# Patient Record
Sex: Female | Born: 1970
Health system: Southern US, Community
[De-identification: ages and names within clinical notes are randomized; demographics above are authoritative.]

## PROBLEM LIST (undated history)

## (undated) ENCOUNTER — Inpatient Hospital Stay (HOSPITAL_COMMUNITY): Payer: 59

## (undated) DIAGNOSIS — Z923 Personal history of irradiation: Secondary | ICD-10-CM

## (undated) DIAGNOSIS — Z800 Family history of malignant neoplasm of digestive organs: Secondary | ICD-10-CM

## (undated) DIAGNOSIS — I10 Essential (primary) hypertension: Secondary | ICD-10-CM

## (undated) DIAGNOSIS — G8929 Other chronic pain: Secondary | ICD-10-CM

## (undated) DIAGNOSIS — Z9221 Personal history of antineoplastic chemotherapy: Secondary | ICD-10-CM

## (undated) DIAGNOSIS — Z806 Family history of leukemia: Secondary | ICD-10-CM

## (undated) DIAGNOSIS — C801 Malignant (primary) neoplasm, unspecified: Secondary | ICD-10-CM

## (undated) HISTORY — DX: Essential (primary) hypertension: I10

## (undated) HISTORY — PX: TUBAL LIGATION: SHX77

## (undated) HISTORY — DX: Family history of leukemia: Z80.6

## (undated) HISTORY — PX: OTHER SURGICAL HISTORY: SHX169

## (undated) HISTORY — DX: Other chronic pain: G89.29

## (undated) HISTORY — PX: CHOLECYSTECTOMY: SHX55

## (undated) HISTORY — DX: Family history of malignant neoplasm of digestive organs: Z80.0

## (undated) HISTORY — PX: BREAST LUMPECTOMY: SHX2

---
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
## 1999-03-30 ENCOUNTER — Emergency Department (HOSPITAL_COMMUNITY): Admission: EM | Admit: 1999-03-30 | Discharge: 1999-03-30 | Payer: Self-pay

## 2001-02-23 ENCOUNTER — Other Ambulatory Visit: Admission: RE | Admit: 2001-02-23 | Discharge: 2001-02-23 | Payer: Self-pay | Admitting: Obstetrics & Gynecology

## 2002-02-26 ENCOUNTER — Inpatient Hospital Stay (HOSPITAL_COMMUNITY): Admission: AD | Admit: 2002-02-26 | Discharge: 2002-02-26 | Payer: Self-pay | Admitting: Obstetrics & Gynecology

## 2002-02-26 ENCOUNTER — Encounter: Payer: Self-pay | Admitting: Obstetrics & Gynecology

## 2002-03-01 ENCOUNTER — Other Ambulatory Visit: Admission: RE | Admit: 2002-03-01 | Discharge: 2002-03-01 | Payer: Self-pay | Admitting: Obstetrics and Gynecology

## 2002-03-01 ENCOUNTER — Other Ambulatory Visit: Admission: RE | Admit: 2002-03-01 | Discharge: 2002-03-01 | Payer: Self-pay | Admitting: Gynecology

## 2002-08-31 ENCOUNTER — Inpatient Hospital Stay (HOSPITAL_COMMUNITY): Admission: AD | Admit: 2002-08-31 | Discharge: 2002-09-02 | Payer: Self-pay | Admitting: Obstetrics & Gynecology

## 2002-08-31 ENCOUNTER — Encounter (INDEPENDENT_AMBULATORY_CARE_PROVIDER_SITE_OTHER): Payer: Self-pay

## 2003-05-14 ENCOUNTER — Other Ambulatory Visit: Admission: RE | Admit: 2003-05-14 | Discharge: 2003-05-14 | Payer: Self-pay | Admitting: Obstetrics & Gynecology

## 2004-05-27 ENCOUNTER — Other Ambulatory Visit: Admission: RE | Admit: 2004-05-27 | Discharge: 2004-05-27 | Payer: Self-pay | Admitting: Obstetrics & Gynecology

## 2005-08-25 ENCOUNTER — Other Ambulatory Visit: Admission: RE | Admit: 2005-08-25 | Discharge: 2005-08-25 | Payer: Self-pay | Admitting: Obstetrics & Gynecology

## 2010-07-11 ENCOUNTER — Ambulatory Visit: Payer: Self-pay | Admitting: Family Medicine

## 2010-07-11 DIAGNOSIS — M79609 Pain in unspecified limb: Secondary | ICD-10-CM

## 2010-07-15 ENCOUNTER — Telehealth (INDEPENDENT_AMBULATORY_CARE_PROVIDER_SITE_OTHER): Payer: Self-pay | Admitting: *Deleted

## 2010-11-12 NOTE — Assessment & Plan Note (Signed)
Summary: R Foot pain x 1 1/2 wks rm 5   Vital Signs:  Patient Profile:   40 Years Old Female CC:      R foot Pain x 1 1/2 wks Height:     62 inches Weight:      214 pounds O2 Sat:      100 % O2 treatment:    Room Air Temp:     98.8 degrees F oral Pulse rate:   73 / minute Pulse rhythm:   regular Resp:     16 per minute (left arm) Cuff size:   regular  Vitals Entered By: Areta Haber CMA (July 11, 2010 11:43 AM)                  Current Allergies: No known allergies History of Present Illness Chief Complaint: R foot Pain x 1 1/2 wks History of Present Illness:  Subjective:  Patient complains of 1.5 week history of pain in right lateral foot, worse when ambulating.  No history of trauma, change in activities, or change in foot wear.  Current Problems: FOOT PAIN, RIGHT (ICD-729.5)   Current Meds NAPROXEN 500 MG TABS (NAPROXEN) One by mouth two times a day pc  REVIEW OF SYSTEMS Constitutional Symptoms      Denies fever, chills, night sweats, weight loss, weight gain, and fatigue.  Eyes       Denies change in vision, eye pain, eye discharge, glasses, contact lenses, and eye surgery. Ear/Nose/Throat/Mouth       Denies hearing loss/aids, change in hearing, ear pain, ear discharge, dizziness, frequent runny nose, frequent nose bleeds, sinus problems, sore throat, hoarseness, and tooth pain or bleeding.  Respiratory       Denies dry cough, productive cough, wheezing, shortness of breath, asthma, bronchitis, and emphysema/COPD.  Cardiovascular       Denies murmurs, chest pain, and tires easily with exhertion.    Gastrointestinal       Denies stomach pain, nausea/vomiting, diarrhea, constipation, blood in bowel movements, and indigestion. Genitourniary       Denies painful urination, kidney stones, and loss of urinary control. Neurological       Denies paralysis, seizures, and fainting/blackouts. Musculoskeletal       Complains of decreased range of motion.       Denies muscle pain, joint pain, joint stiffness, redness, swelling, muscle weakness, and gout.      Comments: R foot pain X 1 1/2 wks Skin       Denies bruising, unusual mles/lumps or sores, and hair/skin or nail changes.  Psych       Denies mood changes, temper/anger issues, anxiety/stress, speech problems, depression, and sleep problems. Other Comments: Pt has not seen anyone for this. Pt does not have PCP. Pt states she has not injuried in any way.   Past History:  Past Medical History: Unremarkable  Past Surgical History: Denies surgical history  Social History: Never Smoked Alcohol use-yes 2-3 mthly Drug use-no Regular exercise-no Smoking Status:  never Drug Use:  no Does Patient Exercise:  no   Objective:  No acute distress  Right ankle:  Full range of motion without tenderness or swelling Right foot:  No swelling, erythema or warmth.  Full range of motion all joints.  Mild tenderness over calcaneus beneath lateral epicondyle.  Distal neurovascular intact   RIGHT FOOT COMPLETE - 3+ VIEW   Comparison: None.   Findings: No osseous abnormality the right foot.  Joint spaces are normal.  No evidence  of fracture or dislocation.  No soft tissue abnormality.   IMPRESSION: No osseous abnormality.   Assessment New Problems: FOOT PAIN, RIGHT (ICD-729.5)  SUSPECT PERONEAL TENDONITIS  Plan New Medications/Changes: NAPROXEN 500 MG TABS (NAPROXEN) One by mouth two times a day pc  #20 x 1, 07/11/2010, Donna Christen MD  New Orders: T-DG Foot Complete*R* [phone] New Patient Level III [99203] Planning Comments:   Begin Naproxen 500mg  two times a day pc.  Obtain well fitting athletic shoes with arch support Begin range of motion exercises ([employer] information and instruction patient handout given)  Follow-up with [facility] if not improving 2 to 3 weeks.   The patient and/or caregiver has been counseled thoroughly with regard to medications  prescribed including dosage, schedule, interactions, rationale for use, and possible side effects and they verbalize understanding.  Diagnoses and expected course of recovery discussed and will return if not improved as expected or if the condition worsens. Patient and/or caregiver verbalized understanding.  Prescriptions: NAPROXEN 500 MG TABS (NAPROXEN) One by mouth two times a day pc  #20 x 1   Entered and Authorized by:   Donna Christen MD   Signed by:   Donna Christen MD on 07/11/2010   Method used:   Print then Give to Patient   RxID:   [id number]   Orders Added: 1)  T-DG Foot Complete*R* [73630] 2)  New Patient Level III [62952]

## 2010-11-12 NOTE — Progress Notes (Signed)
  Phone Note Outgoing Call Call back at [facility] Phone [phone]   Call placed by: Lajean Saver RN,  July 15, 2010 3:52 PM Call placed to: Patient Summary of Call: Call back: No answer, message left with reason for call and call back with any questions

## 2010-11-16 IMAGING — CR DG FOOT COMPLETE 3+V*R*
3 series · 3 of 3 positions shown · non-contrast
Comparison: None.

CLINICAL DATA: Foot pain

RIGHT FOOT COMPLETE - 3+ VIEW

[view not recorded (1 of 3)]
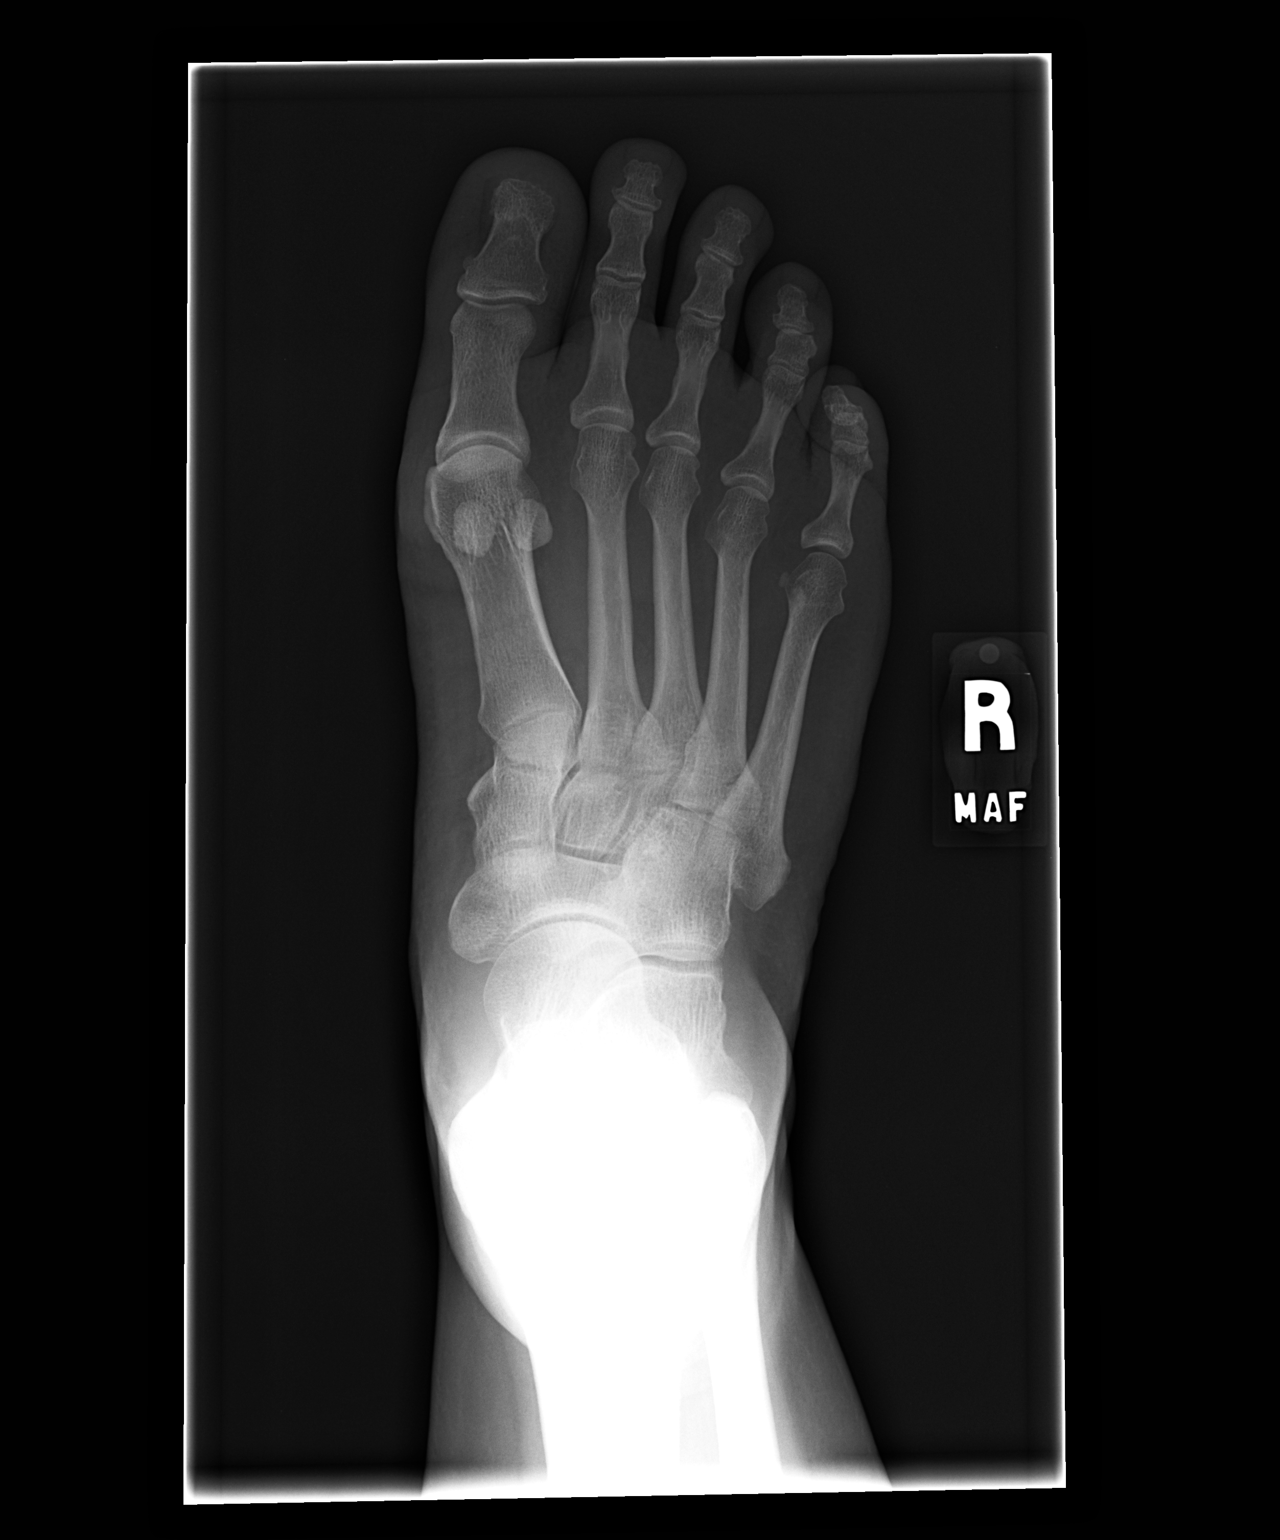

[view not recorded (2 of 3)]
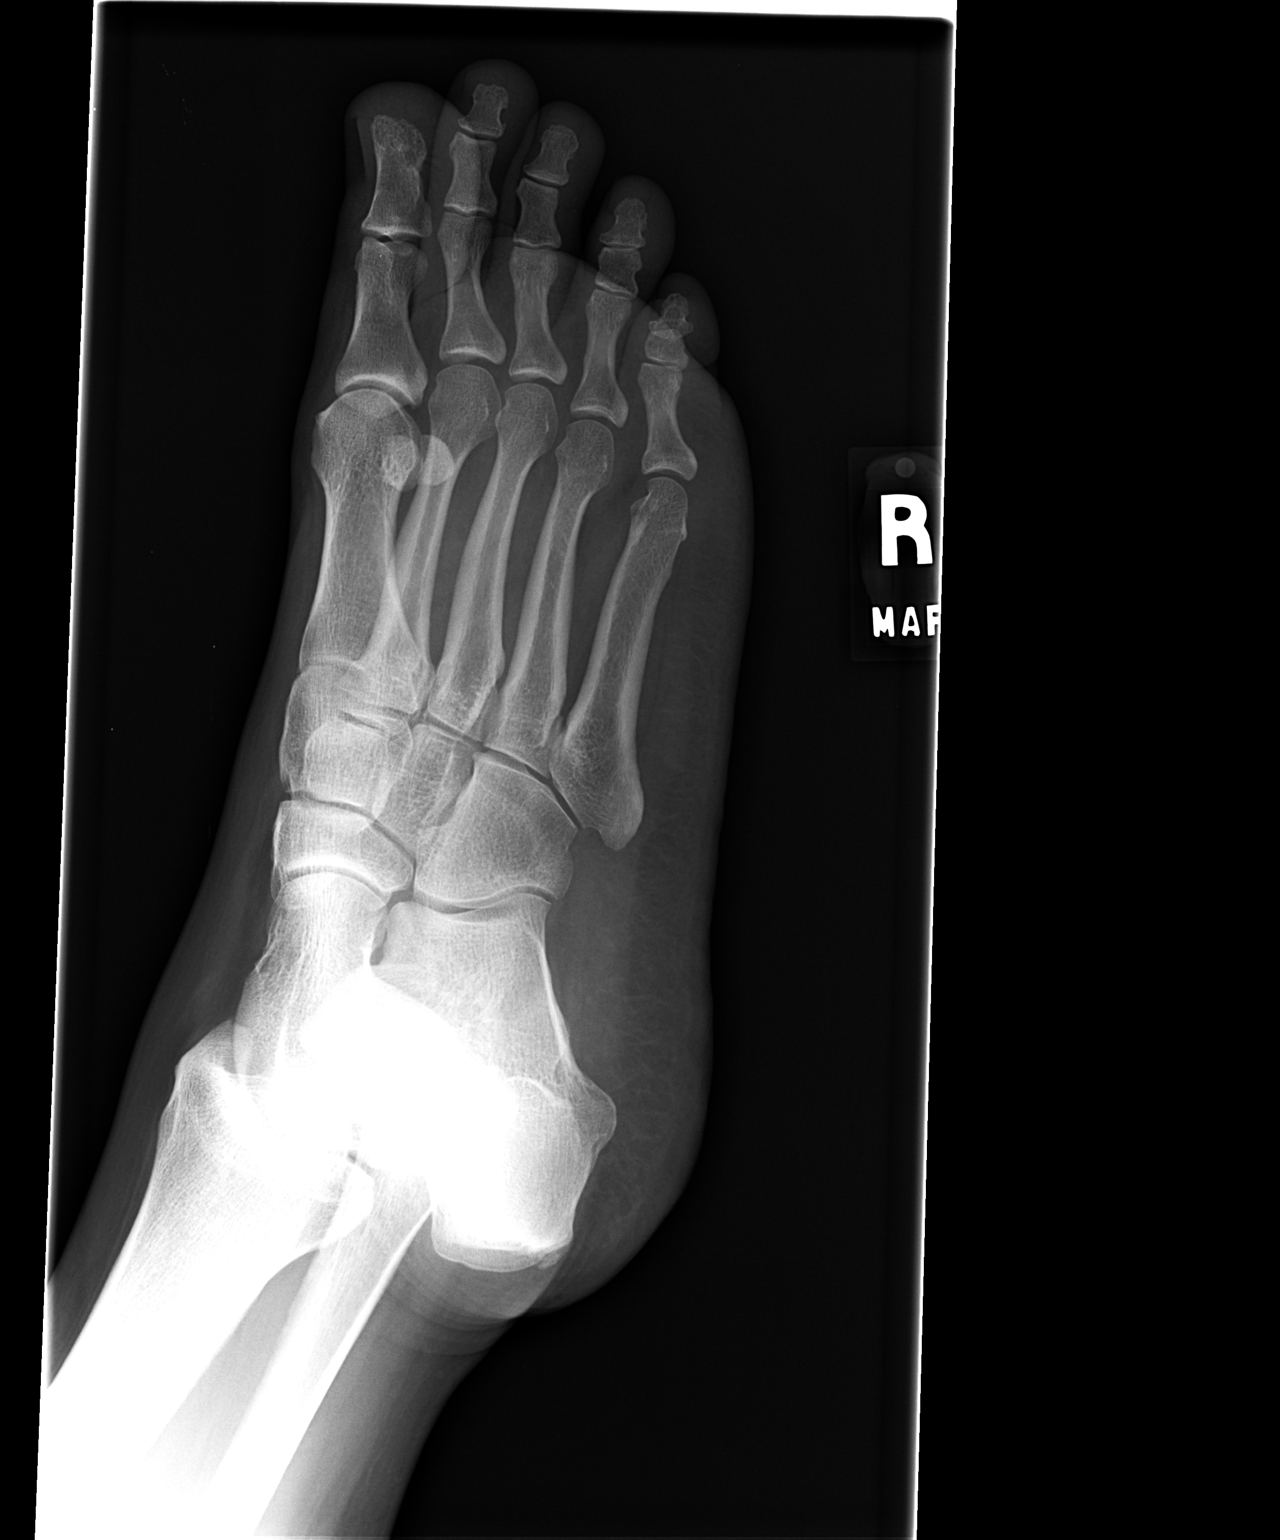

[view not recorded (3 of 3)]
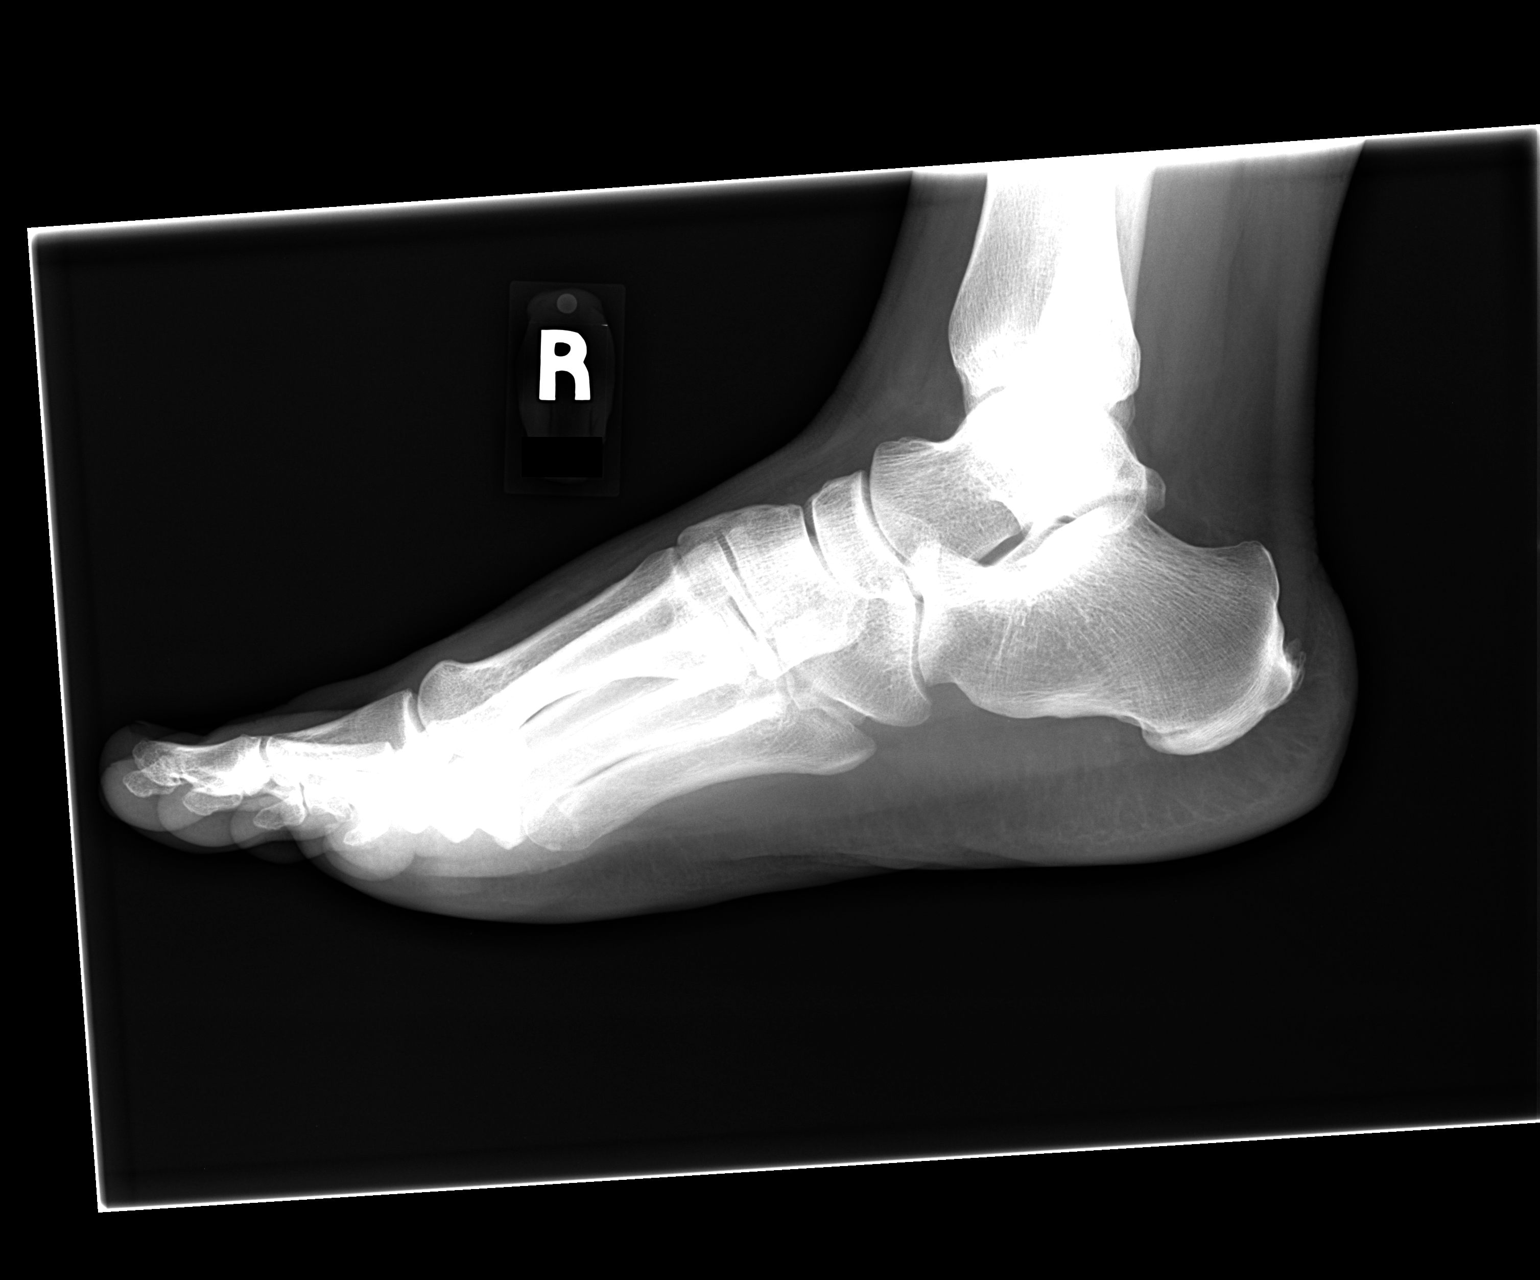

[3 of 3 positions shown; findings below may reference images not displayed]

FINDINGS: No osseous abnormality the right foot.  Joint spaces are
normal.  No evidence of fracture or dislocation.  No soft tissue
abnormality.
IMPRESSION: No osseous abnormality.

## 2011-02-26 NOTE — Discharge Summary (Signed)
   NAME:  Veronica [name], Veronica [name]                          ACCOUNT NO.:  [id number]   MEDICAL RECORD NO.:  [id number]                   PATIENT TYPE:  INP   LOCATION:  9106                                 FACILITY:  [facility]   PHYSICIAN:  Ilda Mori, M.D.                DATE OF BIRTH:  April 02, 1971   DATE OF ADMISSION:  08/31/2002  DATE OF DISCHARGE:  09/02/2002                                 DISCHARGE SUMMARY   FINAL DIAGNOSES:  1. Intrauterine pregnancy at term.  2. History of previous cesarean section and desires repeat cesarean section.  3. Desire for permanent elective sterilization.   PROCEDURE:  Repeat low transverse cesarean section and tubal sterilization  procedure.   SURGEON:  Gerrit Friends. Aldona Bar, M.D.   ASSISTANT:  Carrington Clamp, M.D.   COMPLICATIONS:  None.   HISTORY OF PRESENT ILLNESS:  This 40 year old G4, P0-1-1-1 presents at term  for repeat cesarean section.  The patient had had a history of a repeat  cesarean section performed with her past pregnancy and desires repeat with  this pregnancy, also desires permanent sterilization.   HOSPITAL COURSE:  The patient was taken to the operating room by Dr. Annamaria Helling where a repeat low transverse cesarean section was performed with  delivery of an 8 pound 7 ounce female infant with Apgars of 9 and 9.  At  this point, bilateral tubal ligation was performed.  Delivery and the tubal  ligation went without complications.  The patient's postoperative course was  benign without significant fevers.  The patient was felt ready for discharge  on postoperative day #2.   DIET:  She was sent home on a regular diet.   ACTIVITY:  She was told to decrease activities.   DISCHARGE MEDICATIONS:  She was told to continue prenatal vitamins.  She was  given a prescription for Tylox #25 one to two every four hours as needed for  pain.  She was told to also use some over-the-counter pain medicines.   FOLLOW UP:  She was to follow up in the  office in four weeks.   Upon discharge, the patient's hemoglobin was 12.5.     Leilani Able, P.A.-C.                Ilda Mori, M.D.    MB/MEDQ  D:  10/08/2002  T:  10/08/2002  Job:  [id number]

## 2011-02-26 NOTE — Op Note (Signed)
NAME:  Veronica [name], Veronica [name]                          ACCOUNT NO.:  [id number]   MEDICAL RECORD NO.:  [id number]                   PATIENT TYPE:  INP   LOCATION:  NA                                   FACILITY:  [facility]   PHYSICIAN:  Gerrit Friends. Aldona Bar, M.D.                DATE OF BIRTH:  [birth year]   DATE OF PROCEDURE:  08/31/2002  DATE OF DISCHARGE:                                 OPERATIVE REPORT   PREOPERATIVE DIAGNOSES:  1. Term pregnancy.  2. Previous cesarean section.  3. Desire for repeat cesarean section.  4. Desire for permanent elective sterilization.   POSTOPERATIVE DIAGNOSES:  1. Term pregnancy.  2. Previous cesarean section.  3. Desire for repeat cesarean section.  4. Desire for permanent elective sterilization.  5. Delivery of 8 pound 7 ounce female infant, Apgars 9 and 9.  6. Pathology pending on segment of each fallopian tube.   PROCEDURE:  1. Repeat low transverse cesarean section.  2. Tubal sterilization procedure.   SURGEON:  Gerrit Friends. Aldona Bar, M.D.   ASSISTANT:  Carrington Clamp, M.D.   ANESTHESIA:  Subarachnoid block.   PROCEDURE:  The patient was taken to the operating room where after a  satisfactory induction of subarachnoid block the patient was prepped and  draped having been placed in the supine position slightly tilted to the  left.  A Foley catheter was inserted as part of the prep.   After good documentation of anesthetic levels, a Pfannenstiel incision was  made, dissected down sharply to and through the fascia in a low transverse  fashion with hemostasis created at each layer.  Subfascial space was created  inferiorly and superiorly and muscles separated in the midline, peritoneum  identified and entered appropriately with care taken to avoid the bowel  superiorly and the bladder inferiorly.  There were some filmy adhesions to  the anterior surface of the uterus.   The vesicouterine peritoneum was entered appropriately and the bladder was  pushed off  the lower uterine segment with ease.  Sharp incision into the  lower segment in a low transverse fashion was then made with Metzenbaum  scissors and extended laterally with the fingers.  Amniotomy was produced,  clear fluid, and thereafter delivery of a viable female infant which weighed  subsequently 8 pounds 7 ounces was delivered from a vertex position.  After  the cord was clamped and cut the infant was passed off to the awaiting team.  Subsequent Apgars were noted to be 9 and 9.   Placenta was thereafter delivered intact.  Placenta appeared grossly normal.   At this time the uterus was exteriorized after the filmy adhesions were  lysed.  Once the uterus was exteriorized, was rendered free of any remaining  products of conception.  Good uterine contractility was afforded with slowly  given intravenous Pitocin and manual stimulation.   At this time closure of the uterus  was begun.  The uterine incision was  closed in a single layer of number 1 Vicryl in a running locking fashion  beginning at both angles to the midline.  Hemostasis was noted to be  adequate.  At this time attention was turned to each fallopian tube and  ovary, both of which appeared normal as well.   A segment of the right fallopian tube in its mid portion was elevated and a  free tie of number 1 plain catgut suture tied down about the knuckle and the  knuckle was excised and sent to pathology.  A similar procedure was carried  out on the left fallopian tube.  However, some bleeding was encountered and  this was stopped with a suture of 2-0 Vicryl placed about the bleeding area  on both sides.   At this time the uterus was replaced into the abdominal incision.  The tubal  ligation sites rechecked and noted to be dry.  Uterine incision itself noted  to be dry.  The abdomen was lavaged of all free blood and clot and then  closure of the abdomen was begun in layers.  The abdominoperitoneum was  reapproximated with 0  Vicryl in a running fashion and muscles were secured  with same.   Assured of good subfascial hemostasis, the fascia was reapproximated using 0  Vicryl from angle to midline bilaterally.  Subcutaneous tissue was rendered  hemostatic and then reapproximated with 2-0 plain in interrupted  subcuticular fashion.  Staples were then applied and a sterile pressure  dressing afterwards.  The patient at this time was transported to recovery  room in satisfactory condition having tolerated procedure well.  Estimated  blood loss 800 cc.  All counts correct x2.  Urine output was noted to be  clear and of adequate volume during the procedure.   Once in the recovery room patient was stable.  All counts correct x2.  Pathologic specimen consisted of segment of each fallopian tube.  Baby was  doing well in the nursery.                                               Gerrit Friends. Aldona Bar, M.D.    RMW/MEDQ  D:  08/31/2002  T:  08/31/2002  Job:  [id number]

## 2011-03-09 ENCOUNTER — Other Ambulatory Visit: Payer: Self-pay | Admitting: Obstetrics & Gynecology

## 2011-03-19 ENCOUNTER — Inpatient Hospital Stay (INDEPENDENT_AMBULATORY_CARE_PROVIDER_SITE_OTHER)
Admission: RE | Admit: 2011-03-19 | Discharge: 2011-03-19 | Disposition: A | Discharge status: Home / Self Care | Payer: 59 | Source: Ambulatory Visit | Attending: Family Medicine | Admitting: Family Medicine

## 2011-03-19 ENCOUNTER — Encounter: Payer: Self-pay | Admitting: Family Medicine

## 2011-03-19 DIAGNOSIS — B356 Tinea cruris: Secondary | ICD-10-CM

## 2011-05-10 ENCOUNTER — Encounter (HOSPITAL_COMMUNITY)
Admission: RE | Admit: 2011-05-10 | Discharge: 2011-05-10 | Disposition: A | Discharge status: Home / Self Care | Payer: 59 | Source: Ambulatory Visit | Attending: Obstetrics & Gynecology | Admitting: Obstetrics & Gynecology

## 2011-05-10 ENCOUNTER — Encounter (HOSPITAL_COMMUNITY): Payer: Self-pay

## 2011-05-10 LAB — SURGICAL PCR SCREEN
MRSA, PCR: NEGATIVE
Staphylococcus aureus: NEGATIVE

## 2011-05-10 LAB — CBC
HCT: 39.2 % (ref 36.0–46.0)
Hemoglobin: 12.7 g/dL (ref 12.0–15.0)
MCH: 23.2 pg — ABNORMAL LOW (ref 26.0–34.0)
MCHC: 32.4 g/dL (ref 30.0–36.0)
MCV: 71.7 fL — ABNORMAL LOW (ref 78.0–100.0)
Platelets: 242 K/uL (ref 150–400)
RBC: 5.47 MIL/uL — ABNORMAL HIGH (ref 3.87–5.11)
RDW: 16.5 % — ABNORMAL HIGH (ref 11.5–15.5)
WBC: 8.5 K/uL (ref 4.0–10.5)

## 2011-05-10 NOTE — Anesthesia Preprocedure Evaluation (Signed)
Anesthesia Evaluation  Name, MR# and DOB Patient awake  General Assessment Comment  Reviewed: Allergy & Precautions, H&P , Patient's Chart, lab work & pertinent test results and reviewed documented beta blocker date and time   History of Anesthesia Complications Negative for: history of anesthetic complications  Airway Mallampati: II TM Distance: >3 FB Neck ROM: full    Dental No notable dental hx    Pulmonaryneg pulmonary ROS    clear to auscultation  pulmonary exam normal   Cardiovascular Exercise Tolerance: Good regular Normal   Neuro/PsychNegative Neurological ROS Negative Psych ROS  GI/Hepatic/Renal negative GI ROS, negative Liver ROS, and negative Renal ROS (+)       Endo/Other  Negative Endocrine ROS (+)   Abdominal   Musculoskeletal  Hematology negative hematology ROS (+)   Peds  Reproductive/Obstetrics negative OB ROS   Anesthesia Other Findings             Anesthesia Physical Anesthesia Plan  ASA: I  Anesthesia Plan: General   Post-op Pain Management:    Induction:   Airway Management Planned:   Additional Equipment:   Intra-op Plan:   Post-operative Plan:   Informed Consent: I have reviewed the patients History and Physical, chart, labs and discussed the procedure including the risks, benefits and alternatives for the proposed anesthesia with the patient or authorized representative who has indicated his/her understanding and acceptance.   Dental Advisory Given  Plan Discussed with: CRNA and Surgeon  Anesthesia Plan Comments:         Anesthesia Quick Evaluation  

## 2011-05-10 NOTE — Patient Instructions (Addendum)
20 NEKITA PITA  05/10/2011   Your procedure is scheduled on:  05/17/11  Report to [facility] at 6 AM.  Call this number if you have problems the morning of surgery: [phone]   Remember:   Do not eat food:After Midnight.  Do not drink clear liquids: After Midnight.  Take these medicines the morning of surgery with A SIP OF WATER: NA   Do not wear jewelry, make-up or nail polish.  Do not bring valuables to the hospital.  Contacts, dentures or bridgework may not be worn into surgery.  Leave suitcase in the car. After surgery it may be brought to your room.  For patients admitted to the hospital, checkout time is 11:00 AM the day of discharge.   Patients discharged the day of surgery will not be allowed to drive home.  Name and phone number of your driver: NA  Special Instructions: use CHG wash per written instructions   Please read over the following fact sheets that you were given: Pain Booklet and MRSA Information

## 2011-05-16 ENCOUNTER — Encounter (HOSPITAL_COMMUNITY): Payer: Self-pay | Admitting: Obstetrics & Gynecology

## 2011-05-16 NOTE — H&P (Signed)
Veronica [name] is an 40 y.o. female. G4 Z6109 who is admitted for dysmenorrhea.  She has pain with menses for 5 days each cycle that does not respond to oral contraceptives or NASAIDs and has affected her work and quality of life for the last 6 months.  She is not concerned with fertility (s/p BTL) and wishes to proceed with vaginal hysterectomy   Pertinent Gynecological History: Menses: regular every month without intermenstrual spotting and with severe dysmenorrhea Contraception: tubal ligation DES exposure: denies Blood transfusions: none Sexually transmitted diseases: no past history Previous GYN Procedures: Cesarean x2  Last pap: normal Date: May 2012 OB History: G4, P4022   History reviewed. No pertinent past medical history.  Past Surgical History  Procedure Date  . Cholecystectomy   . Tubal ligation   . C/s x2 '98 '03    History reviewed. No pertinent family history.  Social History:  reports that she has never smoked. She does not have any smokeless tobacco history on file. She reports that she drinks alcohol. She reports that she does not use illicit drugs.  Allergies: No Known Allergies  No prescriptions prior to admission    Review of Systems  All other systems reviewed and are negative.    Ht. 5'1"    Wt. 186    BP 124/76 Physical Exam  Constitutional: She appears well-developed.  HENT:  Head: Normocephalic.  Eyes: Pupils are equal, round, and reactive to light.  Neck: Normal range of motion. No thyromegaly present.  Cardiovascular: Normal rate and normal heart sounds.   Respiratory: Breath sounds normal.  GI: Soft.  Genitourinary: Vagina normal and uterus normal.  Neurological: She is alert.   Assessment/Plan: Severe dysmenorrhea, I suspect adenomyosis.  After failure of non surgical therapies, and consultation about the risks and benefits, the patient requests to proceed with hysterectomy and I concur.  Because of her history of cesarean sections x2 and  her pelvic pain, I will perform laparoscopy prior to vaginal hysterectomy to identify and correct any adhesive disease that will complicate the surgery.  Dontavis Tschantz D 05/16/2011, 9:14 PM

## 2011-05-17 ENCOUNTER — Ambulatory Visit (HOSPITAL_COMMUNITY)
Admission: RE | Admit: 2011-05-17 | Discharge: 2011-05-18 | Disposition: A | Discharge status: Home / Self Care | Payer: 59 | Source: Ambulatory Visit | Attending: Obstetrics & Gynecology | Admitting: Obstetrics & Gynecology

## 2011-05-17 ENCOUNTER — Encounter (HOSPITAL_COMMUNITY)
Admission: RE | Disposition: A | Discharge status: Home / Self Care | Payer: Self-pay | Source: Ambulatory Visit | Attending: Obstetrics & Gynecology

## 2011-05-17 ENCOUNTER — Encounter (HOSPITAL_COMMUNITY): Payer: Self-pay | Admitting: Anesthesiology

## 2011-05-17 ENCOUNTER — Other Ambulatory Visit: Payer: Self-pay | Admitting: Obstetrics & Gynecology

## 2011-05-17 DIAGNOSIS — Z01812 Encounter for preprocedural laboratory examination: Secondary | ICD-10-CM | POA: Insufficient documentation

## 2011-05-17 DIAGNOSIS — N8300 Follicular cyst of ovary, unspecified side: Secondary | ICD-10-CM | POA: Insufficient documentation

## 2011-05-17 DIAGNOSIS — Z01818 Encounter for other preprocedural examination: Secondary | ICD-10-CM | POA: Insufficient documentation

## 2011-05-17 DIAGNOSIS — Z90710 Acquired absence of both cervix and uterus: Secondary | ICD-10-CM

## 2011-05-17 DIAGNOSIS — D259 Leiomyoma of uterus, unspecified: Secondary | ICD-10-CM | POA: Insufficient documentation

## 2011-05-17 DIAGNOSIS — N946 Dysmenorrhea, unspecified: Secondary | ICD-10-CM | POA: Insufficient documentation

## 2011-05-17 HISTORY — PX: LAPAROSCOPIC ASSISTED VAGINAL HYSTERECTOMY: SHX5398

## 2011-05-17 SURGERY — HYSTERECTOMY, VAGINAL, LAPAROSCOPY-ASSISTED
Anesthesia: General | Site: Abdomen | Wound class: Clean Contaminated

## 2011-05-17 MED ORDER — CEFAZOLIN SODIUM 1-5 GM-% IV SOLN
INTRAVENOUS | Status: DC | PRN
  Administered 2011-05-17: 1 g via INTRAVENOUS

## 2011-05-17 MED ORDER — KETOROLAC TROMETHAMINE 30 MG/ML IJ SOLN
30.0000 mg | Freq: Four times a day (QID) | INTRAMUSCULAR | Status: AC
  Administered 2011-05-17 (×2): 30 mg via INTRAVENOUS
  Filled 2011-05-17 (×2): qty 1

## 2011-05-17 MED ORDER — SIMETHICONE 80 MG PO CHEW: 80.0000 mg | CHEWABLE_TABLET | Freq: Four times a day (QID) | ORAL | Status: DC | PRN

## 2011-05-17 MED ORDER — CEFAZOLIN SODIUM 1-5 GM-% IV SOLN: 1.0000 g | INTRAVENOUS | Status: DC

## 2011-05-17 MED ORDER — LACTATED RINGERS IV SOLN
INTRAVENOUS | Status: DC
  Administered 2011-05-17: 07:00:00 via INTRAVENOUS

## 2011-05-17 MED ORDER — MENTHOL 3 MG MT LOZG: 1.0000 | LOZENGE | OROMUCOSAL | Status: DC | PRN

## 2011-05-17 MED ORDER — IBUPROFEN 800 MG PO TABS
800.0000 mg | ORAL_TABLET | Freq: Three times a day (TID) | ORAL | Status: DC | PRN
  Administered 2011-05-18: 800 mg via ORAL
  Filled 2011-05-17: qty 1

## 2011-05-17 MED ORDER — GLYCOPYRROLATE 0.2 MG/ML IJ SOLN
INTRAMUSCULAR | Status: DC | PRN
  Administered 2011-05-17: .2 mg via INTRAVENOUS

## 2011-05-17 MED ORDER — SENNOSIDES-DOCUSATE SODIUM 8.6-50 MG PO TABS: 2.0000 | ORAL_TABLET | Freq: Two times a day (BID) | ORAL | Status: DC | PRN

## 2011-05-17 MED ORDER — NEOSTIGMINE METHYLSULFATE 1 MG/ML IJ SOLN
INTRAMUSCULAR | Status: DC | PRN
  Administered 2011-05-17: 3 mg via INTRAMUSCULAR

## 2011-05-17 MED ORDER — MIDAZOLAM HCL 5 MG/5ML IJ SOLN
INTRAMUSCULAR | Status: DC | PRN
  Administered 2011-05-17: 2 mg via INTRAVENOUS

## 2011-05-17 MED ORDER — GUAIFENESIN 100 MG/5ML PO SOLN: 15.0000 mL | ORAL | Status: DC | PRN

## 2011-05-17 MED ORDER — HYDROMORPHONE HCL 1 MG/ML IJ SOLN: 0.2500 mg | INTRAMUSCULAR | Status: DC | PRN

## 2011-05-17 MED ORDER — LIDOCAINE HCL (CARDIAC) 20 MG/ML IV SOLN
INTRAVENOUS | Status: DC | PRN
  Administered 2011-05-17: 80 mg via INTRAVENOUS

## 2011-05-17 MED ORDER — BUPIVACAINE-EPINEPHRINE 0.5% -1:200000 IJ SOLN
INTRAMUSCULAR | Status: DC | PRN
  Administered 2011-05-17: 8 mL

## 2011-05-17 MED ORDER — ONDANSETRON HCL 4 MG/2ML IJ SOLN: 4.0000 mg | Freq: Four times a day (QID) | INTRAMUSCULAR | Status: DC | PRN

## 2011-05-17 MED ORDER — PSEUDOEPHEDRINE HCL 30 MG PO TABS: 30.0000 mg | ORAL_TABLET | ORAL | Status: DC | PRN

## 2011-05-17 MED ORDER — PROPOFOL 10 MG/ML IV EMUL
INTRAVENOUS | Status: DC | PRN
  Administered 2011-05-17: 200 mg via INTRAVENOUS

## 2011-05-17 MED ORDER — LACTATED RINGERS IR SOLN
Status: DC | PRN
  Administered 2011-05-17: 300 mL

## 2011-05-17 MED ORDER — HYDROMORPHONE HCL 1 MG/ML IJ SOLN
INTRAMUSCULAR | Status: DC | PRN
  Administered 2011-05-17: 1 mg via INTRAVENOUS

## 2011-05-17 MED ORDER — TEMAZEPAM 15 MG PO CAPS: 30.0000 mg | ORAL_CAPSULE | Freq: Every evening | ORAL | Status: DC | PRN

## 2011-05-17 MED ORDER — KETOROLAC TROMETHAMINE 30 MG/ML IJ SOLN
15.0000 mg | Freq: Once | INTRAMUSCULAR | Status: AC | PRN
  Administered 2011-05-17: 30 mg via INTRAVENOUS

## 2011-05-17 MED ORDER — LACTATED RINGERS IV SOLN: INTRAVENOUS | Status: DC

## 2011-05-17 MED ORDER — LACTATED RINGERS IV SOLN
INTRAVENOUS | Status: DC | PRN
  Administered 2011-05-17 (×3): via INTRAVENOUS

## 2011-05-17 MED ORDER — ROCURONIUM BROMIDE 100 MG/10ML IV SOLN
INTRAVENOUS | Status: DC | PRN
  Administered 2011-05-17: 10 mg via INTRAVENOUS
  Administered 2011-05-17: 40 mg via INTRAVENOUS

## 2011-05-17 MED ORDER — ONDANSETRON HCL 4 MG/2ML IJ SOLN
INTRAMUSCULAR | Status: DC | PRN
  Administered 2011-05-17: 4 mg via INTRAVENOUS

## 2011-05-17 MED ORDER — DEXAMETHASONE SODIUM PHOSPHATE 4 MG/ML IJ SOLN
INTRAMUSCULAR | Status: DC | PRN
  Administered 2011-05-17: 10 mg via INTRAVENOUS

## 2011-05-17 MED ORDER — FENTANYL CITRATE 0.05 MG/ML IJ SOLN
INTRAMUSCULAR | Status: DC | PRN
  Administered 2011-05-17: 50 ug via INTRAVENOUS
  Administered 2011-05-17 (×2): 100 ug via INTRAVENOUS

## 2011-05-17 MED ORDER — OXYCODONE-ACETAMINOPHEN 5-325 MG PO TABS
1.0000 | ORAL_TABLET | ORAL | Status: DC | PRN
  Administered 2011-05-17: 1 via ORAL
  Administered 2011-05-17: 2 via ORAL
  Administered 2011-05-17: 1 via ORAL
  Administered 2011-05-18 (×2): 2 via ORAL
  Filled 2011-05-17 (×3): qty 2
  Filled 2011-05-17 (×2): qty 1

## 2011-05-17 SURGICAL SUPPLY — 38 items
APPLIER CLIP ROT 10 11.4 M/L (STAPLE)
CABLE HIGH FREQUENCY MONO STRZ (ELECTRODE) ×2 IMPLANT
CATH ROBINSON RED A/P 16FR (CATHETERS) ×2 IMPLANT
CLIP APPLIE ROT 10 11.4 M/L (STAPLE) IMPLANT
CLOTH BEACON ORANGE TIMEOUT ST (SAFETY) ×2 IMPLANT
CONT PATH 16OZ SNAP LID 3702 (MISCELLANEOUS) ×2 IMPLANT
COVER TABLE BACK 60X90 (DRAPES) ×2 IMPLANT
DECANTER SPIKE VIAL GLASS SM (MISCELLANEOUS) ×2 IMPLANT
DERMABOND ADVANCED (GAUZE/BANDAGES/DRESSINGS) ×2 IMPLANT
DRSG COVADERM PLUS 2X2 (GAUZE/BANDAGES/DRESSINGS) ×2 IMPLANT
ELECT LIGASURE SHORT 9 REUSE (ELECTRODE) ×2 IMPLANT
ELECT REM PT RETURN 9FT ADLT (ELECTROSURGICAL) ×2
ELECTRODE REM PT RTRN 9FT ADLT (ELECTROSURGICAL) ×1 IMPLANT
FORCEPS CUTTING 33CM 5MM (CUTTING FORCEPS) IMPLANT
GLOVE BIOGEL PI IND STRL 6.5 (GLOVE) ×1 IMPLANT
GLOVE BIOGEL PI INDICATOR 6.5 (GLOVE) ×1
GLOVE ECLIPSE 6.0 STRL STRAW (GLOVE) ×4 IMPLANT
GLOVE ECLIPSE 6.5 STRL STRAW (GLOVE) ×2 IMPLANT
GOWN PREVENTION PLUS LG XLONG (DISPOSABLE) ×8 IMPLANT
NEEDLE SPNL 18GX3.5 QUINCKE PK (NEEDLE) ×2 IMPLANT
NS IRRIG 1000ML POUR BTL (IV SOLUTION) ×2 IMPLANT
PACK LAVH (CUSTOM PROCEDURE TRAY) ×2 IMPLANT
SET IRRIG TUBING LAPAROSCOPIC (IRRIGATION / IRRIGATOR) ×2 IMPLANT
SLEEVE Z-THREAD 5X100MM (TROCAR) ×4 IMPLANT
SOLUTION ELECTROLUBE (MISCELLANEOUS) IMPLANT
STRIP CLOSURE SKIN 1/4X3 (GAUZE/BANDAGES/DRESSINGS) IMPLANT
SUT MNCRL 0 MO-4 VIOLET 18 CR (SUTURE) ×3 IMPLANT
SUT MNCRL 0 VIOLET 6X18 (SUTURE) ×1 IMPLANT
SUT MONOCRYL 0 6X18 (SUTURE) ×1
SUT MONOCRYL 0 MO 4 18  CR/8 (SUTURE) ×3
SUT VIC AB 0 CT1 27 (SUTURE) ×2
SUT VIC AB 0 CT1 27XBRD ANBCTR (SUTURE) ×2 IMPLANT
SUT VICRYL 0 UR6 27IN ABS (SUTURE) IMPLANT
SUT VICRYL 4-0 PS2 18IN ABS (SUTURE) ×6 IMPLANT
TOWEL OR 17X24 6PK STRL BLUE (TOWEL DISPOSABLE) ×4 IMPLANT
TRAY FOLEY CATH 14FR (SET/KITS/TRAYS/PACK) ×2 IMPLANT
TROCAR Z-THREAD BLADED 5X100MM (TROCAR) ×2 IMPLANT
WATER STERILE IRR 1000ML POUR (IV SOLUTION) ×2 IMPLANT

## 2011-05-17 NOTE — Anesthesia Postprocedure Evaluation (Signed)
  Anesthesia Post-op Note  Patient: Veronica [name]  Procedure(s) Performed:  LAPAROSCOPIC ASSISTED VAGINAL HYSTERECTOMY - Laparoscopic Assisted Vaginal Hysterectomy With Lysis Of Adhesions  Patient Location: PACU and Women's Unit  Anesthesia Type: General  Level of Consciousness: awake, alert , oriented and patient cooperative  Airway and Oxygen Therapy: Patient Spontanous Breathing  Post-op Pain: pt complained of discomfort left upper thigh, improved with ambulation and massaging, nurse to apply heat.   Post-op Assessment: Post-op Vital signs reviewed, Patient's Cardiovascular Status Stable, No signs of Nausea or vomiting, Adequate PO intake and Pain level controlled  Post-op Vital Signs: Reviewed and stable  Complications: No apparent anesthesia complications

## 2011-05-17 NOTE — Anesthesia Postprocedure Evaluation (Signed)
  Anesthesia Post-op Note  Patient: Veronica [name]  Procedure(s) Performed:  LAPAROSCOPIC ASSISTED VAGINAL HYSTERECTOMY - Laparoscopic Assisted Vaginal Hysterectomy With Lysis Of Adhesions  No anesthesia complications.  Level of consciousness: alert. Cardiopulmonary status stable.  No follow-up care or observation required.  Aliyana Dlugosz L. Rodman Pickle, MD

## 2011-05-17 NOTE — Brief Op Note (Addendum)
        05/17/2011  9:46 AM  PATIENT:  Veronica [name]  40 y.o. female  PRE-OPERATIVE DIAGNOSIS:  dysmenorrhea;possible adenomyosis  POST-OPERATIVE DIAGNOSIS:  Dysmenorrhea; Pelvic adhesions; Possible Adenomyosis  PROCEDURE:  Procedure(s): LAPAROSCOPIC ASSISTED VAGINAL HYSTERECTOMY  SURGEON:  Surgeon(s): Mickel Baas  PHYSICIAN ASSISTANT: Luvenia Redden   ANESTHESIA:   general  ESTIMATED BLOOD LOSS: 200 ml   BLOOD ADMINISTERED:none  DRAINS: none   LOCAL MEDICATIONS USED:  MARCAINE 1:200,000 epi 10 CC  SPECIMEN:  Source of Specimen:  Uterus and Cervix  DISPOSITION OF SPECIMEN:  PATHOLOGY  COUNTS:  YES  TOURNIQUET:  * No tourniquets in log *  DICTATION #: [id number]  PLAN OF CARE: Admit for post op observation  PATIENT DISPOSITION:  PACU - hemodynamically stable.   Delay start of Pharmacological VTE agent (>24hrs) due to surgical blood loss or risk of bleeding:  not applicable

## 2011-05-17 NOTE — Transfer of Care (Signed)
Immediate Anesthesia Transfer of Care Note  Patient: Veronica [name]  Procedure(s) Performed:  LAPAROSCOPIC ASSISTED VAGINAL HYSTERECTOMY - Laparoscopic Assisted Vaginal Hysterectomy With Lysis Of Adhesions  Patient Location: PACU  Anesthesia Type: General  Level of Consciousness: sedated and patient cooperative  Airway & Oxygen Therapy: Patient Spontanous Breathing and Patient connected to nasal cannula oxygen  Post-op Assessment: Report given to PACU RN  Post vital signs: stable  Complications: No apparent anesthesia complications

## 2011-05-17 NOTE — Progress Notes (Signed)
Subjective: Patient reports tolerating PO.    Objective: I have reviewed patient's vital signs and intake and output.  GI: soft, non-tender; bowel sounds normal; no masses,  no organomegaly Vaginal Bleeding: minimal   Assessment/Plan: Observe, Consider discharge tomorrow if stable  LOS: 0 days    Oline Belk D 05/17/2011, 4:59 PM

## 2011-05-18 LAB — CBC
MCH: 23.3 pg — ABNORMAL LOW (ref 26.0–34.0)
MCHC: 32.2 g/dL (ref 30.0–36.0)
Platelets: 187 10*3/uL (ref 150–400)
RBC: 4.86 MIL/uL (ref 3.87–5.11)

## 2011-05-18 MED ORDER — OXYCODONE-ACETAMINOPHEN 5-325 MG PO TABS: 1.0000 | ORAL_TABLET | ORAL | Status: AC | PRN

## 2011-05-18 NOTE — Discharge Summary (Signed)
Physician Discharge Summary  Patient ID: Veronica [name] MRN: [id number] DOB/AGE: 38-Jul-1972 37 y.o.  Admit date: 05/17/2011 Discharge date: 05/18/2011  Admission Diagnoses: Dysmenorrhea  Discharge Diagnoses: Active Problems:  S/P laparoscopic assisted vaginal hysterectomy (LAVH)   Discharged Condition: good  Hospital Course: Parous female, s/p cesarean section x2, s/p tubal sterilization, who suffered with severe dysmenorrhea not responsive to conservative therapies.  Admitted for LAVH.  Surgery and post op course were uncomplicated.  Ready for discharge 24 hours post op.  Consults: none  Significant Diagnostic Studies: Path: pending, Hb. 11.3, WBC 8.8 post op.  Treatments: surgery: LA VH and lysis of uterine adhesions.  Discharge Exam: Blood pressure 138/87, pulse 61, temperature 98.9 F (37.2 C), temperature source Oral, resp. rate 20, height 5\' 2"  (1.575 m), weight 83.915 kg (185 lb), SpO2 100.00%. General appearance: alert and no distress GI: soft, non-tender; bowel sounds normal; no masses,  no organomegaly Incision/Wound:uninflammed  Disposition:   Discharge Orders    Future Orders Please Complete By Expires   Discharge patient        Current Discharge Medication List    START taking these medications   Details  oxyCODONE-acetaminophen (PERCOCET) 5-325 MG per tablet Take 1-2 tablets by mouth every 4 (four) hours as needed (moderate to severe pain when tolerating fluids). Qty: 30 tablet, Refills: 0       Follow-up Information    Follow up with Anquanette Bahner D. Make an appointment in 3 weeks.   Contact information:   [address] [place] [address] [phone]          Signed: Mickel Baas 05/18/2011, 12:53 PM

## 2011-05-18 NOTE — Op Note (Signed)
[name], [name] NO.:  [id number]  MEDICAL RECORD NO.:  [id number]  LOCATION:  9319                          FACILITY:  [facility]  PHYSICIAN:  Ilda Mori, M.D.   DATE OF BIRTH:  12-17-1970  DATE OF PROCEDURE:  05/17/2011 DATE OF DISCHARGE:                              OPERATIVE REPORT   PREOPERATIVE DIAGNOSIS:  Dysmenorrhea.  POSTOPERATIVE DIAGNOSES:  Myoma uteri, uterine adhesions, dysmenorrhea.  PROCEDURE:  Laparoscopically assisted vaginal hysterectomy.  SURGEON:  Ilda Mori, MD  ASSISTANT:  Luvenia Redden, .D  ANESTHESIA:  General endotracheal.  ESTIMATED BLOOD LOSS:  200 mL.  FINDINGS:  Fibrous adhesions from the anterior lower uterine segment to the anterior abdominal wall, enlarged uterus, normal-appearing adnexa bilaterally status post bilateral tubal ligation.  COMPLICATIONS:  None.  PROCEDURE IN DETAIL:  The patient was brought to the operating room and general anesthesia was induced.  She was placed in dorsal lithotomy position, and the abdomen and perineum were prepped and draped in sterile fashion.  The bladder was catheterized, and a Hulka tenaculum was placed in the cervix.  Incision was made in the umbilicus and Veress needle was placed to create a pneumoperitoneum.  A 5-mm trocar was placed, and a 5-mm scope was introduced.  Under direct visualization, a 5-mm trocar was placed through a suprapubic stab wound.  However, adhesions in that area precluded clear penetration.  This trocar was removed and a second 5-mm trocar was placed in the right lower quadrant. The pelvis was viewed, and it was noted that dense adhesions were present from the anterior abdominal wall to the lower uterine segment. These adhesions were burned with unipolar current and cut.  The adnexa were inspected and appeared normal.  Both ovaries appeared to have small follicle cysts.  The surgery was then moved to the vaginal area.  The legs were repositioned  for vaginal surgery.  The cervix was grasped with a Christella Hartigan tenaculum, and the paracervical tissues were infiltrated with a dilute Marcaine solution with 1:200,000 epinephrine.  The cervix was circumcised. and the vaginal mucosa was dissected free.  The posterior cul-de-sac was entered sharply and the uterosacral ligaments were clamped, cut, and ligated.  The base of the cardinal ligaments were also clamped, cut, and ligated.  Attention was turned anteriorly, and the bladder was displaced, but the anterior cul-de-sac could not be entered.  The remainder of the cardinal ligaments and the uterine arteries were clamped, cauterized, and cut using the LigaSure clamp, and this dissection was continued until the anterior cul-de-sac could be brought into view and entered by sharp and blunt dissection.  Once the anterior cul-de-sac was entered, the remainder of the base of the broad ligament was cauterized.  The uterus was large and required coring to deliver.  After the uterus was delivered, the utero-ovarian anastomosis was clamped with the LigaSure clamp, cauterized and cut. This was done bilaterally until the specimen was freed.  Hemostasis on the left side was created with a figure-of-eight pop-off suture.  The posterior cuff was then closed with a running interlocking Vicryl 1 suture.  The cul-de-sac was closed with a pop-off suture that incorporated the uterosacral ligaments bilaterally and  the posterior cul- de-sac.  The anterior peritoneum could not be identified.  The anterior vaginal cuff was closed with figure-of-eight sutures.  The bladder was then catheterized with a Foley catheter and clear urine was obtained. The surgeon regloved.  The laparoscope was then used to view the surgical sites, and there was no bleeding seen.  The procedure was then terminated, and the patient left the operating room in good condition.     Ilda Mori, M.D.     RK/MEDQ  D:  05/17/2011  T:   05/18/2011  Job:  [id number]

## 2011-05-18 NOTE — Progress Notes (Signed)
Post Op Day 1 Subjective: up ad lib, voiding, tolerating PO and + flatus  Objective: Blood pressure 108/67, pulse 84, temperature 97.8 F (36.6 C), temperature source Oral, resp. rate 18, height 5\' 2"  (1.575 m), weight 83.915 kg (185 lb), SpO2 100.00%.  Physical Exam:  General: alert Abdomen: soft, non tender, bowel sounds normal Incision: healing well   Basename 05/18/11 0831  HGB 11.3*  HCT 35.1*    Assessment/Plan: Discharge home   LOS: 1 day   Michail Boyte D 05/18/2011, 9:38 AM

## 2011-06-14 ENCOUNTER — Encounter (HOSPITAL_COMMUNITY): Payer: Self-pay | Admitting: Obstetrics & Gynecology

## 2011-09-13 NOTE — Progress Notes (Signed)
Summary: RASH ON INNER THIGH rm 5   Vital Signs:  Patient Profile:   40 Years Old Female CC:      rash on her inner thigh  and top of pelvic area on and off x Height:     62 inches Weight:      184.75 pounds O2 Sat:      100 % O2 treatment:    Room Air Temp:     99.0 degrees F oral Pulse rate:   64 / minute Resp:     18 per minute BP sitting:   146 / 86  (left arm) Cuff size:   regular  Vitals Entered By: Clemens Catholic LPN (March 19, 2951 9:32 AM)                  Updated Prior Medication List: No Medications Current Allergies (reviewed today): No known allergies History of Present Illness Chief Complaint: rash on her inner thigh  and top of pelvic area on and off x History of Present Illness:  Subjective:  Patient complains of pruritic rash in groin area for about two months, now becoming pruritic.  No response to Neosporin  REVIEW OF SYSTEMS Constitutional Symptoms      Denies fever, chills, night sweats, weight loss, weight gain, and fatigue.  Eyes       Denies change in vision, eye pain, eye discharge, glasses, contact lenses, and eye surgery. Ear/Nose/Throat/Mouth       Denies hearing loss/aids, change in hearing, ear pain, ear discharge, dizziness, frequent runny nose, frequent nose bleeds, sinus problems, sore throat, hoarseness, and tooth pain or bleeding.  Respiratory       Denies dry cough, productive cough, wheezing, shortness of breath, asthma, bronchitis, and emphysema/COPD.  Cardiovascular       Denies murmurs, chest pain, and tires easily with exhertion.    Gastrointestinal       Denies stomach pain, nausea/vomiting, diarrhea, constipation, blood in bowel movements, and indigestion. Genitourniary       Denies painful urination, kidney stones, and loss of urinary control. Neurological       Denies paralysis, seizures, and fainting/blackouts. Musculoskeletal       Denies muscle pain, joint pain, joint stiffness, decreased range of  motion, redness, swelling, muscle weakness, and gout.  Skin       Denies bruising, unusual mles/lumps or sores, and hair/skin or nail changes.  Psych       Denies mood changes, temper/anger issues, anxiety/stress, speech problems, depression, and sleep problems. Other Comments: pt c/o rash on and off x on her inner thighs and top of pelvic area. she thinks this is related to running on a treadmill and her thighs rubbing. she states that she saw her GYN last wk but the rash was gone, it came back again yesterday.    Past History:  Past Medical History: Reviewed history from 07/11/2010 and no changes required. Unremarkable  Past Surgical History:  Caesarean section x 2 Tubal ligation  Family History: mother-  Family History Hypertension COPD  Social History: Reviewed history from 07/11/2010 and no changes required. Never Smoked Alcohol use-yes 1 q 6 mths Drug use-no Regular exercise-no   Objective:  No acute distress  Skin:  Inguinal area:  macular erythema with generally defined margins, satellite lesions.  No vesiculation, tenderness or swelling. Assessment New Problems: TINEA CRURIS (ICD-110.3)   Plan New Medications/Changes: TRIAMCINOLONE ACETONIDE 0.1 % CREA (TRIAMCINOLONE ACETONIDE) Apply thin layer to affected area bid  #  30 x 0, 03/19/2011, Donna Christen MD KETOCONAZOLE 2 % CREA (KETOCONAZOLE) Apply thin layer to affected area two times a day  #45gm x 1, 03/19/2011, Donna Christen MD  New Orders: Est. Patient Level III [id number] Planning Comments:   Begin ketoconazole cream.  Begin triamcinolone cream for 2 to 5 days until itching ceases. Follow-up with PCP if not improving.   The patient and/or caregiver has been counseled thoroughly with regard to medications prescribed including dosage, schedule, interactions, rationale for use, and possible side effects and they verbalize understanding.  Diagnoses and expected course of recovery discussed and will  return if not improved as expected or if the condition worsens. Patient and/or caregiver verbalized understanding.  Prescriptions: TRIAMCINOLONE ACETONIDE 0.1 % CREA (TRIAMCINOLONE ACETONIDE) Apply thin layer to affected area bid  #30 x 0   Entered and Authorized by:   Donna Christen MD   Signed by:   Donna Christen MD on 03/19/2011   Method used:   Print then Give to Patient   RxID:   [id number] KETOCONAZOLE 2 % CREA (KETOCONAZOLE) Apply thin layer to affected area two times a day  #45gm x 1   Entered and Authorized by:   Donna Christen MD   Signed by:   Donna Christen MD on 03/19/2011   Method used:   Print then Give to Patient   RxID:   [id number]   Orders Added: 1)  Est. Patient Level III [29528]

## 2012-03-12 ENCOUNTER — Emergency Department (HOSPITAL_BASED_OUTPATIENT_CLINIC_OR_DEPARTMENT_OTHER): Payer: 59

## 2012-03-12 ENCOUNTER — Emergency Department (HOSPITAL_BASED_OUTPATIENT_CLINIC_OR_DEPARTMENT_OTHER)
Admission: EM | Admit: 2012-03-12 | Discharge: 2012-03-12 | Disposition: A | Discharge status: Home / Self Care | Payer: 59 | Attending: Emergency Medicine | Admitting: Emergency Medicine

## 2012-03-12 ENCOUNTER — Encounter (HOSPITAL_BASED_OUTPATIENT_CLINIC_OR_DEPARTMENT_OTHER): Payer: Self-pay | Admitting: Emergency Medicine

## 2012-03-12 DIAGNOSIS — R21 Rash and other nonspecific skin eruption: Secondary | ICD-10-CM

## 2012-03-12 DIAGNOSIS — M25562 Pain in left knee: Secondary | ICD-10-CM

## 2012-03-12 IMAGING — CR DG KNEE 1-2V*L*
2 series · 2 of 2 positions shown · non-contrast
Comparison: None.

CLINICAL DATA: Knee pain.  No known injury.

LEFT KNEE - 1-2 VIEW

[t knee ap left]
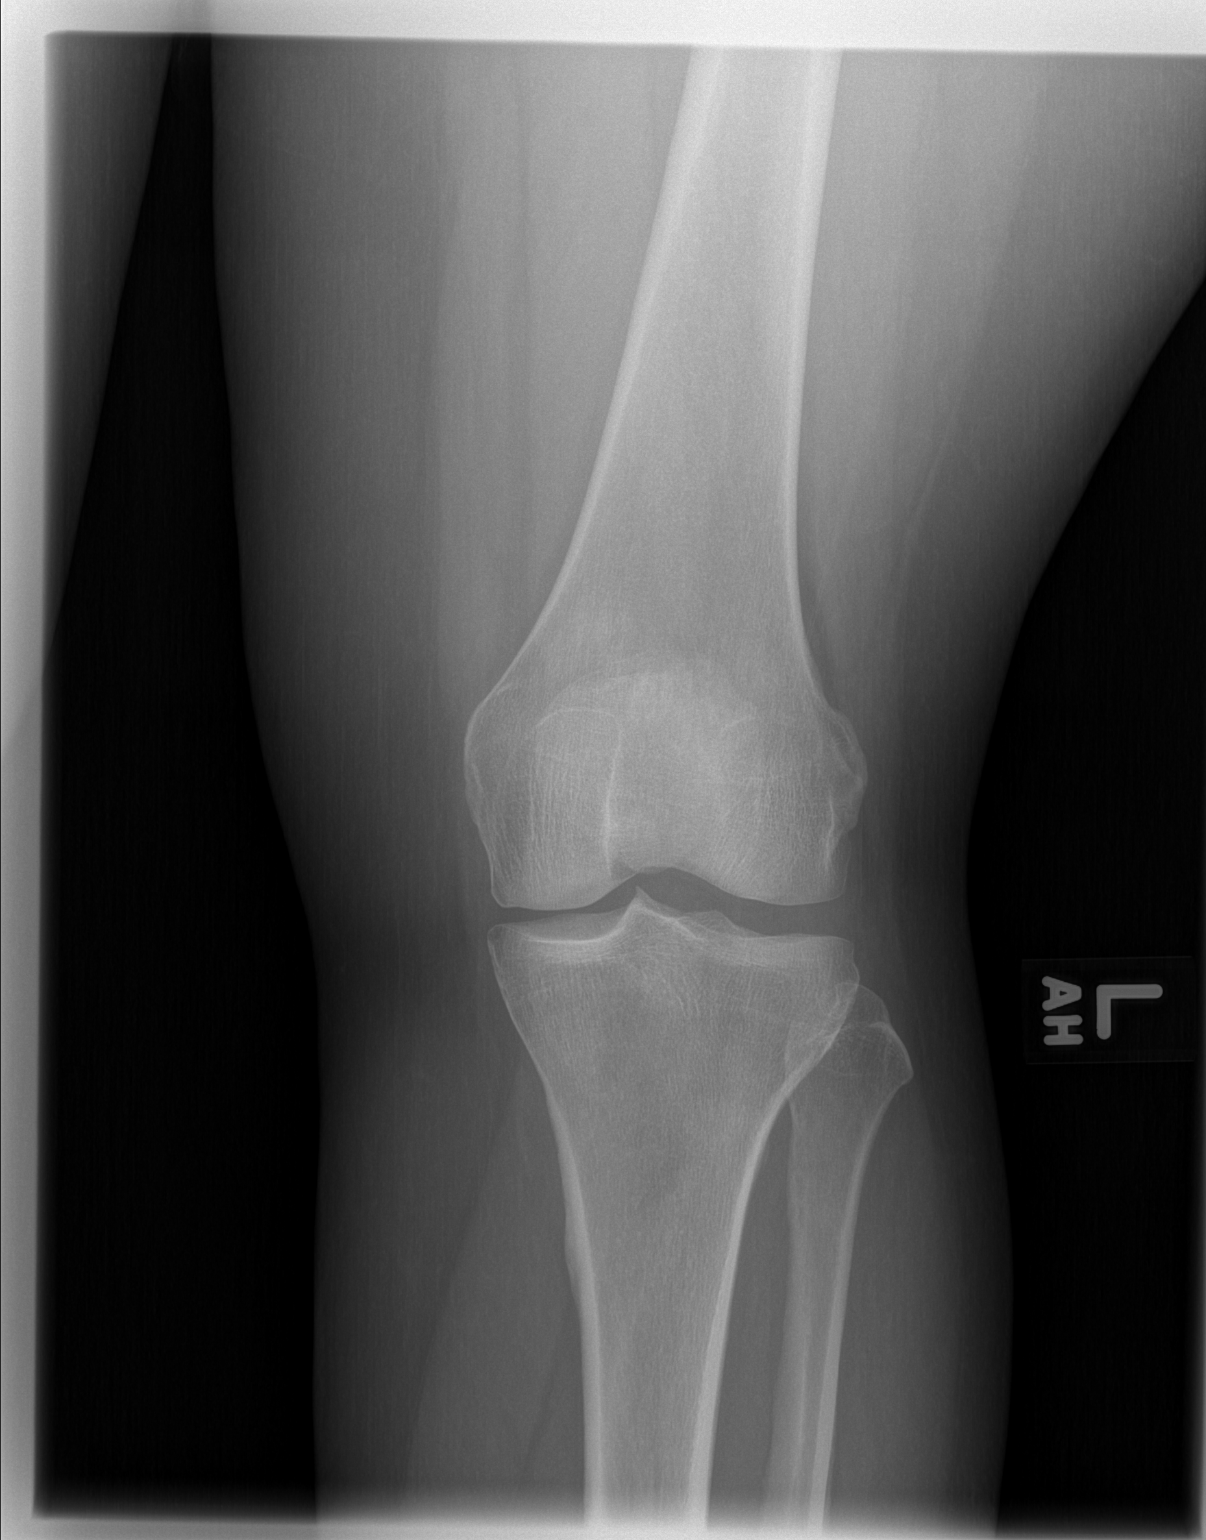

[t knee oblique left]
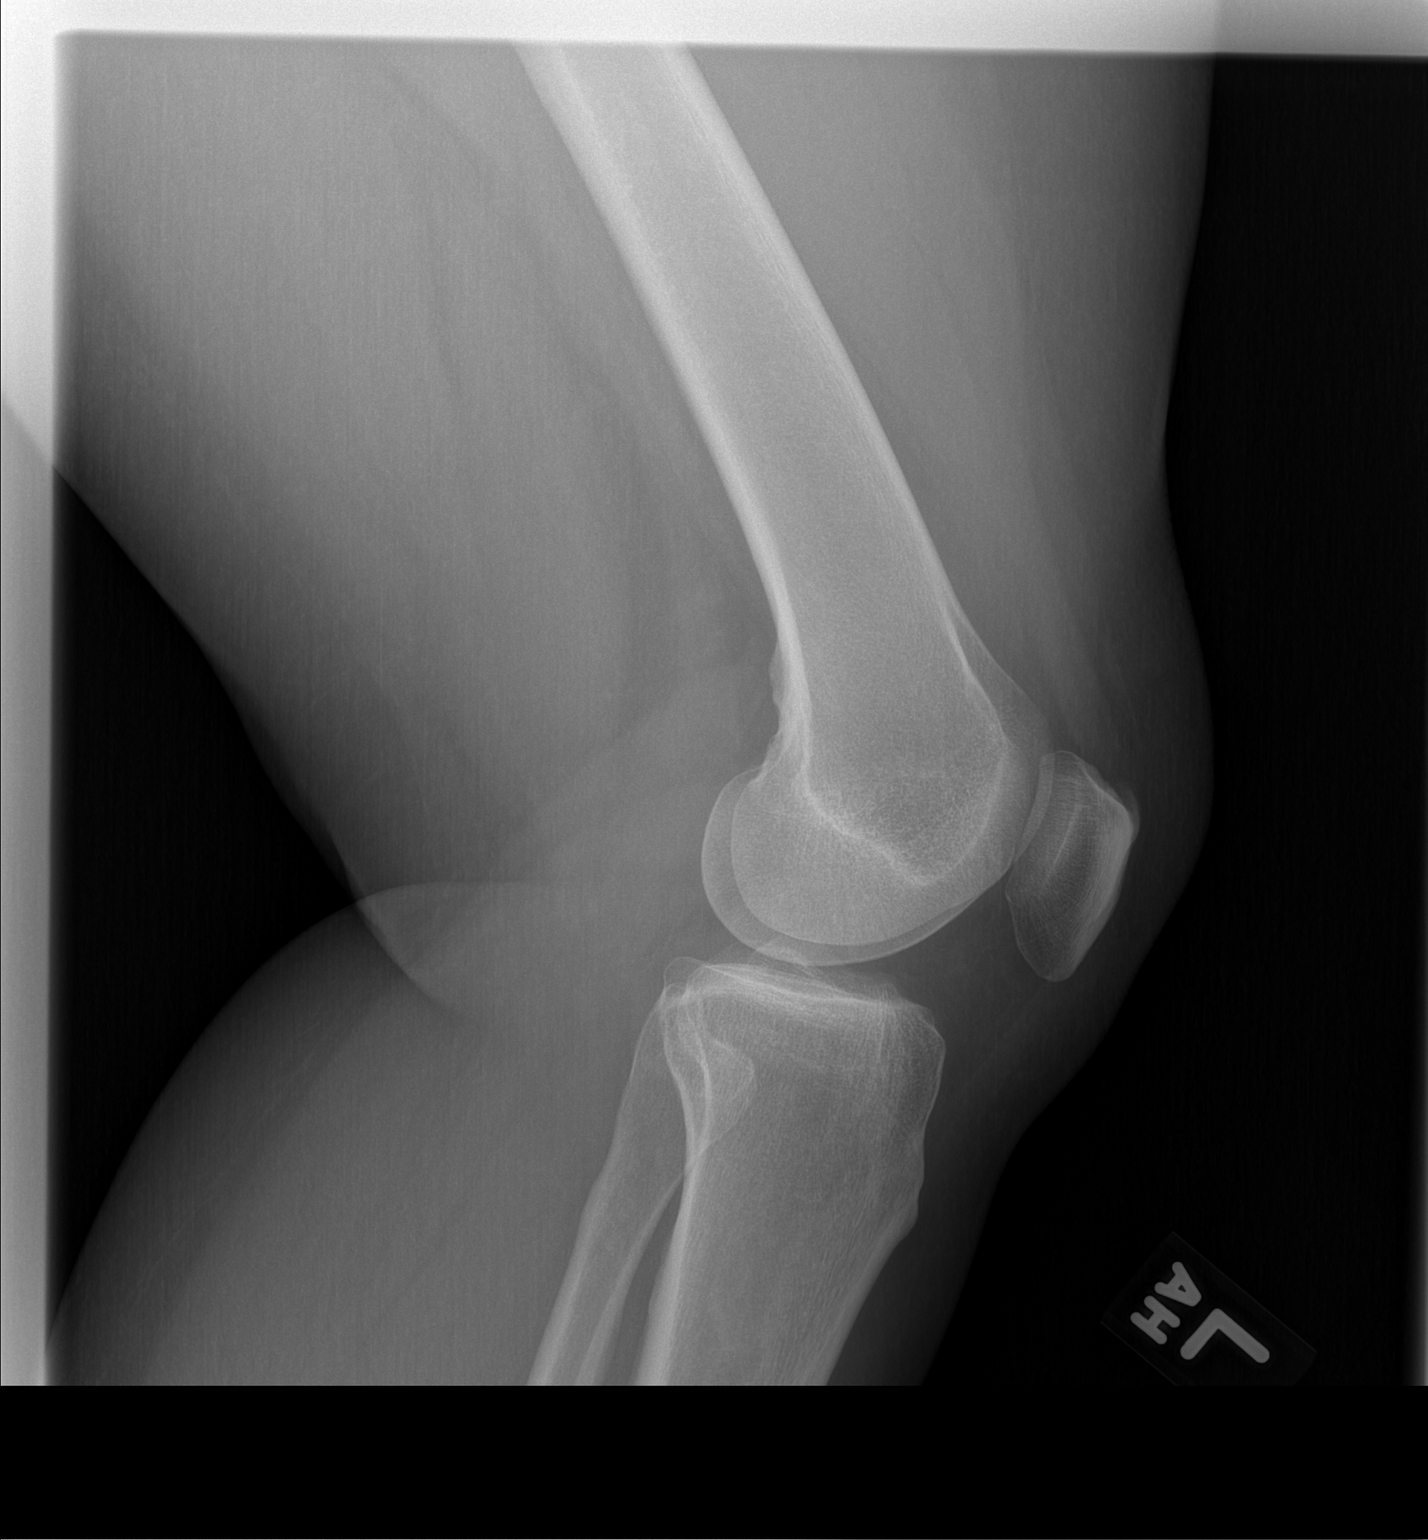

[2 of 2 positions shown; findings below may reference images not displayed]

FINDINGS: Imaged bones, joints and soft tissues appear normal.
IMPRESSION: Negative exam.

## 2012-03-12 NOTE — ED Provider Notes (Signed)
History     CSN: [id number]  Arrival date & time 03/12/12  1505   First MD Initiated Contact with Patient 03/12/12 1757      Chief Complaint  Patient presents with  . Rash  . Knee Pain    (Consider location/radiation/quality/duration/timing/severity/associated sxs/prior treatment) Patient is a 41 y.o. female presenting with rash. The history is provided by the patient. No language interpreter was used.  Rash  Chronicity: 1 week. The problem has not changed since onset.Associated with: pt had a pedicure before rash started. There has been no fever. The rash is present on the right foot, left hand, right hand and left foot.  Pt reports rash is on top of feet and hands.  No feverr, no tick bites.  Pt reports she ran today and has had pain in left knee since.  Pt complains of swelling to her knee.  History reviewed. No pertinent past medical history.  Past Surgical History  Procedure Date  . Cholecystectomy   . Tubal ligation   . C/s x2 '98 '03  . Laparoscopic assisted vaginal hysterectomy 05/17/2011    Procedure: LAPAROSCOPIC ASSISTED VAGINAL HYSTERECTOMY;  Surgeon: Mickel Baas;  Location: [facility];  Service: Gynecology;  Laterality: N/A;  Laparoscopic Assisted Vaginal Hysterectomy With Lysis Of Adhesions  . Cesarean section     No family history on file.  History  Substance Use Topics  . Smoking status: Never Smoker   . Smokeless tobacco: Not on file  . Alcohol Use: Yes     very rarely    OB History    Grav Para Term Preterm Abortions TAB SAB Ect Mult Living                  Review of Systems  Musculoskeletal: Positive for joint swelling.  Skin: Positive for rash.  All other systems reviewed and are negative.    Allergies  Review of patient's allergies indicates no known allergies.  Home Medications   Current Outpatient Rx  Name Route Sig Dispense Refill  . IBUPROFEN 200 MG PO TABS Oral Take 600 mg by mouth every 6 (six) hours as needed. For pain       BP 144/92  Pulse 84  Temp(Src) 98.1 F (36.7 C) (Oral)  Resp 16  SpO2 100%  Physical Exam  Nursing note and vitals reviewed. Constitutional: She is oriented to person, place, and time. She appears well-developed and well-nourished.  HENT:  Head: Normocephalic.  Musculoskeletal: She exhibits edema and tenderness.       Tender left knee, medial aspect,   From nv and ns intact  Neurological: She is alert and oriented to person, place, and time. She has normal reflexes.  Skin: Rash noted.       Red rash dorsal feet and hands. Rash stops at wrist at knees,    Psychiatric: She has a normal mood and affect.    ED Course  Procedures (including critical care time)  Labs Reviewed - No data to display No results found.   No diagnosis found.    MDM  Rash is probably allergic to to something used for pedicure.  Xray no abnormality.   Pt left ams before results.  I asked Charge to notify pt of results of xray.  Pt advised ibuprofen and follow up with her MD        Elson Areas, PA 03/12/12 2051  Lonia Skinner [place], [place] 03/12/12 2051

## 2012-03-12 NOTE — ED Notes (Signed)
Pt c/o rash bil feet x 1 week & LT knee/shin pain since last pm

## 2012-03-12 NOTE — ED Notes (Signed)
Per the request of Verline Lema, [place] called pt with negative xray results.  Pt was very upset and angry that she had not been seen sooner "which is why I left"  Informed pt that xray was negagtive with no obvious fractures  Pt stated "I know its not broken otherwise I couldn't walk on it and I am concerned about the pain and the rash"  Spoke with K. Sofia about pt and she recommended follow up with PCP and Motrin 600-800mg  every 6-8 hours as needed.  Relayed information to pt.

## 2012-03-15 NOTE — ED Provider Notes (Signed)
Medical screening examination/treatment/procedure(s) were performed by non-physician practitioner and as supervising physician I was immediately available for consultation/collaboration.   Gwyneth Sprout, MD 03/15/12 1800

## 2012-03-18 ENCOUNTER — Emergency Department (INDEPENDENT_AMBULATORY_CARE_PROVIDER_SITE_OTHER)
Admission: EM | Admit: 2012-03-18 | Discharge: 2012-03-18 | Disposition: A | Discharge status: Home / Self Care | Payer: 59 | Source: Home / Self Care

## 2012-03-18 ENCOUNTER — Encounter: Payer: Self-pay | Admitting: *Deleted

## 2012-03-18 DIAGNOSIS — M222X2 Patellofemoral disorders, left knee: Secondary | ICD-10-CM

## 2012-03-18 DIAGNOSIS — M25562 Pain in left knee: Secondary | ICD-10-CM

## 2012-03-18 DIAGNOSIS — M25569 Pain in unspecified knee: Secondary | ICD-10-CM

## 2012-03-18 MED ORDER — KETOROLAC TROMETHAMINE 60 MG/2ML IM SOLN
60.0000 mg | Freq: Once | INTRAMUSCULAR | Status: AC
  Administered 2012-03-18: 60 mg via INTRAMUSCULAR

## 2012-03-18 NOTE — Discharge Instructions (Signed)
Patient information: Patellofemoral pain syndrome (The Basics) View in SpanishWritten by the doctors and editors at [employer]   What is patellofemoral pain syndrome? -- Patellofemoral pain syndrome is a condition that causes pain in the front of the knee. It involves the knee cap, which doctors call the "patella" (figure 1).  Many times, patellofemoral pain syndrome happens in runners or other people who put a lot of pressure on their knees. But the condition can also happen when a person's knee cap gets out of line with the knee joint.  What are the symptoms of patellofemoral pain syndrome? -- Patellofemoral pain syndrome causes pain in the front of the knee, or around or behind the knee cap. The pain can come on slowly or quickly. The pain is usually worse when people squat, run, or sit for a long time. Some people might also feel as if their knee is giving out.  Is there a test for patellofemoral pain syndrome? -- No test can tell for sure if you have patellofemoral pain syndrome. But your doctor or nurse should be able to tell if you have the condition by learning about your symptoms and doing an exam.  To make sure your symptoms aren't caused by another condition, your doctor might order an X-ray or imaging test of your knee. Imaging tests create pictures of the inside of the body.  How is patellofemoral pain syndrome treated? -- Treatment usually involves a few parts.  The first part of treatment helps to reduce your pain. It can include:  ?Resting your knee and avoiding activities or movements that make the pain worse  ?Taking nonsteroidal antiinflammatory drugs, also called "NSAIDs" - NSAIDs are a large group of medicines that includes ibuprofen (sample brand names: Advil, Motrin) and naproxen (sample brand names: Aleve, Naprosyn).  ?Putting ice on your knee when it hurts or after activities that cause pain - You can put a cold gel pack, bag of ice, or bag of frozen vegetables on the painful area every 1 to 2  hours, for 15 minutes each time. Put a thin towel between the ice (or other cold object) and your skin.  Another part of treatment involves doing exercises to strengthen the muscles around your knee. Your doctor or nurse will show you which exercises to do, or he or she will have you work with a physical therapist (exercise expert).  Your doctor might also recommend that you:  ?Wear a knee brace to support your knee.  ?Tape up your knee in a certain way to support your knee.  ?Wear special shoe inserts made to fit your foot (to keep your foot from turning in or out too much).  Your symptoms will most likely improve with treatment. But if they don't, your doctor might have you see a knee specialist to discuss treating your condition with surgery.  How can I prevent getting patellofemoral pain syndrome again? -- You can reduce your chances of getting this condition again by doing the exercises and stretches that your doctor or physical therapist shows you.

## 2012-03-18 NOTE — ED Notes (Signed)
Pt has had L knee and calf pain for 6 days.  She was treated for a rash on her leg and hands on Monday but the pain and swelling in her L knee is worsening.

## 2012-03-18 NOTE — ED Provider Notes (Signed)
Pt seen by Dr. Alvester Morin for PFPS  Marlaine Hind, MD 03/18/12 [phone]

## 2012-03-18 NOTE — ED Provider Notes (Signed)
History     CSN: [id number]  Arrival date & time 03/18/12  1140   First MD Initiated Contact with Patient 03/18/12 1212      Chief Complaint  Patient presents with  . Leg Pain    L    (Consider location/radiation/quality/duration/timing/severity/associated sxs/prior treatment) HPI Comments: L knee pain x 1 week.  Started running 3 weeks ago.  Went to ED earlier in the week for knee pain.  Had xrays taken-WNL. Knee pain worse with weight bearing and knee bending.  Knee pain primarily anterior on patellar tendon.  Has had some mild medial knee swelling.  No distal parasthesias.  Has some L ankle swelling and rash that she saw her PCP for.  Had LE U/S negative for DVT per pt.  Currently being treated with doxy for ankle rash.   Patient is a 41 y.o. female presenting with knee pain.  Knee Pain This is a new problem. The current episode started more than 1 week ago. The problem occurs constantly. The problem has not changed since onset.Pertinent negatives include no chest pain, no abdominal pain, no headaches and no shortness of breath. Exacerbated by: knee bending and running.  The symptoms are relieved by rest. She has tried nothing for the symptoms.    History reviewed. No pertinent past medical history.  Past Surgical History  Procedure Date  . Cholecystectomy   . Tubal ligation   . C/s x2 '98 '03  . Laparoscopic assisted vaginal hysterectomy 05/17/2011    Procedure: LAPAROSCOPIC ASSISTED VAGINAL HYSTERECTOMY;  Surgeon: Mickel Baas;  Location: [facility];  Service: Gynecology;  Laterality: N/A;  Laparoscopic Assisted Vaginal Hysterectomy With Lysis Of Adhesions  . Cesarean section     History reviewed. No pertinent family history.  History  Substance Use Topics  . Smoking status: Never Smoker   . Smokeless tobacco: Not on file  . Alcohol Use: Yes     very rarely    OB History    Grav Para Term Preterm Abortions TAB SAB Ect Mult Living                  Review  of Systems  Respiratory: Negative for shortness of breath.   Cardiovascular: Negative for chest pain.  Gastrointestinal: Negative for abdominal pain.  Neurological: Negative for headaches.  All other systems reviewed and are negative.    Allergies  Review of patient's allergies indicates no known allergies.  Home Medications   Current Outpatient Rx  Name Route Sig Dispense Refill  . DOXYCYCLINE HYCLATE 100 MG PO CPEP Oral Take 100 mg by mouth 2 (two) times daily.    . IBUPROFEN 200 MG PO TABS Oral Take 600 mg by mouth every 6 (six) hours as needed. For pain      BP 157/87  Pulse 61  Temp(Src) 97.8 F (36.6 C) (Oral)  Resp 18  Ht 5\' 1"  (1.549 m)  Wt 188 lb (85.276 kg)  BMI 35.52 kg/m2  SpO2 100%  Physical Exam  Constitutional: She appears well-developed and well-nourished.       Mildly obese.  HENT:  Head: Normocephalic and atraumatic.  Eyes: Conjunctivae are normal. Pupils are equal, round, and reactive to light.  Neck: Normal range of motion. Neck supple.  Cardiovascular: Normal rate and regular rhythm.   Pulmonary/Chest: Effort normal and breath sounds normal.  Musculoskeletal:       Legs:   ED Course  Procedures (including critical care time)  Labs Reviewed - No data to display  No results found.   1. Patellofemoral syndrome, left       MDM  Likely patellofemoral syndrome in a new runner.  Knee sleeve.  RICE.  Follow up with sports medicine in 2-3 weeks.  Handout given.  Follow up as needed.     The patient and/or caregiver has been counseled thoroughly with regard to treatment plan and/or medications prescribed including dosage, schedule, interactions, rationale for use, and possible side effects and they verbalize understanding. Diagnoses and expected course of recovery discussed and will return if not improved as expected or if the condition worsens. Patient and/or caregiver verbalized understanding.             Floydene Flock,  MD 03/18/12 1239

## 2016-04-06 ENCOUNTER — Encounter: Payer: Self-pay | Admitting: Pediatrics

## 2018-10-11 DIAGNOSIS — C801 Malignant (primary) neoplasm, unspecified: Secondary | ICD-10-CM

## 2018-10-11 HISTORY — DX: Malignant (primary) neoplasm, unspecified: C80.1

## 2019-04-27 ENCOUNTER — Other Ambulatory Visit: Payer: Self-pay | Admitting: Obstetrics & Gynecology

## 2019-04-27 DIAGNOSIS — R928 Other abnormal and inconclusive findings on diagnostic imaging of breast: Secondary | ICD-10-CM

## 2019-05-01 ENCOUNTER — Other Ambulatory Visit: Payer: Self-pay

## 2019-05-01 ENCOUNTER — Other Ambulatory Visit: Payer: Self-pay | Admitting: Obstetrics & Gynecology

## 2019-05-01 ENCOUNTER — Ambulatory Visit
Admission: RE | Admit: 2019-05-01 | Discharge: 2019-05-01 | Disposition: A | Discharge status: Home / Self Care | Payer: 59 | Source: Ambulatory Visit | Attending: Obstetrics & Gynecology | Admitting: Obstetrics & Gynecology

## 2019-05-01 DIAGNOSIS — N6310 Unspecified lump in the right breast, unspecified quadrant: Secondary | ICD-10-CM

## 2019-05-01 DIAGNOSIS — R928 Other abnormal and inconclusive findings on diagnostic imaging of breast: Secondary | ICD-10-CM

## 2019-05-01 IMAGING — US ULTRASOUND RIGHT BREAST LIMITED
1 series · 13 of 14 positions shown · non-contrast
Comparison: Previous exam(s).

CLINICAL DATA: The patient was called back for a new spiculated
mass on the right.

EXAM:
DIGITAL DIAGNOSTIC RIGHT MAMMOGRAM WITH TOMO
ULTRASOUND RIGHT BREAST

[Series 1: ultrasound right breast limited · 0.07mm/px · 13 of 14 slices shown]
[im 1/14]
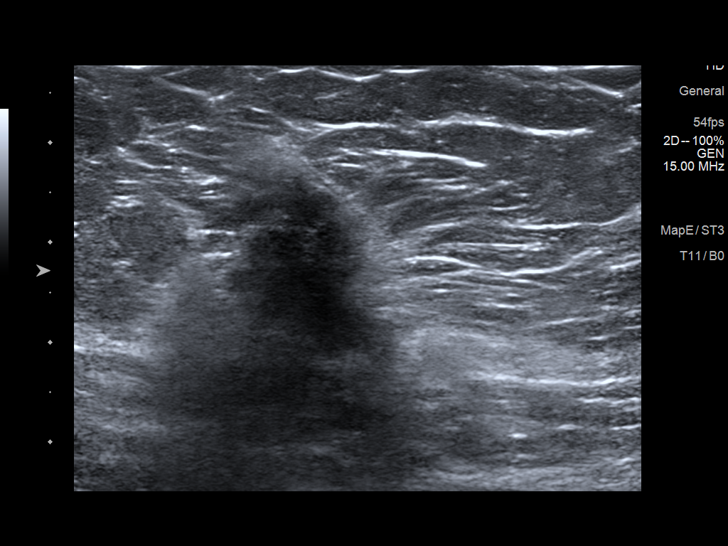
[im 2/14]
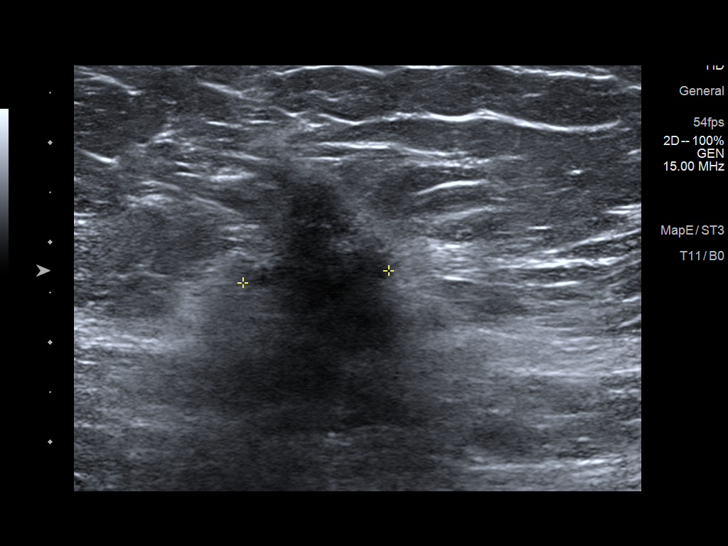
[im 3/14]
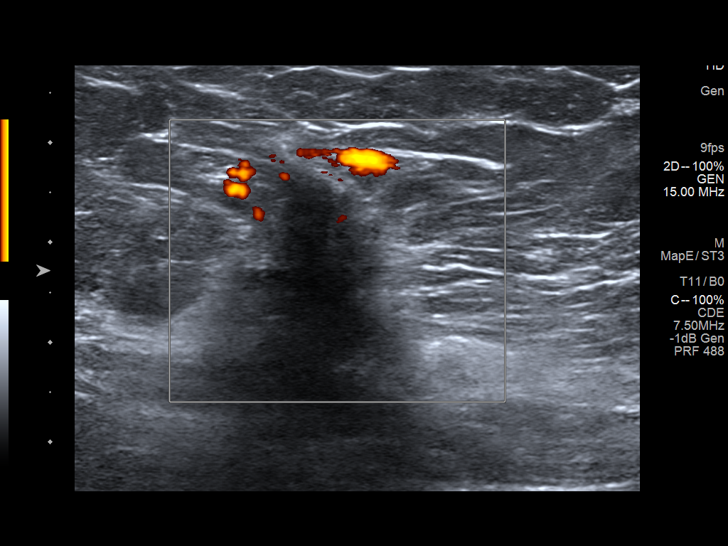
[im 4/14]
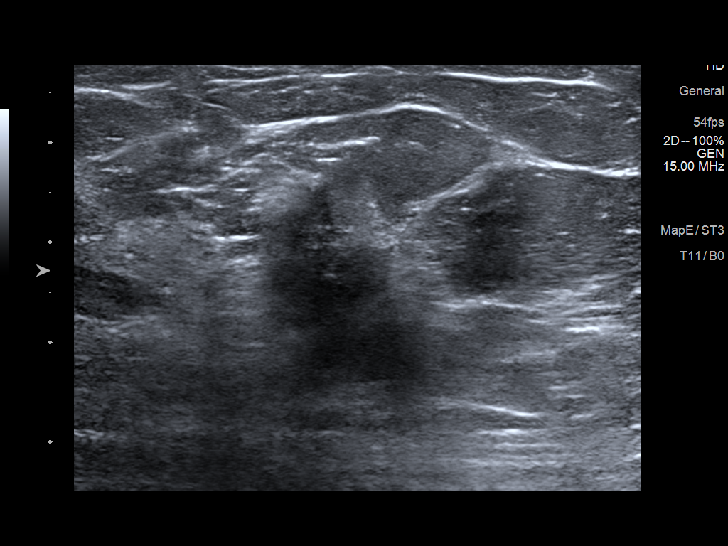
[im 5/14]
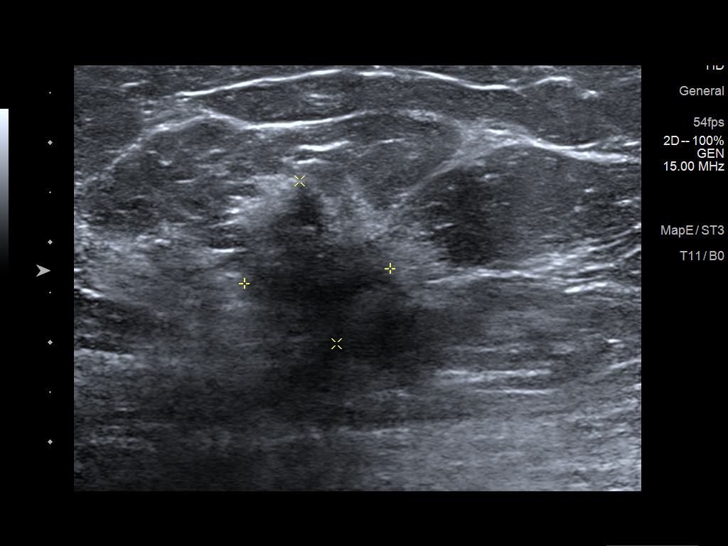
[im 6/14]
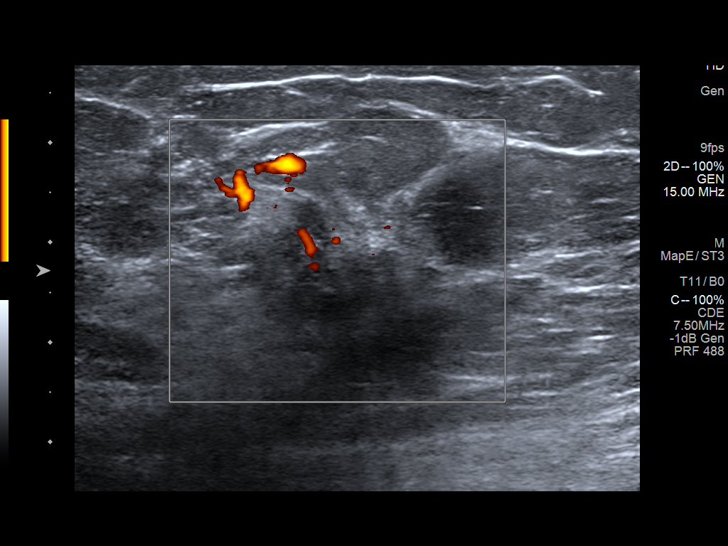
[im 8/14]
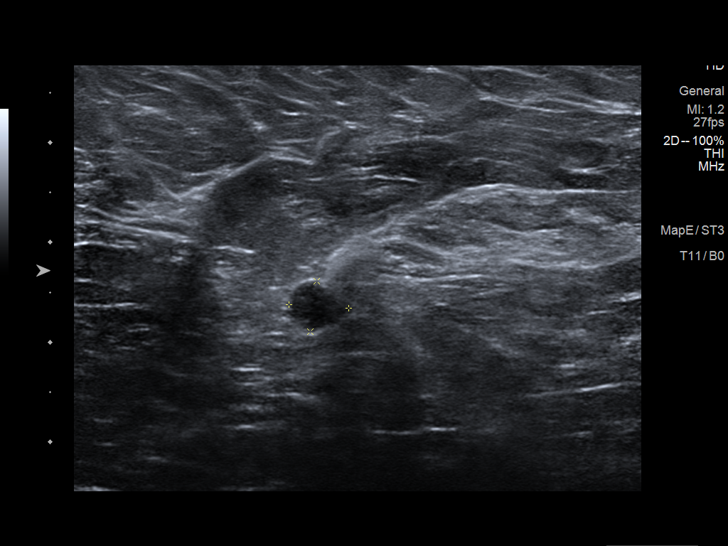
[im 9/14]
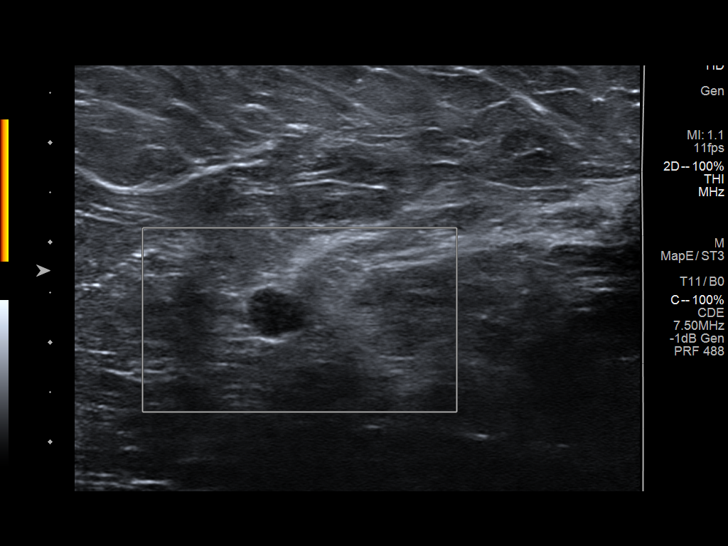
[im 10/14]
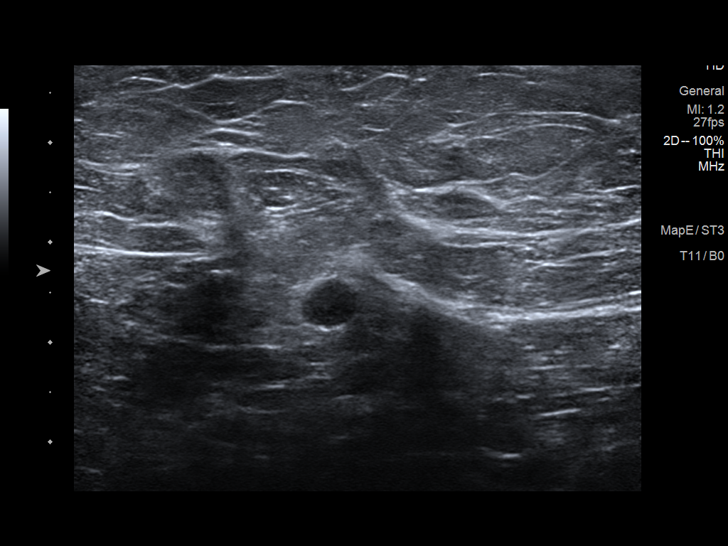
[im 11/14]
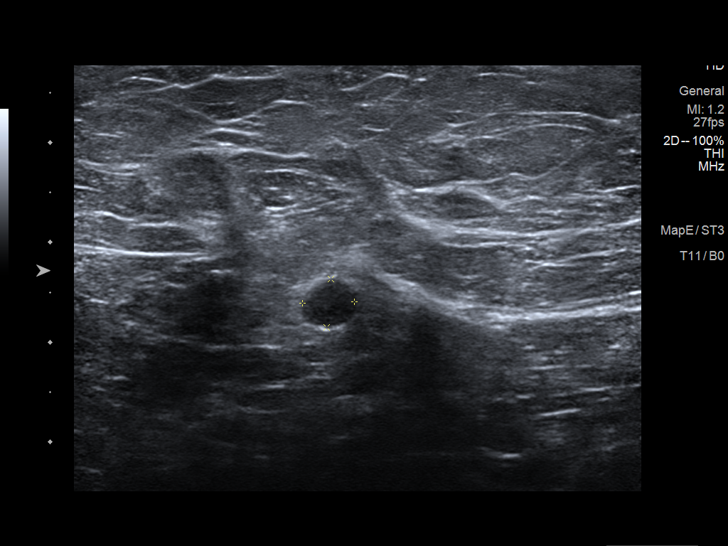
[im 12/14]
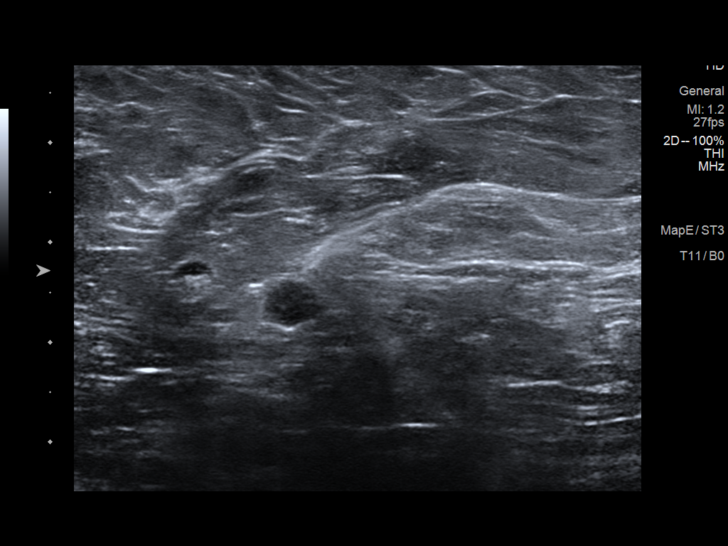
[im 13/14]
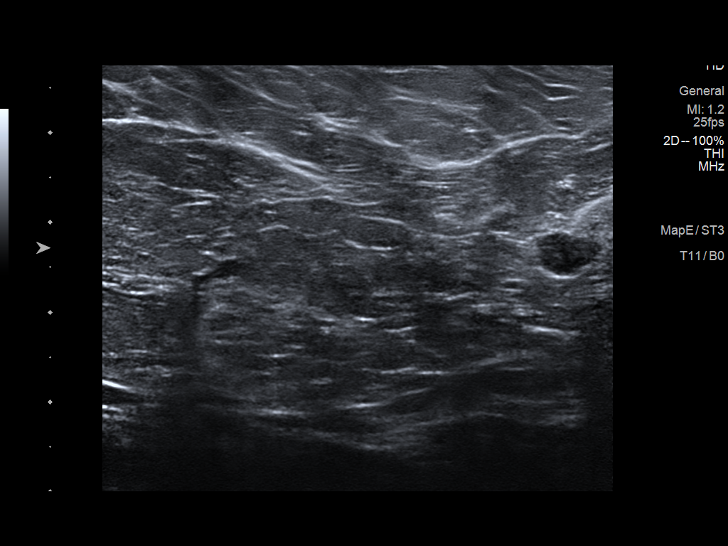
[im 14/14]
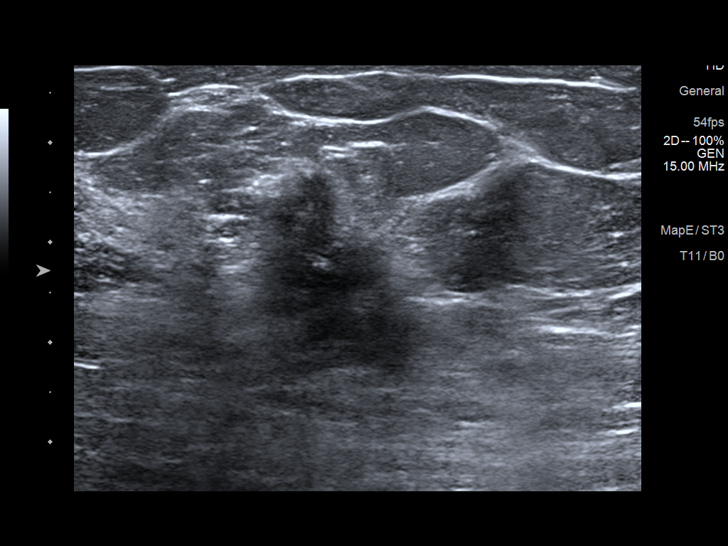

[13 of 14 positions shown; findings below may reference images not displayed]

ACR Breast Density Category c: The breast tissue is heterogeneously
dense, which may obscure small masses.
FINDINGS: The spiculated mass in the upper outer right breast persists on
additional imaging.

On physical exam, no suspicious lumps are identified.

Targeted ultrasound is performed, showing an irregular mass in the
right breast at 10 o'clock, 8 cm from the nipple correlating with
the mammographic finding measuring 1.5 by 1.5 x 1.7 cm. There is a
hypoechoic mass in the right axillary tail/low axilla at 10 o'clock,
12 cm from the nipple measuring 6 x 5 by 5 mm, possibly a mildly
abnormal lymph node. No other adenopathy seen in the right axilla.
IMPRESSION: Highly suspicious irregular mass in the right breast at 10 o'clock,
8 cm from the nipple. Indeterminate hypoechoic round mass in the
right axillary tail/low axilla.

RECOMMENDATION:
Recommend ultrasound-guided biopsy of the suspicious mass in the
right breast at 10 o'clock, 8 cm from the nipple and the round
hypoechoic mass in the right axillary tail/low axilla at 10 o'clock,
12 cm from the nipple.

I have discussed the findings and recommendations with the patient.
Results were also provided in writing at the conclusion of the
visit. If applicable, a reminder letter will be sent to the patient
regarding the next appointment.

BI-RADS CATEGORY  5: Highly suggestive of malignancy.

## 2019-05-01 IMAGING — MG DIGITAL DIAGNOSTIC UNILATERAL RIGHT MAMMOGRAM WITH TOMO AND CAD
4 series · 4 of 12 positions shown · non-contrast
Comparison: Previous exam(s).

CLINICAL DATA: The patient was called back for a new spiculated
mass on the right.

EXAM:
DIGITAL DIAGNOSTIC RIGHT MAMMOGRAM WITH TOMO
ULTRASOUND RIGHT BREAST

[R MLO synth-2D]
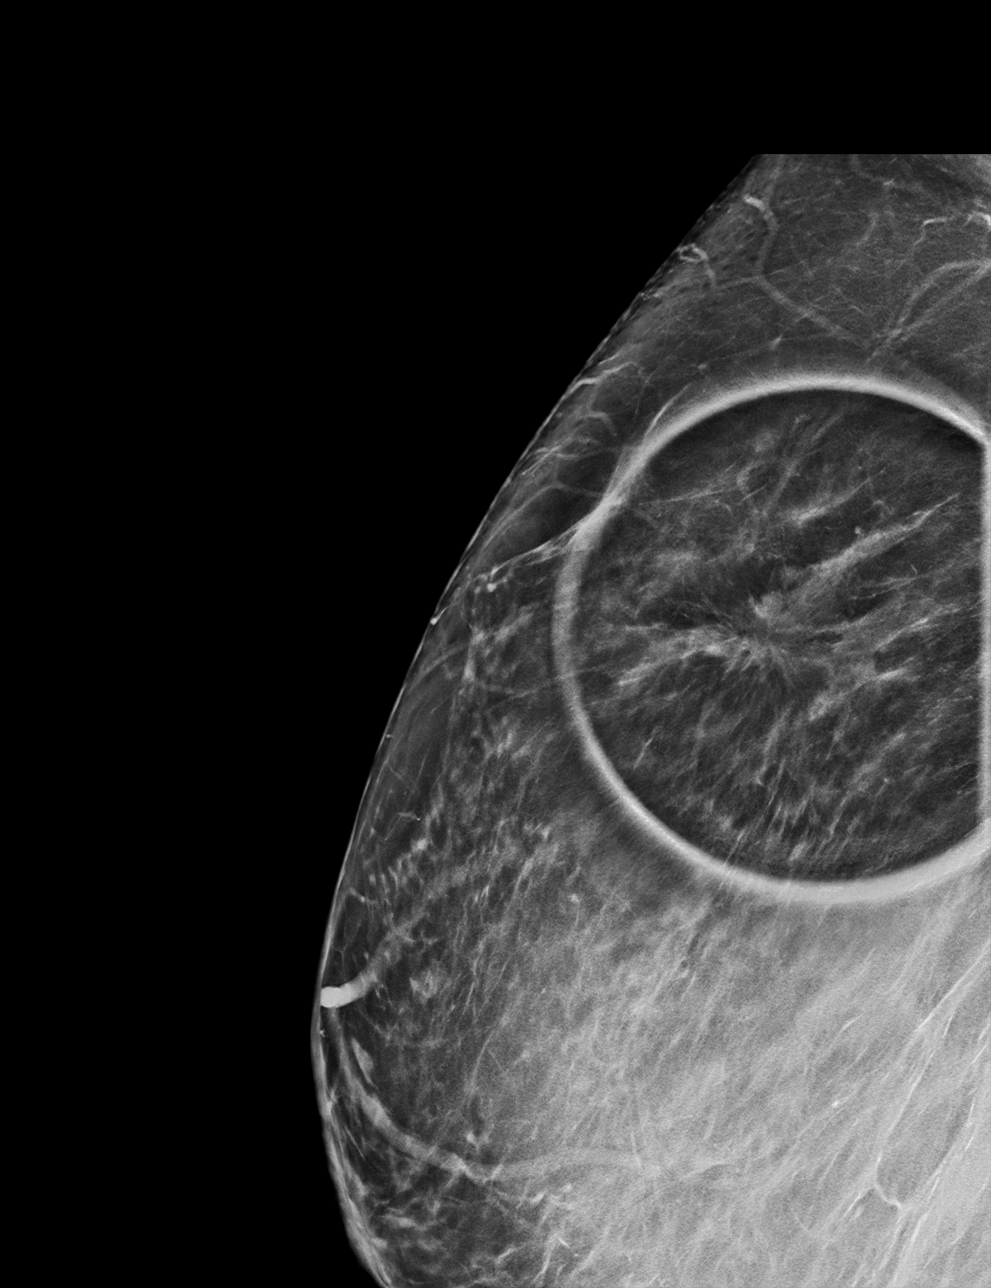

[R CC synth-2D]
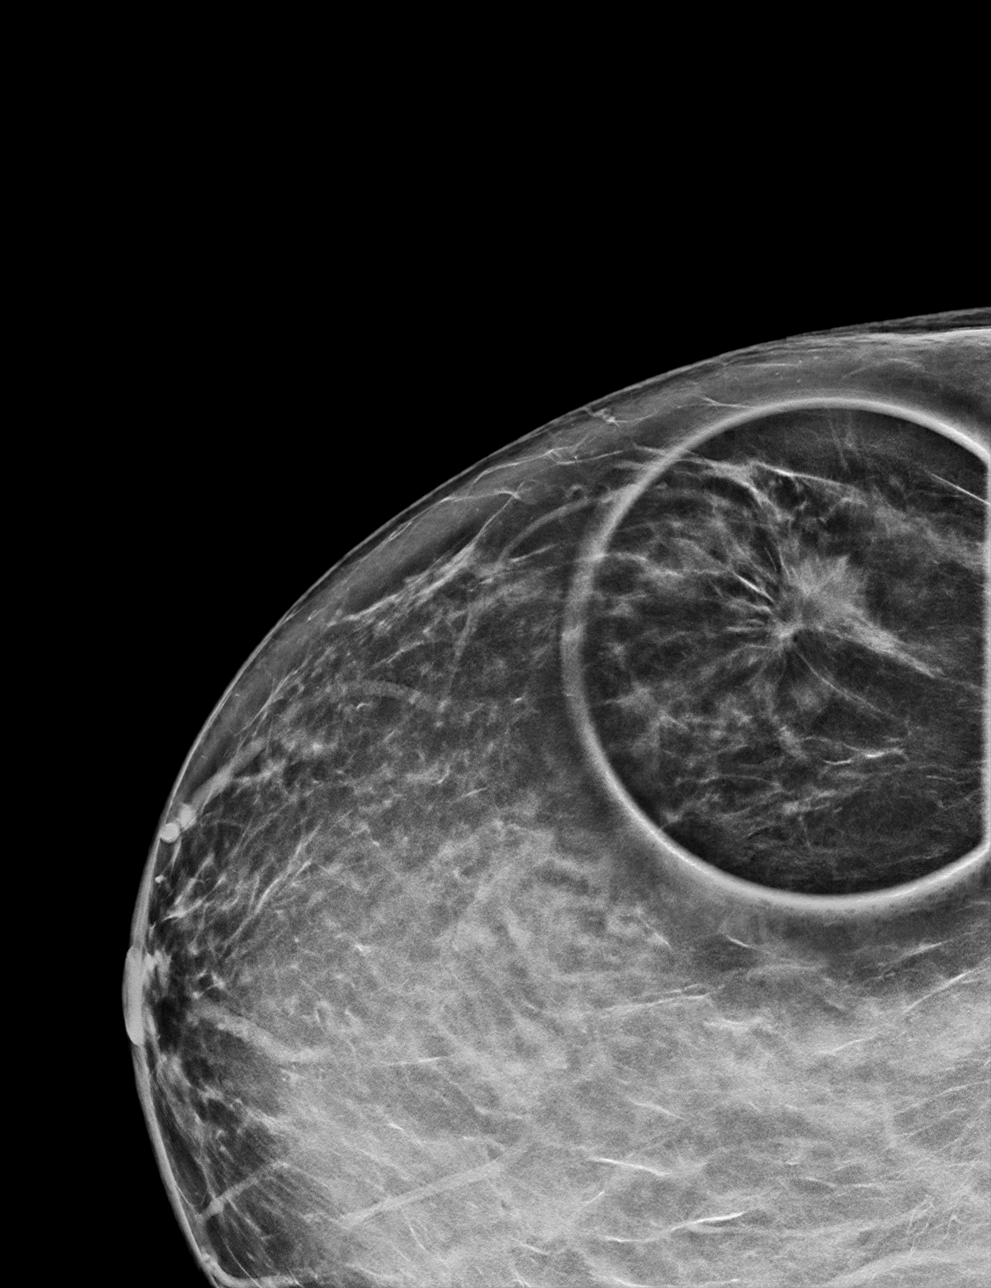

[R MLO tomo · tomo slice 43/84.0]
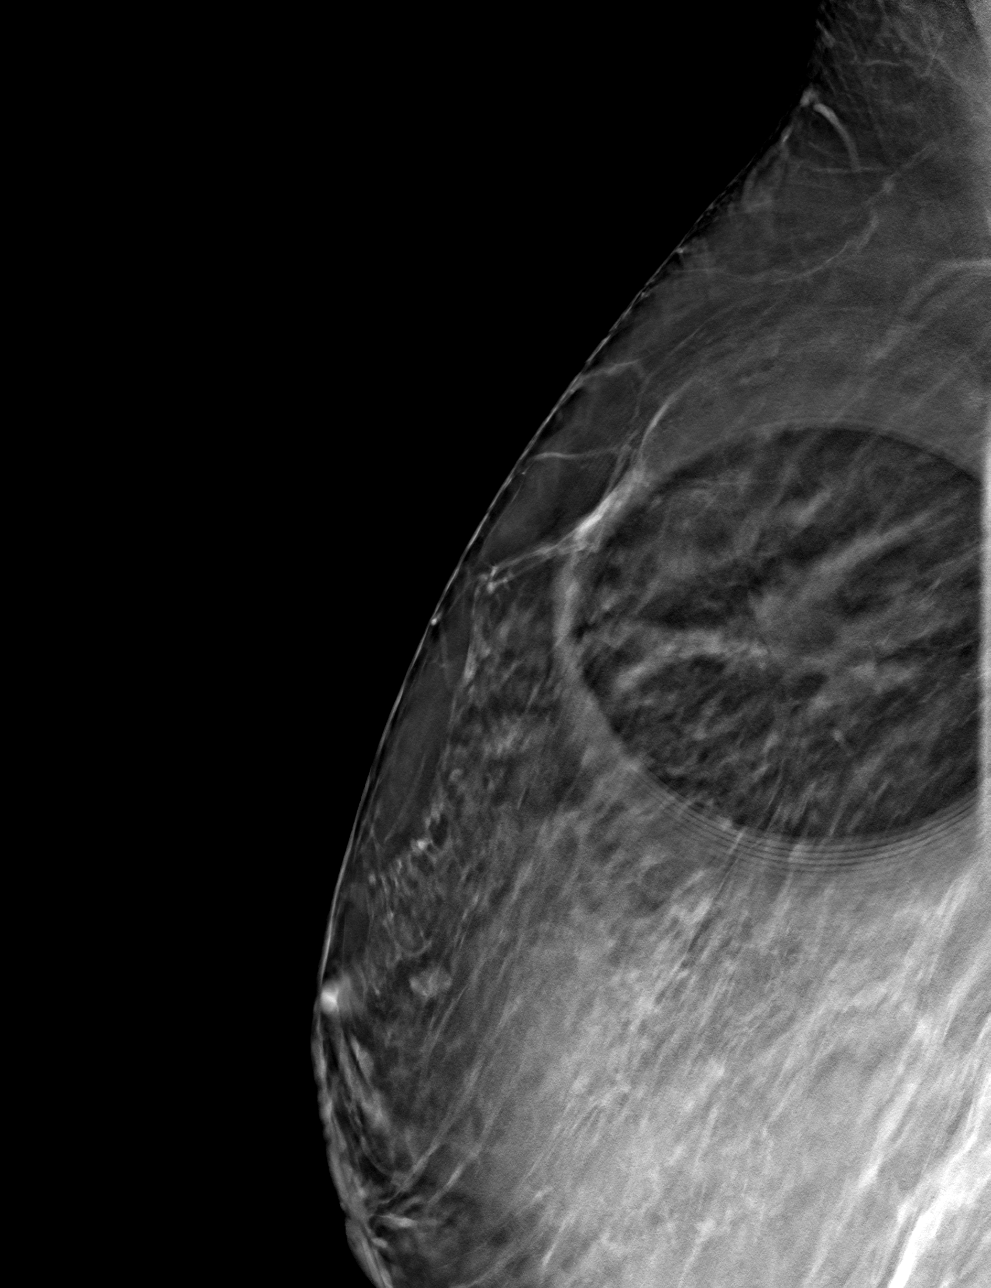

[R CC tomo · tomo slice 39/77.0]
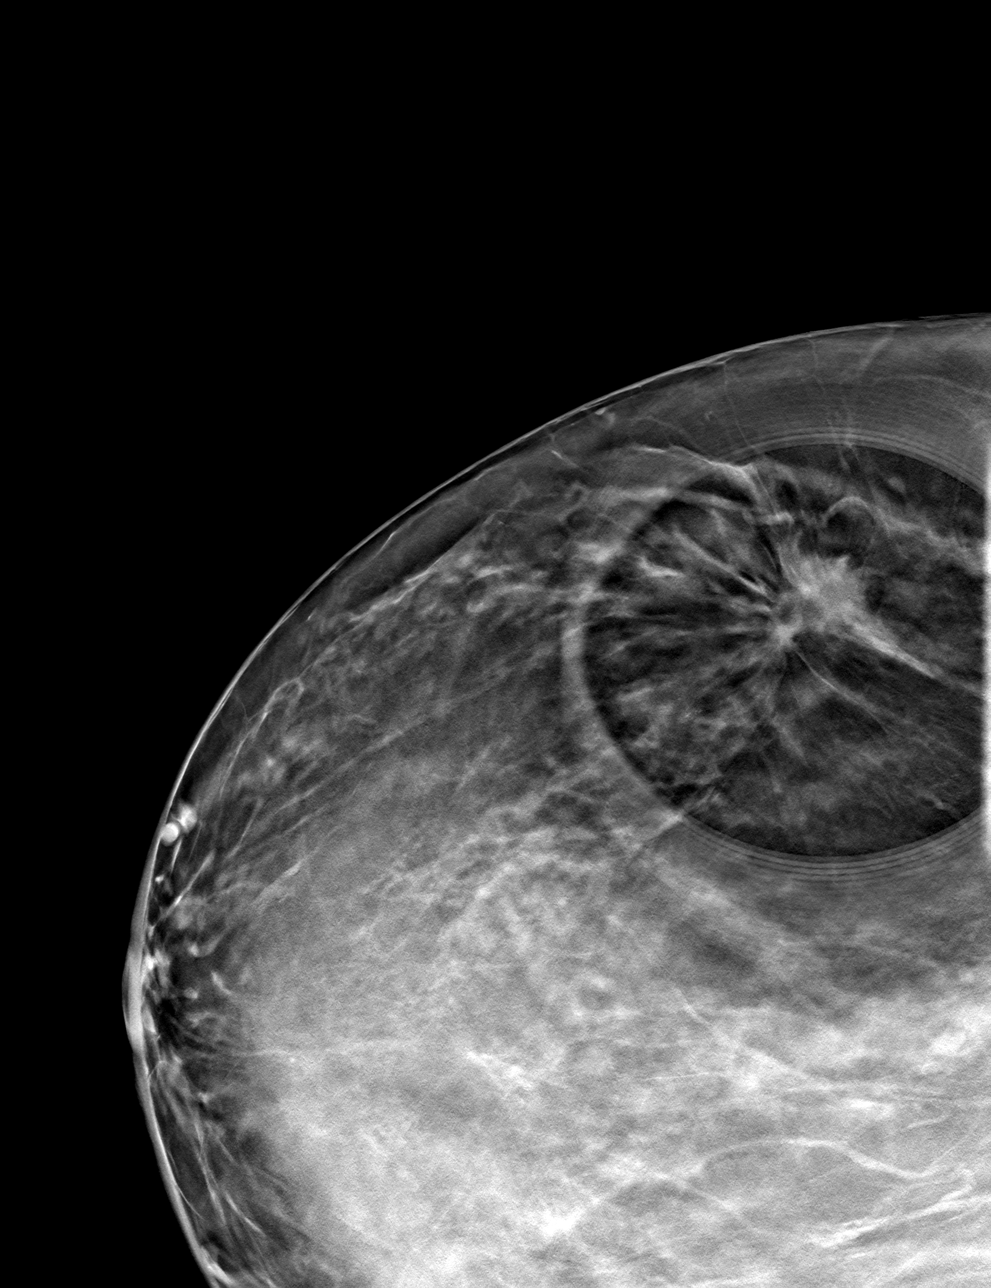

[4 of 12 positions shown; findings below may reference images not displayed]

ACR Breast Density Category c: The breast tissue is heterogeneously
dense, which may obscure small masses.
FINDINGS: The spiculated mass in the upper outer right breast persists on
additional imaging.

On physical exam, no suspicious lumps are identified.

Targeted ultrasound is performed, showing an irregular mass in the
right breast at 10 o'clock, 8 cm from the nipple correlating with
the mammographic finding measuring 1.5 by 1.5 x 1.7 cm. There is a
hypoechoic mass in the right axillary tail/low axilla at 10 o'clock,
12 cm from the nipple measuring 6 x 5 by 5 mm, possibly a mildly
abnormal lymph node. No other adenopathy seen in the right axilla.
IMPRESSION: Highly suspicious irregular mass in the right breast at 10 o'clock,
8 cm from the nipple. Indeterminate hypoechoic round mass in the
right axillary tail/low axilla.

RECOMMENDATION:
Recommend ultrasound-guided biopsy of the suspicious mass in the
right breast at 10 o'clock, 8 cm from the nipple and the round
hypoechoic mass in the right axillary tail/low axilla at 10 o'clock,
12 cm from the nipple.

I have discussed the findings and recommendations with the patient.
Results were also provided in writing at the conclusion of the
visit. If applicable, a reminder letter will be sent to the patient
regarding the next appointment.

BI-RADS CATEGORY  5: Highly suggestive of malignancy.

## 2019-05-02 ENCOUNTER — Other Ambulatory Visit (HOSPITAL_COMMUNITY): Payer: Self-pay | Admitting: Diagnostic Radiology

## 2019-05-02 ENCOUNTER — Ambulatory Visit
Admission: RE | Admit: 2019-05-02 | Discharge: 2019-05-02 | Disposition: A | Discharge status: Home / Self Care | Payer: 59 | Source: Ambulatory Visit | Attending: Obstetrics & Gynecology | Admitting: Obstetrics & Gynecology

## 2019-05-02 ENCOUNTER — Other Ambulatory Visit: Payer: Self-pay | Admitting: Obstetrics & Gynecology

## 2019-05-02 DIAGNOSIS — N6310 Unspecified lump in the right breast, unspecified quadrant: Secondary | ICD-10-CM

## 2019-05-02 HISTORY — PX: BREAST BIOPSY: SHX20

## 2019-05-02 IMAGING — MG MM CLIP PLACEMENT
6 series · 6 of 18 positions shown · non-contrast
Comparison: Previous exam(s).

CLINICAL DATA: Status post ultrasound-guided core biopsy of 2
masses in the RIGHT breast.

EXAM:
DIAGNOSTIC LEFT MAMMOGRAM POST ULTRASOUND BIOPSY x2

[R XCCL synth-2D]
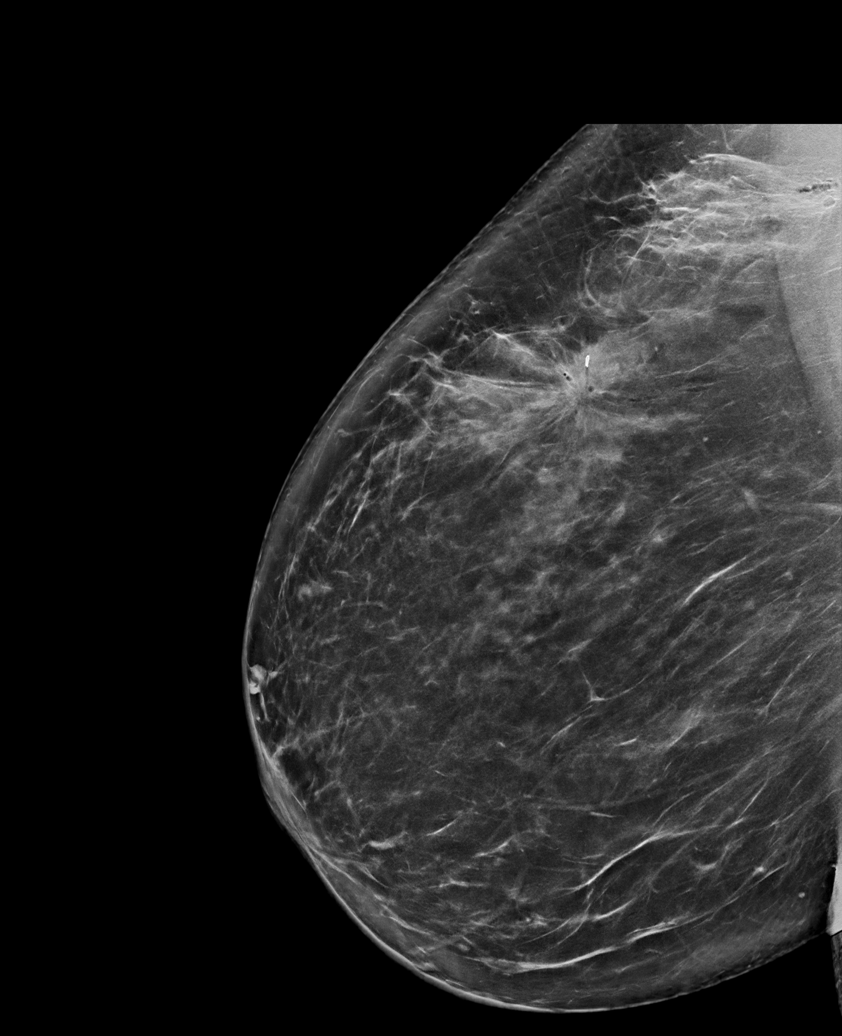

[R ML synth-2D]
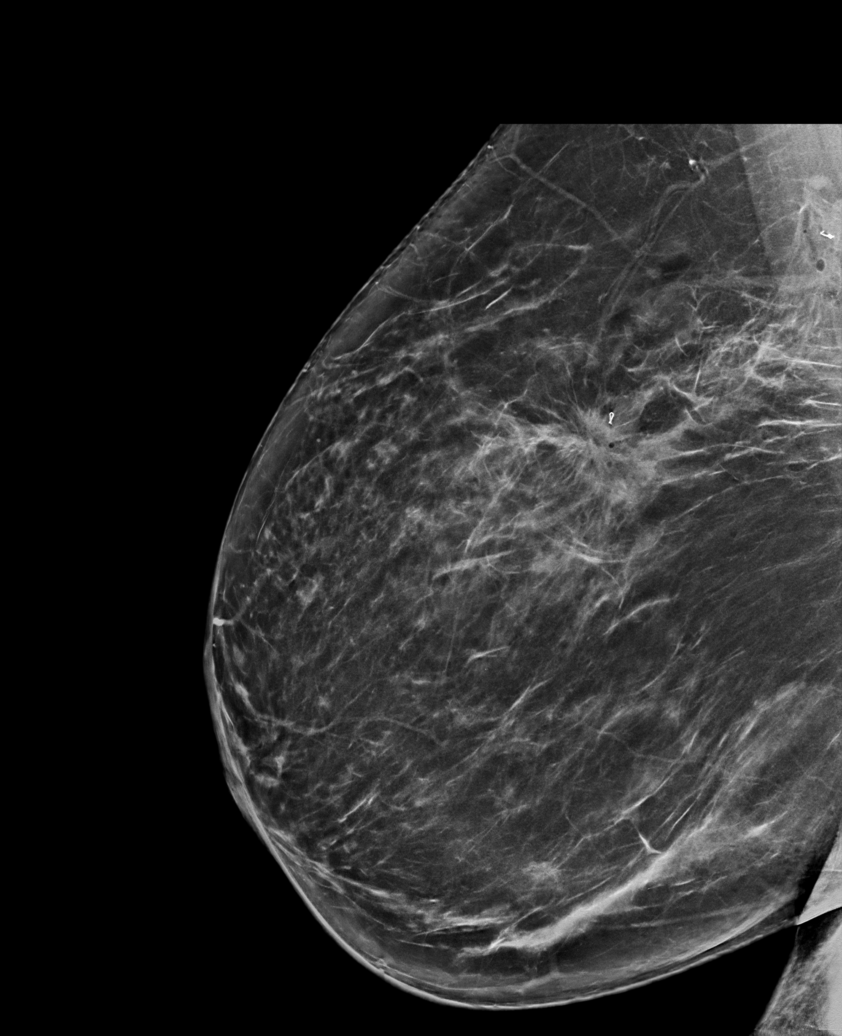

[R CC synth-2D]
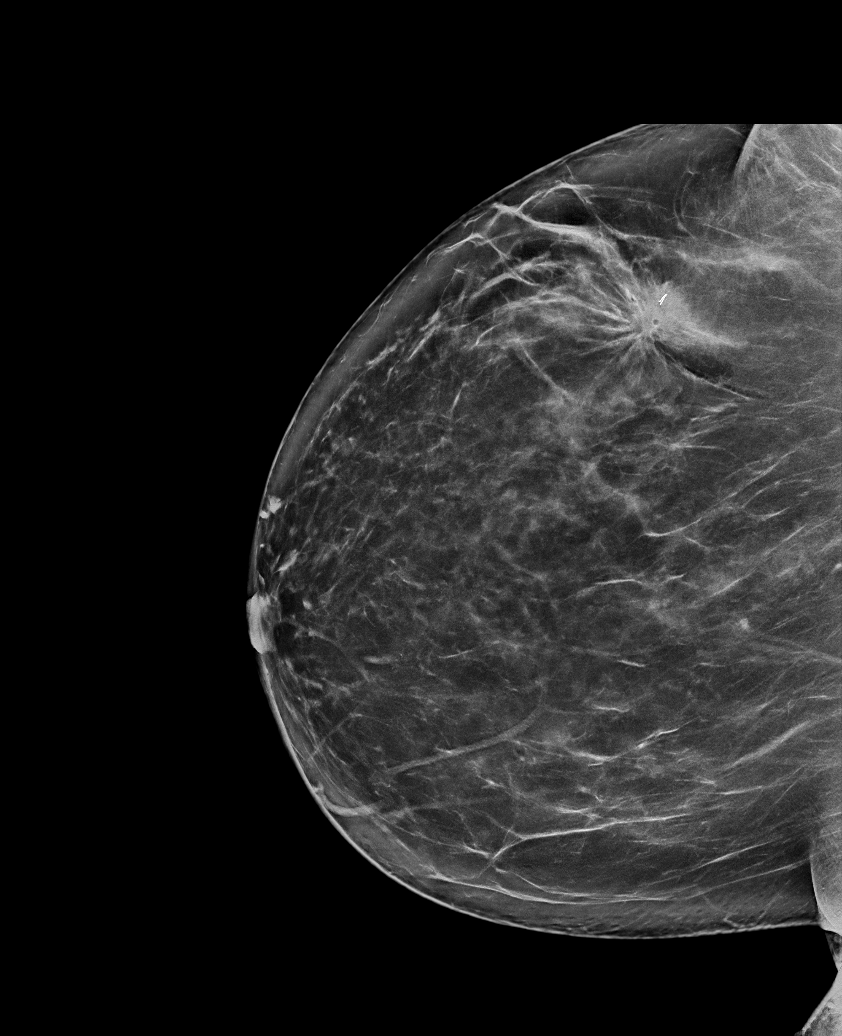

[R XCCL tomo · tomo slice 50/99.0]
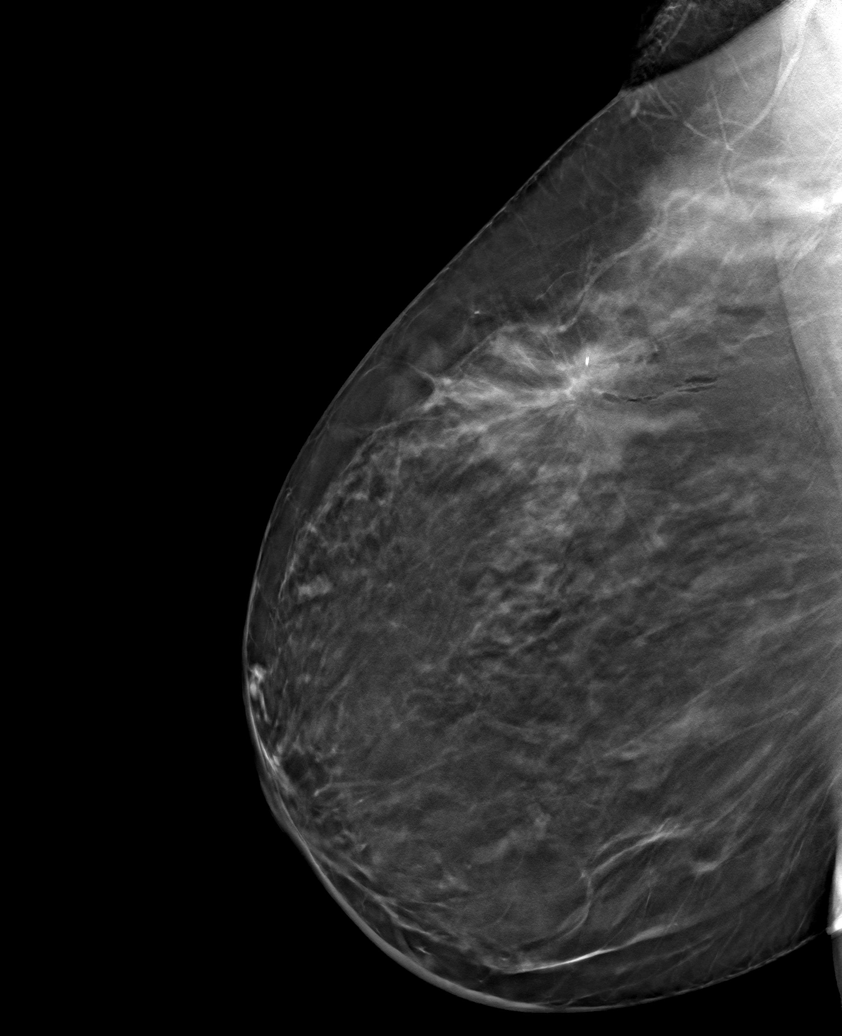

[R CC tomo · tomo slice 49/98.0]
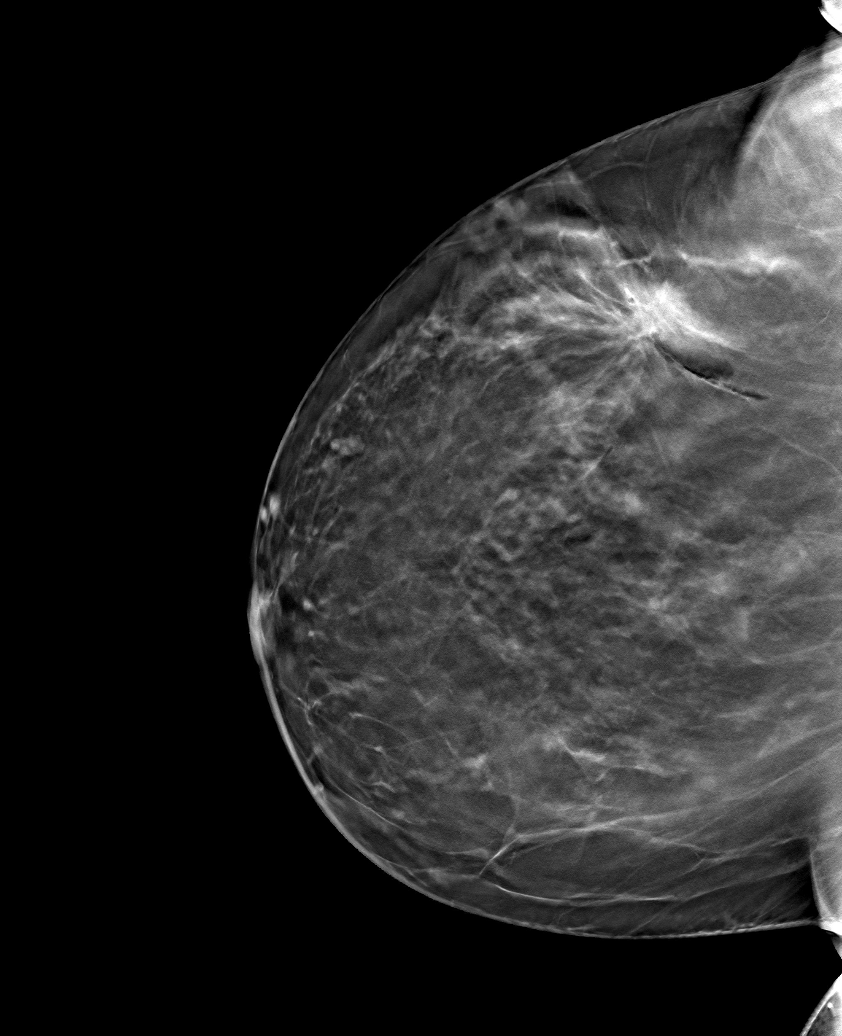

[R ML tomo · tomo slice 44/87.0]
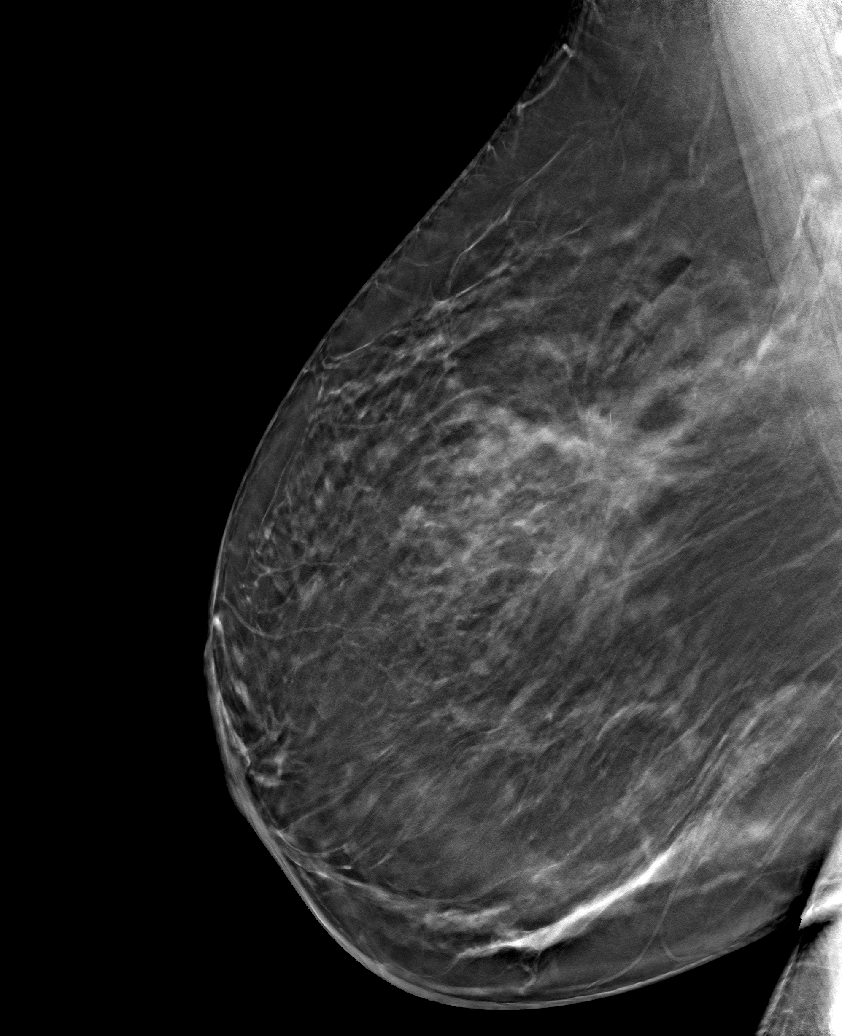

[6 of 18 positions shown; findings below may reference images not displayed]

FINDINGS: Mammographic images were obtained following ultrasound guided biopsy
of mass in the 10 o'clock location of the RIGHT breast and placement
of a ribbon shaped clip. The ribbon shaped clip is identified in the
UPPER-OUTER QUADRANT of the RIGHT breast as expected. Following
biopsy of mass in the 10 o'clock location 12 centimeters from the
nipple and placement of a coil shaped clip, the clip is in expected
location in the UPPER-OUTER QUADRANT RIGHT breast/LOWER RIGHT
axilla.
IMPRESSION: Tissue marker clips are in the expected locations after biopsy.

Final Assessment: Post Procedure Mammograms for Marker Placement

## 2019-05-02 IMAGING — US US BREAST BX W LOC DEV 1ST LESION IMG BX SPEC US GUIDE*R*
1 series · 12 of 22 positions shown · non-contrast
Comparison: Previous exam(s).
COMPARISON: Previous exam(s).

Addendum:
CLINICAL DATA: The patient presents for ultrasound-guided core
biopsy of 2 masses in the RIGHT breast.

EXAM:
ULTRASOUND GUIDED RIGHT BREAST CORE NEEDLE BIOPSY x2

[Series 1: us breast bx w loc dev 1st lesion img bx spec us g · 0.08mm/px · 12 of 22 slices shown]
[im 1/22]
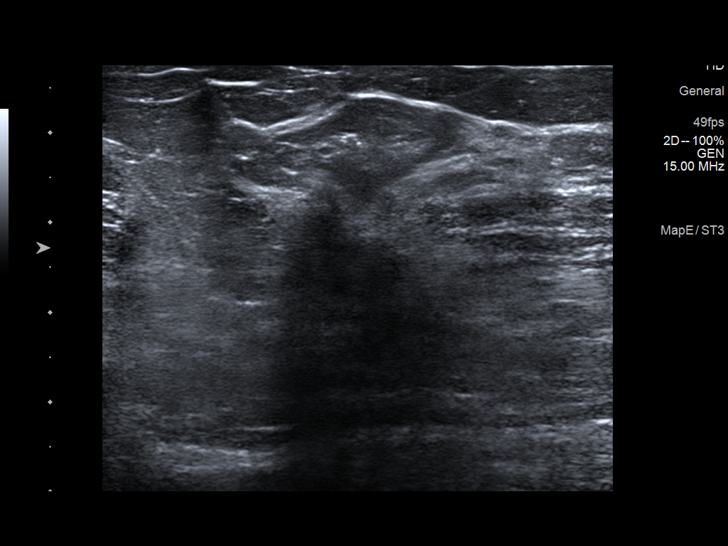
[im 3/22]
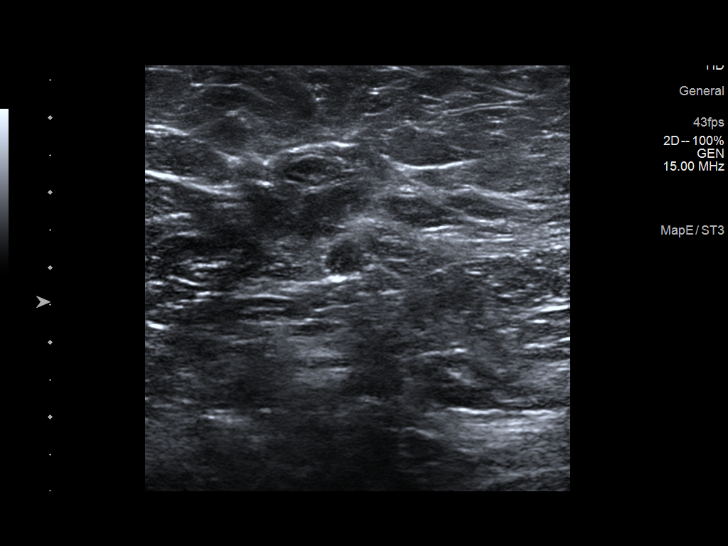
[im 5/22]
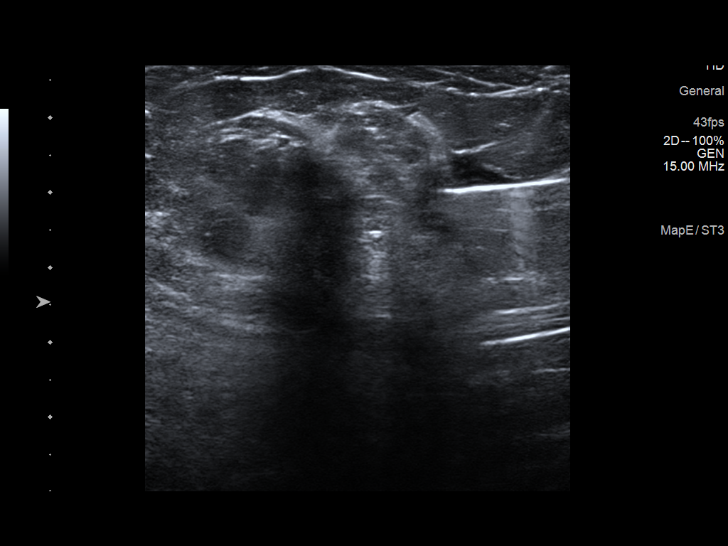
[im 7/22]
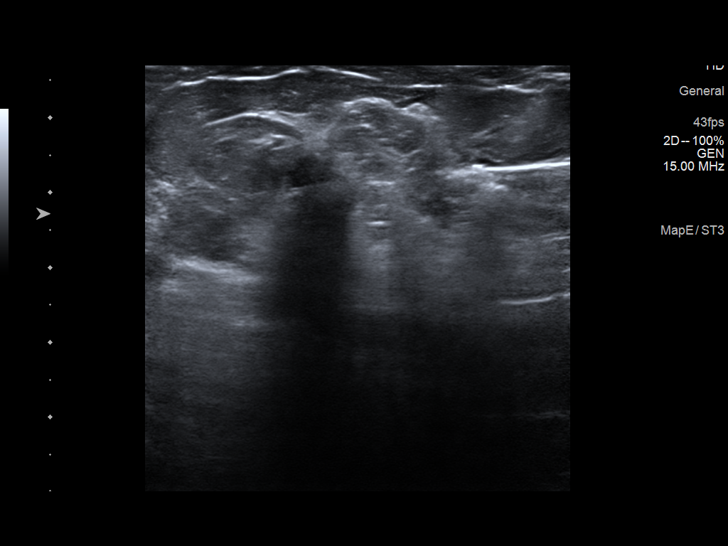
[im 9/22]
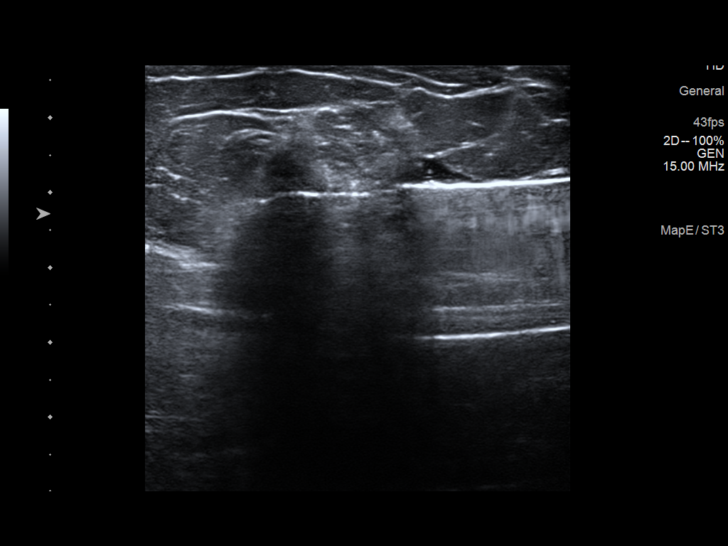
[im 11/22]
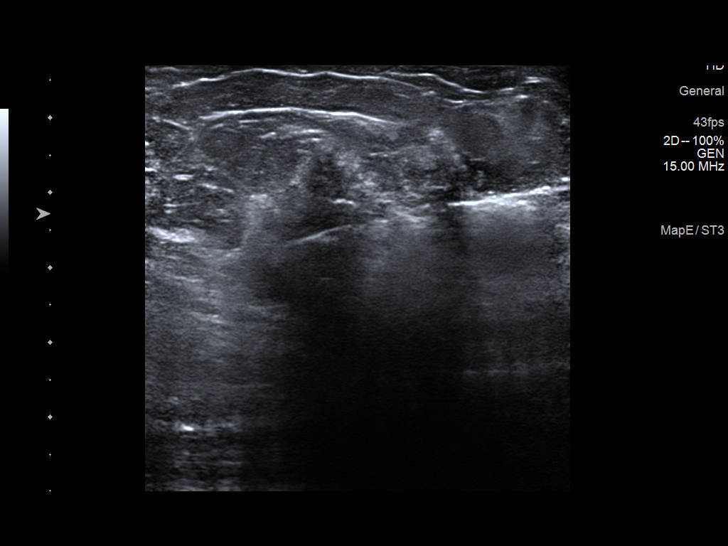
[im 12/22]
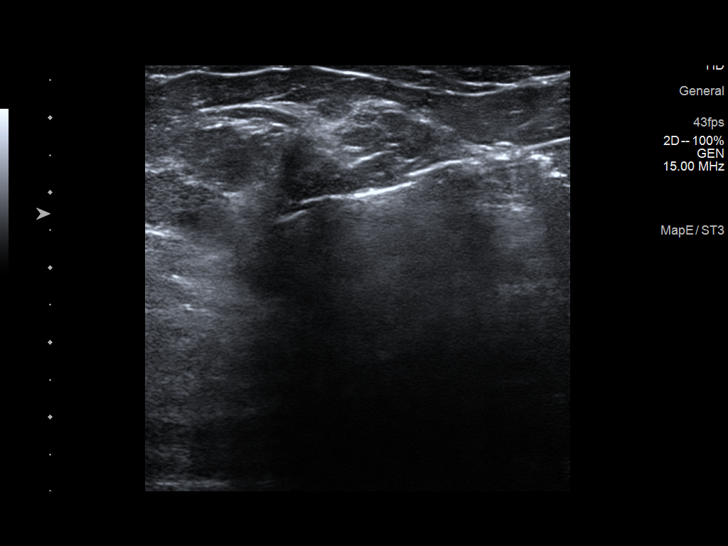
[im 14/22]
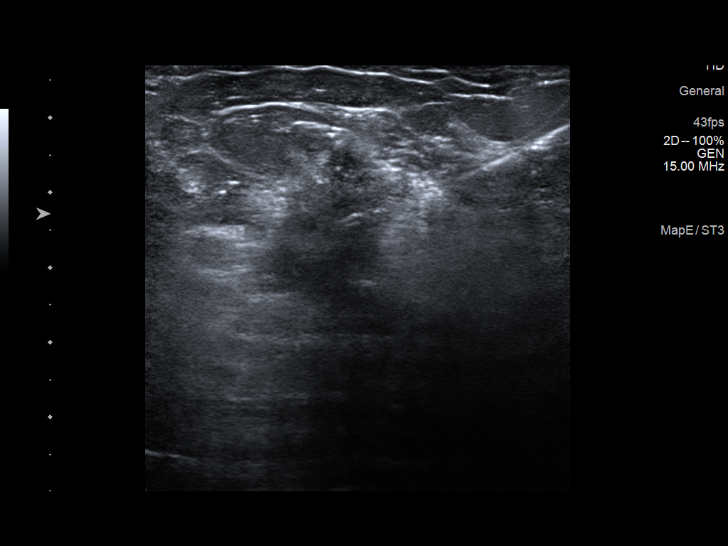
[im 16/22]
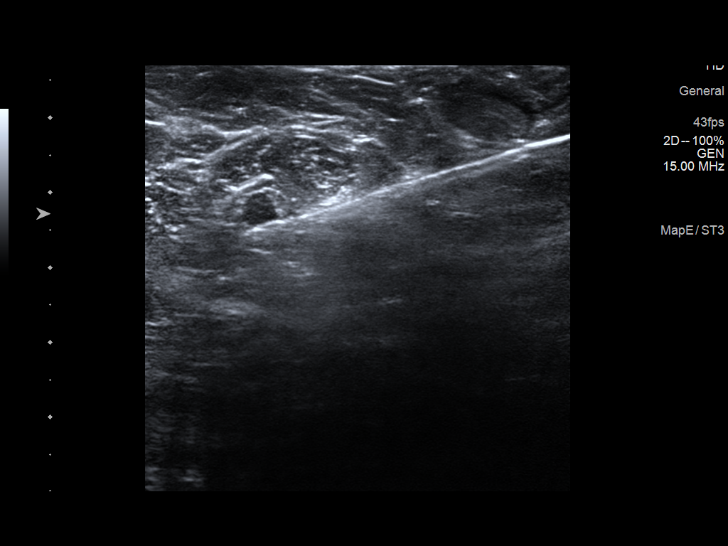
[im 18/22]
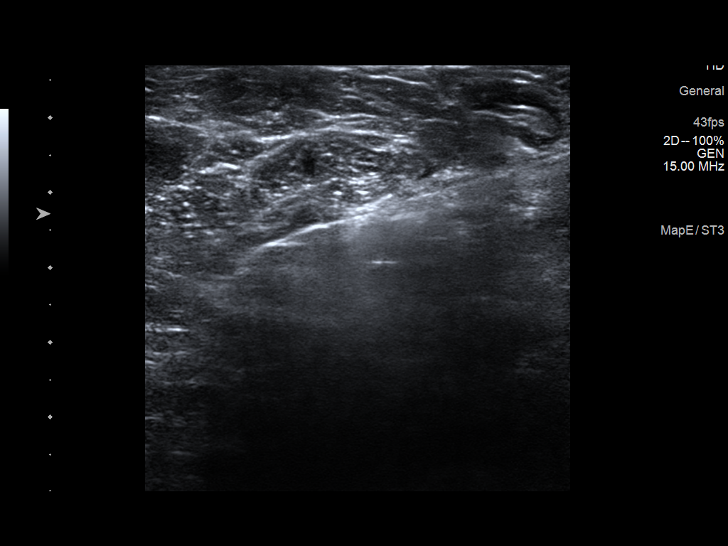
[im 20/22]
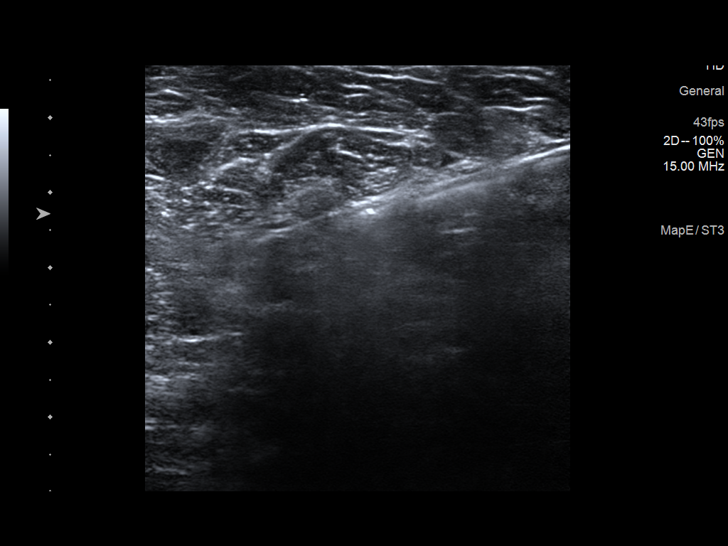
[im 22/22]
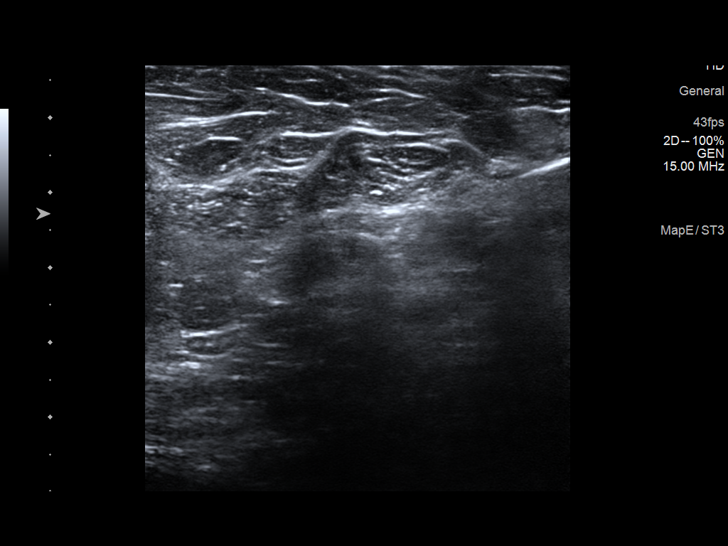

[12 of 22 positions shown; findings below may reference images not displayed]



Site 1, 10 o'clock location of the RIGHT breast 8 centimeters from
the nipple. Ribbon clip. Lesion quadrant: UPPER-OUTER QUADRANT RIGHT
breast

Using sterile technique and 1% Lidocaine as local anesthetic, under
direct ultrasound visualization, a 12 gauge KAO device was
used to perform biopsy of mass in the 10 o'clock location of the
RIGHT breast 8 centimeters from nipple using inferior to superior
approach. At the conclusion of the procedure a ribbon shaped tissue
marker clip was deployed into the biopsy cavity.

Site 2, 10 o'clock location of the RIGHT breast 12 centimeters from
nipple. Coil clip. The QUADRANT: UPPER-OUTER QUADRANT RIGHT breast

Using sterile technique and 1% lidocaine as local anesthetic, under
direct ultrasound visualization, a 12 gauge KAO device was
used to perform biopsy of mass in the 10 o'clock location of the
RIGHT breast 12 centimeters from the nipple using an inferior to
superior approach.

Follow up 2 view mammogram was performed and dictated separately.
IMPRESSION: Ultrasound guided biopsy of 2 sites in the RIGHT breast. No apparent
complications.

ADDENDUM:
Pathology revealed GRADE III INVASIVE DUCTAL CARCINOMA, DUCTAL
CARCINOMA IN SITU, LYMPHOVASCULAR INVASION IS IDENTIFIED of the
RIGHT breast, 10 o'clock, [PQ], (ribbon clip).

METASTATIC CARCINOMA IN 1 OF 1 LYMPH NODE of the RIGHT breast, 10
o'clock, [PQ], (coil clip). This was found to be concordant by Dr.
KAO.

Pathology results were discussed with the patient by telephone. The
patient reported doing well after the biopsies with tenderness at
the sites. Post biopsy instructions and care were reviewed and
questions were answered. The patient was encouraged to call The

The patient was referred to [REDACTED]
[REDACTED] at [REDACTED] on
[DATE].

Pathology results reported by KAO, RN on [DATE].



Site 1, 10 o'clock location of the RIGHT breast 8 centimeters from
the nipple. Ribbon clip. Lesion quadrant: UPPER-OUTER QUADRANT RIGHT
breast

Using sterile technique and 1% Lidocaine as local anesthetic, under
direct ultrasound visualization, a 12 gauge KAO device was
used to perform biopsy of mass in the 10 o'clock location of the
RIGHT breast 8 centimeters from nipple using inferior to superior
approach. At the conclusion of the procedure a ribbon shaped tissue
marker clip was deployed into the biopsy cavity.

Site 2, 10 o'clock location of the RIGHT breast 12 centimeters from
nipple. Coil clip. The QUADRANT: UPPER-OUTER QUADRANT RIGHT breast

Using sterile technique and 1% lidocaine as local anesthetic, under
direct ultrasound visualization, a 12 gauge KAO device was
used to perform biopsy of mass in the 10 o'clock location of the
RIGHT breast 12 centimeters from the nipple using an inferior to
superior approach.

Follow up 2 view mammogram was performed and dictated separately.
IMPRESSION: Ultrasound guided biopsy of 2 sites in the RIGHT breast. No apparent
complications.

## 2019-05-03 ENCOUNTER — Telehealth: Payer: Self-pay | Admitting: Oncology

## 2019-05-03 ENCOUNTER — Encounter: Payer: Self-pay | Admitting: *Deleted

## 2019-05-03 NOTE — Telephone Encounter (Signed)
Spoke to patient to confirm afternoon [facility] appointment for 7/29, packet will be mailed to patient

## 2019-05-07 ENCOUNTER — Other Ambulatory Visit: Payer: Self-pay | Admitting: *Deleted

## 2019-05-07 DIAGNOSIS — C50411 Malignant neoplasm of upper-outer quadrant of right female breast: Secondary | ICD-10-CM

## 2019-05-08 ENCOUNTER — Encounter: Payer: Self-pay | Admitting: Oncology

## 2019-05-08 NOTE — Progress Notes (Signed)
[place]  Telephone:(336) [phone] Fax:(336) [phone]     ID: SHAVY BEACHEM DOB: Oct 13, 1970  MR#: [id number]  JSH#:[id number]  Patient Care Team: Blair Heys, Hershal Coria as PCP - General (Physician Assistant) Mauro Kaufmann, RN as Oncology Nurse Navigator [name], RN as Oncology Nurse Navigator Erroll Luna, MD as Consulting Physician (General Surgery) Atha Muradyan, Virgie Dad, MD as Consulting Physician (Oncology) Kyung Rudd, MD as Consulting Physician (Radiation Oncology) Chauncey Cruel, MD OTHER MD:  CHIEF COMPLAINT: Estrogen receptor positive breast cancer  CURRENT TREATMENT: Definitive surgery pending   HISTORY OF CURRENT ILLNESS: SHALINDA BURKHOLDER had routine screening mammography on 04/27/2019 showing a possible abnormality in the right breast. She underwent right diagnostic mammography with tomography and right breast ultrasonography at [facility] on 05/01/2019 showing: breast density category C; highly suspicious 1.7 cm irregular mass in the right breast at 10 o'clock, 8 cm from the nipple; indeterminate hypoechoic round mass in the right axillary tail/low axilla. Physical exam showed no suspicious lumps.  Accordingly on 05/02/2019 she proceeded to biopsy of the right breast area in question. The pathology from this procedure ([id number]) showed: invasive ductal carcinoma, grade 3; ductal carcinoma in situ; lymphovascular invasion present. Prognostic indicators significant for: estrogen receptor, 30% positive with moderate staining intensity, and progesterone receptor, 30% positive with strong staining intensity. Proliferation marker Ki67 at 20%. HER2 negative by immunohistochemistry (1+).  The second "satellite" lesion was a lymph node which was also positive.  The patient's subsequent history is as detailed below.   INTERVAL HISTORY: Kiwana was evaluated in the multidisciplinary breast cancer clinic on 05/09/2019. Her case was also presented at the  multidisciplinary breast cancer conference on the same day. At that time a preliminary plan was proposed: Mammoprint to be obtained from the initial biopsy; breast conserving surgery with targeted axillary dissection; genetics testing, adjuvant radiation, antiestrogens   REVIEW OF SYSTEMS: Sybilla reports she noticed bruising to her breast at one point a few years ago, but this resolved without issue at the time. There were no specific symptoms leading to the original mammogram, which was routinely scheduled. On patient questionnaire, she reports chills, loss of sleep, fatigue (does not affect activities), right breast and lower back pain, wearing glasses, sleeping with 2 pillows, nausea/vomiting, heartburn, arthritis, and numbness. The patient denies unusual headaches, visual changes, stiff neck, dizziness, or gait imbalance. There has been no cough, phlegm production, or pleurisy, no chest pain or pressure, and no change in bowel or bladder habits. The patient denies fever, rash, bleeding, or unexplained weight loss. A detailed review of systems was otherwise entirely negative.   PAST MEDICAL HISTORY: Past Medical History:  Diagnosis Date   Chronic low back pain with left-sided sciatica    Hypertension     PAST SURGICAL HISTORY: Past Surgical History:  Procedure Laterality Date   BREAST BIOPSY Right 05/02/2019   right clips X2   C/S x2  '98 '03   CESAREAN SECTION     CHOLECYSTECTOMY     LAPAROSCOPIC ASSISTED VAGINAL HYSTERECTOMY  05/17/2011   Procedure: LAPAROSCOPIC ASSISTED VAGINAL HYSTERECTOMY;  Surgeon: Sharene Butters;  Location: [facility];  Service: Gynecology;  Laterality: N/A;  Laparoscopic Assisted Vaginal Hysterectomy With Lysis Of Adhesions   TUBAL LIGATION      FAMILY HISTORY: Family History  Problem Relation Age of Onset   Arthritis Mother    COPD Mother    Hypertension Mother    Hyperlipidemia Mother    Leukemia Brother  Stomach cancer Maternal Grandmother      Patient's parents are currently living. Her father is 73 and her mother is 46 as of 04/2019. The patient denies a family hx of breast or ovarian cancer. She has 7 siblings, 6 brothers and 1 sister. She reports her maternal grandmother was diagnosed with stomach cancer at age 62. Her brother was diagnosed with leukemia at age 32.   GYNECOLOGIC HISTORY:  No LMP recorded. Patient has had a hysterectomy. Menarche: 48 years old Age at first live birth: 48 years old GX P 2 (+1 stillborn) LMP age 82-42 Contraceptive: unsure, maybe for a year at age 31. HRT no Hysterectomy? Yes, 05/2011 BSO? 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 She is married. Husband Sheppard Plumber") is a Hydrologist for a telephone company. She lives at home with her husband and two daughters. Daughter Suszanne Conners, age 73, is a Scientist, water quality. Daughter Jarrett Soho, age 62, is a Ship broker.     ADVANCED DIRECTIVES: Husband Heath Lark is her HCPOA.   HEALTH MAINTENANCE: Social History   Tobacco Use   Smoking status: Never Smoker   Smokeless tobacco: Never Used  Substance Use Topics   Alcohol use: Yes    Alcohol/week: 2.0 standard drinks    Types: 2 Standard drinks or equivalent per week    Comment: "2-3 a week"   Drug use: No     Colonoscopy: n/a  PAP: 02/2018  Bone density: n/a   No Known Allergies  Current Outpatient Medications  Medication Sig Dispense Refill   Cholecalciferol (VITAMIN D3) 25 MCG (1000 UT) CHEW Chew by mouth.     olmesartan-hydrochlorothiazide (BENICAR HCT) 20-12.5 MG tablet Take 1 tablet by mouth daily.     No current facility-administered medications for this visit.     OBJECTIVE: Young white woman in no acute distress  Vitals:   05/09/19 1255  BP: 122/76  Pulse: 79  Resp: 18  Temp: 98.5 F (36.9 C)  SpO2: 100%     Body mass index is 35.52 kg/m.   Wt Readings from Last 3 Encounters:  03/18/12 188 lb (85.3 kg)  05/17/11 185 lb (83.9 kg)   05/10/11 185 lb (83.9 kg)      ECOG FS:1 - Symptomatic but completely ambulatory  Ocular: Sclerae unicteric, pupils round and equal Wearing a mask Lymphatic: No cervical or supraclavicular adenopathy Lungs no rales or rhonchi Heart regular rate and rhythm Abd soft, nontender, positive bowel sounds MSK no focal spinal tenderness, no joint edema Neuro: non-focal, well-oriented, appropriate affect Breasts: The right breast is status post recent biopsy.  There are no skin or nipple changes of concern.  I do not palpate any axillary mass.  The left breast and left axilla are benign   LAB RESULTS:  CMP     Component Value Date/Time   NA 136 05/09/2019 1225   K 4.1 05/09/2019 1225   CL 103 05/09/2019 1225   CO2 24 05/09/2019 1225   GLUCOSE 127 (H) 05/09/2019 1225   BUN 12 05/09/2019 1225   CREATININE 0.83 05/09/2019 1225   CALCIUM 9.6 05/09/2019 1225   PROT 7.2 05/09/2019 1225   ALBUMIN 3.6 05/09/2019 1225   AST 18 05/09/2019 1225   ALT 23 05/09/2019 1225   ALKPHOS 60 05/09/2019 1225   BILITOT 0.7 05/09/2019 1225   GFRNONAA >60 05/09/2019 1225   GFRAA >60 05/09/2019 1225    No results found for: TOTALPROTELP, ALBUMINELP, A1GS, A2GS, BETS, BETA2SER, GAMS, MSPIKE, SPEI  No results found for: Nils Pyle, [facility]  Lab Results  Component Value Date   WBC 9.6 05/09/2019   NEUTROABS 6.5 05/09/2019   HGB 14.8 05/09/2019   HCT 43.0 05/09/2019   MCV 86.3 05/09/2019   PLT 210 05/09/2019    '@LASTCHEMISTRY'$ @  No results found for: LABCA2  No components found for: [id number]  No results for input(s): INR in the last 168 hours.  No results found for: LABCA2  No results found for: ELF810  No results found for: FBP102  No results found for: HEN277  No results found for: CA2729  No components found for: HGQUANT  No results found for: CEA1 / No results found for: CEA1   No results found for: AFPTUMOR  No results found for: CHROMOGRNA  No results  found for: PSA1  Appointment on 05/09/2019  Component Date Value Ref Range Status   Sodium 05/09/2019 136  135 - 145 mmol/L Final   Potassium 05/09/2019 4.1  3.5 - 5.1 mmol/L Final   Chloride 05/09/2019 103  98 - 111 mmol/L Final   CO2 05/09/2019 24  22 - 32 mmol/L Final   Glucose, Bld 05/09/2019 127* 70 - 99 mg/dL Final   BUN 05/09/2019 12  6 - 20 mg/dL Final   Creatinine 05/09/2019 0.83  0.44 - 1.00 mg/dL Final   Calcium 05/09/2019 9.6  8.9 - 10.3 mg/dL Final   Total Protein 05/09/2019 7.2  6.5 - 8.1 g/dL Final   Albumin 05/09/2019 3.6  3.5 - 5.0 g/dL Final   AST 05/09/2019 18  15 - 41 U/L Final   ALT 05/09/2019 23  0 - 44 U/L Final   Alkaline Phosphatase 05/09/2019 60  38 - 126 U/L Final   Total Bilirubin 05/09/2019 0.7  0.3 - 1.2 mg/dL Final   GFR, Est Non Af Am 05/09/2019 >60  >60 mL/min Final   GFR, Est AFR Am 05/09/2019 >60  >60 mL/min Final   Anion gap 05/09/2019 9  5 - 15 Final   Performed at [facility], [place] [address], [place], [place] [address]   WBC Count 05/09/2019 9.6  4.0 - 10.5 K/uL Final   RBC 05/09/2019 4.98  3.87 - 5.11 MIL/uL Final   Hemoglobin 05/09/2019 14.8  12.0 - 15.0 g/dL Final   HCT 05/09/2019 43.0  36.0 - 46.0 % Final   MCV 05/09/2019 86.3  80.0 - 100.0 fL Final   MCH 05/09/2019 29.7  26.0 - 34.0 pg Final   MCHC 05/09/2019 34.4  30.0 - 36.0 g/dL Final   RDW 05/09/2019 11.8  11.5 - 15.5 % Final   Platelet Count 05/09/2019 210  150 - 400 K/uL Final   nRBC 05/09/2019 0.0  0.0 - 0.2 % Final   Neutrophils Relative % 05/09/2019 68  % Final   Neutro Abs 05/09/2019 6.5  1.7 - 7.7 K/uL Final   Lymphocytes Relative 05/09/2019 23  % Final   Lymphs Abs 05/09/2019 2.2  0.7 - 4.0 K/uL Final   Monocytes Relative 05/09/2019 6  % Final   Monocytes Absolute 05/09/2019 0.6  0.1 - 1.0 K/uL Final   Eosinophils Relative 05/09/2019 2  % Final   Eosinophils Absolute 05/09/2019 0.2  0.0 - 0.5 K/uL Final    Basophils Relative 05/09/2019 0  % Final   Basophils Absolute 05/09/2019 0.0  0.0 - 0.1 K/uL Final   Immature Granulocytes 05/09/2019 1  % Final   Abs Immature Granulocytes 05/09/2019 0.08* 0.00 - 0.07 K/uL Final  Performed at [facility], [place] [address], [place], [place] [address]    (this displays the last labs from the last 3 days)  No results found for: TOTALPROTELP, ALBUMINELP, A1GS, A2GS, BETS, BETA2SER, GAMS, MSPIKE, SPEI (this displays SPEP labs)  No results found for: KPAFRELGTCHN, LAMBDASER, KAPLAMBRATIO (kappa/lambda light chains)  No results found for: HGBA, HGBA2QUANT, HGBFQUANT, HGBSQUAN (Hemoglobinopathy evaluation)   No results found for: LDH  No results found for: IRON, TIBC, IRONPCTSAT (Iron and TIBC)  No results found for: FERRITIN  Urinalysis No results found for: COLORURINE, APPEARANCEUR, LABSPEC, PHURINE, GLUCOSEU, HGBUR, BILIRUBINUR, KETONESUR, PROTEINUR, UROBILINOGEN, NITRITE, LEUKOCYTESUR   STUDIES: US Breast Ltd Uni Right Inc Axilla  Result Date: 05/01/2019 CLINICAL DATA:  The patient was called back for a new spiculated mass on the right. EXAM: DIGITAL DIAGNOSTIC RIGHT MAMMOGRAM WITH TOMO ULTRASOUND RIGHT BREAST COMPARISON:  Previous exam(s). ACR Breast Density Category c: The breast tissue is heterogeneously dense, which may obscure small masses. FINDINGS: The spiculated mass in the upper outer right breast persists on additional imaging. On physical exam, no suspicious lumps are identified. Targeted ultrasound is performed, showing an irregular mass in the right breast at 10 o'clock, 8 cm from the nipple correlating with the mammographic finding measuring 1.5 by 1.5 x 1.7 cm. There is a hypoechoic mass in the right axillary tail/low axilla at 10 o'clock, 12 cm from the nipple measuring 6 x 5 by 5 mm, possibly a mildly abnormal lymph node. No other adenopathy seen in the right axilla. IMPRESSION: Highly suspicious irregular mass  in the right breast at 10 o'clock, 8 cm from the nipple. Indeterminate hypoechoic round mass in the right axillary tail/low axilla. RECOMMENDATION: Recommend ultrasound-guided biopsy of the suspicious mass in the right breast at 10 o'clock, 8 cm from the nipple and the round hypoechoic mass in the right axillary tail/low axilla at 10 o'clock, 12 cm from the nipple. I have discussed the findings and recommendations with the patient. Results were also provided in writing at the conclusion of the visit. If applicable, a reminder letter will be sent to the patient regarding the next appointment. BI-RADS CATEGORY  5: Highly suggestive of malignancy. Electronically Signed   By: Dorise Bullion III M.D   On: 05/01/2019 15:58   Mm Diag Breast Tomo Uni Right  Result Date: 05/01/2019 CLINICAL DATA:  The patient was called back for a new spiculated mass on the right. EXAM: DIGITAL DIAGNOSTIC RIGHT MAMMOGRAM WITH TOMO ULTRASOUND RIGHT BREAST COMPARISON:  Previous exam(s). ACR Breast Density Category c: The breast tissue is heterogeneously dense, which may obscure small masses. FINDINGS: The spiculated mass in the upper outer right breast persists on additional imaging. On physical exam, no suspicious lumps are identified. Targeted ultrasound is performed, showing an irregular mass in the right breast at 10 o'clock, 8 cm from the nipple correlating with the mammographic finding measuring 1.5 by 1.5 x 1.7 cm. There is a hypoechoic mass in the right axillary tail/low axilla at 10 o'clock, 12 cm from the nipple measuring 6 x 5 by 5 mm, possibly a mildly abnormal lymph node. No other adenopathy seen in the right axilla. IMPRESSION: Highly suspicious irregular mass in the right breast at 10 o'clock, 8 cm from the nipple. Indeterminate hypoechoic round mass in the right axillary tail/low axilla. RECOMMENDATION: Recommend ultrasound-guided biopsy of the suspicious mass in the right breast at 10 o'clock, 8 cm from the nipple and the  round hypoechoic mass in the right axillary tail/low  axilla at 10 o'clock, 12 cm from the nipple. I have discussed the findings and recommendations with the patient. Results were also provided in writing at the conclusion of the visit. If applicable, a reminder letter will be sent to the patient regarding the next appointment. BI-RADS CATEGORY  5: Highly suggestive of malignancy. Electronically Signed   By: Dorise Bullion III M.D   On: 05/01/2019 15:58   Mm Clip Placement Right  Result Date: 05/02/2019 CLINICAL DATA:  Status post ultrasound-guided core biopsy of 2 masses in the RIGHT breast. EXAM: DIAGNOSTIC LEFT MAMMOGRAM POST ULTRASOUND BIOPSY x2 COMPARISON:  Previous exam(s). FINDINGS: Mammographic images were obtained following ultrasound guided biopsy of mass in the 10 o'clock location of the RIGHT breast and placement of a ribbon shaped clip. The ribbon shaped clip is identified in the UPPER-OUTER QUADRANT of the RIGHT breast as expected. Following biopsy of mass in the 10 o'clock location 12 centimeters from the nipple and placement of a coil shaped clip, the clip is in expected location in the UPPER-OUTER QUADRANT RIGHT breast/LOWER RIGHT axilla. IMPRESSION: Tissue marker clips are in the expected locations after biopsy. Final Assessment: Post Procedure Mammograms for Marker Placement Electronically Signed   By: Nolon Nations M.D.   On: 05/02/2019 11:22   [place] Rt Breast Bx W Loc Dev 1st Lesion Img Bx Spec US Guide  Addendum Date: 05/03/2019   ADDENDUM REPORT: 05/03/2019 14:02 ADDENDUM: Pathology revealed GRADE III INVASIVE DUCTAL CARCINOMA, DUCTAL CARCINOMA IN SITU, LYMPHOVASCULAR INVASION IS IDENTIFIED of the RIGHT breast, 10 o'clock, 8cmfn, (ribbon clip). METASTATIC CARCINOMA IN 1 OF 1 LYMPH NODE of the RIGHT breast, 10 o'clock, 12cmfn, (coil clip). This was found to be concordant by Dr. Nolon Nations. Pathology results were discussed with the patient by telephone. The patient reported doing  well after the biopsies with tenderness at the sites. Post biopsy instructions and care were reviewed and questions were answered. The patient was encouraged to call [facility] for any additional concerns. The patient was referred to [facility] on May 09, 2019. Pathology results reported by Terie Purser, RN on 05/03/2019. Electronically Signed   By: Nolon Nations M.D.   On: 05/03/2019 14:02   Result Date: 05/03/2019 CLINICAL DATA:  The patient presents for ultrasound-guided core biopsy of 2 masses in the RIGHT breast. EXAM: ULTRASOUND GUIDED RIGHT BREAST CORE NEEDLE BIOPSY x2 COMPARISON:  Previous exam(s). FINDINGS: I met with the patient and we discussed the procedure of ultrasound-guided biopsy, including benefits and alternatives. We discussed the high likelihood of a successful procedure. We discussed the risks of the procedure, including infection, bleeding, tissue injury, clip migration, and inadequate sampling. Informed written consent was given. The usual time-out protocol was performed immediately prior to the procedure. Site 1, 10 o'clock location of the RIGHT breast 8 centimeters from the nipple. Ribbon clip. Lesion quadrant: UPPER-OUTER QUADRANT RIGHT breast Using sterile technique and 1% Lidocaine as local anesthetic, under direct ultrasound visualization, a 12 gauge spring-loaded device was used to perform biopsy of mass in the 10 o'clock location of the RIGHT breast 8 centimeters from nipple using inferior to superior approach. At the conclusion of the procedure a ribbon shaped tissue marker clip was deployed into the biopsy cavity. Site 2, 10 o'clock location of the RIGHT breast 12 centimeters from nipple. Coil clip. The QUADRANT: UPPER-OUTER QUADRANT RIGHT breast Using sterile technique and 1% lidocaine as local anesthetic, under direct ultrasound  visualization, a 12 gauge spring-loaded  device was used to perform biopsy of mass in the 10 o'clock location of the RIGHT breast 12 centimeters from the nipple using an inferior to superior approach. Follow up 2 view mammogram was performed and dictated separately. IMPRESSION: Ultrasound guided biopsy of 2 sites in the RIGHT breast. No apparent complications. Electronically Signed: By: Nolon Nations M.D. On: 05/02/2019 11:21   [place] Rt Breast Bx W Loc Dev Ea Add Lesion Img Bx Spec US Guide  Addendum Date: 05/03/2019   ADDENDUM REPORT: 05/03/2019 14:02 ADDENDUM: Pathology revealed GRADE III INVASIVE DUCTAL CARCINOMA, DUCTAL CARCINOMA IN SITU, LYMPHOVASCULAR INVASION IS IDENTIFIED of the RIGHT breast, 10 o'clock, 8cmfn, (ribbon clip). METASTATIC CARCINOMA IN 1 OF 1 LYMPH NODE of the RIGHT breast, 10 o'clock, 12cmfn, (coil clip). This was found to be concordant by Dr. Nolon Nations. Pathology results were discussed with the patient by telephone. The patient reported doing well after the biopsies with tenderness at the sites. Post biopsy instructions and care were reviewed and questions were answered. The patient was encouraged to call [facility] for any additional concerns. The patient was referred to [facility] on May 09, 2019. Pathology results reported by Terie Purser, RN on 05/03/2019. Electronically Signed   By: Nolon Nations M.D.   On: 05/03/2019 14:02   Result Date: 05/03/2019 CLINICAL DATA:  The patient presents for ultrasound-guided core biopsy of 2 masses in the RIGHT breast. EXAM: ULTRASOUND GUIDED RIGHT BREAST CORE NEEDLE BIOPSY x2 COMPARISON:  Previous exam(s). FINDINGS: I met with the patient and we discussed the procedure of ultrasound-guided biopsy, including benefits and alternatives. We discussed the high likelihood of a successful procedure. We discussed the risks of the procedure, including infection, bleeding, tissue  injury, clip migration, and inadequate sampling. Informed written consent was given. The usual time-out protocol was performed immediately prior to the procedure. Site 1, 10 o'clock location of the RIGHT breast 8 centimeters from the nipple. Ribbon clip. Lesion quadrant: UPPER-OUTER QUADRANT RIGHT breast Using sterile technique and 1% Lidocaine as local anesthetic, under direct ultrasound visualization, a 12 gauge spring-loaded device was used to perform biopsy of mass in the 10 o'clock location of the RIGHT breast 8 centimeters from nipple using inferior to superior approach. At the conclusion of the procedure a ribbon shaped tissue marker clip was deployed into the biopsy cavity. Site 2, 10 o'clock location of the RIGHT breast 12 centimeters from nipple. Coil clip. The QUADRANT: UPPER-OUTER QUADRANT RIGHT breast Using sterile technique and 1% lidocaine as local anesthetic, under direct ultrasound visualization, a 12 gauge spring-loaded device was used to perform biopsy of mass in the 10 o'clock location of the RIGHT breast 12 centimeters from the nipple using an inferior to superior approach. Follow up 2 view mammogram was performed and dictated separately. IMPRESSION: Ultrasound guided biopsy of 2 sites in the RIGHT breast. No apparent complications. Electronically Signed: By: Nolon Nations M.D. On: 05/02/2019 11:21    ELIGIBLE FOR AVAILABLE RESEARCH PROTOCOL: no  ASSESSMENT: 48 y.o. [place], [place] woman status post right breast upper outer quadrant biopsy 05/01/2019 for a clinical T1c N1, stage IIA invasive ductal carcinoma, grade 3, estrogen and progesterone receptor positive, HER-2 nonamplified, with an MIB-1-1 of 20%.  (a) mass in the axillary tail was a positive lymph node  (1) MammaPrint to be obtained from the original biopsy  (2) genetics testing  (3) breast conserving surgery with  targeted axillary dissection planned  (4) adjuvant radiation  (5) antiestrogens  PLAN: I spent  approximately 60 minutes face to face with Dreonna with more than 50% of that time spent in counseling and coordination of care. Specifically we reviewed the biology of the patient's diagnosis and the specifics of her situation.  We first reviewed the fact that cancer is not one disease but more than 100 different diseases and that it is important to keep them separate-- otherwise when friends and relatives discuss their own cancer experiences with Daija confusion can result. Similarly we explained that if breast cancer spreads to the bone or liver, the patient would not have bone cancer or liver cancer, but breast cancer in the bone and breast cancer in the liver: one cancer in three places-- not 3 different cancers which otherwise would have to be treated in 3 different ways.  We discussed the difference between local and systemic therapy. In terms of loco-regional treatment, lumpectomy plus radiation is equivalent to mastectomy as far as survival is concerned. For this reason, and because the cosmetic results are generally superior, we recommend breast conserving surgery.   We then discussed the rationale for systemic therapy. There is some risk that this cancer may have already spread to other parts of her body. Patients frequently ask at this point about bone scans, CAT scans and PET scans to find out if they have occult breast cancer somewhere else. The problem is that in early stage disease we are much more likely to find false positives then true cancers and this would expose the patient to unnecessary procedures as well as unnecessary radiation. Scans cannot answer the question the patient really would like to know, which is whether she has microscopic disease elsewhere in her body. For those reasons we do not recommend them.  Of course we would proceed to aggressive evaluation of any symptoms that might suggest metastatic disease, but that is not the case here.  Next we went over the options for systemic  therapy which are anti-estrogens, anti-HER-2 immunotherapy, and chemotherapy. Mabeline does not meet criteria for anti-HER-2 immunotherapy. She is a good candidate for anti-estrogens.  The question of chemotherapy is more complicated.  In a young woman with a grade 3 breast cancer and a positive lymph node, the likelihood of chemotherapy is great.  Nevertheless we will check a MammaPrint from the original biopsy and if high risk will proceed to port placement at the time of definitive surgery, followed by chemotherapy.  Peggy also qualifies for genetics testing. In patients who carry a deleterious mutation [for example in a  BRCA gene], the risk of a new breast cancer developing in the future may be sufficiently great that the patient may choose bilateral mastectomies. However if she wishes to keep her breasts in that situation it is safe to do so. That would require intensified screening, which generally means not only yearly mammography but a yearly breast MRI as well.  Carsen is very clear in her mind that even if she carries a deleterious mutation she would opt for intensified screening, not bilateral mastectomies.  Estle has a good understanding of the overall plan. She agrees with it. She knows the goal of treatment in her case is cure. She will call with any problems that may develop before her next visit here.   Chauncey Cruel, MD   05/09/2019 2:51 PM Medical Oncology and Hematology [facility] [address] [place], [place] [address] Tel. [phone]  Fax. [phone]   This document serves as a record of services personally performed by Lurline Del, MD. It was created on his behalf by Wilburn Mylar, a trained medical scribe. The creation of this record is based on the scribe's personal observations and the provider's statements to them.   I, Lurline Del MD, have reviewed the above documentation for accuracy and completeness, and I agree with the above.

## 2019-05-09 ENCOUNTER — Encounter: Payer: Self-pay | Admitting: Genetic Counselor

## 2019-05-09 ENCOUNTER — Encounter: Payer: Self-pay | Admitting: Physical Therapy

## 2019-05-09 ENCOUNTER — Ambulatory Visit: Payer: 59 | Attending: Surgery | Admitting: Physical Therapy

## 2019-05-09 ENCOUNTER — Telehealth: Payer: Self-pay | Admitting: *Deleted

## 2019-05-09 ENCOUNTER — Ambulatory Visit
Admission: RE | Admit: 2019-05-09 | Discharge: 2019-05-09 | Disposition: A | Discharge status: Home / Self Care | Payer: 59 | Source: Ambulatory Visit | Attending: Radiation Oncology | Admitting: Radiation Oncology

## 2019-05-09 ENCOUNTER — Ambulatory Visit (HOSPITAL_BASED_OUTPATIENT_CLINIC_OR_DEPARTMENT_OTHER): Payer: 59 | Admitting: Genetic Counselor

## 2019-05-09 ENCOUNTER — Inpatient Hospital Stay: Payer: 59

## 2019-05-09 ENCOUNTER — Encounter: Payer: Self-pay | Admitting: Oncology

## 2019-05-09 ENCOUNTER — Inpatient Hospital Stay: Payer: 59 | Attending: Oncology | Admitting: Oncology

## 2019-05-09 ENCOUNTER — Ambulatory Visit: Payer: Self-pay | Admitting: Surgery

## 2019-05-09 ENCOUNTER — Other Ambulatory Visit: Payer: Self-pay

## 2019-05-09 VITALS — BP 122/76 | HR 79 | Temp 98.5°F | Resp 18 | Ht 61.0 in

## 2019-05-09 DIAGNOSIS — Z170 Estrogen receptor positive status [ER+]: Secondary | ICD-10-CM

## 2019-05-09 DIAGNOSIS — C773 Secondary and unspecified malignant neoplasm of axilla and upper limb lymph nodes: Secondary | ICD-10-CM

## 2019-05-09 DIAGNOSIS — Z806 Family history of leukemia: Secondary | ICD-10-CM

## 2019-05-09 DIAGNOSIS — C50421 Malignant neoplasm of upper-outer quadrant of right male breast: Secondary | ICD-10-CM

## 2019-05-09 DIAGNOSIS — R293 Abnormal posture: Secondary | ICD-10-CM | POA: Diagnosis present

## 2019-05-09 DIAGNOSIS — C50411 Malignant neoplasm of upper-outer quadrant of right female breast: Secondary | ICD-10-CM | POA: Insufficient documentation

## 2019-05-09 DIAGNOSIS — Z800 Family history of malignant neoplasm of digestive organs: Secondary | ICD-10-CM

## 2019-05-09 LAB — CMP (CANCER CENTER ONLY)
ALT: 23 U/L (ref 0–44)
AST: 18 U/L (ref 15–41)
Albumin: 3.6 g/dL (ref 3.5–5.0)
Alkaline Phosphatase: 60 U/L (ref 38–126)
Anion gap: 9 (ref 5–15)
BUN: 12 mg/dL (ref 6–20)
CO2: 24 mmol/L (ref 22–32)
Calcium: 9.6 mg/dL (ref 8.9–10.3)
Chloride: 103 mmol/L (ref 98–111)
Creatinine: 0.83 mg/dL (ref 0.44–1.00)
GFR, Est AFR Am: >60 mL/min (ref >60)
GFR, Estimated: >60 mL/min (ref >60)
Glucose, Bld: 127 mg/dL — ABNORMAL HIGH (ref 70–99)
Potassium: 4.1 mmol/L (ref 3.5–5.1)
Sodium: 136 mmol/L (ref 135–145)
Total Bilirubin: 0.7 mg/dL (ref 0.3–1.2)
Total Protein: 7.2 g/dL (ref 6.5–8.1)

## 2019-05-09 LAB — CBC WITH DIFFERENTIAL (CANCER CENTER ONLY)
Abs Immature Granulocytes: 0.08 10*3/uL — ABNORMAL HIGH (ref 0.00–0.07)
Basophils Absolute: 0 10*3/uL (ref 0.0–0.1)
Basophils Relative: 0 %
Eosinophils Absolute: 0.2 10*3/uL (ref 0.0–0.5)
Eosinophils Relative: 2 %
HCT: 43 % (ref 36.0–46.0)
Hemoglobin: 14.8 g/dL (ref 12.0–15.0)
Immature Granulocytes: 1 %
Lymphocytes Relative: 23 %
Lymphs Abs: 2.2 10*3/uL (ref 0.7–4.0)
MCH: 29.7 pg (ref 26.0–34.0)
MCHC: 34.4 g/dL (ref 30.0–36.0)
MCV: 86.3 fL (ref 80.0–100.0)
Monocytes Absolute: 0.6 10*3/uL (ref 0.1–1.0)
Monocytes Relative: 6 %
Neutro Abs: 6.5 10*3/uL (ref 1.7–7.7)
Neutrophils Relative %: 68 %
Platelet Count: 210 10*3/uL (ref 150–400)
RBC: 4.98 MIL/uL (ref 3.87–5.11)
RDW: 11.8 % (ref 11.5–15.5)
WBC Count: 9.6 10*3/uL (ref 4.0–10.5)
nRBC: 0 % (ref 0.0–0.2)

## 2019-05-09 MED ORDER — KETOCONAZOLE 2 % EX CREA: 1.0000 "application " | TOPICAL_CREAM | Freq: Every day | CUTANEOUS | 0 refills | Status: DC

## 2019-05-09 NOTE — H&P (Signed)
Veronica [name] Documented: 05/09/2019 7:23 AM Location: [facility] Patient #: [id number] DOB: Aug 09, 1971 Married / Language: English / Race: White Female  History of Present Illness Veronica Moores A. Breslin Hemann MD; 05/09/2019 2:21 PM) Patient words: Pt sent at the request of Dr Lisbeth Renshaw for mass in right breast UOQ. She reports some stinging pain and discoloration off and on for a year. Mammogram and U/S showed 1.7 cm mass and second mass in tail of breast core bx IDC grade 3 ER PO PR POS HER 2 NEU NEGATIVE ki 67 20 %.  The patient is a 48 year old female.   Past Surgical History Tawni Pummel, RN; 05/09/2019 7:24 AM) Breast Biopsy Right. Gallbladder Surgery - Laparoscopic Hysterectomy (not due to cancer) - Partial Oral Surgery  Diagnostic Studies History Tawni Pummel, RN; 05/09/2019 7:24 AM) Colonoscopy never Mammogram within last year Pap Smear 1-5 years ago  Medication History Tawni Pummel, RN; 05/09/2019 7:24 AM) Medications Reconciled  Social History Tawni Pummel, RN; 05/09/2019 7:24 AM) Alcohol use Occasional alcohol use. Caffeine use Carbonated beverages, Coffee. No drug use Tobacco use Never smoker.  Family History Tawni Pummel, RN; 05/09/2019 7:24 AM) Anesthetic complications Daughter. Arthritis Mother. Cancer Brother, Family Members In General. Cerebrovascular Accident Brother. Diabetes Mellitus Brother. Hypertension Mother. Ischemic Bowel Disease Daughter. Migraine Headache Daughter, Mother. Respiratory Condition Sister.  Pregnancy / Birth History Tawni Pummel, RN; 05/09/2019 7:24 AM) Age at menarche 81 years. Gravida 4 Irregular periods Maternal age 73-20 Para 2  Other Problems Tawni Pummel, RN; 05/09/2019 7:24 AM) High blood pressure     Review of Systems (Dimas Scheck A. Mailynn Everly MD; 05/09/2019 2:21 PM) General Not Present- Appetite Loss, Chills, Fatigue, Fever, Night Sweats, Weight Gain and Weight Loss. Skin Not  Present- Change in Wart/Mole, Dryness, Hives, Jaundice, New Lesions, Non-Healing Wounds, Rash and Ulcer. HEENT Not Present- Earache, Hearing Loss, Hoarseness, Nose Bleed, Oral Ulcers, Ringing in the Ears, Seasonal Allergies, Sinus Pain, Sore Throat, Visual Disturbances, Wears glasses/contact lenses and Yellow Eyes. Respiratory Not Present- Bloody sputum, Chronic Cough, Difficulty Breathing, Snoring and Wheezing. Breast Not Present- Breast Mass, Breast Pain, Nipple Discharge and Skin Changes. Cardiovascular Not Present- Chest Pain, Difficulty Breathing Lying Down, Leg Cramps, Palpitations, Rapid Heart Rate, Shortness of Breath and Swelling of Extremities. Gastrointestinal Not Present- Abdominal Pain, Bloating, Bloody Stool, Change in Bowel Habits, Chronic diarrhea, Constipation, Difficulty Swallowing, Excessive gas, Gets full quickly at meals, Hemorrhoids, Indigestion, Nausea, Rectal Pain and Vomiting. Female Genitourinary Not Present- Frequency, Nocturia, Painful Urination, Pelvic Pain and Urgency. Musculoskeletal Not Present- Back Pain, Joint Pain, Joint Stiffness, Muscle Pain, Muscle Weakness and Swelling of Extremities. Neurological Not Present- Decreased Memory, Fainting, Headaches, Numbness, Seizures, Tingling, Tremor, Trouble walking and Weakness. Psychiatric Not Present- Anxiety, Bipolar, Change in Sleep Pattern, Depression, Fearful and Frequent crying. Endocrine Not Present- Cold Intolerance, Excessive Hunger, Hair Changes, Heat Intolerance, Hot flashes and New Diabetes. Hematology Not Present- Blood Thinners, Easy Bruising, Excessive bleeding, Gland problems, HIV and Persistent Infections. All other systems negative   Physical Exam (Sheree Lalla A. Alexismarie Flaim MD; 05/09/2019 2:22 PM)  General Mental Status-Alert. General Appearance-Consistent with stated age. Hydration-Well hydrated. Voice-Normal.  Head and Neck Head-normocephalic, atraumatic with no lesions or palpable  masses. Trachea-midline. Thyroid Gland Characteristics - normal size and consistency.  Chest and Lung Exam Chest and lung exam reveals -quiet, even and easy respiratory effort with no use of accessory muscles and on auscultation, normal breath sounds, no adventitious sounds and normal vocal resonance. Inspection Chest Wall - Normal. Back - normal.  Breast  Breast - Left-Symmetric, Non Tender, No Biopsy scars, no Dimpling - Left, No Inflammation, No Lumpectomy scars, No Mastectomy scars, No Peau d' Orange. Breast - Right-Symmetric, Non Tender, No Biopsy scars, no Dimpling - Right, No Inflammation, No Lumpectomy scars, No Mastectomy scars, No Peau d' Orange. Breast Lump-No Palpable Breast Mass. Note: mild right breast bruising RUOQ  Cardiovascular Cardiovascular examination reveals -normal heart sounds, regular rate and rhythm with no murmurs and normal pedal pulses bilaterally.  Neurologic Neurologic evaluation reveals -alert and oriented x 3 with no impairment of recent or remote memory. Mental Status-Normal.  Musculoskeletal Normal Exam - Left-Upper Extremity Strength Normal and Lower Extremity Strength Normal. Normal Exam - Right-Upper Extremity Strength Normal and Lower Extremity Strength Normal.  Lymphatic Head & Neck  General Head & Neck Lymphatics: Bilateral - Description - Normal. Axillary  General Axillary Region: Bilateral - Description - Normal. Tenderness - Non Tender.    Assessment & Plan (Mikia Delaluz A. Kiaan Overholser MD; 05/09/2019 2:23 PM)  BREAST CANCER, RIGHT (C50.911) Impression: PT opted for right breast lumpectomy and right sentinel lymph node mapping Risk of lumpectomy include bleeding, infection, seroma, more surgery, use of seed/wire, wound care, cosmetic deformity and the need for other treatments, death , blood clots, death. Pt agrees to proceed. Risk of sentinel lymph node mapping include bleeding, infection, lymphedema, shoulder pain.  stiffness, dye allergy. cosmetic deformity , blood clots, death, need for more surgery. Pt agrees to proceed.    GENETICS  Current Plans You are being scheduled for surgery- Our schedulers will call you.  You should hear from our office's scheduling department within 5 working days about the location, date, and time of surgery. We try to make accommodations for patient's preferences in scheduling surgery, but sometimes the OR schedule or the surgeon's schedule prevents [place] from making those accommodations.  If you have not heard from our office [phone]) in 5 working days, call the office and ask for your surgeon's nurse.  If you have other questions about your diagnosis, plan, or surgery, call the office and ask for your surgeon's nurse.  Pt Education - [facility] Breast Cancer Information Given - [facility] "Breast Journey" Package Pt Education - Pamphlet Given - Breast Biopsy: discussed with patient and provided information. We discussed the staging and pathophysiology of breast cancer. We discussed all of the different options for treatment for breast cancer including surgery, chemotherapy, radiation therapy, Herceptin, and antiestrogen therapy. We discussed a sentinel lymph node biopsy as she does not appear to having lymph node involvement right now. We discussed the performance of that with injection of radioactive tracer and blue dye. We discussed that she would have an incision underneath her axillary hairline. We discussed that there is a bout a 10-20% chance of having a positive node with a sentinel lymph node biopsy and we will await the permanent pathology to make any other first further decisions in terms of her treatment. One of these options might be to return to the operating room to perform an axillary lymph node dissection. We discussed about a 1-2% risk lifetime of chronic shoulder pain as well as lymphedema associated with a sentinel lymph node biopsy. We discussed the options  for treatment of the breast cancer which included lumpectomy versus a mastectomy. We discussed the performance of the lumpectomy with a wire placement. We discussed a 10-20% chance of a positive margin requiring reexcision in the operating room. We also discussed that she may need radiation therapy or antiestrogen therapy or both if she undergoes  lumpectomy. We discussed the mastectomy and the postoperative care for that as well. We discussed that there is no difference in her survival whether she undergoes lumpectomy with radiation therapy or antiestrogen therapy versus a mastectomy. There is a slight difference in the local recurrence rate being 3-5% with lumpectomy and about 1% with a mastectomy. We discussed the risks of operation including bleeding, infection, possible reoperation. She understands her further therapy will be based on what her stages at the time of her operation.  Pt Education - flb breast cancer surgery: discussed with patient and provided information. Pt Education - [facility] Breast Biopsy HCI: discussed with patient and provided information. Pt Education - ABC (After Breast Cancer) Class Info: discussed with patient and provided information. Pt Education - [facility] Breast Pains Education

## 2019-05-09 NOTE — Therapy (Signed)
[place], [place], [address] Phone: [phone]   Fax:  [phone]  Physical Therapy Evaluation  Patient Details  Name: Veronica [name] MRN: [id number] Date of Birth: 11/21/1970 Referring Provider (PT): Dr. Erroll Luna   Encounter Date: 05/09/2019  PT End of Session - 05/09/19 1700    Visit Number  1    Number of Visits  2    Date for PT Re-Evaluation  07/04/19    PT Start Time  1308    PT Stop Time  1331    PT Time Calculation (min)  23 min    Activity Tolerance  Patient tolerated treatment well    Behavior During Therapy  [facility] for tasks assessed/performed       Past Medical History:  Diagnosis Date  . Chronic low back pain with left-sided sciatica   . Family history of leukemia   . Family history of stomach cancer   . Hypertension     Past Surgical History:  Procedure Laterality Date  . BREAST BIOPSY Right 05/02/2019   right clips X2  . C/S x2  '98 '03  . CESAREAN SECTION    . CHOLECYSTECTOMY    . LAPAROSCOPIC ASSISTED VAGINAL HYSTERECTOMY  05/17/2011   Procedure: LAPAROSCOPIC ASSISTED VAGINAL HYSTERECTOMY;  Surgeon: Sharene Butters;  Location: [facility];  Service: Gynecology;  Laterality: N/A;  Laparoscopic Assisted Vaginal Hysterectomy With Lysis Of Adhesions  . TUBAL LIGATION      There were no vitals filed for this visit.   Subjective Assessment - 05/09/19 1654    Subjective  Patient reports she is here today to be seen by her medical team for her newly diagnosed right breast cancer.    Pertinent History  Patient was diagnosed on 04/26/2019 with right grade III invasive ductal carcinoma breast cancer. It measures 1.7 cm and is located in the upper outer quadrant. It is ER/PR positive and HER2 negative with a Ki67 of 20%. She has a positive axillary lymph node.    Patient Stated Goals  Reduce lymphedema risk and learn post op shoulder ROM HEP    Currently in Pain?  No/denies          [facility] PT Assessment - 05/09/19 0001      Assessment   Medical Diagnosis  Right breast cancer    Referring Provider (PT)  Dr. Marcello Moores Cornett    Onset Date/Surgical Date  04/26/19    Hand Dominance  Right    Prior Therapy  none      Precautions   Precautions  Other (comment)    Precaution Comments  Active cancer      Restrictions   Weight Bearing Restrictions  No      Balance Screen   Has the patient fallen in the past 6 months  No    Has the patient had a decrease in activity level because of a fear of falling?   No    Is the patient reluctant to leave their home because of a fear of falling?   No      Home Environment   Living Environment  Private residence    Living Arrangements  Spouse/significant other;Children   Husband, 27 and 69 y.o. daughters   Available Help at Discharge  Family      Prior Function   Level of Independence  Independent    Vocation  Unemployed    Vocation Requirements  Babysits sometimes    Leisure  She walks  15 min 2-3x/week      Cognition   Overall Cognitive Status  Within Functional Limits for tasks assessed      Posture/Postural Control   Posture/Postural Control  Postural limitations    Postural Limitations  Rounded Shoulders;Forward head      ROM / Strength   AROM / PROM / Strength  AROM;Strength      AROM   Overall AROM Comments  Cervical AROM is WNL    AROM Assessment Site  Shoulder    Right/Left Shoulder  Right;Left    Right Shoulder Extension  55 Degrees    Right Shoulder Flexion  149 Degrees    Right Shoulder ABduction  159 Degrees    Right Shoulder Internal Rotation  70 Degrees    Right Shoulder External Rotation  83 Degrees    Left Shoulder Extension  58 Degrees    Left Shoulder Flexion  151 Degrees    Left Shoulder ABduction  164 Degrees    Left Shoulder Internal Rotation  51 Degrees    Left Shoulder External Rotation  85 Degrees      Strength   Overall Strength  Within functional limits for tasks performed         LYMPHEDEMA/ONCOLOGY QUESTIONNAIRE - 05/09/19 1659      Type   Cancer Type  Right breast cancer      Lymphedema Assessments   Lymphedema Assessments  Upper extremities      Right Upper Extremity Lymphedema   10 cm Proximal to Olecranon Process  35.5 cm    Olecranon Process  28.1 cm    10 cm Proximal to Ulnar Styloid Process  26.7 cm    Just Proximal to Ulnar Styloid Process  19.6 cm    Across Hand at [employer]  19.7 cm    At [place] of 2nd Digit  7 cm      Left Upper Extremity Lymphedema   10 cm Proximal to Olecranon Process  35.3 cm    Olecranon Process  27.1 cm    10 cm Proximal to Ulnar Styloid Process  25.7 cm    Just Proximal to Ulnar Styloid Process  19.2 cm    Across Hand at [employer]  20.3 cm    At [place] of 2nd Digit  7 cm          Quick Dash - 05/09/19 0001    Open a tight or new jar  Moderate difficulty    Do heavy household chores (wash walls, wash floors)  No difficulty    Carry a shopping bag or briefcase  No difficulty    Wash your back  No difficulty    Use a knife to cut food  No difficulty    Recreational activities in which you take some force or impact through your arm, shoulder, or hand (golf, hammering, tennis)  No difficulty    During the past week, to what extent has your arm, shoulder or hand problem interfered with your normal social activities with family, friends, neighbors, or groups?  Not at all    During the past week, to what extent has your arm, shoulder or hand problem limited your work or other regular daily activities  Slightly    Arm, shoulder, or hand pain.  Mild    Tingling (pins and needles) in your arm, shoulder, or hand  Mild    Difficulty Sleeping  Mild difficulty    DASH Score  13.64 %  Objective measurements completed on examination: See above findings.      Patient was instructed today in a home exercise program today for post op shoulder range of motion. These included active assist shoulder flexion  in sitting, scapular retraction, wall walking with shoulder abduction, and hands behind head external rotation.  She was encouraged to do these twice a day, holding 3 seconds and repeating 5 times when permitted by her physician.            PT Education - 05/09/19 1700    Education Details  Lymphedema risk reduction and post op shoulder ROM HEP    Person(s) Educated  Patient    Methods  Explanation;Demonstration;Handout    Comprehension  Verbalized understanding;Returned demonstration          PT Long Term Goals - 05/09/19 1710      PT LONG TERM GOAL #1   Title  Patient will demonstrate she has regained full shoulder ROM and function post operatively compared to baselines.    Time  [facility] Goals - 05/09/19 1709      Patient will be able to verbalize understanding of pertinent lymphedema risk reduction practices relevant to her diagnosis specifically related to skin care.   Time  1    Period  Days    Status  Achieved      Patient will be able to return demonstrate and/or verbalize understanding of the post-op home exercise program related to regaining shoulder range of motion.   Time  1    Period  Days    Status  Achieved      Patient will be able to verbalize understanding of the importance of attending the postoperative After Breast Cancer Class for further lymphedema risk reduction education and therapeutic exercise.   Time  1    Period  Days    Status  Achieved            Plan - 05/09/19 1701    Clinical Impression Statement  Patient was diagnosed on 04/26/2019 with right grade III invasive ductal carcinoma breast cancer. It measures 1.7 cm and is located in the upper outer quadrant. It is ER/PR positive and HER2 negative with a Ki67 of 20%. She has a positive axillary lymph node.Her multidisciplinary medical team met prior to her assessments to determine a recommended treatment plan. She is planning to have a  right lumpectomy and targeted axillary node dissection followed by possible chemotherapy depending on Mammaprint results, radiation, and anti-estrogen therapy. She will benefit from a post op PT visit to determine needs.    Stability/Clinical Decision Making  Stable/Uncomplicated    Clinical Decision Making  Low    Rehab Potential  Excellent    PT Frequency  --   Eval and 1 f/u visit   PT Treatment/Interventions  ADLs/Self Care Home Management;Therapeutic exercise;Patient/family education    PT Next Visit Plan  Will reassess 3-4 weeks post op to determine needs    PT Home Exercise Plan  Post op shoulder ROM HEP    Consulted and Agree with Plan of Care  Patient       Patient will benefit from skilled therapeutic intervention in order to improve the following deficits and impairments:  Decreased knowledge of precautions, Pain, Impaired UE functional use, Postural dysfunction, Decreased range of motion  Visit Diagnosis: 1. Malignant neoplasm of upper-outer quadrant of right breast in female,  estrogen receptor positive ([place])   2. Abnormal posture      Patient will follow up at outpatient cancer rehab 3-4 weeks following surgery.  If the patient requires physical therapy at that time, a specific plan will be dictated and sent to the referring physician for approval. The patient was educated today on appropriate basic range of motion exercises to begin post operatively and the importance of attending the After Breast Cancer class following surgery.  Patient was educated today on lymphedema risk reduction practices as it pertains to recommendations that will benefit the patient immediately following surgery.  She verbalized good understanding.      Problem List Patient Active Problem List   Diagnosis Date Noted  . Family history of leukemia   . Family history of stomach cancer   . Malignant neoplasm of upper-outer quadrant of right breast in female, estrogen receptor positive ([place]) 05/07/2019   . S/P laparoscopic assisted vaginal hysterectomy ([facility]) 05/17/2011    Class: Chronic  . FOOT PAIN, RIGHT 07/11/2010   Annia Friendly, PT 05/09/19 5:12 PM  [place], [place], [address] Phone: [phone]   Fax:  [phone]  Name: Veronica [name] MRN: [id number] Date of Birth: Mar 01, 1971

## 2019-05-09 NOTE — Telephone Encounter (Signed)
Ordered mammaprint (core) per Dr. Jana Hakim.  Faxed requisition to [facility].

## 2019-05-09 NOTE — Addendum Note (Signed)
Addended by: Chauncey Cruel on: 05/09/2019 06:11 PM   Modules accepted: Orders

## 2019-05-09 NOTE — Progress Notes (Signed)
Radiation Oncology         [phone] ________________________________  Name: Veronica [name]        MRN: [id number]  Date of Service: 05/09/2019 DOB: Jun 01, 1971  JZ:PHXT, Clois Comber, PA-C     REFERRING PHYSICIAN: Blair Heys, [place]   DIAGNOSIS: The encounter diagnosis was Malignant neoplasm of upper-outer quadrant of right breast in female, estrogen receptor positive ([place]).   HISTORY OF PRESENT ILLNESS: Veronica [name] is a 48 y.o. female seen in the multidisciplinary breast clinic for a new diagnosis of right breast cancer. The patient was noted to have a screening detected abnormality in the rigth breast. On diagnostic imaging there was a mass at the 10:00 posiiton, measuring 1.7 x 1.5 x 1.5 cm, and her axilla was negative. Interestingly though in the axillary tail of the breast, there was a 6 x 5 x 5 mm lesion also at 10:00. She underwent a biopsy of the breast revealing a grade 3, invasive ductal carcinoma ER positive with moderate staining, PR positive, and HER2 was negative. Ki 67 was 20%. In the lesion in the axillary tail, the tissue was confirmed to be lymphoid and consistent with metastatic disease. She comes today to discuss options of treatment for her cancer.    PREVIOUS RADIATION THERAPY: No   PAST MEDICAL HISTORY:  Past Medical History:  Diagnosis Date   Chronic low back pain with left-sided sciatica    Hypertension        PAST SURGICAL HISTORY: Past Surgical History:  Procedure Laterality Date   BREAST BIOPSY Right 05/02/2019   right clips X2   C/S x2  '98 '03   CESAREAN SECTION     CHOLECYSTECTOMY     LAPAROSCOPIC ASSISTED VAGINAL HYSTERECTOMY  05/17/2011   Procedure: LAPAROSCOPIC ASSISTED VAGINAL HYSTERECTOMY;  Surgeon: Sharene Butters;  Location: [facility];  Service: Gynecology;  Laterality: N/A;  Laparoscopic Assisted Vaginal Hysterectomy With Lysis Of Adhesions   TUBAL LIGATION       FAMILY HISTORY:  Family History  Problem  Relation Age of Onset   Arthritis Mother    COPD Mother    Hypertension Mother    Hyperlipidemia Mother    Leukemia Brother      SOCIAL HISTORY:  reports that she has never smoked. She does not have any smokeless tobacco history on file. She reports current alcohol use. She reports that she does not use drugs. The patient is married and lives in [place]. 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   MEDICATIONS:  Current Outpatient Medications  Medication Sig Dispense Refill   doxycycline (DORYX) 100 MG DR capsule Take 100 mg by mouth 2 (two) times daily.     ibuprofen (ADVIL,MOTRIN) 200 MG tablet Take 600 mg by mouth every 6 (six) hours as needed. For pain     No current facility-administered medications for this visit.      REVIEW OF SYSTEMS: On review of systems, the patient reports that she is doing well overall. She denies any chest pain, shortness of breath, cough, fevers, chills, night sweats, unintended weight changes. She denies any bowel or bladder disturbances, and denies abdominal pain, nausea or vomiting. She denies any new musculoskeletal or joint aches or pains. A complete review of systems is obtained and is otherwise negative.     PHYSICAL EXAM:  Wt Readings from Last 3 Encounters:  03/18/12 188 lb (85.3 kg)  05/17/11 185 lb (83.9 kg)  05/10/11 185  lb (83.9 kg)   Temp Readings from Last 3 Encounters:  03/18/12 97.8 F (36.6 C) (Oral)  03/12/12 98.1 F (36.7 C) (Oral)  05/18/11 98.9 F (37.2 C) (Oral)   BP Readings from Last 3 Encounters:  03/18/12 157/87  03/12/12 147/83  05/18/11 138/87   Pulse Readings from Last 3 Encounters:  03/18/12 61  03/12/12 61  05/18/11 61    In general this is a well appearing caucasian female in no acute distress. She's alert and oriented x4 and appropriate throughout the examination. Cardiopulmonary assessment is negative for acute distress and she exhibits normal effort.  Breast exam is deferred.    ECOG = 0  0 - Asymptomatic (Fully active, able to carry on all predisease activities without restriction)  1 - Symptomatic but completely ambulatory (Restricted in physically strenuous activity but ambulatory and able to carry out work of a light or sedentary nature. For example, light housework, office work)  2 - Symptomatic, <50% in bed during the day (Ambulatory and capable of all self care but unable to carry out any work activities. Up and about more than 50% of waking hours)  3 - Symptomatic, >50% in bed, but not bedbound (Capable of only limited self-care, confined to bed or chair 50% or more of waking hours)  4 - Bedbound (Completely disabled. Cannot carry on any self-care. Totally confined to bed or chair)  5 - Death   Eustace Pen MM, Creech RH, Tormey DC, et al. [phone]). "Toxicity and response criteria of the [facility] Group". [place] Oncol. 5 (6): 649-55    LABORATORY DATA:  Lab Results  Component Value Date   WBC 9.6 05/09/2019   HGB 14.8 05/09/2019   HCT 43.0 05/09/2019   MCV 86.3 05/09/2019   PLT 210 05/09/2019   No results found for: NA, K, CL, CO2 No results found for: ALT, AST, GGT, ALKPHOS, BILITOT    RADIOGRAPHY: US Breast Ltd Uni Right Inc Axilla  Result Date: 05/01/2019 CLINICAL DATA:  The patient was called back for a new spiculated mass on the right. EXAM: DIGITAL DIAGNOSTIC RIGHT MAMMOGRAM WITH TOMO ULTRASOUND RIGHT BREAST COMPARISON:  Previous exam(s). ACR Breast Density Category c: The breast tissue is heterogeneously dense, which may obscure small masses. FINDINGS: The spiculated mass in the upper outer right breast persists on additional imaging. On physical exam, no suspicious lumps are identified. Targeted ultrasound is performed, showing an irregular mass in the right breast at 10 o'clock, 8 cm from the nipple correlating with the mammographic finding measuring 1.5 by 1.5 x 1.7 cm. There is a hypoechoic  mass in the right axillary tail/low axilla at 10 o'clock, 12 cm from the nipple measuring 6 x 5 by 5 mm, possibly a mildly abnormal lymph node. No other adenopathy seen in the right axilla. IMPRESSION: Highly suspicious irregular mass in the right breast at 10 o'clock, 8 cm from the nipple. Indeterminate hypoechoic round mass in the right axillary tail/low axilla. RECOMMENDATION: Recommend ultrasound-guided biopsy of the suspicious mass in the right breast at 10 o'clock, 8 cm from the nipple and the round hypoechoic mass in the right axillary tail/low axilla at 10 o'clock, 12 cm from the nipple. I have discussed the findings and recommendations with the patient. Results were also provided in writing at the conclusion of the visit. If applicable, a reminder letter will be sent to the patient regarding the next appointment. BI-RADS CATEGORY  5: Highly suggestive of malignancy. Electronically Signed   By: Shanon Brow  Mee Hives M.D   On: 05/01/2019 15:58   Mm Diag Breast Tomo Uni Right  Result Date: 05/01/2019 CLINICAL DATA:  The patient was called back for a new spiculated mass on the right. EXAM: DIGITAL DIAGNOSTIC RIGHT MAMMOGRAM WITH TOMO ULTRASOUND RIGHT BREAST COMPARISON:  Previous exam(s). ACR Breast Density Category c: The breast tissue is heterogeneously dense, which may obscure small masses. FINDINGS: The spiculated mass in the upper outer right breast persists on additional imaging. On physical exam, no suspicious lumps are identified. Targeted ultrasound is performed, showing an irregular mass in the right breast at 10 o'clock, 8 cm from the nipple correlating with the mammographic finding measuring 1.5 by 1.5 x 1.7 cm. There is a hypoechoic mass in the right axillary tail/low axilla at 10 o'clock, 12 cm from the nipple measuring 6 x 5 by 5 mm, possibly a mildly abnormal lymph node. No other adenopathy seen in the right axilla. IMPRESSION: Highly suspicious irregular mass in the right breast at 10 o'clock,  8 cm from the nipple. Indeterminate hypoechoic round mass in the right axillary tail/low axilla. RECOMMENDATION: Recommend ultrasound-guided biopsy of the suspicious mass in the right breast at 10 o'clock, 8 cm from the nipple and the round hypoechoic mass in the right axillary tail/low axilla at 10 o'clock, 12 cm from the nipple. I have discussed the findings and recommendations with the patient. Results were also provided in writing at the conclusion of the visit. If applicable, a reminder letter will be sent to the patient regarding the next appointment. BI-RADS CATEGORY  5: Highly suggestive of malignancy. Electronically Signed   By: Dorise Bullion III M.D   On: 05/01/2019 15:58   Mm Clip Placement Right  Result Date: 05/02/2019 CLINICAL DATA:  Status post ultrasound-guided core biopsy of 2 masses in the RIGHT breast. EXAM: DIAGNOSTIC LEFT MAMMOGRAM POST ULTRASOUND BIOPSY x2 COMPARISON:  Previous exam(s). FINDINGS: Mammographic images were obtained following ultrasound guided biopsy of mass in the 10 o'clock location of the RIGHT breast and placement of a ribbon shaped clip. The ribbon shaped clip is identified in the UPPER-OUTER QUADRANT of the RIGHT breast as expected. Following biopsy of mass in the 10 o'clock location 12 centimeters from the nipple and placement of a coil shaped clip, the clip is in expected location in the UPPER-OUTER QUADRANT RIGHT breast/LOWER RIGHT axilla. IMPRESSION: Tissue marker clips are in the expected locations after biopsy. Final Assessment: Post Procedure Mammograms for Marker Placement Electronically Signed   By: Nolon Nations M.D.   On: 05/02/2019 11:22   [place] Rt Breast Bx W Loc Dev 1st Lesion Img Bx Spec US Guide  Addendum Date: 05/03/2019   ADDENDUM REPORT: 05/03/2019 14:02 ADDENDUM: Pathology revealed GRADE III INVASIVE DUCTAL CARCINOMA, DUCTAL CARCINOMA IN SITU, LYMPHOVASCULAR INVASION IS IDENTIFIED of the RIGHT breast, 10 o'clock, 8cmfn, (ribbon clip). METASTATIC  CARCINOMA IN 1 OF 1 LYMPH NODE of the RIGHT breast, 10 o'clock, 12cmfn, (coil clip). This was found to be concordant by Dr. Nolon Nations. Pathology results were discussed with the patient by telephone. The patient reported doing well after the biopsies with tenderness at the sites. Post biopsy instructions and care were reviewed and questions were answered. The patient was encouraged to call [facility] for any additional concerns. The patient was referred to [facility] on May 09, 2019. Pathology results reported by Terie Purser, RN on 05/03/2019. Electronically Signed   By: Benjamine Mola  Owens Shark M.D.   On: 05/03/2019 14:02   Result Date: 05/03/2019 CLINICAL DATA:  The patient presents for ultrasound-guided core biopsy of 2 masses in the RIGHT breast. EXAM: ULTRASOUND GUIDED RIGHT BREAST CORE NEEDLE BIOPSY x2 COMPARISON:  Previous exam(s). FINDINGS: I met with the patient and we discussed the procedure of ultrasound-guided biopsy, including benefits and alternatives. We discussed the high likelihood of a successful procedure. We discussed the risks of the procedure, including infection, bleeding, tissue injury, clip migration, and inadequate sampling. Informed written consent was given. The usual time-out protocol was performed immediately prior to the procedure. Site 1, 10 o'clock location of the RIGHT breast 8 centimeters from the nipple. Ribbon clip. Lesion quadrant: UPPER-OUTER QUADRANT RIGHT breast Using sterile technique and 1% Lidocaine as local anesthetic, under direct ultrasound visualization, a 12 gauge spring-loaded device was used to perform biopsy of mass in the 10 o'clock location of the RIGHT breast 8 centimeters from nipple using inferior to superior approach. At the conclusion of the procedure a ribbon shaped tissue marker clip was deployed into the biopsy cavity. Site 2, 10 o'clock location of  the RIGHT breast 12 centimeters from nipple. Coil clip. The QUADRANT: UPPER-OUTER QUADRANT RIGHT breast Using sterile technique and 1% lidocaine as local anesthetic, under direct ultrasound visualization, a 12 gauge spring-loaded device was used to perform biopsy of mass in the 10 o'clock location of the RIGHT breast 12 centimeters from the nipple using an inferior to superior approach. Follow up 2 view mammogram was performed and dictated separately. IMPRESSION: Ultrasound guided biopsy of 2 sites in the RIGHT breast. No apparent complications. Electronically Signed: By: Nolon Nations M.D. On: 05/02/2019 11:21   [place] Rt Breast Bx W Loc Dev Ea Add Lesion Img Bx Spec US Guide  Addendum Date: 05/03/2019   ADDENDUM REPORT: 05/03/2019 14:02 ADDENDUM: Pathology revealed GRADE III INVASIVE DUCTAL CARCINOMA, DUCTAL CARCINOMA IN SITU, LYMPHOVASCULAR INVASION IS IDENTIFIED of the RIGHT breast, 10 o'clock, 8cmfn, (ribbon clip). METASTATIC CARCINOMA IN 1 OF 1 LYMPH NODE of the RIGHT breast, 10 o'clock, 12cmfn, (coil clip). This was found to be concordant by Dr. Nolon Nations. Pathology results were discussed with the patient by telephone. The patient reported doing well after the biopsies with tenderness at the sites. Post biopsy instructions and care were reviewed and questions were answered. The patient was encouraged to call [facility] for any additional concerns. The patient was referred to [facility] on May 09, 2019. Pathology results reported by Terie Purser, RN on 05/03/2019. Electronically Signed   By: Nolon Nations M.D.   On: 05/03/2019 14:02   Result Date: 05/03/2019 CLINICAL DATA:  The patient presents for ultrasound-guided core biopsy of 2 masses in the RIGHT breast. EXAM: ULTRASOUND GUIDED RIGHT BREAST CORE NEEDLE BIOPSY x2 COMPARISON:  Previous exam(s). FINDINGS: I met with the patient and we  discussed the procedure of ultrasound-guided biopsy, including benefits and alternatives. We discussed the high likelihood of a successful procedure. We discussed the risks of the procedure, including infection, bleeding, tissue injury, clip migration, and inadequate sampling. Informed written consent was given. The usual time-out protocol was performed immediately prior to the procedure. Site 1, 10 o'clock location of the RIGHT breast 8 centimeters from the nipple. Ribbon clip. Lesion quadrant: UPPER-OUTER QUADRANT RIGHT breast Using sterile technique and 1% Lidocaine as local anesthetic, under direct ultrasound visualization, a 12 gauge spring-loaded device was used to perform biopsy of  mass in the 10 o'clock location of the RIGHT breast 8 centimeters from nipple using inferior to superior approach. At the conclusion of the procedure a ribbon shaped tissue marker clip was deployed into the biopsy cavity. Site 2, 10 o'clock location of the RIGHT breast 12 centimeters from nipple. Coil clip. The QUADRANT: UPPER-OUTER QUADRANT RIGHT breast Using sterile technique and 1% lidocaine as local anesthetic, under direct ultrasound visualization, a 12 gauge spring-loaded device was used to perform biopsy of mass in the 10 o'clock location of the RIGHT breast 12 centimeters from the nipple using an inferior to superior approach. Follow up 2 view mammogram was performed and dictated separately. IMPRESSION: Ultrasound guided biopsy of 2 sites in the RIGHT breast. No apparent complications. Electronically Signed: By: Nolon Nations M.D. On: 05/02/2019 11:21       IMPRESSION/PLAN: 1. Stage IIA, [id number] grade 3, ER/PR positive invasive ductal carcinoma of the right breast. Dr. Lisbeth Renshaw discusses the pathology findings and reviews the nature of invasive right breast disease. The consensus from the breast conference includes proceeding with mammaprint testing on the biopsy specimen to determine a role for systemic chemotherapy,  followed by breast conservation with lumpectomy with targeted node dissection. She would benefit from external radiotherapy to the breast and regional nodes followed by antiestrogen therapy. We discussed the risks, benefits, short, and long term effects of radiotherapy, and the patient is interested in proceeding at the appropriate time. Dr. Lisbeth Renshaw discusses the delivery and logistics of radiotherapy and anticipates a course of 6 1/2 weeks of radiotherapy. We will see her back about 2 weeks after surgery to discuss the simulation process and anticipate we starting radiotherapy about 4-6 weeks after surgery.  2. Possible genetic predisposition to malignancy. The patient is a candidate for genetic testing given her personal and family history. She was offered referral and is interested in meeting with genetics and her blood was drawn today for testing panels.   In a visit lasting 45 minutes, greater than 50% of the time was spent face to face discussing her case, and coordinating the patient's care.  The above documentation reflects my direct findings during this shared patient visit. Please see the separate note by Dr. Lisbeth Renshaw on this date for the remainder of the patient's plan of care.    Carola Rhine, PAC

## 2019-05-09 NOTE — Patient Instructions (Signed)

## 2019-05-09 NOTE — Progress Notes (Signed)
REFERRING PROVIDER: Chauncey Cruel, MD [place],  [place] [address]  PRIMARY PROVIDER:  Blair Heys, PA-C  PRIMARY REASON FOR VISIT:  1. Malignant neoplasm of upper-outer quadrant of right breast in female, estrogen receptor positive ([place])   2. Family history of leukemia   3. Family history of stomach cancer     I connected with [name] on 05/09/2019 at 2:40 EDT by Webex video conference and verified that I am speaking with the correct person using two identifiers.   Patient location: clinic Provider location: clinic   HISTORY OF PRESENT ILLNESS:   Veronica [name], a 48 y.o. female, was seen for a [facility] cancer genetics consultation at the request of Dr. Jana Hakim due to a personal history of breast cancer and a family history of leukemia and stomach cancer.  [name] presents to clinic today to discuss the possibility of a hereditary predisposition to cancer, genetic testing, and to further clarify her future cancer risks, as well as potential cancer risks for family members.   In 2020, at the age of 76, [name] was diagnosed with IDC and DCIS, ER+/PR+/Her2-, of the right breast. The treatment plan includes surgery, radiation, antiestrogen therapy, and the potential for chemotherapy.   CANCER HISTORY:  Oncology History   No history exists.    RISK FACTORS:  Menarche was at age 63.  First live birth at age 59.  OCP use for approximately 1 years.  Ovaries intact: yes.  Hysterectomy: yes.  Menopausal status: postmenopausal.  HRT use: 0 years.   Past Medical History:  Diagnosis Date  . Chronic low back pain with left-sided sciatica   . Family history of leukemia   . Family history of stomach cancer   . Hypertension     Past Surgical History:  Procedure Laterality Date  . BREAST BIOPSY Right 05/02/2019   right clips X2  . C/S x2  '98 '03  . CESAREAN SECTION    . CHOLECYSTECTOMY    . LAPAROSCOPIC ASSISTED VAGINAL HYSTERECTOMY   05/17/2011   Procedure: LAPAROSCOPIC ASSISTED VAGINAL HYSTERECTOMY;  Surgeon: Sharene Butters;  Location: [facility];  Service: Gynecology;  Laterality: N/A;  Laparoscopic Assisted Vaginal Hysterectomy With Lysis Of Adhesions  . TUBAL LIGATION      Social History   Socioeconomic History  . Marital status: Married    Spouse name: Not on file  . Number of children: Not on file  . Years of education: Not on file  . Highest education level: Not on file  Occupational History  . Not on file  Social Needs  . Financial resource strain: Not on file  . Food insecurity    Worry: Not on file    Inability: Not on file  . Transportation needs    Medical: Not on file    Non-medical: Not on file  Tobacco Use  . Smoking status: Never Smoker  . Smokeless tobacco: Never Used  Substance and Sexual Activity  . Alcohol use: Yes    Alcohol/week: 2.0 standard drinks    Types: 2 Standard drinks or equivalent per week    Comment: "2-3 a week"  . Drug use: No  . Sexual activity: Yes    Birth control/protection: None  Lifestyle  . Physical activity    Days per week: Not on file    Minutes per session: Not on file  . Stress: Not on file  Relationships  . Social connections    Talks on phone: Not on file  Gets together: Not on file    Attends religious service: Not on file    Active member of club or organization: Not on file    Attends meetings of clubs or organizations: Not on file    Relationship status: Not on file  Other Topics Concern  . Not on file  Social History Narrative  . Not on file     FAMILY HISTORY:  We obtained a detailed, 4-generation family history.  Significant diagnoses are listed below: Family History  Problem Relation Age of Onset  . Arthritis Mother   . COPD Mother   . Hypertension Mother   . Hyperlipidemia Mother   . Leukemia Brother 32  . Stomach cancer Maternal Grandmother        late 42s    [name] has two daughters, [name] and Jarrett Soho. She has one  brother who died from leukemia when he was 69. She has five other brothers who are currently living and in their 76s, and a sister who is 89. There are no known diagnoses of cancer among nieces and nephews.  [name] mother is 45 years old with out a diagnosis of cancer. [name] does not have any biological maternal aunts and uncles, although she had one maternal uncle who was adopted into the family. [name] maternal grandmother had stomach cancer in her late 38s.  [name] father is 83 years old, but she does not have any additional information about her father or his family members.  [name] is unaware of previous family history of genetic testing for hereditary cancer risks. Patient's maternal ancestors are of unknown descent, and paternal ancestors are of Native American descent. There is no reported Ashkenazi Jewish ancestry. There is no known consanguinity.  GENETIC COUNSELING ASSESSMENT: [name] is a 48 y.o. female with a personal history of breast cancer and a family history of leukemia and stomach cancer, which is somewhat suggestive of a hereditary cancer syndrome and predisposition to cancer. We, therefore, discussed and recommended the following at today's visit.   DISCUSSION: We discussed that 5 - 10% of breast cancer is hereditary, with most cases associated with BRCA1/2.  There are other genes that can be associated with hereditary breast cancer syndromes.  These include ATM, CHEK2, PALB2, etc.  We discussed that testing is beneficial for several reasons including knowing about other cancer risks, identifying potential screening and risk-reduction options that may be appropriate, and to understand if other family members could be at risk for cancer and allow them to undergo genetic testing.  We reviewed the characteristics, features and inheritance patterns of hereditary cancer syndromes. We also discussed genetic testing, including the appropriate family  members to test, the process of testing, insurance coverage and turn-around-time for results. We discussed the implications of a negative, positive and/or variant of uncertain significant result. We recommended Ms. Wexler pursue genetic testing for the [facility] Common Hereditary Cancers gene panel.   The Common Hereditary Gene Panel offered by [facility] includes sequencing and/or deletion duplication testing of the following 48 genes: APC, ATM, AXIN2, BARD1, BMPR1A, BRCA1, BRCA2, BRIP1, CDH1, CDK4, CDKN2A (p14ARF), CDKN2A (p16INK4a), CHEK2, CTNNA1, DICER1, EPCAM (Deletion/duplication testing only), GREM1 (promoter region deletion/duplication testing only), KIT, MEN1, MLH1, MSH2, MSH3, MSH6, MUTYH, NBN, NF1, NHTL1, PALB2, PDGFRA, PMS2, POLD1, POLE, PTEN, RAD50, RAD51C, RAD51D, RNF43, SDHB, SDHC, SDHD, SMAD4, SMARCA4. STK11, TP53, TSC1, TSC2, and VHL.  The following genes were evaluated for sequence changes only: SDHA and HOXB13 c.251G>A variant only.   Although  Ms. Reber would like to have her genetic testing results for surgical decision-making, she is also concerned about the cost of the test and would prefer to opt for a longer turn-around-time because it would give her the option to opt-out of testing if the cost is too high.   Based on Ms. Akard's personal history of cancer, she meets medical criteria for genetic testing. Despite that she meets criteria, she may still have an out of pocket cost. We discussed that if her out of pocket cost for testing is over $100, the laboratory will call and confirm whether she wants to proceed with testing.  If the out of pocket cost of testing is less than $100 she will be billed by the genetic testing laboratory.   PLAN: After considering the risks, benefits, and limitations, Ms. Manon provided informed consent to pursue genetic testing and the blood sample was sent to [facility] for analysis of the Common Hereditary Cancers panel. Results should be  available within approximately two-three weeks' time, at which point they will be disclosed by telephone to Ms. Hennington, as will any additional recommendations warranted by these results. Ms. Joyner will receive a summary of her genetic counseling visit and a copy of her results once available. This information will also be available in Epic.   Ms. Mangen questions were answered to her satisfaction today. Our contact information was provided should additional questions or concerns arise. Thank you for the referral and allowing [place] to share in the care of your patient.   Clint Guy, MS Genetic Counselor [email or web address] Phone: [phone]   The patient was seen for a total of 15 minutes in face-to-face genetic counseling.  This patient was discussed with Dr. Jana Hakim who agrees with the above.    _______________________________________________________________________ For Office Staff:  Number of people involved in session: 1 Was an Intern/ student involved with case: no

## 2019-05-10 ENCOUNTER — Telehealth: Payer: Self-pay | Admitting: Oncology

## 2019-05-10 ENCOUNTER — Other Ambulatory Visit: Payer: Self-pay | Admitting: Surgery

## 2019-05-10 DIAGNOSIS — C50421 Malignant neoplasm of upper-outer quadrant of right male breast: Secondary | ICD-10-CM

## 2019-05-10 NOTE — Telephone Encounter (Signed)
I talk with patient regarding schedule  

## 2019-05-16 ENCOUNTER — Telehealth: Payer: Self-pay

## 2019-05-16 ENCOUNTER — Telehealth: Payer: Self-pay | Admitting: *Deleted

## 2019-05-16 NOTE — Telephone Encounter (Signed)
Nutrition Assessment  Reason for Assessment:  Pt attended Breast Clinic on 7/29 and was given nutrition packet by nurse navigator.   ASSESSMENT:  48 year old female with new diagnosis of right breast cancer.  Planning mammaprint from initial biopsy, breast conserving surgery with targeted axillary dissection, adjuvant radiation and antiestrogens.    Spoke with patient this am via phone to introduce self and service at [facility].  Patient talking morning walk during conversation.  Reports that she has been trying to be more active and eat better.    Medications:  reviewed  Labs: reviewed  Anthropometrics:   Height: 61 inches Weight: 188 lb  BMI: 35   NUTRITION DIAGNOSIS: Food and nutrition related knowledge deficit related to new diagnosis of breast cancer as evidenced by no prior need for nutrition related information.  INTERVENTION:   Discussed briefly packet of information regarding nutritional tips for breast cancer patients.  Patient does not have any questions at this time.  She has contact information and patient knows to contact me with questions/concerns.    MONITORING, EVALUATION, and GOAL: Pt will consume a healthy plant based diet to maintain lean body mass throughout treatment.   Brendalee Matthies B. Zenia Resides, [place], [place] Registered Dietitian [phone] (pager)

## 2019-05-16 NOTE — Telephone Encounter (Signed)
Received authorization for Mammaprint [id number]

## 2019-05-17 ENCOUNTER — Telehealth: Payer: Self-pay | Admitting: *Deleted

## 2019-05-17 ENCOUNTER — Telehealth: Payer: Self-pay | Admitting: Adult Health

## 2019-05-17 ENCOUNTER — Ambulatory Visit: Payer: Self-pay | Admitting: Genetic Counselor

## 2019-05-17 ENCOUNTER — Other Ambulatory Visit: Payer: Self-pay | Admitting: Oncology

## 2019-05-17 ENCOUNTER — Encounter: Payer: Self-pay | Admitting: Genetic Counselor

## 2019-05-17 ENCOUNTER — Telehealth: Payer: Self-pay | Admitting: Genetic Counselor

## 2019-05-17 DIAGNOSIS — Z1379 Encounter for other screening for genetic and chromosomal anomalies: Secondary | ICD-10-CM | POA: Insufficient documentation

## 2019-05-17 DIAGNOSIS — Z7189 Other specified counseling: Secondary | ICD-10-CM | POA: Insufficient documentation

## 2019-05-17 NOTE — Progress Notes (Addendum)
HPI:  Veronica [name] was previously seen in the [place] clinic due to a personal history of breast cancer and a family history of stomach cancer and leukemia and concerns regarding a hereditary predisposition to cancer. Please refer to our prior cancer genetics clinic note for more information regarding our discussion, assessment and recommendations, at the time. Veronica [name] recent genetic test results were disclosed to her, as were recommendations warranted by these results. These results and recommendations are discussed in more detail below.  CANCER HISTORY:  Oncology History  Malignant neoplasm of upper-outer quadrant of right breast in female, estrogen receptor positive ([place])  05/07/2019 Initial Diagnosis   Malignant neoplasm of upper-outer quadrant of right breast in female, estrogen receptor positive ([place])   05/16/2019 Genetic Testing   No pathogenic variants detected on the [facility] Common Hereditary Cancers panel. A variant of uncertain significance (VUS) was detected in one of her MSH6 genes (c.831A>C).  The Common Hereditary Gene Panel offered by [facility] includes sequencing and/or deletion duplication testing of the following 48 genes: APC, ATM, AXIN2, BARD1, BMPR1A, BRCA1, BRCA2, BRIP1, CDH1, CDK4, CDKN2A (p14ARF), CDKN2A (p16INK4a), CHEK2, CTNNA1, DICER1, EPCAM (Deletion/duplication testing only), GREM1 (promoter region deletion/duplication testing only), KIT, MEN1, MLH1, MSH2, MSH3, MSH6, MUTYH, NBN, NF1, NHTL1, PALB2, PDGFRA, PMS2, POLD1, POLE, PTEN, RAD50, RAD51C, RAD51D, RNF43, SDHB, SDHC, SDHD, SMAD4, SMARCA4. STK11, TP53, TSC1, TSC2, and VHL.  The following genes were evaluated for sequence changes only: SDHA and HOXB13 c.251G>A variant only.      FAMILY HISTORY:  We obtained a detailed, 4-generation family history.  Significant diagnoses are listed below: Family History  Problem Relation Age of Onset  . Arthritis Mother   . COPD Mother   . Hypertension Mother    . Hyperlipidemia Mother   . Leukemia Brother 10  . Stomach cancer Maternal Grandmother        late 66s      Veronica [name] has two daughters, [name] and Jarrett Soho. She has one brother who died from leukemia when he was 19. She has five other brothers who are currently living and in their 53s, and a sister who is 68. There are no known diagnoses of cancer among nieces and nephews.  Veronica [name] mother is 71 years old without a diagnosis of cancer. Veronica [name] does not have any biological maternal aunts and uncles, although she had one maternal uncle who was adopted into the family. Veronica [name] maternal grandmother had stomach cancer in her late 64s.  Veronica [name] father is 64 years old, but she does not have any additional information about her father or his family members.  Veronica [name] is unaware of previous family history of genetic testing for hereditary cancer risks. Patient's maternal ancestors are of unknown descent, and paternal ancestors are of Native American descent. There is no reported Ashkenazi Jewish ancestry. There is no known consanguinity.  GENETIC TEST RESULTS: Genetic testing reported out on 05/16/2019 through the [facility] Common Hereditary Cancers panel found no pathogenic mutations. The Common Hereditary Gene Panel offered by [facility] includes sequencing and/or deletion duplication testing of the following 48 genes: APC, ATM, AXIN2, BARD1, BMPR1A, BRCA1, BRCA2, BRIP1, CDH1, CDK4, CDKN2A (p14ARF), CDKN2A (p16INK4a), CHEK2, CTNNA1, DICER1, EPCAM (Deletion/duplication testing only), GREM1 (promoter region deletion/duplication testing only), KIT, MEN1, MLH1, MSH2, MSH3, MSH6, MUTYH, NBN, NF1, NHTL1, PALB2, PDGFRA, PMS2, POLD1, POLE, PTEN, RAD50, RAD51C, RAD51D, RNF43, SDHB, SDHC, SDHD, SMAD4, SMARCA4. STK11, TP53, TSC1, TSC2, and VHL.  The following genes were evaluated for sequence changes  only: SDHA and HOXB13 c.251G>A variant only. The test report will be scanned into EPIC  and is located under the Molecular Pathology section of the Results Review tab.  A portion of the result report is included below for reference.     We discussed with Veronica [name] that because current genetic testing is not perfect, it is possible there may be a gene mutation in one of these genes that current testing cannot detect, but that chance is small.  We also discussed, that there could be another gene that has not yet been discovered, or that we have not yet tested, that is responsible for the cancer diagnoses in the family. It is also possible there is a hereditary cause for the cancer in the family that Veronica [name] did not inherit and therefore was not identified in her testing.  Therefore, it is important to remain in touch with cancer genetics in the future so that we can continue to offer Veronica [name] the most up to date genetic testing.   Genetic testing did identify a variant of uncertain significance (VUS) was identified in the MSH6 gene called c.831A>C (p.Glu277Asp).  At this time, it is unknown if this variant is associated with increased cancer risk or if this is a normal finding, but most variants such as this get reclassified to being inconsequential. It should not be used to make medical management decisions. With time, we suspect the lab will determine the significance of this variant, if any. If we do learn more about it, we will try to contact Veronica [name] to discuss it further. However, it is important to stay in touch with [place] periodically and keep the address and phone number up to date.  UPDATE:  The MSH6 c.831A>C VUS was reclassified to "Benign" on 11/30/2020 as a result of re-review of the evidence in light of new variant interpretation guidelines and/or new information.   ADDITIONAL GENETIC TESTING: We discussed with Veronica [name] that her genetic testing was fairly extensive.  If there are genes identified to increase cancer risk that can be analyzed in the future, we would  be happy to discuss and coordinate this testing at that time.    CANCER SCREENING RECOMMENDATIONS: Veronica [name] test result is considered negative (normal).  This means that we have not identified a hereditary cause for her personal and family history of cancer at this time. Most cancers happen by chance and this negative test suggests that her cancer may fall into this category.   While reassuring, this does not definitively rule out a hereditary predisposition to cancer. It is still possible that there could be genetic mutations that are undetectable by current technology. There could be genetic mutations in genes that have not been tested or identified to increase cancer risk.  Therefore, it is recommended she continue to follow the cancer management and screening guidelines provided by her oncology and primary healthcare provider.   An individual's cancer risk and medical management are not determined by genetic test results alone. Overall cancer risk assessment incorporates additional factors, including personal medical history, family history, and any available genetic information that may result in a personalized plan for cancer prevention and surveillance  RECOMMENDATIONS FOR FAMILY MEMBERS:  Individuals in this family might be at some increased risk of developing cancer, over the general population risk, simply due to the family history of cancer.  We recommended women in this family have a yearly mammogram beginning at age 71, or 43 years younger than the earliest  onset of cancer, an annual clinical breast exam, and perform monthly breast self-exams. Women in this family should also have a gynecological exam as recommended by their primary provider. All family members should have a colonoscopy by age 34.  FOLLOW-UP: Lastly, we discussed with Veronica [name] that cancer genetics is a rapidly advancing field and it is possible that new genetic tests will be appropriate for her and/or her family  members in the future. We encouraged her to remain in contact with cancer genetics on an annual basis so we can update her personal and family histories and let her know of advances in cancer genetics that may benefit this family.   Our contact number was provided. Veronica [name] questions were answered to her satisfaction, and she knows she is welcome to call us at anytime with additional questions or concerns.   Clint Guy, MS Genetic Counselor [email or web address] Phone: [phone]

## 2019-05-17 NOTE — Progress Notes (Signed)
I gave Veronica [name] results of her Mammaprint. She willneed chemo and a port. She is agreeable  GM

## 2019-05-17 NOTE — Telephone Encounter (Signed)
Calling with genetic testing results. Veronica [name] is on a walk and will be available to talk in about 30 minutes. I will call her back later.

## 2019-05-17 NOTE — Telephone Encounter (Signed)
Spoke to pt concerning [place] from 7.29.20. Denies questions or concerns regarding dx or treatment care plan. Encourage pt to call with needs. Informed pt we will call with Mammaprint testing results when they are released. Received verbal understanding.

## 2019-05-17 NOTE — Telephone Encounter (Signed)
Received Mammaprint result of HIGH RISK. Physician team notified. 

## 2019-05-17 NOTE — Telephone Encounter (Signed)
Revealed negative genetic testing.  Discussed that we do not know why she has breast cancer. It could be due to a different gene that we are not testing, or maybe our current technology may not be able to pick something up.  It will be important for her to keep in contact with genetics to keep up with whether additional testing may be needed.  Testing also showed a variant of uncertain significance in one of her MSH6 genes.  Her results are still considered normal, and this variant should not impact her medical management.  

## 2019-05-17 NOTE — Telephone Encounter (Signed)
I talk with patient regarding schedule  

## 2019-05-18 ENCOUNTER — Encounter (HOSPITAL_COMMUNITY): Payer: Self-pay | Admitting: Oncology

## 2019-05-18 ENCOUNTER — Telehealth: Payer: Self-pay | Admitting: Oncology

## 2019-05-18 NOTE — Telephone Encounter (Signed)
Scheduled apt per 8/06 sch message - pt aware of appt date and time and aware of appt on 9/22 being cancelled

## 2019-05-21 ENCOUNTER — Encounter: Payer: Self-pay | Admitting: General Practice

## 2019-05-21 NOTE — Progress Notes (Signed)
[place] Psychosocial Distress Screening Spiritual Care  Met with Veronica [name] by phone following Breast Multidisciplinary Clinic to introduce [place] team/resources, reviewing distress screen per protocol.  The patient scored a 3 on the Psychosocial Distress Thermometer which indicates mild distress. Also assessed for distress and other psychosocial needs.   ONCBCN DISTRESS SCREENING 05/21/2019  Screening Type Initial Screening  Distress experienced in past week (1-10) 3  Emotional problem type Nervousness/Anxiety;Adjusting to illness  Physical Problem type Pain;Tingling hands/feet  Referral to support programs Yes   Veronica [name] reports great support from family, which is really helping manage her stress level. Per pt, no needs or questions at this time, but is glad to be aware of [place] team/programming availability as needs arise.  Follow up needed: No. Please page if circumstances change! Thank you.   [place], [place], [facility] Pager [phone] Voicemail [phone]

## 2019-06-05 ENCOUNTER — Other Ambulatory Visit: Payer: Self-pay

## 2019-06-05 ENCOUNTER — Encounter (HOSPITAL_BASED_OUTPATIENT_CLINIC_OR_DEPARTMENT_OTHER): Payer: Self-pay | Admitting: *Deleted

## 2019-06-08 ENCOUNTER — Other Ambulatory Visit (HOSPITAL_COMMUNITY)
Admission: RE | Admit: 2019-06-08 | Discharge: 2019-06-08 | Disposition: A | Discharge status: Home / Self Care | Payer: 59 | Source: Ambulatory Visit | Attending: Surgery | Admitting: Surgery

## 2019-06-08 ENCOUNTER — Other Ambulatory Visit: Payer: Self-pay

## 2019-06-08 ENCOUNTER — Encounter (HOSPITAL_BASED_OUTPATIENT_CLINIC_OR_DEPARTMENT_OTHER)
Admission: RE | Admit: 2019-06-08 | Discharge: 2019-06-08 | Disposition: A | Discharge status: Home / Self Care | Payer: 59 | Source: Ambulatory Visit | Attending: Surgery | Admitting: Surgery

## 2019-06-08 DIAGNOSIS — Z01812 Encounter for preprocedural laboratory examination: Secondary | ICD-10-CM | POA: Diagnosis present

## 2019-06-08 DIAGNOSIS — C50411 Malignant neoplasm of upper-outer quadrant of right female breast: Secondary | ICD-10-CM | POA: Insufficient documentation

## 2019-06-08 DIAGNOSIS — Z20828 Contact with and (suspected) exposure to other viral communicable diseases: Secondary | ICD-10-CM | POA: Diagnosis not present

## 2019-06-08 DIAGNOSIS — Z170 Estrogen receptor positive status [ER+]: Secondary | ICD-10-CM | POA: Insufficient documentation

## 2019-06-08 LAB — COMPREHENSIVE METABOLIC PANEL
ALT: 39 U/L (ref 0–44)
AST: 25 U/L (ref 15–41)
Albumin: 3.7 g/dL (ref 3.5–5.0)
Alkaline Phosphatase: 48 U/L (ref 38–126)
Anion gap: 9 (ref 5–15)
BUN: 8 mg/dL (ref 6–20)
CO2: 21 mmol/L — ABNORMAL LOW (ref 22–32)
Calcium: 9.2 mg/dL (ref 8.9–10.3)
Chloride: 107 mmol/L (ref 98–111)
Creatinine, Ser: 0.82 mg/dL (ref 0.44–1.00)
GFR calc Af Amer: >60 mL/min (ref >60)
GFR calc non Af Amer: >60 mL/min (ref >60)
Glucose, Bld: 106 mg/dL — ABNORMAL HIGH (ref 70–99)
Potassium: 4.2 mmol/L (ref 3.5–5.1)
Sodium: 137 mmol/L (ref 135–145)
Total Bilirubin: 1 mg/dL (ref 0.3–1.2)
Total Protein: 6.7 g/dL (ref 6.5–8.1)

## 2019-06-08 LAB — CBC WITH DIFFERENTIAL/PLATELET
Abs Immature Granulocytes: 0.04 10*3/uL (ref 0.00–0.07)
Basophils Absolute: 0 10*3/uL (ref 0.0–0.1)
Basophils Relative: 0 %
Eosinophils Absolute: 0.1 10*3/uL (ref 0.0–0.5)
Eosinophils Relative: 2 %
HCT: 42.5 % (ref 36.0–46.0)
Hemoglobin: 14.2 g/dL (ref 12.0–15.0)
Immature Granulocytes: 1 %
Lymphocytes Relative: 26 %
Lymphs Abs: 2 10*3/uL (ref 0.7–4.0)
MCH: 29.5 pg (ref 26.0–34.0)
MCHC: 33.4 g/dL (ref 30.0–36.0)
MCV: 88.2 fL (ref 80.0–100.0)
Monocytes Absolute: 0.6 10*3/uL (ref 0.1–1.0)
Monocytes Relative: 8 %
Neutro Abs: 4.9 10*3/uL (ref 1.7–7.7)
Neutrophils Relative %: 63 %
Platelets: 227 10*3/uL (ref 150–400)
RBC: 4.82 MIL/uL (ref 3.87–5.11)
RDW: 11.9 % (ref 11.5–15.5)
WBC: 7.7 10*3/uL (ref 4.0–10.5)
nRBC: 0 % (ref 0.0–0.2)

## 2019-06-08 LAB — SARS CORONAVIRUS 2 (TAT 6-24 HRS): SARS Coronavirus 2: NEGATIVE

## 2019-06-08 NOTE — Progress Notes (Signed)

## 2019-06-11 ENCOUNTER — Ambulatory Visit
Admission: RE | Admit: 2019-06-11 | Discharge: 2019-06-11 | Disposition: A | Discharge status: Home / Self Care | Payer: 59 | Source: Ambulatory Visit | Attending: Surgery | Admitting: Surgery

## 2019-06-11 ENCOUNTER — Other Ambulatory Visit: Payer: Self-pay

## 2019-06-11 ENCOUNTER — Other Ambulatory Visit: Payer: Self-pay | Admitting: Surgery

## 2019-06-11 DIAGNOSIS — C50421 Malignant neoplasm of upper-outer quadrant of right male breast: Secondary | ICD-10-CM

## 2019-06-11 DIAGNOSIS — Z170 Estrogen receptor positive status [ER+]: Secondary | ICD-10-CM

## 2019-06-11 IMAGING — US US PLC BREAST LOCDEV EA ADD LESION INC US GUIDE*R*
1 series · 5 of 5 positions shown · non-contrast
Comparison: Previous exam(s).

CLINICAL DATA: Recently diagnosed invasive ductal carcinoma and
ductal carcinoma in situ in the 10 o'clock position of the right
breast, 8 cm from the nipple and metastatic lymph node in the 10
o'clock position of the right breast, 12 cm from the nipple.

EXAM:
MAMMOGRAPHIC GUIDED RADIOACTIVE SEED LOCALIZATION OF THE RIGHT
BREAST
ULTRASOUND GUIDED RADIOACTIVE SEED LOCALIZATION OF THE RIGHT BREAST

[Series 1: us plc breast locdev ea add lesion inc us guide*ri · 0.06mm/px · 5 of 5 slices shown]
[im 1/5]
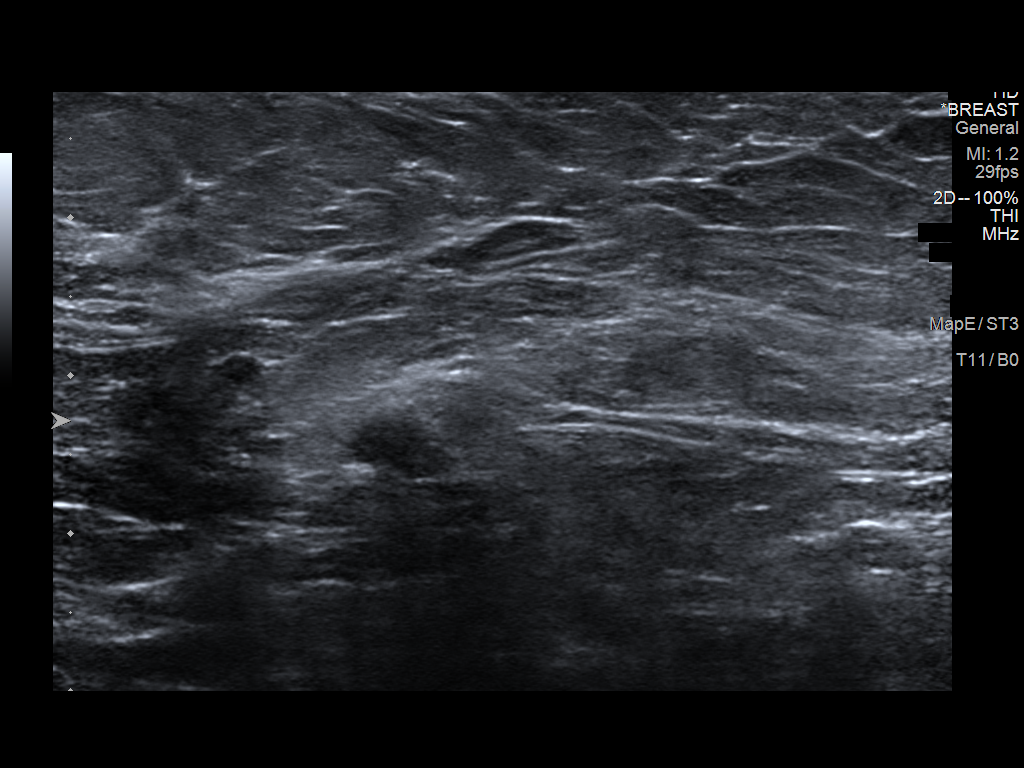
[im 2/5]
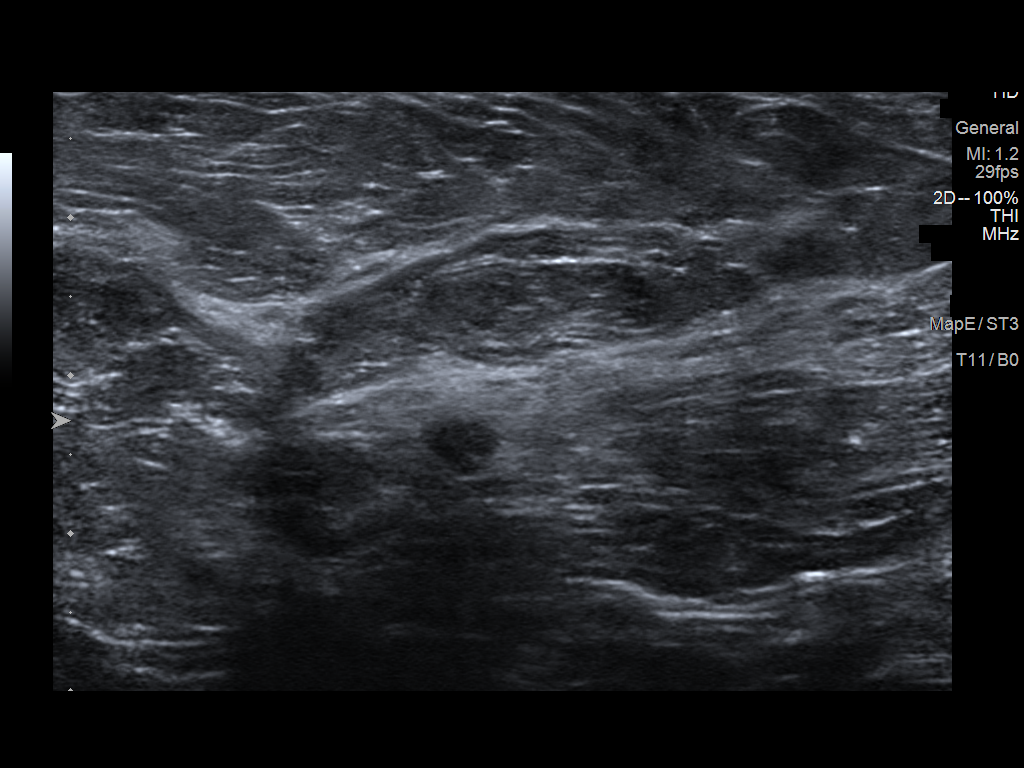
[im 3/5]
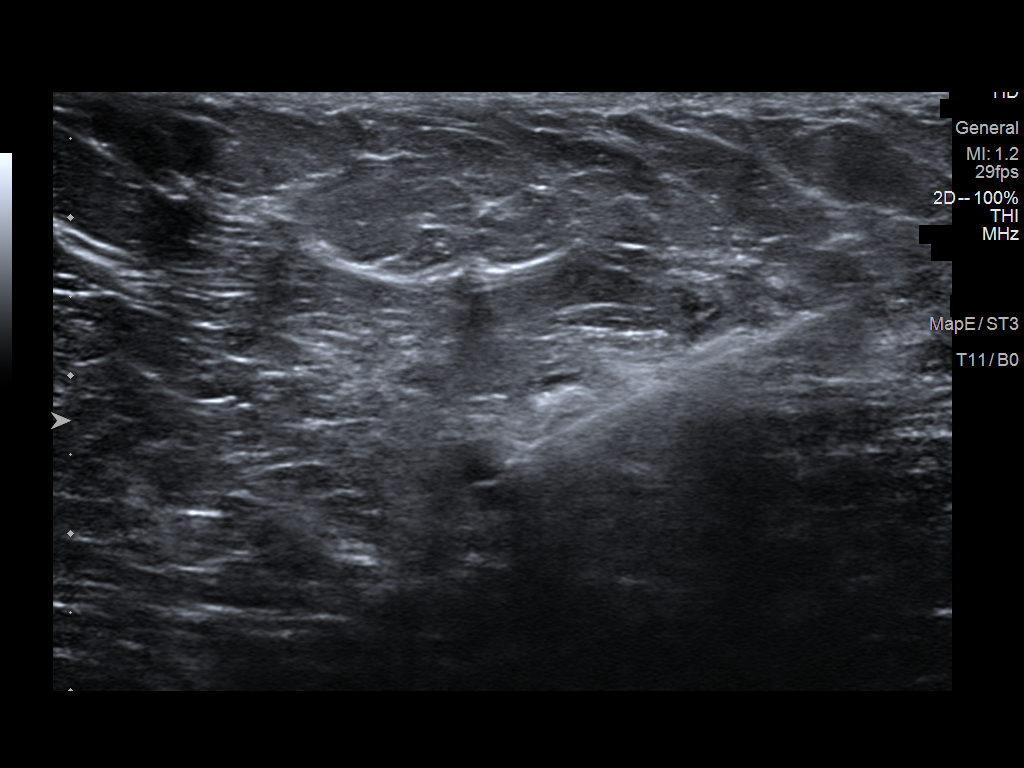
[im 4/5]
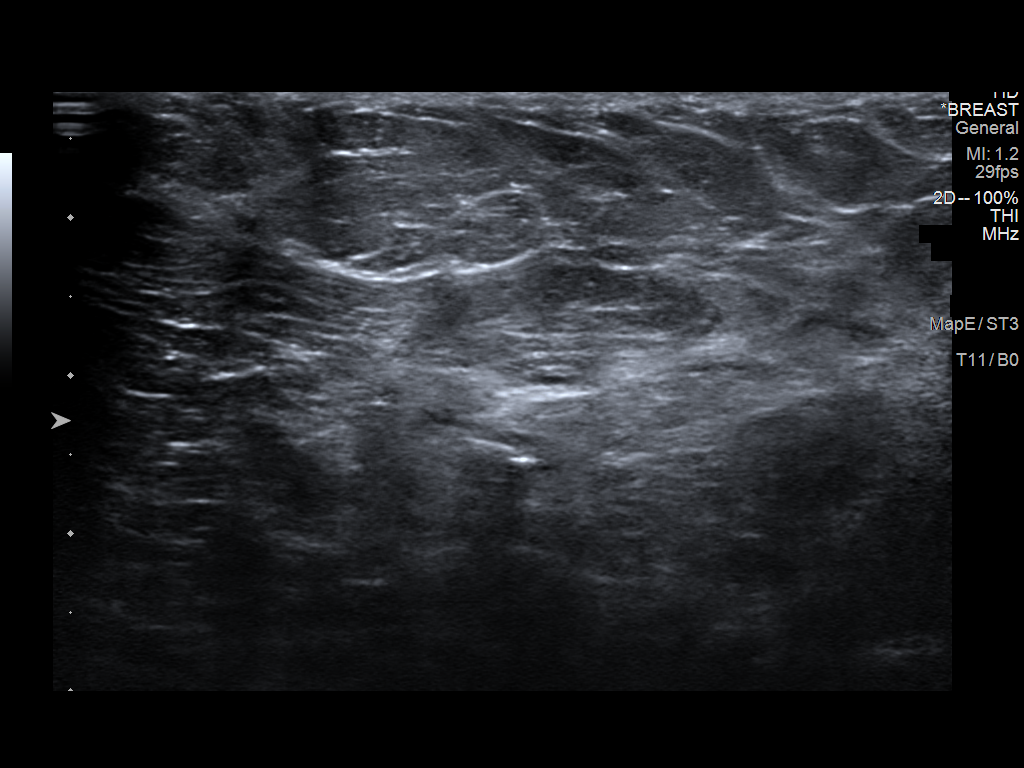
[im 5/5]
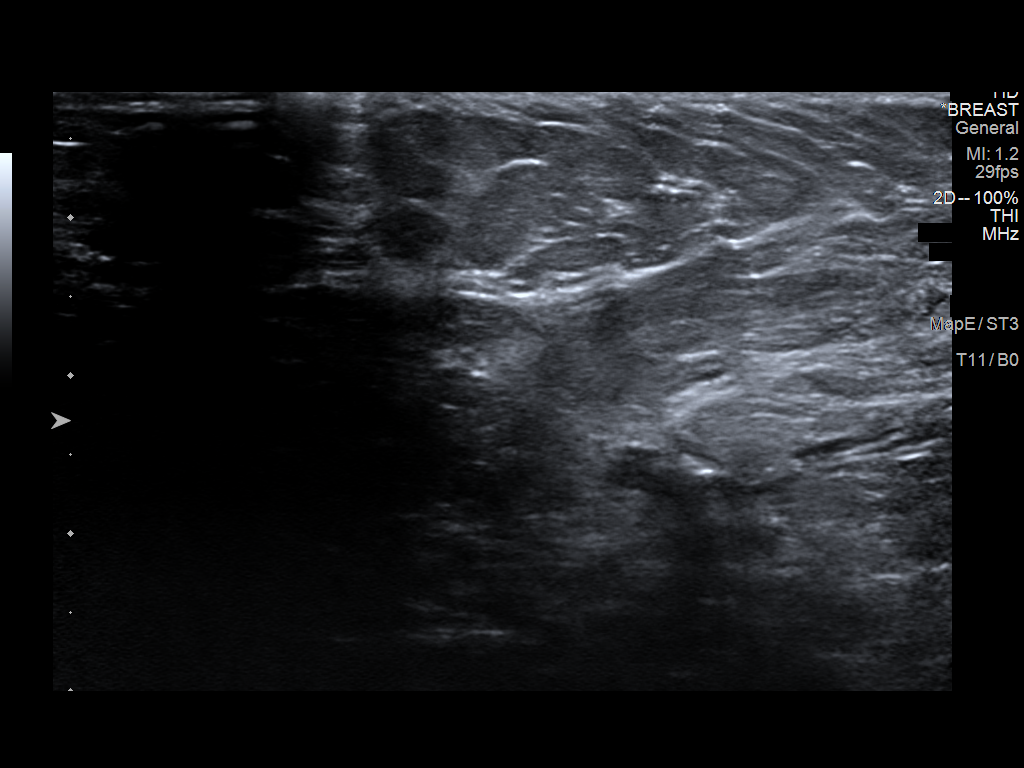

[5 of 5 positions shown; findings below may reference images not displayed]

FINDINGS: Patient presents for radioactive seed localization prior to right
lumpectomy. I met with the patient and we discussed the procedure of
seed localization including benefits and alternatives. We discussed
the high likelihood of a successful procedure. We discussed the
risks of the procedure including infection, bleeding, tissue injury
and further surgery. We discussed the low dose of radioactivity
involved in the procedure. Informed, written consent was given.

The usual time-out protocol was performed immediately prior to the
procedures.

SITE 1: RIBBON SHAPED BIOPSY MARKER CLIP IN INVASIVE DUCTAL
CARCINOMA AND DUCTAL CARCINOMA IN SITU IN THE 10 O'CLOCK POSITION OF
THE RIGHT BREAST, 8 CM FROM THE NIPPLE

Using mammographic guidance, sterile technique, 1% lidocaine and an
[JK] radioactive seed, the ribbon shaped biopsy marker clip in a
spiculated mass in the 10 o'clock position of the right breast was
localized using a cephalad approach. The follow-up mammogram images
confirm the seed in the expected location and were marked for Dr.
BRIGIDA.

Follow-up survey of the patient confirms presence of the radioactive
seed.

Order number of [JK] seed:  [PHONE_NUMBER].

Total activity:  0.253 mCi reference Date: [DATE]

SITE 2: COIL SHAPED BIOPSY MARKER CLIP IN THE METASTATIC LYMPH NODE
IN 10 O'CLOCK POSITION OF THE RIGHT BREAST, 12 CM FROM THE NIPPLE

Using ultrasound guidance, sterile technique, 1% lidocaine and an
[JK] radioactive seed, the biopsied mass containing a coil shaped
biopsy marker clip in the 10 o'clock position of the right breast,
12 cm from the nipple, was localized using a caudal approach. The
follow-up mammogram images confirm the seed in the expected location
and were marked for Dr. BRIGIDA.

Follow-up survey of the patient confirms presence of the radioactive
seed.

Order number of [JK] seed:  [PHONE_NUMBER].

Total activity:  0.253 mCi reference Date: [DATE]

The patient tolerated the procedure well and was released from the
[REDACTED]. She was given instructions regarding seed removal.
IMPRESSION: Radioactive seed localization right breast x 2. No apparent
complications.

## 2019-06-11 IMAGING — MG MM PLC BREAST LOC DEV 1ST LESION INC*R*
8 of 10 series · 8 of 18 positions shown · non-contrast
Comparison: Previous exam(s).

CLINICAL DATA: Recently diagnosed invasive ductal carcinoma and
ductal carcinoma in situ in the 10 o'clock position of the right
breast, 8 cm from the nipple and metastatic lymph node in the 10
o'clock position of the right breast, 12 cm from the nipple.

EXAM:
MAMMOGRAPHIC GUIDED RADIOACTIVE SEED LOCALIZATION OF THE RIGHT
BREAST
ULTRASOUND GUIDED RADIOACTIVE SEED LOCALIZATION OF THE RIGHT BREAST

[R ML (1 of 3)]
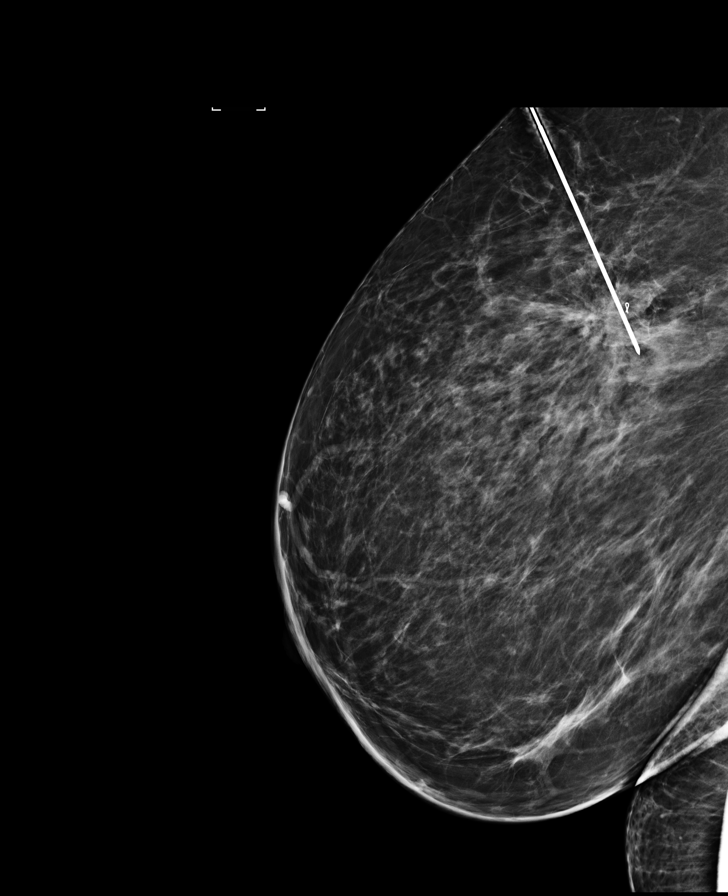

[R ML (2 of 3)]
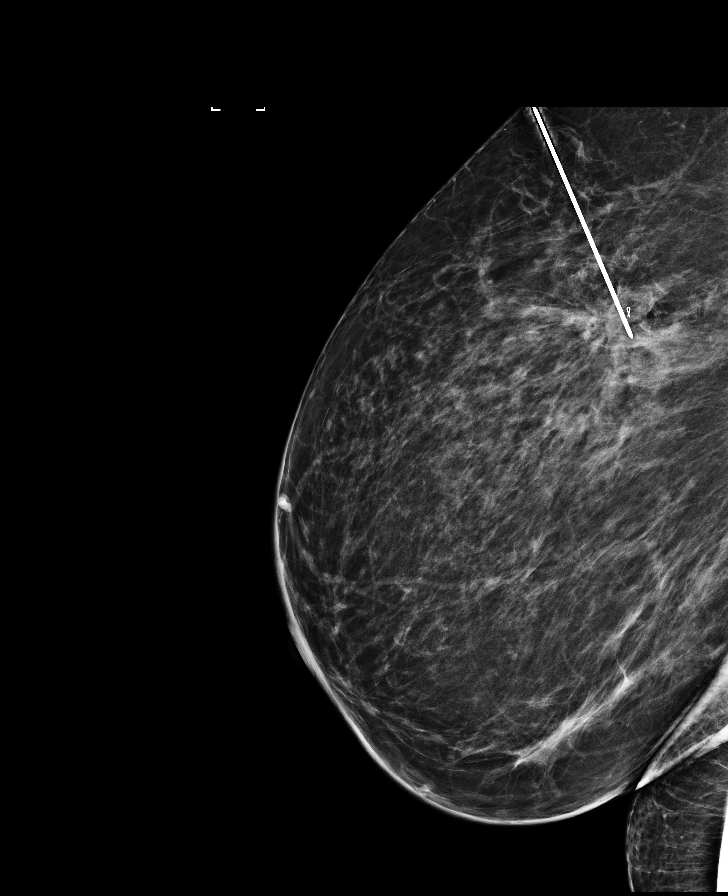

[R CC (1 of 3)]
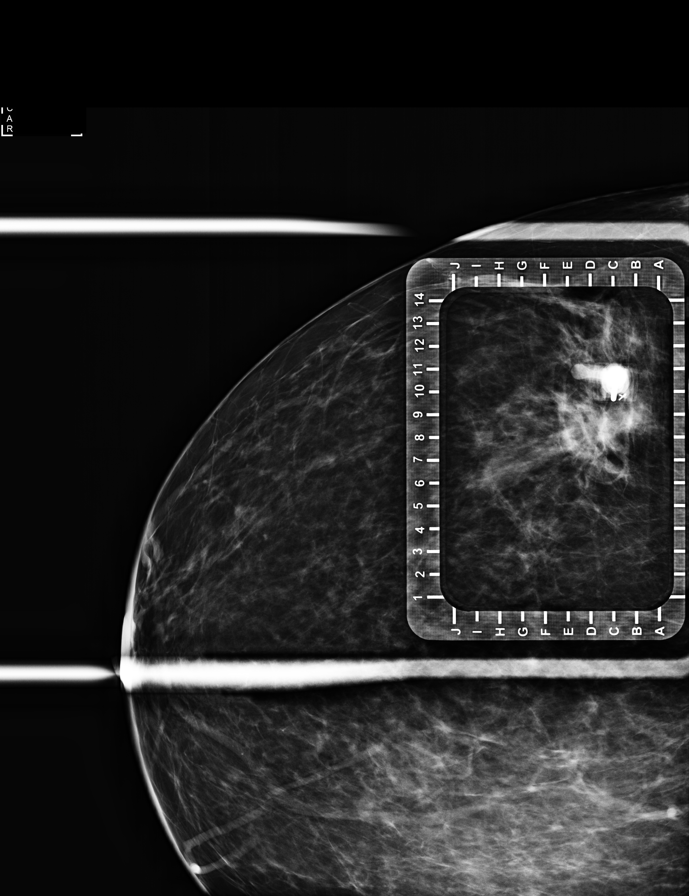

[R CC (2 of 3)]
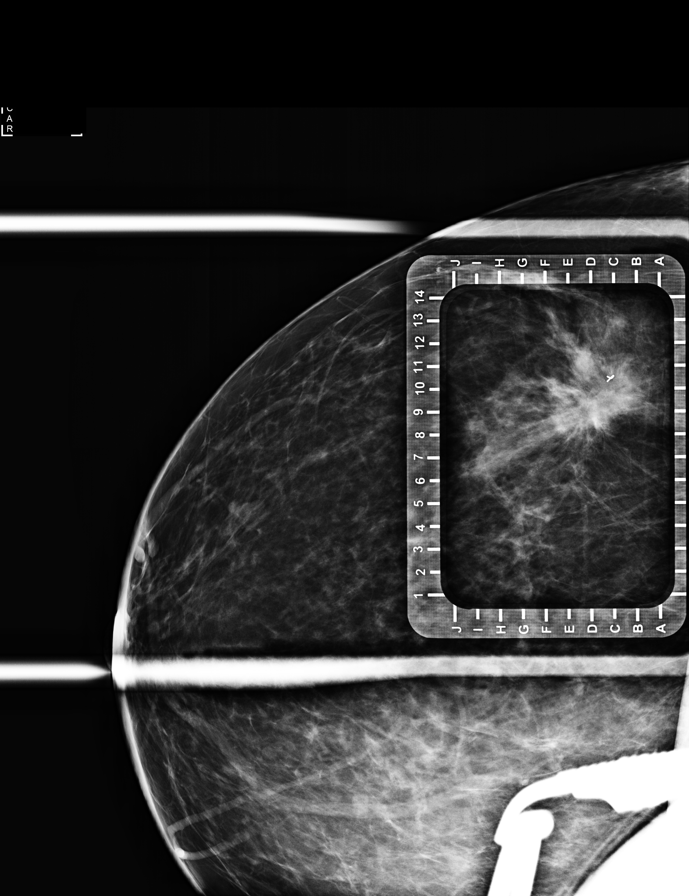

[R ML (3 of 3)]
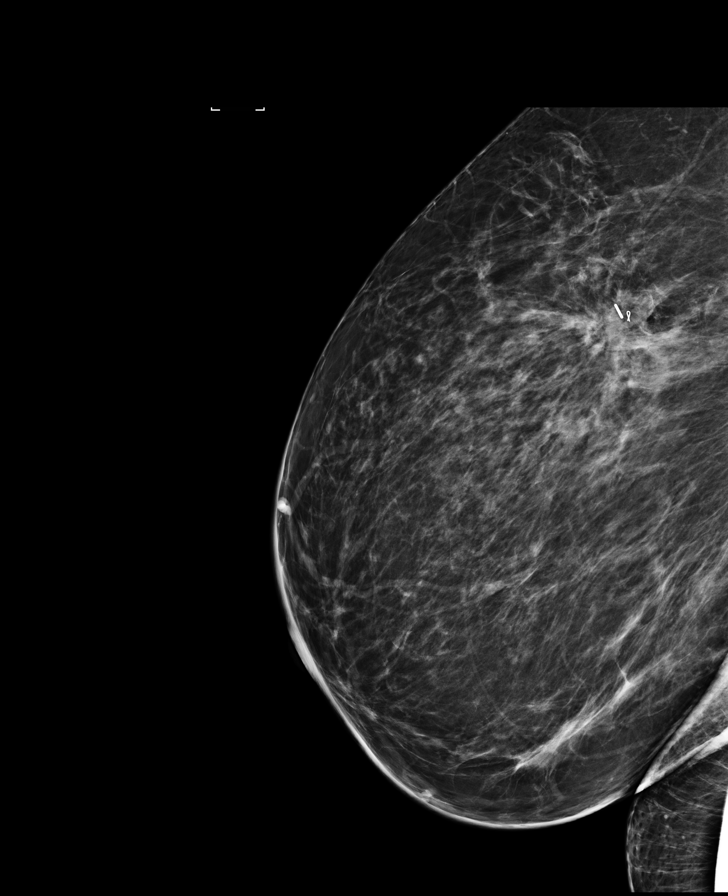

[R CC (3 of 3)]
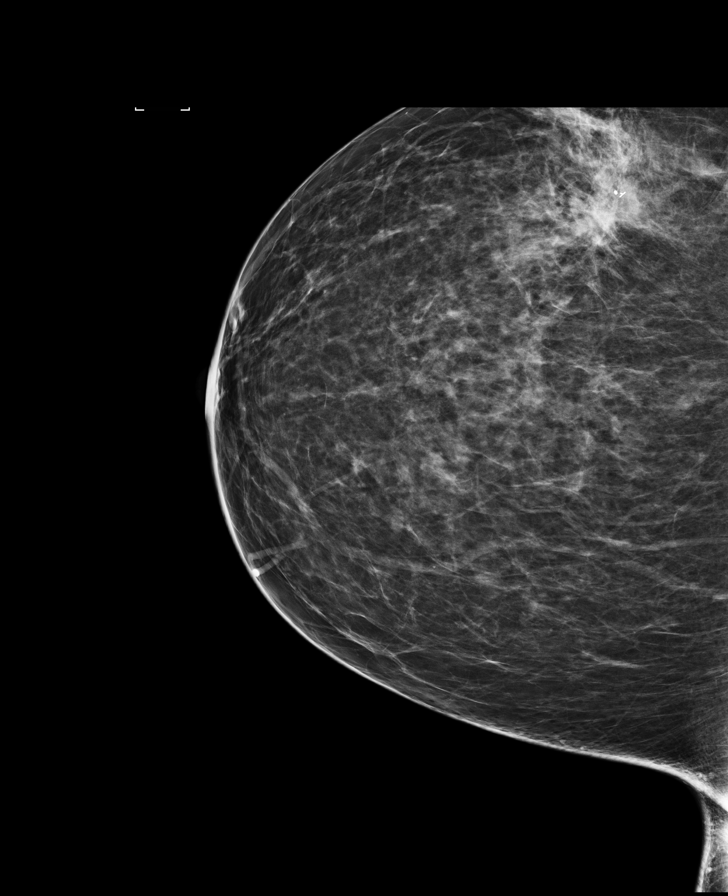

[R XCCL synth-2D]
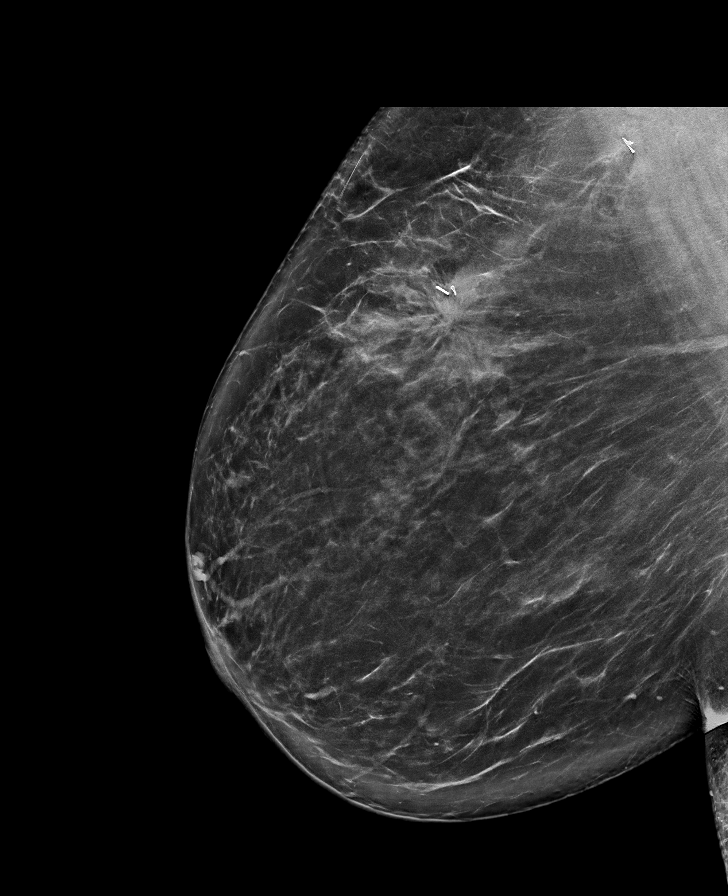

[R LM synth-2D]
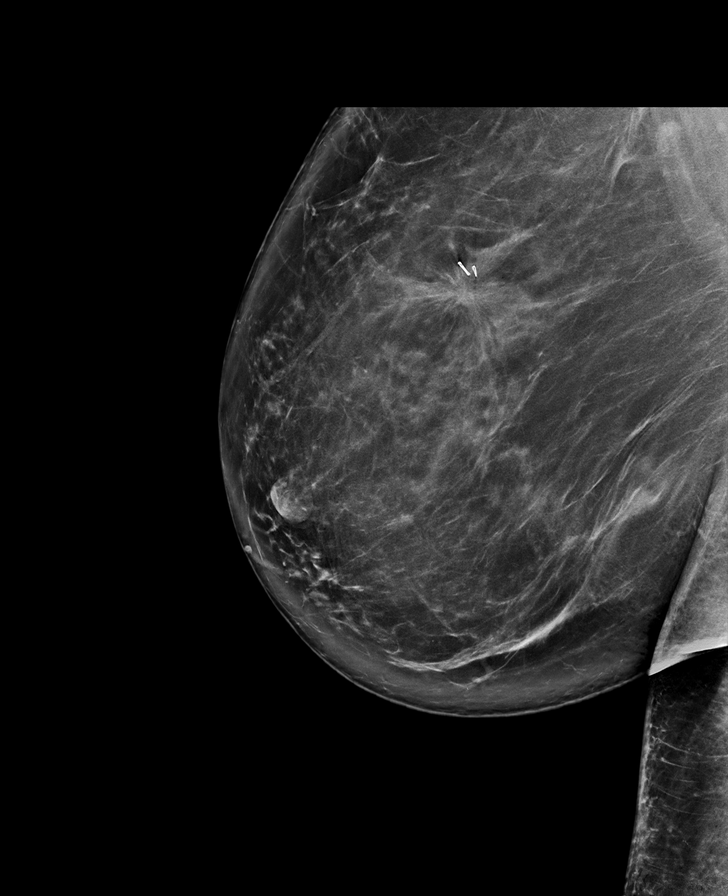

[8 of 18 positions shown; findings below may reference images not displayed]

FINDINGS: Patient presents for radioactive seed localization prior to right
lumpectomy. I met with the patient and we discussed the procedure of
seed localization including benefits and alternatives. We discussed
the high likelihood of a successful procedure. We discussed the
risks of the procedure including infection, bleeding, tissue injury
and further surgery. We discussed the low dose of radioactivity
involved in the procedure. Informed, written consent was given.

The usual time-out protocol was performed immediately prior to the
procedures.

SITE 1: RIBBON SHAPED BIOPSY MARKER CLIP IN INVASIVE DUCTAL
CARCINOMA AND DUCTAL CARCINOMA IN SITU IN THE 10 O'CLOCK POSITION OF
THE RIGHT BREAST, 8 CM FROM THE NIPPLE

Using mammographic guidance, sterile technique, 1% lidocaine and an
[JK] radioactive seed, the ribbon shaped biopsy marker clip in a
spiculated mass in the 10 o'clock position of the right breast was
localized using a cephalad approach. The follow-up mammogram images
confirm the seed in the expected location and were marked for Dr.
BRIGIDA.

Follow-up survey of the patient confirms presence of the radioactive
seed.

Order number of [JK] seed:  [PHONE_NUMBER].

Total activity:  0.253 mCi reference Date: [DATE]

SITE 2: COIL SHAPED BIOPSY MARKER CLIP IN THE METASTATIC LYMPH NODE
IN 10 O'CLOCK POSITION OF THE RIGHT BREAST, 12 CM FROM THE NIPPLE

Using ultrasound guidance, sterile technique, 1% lidocaine and an
[JK] radioactive seed, the biopsied mass containing a coil shaped
biopsy marker clip in the 10 o'clock position of the right breast,
12 cm from the nipple, was localized using a caudal approach. The
follow-up mammogram images confirm the seed in the expected location
and were marked for Dr. BRIGIDA.

Follow-up survey of the patient confirms presence of the radioactive
seed.

Order number of [JK] seed:  [PHONE_NUMBER].

Total activity:  0.253 mCi reference Date: [DATE]

The patient tolerated the procedure well and was released from the
[REDACTED]. She was given instructions regarding seed removal.
IMPRESSION: Radioactive seed localization right breast x 2. No apparent
complications.

## 2019-06-12 ENCOUNTER — Ambulatory Visit (HOSPITAL_BASED_OUTPATIENT_CLINIC_OR_DEPARTMENT_OTHER): Payer: 59 | Admitting: Certified Registered"

## 2019-06-12 ENCOUNTER — Ambulatory Visit
Admission: RE | Admit: 2019-06-12 | Discharge: 2019-06-12 | Disposition: A | Discharge status: Home / Self Care | Payer: 59 | Source: Ambulatory Visit | Attending: Surgery | Admitting: Surgery

## 2019-06-12 ENCOUNTER — Encounter (HOSPITAL_BASED_OUTPATIENT_CLINIC_OR_DEPARTMENT_OTHER): Payer: Self-pay

## 2019-06-12 ENCOUNTER — Ambulatory Visit (HOSPITAL_COMMUNITY): Payer: 59

## 2019-06-12 ENCOUNTER — Ambulatory Visit (HOSPITAL_BASED_OUTPATIENT_CLINIC_OR_DEPARTMENT_OTHER)
Admission: RE | Admit: 2019-06-12 | Discharge: 2019-06-12 | Disposition: A | Discharge status: Home / Self Care | Payer: 59 | Attending: Surgery | Admitting: Surgery

## 2019-06-12 ENCOUNTER — Other Ambulatory Visit: Payer: Self-pay

## 2019-06-12 ENCOUNTER — Ambulatory Visit (HOSPITAL_COMMUNITY)
Admission: RE | Admit: 2019-06-12 | Discharge: 2019-06-12 | Disposition: A | Discharge status: Home / Self Care | Payer: 59 | Source: Ambulatory Visit | Attending: Surgery | Admitting: Surgery

## 2019-06-12 ENCOUNTER — Encounter (HOSPITAL_BASED_OUTPATIENT_CLINIC_OR_DEPARTMENT_OTHER)
Admission: RE | Disposition: A | Discharge status: Home / Self Care | Payer: Self-pay | Source: Home / Self Care | Attending: Surgery

## 2019-06-12 DIAGNOSIS — Z170 Estrogen receptor positive status [ER+]: Secondary | ICD-10-CM

## 2019-06-12 DIAGNOSIS — Z6841 Body Mass Index (BMI) 40.0 and over, adult: Secondary | ICD-10-CM | POA: Diagnosis not present

## 2019-06-12 DIAGNOSIS — C50411 Malignant neoplasm of upper-outer quadrant of right female breast: Secondary | ICD-10-CM | POA: Insufficient documentation

## 2019-06-12 DIAGNOSIS — C773 Secondary and unspecified malignant neoplasm of axilla and upper limb lymph nodes: Secondary | ICD-10-CM | POA: Diagnosis not present

## 2019-06-12 DIAGNOSIS — C50421 Malignant neoplasm of upper-outer quadrant of right male breast: Secondary | ICD-10-CM

## 2019-06-12 DIAGNOSIS — I10 Essential (primary) hypertension: Secondary | ICD-10-CM | POA: Diagnosis not present

## 2019-06-12 DIAGNOSIS — Z95828 Presence of other vascular implants and grafts: Secondary | ICD-10-CM

## 2019-06-12 DIAGNOSIS — Z419 Encounter for procedure for purposes other than remedying health state, unspecified: Secondary | ICD-10-CM

## 2019-06-12 HISTORY — PX: PORTACATH PLACEMENT: SHX2246

## 2019-06-12 HISTORY — PX: BREAST LUMPECTOMY WITH RADIOACTIVE SEED AND SENTINEL LYMPH NODE BIOPSY: SHX6550

## 2019-06-12 IMAGING — DX MM BREAST SURGICAL SPECIMEN
1 series · 2 of 2 positions shown · non-contrast
Comparison: Previous exam(s).

CLINICAL DATA: Status post radioactive seed localized RIGHT
lumpectomy

EXAM:
SPECIMEN RADIOGRAPH OF THE RIGHT BREAST

[Series 2: specimen digital x-ray, derived · right · 2 of 2 slices shown]
[im 1/2]
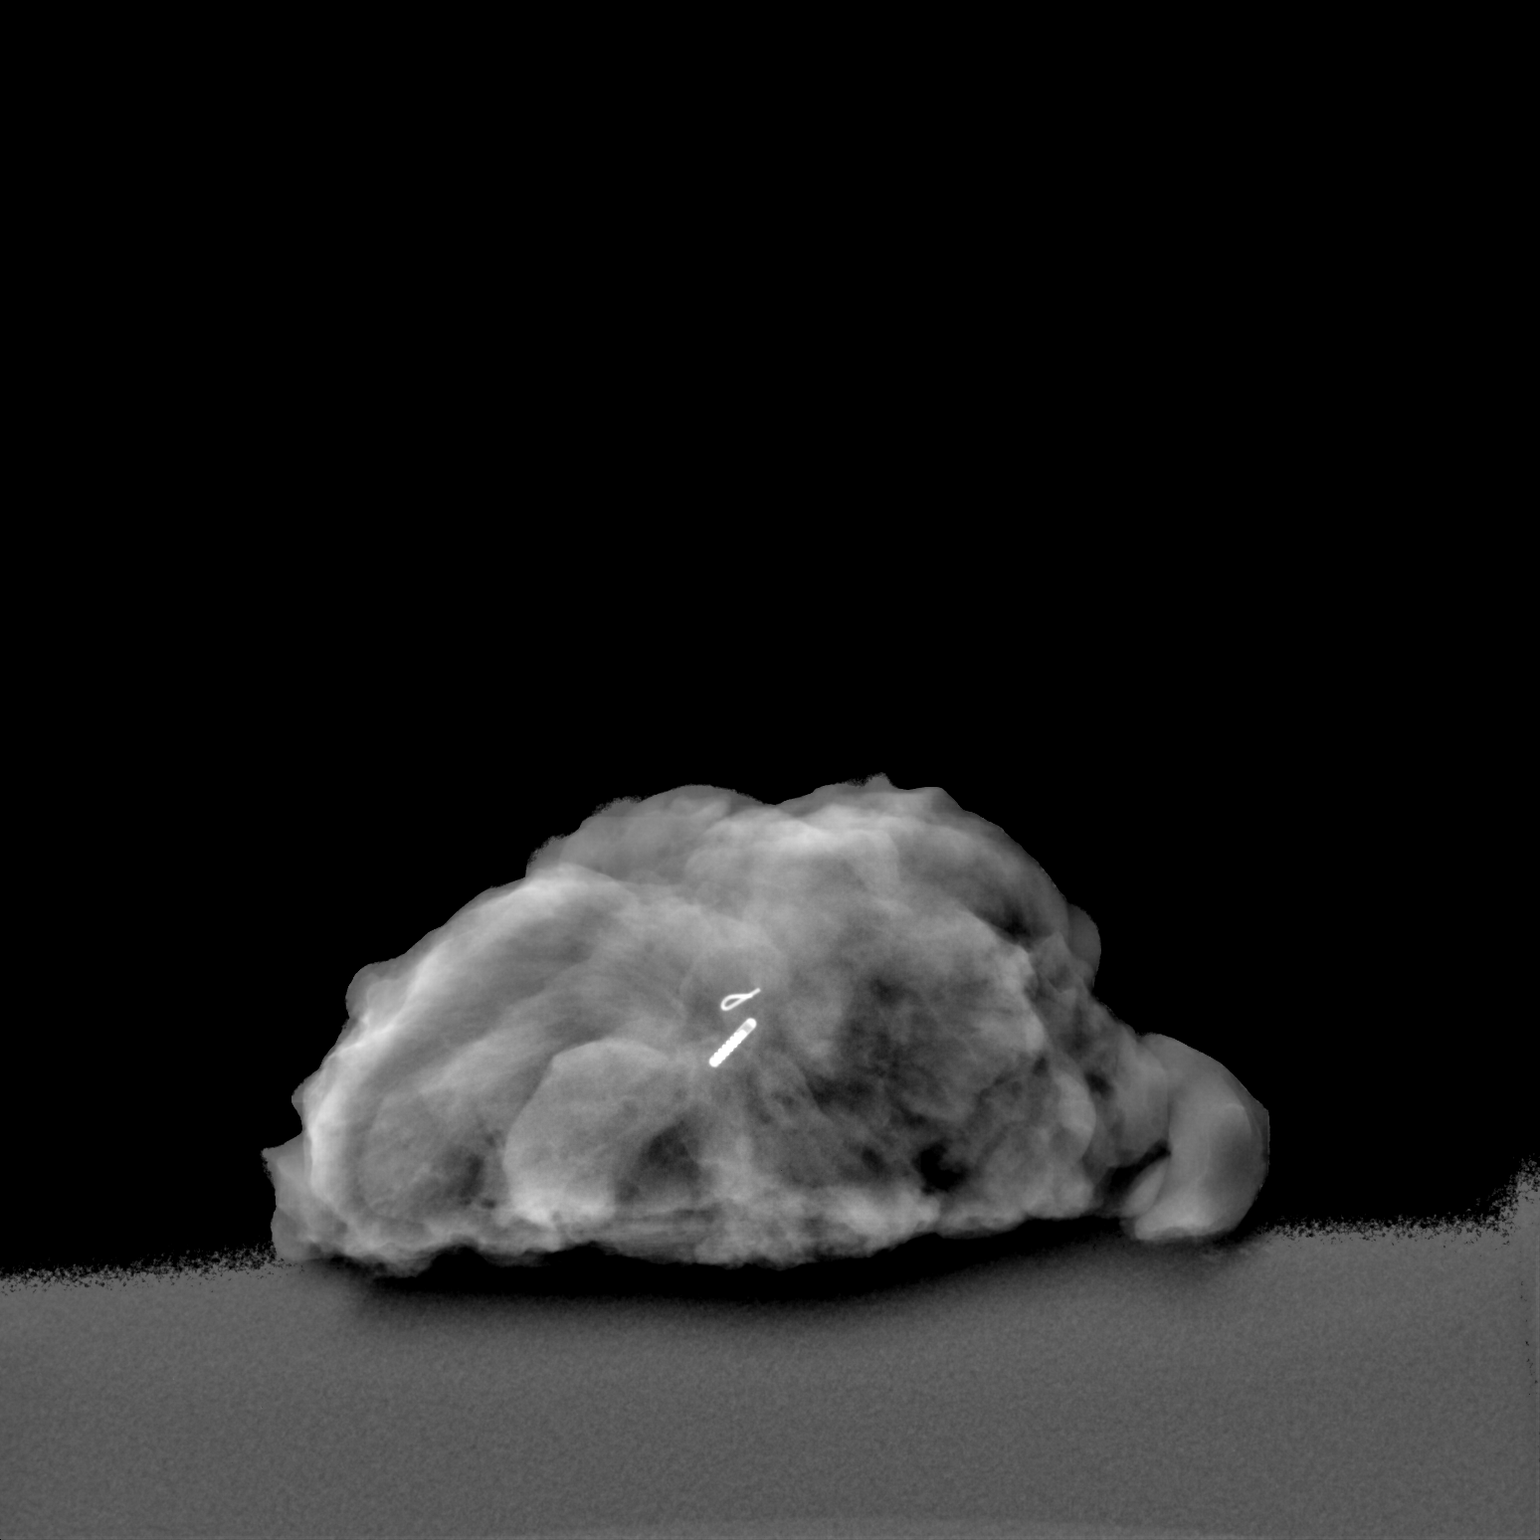
[im 2/2]
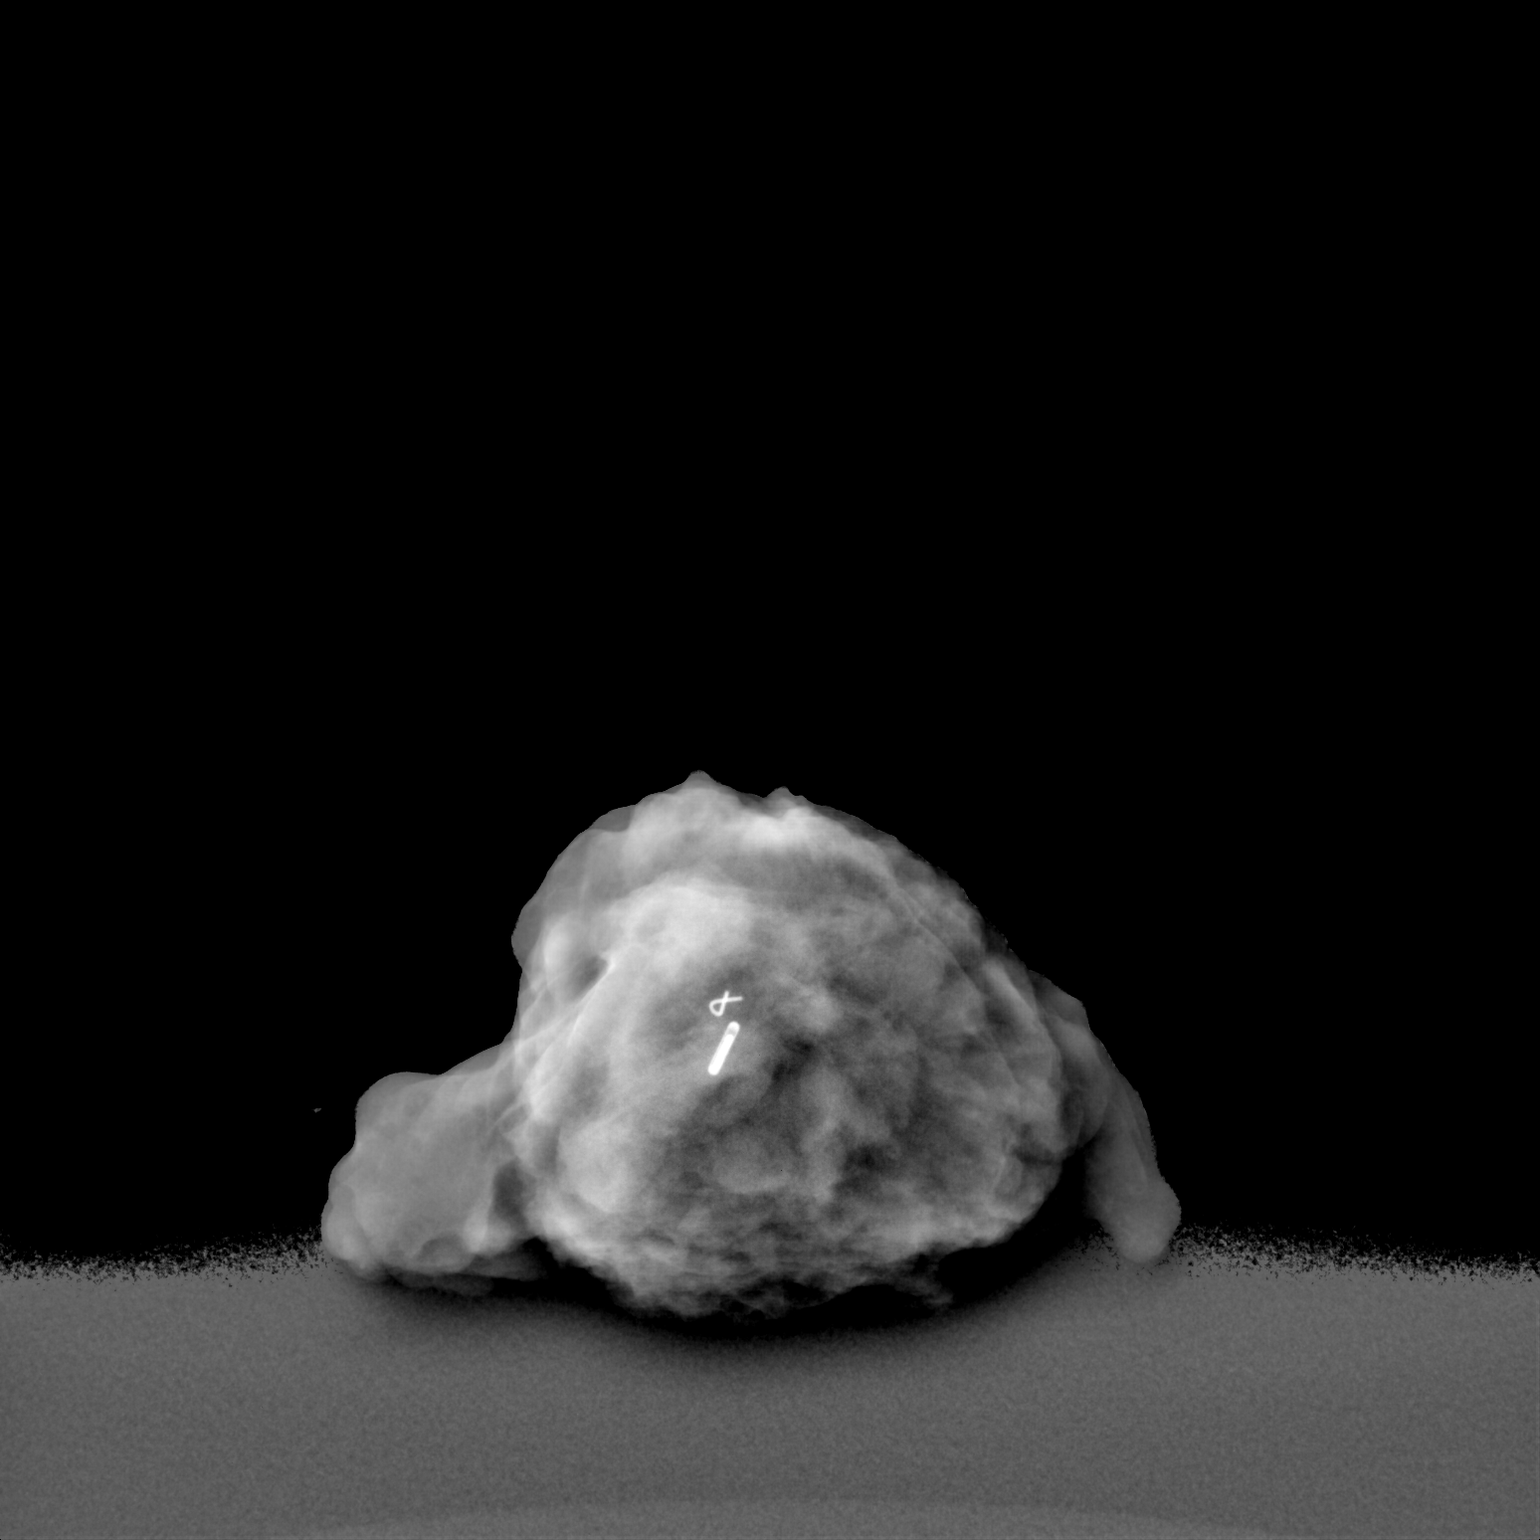

[2 of 2 positions shown; findings below may reference images not displayed]

FINDINGS: Status post excision of the right breast. The radioactive seed and
ribbon shaped biopsy marker clip are present, completely intact, and
were marked for pathology.
IMPRESSION: Specimen radiograph of the right breast.

## 2019-06-12 IMAGING — CR DG CHEST 1V PORT
1 series · 1 of 1 positions shown · non-contrast
Comparison: None.

CLINICAL DATA: Port-A-Cath placement

EXAM:
PORTABLE CHEST 1 VIEW

[chest ap]
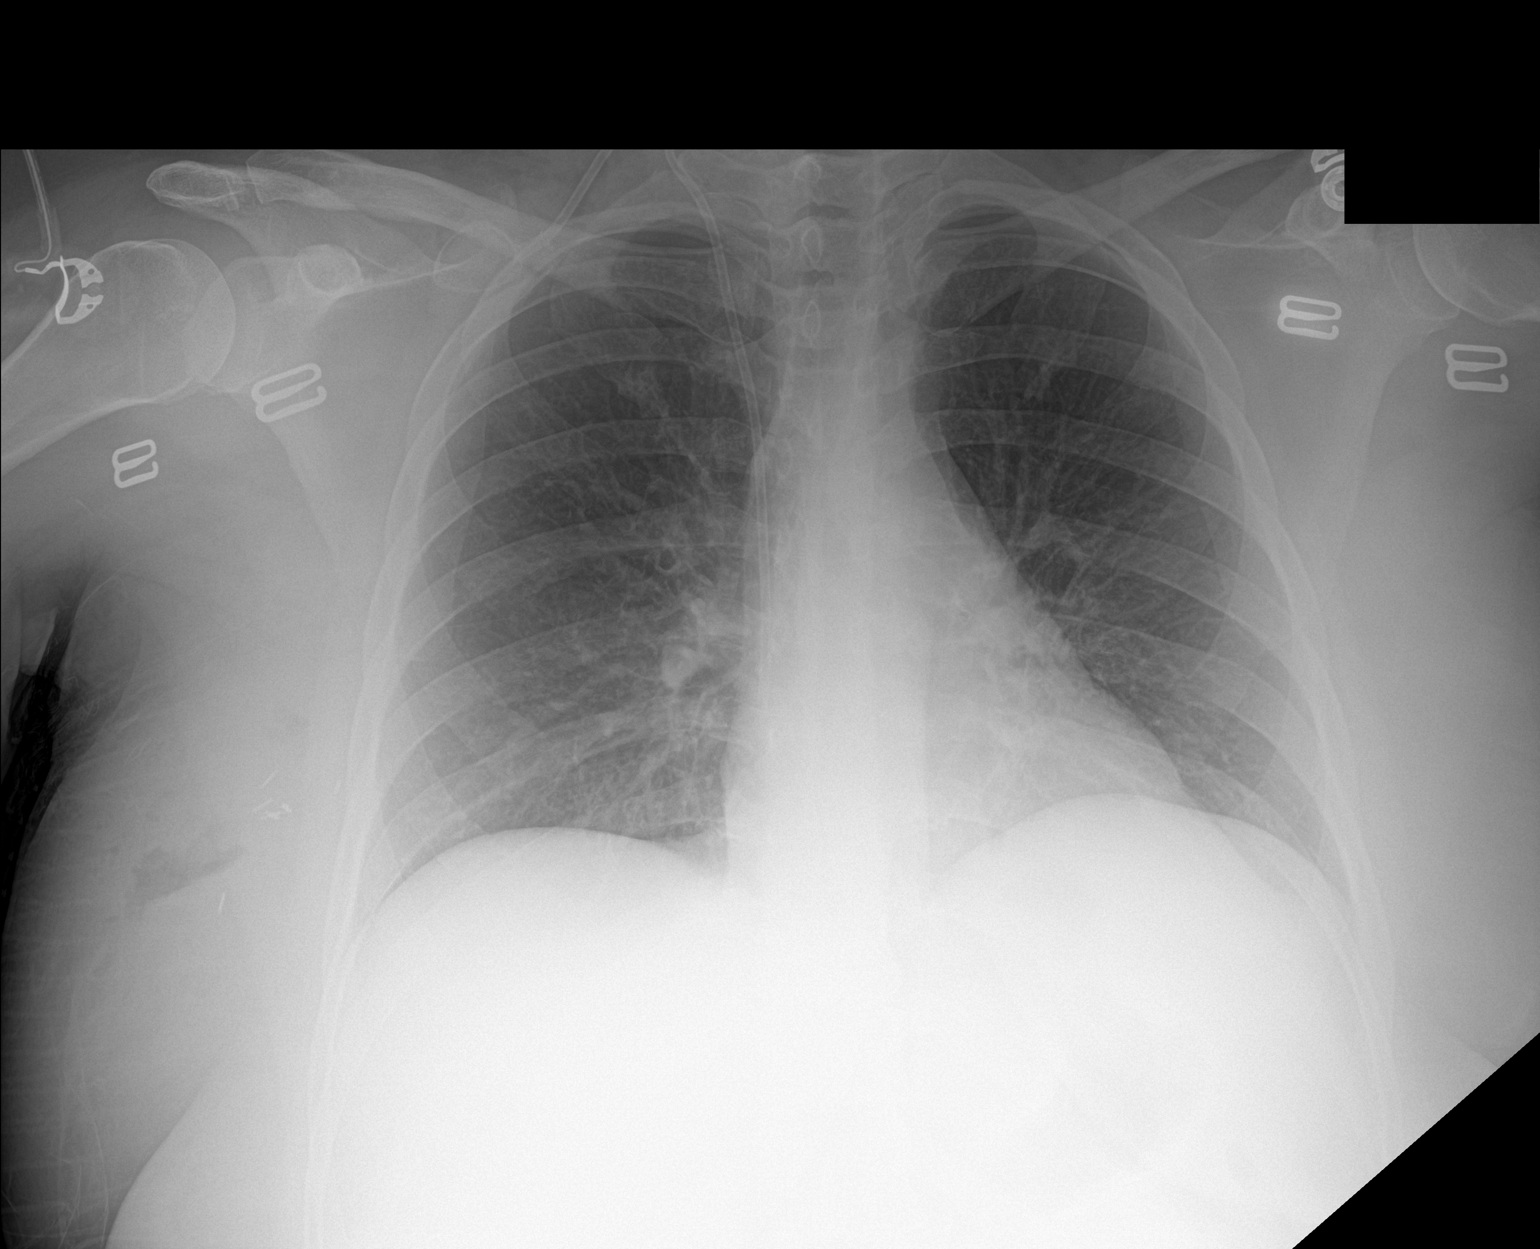

[1 of 1 positions shown; findings below may reference images not displayed]

FINDINGS: Right chest port catheter placement with tip position near the
superior cavoatrial junction. The heart size and mediastinal
contours are within normal limits. Both lungs are clear. The
visualized skeletal structures are unremarkable. Surgical clips and
subcutaneous air lucency in the right breast.
IMPRESSION: Right chest port catheter placement with tip position near the
superior cavoatrial junction. No acute abnormality of the lungs.

## 2019-06-12 SURGERY — BREAST LUMPECTOMY WITH RADIOACTIVE SEED AND SENTINEL LYMPH NODE BIOPSY
Anesthesia: General | Site: Chest | Laterality: Right

## 2019-06-12 MED ORDER — 0.9 % SODIUM CHLORIDE (POUR BTL) OPTIME
TOPICAL | Status: DC | PRN
  Administered 2019-06-12: 1000 mL

## 2019-06-12 MED ORDER — CEFAZOLIN SODIUM-DEXTROSE 2-4 GM/100ML-% IV SOLN
INTRAVENOUS | Status: AC
  Filled 2019-06-12: qty 100

## 2019-06-12 MED ORDER — GABAPENTIN 300 MG PO CAPS
300.0000 mg | ORAL_CAPSULE | ORAL | Status: AC
  Administered 2019-06-12: 300 mg via ORAL

## 2019-06-12 MED ORDER — CHLORHEXIDINE GLUCONATE CLOTH 2 % EX PADS: 6.0000 | MEDICATED_PAD | Freq: Once | CUTANEOUS | Status: DC

## 2019-06-12 MED ORDER — SODIUM CHLORIDE 0.9 % IV SOLN
INTRAVENOUS | Status: DC | PRN
  Administered 2019-06-12: 11:00:00 50 ug/min via INTRAVENOUS

## 2019-06-12 MED ORDER — ACETAMINOPHEN 500 MG PO TABS
1000.0000 mg | ORAL_TABLET | ORAL | Status: AC
  Administered 2019-06-12: 1000 mg via ORAL

## 2019-06-12 MED ORDER — CEFAZOLIN SODIUM-DEXTROSE 2-4 GM/100ML-% IV SOLN
2.0000 g | Freq: Once | INTRAVENOUS | Status: AC
  Administered 2019-06-12: 2 g via INTRAVENOUS

## 2019-06-12 MED ORDER — PROPOFOL 10 MG/ML IV BOLUS
INTRAVENOUS | Status: AC
  Filled 2019-06-12: qty 20

## 2019-06-12 MED ORDER — SODIUM CHLORIDE (PF) 0.9 % IJ SOLN
INTRAVENOUS | Status: DC | PRN
  Administered 2019-06-12: 5 mL

## 2019-06-12 MED ORDER — PHENYLEPHRINE HCL (PRESSORS) 10 MG/ML IV SOLN
INTRAVENOUS | Status: AC
  Filled 2019-06-12: qty 1

## 2019-06-12 MED ORDER — ROPIVACAINE HCL 5 MG/ML IJ SOLN
INTRAMUSCULAR | Status: DC | PRN
  Administered 2019-06-12: 30 mL

## 2019-06-12 MED ORDER — FENTANYL CITRATE (PF) 100 MCG/2ML IJ SOLN
INTRAMUSCULAR | Status: AC
  Filled 2019-06-12: qty 2

## 2019-06-12 MED ORDER — EPHEDRINE SULFATE 50 MG/ML IJ SOLN
INTRAMUSCULAR | Status: DC | PRN
  Administered 2019-06-12 (×2): 5 mg via INTRAVENOUS
  Administered 2019-06-12 (×4): 10 mg via INTRAVENOUS

## 2019-06-12 MED ORDER — DEXTROSE 5 % IV SOLN: 3.0000 g | INTRAVENOUS | Status: DC

## 2019-06-12 MED ORDER — ONDANSETRON HCL 4 MG/2ML IJ SOLN
INTRAMUSCULAR | Status: DC | PRN
  Administered 2019-06-12: 4 mg via INTRAVENOUS

## 2019-06-12 MED ORDER — ACETAMINOPHEN 500 MG PO TABS
ORAL_TABLET | ORAL | Status: AC
  Filled 2019-06-12: qty 2

## 2019-06-12 MED ORDER — PHENYLEPHRINE 40 MCG/ML (10ML) SYRINGE FOR IV PUSH (FOR BLOOD PRESSURE SUPPORT)
PREFILLED_SYRINGE | INTRAVENOUS | Status: AC
  Filled 2019-06-12: qty 10

## 2019-06-12 MED ORDER — PHENYLEPHRINE HCL (PRESSORS) 10 MG/ML IV SOLN
INTRAVENOUS | Status: DC | PRN
  Administered 2019-06-12: 80 ug via INTRAVENOUS
  Administered 2019-06-12 (×2): 40 ug via INTRAVENOUS
  Administered 2019-06-12: 80 ug via INTRAVENOUS
  Administered 2019-06-12: 40 ug via INTRAVENOUS

## 2019-06-12 MED ORDER — OXYCODONE HCL 5 MG PO TABS: 5.0000 mg | ORAL_TABLET | Freq: Four times a day (QID) | ORAL | 0 refills | Status: DC | PRN

## 2019-06-12 MED ORDER — EPHEDRINE 5 MG/ML INJ
INTRAVENOUS | Status: AC
  Filled 2019-06-12: qty 10

## 2019-06-12 MED ORDER — FENTANYL CITRATE (PF) 100 MCG/2ML IJ SOLN
25.0000 ug | INTRAMUSCULAR | Status: DC | PRN
  Administered 2019-06-12: 50 ug via INTRAVENOUS

## 2019-06-12 MED ORDER — DEXAMETHASONE SODIUM PHOSPHATE 4 MG/ML IJ SOLN
INTRAMUSCULAR | Status: DC | PRN
  Administered 2019-06-12: 4 mg via INTRAVENOUS

## 2019-06-12 MED ORDER — FENTANYL CITRATE (PF) 100 MCG/2ML IJ SOLN
50.0000 ug | INTRAMUSCULAR | Status: DC | PRN
  Administered 2019-06-12: 100 ug via INTRAVENOUS

## 2019-06-12 MED ORDER — TECHNETIUM TC 99M SULFUR COLLOID FILTERED
1.0000 | Freq: Once | INTRAVENOUS | Status: AC | PRN
  Administered 2019-06-12: 10:00:00 1 via INTRADERMAL

## 2019-06-12 MED ORDER — BUPIVACAINE-EPINEPHRINE (PF) 0.25% -1:200000 IJ SOLN
INTRAMUSCULAR | Status: DC | PRN
  Administered 2019-06-12: 30 mL

## 2019-06-12 MED ORDER — GABAPENTIN 300 MG PO CAPS
ORAL_CAPSULE | ORAL | Status: AC
  Filled 2019-06-12: qty 1

## 2019-06-12 MED ORDER — LACTATED RINGERS IV SOLN: INTRAVENOUS | Status: DC

## 2019-06-12 MED ORDER — MIDAZOLAM HCL 2 MG/2ML IJ SOLN
1.0000 mg | INTRAMUSCULAR | Status: DC | PRN
  Administered 2019-06-12: 2 mg via INTRAVENOUS

## 2019-06-12 MED ORDER — METOCLOPRAMIDE HCL 5 MG/ML IJ SOLN: 10.0000 mg | Freq: Once | INTRAMUSCULAR | Status: DC | PRN

## 2019-06-12 MED ORDER — PROPOFOL 10 MG/ML IV BOLUS
INTRAVENOUS | Status: DC | PRN
  Administered 2019-06-12: 200 mg via INTRAVENOUS

## 2019-06-12 MED ORDER — LACTATED RINGERS IV SOLN
INTRAVENOUS | Status: DC
  Administered 2019-06-12: 13:00:00 via INTRAVENOUS

## 2019-06-12 MED ORDER — ARTIFICIAL TEARS OPHTHALMIC OINT
TOPICAL_OINTMENT | OPHTHALMIC | Status: AC
  Filled 2019-06-12: qty 3.5

## 2019-06-12 MED ORDER — ONDANSETRON HCL 4 MG/2ML IJ SOLN
INTRAMUSCULAR | Status: AC
  Filled 2019-06-12: qty 2

## 2019-06-12 MED ORDER — LACTATED RINGERS IV SOLN
INTRAVENOUS | Status: DC | PRN
  Administered 2019-06-12: 10:00:00 via INTRAVENOUS

## 2019-06-12 MED ORDER — CLINDAMYCIN PHOSPHATE 900 MG/50ML IV SOLN: 900.0000 mg | INTRAVENOUS | Status: DC

## 2019-06-12 MED ORDER — IBUPROFEN 800 MG PO TABS: 800.0000 mg | ORAL_TABLET | Freq: Three times a day (TID) | ORAL | 0 refills | Status: DC | PRN

## 2019-06-12 MED ORDER — MIDAZOLAM HCL 2 MG/2ML IJ SOLN
INTRAMUSCULAR | Status: AC
  Filled 2019-06-12: qty 2

## 2019-06-12 MED ORDER — MEPERIDINE HCL 25 MG/ML IJ SOLN: 6.2500 mg | INTRAMUSCULAR | Status: DC | PRN

## 2019-06-12 MED ORDER — HEPARIN (PORCINE) IN NACL 2-0.9 UNITS/ML
INTRAMUSCULAR | Status: AC | PRN
  Administered 2019-06-12: 1 via INTRAVENOUS

## 2019-06-12 MED ORDER — LACTATED RINGERS IV SOLN: INTRAVENOUS | Status: DC | PRN

## 2019-06-12 MED ORDER — DEXAMETHASONE SODIUM PHOSPHATE 10 MG/ML IJ SOLN
INTRAMUSCULAR | Status: AC
  Filled 2019-06-12: qty 1

## 2019-06-12 MED ORDER — HEPARIN SOD (PORK) LOCK FLUSH 100 UNIT/ML IV SOLN
INTRAVENOUS | Status: DC | PRN
  Administered 2019-06-12: 500 [IU] via INTRAVENOUS

## 2019-06-12 SURGICAL SUPPLY — 66 items
APPLIER CLIP 9.375 MED OPEN (MISCELLANEOUS) ×4
BAG DECANTER FOR FLEXI CONT (MISCELLANEOUS) ×4 IMPLANT
BENZOIN TINCTURE PRP APPL 2/3 (GAUZE/BANDAGES/DRESSINGS) IMPLANT
BINDER BREAST XLRG (GAUZE/BANDAGES/DRESSINGS) IMPLANT
BINDER BREAST XXLRG (GAUZE/BANDAGES/DRESSINGS) ×4 IMPLANT
BLADE HEX COATED 2.75 (ELECTRODE) ×4 IMPLANT
BLADE SURG 11 STRL SS (BLADE) ×4 IMPLANT
BLADE SURG 15 STRL LF DISP TIS (BLADE) ×4 IMPLANT
BLADE SURG 15 STRL SS (BLADE) ×4
CANISTER SUC SOCK COL 7IN (MISCELLANEOUS) IMPLANT
CANISTER SUCT 1200ML W/VALVE (MISCELLANEOUS) ×4 IMPLANT
CHLORAPREP W/TINT 26 (MISCELLANEOUS) ×8 IMPLANT
CLIP APPLIE 9.375 MED OPEN (MISCELLANEOUS) ×2 IMPLANT
CLOSURE WOUND 1/2 X4 (GAUZE/BANDAGES/DRESSINGS)
COVER BACK TABLE REUSABLE LG (DRAPES) ×4 IMPLANT
COVER MAYO STAND REUSABLE (DRAPES) ×4 IMPLANT
COVER PROBE 5X48 (MISCELLANEOUS) ×2
COVER PROBE W GEL 5X96 (DRAPES) ×4 IMPLANT
DECANTER SPIKE VIAL GLASS SM (MISCELLANEOUS) ×4 IMPLANT
DERMABOND ADVANCED (GAUZE/BANDAGES/DRESSINGS) ×4
DERMABOND ADVANCED .7 DNX12 (GAUZE/BANDAGES/DRESSINGS) ×4 IMPLANT
DRAPE C-ARM 42X72 X-RAY (DRAPES) ×4 IMPLANT
DRAPE LAPAROSCOPIC ABDOMINAL (DRAPES) ×8 IMPLANT
DRAPE UTILITY XL STRL (DRAPES) ×8 IMPLANT
ELECT COATED BLADE 2.86 ST (ELECTRODE) ×4 IMPLANT
ELECT REM PT RETURN 9FT ADLT (ELECTROSURGICAL) ×4
ELECTRODE REM PT RTRN 9FT ADLT (ELECTROSURGICAL) ×2 IMPLANT
GAUZE SPONGE 4X4 12PLY STRL LF (GAUZE/BANDAGES/DRESSINGS) IMPLANT
GLOVE BIO SURGEON STRL SZ 6.5 (GLOVE) ×6 IMPLANT
GLOVE BIO SURGEONS STRL SZ 6.5 (GLOVE) ×2
GLOVE BIOGEL PI IND STRL 7.0 (GLOVE) ×4 IMPLANT
GLOVE BIOGEL PI IND STRL 7.5 (GLOVE) ×4 IMPLANT
GLOVE BIOGEL PI IND STRL 8 (GLOVE) ×4 IMPLANT
GLOVE BIOGEL PI INDICATOR 7.0 (GLOVE) ×4
GLOVE BIOGEL PI INDICATOR 7.5 (GLOVE) ×4
GLOVE BIOGEL PI INDICATOR 8 (GLOVE) ×4
GLOVE ECLIPSE 8.0 STRL XLNG CF (GLOVE) ×8 IMPLANT
GLOVE SURG SS PI 7.0 STRL IVOR (GLOVE) ×8 IMPLANT
GOWN STRL REUS W/ TWL LRG LVL3 (GOWN DISPOSABLE) ×12 IMPLANT
GOWN STRL REUS W/TWL LRG LVL3 (GOWN DISPOSABLE) ×12
HEMOSTAT ARISTA ABSORB 3G PWDR (HEMOSTASIS) IMPLANT
HEMOSTAT SNOW SURGICEL 2X4 (HEMOSTASIS) IMPLANT
KIT CVR 48X5XPRB PLUP LF (MISCELLANEOUS) ×2 IMPLANT
KIT MARKER MARGIN INK (KITS) ×4 IMPLANT
KIT PORT POWER 8FR ISP CVUE (Port) ×4 IMPLANT
NDL SAFETY ECLIPSE 18X1.5 (NEEDLE) ×2 IMPLANT
NEEDLE HYPO 18GX1.5 SHARP (NEEDLE) ×2
NEEDLE HYPO 22GX1.5 SAFETY (NEEDLE) IMPLANT
NEEDLE HYPO 25X1 1.5 SAFETY (NEEDLE) ×8 IMPLANT
NS IRRIG 1000ML POUR BTL (IV SOLUTION) ×4 IMPLANT
PACK BASIN DAY SURGERY FS (CUSTOM PROCEDURE TRAY) ×4 IMPLANT
PENCIL BUTTON HOLSTER BLD 10FT (ELECTRODE) ×4 IMPLANT
SLEEVE SCD COMPRESS KNEE MED (MISCELLANEOUS) ×4 IMPLANT
SPONGE LAP 4X18 RFD (DISPOSABLE) ×8 IMPLANT
STRIP CLOSURE SKIN 1/2X4 (GAUZE/BANDAGES/DRESSINGS) IMPLANT
SUT MNCRL AB 4-0 PS2 18 (SUTURE) ×4 IMPLANT
SUT MON AB 4-0 PC3 18 (SUTURE) ×4 IMPLANT
SUT PROLENE 2 0 SH DA (SUTURE) ×4 IMPLANT
SUT VICRYL 3-0 CR8 SH (SUTURE) ×8 IMPLANT
SYR 5ML LUER SLIP (SYRINGE) ×4 IMPLANT
SYR CONTROL 10ML LL (SYRINGE) ×8 IMPLANT
TOWEL GREEN STERILE FF (TOWEL DISPOSABLE) ×8 IMPLANT
TRAY FAXITRON CT DISP (TRAY / TRAY PROCEDURE) ×8 IMPLANT
TUBE CONNECTING 20'X1/4 (TUBING) ×1
TUBE CONNECTING 20X1/4 (TUBING) ×3 IMPLANT
YANKAUER SUCT BULB TIP NO VENT (SUCTIONS) ×4 IMPLANT

## 2019-06-12 NOTE — Interval H&P Note (Signed)
History and Physical Interval Note:  06/12/2019 9:46 AM  Veronica [name]  has presented today for surgery, with the diagnosis of RIGHT BREAST CANCER.  The various methods of treatment have been discussed with the patient and family. After consideration of risks, benefits and other options for treatment, the patient has consented to  Procedure(s) with comments: RIGHT BREAST RADIOACTIVE SEED LUMPECTOMY X2  AND RIGHT AXILLARY SENTINEL LYMPH NODE MAPPING (Right) - PEC BLOCK INSERTION PORT-A-CATH WITH ULTRASOUND (N/A) as a surgical intervention.  The patient's history has been reviewed, patient examined, no change in status, stable for surgery.  I have reviewed the patient's chart and labs.  Questions were answered to the patient's satisfaction.     [place]

## 2019-06-12 NOTE — Anesthesia Procedure Notes (Addendum)
Anesthesia Regional Block: Pectoralis block   Pre-Anesthetic Checklist: ,, timeout performed, Correct Patient, Correct Site, Correct Laterality, Correct Procedure, Correct Position, site marked, Risks and benefits discussed,  Surgical consent,  Pre-op evaluation,  At surgeon's request and post-op pain management  Laterality: Right  Prep: Maximum Sterile Barrier Precautions used, chloraprep       Needles:  Injection technique: Single-shot  Needle Type: Echogenic Stimulator Needle     Needle Length: 10cm      Additional Needles:   Procedures:,,,, ultrasound used (permanent image in chart),,,,  Narrative:  Start time: 06/12/2019 9:10 AM End time: 06/12/2019 9:22 AM Injection made incrementally with aspirations every 5 mL.  Performed by: Personally  Anesthesiologist: Montez Hageman, MD  Additional Notes: Risks, benefits and alternative to block explained extensively.  Patient tolerated procedure well, without complications.

## 2019-06-12 NOTE — Anesthesia Procedure Notes (Signed)
Procedure Name: LMA Insertion Date/Time: 06/12/2019 10:05 AM Performed by: Lavonia Dana, CRNA Pre-anesthesia Checklist: Patient identified, Emergency Drugs available, Suction available and Patient being monitored Patient Re-evaluated:Patient Re-evaluated prior to induction Oxygen Delivery Method: Circle system utilized Preoxygenation: Pre-oxygenation with 100% oxygen Induction Type: IV induction Ventilation: Mask ventilation without difficulty LMA: LMA inserted LMA Size: 4.0 Number of attempts: 1 Airway Equipment and Method: Bite block Placement Confirmation: positive ETCO2 Tube secured with: Tape Dental Injury: Teeth and Oropharynx as per pre-operative assessment

## 2019-06-12 NOTE — Anesthesia Postprocedure Evaluation (Signed)
Anesthesia Post Note  Patient: Veronica [name]  Procedure(s) Performed: RIGHT BREAST RADIOACTIVE SEED LUMPECTOMY  AND RIGHT AXILLARY SEED TARGETED LYMPH NODE AND AXILLARY SENTINEL LYMPH NODE MAPPING (Right Breast) INSERTION PORT-A-CATH WITH ULTRASOUND (Right Chest)     Patient location during evaluation: PACU Anesthesia Type: General Level of consciousness: awake and alert Pain management: pain level controlled Vital Signs Assessment: post-procedure vital signs reviewed and stable Respiratory status: spontaneous breathing, nonlabored ventilation, respiratory function stable and patient connected to nasal cannula oxygen Cardiovascular status: blood pressure returned to baseline and stable Postop Assessment: no apparent nausea or vomiting Anesthetic complications: no    Last Vitals:  Vitals:   06/12/19 1315 06/12/19 1325  BP: 129/69 134/87  Pulse: 86 79  Resp: 17 16  Temp:  36.6 C  SpO2: 100% 99%    Last Pain:  Vitals:   06/12/19 1325  TempSrc:   PainSc: 0-No pain                 Montez Hageman

## 2019-06-12 NOTE — H&P (Signed)
Veronica [name]  Location: [facility] Patient #: [id number] DOB: [birth year] Married / Language: English / Race: White Female  History of Present Illness  Patient words: Pt sent at the request of Dr Lisbeth Renshaw for mass in right breast UOQ. She reports some stinging pain and discoloration off and on for a year. Mammogram and U/S showed 1.7 cm mass and second mass in tail of breast core bx IDC grade 3 ER PO PR POS HER 2 NEU NEGATIVE ki 67 20 %.  The patient is a 48 year old female.   Past Surgical History  Breast Biopsy Right. Gallbladder Surgery - Laparoscopic Hysterectomy (not due to cancer) - Partial Oral Surgery  Diagnostic Studies History  Colonoscopy never Mammogram within last year Pap Smear 1-5 years ago  Medication History  Medications Reconciled  Social History  Alcohol use Occasional alcohol use. Caffeine use Carbonated beverages, Coffee. No drug use Tobacco use Never smoker.  Family History  Anesthetic complications Daughter. Arthritis Mother. Cancer Brother, Family Members In General. Cerebrovascular Accident Brother. Diabetes Mellitus Brother. Hypertension Mother. Ischemic Bowel Disease Daughter. Migraine Headache Daughter, Mother. Respiratory Condition Sister.  Pregnancy / Birth History  Age at menarche 9 years. Gravida 4 Irregular periods Maternal age 5-20 Para 2  Other Problems  High blood pressure     Review of Systems General Not Present- Appetite Loss, Chills, Fatigue, Fever, Night Sweats, Weight Gain and Weight Loss. Skin Not Present- Change in Wart/Mole, Dryness, Hives, Jaundice, New Lesions, Non-Healing Wounds, Rash and Ulcer. HEENT Not Present- Earache, Hearing Loss, Hoarseness, Nose Bleed, Oral Ulcers, Ringing in the Ears, Seasonal Allergies, Sinus Pain, Sore Throat, Visual Disturbances, Wears glasses/contact lenses and Yellow Eyes. Respiratory Not Present- Bloody sputum, Chronic Cough,  Difficulty Breathing, Snoring and Wheezing. Breast Not Present- Breast Mass, Breast Pain, Nipple Discharge and Skin Changes. Cardiovascular Not Present- Chest Pain, Difficulty Breathing Lying Down, Leg Cramps, Palpitations, Rapid Heart Rate, Shortness of Breath and Swelling of Extremities. Gastrointestinal Not Present- Abdominal Pain, Bloating, Bloody Stool, Change in Bowel Habits, Chronic diarrhea, Constipation, Difficulty Swallowing, Excessive gas, Gets full quickly at meals, Hemorrhoids, Indigestion, Nausea, Rectal Pain and Vomiting. Female Genitourinary Not Present- Frequency, Nocturia, Painful Urination, Pelvic Pain and Urgency. Musculoskeletal Not Present- Back Pain, Joint Pain, Joint Stiffness, Muscle Pain, Muscle Weakness and Swelling of Extremities. Neurological Not Present- Decreased Memory, Fainting, Headaches, Numbness, Seizures, Tingling, Tremor, Trouble walking and Weakness. Psychiatric Not Present- Anxiety, Bipolar, Change in Sleep Pattern, Depression, Fearful and Frequent crying. Endocrine Not Present- Cold Intolerance, Excessive Hunger, Hair Changes, Heat Intolerance, Hot flashes and New Diabetes. Hematology Not Present- Blood Thinners, Easy Bruising, Excessive bleeding, Gland problems, HIV and Persistent Infections. All other systems negative   Physical Exam   General Mental Status-Alert. General Appearance-Consistent with stated age. Hydration-Well hydrated. Voice-Normal.  Head and Neck Head-normocephalic, atraumatic with no lesions or palpable masses. Trachea-midline. Thyroid Gland Characteristics - normal size and consistency.  Chest and Lung Exam Chest and lung exam reveals -quiet, even and easy respiratory effort with no use of accessory muscles and on auscultation, normal breath sounds, no adventitious sounds and normal vocal resonance. Inspection Chest Wall - Normal. Back - normal.  Breast Breast - Left-Symmetric, Non Tender, No Biopsy  scars, no Dimpling - Left, No Inflammation, No Lumpectomy scars, No Mastectomy scars, No Peau d' Orange. Breast - Right-Symmetric, Non Tender, No Biopsy scars, no Dimpling - Right, No Inflammation, No Lumpectomy scars, No Mastectomy scars, No Peau d' Orange. Breast Lump-No Palpable Breast Mass. Note: mild right breast  bruising RUOQ  Cardiovascular Cardiovascular examination reveals -normal heart sounds, regular rate and rhythm with no murmurs and normal pedal pulses bilaterally.  Neurologic Neurologic evaluation reveals -alert and oriented x 3 with no impairment of recent or remote memory. Mental Status-Normal.  Musculoskeletal Normal Exam - Left-Upper Extremity Strength Normal and Lower Extremity Strength Normal. Normal Exam - Right-Upper Extremity Strength Normal and Lower Extremity Strength Normal.  Lymphatic Head & Neck  General Head & Neck Lymphatics: Bilateral - Description - Normal. Axillary  General Axillary Region: Bilateral - Description - Normal. Tenderness - Non Tender.    Assessment & Plan   BREAST CANCER, RIGHT (C50.911) Impression: PT opted for right breast lumpectomy and right sentinel lymph node mapping Risk of lumpectomy include bleeding, infection, seroma, more surgery, use of seed/wire, wound care, cosmetic deformity and the need for other treatments, death , blood clots, death. Pt agrees to proceed. Risk of sentinel lymph node mapping include bleeding, infection, lymphedema, shoulder pain. stiffness, dye allergy. cosmetic deformity , blood clots, death, need for more surgery. Pt agrees to proceed.    GENETICS  Current Plans You are being scheduled for surgery- Our schedulers will call you.  You should hear from our office's scheduling department within 5 working days about the location, date, and time of surgery. We try to make accommodations for patient's preferences in scheduling surgery, but sometimes the OR schedule or  the surgeon's schedule prevents [place] from making those accommodations.  If you have not heard from our office [phone]) in 5 working days, call the office and ask for your surgeon's nurse.  If you have other questions about your diagnosis, plan, or surgery, call the office and ask for your surgeon's nurse.  Pt Education - [facility] Breast Cancer Information Given - [employer] "Breast Journey" Package Pt Education - Pamphlet Given - Breast Biopsy: discussed with patient and provided information. We discussed the staging and pathophysiology of breast cancer. We discussed all of the different options for treatment for breast cancer including surgery, chemotherapy, radiation therapy, Herceptin, and antiestrogen therapy. We discussed a sentinel lymph node biopsy as she does not appear to having lymph node involvement right now. We discussed the performance of that with injection of radioactive tracer and blue dye. We discussed that she would have an incision underneath her axillary hairline. We discussed that there is a bout a 10-20% chance of having a positive node with a sentinel lymph node biopsy and we will await the permanent pathology to make any other first further decisions in terms of her treatment. One of these options might be to return to the operating room to perform an axillary lymph node dissection. We discussed about a 1-2% risk lifetime of chronic shoulder pain as well as lymphedema associated with a sentinel lymph node biopsy. We discussed the options for treatment of the breast cancer which included lumpectomy versus a mastectomy. We discussed the performance of the lumpectomy with a wire placement. We discussed a 10-20% chance of a positive margin requiring reexcision in the operating room. We also discussed that she may need radiation therapy or antiestrogen therapy or both if she undergoes lumpectomy. We discussed the mastectomy and the postoperative care for that as well. We discussed that  there is no difference in her survival whether she undergoes lumpectomy with radiation therapy or antiestrogen therapy versus a mastectomy. There is a slight difference in the local recurrence rate being 3-5% with lumpectomy and about 1% with a mastectomy. We discussed the risks of operation including  bleeding, infection, possible reoperation. She understands her further therapy will be based on what her stages at the time of her operation.  Pt Education - [facility] breast cancer surgery: discussed with patient and provided information. Pt Education - [facility] Breast Biopsy HCI: discussed with patient and provided information. Pt Education - ABC (After Breast Cancer) Class Info: discussed with patient and provided information. Pt Education - [facility] Breast Pains Education

## 2019-06-12 NOTE — Progress Notes (Signed)
nuc med injections done, emotional support provided.

## 2019-06-12 NOTE — Transfer of Care (Signed)
Immediate Anesthesia Transfer of Care Note  Patient: Veronica [name]  Procedure(s) Performed: RIGHT BREAST RADIOACTIVE SEED LUMPECTOMY  AND RIGHT AXILLARY SEED TARGETED LYMPH NODE AND AXILLARY SENTINEL LYMPH NODE MAPPING (Right Breast) INSERTION PORT-A-CATH WITH ULTRASOUND (Right Chest)  Patient Location: PACU  Anesthesia Type:General and Regional  Level of Consciousness: drowsy  Airway & Oxygen Therapy: Patient Spontanous Breathing and Patient connected to face mask oxygen  Post-op Assessment: Report given to RN and Post -op Vital signs reviewed and stable  Post vital signs: Reviewed and stable  Last Vitals:  Vitals Value Taken Time  BP    Temp    Pulse 94 06/12/19 1223  Resp 18 06/12/19 1223  SpO2 100 % 06/12/19 1223  Vitals shown include unvalidated device data.  Last Pain:  Vitals:   06/12/19 0820  TempSrc: Oral      Patients Stated Pain Goal: 5 ([id number])  Complications: No apparent anesthesia complications

## 2019-06-12 NOTE — Anesthesia Preprocedure Evaluation (Signed)
Anesthesia Evaluation  Patient identified by MRN, date of birth, ID band Patient awake    Reviewed: Allergy & Precautions, NPO status , Patient's Chart, lab work & pertinent test results  Airway Mallampati: II  TM Distance: >3 FB Neck ROM: Full    Dental no notable dental hx.    Pulmonary neg pulmonary ROS,    Pulmonary exam normal breath sounds clear to auscultation       Cardiovascular hypertension, Pt. on medications negative cardio ROS Normal cardiovascular exam Rhythm:Regular Rate:Normal     Neuro/Psych negative neurological ROS  negative psych ROS   GI/Hepatic negative GI ROS, Neg liver ROS,   Endo/Other  Morbid obesity  Renal/GU negative Renal ROS  negative genitourinary   Musculoskeletal negative musculoskeletal ROS (+)   Abdominal   Peds negative pediatric ROS (+)  Hematology negative hematology ROS (+)   Anesthesia Other Findings   Reproductive/Obstetrics negative OB ROS                            Anesthesia Physical Anesthesia Plan  ASA: III  Anesthesia Plan: General   Post-op Pain Management:  Regional for Post-op pain   Induction: Intravenous  PONV Risk Score and Plan: 3 and Ondansetron, Dexamethasone, Midazolam and Treatment may vary due to age or medical condition  Airway Management Planned: LMA  Additional Equipment:   Intra-op Plan:   Post-operative Plan: Extubation in OR  Informed Consent: I have reviewed the patients History and Physical, chart, labs and discussed the procedure including the risks, benefits and alternatives for the proposed anesthesia with the patient or authorized representative who has indicated his/her understanding and acceptance.     Dental advisory given  Plan Discussed with: CRNA  Anesthesia Plan Comments:         Anesthesia Quick Evaluation

## 2019-06-12 NOTE — Discharge Instructions (Signed)
[facility] °Office Phone Number [phone] ° °BREAST BIOPSY/ PARTIAL MASTECTOMY: POST OP INSTRUCTIONS ° °Always review your discharge instruction sheet given to you by the facility where your surgery was performed. ° °IF YOU HAVE DISABILITY OR FAMILY LEAVE FORMS, YOU MUST BRING THEM TO THE OFFICE FOR PROCESSING.  DO NOT GIVE THEM TO YOUR DOCTOR. ° °1. A prescription for pain medication may be given to you upon discharge.  Take your pain medication as prescribed, if needed.  If narcotic pain medicine is not needed, then you may take acetaminophen (Tylenol) or ibuprofen (Advil) as needed. °2. Take your usually prescribed medications unless otherwise directed °3. If you need a refill on your pain medication, please contact your pharmacy.  They will contact our office to request authorization.  Prescriptions will not be filled after 5pm or on week-ends. °4. You should eat very light the first 24 hours after surgery, such as soup, crackers, pudding, etc.  Resume your normal diet the day after surgery. °5. Most patients will experience some swelling and bruising in the breast.  Ice packs and a good support bra will help.  Swelling and bruising can take several days to resolve.  °6. It is common to experience some constipation if taking pain medication after surgery.  Increasing fluid intake and taking a stool softener will usually help or prevent this problem from occurring.  A mild laxative (Milk of Magnesia or Miralax) should be taken according to package directions if there are no bowel movements after 48 hours. °7. Unless discharge instructions indicate otherwise, you may remove your bandages 24-48 hours after surgery, and you may shower at that time.  You may have steri-strips (small skin tapes) in place directly over the incision.  These strips should be left on the skin for 7-10 days.  If your surgeon used skin glue on the incision, you may shower in 24 hours.  The glue will flake off over the  next 2-3 weeks.  Any sutures or staples will be removed at the office during your follow-up visit. °8. ACTIVITIES:  You may resume regular daily activities (gradually increasing) beginning the next day.  Wearing a good support bra or sports bra minimizes pain and swelling.  You may have sexual intercourse when it is comfortable. °a. You may drive when you no longer are taking prescription pain medication, you can comfortably wear a seatbelt, and you can safely maneuver your car and apply brakes. °b. RETURN TO WORK:  ______________________________________________________________________________________ °9. You should see your doctor in the office for a follow-up appointment approximately two weeks after your surgery.  Your doctor’s nurse will typically make your follow-up appointment when she calls you with your pathology report.  Expect your pathology report 2-3 business days after your surgery.  You may call to check if you do not hear from us after three days. °10. OTHER INSTRUCTIONS: _______________________________________________________________________________________________ _____________________________________________________________________________________________________________________________________ °_____________________________________________________________________________________________________________________________________ °_____________________________________________________________________________________________________________________________________ ° °WHEN TO CALL YOUR DOCTOR: °1. Fever over 101.0 °2. Nausea and/or vomiting. °3. Extreme swelling or bruising. °4. Continued bleeding from incision. °5. Increased pain, redness, or drainage from the incision. ° °The clinic staff is available to answer your questions during regular business hours.  Please don’t hesitate to call and ask to speak to one of the nurses for clinical concerns.  If you have a medical emergency, go to the nearest  emergency room or call 911.  A surgeon from [facility] is always on call at the hospital. ° °For further questions, please visit [email or web address]  ° ° ° ° °  PORT-A-CATH: POST OP INSTRUCTIONS  Always review your discharge instruction sheet given to you by the facility where your surgery was performed.   1. A prescription for pain medication may be given to you upon discharge. Take your pain medication as prescribed, if needed. If narcotic pain medicine is not needed, then you make take acetaminophen (Tylenol) or ibuprofen (Advil) as needed.  2. Take your usually prescribed medications unless otherwise directed. 3. If you need a refill on your pain medication, please contact our office. All narcotic pain medicine now requires a paper prescription.  Phoned in and fax refills are no longer allowed by law.  Prescriptions will not be filled after 5 pm or on weekends.  4. You should follow a light diet for the remainder of the day after your procedure. 5. Most patients will experience some mild swelling and/or bruising in the area of the incision. It may take several days to resolve. 6. It is common to experience some constipation if taking pain medication after surgery. Increasing fluid intake and taking a stool softener (such as Colace) will usually help or prevent this problem from occurring. A mild laxative (Milk of Magnesia or Miralax) should be taken according to package directions if there are no bowel movements after 48 hours.  7. Unless discharge instructions indicate otherwise, you may remove your bandages 48 hours after surgery, and you may shower at that time. You may have steri-strips (small white skin tapes) in place directly over the incision.  These strips should be left on the skin for 7-10 days.  If your surgeon used Dermabond (skin glue) on the incision, you may shower in 24 hours.  The glue will flake off over the next 2-3 weeks.  8. If your port is left accessed at the  end of surgery (needle left in port), the dressing cannot get wet and should only by changed by a healthcare professional. When the port is no longer accessed (when the needle has been removed), follow step 7.   9. ACTIVITIES:  Limit activity involving your arms for the next 72 hours. Do no strenuous exercise or activity for 1 week. You may drive when you are no longer taking prescription pain medication, you can comfortably wear a seatbelt, and you can maneuver your car. 10.You may need to see your doctor in the office for a follow-up appointment.  Please       check with your doctor.  11.When you receive a new Port-a-Cath, you will get a product guide and        ID card.  Please keep them in case you need them.  WHEN TO CALL YOUR DOCTOR [phone]): 1. Fever over 101.0 2. Chills 3. Continued bleeding from incision 4. Increased redness and tenderness at the site 5. Shortness of breath, difficulty breathing   The clinic staff is available to answer your questions during regular business hours. Please dont hesitate to call and ask to speak to one of the nurses or medical assistants for clinical concerns. If you have a medical emergency, go to the nearest emergency room or call 911.  A surgeon from [facility] is always on call at the hospital.     For further information, please visit [email or web address]      Post Anesthesia Home Care Instructions  Activity: Get plenty of rest for the remainder of the day. A responsible individual must stay with you for 24 hours following the procedure.  For the next 24 hours, DO NOT: -Drive a  car -Paediatric nurse -Drink alcoholic beverages -Take any medication unless instructed by your physician -Make any legal decisions or sign important papers.  Meals: Start with liquid foods such as gelatin or soup. Progress to regular foods as tolerated. Avoid greasy, spicy, heavy foods. If nausea and/or vomiting occur, drink  only clear liquids until the nausea and/or vomiting subsides. Call your physician if vomiting continues.  Special Instructions/Symptoms: Your throat may feel dry or sore from the anesthesia or the breathing tube placed in your throat during surgery. If this causes discomfort, gargle with warm salt water. The discomfort should disappear within 24 hours.  If you had a scopolamine patch placed behind your ear for the management of post- operative nausea and/or vomiting:  1. The medication in the patch is effective for 72 hours, after which it should be removed.  Wrap patch in a tissue and discard in the trash. Wash hands thoroughly with soap and water. 2. You may remove the patch earlier than 72 hours if you experience unpleasant side effects which may include dry mouth, dizziness or visual disturbances. 3. Avoid touching the patch. Wash your hands with soap and water after contact with the patch.  No Tylenol until after 2:30 pm.

## 2019-06-12 NOTE — Progress Notes (Signed)
Assisted Dr. Carignan with right, ultrasound guided, pectoralis block. Side rails up, monitors on throughout procedure. See vital signs in flow sheet. Tolerated Procedure well. 

## 2019-06-12 NOTE — Op Note (Signed)
Preoperative diagnosis: Stage II right breast cancer  Postoperative diagnosis: Same  Procedure: Right breast seed localized lumpectomy with right axillary targeted lymph node biopsy and right axillary sentinel lymph node mapping with methylene blue dye with placement of 8 French [place] right internal jugular Port-A-Cath with C-arm guidance and ultrasound guidance    Surgeon: Erroll Luna, MD  Anesthesia: LMA with local and pectoral block  EBL: 30 cc  Drains: None  Specimens: Right breast lumpectomy with seed and clip verified by Faxitron with targeted right axillary sentinel node which was both a sentinel node and targeted node with the seed and clip and 2 additional right axillary sentinel nodes hot but not blue  IV fluids: Per anesthesia record  Indications for procedure: The patient is a 48 year old female was chosen right breast conserving surgery for stage II right breast cancer.  She is to undergo postoperative chemotherapy and a Port-A-Cath be placed as well.  We discussed options of conservative surgery to keep her breast versus mastectomy with reconstruction.  Risk and benefits and long-term expectations of all were discussed.  She opted for breast conservation treatment after being evaluated in the multidisciplinary breast clinic.  Risk of bleeding, infection, pneumothorax, hemothorax, mediastinal injury, death, blood clot, stroke, DVT, seroma, cosmetic deformity, numbness and the right inner aspect of her arm, wound infection, lymphedema, and other potential risks were discussed with the patient.  She agreed to proceed.  Description of procedure: The patient was met in the holding area and questions were answered.  Her right breast was evaluated the neoprobe and the seed was localized in both the right breast and right axilla.  She underwent injection of technetium sulfur colloid per radiology protocol.  She had a pectoral block per anesthesia on the right.  Questions were  answered.  She was taken back to the operating.  She is placed supine on the OR table.  After induction of general esthesia the port was placed first.  The right neck and right upper chest region were prepped and draped in sterile fashion timeout was performed.  Ultrasound was used to identify the right internal jugular vein.  A needle was placed under ultrasound guidance into this and a wire was fed through this.  The needle was removed and C-arm used to verify location of the wire going down the internal jugular on the right to the superior vena cava and down the inferior vena cava.  A small skin incision was made at the wire insertion site.  Local anesthetic was infiltrated along the insertion site and the tunneled site down just to below the area below the right clavicle.  A 3 cm incision was made.  A pocket was formed using my finger and cautery.  An 8 French Clear[place] port was brought in the field flushed and attached.  It was then tunneled from lower incision to the upper incision.  The catheter was cut to 20 cm.  With the patient in Trendelenburg I placed a dilator introducer over the wire moving the wire to and fro without resistance.  I then removed the dilator wire complex leave the introducer in place.  The cath was placed in the introducer and the sheath was peeled away without difficulty.  C-arm showed the tip to be in the mid SVC without complication.  It drew back quite easily and was flushed with heparinized saline.  5 cc of heparin saline concentrate were then placed in the port and was secured to the chest wall with 2-0 Prolene.  Incisions closed with 3-0 Vicryl and 4-0 Monocryl.  Dermabond applied.  The patient was then reprepped and redraped repositioned.  A timeout was then performed again.  The neoprobe was used to identify the seed in the right breast this was marked.  The seed in the right axilla was also marked and these were all in the upper outer quadrant in the right breast.  After  timeout was done again, I infiltrated the right breast with local anesthetic again.  Incision made in the right axilla.  Dissection was carried down to the seed that targeted the node.  This was also sent on [place] I checked it with technetium settings as well.  It was removed and sent to pathology.  Faxitron revealed the seed and noted to be together.  We then switch settings to technetium.  I found 3 additional seeds.  Of note 4 cc of methylene blue dye were injected prior to incision and massaged in a subareolar position.  There is good uptake of the dye in the lymphatics.  Unfortunately, the nodes that were removed were deep level 1 nodes and they were hot but not blue.  I saw no evidence of the blue tracer but the technetium uptake was good for the sentinel lymph node portion of the procedure.  Once those additional 2 nodes removed background counts approached 0.  The long thoracic nerve, thoracodorsal trunk and axillary vein were all preserved.  Hemostasis achieved with cautery.  The settings were then switched to iodine again.  The lumpectomy was performed the same incision and the right upper outer quadrant of the breast.  Dissection was carried down to help the neoprobe and all tissue around the seed and clip were excised.  Gross margins were negative.  Faxitron image revealed the seed and clip to be in the central specimen on 2 views.  Margins appeared grossly negative.  Hemostasis achieved.  Clips were placed to mark the cavity.  We then closed the incision with 3-0 Vicryl and 4-0 Monocryl.  Breast binder placed.  Dermabond placed prior to that.  All counts were found to be correct of sponge, needle and instruments.  Patient was then awoke extubated taken to recovery in satisfactory condition.

## 2019-06-13 ENCOUNTER — Telehealth: Payer: Self-pay | Admitting: Oncology

## 2019-06-13 ENCOUNTER — Ambulatory Visit: Payer: 59 | Admitting: Adult Health

## 2019-06-13 ENCOUNTER — Encounter (HOSPITAL_BASED_OUTPATIENT_CLINIC_OR_DEPARTMENT_OTHER): Payer: Self-pay | Admitting: Surgery

## 2019-06-13 NOTE — Telephone Encounter (Signed)
R/s appt per 9/1 sch message - pt is aware of appt date and time

## 2019-06-20 NOTE — Progress Notes (Addendum)
Veronica [name]:(336) [phone] [phone]     ID: Veronica [name] DOB: 10/13/1970  MR#: [id number]  MVE#:[id number]  Patient Care Team: Veronica [name], Veronica [name] as PCP - General (Physician Assistant) Veronica Kaufmann, RN as Oncology Nurse Navigator Veronica Germany, RN as Oncology Nurse Navigator Veronica Luna, MD as Consulting Physician (General Surgery) Veronica [name], Veronica Dad, MD as Consulting Physician (Oncology) Veronica Rudd, MD as Consulting Physician (Radiation Oncology) Veronica Cruel, MD OTHER MD:  CHIEF COMPLAINT: Estrogen receptor positive breast cancer  CURRENT TREATMENT:    INTERVAL HISTORY: Veronica [name] returns today for follow up of her estrogen receptor positive breast cancer.  Since her last visit, results from her genetic testing came back negative with a variance of uncertain significance to MSH6.  Mammaprint testing on Veronica original biopsy came back as high risk.  She proceeded to right breast lumpectomy on 06/12/2019 under Dr. Brantley [name].  This showed ([id number]) and invasive ductal carcinoma, grade 2, measuring 2.4 cm with positive medial and posterior margins.  A total of 4 lymph nodes were obtained, 1 of which was positive with extranodal extension, 1 of which showed isolated tumor cells.  Margins were positive and she is being scheduled for additional surgery for margin clearance.  She is here today to discuss those results and particularly Veronica need for systemic chemotherapy.  REVIEW OF SYSTEMS: Veronica [name] did well with her initial surgery, with very little pain.  She is no longer taking narcotics.  She had no bleeding or fever issues.  She and her family are taking appropriate pandemic precautions.  Detailed review of systems today was otherwise entirely stable   HISTORY OF CURRENT ILLNESS: From Veronica original intake note:  Veronica [name] had routine screening mammography on 04/27/2019 showing a possible abnormality in Veronica right breast. She underwent  right diagnostic mammography with tomography and right breast ultrasonography at [name] on 05/01/2019 showing: breast density category C; highly suspicious 1.7 cm irregular mass in Veronica right breast at 10 o'clock, 8 cm from Veronica nipple; indeterminate hypoechoic round mass in Veronica right axillary tail/low axilla. Physical exam showed no suspicious lumps.  Accordingly on 05/02/2019 she proceeded to biopsy of Veronica right breast area in question. Veronica pathology from this procedure ([id number]) showed: invasive ductal carcinoma, grade 3; ductal carcinoma in situ; lymphovascular invasion present. Prognostic indicators significant for: estrogen receptor, 30% positive with moderate staining intensity, and progesterone receptor, 30% positive with strong staining intensity. Proliferation marker Ki67 at 20%. HER2 negative by immunohistochemistry (1+).  Veronica second "satellite" lesion was a lymph node which was also positive.  Veronica patient's subsequent history is as detailed below.   PAST MEDICAL HISTORY: Past Medical History:  Diagnosis Date   Cancer (Veronica [name])    Chronic low back pain with left-sided sciatica    Family history of leukemia    Family history of stomach cancer    Hypertension     PAST SURGICAL HISTORY: Past Surgical History:  Procedure Laterality Date   BREAST BIOPSY Right 05/02/2019   right clips X2   BREAST LUMPECTOMY WITH RADIOACTIVE SEED AND SENTINEL LYMPH NODE BIOPSY Right 06/12/2019   Procedure: RIGHT BREAST RADIOACTIVE SEED LUMPECTOMY  AND RIGHT AXILLARY SEED TARGETED LYMPH NODE AND AXILLARY SENTINEL LYMPH NODE MAPPING;  Surgeon: Veronica Luna, MD;  Location: [name];  Service: General;  Laterality: Right;  PEC BLOCK   C/S x2  '98 '03   CESAREAN SECTION     CHOLECYSTECTOMY  LAPAROSCOPIC ASSISTED VAGINAL HYSTERECTOMY  05/17/2011   Procedure: LAPAROSCOPIC ASSISTED VAGINAL HYSTERECTOMY;  Surgeon: Veronica [name];  Location: [facility];  Service: Gynecology;   Laterality: N/A;  Laparoscopic Assisted Vaginal Hysterectomy With Lysis Of Adhesions   PORTACATH PLACEMENT Right 06/12/2019   Procedure: INSERTION PORT-A-CATH WITH ULTRASOUND;  Surgeon: Veronica Luna, MD;  Location: [name];  Service: General;  Laterality: Right;   TUBAL LIGATION      FAMILY HISTORY: Family History  Problem Relation Age of Onset   Arthritis Mother    COPD Mother    Hypertension Mother    Hyperlipidemia Mother    Leukemia Brother 37   Stomach cancer Maternal Grandmother        late 47s   Patient's parents are living (as of September 2020). Her father is 73 and her mother is 39 as of 04/2019. Veronica patient denies a family hx of breast or ovarian cancer. She has 7 siblings, 6 brothers and 1 sister. She reports her maternal grandmother was diagnosed with stomach cancer at age 50. Her brother was diagnosed with leukemia at age 41.   GYNECOLOGIC HISTORY:  No LMP recorded. Patient has had a hysterectomy. Menarche: 48 years old Age at first live birth: 48 years old GX P 2 (+1 stillborn) LMP age [age] Contraceptive: unsure, maybe for a year at age 40. HRT no Hysterectomy? Yes, 05/2011 BSO? 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 She is married. Husband Sheppard Plumber") is a Hydrologist for a [name] company. She lives at home with her husband and two daughters. Daughter Suszanne Conners, age 45, is a Scientist, water quality. Daughter Jarrett Soho, age 7, is a Ship broker.     ADVANCED DIRECTIVES: Husband Heath Lark is her HCPOA.   HEALTH MAINTENANCE: Social History   Tobacco Use   Smoking status: Never Smoker   Smokeless tobacco: Never Used  Substance Use Topics   Alcohol use: Yes    Alcohol/week: 2.0 standard drinks    Types: 2 Standard drinks or equivalent per week    Comment: "2-3 a week"   Drug use: No     Colonoscopy: n/a  PAP: 02/2018  Bone density: n/a   No Known Allergies  Current Outpatient Medications    Medication Sig Dispense Refill   Cholecalciferol (VITAMIN D3) 25 MCG (1000 UT) CHEW Chew by mouth.     dexamethasone (DECADRON) 4 MG tablet Take 2 tablets by mouth once a day on Veronica day after chemotherapy and then take 2 tablets two times a day for 2 days. Take with food. 30 tablet 1   ibuprofen (ADVIL) 800 MG tablet Take 1 tablet (800 mg total) by mouth every 8 (eight) hours as needed. 30 tablet 0   lidocaine-prilocaine (EMLA) cream Apply to affected area once 30 g 3   LORazepam (ATIVAN) 0.5 MG tablet Take 1 tablet (0.5 mg total) by mouth at bedtime as needed (Nausea or vomiting). 30 tablet 0   olmesartan-hydrochlorothiazide (BENICAR HCT) 20-12.5 MG tablet Take 1 tablet by mouth daily.     oxyCODONE (OXY IR/ROXICODONE) 5 MG immediate release tablet Take 1 tablet (5 mg total) by mouth every 6 (six) hours as needed for severe pain. 15 tablet 0   prochlorperazine (COMPAZINE) 10 MG tablet Take 1 tablet (10 mg total) by mouth every 6 (six) hours as needed (Nausea or vomiting). 30 tablet 1   No current facility-administered medications for this visit.     OBJECTIVE: Morbidly obese white woman who appears  stated age  79:   06/21/19 1319  BP: 110/61  Pulse: 86  Resp: 18  Temp: 98 F (36.7 C)  SpO2: 100%     Body mass index is 42.51 kg/m.   Wt Readings from Last 3 Encounters:  06/21/19 225 lb (102.1 kg)  06/12/19 223 lb 1.7 oz (101.2 kg)  03/18/12 188 lb (85.3 kg)      ECOG FS:1 - Symptomatic but completely ambulatory  Sclerae unicteric, EOMs intact Wearing a mask No cervical or supraclavicular adenopathy Lungs no rales or rhonchi Heart regular rate and rhythm Abd soft, nontender, positive bowel sounds MSK no focal spinal tenderness, no upper extremity lymphedema Neuro: nonfocal, well oriented, appropriate affect Breasts: Veronica right breast incision is healing nicely, without erythema, swelling, or dehiscence.  LAB RESULTS:  CMP     Component Value Date/Time   NA  137 06/08/2019 1212   K 4.2 06/08/2019 1212   CL 107 06/08/2019 1212   CO2 21 (L) 06/08/2019 1212   GLUCOSE 106 (H) 06/08/2019 1212   BUN 8 06/08/2019 1212   CREATININE 0.82 06/08/2019 1212   CREATININE 0.83 05/09/2019 1225   CALCIUM 9.2 06/08/2019 1212   PROT 6.7 06/08/2019 1212   ALBUMIN 3.7 06/08/2019 1212   AST 25 06/08/2019 1212   AST 18 05/09/2019 1225   ALT 39 06/08/2019 1212   ALT 23 05/09/2019 1225   ALKPHOS 48 06/08/2019 1212   BILITOT 1.0 06/08/2019 1212   BILITOT 0.7 05/09/2019 1225   GFRNONAA >60 06/08/2019 1212   GFRNONAA >60 05/09/2019 1225   GFRAA >60 06/08/2019 1212   GFRAA >60 05/09/2019 1225    No results found for: TOTALPROTELP, ALBUMINELP, A1GS, A2GS, BETS, BETA2SER, GAMS, MSPIKE, SPEI  No results found for: KPAFRELGTCHN, LAMBDASER, KAPLAMBRATIO  Lab Results  Component Value Date   WBC 7.7 06/08/2019   NEUTROABS 4.9 06/08/2019   HGB 14.2 06/08/2019   HCT 42.5 06/08/2019   MCV 88.2 06/08/2019   PLT 227 06/08/2019    _0 @  No results found for: LABCA2  No components found for: EUMPNT614  No results for input(s): INR in Veronica last 168 hours.  No results found for: LABCA2  No results found for: ERX540  No results found for: GQQ761  No results found for: PJK932  No results found for: CA2729  No components found for: HGQUANT  No results found for: CEA1 / No results found for: CEA1   No results found for: AFPTUMOR  No results found for: CHROMOGRNA  No results found for: PSA1  No visits with results within 3 Day(s) from this visit.  Latest known visit with results is:  Admission on 06/12/2019, Discharged on 06/12/2019  Component Date Value Ref Range Status   WBC 06/08/2019 7.7  4.0 - 10.5 K/uL Final   RBC 06/08/2019 4.82  3.87 - 5.11 MIL/uL Final   Hemoglobin 06/08/2019 14.2  12.0 - 15.0 g/dL Final   HCT 06/08/2019 42.5  36.0 - 46.0 % Final   MCV 06/08/2019 88.2  80.0 - 100.0 fL Final   MCH 06/08/2019 29.5  26.0 -  34.0 pg Final   MCHC 06/08/2019 33.4  30.0 - 36.0 g/dL Final   RDW 06/08/2019 11.9  11.5 - 15.5 % Final   Platelets 06/08/2019 227  150 - 400 K/uL Final   nRBC 06/08/2019 0.0  0.0 - 0.2 % Final   Neutrophils Relative % 06/08/2019 63  % Final   Neutro Abs 06/08/2019 4.9  1.7 - 7.7 K/uL Final  Lymphocytes Relative 06/08/2019 26  % Final   Lymphs Abs 06/08/2019 2.0  0.7 - 4.0 K/uL Final   Monocytes Relative 06/08/2019 8  % Final   Monocytes Absolute 06/08/2019 0.6  0.1 - 1.0 K/uL Final   Eosinophils Relative 06/08/2019 2  % Final   Eosinophils Absolute 06/08/2019 0.1  0.0 - 0.5 K/uL Final   Basophils Relative 06/08/2019 0  % Final   Basophils Absolute 06/08/2019 0.0  0.0 - 0.1 K/uL Final   Immature Granulocytes 06/08/2019 1  % Final   Abs Immature Granulocytes 06/08/2019 0.04  0.00 - 0.07 K/uL Final   Performed at [facility] [address], [place], [place] [address]   Sodium 06/08/2019 137  135 - 145 mmol/L Final   Potassium 06/08/2019 4.2  3.5 - 5.1 mmol/L Final   Chloride 06/08/2019 107  98 - 111 mmol/L Final   CO2 06/08/2019 21* 22 - 32 mmol/L Final   Glucose, Bld 06/08/2019 106* 70 - 99 mg/dL Final   BUN 06/08/2019 8  6 - 20 mg/dL Final   Creatinine, Ser 06/08/2019 0.82  0.44 - 1.00 mg/dL Final   Calcium 06/08/2019 9.2  8.9 - 10.3 mg/dL Final   Total Protein 06/08/2019 6.7  6.5 - 8.1 g/dL Final   Albumin 06/08/2019 3.7  3.5 - 5.0 g/dL Final   AST 06/08/2019 25  15 - 41 U/L Final   ALT 06/08/2019 39  0 - 44 U/L Final   Alkaline Phosphatase 06/08/2019 48  38 - 126 U/L Final   Total Bilirubin 06/08/2019 1.0  0.3 - 1.2 mg/dL Final   GFR calc non Af Amer 06/08/2019 >60  >60 mL/min Final   GFR calc Af Amer 06/08/2019 >60  >60 mL/min Final   Anion gap 06/08/2019 9  5 - 15 Final   Performed at [facility], [place] [address], [place], [place] [address]    (this displays Veronica last labs from Veronica last 3 days)  No results found for:  TOTALPROTELP, ALBUMINELP, A1GS, A2GS, BETS, BETA2SER, GAMS, MSPIKE, SPEI (this displays SPEP labs)  No results found for: KPAFRELGTCHN, LAMBDASER, KAPLAMBRATIO (kappa/lambda light chains)  No results found for: HGBA, HGBA2QUANT, HGBFQUANT, HGBSQUAN (Hemoglobinopathy evaluation)   No results found for: LDH  No results found for: IRON, TIBC, IRONPCTSAT (Iron and TIBC)  No results found for: FERRITIN  Urinalysis No results found for: COLORURINE, APPEARANCEUR, LABSPEC, PHURINE, GLUCOSEU, HGBUR, BILIRUBINUR, KETONESUR, PROTEINUR, UROBILINOGEN, NITRITE, LEUKOCYTESUR   STUDIES: Nm Sentinel Node Inj-no Rpt (melanoma)  Result Date: 06/12/2019 Sulfur colloid was injected by Veronica nuclear medicine technologist for melanoma sentinel node.   Mm Breast Surgical Specimen  Result Date: 06/12/2019 CLINICAL DATA:  Status post radioactive seed localized RIGHT lumpectomy EXAM: SPECIMEN RADIOGRAPH OF Veronica RIGHT BREAST COMPARISON:  Previous exam(s). FINDINGS: Status post excision of Veronica right breast. Veronica radioactive seed and ribbon shaped biopsy marker clip are present, completely intact, and were marked for pathology. IMPRESSION: Specimen radiograph of Veronica right breast. Electronically Signed   By: Nolon Nations M.D.   On: 06/12/2019 11:54   Mm Breast Surgical Specimen  Result Date: 06/12/2019 CLINICAL DATA:  Status post seed localized excision of metastatic lymph node in Veronica 10 o'clock location of Veronica RIGHT breast 12 centimeters from Veronica nipple at Veronica site of a coil shaped clip. EXAM: SPECIMEN RADIOGRAPH OF Veronica RIGHT BREAST COMPARISON:  Previous exam(s). FINDINGS: Status post excision of Veronica right breast. Veronica radioactive seed and coil shaped biopsy marker clip are  present, completely intact, and were marked for pathology. IMPRESSION: Specimen radiograph of Veronica right breast. Electronically Signed   By: Nolon Nations M.D.   On: 06/12/2019 11:40   Dg Chest Port 1 View  Result Date: 06/12/2019 CLINICAL  DATA:  Port-A-Cath placement EXAM: PORTABLE CHEST 1 VIEW COMPARISON:  None. FINDINGS: Right chest port catheter placement with tip position near Veronica superior cavoatrial junction. Veronica heart size and mediastinal contours are within normal limits. Both lungs are clear. Veronica visualized skeletal structures are unremarkable. Surgical clips and subcutaneous air lucency in Veronica right breast. IMPRESSION: Right chest port catheter placement with tip position near Veronica superior cavoatrial junction. No acute abnormality of Veronica lungs. Electronically Signed   By: Eddie Candle M.D.   On: 06/12/2019 13:06   Dg Fluoro Guide Cv Line-no Report  Result Date: 06/12/2019 Fluoroscopy was utilized by Veronica requesting physician.  No radiographic interpretation.   Mm Rt Radioactive Seed Loc Mammo Guide  Result Date: 06/11/2019 CLINICAL DATA:  Recently diagnosed invasive ductal carcinoma and ductal carcinoma in situ in Veronica 10 o'clock position of Veronica right breast, 8 cm from Veronica nipple and metastatic lymph node in Veronica 10 o'clock position of Veronica right breast, 12 cm from Veronica nipple. EXAM: MAMMOGRAPHIC GUIDED RADIOACTIVE SEED LOCALIZATION OF Veronica RIGHT BREAST ULTRASOUND GUIDED RADIOACTIVE SEED LOCALIZATION OF Veronica RIGHT BREAST COMPARISON:  Previous exam(s). FINDINGS: Patient presents for radioactive seed localization prior to right lumpectomy. I met with Veronica patient and we discussed Veronica procedure of seed localization including benefits and alternatives. We discussed Veronica high likelihood of a successful procedure. We discussed Veronica risks of Veronica procedure including infection, bleeding, tissue injury and further surgery. We discussed Veronica low dose of radioactivity involved in Veronica procedure. Informed, written consent was given. Veronica usual time-out protocol was performed immediately prior to Veronica procedures. SITE 1: RIBBON SHAPED BIOPSY MARKER CLIP IN INVASIVE DUCTAL CARCINOMA AND DUCTAL CARCINOMA IN SITU IN Veronica 10 O'CLOCK POSITION OF Veronica RIGHT BREAST, 8 CM  FROM Veronica NIPPLE Using mammographic guidance, sterile technique, 1% lidocaine and an I-125 radioactive seed, Veronica ribbon shaped biopsy marker clip in a spiculated mass in Veronica 10 o'clock position of Veronica right breast was localized using a cephalad approach. Veronica follow-up mammogram images confirm Veronica seed in Veronica expected location and were marked for Dr. Brantley [name]. Follow-up survey of Veronica patient confirms presence of Veronica radioactive seed. Order number of I-125 seed:  [id number]. Total activity:  0.253 mCi reference Date: 05/29/2019 SITE 2: COIL SHAPED BIOPSY MARKER CLIP IN Veronica METASTATIC LYMPH NODE IN 10 O'CLOCK POSITION OF Veronica RIGHT BREAST, 12 CM FROM Veronica NIPPLE Using ultrasound guidance, sterile technique, 1% lidocaine and an I-125 radioactive seed, Veronica biopsied mass containing a coil shaped biopsy marker clip in Veronica 10 o'clock position of Veronica right breast, 12 cm from Veronica nipple, was localized using a caudal approach. Veronica follow-up mammogram images confirm Veronica seed in Veronica expected location and were marked for Dr. Brantley [name]. Follow-up survey of Veronica patient confirms presence of Veronica radioactive seed. Order number of I-125 seed:  [id number]. Total activity:  0.253 mCi reference Date: 05/29/2019 Veronica patient tolerated Veronica procedure well and was released from Veronica [place]. She was given instructions regarding seed removal. IMPRESSION: Radioactive seed localization right breast x 2. No apparent complications. Electronically Signed   By: Claudie Revering M.D.   On: 06/11/2019 14:35   [place] Rt Radioactive Seed Ea Add Lesion  Result Date: 06/11/2019 CLINICAL DATA:  Recently diagnosed invasive  ductal carcinoma and ductal carcinoma in situ in Veronica 10 o'clock position of Veronica right breast, 8 cm from Veronica nipple and metastatic lymph node in Veronica 10 o'clock position of Veronica right breast, 12 cm from Veronica nipple. EXAM: MAMMOGRAPHIC GUIDED RADIOACTIVE SEED LOCALIZATION OF Veronica RIGHT BREAST ULTRASOUND GUIDED RADIOACTIVE SEED LOCALIZATION OF Veronica RIGHT  BREAST COMPARISON:  Previous exam(s). FINDINGS: Patient presents for radioactive seed localization prior to right lumpectomy. I met with Veronica patient and we discussed Veronica procedure of seed localization including benefits and alternatives. We discussed Veronica high likelihood of a successful procedure. We discussed Veronica risks of Veronica procedure including infection, bleeding, tissue injury and further surgery. We discussed Veronica low dose of radioactivity involved in Veronica procedure. Informed, written consent was given. Veronica usual time-out protocol was performed immediately prior to Veronica procedures. SITE 1: RIBBON SHAPED BIOPSY MARKER CLIP IN INVASIVE DUCTAL CARCINOMA AND DUCTAL CARCINOMA IN SITU IN Veronica 10 O'CLOCK POSITION OF Veronica RIGHT BREAST, 8 CM FROM Veronica NIPPLE Using mammographic guidance, sterile technique, 1% lidocaine and an I-125 radioactive seed, Veronica ribbon shaped biopsy marker clip in a spiculated mass in Veronica 10 o'clock position of Veronica right breast was localized using a cephalad approach. Veronica follow-up mammogram images confirm Veronica seed in Veronica expected location and were marked for Dr. Brantley [name]. Follow-up survey of Veronica patient confirms presence of Veronica radioactive seed. Order number of I-125 seed:  [id number]. Total activity:  0.253 mCi reference Date: 05/29/2019 SITE 2: COIL SHAPED BIOPSY MARKER CLIP IN Veronica METASTATIC LYMPH NODE IN 10 O'CLOCK POSITION OF Veronica RIGHT BREAST, 12 CM FROM Veronica NIPPLE Using ultrasound guidance, sterile technique, 1% lidocaine and an I-125 radioactive seed, Veronica biopsied mass containing a coil shaped biopsy marker clip in Veronica 10 o'clock position of Veronica right breast, 12 cm from Veronica nipple, was localized using a caudal approach. Veronica follow-up mammogram images confirm Veronica seed in Veronica expected location and were marked for Dr. Brantley [name]. Follow-up survey of Veronica patient confirms presence of Veronica radioactive seed. Order number of I-125 seed:  [id number]. Total activity:  0.253 mCi reference Date: 05/29/2019 Veronica  patient tolerated Veronica procedure well and was released from Veronica [place]. She was given instructions regarding seed removal. IMPRESSION: Radioactive seed localization right breast x 2. No apparent complications. Electronically Signed   By: Claudie Revering M.D.   On: 06/11/2019 14:35    ELIGIBLE FOR AVAILABLE RESEARCH PROTOCOL: no  ASSESSMENT: 48 y.o. [place], [place] woman status post right breast upper outer quadrant biopsy 05/01/2019 for a clinical T1c N1, [name] IIA invasive ductal carcinoma, grade 3, estrogen and progesterone receptor positive, HER-2 nonamplified, with an MIB-1-1 of 20%.  (a) mass in Veronica axillary tail was a positive lymph node  (1) MammaPrint obtained from Veronica original biopsy shows a high risk luminal subtype B tumor  (2) genetics testing 05/09/2019 through Veronica Common Hereditary Gene Panel offered by [facility] found no deleterious mutations in APC, ATM, AXIN2, BARD1, BMPR1A, BRCA1, BRCA2, BRIP1, CDH1, CDK4, CDKN2A (p14ARF), CDKN2A (p16INK4a), CHEK2, CTNNA1, DICER1, EPCAM (Deletion/duplication testing only), GREM1 (promoter region deletion/duplication testing only), KIT, MEN1, MLH1, MSH2, MSH3, MSH6, MUTYH, NBN, NF1, NHTL1, PALB2, PDGFRA, PMS2, POLD1, POLE, PTEN, RAD50, RAD51C, RAD51D, RNF43, SDHB, SDHC, SDHD, SMAD4, SMARCA4. STK11, TP53, TSC1, TSC2, and VHL.  Veronica following genes were evaluated for sequence changes only: SDHA and HOXB13 c.251G>A variant only.   (a) A variant of uncertain significance (VUS) was detected in one of her MSH6 genes (c.831A>C).  (3) status post  right lumpectomy and sentinel lymph node sampling 06/12/2019 for a pT2 pN1, [name] IIA invasive ductal carcinoma, grade 2, with positive margins  (a) a total of 4 sentinel lymph nodes removed, one positive (with ECE), ine itc  (b) margin clearance  (4) adjuvant chemotherapy will consist of doxorubicin and cyclophosphamide in dose dense fashion x4 starting 07/10/2019 followed by weekly paclitaxel x12  (5) adjuvant  radiation to follow  (5) antiestrogens to start at Veronica completion of local treatment  PLAN: [NB Veronica following comments have been corrected. Please discard Veronica earlier version of this note if you received a copy.]  Adiba did very well with her initial surgery.   She already has a port in place.  We went over her MammaPrint which gives her a 5-year disease-free survival estimate of 93% with chemotherapy as well as antiestrogens.  She understands Veronica absolute benefit of chemotherapy is in excess of 10%  Today we discussed ACE-T chemotherapy in detail.  She has a good understanding of Veronica possible toxicities, side effects and complications including issues regarding congestive heart failure and immunocompromise.  I have entered her orders and she will also come to meet with our chemotherapy teaching nurse on 07/04/2019 to reinforce some of these lessons.  We are hoping to start Veronica chemotherapy 07/10/2019 assuming she has recovered from her axillary lymph node dissection by then  We are going to see her day 8 from cycle 1 and then day 1 on Veronica subsequent cycles or as needed.  She knows to call for any other issues that may develop before her next visit here.   Veronica Cruel, MD   06/21/2019 2:20 PM Medical Oncology and Hematology [facility] [address] [place], [place] [address] Tel. [phone]    Fax. [phone]   This document serves as a record of services personally performed by Lurline Del, MD. It was created on his behalf by Wilburn Mylar, a trained medical scribe. Veronica creation of this record is based on Veronica scribe's personal observations and Veronica provider's statements to them.   I, Lurline Del MD, have reviewed Veronica above documentation for accuracy and completeness, and I agree with Veronica above.

## 2019-06-21 ENCOUNTER — Other Ambulatory Visit: Payer: Self-pay

## 2019-06-21 ENCOUNTER — Ambulatory Visit: Payer: Self-pay | Admitting: Surgery

## 2019-06-21 ENCOUNTER — Inpatient Hospital Stay: Payer: 59 | Attending: Oncology | Admitting: Oncology

## 2019-06-21 VITALS — BP 110/61 | HR 86 | Temp 98.0°F | Resp 18 | Ht 61.0 in | Wt 225.0 lb

## 2019-06-21 DIAGNOSIS — Z1379 Encounter for other screening for genetic and chromosomal anomalies: Secondary | ICD-10-CM

## 2019-06-21 DIAGNOSIS — C50411 Malignant neoplasm of upper-outer quadrant of right female breast: Secondary | ICD-10-CM

## 2019-06-21 DIAGNOSIS — Z170 Estrogen receptor positive status [ER+]: Secondary | ICD-10-CM | POA: Diagnosis not present

## 2019-06-21 DIAGNOSIS — C773 Secondary and unspecified malignant neoplasm of axilla and upper limb lymph nodes: Secondary | ICD-10-CM | POA: Diagnosis not present

## 2019-06-21 MED ORDER — LORAZEPAM 0.5 MG PO TABS: 0.5000 mg | ORAL_TABLET | Freq: Every evening | ORAL | 0 refills | Status: DC | PRN

## 2019-06-21 MED ORDER — PROCHLORPERAZINE MALEATE 10 MG PO TABS: 10.0000 mg | ORAL_TABLET | Freq: Four times a day (QID) | ORAL | 1 refills | Status: DC | PRN

## 2019-06-21 MED ORDER — LIDOCAINE-PRILOCAINE 2.5-2.5 % EX CREA: TOPICAL_CREAM | CUTANEOUS | 3 refills | Status: DC

## 2019-06-21 MED ORDER — DEXAMETHASONE 4 MG PO TABS: ORAL_TABLET | ORAL | 1 refills | Status: DC

## 2019-06-21 NOTE — Progress Notes (Signed)
START ON PATHWAY REGIMEN - Breast   Dose-Dense AC q14 days:   A cycle is every 14 days:     Doxorubicin      Cyclophosphamide      Pegfilgrastim-xxxx   **Always confirm dose/schedule in your pharmacy ordering system**  Paclitaxel 80 mg/m2 Weekly:   Administer weekly:     Paclitaxel   **Always confirm dose/schedule in your pharmacy ordering system**  Patient Characteristics: Postoperative without Neoadjuvant Therapy (Pathologic Staging), Invasive Disease, Adjuvant Therapy, HER2 Negative/Unknown/Equivocal, ER Positive, Node Positive, Node Positive (1-3), MammaPrint(R) Ordered, High Genomic Risk Therapeutic Status: Postoperative without Neoadjuvant Therapy (Pathologic Staging) AJCC Grade: GX AJCC N Category: pNX AJCC M Category: cM0 ER Status: Positive (+) AJCC 8 Stage Grouping: IIA HER2 Status: Negative (-) Oncotype Dx Recurrence Score: Ordered Other Genomic Test AJCC T Category: pTX PR Status: Positive (+) Has this patient completed genomic testing<= Yes - MammaPrint(R) MammaPrint(R) Score: High Genomic Risk Intent of Therapy: Curative Intent, Discussed with Patient

## 2019-06-22 ENCOUNTER — Telehealth: Payer: Self-pay | Admitting: Oncology

## 2019-06-22 ENCOUNTER — Ambulatory Visit: Payer: 59 | Admitting: Oncology

## 2019-06-22 NOTE — Telephone Encounter (Signed)
I talk with patient regarding schedule  

## 2019-06-26 ENCOUNTER — Ambulatory Visit (HOSPITAL_COMMUNITY): Payer: 59 | Attending: Cardiology

## 2019-06-26 ENCOUNTER — Other Ambulatory Visit: Payer: Self-pay

## 2019-06-26 DIAGNOSIS — Z170 Estrogen receptor positive status [ER+]: Secondary | ICD-10-CM

## 2019-06-26 DIAGNOSIS — C50411 Malignant neoplasm of upper-outer quadrant of right female breast: Secondary | ICD-10-CM

## 2019-06-29 ENCOUNTER — Other Ambulatory Visit: Payer: Self-pay | Admitting: Oncology

## 2019-06-29 NOTE — Progress Notes (Signed)
Veronica [name]'s surgery for margin clearance is October 6.  Accordingly we are moving her first treatment to October 13

## 2019-07-03 ENCOUNTER — Ambulatory Visit: Payer: 59 | Admitting: Adult Health

## 2019-07-04 ENCOUNTER — Other Ambulatory Visit: Payer: Self-pay

## 2019-07-04 ENCOUNTER — Inpatient Hospital Stay: Payer: 59

## 2019-07-05 ENCOUNTER — Encounter: Payer: Self-pay | Admitting: Physical Therapy

## 2019-07-05 ENCOUNTER — Ambulatory Visit: Payer: 59 | Attending: Surgery | Admitting: Physical Therapy

## 2019-07-05 DIAGNOSIS — Z170 Estrogen receptor positive status [ER+]: Secondary | ICD-10-CM

## 2019-07-05 DIAGNOSIS — R293 Abnormal posture: Secondary | ICD-10-CM

## 2019-07-05 DIAGNOSIS — C50411 Malignant neoplasm of upper-outer quadrant of right female breast: Secondary | ICD-10-CM | POA: Insufficient documentation

## 2019-07-05 DIAGNOSIS — Z483 Aftercare following surgery for neoplasm: Secondary | ICD-10-CM

## 2019-07-05 DIAGNOSIS — M25611 Stiffness of right shoulder, not elsewhere classified: Secondary | ICD-10-CM | POA: Diagnosis present

## 2019-07-05 NOTE — Therapy (Signed)
[place], [place], [address] Phone: [phone]   Fax:  [phone]  Physical Therapy Treatment  Patient Details  Name: Veronica [name] MRN: [id number] Date of Birth: Nov 18, 1970 Referring Provider (PT): Dr. Erroll Luna   Encounter Date: 07/05/2019  PT End of Session - 07/05/19 1037    Visit Number  2    Number of Visits  2    PT Start Time  1006    PT Stop Time  1038    PT Time Calculation (min)  32 min    Activity Tolerance  Patient tolerated treatment well    Behavior During Therapy  [facility] for tasks assessed/performed       Past Medical History:  Diagnosis Date  . Cancer ([place])   . Chronic low back pain with left-sided sciatica   . Family history of leukemia   . Family history of stomach cancer   . Hypertension     Past Surgical History:  Procedure Laterality Date  . BREAST BIOPSY Right 05/02/2019   right clips X2  . BREAST LUMPECTOMY WITH RADIOACTIVE SEED AND SENTINEL LYMPH NODE BIOPSY Right 06/12/2019   Procedure: RIGHT BREAST RADIOACTIVE SEED LUMPECTOMY  AND RIGHT AXILLARY SEED TARGETED LYMPH NODE AND AXILLARY SENTINEL LYMPH NODE MAPPING;  Surgeon: Erroll Luna, MD;  Location: [place];  Service: General;  Laterality: Right;  PEC BLOCK  . C/S x2  '98 '03  . CESAREAN SECTION    . CHOLECYSTECTOMY    . LAPAROSCOPIC ASSISTED VAGINAL HYSTERECTOMY  05/17/2011   Procedure: LAPAROSCOPIC ASSISTED VAGINAL HYSTERECTOMY;  Surgeon: Sharene Butters;  Location: [facility];  Service: Gynecology;  Laterality: N/A;  Laparoscopic Assisted Vaginal Hysterectomy With Lysis Of Adhesions  . PORTACATH PLACEMENT Right 06/12/2019   Procedure: INSERTION PORT-A-CATH WITH ULTRASOUND;  Surgeon: Erroll Luna, MD;  Location: [place];  Service: General;  Laterality: Right;  . TUBAL LIGATION      There were no vitals filed for this visit.  Subjective Assessment - 07/05/19 1009    Subjective  Patient underwent a right lumpectomy and sentinel node biopsy on 06/12/2019 with 1/4 axillary lymph nodes positive. She will have a re-excision on 07/17/2019 to obtain clear margins. She will begin chemotherapy on 07/24/2019 followed by radiaiton to include right axilla and then anti-estrogen therapy.    Pertinent History  Patient was diagnosed on 04/26/2019 with right grade III invasive ductal carcinoma breast cancer. It is ER/PR positive and HER2 negative with a Ki67 of 20%. Patient underwent a right lumpectomy and sentinel node biopsy on 06/12/2019 with 1/4 axillary lymph nodes positive.    Patient Stated Goals  See if my arm is ok    Currently in Pain?  Yes    Pain Score  3     Pain Location  Axilla    Pain Orientation  Right    Pain Descriptors / Indicators  Aching;Dull;Burning    Pain Type  Surgical pain;Neuropathic pain    Pain Onset  1 to 4 weeks ago    Pain Frequency  Intermittent    Aggravating Factors   Laying on right side    Pain Relieving Factors  Unknown    Multiple Pain Sites  No         [facility] PT Assessment - 07/05/19 0001      Assessment   Medical Diagnosis  s/p right lumpectomy and SLNB    Referring Provider (PT)  Dr. Erroll Luna  Onset Date/Surgical Date  06/12/19    Hand Dominance  Right    Prior Therapy  Baselines      Precautions   Precautions  Other (comment)    Precaution Comments  recent surgery      Restrictions   Weight Bearing Restrictions  No      Balance Screen   Has the patient fallen in the past 6 months  No    Has the patient had a decrease in activity level because of a fear of falling?   No    Is the patient reluctant to leave their home because of a fear of falling?   No      Home Social worker  Private residence    Living Arrangements  Spouse/significant other;Children   Husband, 48 and 31 y.o. daughters   Available Help at Discharge  Family      Prior Function   Level of Independence  Independent     Vocation  Unemployed    Vocation Requirements  Babysits sometimes    Leisure  She is walking about an hour 6x/week      Cognition   Overall Cognitive Status  Within Functional Limits for tasks assessed      Observation/Other Assessments   Observations  Incision appears to be healing well with small slight opening at medial end of incision. Good scar mobility. pot placement site is well healed.      Posture/Postural Control   Posture/Postural Control  Postural limitations    Postural Limitations  Rounded Shoulders;Forward head      ROM / Strength   AROM / PROM / Strength  AROM      AROM   AROM Assessment Site  Shoulder    Right/Left Shoulder  Right    Right Shoulder Extension  44 Degrees    Right Shoulder Flexion  149 Degrees    Right Shoulder ABduction  135 Degrees    Right Shoulder Internal Rotation  58 Degrees    Right Shoulder External Rotation  87 Degrees      Palpation   Palpation comment  Tender to palpation right medial upper arm with positive neural tension test.        LYMPHEDEMA/ONCOLOGY QUESTIONNAIRE - 07/05/19 1016      Type   Cancer Type  Right breast cancer      Surgeries   Lumpectomy Date  06/12/19    Sentinel Lymph Node Biopsy Date  06/12/19    Number Lymph Nodes Removed  4      Treatment   Active Chemotherapy Treatment  Yes    Date  07/24/19    Past Chemotherapy Treatment  No    Active Radiation Treatment  No    Past Radiation Treatment  No    Current Hormone Treatment  No    Past Hormone Therapy  No      What other symptoms do you have   Are you Having Heaviness or Tightness  No    Are you having Pain  Yes    Are you having pitting edema  No    Is it Hard or Difficult finding clothes that fit  No    Do you have infections  No    Is there Decreased scar mobility  No    Stemmer Sign  No      Lymphedema Assessments   Lymphedema Assessments  Upper extremities      Right Upper Extremity Lymphedema   10 cm Proximal to  Olecranon Process  33.8  cm    Olecranon Process  27 cm    10 cm Proximal to Ulnar Styloid Process  25.8 cm    Just Proximal to Ulnar Styloid Process  18.6 cm    Across Hand at [employer]  20 cm    At [place] of 2nd Digit  6.6 cm      Left Upper Extremity Lymphedema   10 cm Proximal to Olecranon Process  33.5 cm    Olecranon Process  26.7 cm    10 cm Proximal to Ulnar Styloid Process  25.2 cm    Just Proximal to Ulnar Styloid Process  18.3 cm    Across Hand at [employer]  19.6 cm    At [place] of 2nd Digit  6.8 cm        Quick Dash - 07/05/19 0001    Open a tight or new jar  Mild difficulty    Do heavy household chores (wash walls, wash floors)  No difficulty    Carry a shopping bag or briefcase  No difficulty    Wash your back  No difficulty    Use a knife to cut food  No difficulty    Recreational activities in which you take some force or impact through your arm, shoulder, or hand (golf, hammering, tennis)  Mild difficulty    During the past week, to what extent has your arm, shoulder or hand problem interfered with your normal social activities with family, friends, neighbors, or groups?  Not at all    During the past week, to what extent has your arm, shoulder or hand problem limited your work or other regular daily activities  Slightly    Arm, shoulder, or hand pain.  Mild    Tingling (pins and needles) in your arm, shoulder, or hand  Mild    Difficulty Sleeping  Mild difficulty    DASH Score  13.64 %                          PT Long Term Goals - 07/05/19 1051      PT LONG TERM GOAL #1   Title  Patient will demonstrate she has regained full shoulder ROM and function post operatively compared to baselines.    Time  8    Period  Weeks    Status  Partially Met            Plan - 07/05/19 1048    Clinical Impression Statement  Patient is doing very well s/p right lumpectomy and sentinel node biopsy. She had 1/4 positive lymph nodes. She will have a re-excision on  07/17/2019 and begin chemo on 07/24/2019 followed by radiation and anti-estrogen therapy. She is doing very well and has regained nearly all shoulder ROM and her DASH score was equal to baseline indicating she has regained upper extremity function. She was lacking 20 degrees of abduction due to neural tension but was given exercises to work on to regain the last few degrees. She participated in the After Breast Cancer class on 07/02/2019 and reports being well educated on lymphedema risk reduction. There is no further need for PT at this time.    PT Next Visit Plan  D/C    PT Home Exercise Plan  Post op shoulder ROM HEP and neural stretch    Consulted and Agree with Plan of Care  Patient       Patient will  benefit from skilled therapeutic intervention in order to improve the following deficits and impairments:  Decreased knowledge of precautions, Pain, Impaired UE functional use, Postural dysfunction, Decreased range of motion  Visit Diagnosis: Malignant neoplasm of upper-outer quadrant of right breast in female, estrogen receptor positive (HCC)  Abnormal posture  Aftercare following surgery for neoplasm  Stiffness of right shoulder, not elsewhere classified     Problem List Patient Active Problem List   Diagnosis Date Noted  . Genetic testing 05/17/2019  . Family history of leukemia   . Family history of stomach cancer   . Malignant neoplasm of upper-outer quadrant of right breast in female, estrogen receptor positive ([place]) 05/07/2019  . S/P laparoscopic assisted vaginal hysterectomy ([facility]) 05/17/2011    Class: Chronic  . FOOT PAIN, RIGHT 07/11/2010   PHYSICAL THERAPY DISCHARGE SUMMARY  Visits from Start of Care: 2  Current functional level related to goals / functional outcomes: See above for objective findings  Remaining deficits: Mild limitation with shoulder abduction and neural tension in right upper extremity.   Education / Equipment: HEP and lymphedema risk reduction  education. Plan: Patient agrees to discharge.  Patient goals were partially met. Patient is being discharged due to being pleased with the current functional level.  ?????         Annia Friendly, [place] 07/05/19 10:53 AM   [place], [place], [address] Phone: [phone]   Fax:  [phone]  Name: DIMONIQUE BOURDEAU MRN: [id number] Date of Birth: 1971/06/21

## 2019-07-05 NOTE — Patient Instructions (Addendum)
Closed Chain: Shoulder Abduction / Adduction - on Wall    One hand on wall, step to side and return. Stepping causes shoulder to abduct and adduct. Step _5_ times, hold 5 seconds, _2-3__ times per day. Cane Exercise: Abduction    Hold cane with right hand over end, palm-up, with other hand palm-down. Move arm out from side and up by pushing with other arm. Hold __5__ seconds. Repeat __5__ times. Do __2-3__ sessions per day.  http://gt2.exer.us/82   Copyright  VHI. All rights reserved.   http://ss.exer.us/267   Copyright  VHI. All rights reserved.  MEDIAN NERVE: Mobilization XI    Stand with right palm flat on wall, fingers back, elbow bent, head tilted away. Sidestep away from wall, straightening elbow. Hold this stretch for 1-2 seconds and then relax turning back to the wall. Repeat this 10 times. Do _1__ set of _10__ repetitions per session. Do _2-3__ sessions per day.  Copyright  VHI. All rights reserved.

## 2019-07-06 ENCOUNTER — Ambulatory Visit: Payer: Self-pay | Admitting: Surgery

## 2019-07-06 NOTE — H&P (Signed)
Veronica [name] Documented: 07/06/2019 10:20 AM Location: [facility] Patient #: [id number] DOB: Oct 15, 1970 Married / Language: Cleophus Molt / Race: White Female  History of Present Illness Marcello Moores A. Akia Montalban MD; 07/06/2019 10:59 AM) Patient words: Patient returns after right breast lumpectomy for stage II right breast cancer. She had 2 positive margins and is scheduled for reexcision on 07/17/2019. She had 1 positive lymph node to the liver negative. A third showed isolated tumor cells. Discussed with oncology lymph node dissection not recommended. Overall, she has no complaints.    Diagnosis 1. Breast, lumpectomy, Right w/seed - INVASIVE DUCTAL CARCINOMA, NOTTINGHAM GRADE 2 OF 3, 2.4 CM - DUCTAL CARCINOMA IN-SITU, INTERMEDIATE NUCLEAR GRADE - CARCINOMA INVOLVES THE MEDIAL AND POSTERIOR MARGINS - LYMPHOVASCULAR SPACE INVASION PRESENT - PREVIOUS BIOPSY SITE CHANGES - SEE ONCOLOGY TABLE AND COMMENT BELOW 2. Lymph node, sentinel, biopsy, Right axillary w/ target seed - METASTATIC CARCINOMA INVOLVING ONE LYMPH NODE WITH EXTRACAPSULAR EXTENSION (1/1) - LYMPHOVASCULAR SPACE INVASION PRESENT - PREVIOUS BIOPSY SITE CHANGES - SEE COMMENT 3. Lymph node, sentinel, biopsy, Right Axillary - ISOLATED TUMOR CELLS INVOLVING ONE LYMPH NODE (0I/1) - SEE COMMENT 4. Lymph node, sentinel, biopsy, Right - NO CARCINOMA IDENTIFIED IN ONE LYMPH NODE (0/1) - SEE COMMENT 5. Lymph node, sentinel, biopsy, Right - NO CARCINOMA IDENTIFIED IN ONE LYMPH NODE (0/1) - SEE COMMENT Microscopic Comment 1. INVASIVE CARCINOMA OF THE BREAST: Resection Procedure: Excision Specimen Laterality: Right Tumor Size: 2.4 Histologic Type: Invasive carcinoma of no special type (ductal, not otherwise specified) Histologic Grade: Glandular (Acinar)/Tubular Differentiation: Score 2 1 of 3 FINAL for Veronica [name], Veronica [name] ([id number]) Microscopic Comment(continued) Nuclear Pleomorphism: Score 3 Mitotic Rate: Score 2 Overall  Grade: Nottingham Grade 2 Ductal Carcinoma In Situ: Present Tumor Extension: N/A Margins: Involved by carcinoma (medial and posterior margins) DCIS Margins: Distance from closest margin (millimeters): 5 Specify closest margin (required only if <8m): Posterior, medial Regional Lymph Nodes: Number of Lymph Nodes Examined: 4 Number of Sentinel Nodes Examined (if applicable): 4 Number of Lymph Nodes with Macrometastases (>2 mm): 1 Number of Lymph Nodes with Micrometastases: 0 Number of Lymph Nodes with Isolated Tumor Cells (?0.2 mm or ?200 cells)#: 1 Size of Largest Metastatic Deposit (millimeters): 6 mm Extranodal Extension: Present (see part 2; 442m Treatment Effect: No known presurgical therapy Breast Biomarker Testing Performed on Previous Biopsy: Testing Performed on Case Number: [id number] Estrogen Receptor: Positive (30%, moderate) Progesterone Receptor: Positive (30%, strong) HER2: Negative (1+, IHC) ki-67: 20% Representative tumor block: 1C Pathologic Stage Classification (pTNM, AJCC 8th Edition): pT2, pN1a (v4.3.0.0) COMMENT: Cytokeratin AE1/3 and E-cadherin supports the diagnosis of invasive ductal carcinoma and highlights the presence of tumor at the inked medial and posterior margins. Preliminary results of this case were discussed with Dr. CoBrantley Stagen June 14, 2019. 2. The metastatic deposit measures 0.6 cm in greatest linear extent. Cytokeratin AE1/3 highlights the extent of the extracapsular extension (0.4). 3. Cytokeratin AE1/3 highlights isolated tumor cells within the lymph node. 4. -5. Given the lobular-like morphology, cytokeratin AE1/3 was performed on the sentinel lymph nodes to exclude micrometastasis. There is no evidence of metastatic carcinoma by immunohistochemistry. DAThressa ShellerD Pathologist, Electronic Signature (Case signed 06/20/2019).  The patient is a [age]ear old female.   Allergies (TSallyanne KusterCMA; 07/06/2019 10:21 AM) No Known Allergies  [05/07/2019]: Allergies Reconciled  Medication History (TSallyanne KusterCMA; 07/06/2019 10:22 AM) Olmesartan Medoxomil-HCTZ (20-12.5MG Tablet, Oral) Active. [facility] Vitamin D (1000UNIT Capsule, Oral) Active. Medications Reconciled    Vitals (TSallyanne KusterMA; 07/06/2019  10:23 AM) 07/06/2019 10:22 AM Weight: 226.8 lb Height: 61in Body Surface Area: 1.99 m Body Mass Index: 42.85 kg/m  Temp.: 98.39F  Pulse: 64 (Regular)  P.OX: 95% (Room air) BP: 115/80 (Sitting, Right Arm, Standard)        Physical Exam (Lealon Vanputten A. Vernell Back MD; 07/06/2019 10:59 AM)  Breast Note: Right axillary incision clean dry and intact.    Assessment & Plan (Vivi Piccirilli A. Keegen Heffern MD; 07/06/2019 11:00 AM)  POST-OPERATIVE STATE (Z98.890)   POST-OPERATIVE STATE [id number]) Impression: plan for re excision 10/6 Risk of lumpectomy include bleeding, infection, seroma, more surgery, wound care, cosmetic deformity and the need for other treatments , blood clots, cardiovascular event death. Pt agrees to proceed.  Current Plans Pt Education - [employer] Free Text Education/Instructions: discussed with patient and provided information.

## 2019-07-06 NOTE — H&P (View-Only) (Signed)
Veronica [name] Documented: 07/06/2019 10:20 AM Location: [facility] Patient #: [id number] DOB: 1970-11-16 Married / Language: Cleophus Molt / Race: White Female  History of Present Illness Veronica Moores A. Veronica Renfrow MD; 07/06/2019 10:59 AM) Patient words: Patient returns after right breast lumpectomy for stage II right breast cancer. She had 2 positive margins and is scheduled for reexcision on 07/17/2019. She had 1 positive lymph node to the liver negative. A third showed isolated tumor cells. Discussed with oncology lymph node dissection not recommended. Overall, she has no complaints.    Diagnosis 1. Breast, lumpectomy, Right w/seed - INVASIVE DUCTAL CARCINOMA, NOTTINGHAM GRADE 2 OF 3, 2.4 CM - DUCTAL CARCINOMA IN-SITU, INTERMEDIATE NUCLEAR GRADE - CARCINOMA INVOLVES THE MEDIAL AND POSTERIOR MARGINS - LYMPHOVASCULAR SPACE INVASION PRESENT - PREVIOUS BIOPSY SITE CHANGES - SEE ONCOLOGY TABLE AND COMMENT BELOW 2. Lymph node, sentinel, biopsy, Right axillary w/ target seed - METASTATIC CARCINOMA INVOLVING ONE LYMPH NODE WITH EXTRACAPSULAR EXTENSION (1/1) - LYMPHOVASCULAR SPACE INVASION PRESENT - PREVIOUS BIOPSY SITE CHANGES - SEE COMMENT 3. Lymph node, sentinel, biopsy, Right Axillary - ISOLATED TUMOR CELLS INVOLVING ONE LYMPH NODE (0I/1) - SEE COMMENT 4. Lymph node, sentinel, biopsy, Right - NO CARCINOMA IDENTIFIED IN ONE LYMPH NODE (0/1) - SEE COMMENT 5. Lymph node, sentinel, biopsy, Right - NO CARCINOMA IDENTIFIED IN ONE LYMPH NODE (0/1) - SEE COMMENT Microscopic Comment 1. INVASIVE CARCINOMA OF THE BREAST: Resection Procedure: Excision Specimen Laterality: Right Tumor Size: 2.4 Histologic Type: Invasive carcinoma of no special type (ductal, not otherwise specified) Histologic Grade: Glandular (Acinar)/Tubular Differentiation: Score 2 1 of 3 FINAL for [name], [name] ([id number]) Microscopic Comment(continued) Nuclear Pleomorphism: Score 3 Mitotic Rate: Score 2 Overall  Grade: Nottingham Grade 2 Ductal Carcinoma In Situ: Present Tumor Extension: N/A Margins: Involved by carcinoma (medial and posterior margins) DCIS Margins: Distance from closest margin (millimeters): 5 Specify closest margin (required only if <3m): Posterior, medial Regional Lymph Nodes: Number of Lymph Nodes Examined: 4 Number of Sentinel Nodes Examined (if applicable): 4 Number of Lymph Nodes with Macrometastases (>2 mm): 1 Number of Lymph Nodes with Micrometastases: 0 Number of Lymph Nodes with Isolated Tumor Cells (?0.2 mm or ?200 cells)#: 1 Size of Largest Metastatic Deposit (millimeters): 6 mm Extranodal Extension: Present (see part 2; 431m Treatment Effect: No known presurgical therapy Breast Biomarker Testing Performed on Previous Biopsy: Testing Performed on Case Number: [id number] Estrogen Receptor: Positive (30%, moderate) Progesterone Receptor: Positive (30%, strong) HER2: Negative (1+, IHC) ki-67: 20% Representative tumor block: 1C Pathologic Stage Classification (pTNM, AJCC 8th Edition): pT2, pN1a (v4.3.0.0) COMMENT: Cytokeratin AE1/3 and E-cadherin supports the diagnosis of invasive ductal carcinoma and highlights the presence of tumor at the inked medial and posterior margins. Preliminary results of this case were discussed with Veronica [name] June 14, 2019. 2. The metastatic deposit measures 0.6 cm in greatest linear extent. Cytokeratin AE1/3 highlights the extent of the extracapsular extension (0.4). 3. Cytokeratin AE1/3 highlights isolated tumor cells within the lymph node. 4. -5. Given the lobular-like morphology, cytokeratin AE1/3 was performed on the sentinel lymph nodes to exclude micrometastasis. There is no evidence of metastatic carcinoma by immunohistochemistry. DAThressa ShellerD Pathologist, Electronic Signature (Case signed 06/20/2019).  The patient is a [age]ear old female.   Allergies (TSallyanne KusterCMA; 07/06/2019 10:21 AM) No Known Allergies  [05/07/2019]: Allergies Reconciled  Medication History (TSallyanne KusterCMA; 07/06/2019 10:22 AM) Olmesartan Medoxomil-HCTZ (20-12.5MG Tablet, Oral) Active. [facility] Vitamin D (1000UNIT Capsule, Oral) Active. Medications Reconciled    Vitals (TSallyanne KusterMA; 07/06/2019  10:23 AM) 07/06/2019 10:22 AM Weight: 226.8 lb Height: 61in Body Surface Area: 1.99 m Body Mass Index: 42.85 kg/m  Temp.: 98.48F  Pulse: 64 (Regular)  P.OX: 95% (Room air) BP: 115/80 (Sitting, Right Arm, Standard)        Physical Exam (Veronica Welker A. Aranda Bihm MD; 07/06/2019 10:59 AM)  Breast Note: Right axillary incision clean dry and intact.    Assessment & Plan (Veronica Crescenzo A. Tanija Germani MD; 07/06/2019 11:00 AM)  POST-OPERATIVE STATE (Z98.890)   POST-OPERATIVE STATE [phone]) Impression: plan for re excision 10/6 Risk of lumpectomy include bleeding, infection, seroma, more surgery, wound care, cosmetic deformity and the need for other treatments , blood clots, cardiovascular event death. Pt agrees to proceed.  Current Plans Pt Education - [employer] Free Text Education/Instructions: discussed with patient and provided information.

## 2019-07-10 ENCOUNTER — Other Ambulatory Visit: Payer: 59

## 2019-07-10 ENCOUNTER — Ambulatory Visit: Payer: 59

## 2019-07-10 ENCOUNTER — Other Ambulatory Visit: Payer: Self-pay

## 2019-07-10 ENCOUNTER — Encounter (HOSPITAL_BASED_OUTPATIENT_CLINIC_OR_DEPARTMENT_OTHER): Payer: Self-pay | Admitting: *Deleted

## 2019-07-13 ENCOUNTER — Encounter (HOSPITAL_BASED_OUTPATIENT_CLINIC_OR_DEPARTMENT_OTHER)
Admission: RE | Admit: 2019-07-13 | Discharge: 2019-07-13 | Disposition: A | Discharge status: Home / Self Care | Payer: 59 | Source: Ambulatory Visit | Attending: Surgery | Admitting: Surgery

## 2019-07-13 ENCOUNTER — Other Ambulatory Visit: Payer: Self-pay

## 2019-07-13 ENCOUNTER — Other Ambulatory Visit (HOSPITAL_COMMUNITY)
Admission: RE | Admit: 2019-07-13 | Discharge: 2019-07-13 | Disposition: A | Discharge status: Home / Self Care | Payer: 59 | Source: Ambulatory Visit | Attending: Surgery | Admitting: Surgery

## 2019-07-13 DIAGNOSIS — C50911 Malignant neoplasm of unspecified site of right female breast: Secondary | ICD-10-CM | POA: Insufficient documentation

## 2019-07-13 DIAGNOSIS — Z01812 Encounter for preprocedural laboratory examination: Secondary | ICD-10-CM | POA: Diagnosis present

## 2019-07-13 DIAGNOSIS — Z20828 Contact with and (suspected) exposure to other viral communicable diseases: Secondary | ICD-10-CM | POA: Diagnosis not present

## 2019-07-13 LAB — BASIC METABOLIC PANEL
Anion gap: 7 (ref 5–15)
BUN: 9 mg/dL (ref 6–20)
CO2: 23 mmol/L (ref 22–32)
Calcium: 9.3 mg/dL (ref 8.9–10.3)
Chloride: 107 mmol/L (ref 98–111)
Creatinine, Ser: 0.89 mg/dL (ref 0.44–1.00)
GFR calc Af Amer: >60 mL/min (ref >60)
GFR calc non Af Amer: >60 mL/min (ref >60)
Glucose, Bld: 118 mg/dL — ABNORMAL HIGH (ref 70–99)
Potassium: 4.3 mmol/L (ref 3.5–5.1)
Sodium: 137 mmol/L (ref 135–145)

## 2019-07-13 MED ORDER — CHLORHEXIDINE GLUCONATE CLOTH 2 % EX PADS: 6.0000 | MEDICATED_PAD | Freq: Once | CUTANEOUS | Status: DC

## 2019-07-13 NOTE — Progress Notes (Signed)

## 2019-07-15 LAB — NOVEL CORONAVIRUS, NAA (HOSP ORDER, SEND-OUT TO REF LAB; TAT 18-24 HRS): SARS-CoV-2, NAA: NOT DETECTED

## 2019-07-17 ENCOUNTER — Other Ambulatory Visit: Payer: Self-pay

## 2019-07-17 ENCOUNTER — Ambulatory Visit: Payer: 59 | Admitting: Adult Health

## 2019-07-17 ENCOUNTER — Ambulatory Visit (HOSPITAL_BASED_OUTPATIENT_CLINIC_OR_DEPARTMENT_OTHER): Payer: 59 | Admitting: Anesthesiology

## 2019-07-17 ENCOUNTER — Encounter (HOSPITAL_BASED_OUTPATIENT_CLINIC_OR_DEPARTMENT_OTHER)
Admission: RE | Disposition: A | Discharge status: Home / Self Care | Payer: Self-pay | Source: Home / Self Care | Attending: Surgery

## 2019-07-17 ENCOUNTER — Ambulatory Visit (HOSPITAL_BASED_OUTPATIENT_CLINIC_OR_DEPARTMENT_OTHER)
Admission: RE | Admit: 2019-07-17 | Discharge: 2019-07-17 | Disposition: A | Discharge status: Home / Self Care | Payer: 59 | Attending: Surgery | Admitting: Surgery

## 2019-07-17 ENCOUNTER — Encounter (HOSPITAL_BASED_OUTPATIENT_CLINIC_OR_DEPARTMENT_OTHER): Payer: Self-pay

## 2019-07-17 DIAGNOSIS — Z6841 Body Mass Index (BMI) 40.0 and over, adult: Secondary | ICD-10-CM | POA: Diagnosis not present

## 2019-07-17 DIAGNOSIS — I10 Essential (primary) hypertension: Secondary | ICD-10-CM | POA: Diagnosis not present

## 2019-07-17 DIAGNOSIS — C50411 Malignant neoplasm of upper-outer quadrant of right female breast: Secondary | ICD-10-CM | POA: Diagnosis present

## 2019-07-17 DIAGNOSIS — C773 Secondary and unspecified malignant neoplasm of axilla and upper limb lymph nodes: Secondary | ICD-10-CM | POA: Insufficient documentation

## 2019-07-17 HISTORY — PX: RE-EXCISION OF BREAST LUMPECTOMY: SHX6048

## 2019-07-17 SURGERY — EXCISION, LESION, BREAST
Anesthesia: General | Site: Breast | Laterality: Right

## 2019-07-17 MED ORDER — CEFAZOLIN SODIUM-DEXTROSE 2-4 GM/100ML-% IV SOLN
INTRAVENOUS | Status: AC
  Filled 2019-07-17: qty 100

## 2019-07-17 MED ORDER — PROPOFOL 10 MG/ML IV BOLUS
INTRAVENOUS | Status: AC
  Filled 2019-07-17: qty 20

## 2019-07-17 MED ORDER — PHENYLEPHRINE HCL (PRESSORS) 10 MG/ML IV SOLN
INTRAVENOUS | Status: DC | PRN
  Administered 2019-07-17 (×4): 80 ug via INTRAVENOUS
  Administered 2019-07-17: 120 ug via INTRAVENOUS
  Administered 2019-07-17: 80 ug via INTRAVENOUS
  Administered 2019-07-17: 120 ug via INTRAVENOUS
  Administered 2019-07-17: 80 ug via INTRAVENOUS
  Administered 2019-07-17: 120 ug via INTRAVENOUS
  Administered 2019-07-17: 80 ug via INTRAVENOUS

## 2019-07-17 MED ORDER — FENTANYL CITRATE (PF) 100 MCG/2ML IJ SOLN
50.0000 ug | INTRAMUSCULAR | Status: DC | PRN
  Administered 2019-07-17: 13:00:00 50 ug via INTRAVENOUS

## 2019-07-17 MED ORDER — LIDOCAINE HCL (CARDIAC) PF 100 MG/5ML IV SOSY
PREFILLED_SYRINGE | INTRAVENOUS | Status: DC | PRN
  Administered 2019-07-17: 80 mg via INTRAVENOUS

## 2019-07-17 MED ORDER — BUPIVACAINE-EPINEPHRINE 0.5% -1:200000 IJ SOLN
INTRAMUSCULAR | Status: DC | PRN
  Administered 2019-07-17: 7 mL

## 2019-07-17 MED ORDER — PROPOFOL 10 MG/ML IV BOLUS
INTRAVENOUS | Status: DC | PRN
  Administered 2019-07-17: 50 mg via INTRAVENOUS
  Administered 2019-07-17: 200 mg via INTRAVENOUS

## 2019-07-17 MED ORDER — FENTANYL CITRATE (PF) 100 MCG/2ML IJ SOLN: 25.0000 ug | INTRAMUSCULAR | Status: DC | PRN

## 2019-07-17 MED ORDER — DEXAMETHASONE SODIUM PHOSPHATE 10 MG/ML IJ SOLN
INTRAMUSCULAR | Status: AC
  Filled 2019-07-17: qty 1

## 2019-07-17 MED ORDER — CELECOXIB 200 MG PO CAPS
200.0000 mg | ORAL_CAPSULE | ORAL | Status: AC
  Administered 2019-07-17: 200 mg via ORAL

## 2019-07-17 MED ORDER — MIDAZOLAM HCL 2 MG/2ML IJ SOLN: 1.0000 mg | INTRAMUSCULAR | Status: DC | PRN

## 2019-07-17 MED ORDER — GABAPENTIN 300 MG PO CAPS
ORAL_CAPSULE | ORAL | Status: AC
  Filled 2019-07-17: qty 1

## 2019-07-17 MED ORDER — LACTATED RINGERS IV SOLN
INTRAVENOUS | Status: DC
  Administered 2019-07-17: 13:00:00 via INTRAVENOUS

## 2019-07-17 MED ORDER — CELECOXIB 200 MG PO CAPS
ORAL_CAPSULE | ORAL | Status: AC
  Filled 2019-07-17: qty 1

## 2019-07-17 MED ORDER — ACETAMINOPHEN 500 MG PO TABS
1000.0000 mg | ORAL_TABLET | ORAL | Status: AC
  Administered 2019-07-17: 1000 mg via ORAL

## 2019-07-17 MED ORDER — GABAPENTIN 300 MG PO CAPS
300.0000 mg | ORAL_CAPSULE | ORAL | Status: AC
  Administered 2019-07-17: 300 mg via ORAL

## 2019-07-17 MED ORDER — DEXTROSE 5 % IV SOLN: 3.0000 g | INTRAVENOUS | Status: DC

## 2019-07-17 MED ORDER — DEXAMETHASONE SODIUM PHOSPHATE 10 MG/ML IJ SOLN
INTRAMUSCULAR | Status: DC | PRN
  Administered 2019-07-17: 10 mg via INTRAVENOUS

## 2019-07-17 MED ORDER — FENTANYL CITRATE (PF) 100 MCG/2ML IJ SOLN
INTRAMUSCULAR | Status: AC
  Filled 2019-07-17: qty 2

## 2019-07-17 MED ORDER — CEFAZOLIN SODIUM-DEXTROSE 2-3 GM-%(50ML) IV SOLR
INTRAVENOUS | Status: DC | PRN
  Administered 2019-07-17: 2 g via INTRAVENOUS

## 2019-07-17 MED ORDER — HYDROCODONE-ACETAMINOPHEN 5-325 MG PO TABS: 1.0000 | ORAL_TABLET | Freq: Four times a day (QID) | ORAL | 0 refills | Status: DC | PRN

## 2019-07-17 MED ORDER — ACETAMINOPHEN 500 MG PO TABS
ORAL_TABLET | ORAL | Status: AC
  Filled 2019-07-17: qty 2

## 2019-07-17 MED ORDER — ONDANSETRON HCL 4 MG/2ML IJ SOLN
INTRAMUSCULAR | Status: AC
  Filled 2019-07-17: qty 2

## 2019-07-17 MED ORDER — LIDOCAINE 2% (20 MG/ML) 5 ML SYRINGE
INTRAMUSCULAR | Status: AC
  Filled 2019-07-17: qty 5

## 2019-07-17 MED ORDER — ONDANSETRON HCL 4 MG/2ML IJ SOLN
INTRAMUSCULAR | Status: DC | PRN
  Administered 2019-07-17: 4 mg via INTRAVENOUS

## 2019-07-17 SURGICAL SUPPLY — 38 items
BLADE SURG 15 STRL LF DISP TIS (BLADE) ×1 IMPLANT
BLADE SURG 15 STRL SS (BLADE) ×2
CANISTER SUCT 1200ML W/VALVE (MISCELLANEOUS) ×3 IMPLANT
CHLORAPREP W/TINT 26 (MISCELLANEOUS) ×3 IMPLANT
COVER BACK TABLE REUSABLE LG (DRAPES) ×3 IMPLANT
COVER MAYO STAND REUSABLE (DRAPES) ×3 IMPLANT
DECANTER SPIKE VIAL GLASS SM (MISCELLANEOUS) ×3 IMPLANT
DERMABOND ADVANCED (GAUZE/BANDAGES/DRESSINGS) ×2
DERMABOND ADVANCED .7 DNX12 (GAUZE/BANDAGES/DRESSINGS) ×1 IMPLANT
DRAPE LAPAROTOMY 100X72 PEDS (DRAPES) ×3 IMPLANT
DRAPE UTILITY XL STRL (DRAPES) ×3 IMPLANT
ELECT COATED BLADE 2.86 ST (ELECTRODE) ×3 IMPLANT
ELECT REM PT RETURN 9FT ADLT (ELECTROSURGICAL) ×3
ELECTRODE REM PT RTRN 9FT ADLT (ELECTROSURGICAL) ×1 IMPLANT
GLOVE BIOGEL PI IND STRL 6.5 (GLOVE) IMPLANT
GLOVE BIOGEL PI IND STRL 8 (GLOVE) ×1 IMPLANT
GLOVE BIOGEL PI INDICATOR 6.5 (GLOVE) ×4
GLOVE BIOGEL PI INDICATOR 8 (GLOVE) ×2
GLOVE ECLIPSE 6.5 STRL STRAW (GLOVE) ×2 IMPLANT
GLOVE ECLIPSE 8.0 STRL XLNG CF (GLOVE) ×3 IMPLANT
GOWN STRL REUS W/ TWL LRG LVL3 (GOWN DISPOSABLE) ×2 IMPLANT
GOWN STRL REUS W/TWL LRG LVL3 (GOWN DISPOSABLE) ×4
HEMOSTAT ARISTA ABSORB 3G PWDR (HEMOSTASIS) IMPLANT
KIT MARKER MARGIN INK (KITS) ×2 IMPLANT
NDL HYPO 25X1 1.5 SAFETY (NEEDLE) ×1 IMPLANT
NEEDLE HYPO 25X1 1.5 SAFETY (NEEDLE) ×3 IMPLANT
NS IRRIG 1000ML POUR BTL (IV SOLUTION) ×3 IMPLANT
PACK BASIN DAY SURGERY FS (CUSTOM PROCEDURE TRAY) ×3 IMPLANT
PENCIL BUTTON HOLSTER BLD 10FT (ELECTRODE) ×3 IMPLANT
SLEEVE SCD COMPRESS KNEE MED (MISCELLANEOUS) ×3 IMPLANT
SPONGE LAP 4X18 RFD (DISPOSABLE) ×3 IMPLANT
SUT MON AB 4-0 PC3 18 (SUTURE) ×3 IMPLANT
SUT VICRYL 3-0 CR8 SH (SUTURE) ×3 IMPLANT
SYR CONTROL 10ML LL (SYRINGE) ×3 IMPLANT
TOWEL GREEN STERILE FF (TOWEL DISPOSABLE) ×6 IMPLANT
TUBE CONNECTING 20'X1/4 (TUBING) ×1
TUBE CONNECTING 20X1/4 (TUBING) ×2 IMPLANT
YANKAUER SUCT BULB TIP NO VENT (SUCTIONS) ×3 IMPLANT

## 2019-07-17 NOTE — Op Note (Signed)
Preoperative diagnosis right breast cancer with positive margins  Postop diagnosis: Same  Procedure: Reexcision right breast lumpectomy  Surgeon: Erroll Luna, MD  Anesthesia: LMA with local consisting of 0.5% Sensorcaine  EBL: Minimal  Specimen: Anterior, posterior, medial, lateral, superior and inferior margins all reexcised and oriented with ink  Drains: None  IV fluids: Per anesthesia record  Indications for procedure: The patient is a 48 year old female who underwent a right breast lumpectomy.  She unfortunately she had positive margins and both margins were close.  She presents today for reexcision.The procedure has been discussed with the patient. Alternatives to surgery have been discussed with the patient.  Risks of surgery include bleeding,  Infection,  Seroma formation, death,  and the need for further surgery.   The patient understands and wishes to proceed.   Description of procedure: The patient was met in the holding area.  The right breast was marked as correct side.  She was taken back the operating.  She is placed supine upon the operating room table.  After induction of general anesthesia, the right breast was prepped and draped in sterile fashion timeout was performed.  The right breast incision in the axilla was opened and the seroma evacuated.  All margins were excised as indicated above oriented and sent to pathology.  The cavities made hemostatic with cautery and then closed with 3-0 Vicryl and 4-0 Monocryl.  Dermabond applied.  All final counts were found to be correct.  The patient awoke extubated taken recovery in satisfactory condition.

## 2019-07-17 NOTE — Anesthesia Procedure Notes (Signed)
Procedure Name: LMA Insertion Performed by: Duanne Duchesne M, CRNA Pre-anesthesia Checklist: Patient identified, Emergency Drugs available, Suction available and Patient being monitored Patient Re-evaluated:Patient Re-evaluated prior to induction Oxygen Delivery Method: Circle system utilized Preoxygenation: Pre-oxygenation with 100% oxygen Induction Type: IV induction Ventilation: Mask ventilation without difficulty LMA: LMA inserted LMA Size: 4.0 Number of attempts: 1 Airway Equipment and Method: Bite block Placement Confirmation: positive ETCO2 Tube secured with: Tape Dental Injury: Teeth and Oropharynx as per pre-operative assessment        

## 2019-07-17 NOTE — Interval H&P Note (Signed)
History and Physical Interval Note:  07/17/2019 12:30 PM  Veronica [name]  has presented today for surgery, with the diagnosis of RIGHT BREAST CANCER.  The various methods of treatment have been discussed with the patient and family. After consideration of risks, benefits and other options for treatment, the patient has consented to  Procedure(s): RE-EXCISION OF RIGHT BREAST LUMPECTOMY (Right) as a surgical intervention.  The patient's history has been reviewed, patient examined, no change in status, stable for surgery.  I have reviewed the patient's chart and labs.  Questions were answered to the patient's satisfaction.     [place]

## 2019-07-17 NOTE — Discharge Instructions (Signed)
[facility] Office Phone Number [phone]  BREAST BIOPSY/ PARTIAL MASTECTOMY: POST OP INSTRUCTIONS  Always review your discharge instruction sheet given to you by the facility where your surgery was performed.  IF YOU HAVE DISABILITY OR FAMILY LEAVE FORMS, YOU MUST BRING THEM TO THE OFFICE FOR PROCESSING.  DO NOT GIVE THEM TO YOUR DOCTOR.  1. A prescription for pain medication may be given to you upon discharge.  Take your pain medication as prescribed, if needed.  If narcotic pain medicine is not needed, then you may take acetaminophen (Tylenol) or ibuprofen (Advil) as needed. Do not take Tylenol until 630pm and Ibuprofen until 830pm. 2. Take your usually prescribed medications unless otherwise directed 3. If you need a refill on your pain medication, please contact your pharmacy.  They will contact our office to request authorization.  Prescriptions will not be filled after 5pm or on week-ends. 4. You should eat very light the first 24 hours after surgery, such as soup, crackers, pudding, etc.  Resume your normal diet the day after surgery. 5. Most patients will experience some swelling and bruising in the breast.  Ice packs and a good support bra will help.  Swelling and bruising can take several days to resolve.  6. It is common to experience some constipation if taking pain medication after surgery.  Increasing fluid intake and taking a stool softener will usually help or prevent this problem from occurring.  A mild laxative (Milk of Magnesia or Miralax) should be taken according to package directions if there are no bowel movements after 48 hours. 7. Unless discharge instructions indicate otherwise, you may remove your bandages 24-48 hours after surgery, and you may shower at that time.  You may have steri-strips (small skin tapes) in place directly over the incision.  These strips should be left on the skin for 7-10 days.  If your surgeon used skin glue on the incision, you  may shower in 24 hours.  The glue will flake off over the next 2-3 weeks.  Any sutures or staples will be removed at the office during your follow-up visit. 8. ACTIVITIES:  You may resume regular daily activities (gradually increasing) beginning the next day.  Wearing a good support bra or sports bra minimizes pain and swelling.  You may have sexual intercourse when it is comfortable. a. You may drive when you no longer are taking prescription pain medication, you can comfortably wear a seatbelt, and you can safely maneuver your car and apply brakes. b. RETURN TO WORK:  ______________________________________________________________________________________ 9. You should see your doctor in the office for a follow-up appointment approximately two weeks after your surgery.  Your doctors nurse will typically make your follow-up appointment when she calls you with your pathology report.  Expect your pathology report 2-3 business days after your surgery.  You may call to check if you do not hear from [place] after three days. 10. OTHER INSTRUCTIONS: _______________________________________________________________________________________________ _____________________________________________________________________________________________________________________________________ _____________________________________________________________________________________________________________________________________ _____________________________________________________________________________________________________________________________________  WHEN TO CALL YOUR DOCTOR: 1. Fever over 101.0 2. Nausea and/or vomiting. 3. Extreme swelling or bruising. 4. Continued bleeding from incision. 5. Increased pain, redness, or drainage from the incision.  The clinic staff is available to answer your questions during regular business hours.  Please dont hesitate to call and ask to speak to one of the nurses for clinical  concerns.  If you have a medical emergency, go to the nearest emergency room or call 911.  A surgeon from [facility] is always on call at  the hospital.  For further questions, please visit [email or web address]    Post Anesthesia Home Care Instructions  Activity: Get plenty of rest for the remainder of the day. A responsible individual must stay with you for 24 hours following the procedure.  For the next 24 hours, DO NOT: -Drive a car -Paediatric nurse -Drink alcoholic beverages -Take any medication unless instructed by your physician -Make any legal decisions or sign important papers.  Meals: Start with liquid foods such as gelatin or soup. Progress to regular foods as tolerated. Avoid greasy, spicy, heavy foods. If nausea and/or vomiting occur, drink only clear liquids until the nausea and/or vomiting subsides. Call your physician if vomiting continues.  Special Instructions/Symptoms: Your throat may feel dry or sore from the anesthesia or the breathing tube placed in your throat during surgery. If this causes discomfort, gargle with warm salt water. The discomfort should disappear within 24 hours.  If you had a scopolamine patch placed behind your ear for the management of post- operative nausea and/or vomiting:  1. The medication in the patch is effective for 72 hours, after which it should be removed.  Wrap patch in a tissue and discard in the trash. Wash hands thoroughly with soap and water. 2. You may remove the patch earlier than 72 hours if you experience unpleasant side effects which may include dry mouth, dizziness or visual disturbances. 3. Avoid touching the patch. Wash your hands with soap and water after contact with the patch.

## 2019-07-17 NOTE — Anesthesia Postprocedure Evaluation (Signed)
Anesthesia Post Note  Patient: Veronica [name]  Procedure(s) Performed: RE-EXCISION OF RIGHT BREAST LUMPECTOMY (Right Breast)     Patient location during evaluation: PACU Anesthesia Type: General Level of consciousness: awake and sedated Pain management: pain level controlled Vital Signs Assessment: post-procedure vital signs reviewed and stable Respiratory status: spontaneous breathing Cardiovascular status: stable Postop Assessment: no apparent nausea or vomiting Anesthetic complications: no    Last Vitals:  Vitals:   07/17/19 1345 07/17/19 1400  BP: 106/61 111/60  Pulse: 73 69  Resp: 16 (!) 23  Temp:    SpO2: 100% 100%    Last Pain:  Vitals:   07/17/19 1400  TempSrc:   PainSc: 3    Pain Goal:                   Huston Foley

## 2019-07-17 NOTE — Transfer of Care (Signed)
Immediate Anesthesia Transfer of Care Note  Patient: Veronica [name]  Procedure(s) Performed: RE-EXCISION OF RIGHT BREAST LUMPECTOMY (Right Breast)  Patient Location: PACU  Anesthesia Type:General  Level of Consciousness: awake, alert  and oriented  Airway & Oxygen Therapy: Patient Spontanous Breathing and Patient connected to face mask oxygen  Post-op Assessment: Report given to RN and Post -op Vital signs reviewed and stable  Post vital signs: Reviewed and stable  Last Vitals:  Vitals Value Taken Time  BP    Temp    Pulse 90 07/17/19 1328  Resp    SpO2 100 % 07/17/19 1328  Vitals shown include unvalidated device data.  Last Pain:  Vitals:   07/17/19 1233  TempSrc: Oral  PainSc: 0-No pain         Complications: No apparent anesthesia complications

## 2019-07-17 NOTE — Anesthesia Preprocedure Evaluation (Addendum)
Anesthesia Evaluation  Patient identified by MRN, date of birth, ID band Patient awake    Reviewed: Allergy & Precautions, NPO status , Patient's Chart, lab work & pertinent test results  Airway Mallampati: II       Dental no notable dental hx. (+) Teeth Intact   Pulmonary neg pulmonary ROS,    Pulmonary exam normal breath sounds clear to auscultation       Cardiovascular hypertension, Pt. on medications negative cardio ROS Normal cardiovascular exam Rhythm:Regular Rate:Normal  TTE 06/2019 EF 60-65%, no significant valvular abnormalities   Neuro/Psych negative psych ROS   GI/Hepatic negative GI ROS, Neg liver ROS,   Endo/Other  Morbid obesity  Renal/GU negative Renal ROS  negative genitourinary   Musculoskeletal negative musculoskeletal ROS (+)   Abdominal (+) + obese,   Peds  Hematology negative hematology ROS (+)   Anesthesia Other Findings Right breast cancer  Reproductive/Obstetrics                            Anesthesia Physical Anesthesia Plan  ASA: III  Anesthesia Plan: General   Post-op Pain Management:    Induction: Intravenous  PONV Risk Score and Plan: 3 and Midazolam, Dexamethasone and Ondansetron  Airway Management Planned: LMA  Additional Equipment:   Intra-op Plan:   Post-operative Plan: Extubation in OR  Informed Consent: I have reviewed the patients History and Physical, chart, labs and discussed the procedure including the risks, benefits and alternatives for the proposed anesthesia with the patient or authorized representative who has indicated his/her understanding and acceptance.     Dental advisory given  Plan Discussed with: CRNA  Anesthesia Plan Comments:         Anesthesia Quick Evaluation

## 2019-07-18 ENCOUNTER — Encounter (HOSPITAL_BASED_OUTPATIENT_CLINIC_OR_DEPARTMENT_OTHER): Payer: Self-pay | Admitting: Surgery

## 2019-07-20 LAB — SURGICAL PATHOLOGY

## 2019-07-24 ENCOUNTER — Encounter: Payer: Self-pay | Admitting: *Deleted

## 2019-07-24 ENCOUNTER — Other Ambulatory Visit: Payer: Self-pay

## 2019-07-24 ENCOUNTER — Telehealth: Payer: Self-pay | Admitting: *Deleted

## 2019-07-24 ENCOUNTER — Inpatient Hospital Stay: Payer: 59

## 2019-07-24 ENCOUNTER — Inpatient Hospital Stay: Payer: 59 | Attending: Oncology

## 2019-07-24 ENCOUNTER — Inpatient Hospital Stay (HOSPITAL_BASED_OUTPATIENT_CLINIC_OR_DEPARTMENT_OTHER): Payer: 59 | Admitting: Oncology

## 2019-07-24 VITALS — BP 106/41 | HR 77 | Temp 98.9°F | Resp 18 | Ht 61.0 in | Wt 223.4 lb

## 2019-07-24 DIAGNOSIS — Z5111 Encounter for antineoplastic chemotherapy: Secondary | ICD-10-CM | POA: Diagnosis present

## 2019-07-24 DIAGNOSIS — Z170 Estrogen receptor positive status [ER+]: Secondary | ICD-10-CM

## 2019-07-24 DIAGNOSIS — C50411 Malignant neoplasm of upper-outer quadrant of right female breast: Secondary | ICD-10-CM | POA: Insufficient documentation

## 2019-07-24 DIAGNOSIS — Z5189 Encounter for other specified aftercare: Secondary | ICD-10-CM | POA: Diagnosis not present

## 2019-07-24 DIAGNOSIS — D709 Neutropenia, unspecified: Secondary | ICD-10-CM | POA: Diagnosis not present

## 2019-07-24 DIAGNOSIS — Z90710 Acquired absence of both cervix and uterus: Secondary | ICD-10-CM | POA: Diagnosis not present

## 2019-07-24 DIAGNOSIS — C773 Secondary and unspecified malignant neoplasm of axilla and upper limb lymph nodes: Secondary | ICD-10-CM | POA: Insufficient documentation

## 2019-07-24 DIAGNOSIS — Z95828 Presence of other vascular implants and grafts: Secondary | ICD-10-CM

## 2019-07-24 LAB — CBC WITH DIFFERENTIAL/PLATELET
Abs Immature Granulocytes: 0.06 10*3/uL (ref 0.00–0.07)
Basophils Absolute: 0 10*3/uL (ref 0.0–0.1)
Basophils Relative: 0 %
Eosinophils Absolute: 0.4 10*3/uL (ref 0.0–0.5)
Eosinophils Relative: 4 %
HCT: 42.4 % (ref 36.0–46.0)
Hemoglobin: 14.3 g/dL (ref 12.0–15.0)
Immature Granulocytes: 1 %
Lymphocytes Relative: 21 %
Lymphs Abs: 2.2 10*3/uL (ref 0.7–4.0)
MCH: 29.4 pg (ref 26.0–34.0)
MCHC: 33.7 g/dL (ref 30.0–36.0)
MCV: 87.1 fL (ref 80.0–100.0)
Monocytes Absolute: 0.7 10*3/uL (ref 0.1–1.0)
Monocytes Relative: 7 %
Neutro Abs: 7.1 10*3/uL (ref 1.7–7.7)
Neutrophils Relative %: 67 %
Platelets: 228 10*3/uL (ref 150–400)
RBC: 4.87 MIL/uL (ref 3.87–5.11)
RDW: 12 % (ref 11.5–15.5)
WBC: 10.4 10*3/uL (ref 4.0–10.5)
nRBC: 0 % (ref 0.0–0.2)

## 2019-07-24 LAB — COMPREHENSIVE METABOLIC PANEL
ALT: 20 U/L (ref 0–44)
AST: 14 U/L — ABNORMAL LOW (ref 15–41)
Albumin: 3.6 g/dL (ref 3.5–5.0)
Alkaline Phosphatase: 57 U/L (ref 38–126)
Anion gap: 11 (ref 5–15)
BUN: 10 mg/dL (ref 6–20)
CO2: 24 mmol/L (ref 22–32)
Calcium: 9.4 mg/dL (ref 8.9–10.3)
Chloride: 104 mmol/L (ref 98–111)
Creatinine, Ser: 0.78 mg/dL (ref 0.44–1.00)
GFR calc Af Amer: >60 mL/min (ref >60)
GFR calc non Af Amer: >60 mL/min (ref >60)
Glucose, Bld: 144 mg/dL — ABNORMAL HIGH (ref 70–99)
Potassium: 3.7 mmol/L (ref 3.5–5.1)
Sodium: 139 mmol/L (ref 135–145)
Total Bilirubin: 0.8 mg/dL (ref 0.3–1.2)
Total Protein: 6.8 g/dL (ref 6.5–8.1)

## 2019-07-24 MED ORDER — SODIUM CHLORIDE 0.9% FLUSH
10.0000 mL | INTRAVENOUS | Status: DC | PRN
  Administered 2019-07-24: 10 mL
  Filled 2019-07-24: qty 10

## 2019-07-24 MED ORDER — SODIUM CHLORIDE 0.9 % IV SOLN
600.0000 mg/m2 | Freq: Once | INTRAVENOUS | Status: AC
  Administered 2019-07-24: 1260 mg via INTRAVENOUS
  Filled 2019-07-24: qty 63

## 2019-07-24 MED ORDER — SODIUM CHLORIDE 0.9 % IV SOLN
Freq: Once | INTRAVENOUS | Status: AC
  Administered 2019-07-24: 09:00:00 via INTRAVENOUS
  Filled 2019-07-24: qty 5

## 2019-07-24 MED ORDER — SODIUM CHLORIDE 0.9% FLUSH
10.0000 mL | INTRAVENOUS | Status: DC | PRN
  Administered 2019-07-24: 08:00:00 10 mL
  Filled 2019-07-24: qty 10

## 2019-07-24 MED ORDER — PALONOSETRON HCL INJECTION 0.25 MG/5ML
0.2500 mg | Freq: Once | INTRAVENOUS | Status: AC
  Administered 2019-07-24: 0.25 mg via INTRAVENOUS

## 2019-07-24 MED ORDER — HEPARIN SOD (PORK) LOCK FLUSH 100 UNIT/ML IV SOLN
500.0000 [IU] | Freq: Once | INTRAVENOUS | Status: AC | PRN
  Administered 2019-07-24: 500 [IU]
  Filled 2019-07-24: qty 5

## 2019-07-24 MED ORDER — DOXORUBICIN HCL CHEMO IV INJECTION 2 MG/ML
60.0000 mg/m2 | Freq: Once | INTRAVENOUS | Status: AC
  Administered 2019-07-24: 126 mg via INTRAVENOUS
  Filled 2019-07-24: qty 63

## 2019-07-24 MED ORDER — PEGFILGRASTIM 6 MG/0.6ML ~~LOC~~ PSKT
6.0000 mg | PREFILLED_SYRINGE | Freq: Once | SUBCUTANEOUS | Status: AC
  Administered 2019-07-24: 12:00:00 6 mg via SUBCUTANEOUS

## 2019-07-24 MED ORDER — SODIUM CHLORIDE 0.9 % IV SOLN
Freq: Once | INTRAVENOUS | Status: AC
  Administered 2019-07-24: 09:00:00 via INTRAVENOUS
  Filled 2019-07-24: qty 250

## 2019-07-24 MED ORDER — PALONOSETRON HCL INJECTION 0.25 MG/5ML
INTRAVENOUS | Status: AC
  Filled 2019-07-24: qty 5

## 2019-07-24 MED ORDER — PEGFILGRASTIM 6 MG/0.6ML ~~LOC~~ PSKT
PREFILLED_SYRINGE | SUBCUTANEOUS | Status: AC
  Filled 2019-07-24: qty 0.6

## 2019-07-24 NOTE — Progress Notes (Signed)
[place]  [phone]) [phone] [phone]     ID: Veronica [name] DOB: April 13, 1971  MR#: [id number]  MCN#:[id number]  Patient Care Team: Blair Heys, Hershal Coria as PCP - General (Physician Assistant) Mauro Kaufmann, RN as Oncology Nurse Navigator [name], RN as Oncology Nurse Navigator Erroll Luna, MD as Consulting Physician (General Surgery) Roel Douthat, Virgie Dad, MD as Consulting Physician (Oncology) Kyung Rudd, MD as Consulting Physician (Radiation Oncology) Chauncey Cruel, MD OTHER MD:  CHIEF COMPLAINT: Estrogen receptor positive breast cancer  CURRENT TREATMENT:  adjuvant chemotherapy   INTERVAL HISTORY: Veronica [name] returns today for follow-up and treatment of her estrogen receptor positive breast cancer. She was last seen here on 06/21/2019.   She will begin adjuvant chemotherapy consisting of doxorubicin and cyclophosphamide in dose dense fashion x4 to be followed by weekly paclitaxel x12.  Today is day 1 cycle 1 of AC chemotherapy.  Since her last visit here, she underwent re-excision of the right breast on 07/17/2019. The pathology from this procedure showed ([id number] ): A. Breast, right, anterior margin, re-excision: - biopsy site changes.  - no malignancy identified.  - final anterior margin clear. B. Breast, right posterior margin, re-excision:  - biopsy site changes.  - no malignancy identified.  - final posterior margin clear.  C. Breast, right superior margin, re-excision:  - biopsy site changes.  - no malignancy identified.  - final superior margin clear. D. Breast, right inferior margin, re-excision:  - biopsy site changes.  - no malignancy identified.  - final inferior margin clear.  E. Breast, right medial margin, re-excision:  - ductal carcinoma in situ, 1.8 cm.  - ductal carcinoma in situ less than 0.1 cm from final medial margin.  F. Breast, right lateral margin, re-excision:  - biopsy site changes.  - no  malignancy identified.    - final lateral margin clear.    REVIEW OF SYSTEMS: Veronica [name] did well with her margin clearance.  She had no significant bleeding fever or pain.  She is wearing a binder which she finds comfortable.  At home she has been doing some housework and she and her daughter are taking walks up and down the block.  A detailed review of systems today was otherwise noncontributory   HISTORY OF CURRENT ILLNESS: From the original intake note:  Veronica [name] had routine screening mammography on 04/27/2019 showing a possible abnormality in the right breast. She underwent right diagnostic mammography with tomography and right breast ultrasonography at [facility] on 05/01/2019 showing: breast density category C; highly suspicious 1.7 cm irregular mass in the right breast at 10 o'clock, 8 cm from the nipple; indeterminate hypoechoic round mass in the right axillary tail/low axilla. Physical exam showed no suspicious lumps.  Accordingly on 05/02/2019 she proceeded to biopsy of the right breast area in question. The pathology from this procedure ([id number]) showed: invasive ductal carcinoma, grade 3; ductal carcinoma in situ; lymphovascular invasion present. Prognostic indicators significant for: estrogen receptor, 30% positive with moderate staining intensity, and progesterone receptor, 30% positive with strong staining intensity. Proliferation marker Ki67 at 20%. HER2 negative by immunohistochemistry (1+).  The second "satellite" lesion was a lymph node which was also positive.  The patient's subsequent history is as detailed below.   PAST MEDICAL HISTORY: Past Medical History:  Diagnosis Date  . Cancer ([place])   . Chronic low back pain with left-sided sciatica   . Family history of leukemia   . Family history of stomach cancer   .  Hypertension     PAST SURGICAL HISTORY: Past Surgical History:  Procedure Laterality Date  . BREAST BIOPSY Right 05/02/2019   right clips X2  .  BREAST LUMPECTOMY WITH RADIOACTIVE SEED AND SENTINEL LYMPH NODE BIOPSY Right 06/12/2019   Procedure: RIGHT BREAST RADIOACTIVE SEED LUMPECTOMY  AND RIGHT AXILLARY SEED TARGETED LYMPH NODE AND AXILLARY SENTINEL LYMPH NODE MAPPING;  Surgeon: Erroll Luna, MD;  Location: [facility];  Service: General;  Laterality: Right;  PEC BLOCK  . C/S x2  '98 '03  . CESAREAN SECTION    . CHOLECYSTECTOMY    . LAPAROSCOPIC ASSISTED VAGINAL HYSTERECTOMY  05/17/2011   Procedure: LAPAROSCOPIC ASSISTED VAGINAL HYSTERECTOMY;  Surgeon: Sharene Butters;  Location: [facility];  Service: Gynecology;  Laterality: N/A;  Laparoscopic Assisted Vaginal Hysterectomy With Lysis Of Adhesions  . PORTACATH PLACEMENT Right 06/12/2019   Procedure: INSERTION PORT-A-CATH WITH ULTRASOUND;  Surgeon: Erroll Luna, MD;  Location: [facility];  Service: General;  Laterality: Right;  . RE-EXCISION OF BREAST LUMPECTOMY Right 07/17/2019   Procedure: RE-EXCISION OF RIGHT BREAST LUMPECTOMY;  Surgeon: Erroll Luna, MD;  Location: [facility];  Service: General;  Laterality: Right;  . TUBAL LIGATION      FAMILY HISTORY: Family History  Problem Relation Age of Onset  . Arthritis Mother   . COPD Mother   . Hypertension Mother   . Hyperlipidemia Mother   . Leukemia Brother 29  . Stomach cancer Maternal Grandmother        late 67s   Patient's parents are living (as of September 2020). Her father is 22 and her mother is 75 as of 04/2019. The patient denies a family hx of breast or ovarian cancer. She has 7 siblings, 6 brothers and 1 sister. She reports her maternal grandmother was diagnosed with stomach cancer at age 41. Her brother was diagnosed with leukemia at age 80.   GYNECOLOGIC HISTORY:  No LMP recorded. Patient has had a hysterectomy. Menarche: 48 years old Age at first live birth: 48 years old GX P 2 (+1 stillborn) LMP age 29-42 Contraceptive: unsure, maybe for a year at age 59. HRT no  Hysterectomy? Yes, 05/2011 BSO? 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 She is married. Husband Sheppard Plumber") is a Hydrologist for a telephone company. She lives at home with her husband and two daughters. Daughter Suszanne Conners, age 93, is a Scientist, water quality. Daughter Jarrett Soho, age 12, is a Ship broker.     ADVANCED DIRECTIVES: Husband Heath Lark is her HCPOA.   HEALTH MAINTENANCE: Social History   Tobacco Use  . Smoking status: Never Smoker  . Smokeless tobacco: Never Used  Substance Use Topics  . Alcohol use: Yes    Alcohol/week: 2.0 standard drinks    Types: 2 Standard drinks or equivalent per week    Comment: "2-3 a week"  . Drug use: No     Colonoscopy: n/a  PAP: 02/2018  Bone density: n/a   No Known Allergies  Current Outpatient Medications  Medication Sig Dispense Refill  . Cholecalciferol (VITAMIN D3) 25 MCG (1000 UT) CHEW Chew by mouth.    . dexamethasone (DECADRON) 4 MG tablet Take 2 tablets by mouth once a day on the day after chemotherapy and then take 2 tablets two times a day for 2 days. Take with food. 30 tablet 1  . HYDROcodone-acetaminophen (NORCO/VICODIN) 5-325 MG tablet Take 1 tablet by mouth every 6 (six) hours as needed for moderate pain.  15 tablet 0  . ibuprofen (ADVIL) 800 MG tablet Take 1 tablet (800 mg total) by mouth every 8 (eight) hours as needed. 30 tablet 0  . lidocaine-prilocaine (EMLA) cream Apply to affected area once 30 g 3  . LORazepam (ATIVAN) 0.5 MG tablet Take 1 tablet (0.5 mg total) by mouth at bedtime as needed (Nausea or vomiting). 30 tablet 0  . olmesartan-hydrochlorothiazide (BENICAR HCT) 20-12.5 MG tablet Take 1 tablet by mouth daily.    Marland Kitchen oxyCODONE (OXY IR/ROXICODONE) 5 MG immediate release tablet Take 1 tablet (5 mg total) by mouth every 6 (six) hours as needed for severe pain. 15 tablet 0  . prochlorperazine (COMPAZINE) 10 MG tablet Take 1 tablet (10 mg total) by mouth every 6 (six) hours as needed  (Nausea or vomiting). 30 tablet 1   No current facility-administered medications for this visit.     OBJECTIVE: Morbidly obese white woman in no acute distress  Vitals:   07/24/19 0807  BP: (!) 106/41  Pulse: 77  Resp: 18  Temp: 98.9 F (37.2 C)  SpO2: 100%   Wt Readings from Last 3 Encounters:  07/24/19 223 lb 6.4 oz (101.3 kg)  07/17/19 218 lb 14.7 oz (99.3 kg)  06/21/19 225 lb (102.1 kg)   Body mass index is 42.21 kg/m.    ECOG FS:1 - Symptomatic but completely ambulatory  Ocular: Sclerae unicteric, pupils round and equal Ear-nose-throat: Wearing a mask Lymphatic: No cervical or supraclavicular adenopathy Lungs no rales or rhonchi Heart regular rate and rhythm Abd soft, nontender, positive bowel sounds MSK no focal spinal tenderness, no joint edema Neuro: non-focal, well-oriented, appropriate affect Breasts: Binder in place.  There is some ecchymosis on the left but no worrisome findings.  LAB RESULTS:  CMP     Component Value Date/Time   NA 137 07/13/2019 1230   K 4.3 07/13/2019 1230   CL 107 07/13/2019 1230   CO2 23 07/13/2019 1230   GLUCOSE 118 (H) 07/13/2019 1230   BUN 9 07/13/2019 1230   CREATININE 0.89 07/13/2019 1230   CREATININE 0.83 05/09/2019 1225   CALCIUM 9.3 07/13/2019 1230   PROT 6.7 06/08/2019 1212   ALBUMIN 3.7 06/08/2019 1212   AST 25 06/08/2019 1212   AST 18 05/09/2019 1225   ALT 39 06/08/2019 1212   ALT 23 05/09/2019 1225   ALKPHOS 48 06/08/2019 1212   BILITOT 1.0 06/08/2019 1212   BILITOT 0.7 05/09/2019 1225   GFRNONAA >60 07/13/2019 1230   GFRNONAA >60 05/09/2019 1225   GFRAA >60 07/13/2019 1230   GFRAA >60 05/09/2019 1225    No results found for: TOTALPROTELP, ALBUMINELP, A1GS, A2GS, BETS, BETA2SER, GAMS, MSPIKE, SPEI  No results found for: KPAFRELGTCHN, LAMBDASER, KAPLAMBRATIO  Lab Results  Component Value Date   WBC 10.4 07/24/2019   NEUTROABS 7.1 07/24/2019   HGB 14.3 07/24/2019   HCT 42.4 07/24/2019   MCV 87.1  07/24/2019   PLT 228 07/24/2019    _0 @  No results found for: LABCA2  No components found for: [id number]  No results for input(s): INR in the last 168 hours.  No results found for: LABCA2  No results found for: EQA834  No results found for: HDQ222  No results found for: LNL892  No results found for: CA2729  No components found for: HGQUANT  No results found for: CEA1 / No results found for: CEA1   No results found for: AFPTUMOR  No results found for: Jay  No results found for: PSA1  Appointment  on 07/24/2019  Component Date Value Ref Range Status  . WBC 07/24/2019 10.4  4.0 - 10.5 K/uL Final  . RBC 07/24/2019 4.87  3.87 - 5.11 MIL/uL Final  . Hemoglobin 07/24/2019 14.3  12.0 - 15.0 g/dL Final  . HCT 07/24/2019 42.4  36.0 - 46.0 % Final  . MCV 07/24/2019 87.1  80.0 - 100.0 fL Final  . MCH 07/24/2019 29.4  26.0 - 34.0 pg Final  . MCHC 07/24/2019 33.7  30.0 - 36.0 g/dL Final  . RDW 07/24/2019 12.0  11.5 - 15.5 % Final  . Platelets 07/24/2019 228  150 - 400 K/uL Final  . nRBC 07/24/2019 0.0  0.0 - 0.2 % Final  . Neutrophils Relative % 07/24/2019 67  % Final  . Neutro Abs 07/24/2019 7.1  1.7 - 7.7 K/uL Final  . Lymphocytes Relative 07/24/2019 21  % Final  . Lymphs Abs 07/24/2019 2.2  0.7 - 4.0 K/uL Final  . Monocytes Relative 07/24/2019 7  % Final  . Monocytes Absolute 07/24/2019 0.7  0.1 - 1.0 K/uL Final  . Eosinophils Relative 07/24/2019 4  % Final  . Eosinophils Absolute 07/24/2019 0.4  0.0 - 0.5 K/uL Final  . Basophils Relative 07/24/2019 0  % Final  . Basophils Absolute 07/24/2019 0.0  0.0 - 0.1 K/uL Final  . Immature Granulocytes 07/24/2019 1  % Final  . Abs Immature Granulocytes 07/24/2019 0.06  0.00 - 0.07 K/uL Final   Performed at [facility], [place] [address], [place], [place] [address]    (this displays the last labs from the last 3 days)  No results found for: TOTALPROTELP, ALBUMINELP, A1GS, A2GS, BETS,  BETA2SER, GAMS, MSPIKE, SPEI (this displays SPEP labs)  No results found for: KPAFRELGTCHN, LAMBDASER, KAPLAMBRATIO (kappa/lambda light chains)  No results found for: HGBA, HGBA2QUANT, HGBFQUANT, HGBSQUAN (Hemoglobinopathy evaluation)   No results found for: LDH  No results found for: IRON, TIBC, IRONPCTSAT (Iron and TIBC)  No results found for: FERRITIN  Urinalysis No results found for: COLORURINE, APPEARANCEUR, LABSPEC, PHURINE, GLUCOSEU, HGBUR, BILIRUBINUR, KETONESUR, PROTEINUR, UROBILINOGEN, NITRITE, LEUKOCYTESUR   STUDIES: No results found.  ELIGIBLE FOR AVAILABLE RESEARCH PROTOCOL: no  ASSESSMENT: 48 y.o. [place], [place] woman status post right breast upper outer quadrant biopsy 05/01/2019 for a clinical T1c N1, stage IIA invasive ductal carcinoma, grade 3, estrogen and progesterone receptor positive, HER-2 nonamplified, with an MIB-1-1 of 20%.  (a) mass in the axillary tail was a positive lymph node  (1) MammaPrint obtained from the original biopsy shows a high risk luminal subtype B tumor  (2) genetics testing 05/09/2019 through the Common Hereditary Gene Panel offered by [facility] found no deleterious mutations in APC, ATM, AXIN2, BARD1, BMPR1A, BRCA1, BRCA2, BRIP1, CDH1, CDK4, CDKN2A (p14ARF), CDKN2A (p16INK4a), CHEK2, CTNNA1, DICER1, EPCAM (Deletion/duplication testing only), GREM1 (promoter region deletion/duplication testing only), KIT, MEN1, MLH1, MSH2, MSH3, MSH6, MUTYH, NBN, NF1, NHTL1, PALB2, PDGFRA, PMS2, POLD1, POLE, PTEN, RAD50, RAD51C, RAD51D, RNF43, SDHB, SDHC, SDHD, SMAD4, SMARCA4. STK11, TP53, TSC1, TSC2, and VHL.  The following genes were evaluated for sequence changes only: SDHA and HOXB13 c.251G>A variant only.   (a) A variant of uncertain significance (VUS) was detected in one of her MSH6 genes (c.831A>C).  (3) status post right lumpectomy and sentinel lymph node sampling 06/12/2019 for a pT2 pN1, stage IIA invasive ductal carcinoma, grade 2, with positive  margins  (a) a total of 4 sentinel lymph nodes removed, one positive (with ECE), ine itc  (b) margin clearance 04/19/2019 successful  medial margin close butmet with our chemotherapy teaching nurse which reinforced these on today I again gave her a roadmap of her  Veronica [name], Virgie Dad, MD  07/24/19 8:27 AM negative for DCIS).  (4) adjuvant chemotherapy consisting of doxorubicin and cyclophosphamide in dose dense fashion x4 starting 07/24/2019, to be followed by weekly paclitaxel x12  (a) echo 06/26/2019 shows an ejection fraction in the 60-65% range  (5) adjuvant radiation to follow  (5) antiestrogens to start at the completion of local treatment  PLAN:  Veronica [name] is ready to start her adjuvant chemotherapy.  She has a good understanding of the possible toxicities side effects and complications.  She  Met with our chemotherapy teaching nurse and today gave her another roadmap on how to take her medications, and we reviewed it again so she has a good understanding of the plan.  She will return next week.  We will check nadir counts, make any adjustments for cycle 2 that are needed and then she will see [place] again on day 1 cycle 2.  She already has some caps so she is ready for when her hair falls out 2 weeks from now.  I have encouraged her to call us with any questions and not just hold onto questions until she comes to the next visit     Chauncey Cruel MD Medical Oncology and Hematology [facility] [place], [place] [address] Tel. [phone]    Fax. [phone]  I, Jacqualyn Posey am acting as a Education administrator for Chauncey Cruel, MD.   I, Lurline Del MD, have reviewed the above documentation for accuracy and completeness, and I agree with the above.

## 2019-07-24 NOTE — Telephone Encounter (Signed)
Spoke with patient on 1st cycle of A/C.  She states everything went well and she is resting.  Encouraged to call any needs or concerns.

## 2019-07-24 NOTE — Patient Instructions (Signed)
[name] Discharge Instructions for Patients Receiving Chemotherapy  Today you received the following chemotherapy agents doxorubicin, cytoxan.  To help prevent nausea and vomiting after your treatment, we encourage you to take your nausea medication.   If you develop nausea and vomiting that is not controlled by your nausea medication, call the clinic.   BELOW ARE SYMPTOMS THAT SHOULD BE REPORTED IMMEDIATELY:  *FEVER GREATER THAN 100.5 F  *CHILLS WITH OR WITHOUT FEVER  NAUSEA AND VOMITING THAT IS NOT CONTROLLED WITH YOUR NAUSEA MEDICATION  *UNUSUAL SHORTNESS OF BREATH  *UNUSUAL BRUISING OR BLEEDING  TENDERNESS IN MOUTH AND THROAT WITH OR WITHOUT PRESENCE OF ULCERS  *URINARY PROBLEMS  *BOWEL PROBLEMS  UNUSUAL RASH Items with * indicate a potential emergency and should be followed up as soon as possible.  Feel free to call the clinic should you have any questions or concerns. The clinic phone number is [phone].  Please show the [place] at check-in to the Emergency Department and triage nurse.  Cyclophosphamide injection What is this medicine? CYCLOPHOSPHAMIDE (sye kloe FOSS fa mide) is a chemotherapy drug. It slows the growth of cancer cells. This medicine is used to treat many types of cancer like lymphoma, myeloma, leukemia, breast cancer, and ovarian cancer, to name a few. This medicine may be used for other purposes; ask your health care provider or pharmacist if you have questions. COMMON BRAND NAME(S): Cytoxan, Neosar What should I tell my health care provider before I take this medicine? They need to know if you have any of these conditions:  blood disorders  history of other chemotherapy  infection  kidney disease  liver disease  recent or ongoing radiation therapy  tumors in the bone marrow  an unusual or allergic reaction to cyclophosphamide, other chemotherapy, other medicines, foods, dyes, or preservatives  pregnant  or trying to get pregnant  breast-feeding How should I use this medicine? This drug is usually given as an injection into a vein or muscle or by infusion into a vein. It is administered in a hospital or clinic by a specially trained health care professional. Talk to your pediatrician regarding the use of this medicine in children. Special care may be needed. Overdosage: If you think you have taken too much of this medicine contact a poison control center or emergency room at once. NOTE: This medicine is only for you. Do not share this medicine with others. What if I miss a dose? It is important not to miss your dose. Call your doctor or health care professional if you are unable to keep an appointment. What may interact with this medicine? This medicine may interact with the following medications:  amiodarone  amphotericin B  azathioprine  certain antiviral medicines for HIV or AIDS such as protease inhibitors (e.g., indinavir, ritonavir) and zidovudine  certain blood pressure medications such as benazepril, captopril, enalapril, fosinopril, lisinopril, moexipril, monopril, perindopril, quinapril, ramipril, trandolapril  certain cancer medications such as anthracyclines (e.g., daunorubicin, doxorubicin), busulfan, cytarabine, paclitaxel, pentostatin, tamoxifen, trastuzumab  certain diuretics such as chlorothiazide, chlorthalidone, hydrochlorothiazide, indapamide, metolazone  certain medicines that treat or prevent blood clots like warfarin  certain muscle relaxants such as succinylcholine  cyclosporine  etanercept  indomethacin  medicines to increase blood counts like filgrastim, pegfilgrastim, sargramostim  medicines used as general anesthesia  metronidazole  natalizumab This list may not describe all possible interactions. Give your health care provider a list of all the medicines, herbs, non-prescription drugs, or dietary supplements you use. Also  tell them if you  smoke, drink alcohol, or use illegal drugs. Some items may interact with your medicine. What should I watch for while using this medicine? Visit your doctor for checks on your progress. This drug may make you feel generally unwell. This is not uncommon, as chemotherapy can affect healthy cells as well as cancer cells. Report any side effects. Continue your course of treatment even though you feel ill unless your doctor tells you to stop. Drink water or other fluids as directed. Urinate often, even at night. In some cases, you may be given additional medicines to help with side effects. Follow all directions for their use. Call your doctor or health care professional for advice if you get a fever, chills or sore throat, or other symptoms of a cold or flu. Do not treat yourself. This drug decreases your body's ability to fight infections. Try to avoid being around people who are sick. This medicine may increase your risk to bruise or bleed. Call your doctor or health care professional if you notice any unusual bleeding. Be careful brushing and flossing your teeth or using a toothpick because you may get an infection or bleed more easily. If you have any dental work done, tell your dentist you are receiving this medicine. You may get drowsy or dizzy. Do not drive, use machinery, or do anything that needs mental alertness until you know how this medicine affects you. Do not become pregnant while taking this medicine or for 1 year after stopping it. Women should inform their doctor if they wish to become pregnant or think they might be pregnant. Men should not father a child while taking this medicine and for 4 months after stopping it. There is a potential for serious side effects to an unborn child. Talk to your health care professional or pharmacist for more information. Do not breast-feed an infant while taking this medicine. This medicine may interfere with the ability to have a child. This medicine has  caused ovarian failure in some women. This medicine has caused reduced sperm counts in some men. You should talk with your doctor or health care professional if you are concerned about your fertility. If you are going to have surgery, tell your doctor or health care professional that you have taken this medicine. What side effects may I notice from receiving this medicine? Side effects that you should report to your doctor or health care professional as soon as possible:  allergic reactions like skin rash, itching or hives, swelling of the face, lips, or tongue  low blood counts - this medicine may decrease the number of white blood cells, red blood cells and platelets. You may be at increased risk for infections and bleeding.  signs of infection - fever or chills, cough, sore throat, pain or difficulty passing urine  signs of decreased platelets or bleeding - bruising, pinpoint red spots on the skin, black, tarry stools, blood in the urine  signs of decreased red blood cells - unusually weak or tired, fainting spells, lightheadedness  breathing problems  dark urine  dizziness  palpitations  swelling of the ankles, feet, hands  trouble passing urine or change in the amount of urine  weight gain  yellowing of the eyes or skin Side effects that usually do not require medical attention (report to your doctor or health care professional if they continue or are bothersome):  changes in nail or skin color  hair loss  missed menstrual periods  mouth sores  nausea,  vomiting This list may not describe all possible side effects. Call your doctor for medical advice about side effects. You may report side effects to FDA at 1-800-FDA-1088. Where should I keep my medicine? This drug is given in a hospital or clinic and will not be stored at home. NOTE: This sheet is a summary. It may not cover all possible information. If you have questions about this medicine, talk to your doctor,  pharmacist, or health care provider.  2020 Elsevier/Gold Standard (2012-08-11 16:22:58) Doxorubicin injection What is this medicine? DOXORUBICIN (dox oh ROO bi sin) is a chemotherapy drug. It is used to treat many kinds of cancer like leukemia, lymphoma, neuroblastoma, sarcoma, and Wilms' tumor. It is also used to treat bladder cancer, breast cancer, lung cancer, ovarian cancer, stomach cancer, and thyroid cancer. This medicine may be used for other purposes; ask your health care provider or pharmacist if you have questions. COMMON BRAND NAME(S): Adriamycin, Adriamycin PFS, Adriamycin RDF, Rubex What should I tell my health care provider before I take this medicine? They need to know if you have any of these conditions:  heart disease  history of low blood counts caused by a medicine  liver disease  recent or ongoing radiation therapy  an unusual or allergic reaction to doxorubicin, other chemotherapy agents, other medicines, foods, dyes, or preservatives  pregnant or trying to get pregnant  breast-feeding How should I use this medicine? This drug is given as an infusion into a vein. It is administered in a hospital or clinic by a specially trained health care professional. If you have pain, swelling, burning or any unusual feeling around the site of your injection, tell your health care professional right away. Talk to your pediatrician regarding the use of this medicine in children. Special care may be needed. Overdosage: If you think you have taken too much of this medicine contact a poison control center or emergency room at once. NOTE: This medicine is only for you. Do not share this medicine with others. What if I miss a dose? It is important not to miss your dose. Call your doctor or health care professional if you are unable to keep an appointment. What may interact with this medicine? This medicine may interact with the following  medications:  6-mercaptopurine  paclitaxel  phenytoin  St. John's Wort  trastuzumab  verapamil This list may not describe all possible interactions. Give your health care provider a list of all the medicines, herbs, non-prescription drugs, or dietary supplements you use. Also tell them if you smoke, drink alcohol, or use illegal drugs. Some items may interact with your medicine. What should I watch for while using this medicine? This drug may make you feel generally unwell. This is not uncommon, as chemotherapy can affect healthy cells as well as cancer cells. Report any side effects. Continue your course of treatment even though you feel ill unless your doctor tells you to stop. There is a maximum amount of this medicine you should receive throughout your life. The amount depends on the medical condition being treated and your overall health. Your doctor will watch how much of this medicine you receive in your lifetime. Tell your doctor if you have taken this medicine before. You may need blood work done while you are taking this medicine. Your urine may turn red for a few days after your dose. This is not blood. If your urine is dark or brown, call your doctor. In some cases, you may be given additional medicines  to help with side effects. Follow all directions for their use. Call your doctor or health care professional for advice if you get a fever, chills or sore throat, or other symptoms of a cold or flu. Do not treat yourself. This drug decreases your body's ability to fight infections. Try to avoid being around people who are sick. This medicine may increase your risk to bruise or bleed. Call your doctor or health care professional if you notice any unusual bleeding. Talk to your doctor about your risk of cancer. You may be more at risk for certain types of cancers if you take this medicine. Do not become pregnant while taking this medicine or for 6 months after stopping it. Women  should inform their doctor if they wish to become pregnant or think they might be pregnant. Men should not father a child while taking this medicine and for 6 months after stopping it. There is a potential for serious side effects to an unborn child. Talk to your health care professional or pharmacist for more information. Do not breast-feed an infant while taking this medicine. This medicine has caused ovarian failure in some women and reduced sperm counts in some men This medicine may interfere with the ability to have a child. Talk with your doctor or health care professional if you are concerned about your fertility. This medicine may cause a decrease in Co-Enzyme Q-10. You should make sure that you get enough Co-Enzyme Q-10 while you are taking this medicine. Discuss the foods you eat and the vitamins you take with your health care professional. What side effects may I notice from receiving this medicine? Side effects that you should report to your doctor or health care professional as soon as possible:  allergic reactions like skin rash, itching or hives, swelling of the face, lips, or tongue  breathing problems  chest pain  fast or irregular heartbeat  low blood counts - this medicine may decrease the number of white blood cells, red blood cells and platelets. You may be at increased risk for infections and bleeding.  pain, redness, or irritation at site where injected  signs of infection - fever or chills, cough, sore throat, pain or difficulty passing urine  signs of decreased platelets or bleeding - bruising, pinpoint red spots on the skin, black, tarry stools, blood in the urine  swelling of the ankles, feet, hands  tiredness  weakness Side effects that usually do not require medical attention (report to your doctor or health care professional if they continue or are bothersome):  diarrhea  hair loss  mouth sores  nail discoloration or damage  nausea  red colored  urine  vomiting This list may not describe all possible side effects. Call your doctor for medical advice about side effects. You may report side effects to FDA at 1-800-FDA-1088. Where should I keep my medicine? This drug is given in a hospital or clinic and will not be stored at home. NOTE: This sheet is a summary. It may not cover all possible information. If you have questions about this medicine, talk to your doctor, pharmacist, or health care provider.  2020 Elsevier/Gold Standard (2017-05-11 11:01:26)

## 2019-07-30 ENCOUNTER — Encounter: Payer: Self-pay | Admitting: Oncology

## 2019-07-30 NOTE — Progress Notes (Signed)
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  I left a msg requesting she return my call at her earliest convenience.  °

## 2019-07-31 ENCOUNTER — Other Ambulatory Visit: Payer: Self-pay

## 2019-07-31 ENCOUNTER — Inpatient Hospital Stay (HOSPITAL_BASED_OUTPATIENT_CLINIC_OR_DEPARTMENT_OTHER): Payer: 59 | Admitting: Adult Health

## 2019-07-31 ENCOUNTER — Inpatient Hospital Stay: Payer: 59

## 2019-07-31 ENCOUNTER — Encounter: Payer: Self-pay | Admitting: Adult Health

## 2019-07-31 VITALS — BP 102/48 | HR 97 | Temp 98.9°F | Resp 18 | Ht 61.0 in | Wt 217.4 lb

## 2019-07-31 DIAGNOSIS — Z5111 Encounter for antineoplastic chemotherapy: Secondary | ICD-10-CM | POA: Diagnosis not present

## 2019-07-31 DIAGNOSIS — C50411 Malignant neoplasm of upper-outer quadrant of right female breast: Secondary | ICD-10-CM

## 2019-07-31 DIAGNOSIS — Z170 Estrogen receptor positive status [ER+]: Secondary | ICD-10-CM | POA: Diagnosis not present

## 2019-07-31 LAB — CBC WITH DIFFERENTIAL/PLATELET
Abs Immature Granulocytes: 0 10*3/uL (ref 0.00–0.07)
Basophils Absolute: 0 10*3/uL (ref 0.0–0.1)
Basophils Relative: 1 %
Eosinophils Absolute: 0.1 10*3/uL (ref 0.0–0.5)
Eosinophils Relative: 10 %
HCT: 39.9 % (ref 36.0–46.0)
Hemoglobin: 13.8 g/dL (ref 12.0–15.0)
Immature Granulocytes: 0 %
Lymphocytes Relative: 73 %
Lymphs Abs: 0.7 10*3/uL (ref 0.7–4.0)
MCH: 29.6 pg (ref 26.0–34.0)
MCHC: 34.6 g/dL (ref 30.0–36.0)
MCV: 85.6 fL (ref 80.0–100.0)
Monocytes Absolute: 0 10*3/uL — ABNORMAL LOW (ref 0.1–1.0)
Monocytes Relative: 4 %
Neutro Abs: 0.1 10*3/uL — CL (ref 1.7–7.7)
Neutrophils Relative %: 12 %
Platelets: 65 10*3/uL — ABNORMAL LOW (ref 150–400)
RBC: 4.66 MIL/uL (ref 3.87–5.11)
RDW: 11.8 % (ref 11.5–15.5)
WBC: 0.9 10*3/uL — CL (ref 4.0–10.5)
nRBC: 0 % (ref 0.0–0.2)

## 2019-07-31 LAB — COMPREHENSIVE METABOLIC PANEL
ALT: 32 U/L (ref 0–44)
AST: 12 U/L — ABNORMAL LOW (ref 15–41)
Albumin: 3 g/dL — ABNORMAL LOW (ref 3.5–5.0)
Alkaline Phosphatase: 72 U/L (ref 38–126)
Anion gap: 11 (ref 5–15)
BUN: 11 mg/dL (ref 6–20)
CO2: 21 mmol/L — ABNORMAL LOW (ref 22–32)
Calcium: 9.1 mg/dL (ref 8.9–10.3)
Chloride: 103 mmol/L (ref 98–111)
Creatinine, Ser: 0.77 mg/dL (ref 0.44–1.00)
GFR calc Af Amer: >60 mL/min (ref >60)
GFR calc non Af Amer: >60 mL/min (ref >60)
Glucose, Bld: 175 mg/dL — ABNORMAL HIGH (ref 70–99)
Potassium: 4 mmol/L (ref 3.5–5.1)
Sodium: 135 mmol/L (ref 135–145)
Total Bilirubin: 1.3 mg/dL — ABNORMAL HIGH (ref 0.3–1.2)
Total Protein: 6.5 g/dL (ref 6.5–8.1)

## 2019-07-31 MED ORDER — CIPROFLOXACIN HCL 500 MG PO TABS: 500.0000 mg | ORAL_TABLET | Freq: Two times a day (BID) | ORAL | 3 refills | Status: DC

## 2019-07-31 NOTE — Progress Notes (Signed)
WBC 0.9, Neutrophils 0.1 L. Shumate, RN notified via phone @ [phone]

## 2019-07-31 NOTE — Patient Instructions (Signed)
Constipation: Senokot S, you can take as many as two tablets twice a day.  Miralax daily as needed.    Indigestion: Pepcid twice a day.  We can consider Prilosec or nexium if the pepcid doesn't help.    You are neutropenic.  Wash hands frequently, follow strict pandemic precautions, take Cipro as prescribed for the next week. Call us immediately, day or night if you have a fever.    Ciprofloxacin tablets What is this medicine? CIPROFLOXACIN (sip roe FLOX a sin) is a quinolone antibiotic. It is used to treat certain kinds of bacterial infections. It will not work for colds, flu, or other viral infections. This medicine may be used for other purposes; ask your health care provider or pharmacist if you have questions. COMMON BRAND NAME(S): Cipro What should I tell my health care provider before I take this medicine? They need to know if you have any of these conditions:  bone problems  diabetes  heart disease  high blood pressure  history of irregular heartbeat  history of low levels of potassium in the blood  joint problems  kidney disease  liver disease  mental illness  myasthenia gravis  seizures  tendon problems  tingling of the fingers or toes, or other nerve disorder  an unusual or allergic reaction to ciprofloxacin, other antibiotics or medicines, foods, dyes, or preservatives  pregnant or trying to get pregnant  breast-feeding How should I use this medicine? Take this medicine by mouth with a full glass of water. Follow the directions on the prescription label. You can take it with or without food. If it upsets your stomach, take it with food. Take your medicine at regular intervals. Do not take your medicine more often than directed. Take all of your medicine as directed even if you think you are better. Do not skip doses or stop your medicine early. Avoid antacids, aluminum, calcium, iron, magnesium, and zinc products for 6 hours before and 2 hours after taking  a dose of this medicine. A special MedGuide will be given to you by the pharmacist with each prescription and refill. Be sure to read this information carefully each time. Talk to your pediatrician regarding the use of this medicine in children. Special care may be needed. Overdosage: If you think you have taken too much of this medicine contact a poison control center or emergency room at once. NOTE: This medicine is only for you. Do not share this medicine with others. What if I miss a dose? If you miss a dose, take it as soon as you can. If it is almost time for your next dose, take only that dose. Do not take double or extra doses. What may interact with this medicine? Do not take this medicine with any of the following medications:  cisapride  dronedarone  flibanserin  lomitapide  pimozide  thioridazine  tizanidine This medicine may also interact with the following medications:  antacids  birth control pills  caffeine  certain medicines for diabetes, like glipizide, glyburide, or insulin  certain medicines that treat or prevent blood clots like warfarin  clozapine  cyclosporine  didanosine buffered tablets or powder  dofetilide  duloxetine  lanthanum carbonate  lidocaine  methotrexate  multivitamins  NSAIDS, medicines for pain and inflammation, like ibuprofen or naproxen  olanzapine  omeprazole  other medicines that prolong the QT interval (cause an abnormal heart rhythm)  phenytoin  probenecid  ropinirole  sevelamer  sildenafil  sucralfate  theophylline  ziprasidone  zolpidem This  list may not describe all possible interactions. Give your health care provider a list of all the medicines, herbs, non-prescription drugs, or dietary supplements you use. Also tell them if you smoke, drink alcohol, or use illegal drugs. Some items may interact with your medicine. What should I watch for while using this medicine? Tell your doctor or  health care provider if your symptoms do not start to get better or if they get worse. This medicine may cause serious skin reactions. They can happen weeks to months after starting the medicine. Contact your health care provider right away if you notice fevers or flu-like symptoms with a rash. The rash may be red or purple and then turn into blisters or peeling of the skin. Or, you might notice a red rash with swelling of the face, lips or lymph nodes in your neck or under your arms. Do not treat diarrhea with over the counter products. Contact your doctor if you have diarrhea that lasts more than 2 days or if it is severe and watery. Check with your doctor or health care provider if you get an attack of severe diarrhea, nausea and vomiting, or if you sweat a lot. The loss of too much body fluid can make it dangerous for you to take this medicine. This medicine may increase blood sugar. Ask your health care provider if changes in diet or medicines are needed if you have diabetes. You may get drowsy or dizzy. Do not drive, use machinery, or do anything that needs mental alertness until you know how this medicine affects you. Do not sit or stand up quickly, especially if you are an older patient. This reduces the risk of dizzy or fainting spells. This medicine can make you more sensitive to the sun. Keep out of the sun. If you cannot avoid being in the sun, wear protective clothing and use sunscreen. Do not use sun lamps or tanning beds/booths. What side effects may I notice from receiving this medicine? Side effects that you should report to your doctor or health care professional as soon as possible:  allergic reactions like skin rash or hives, swelling of the face, lips, or tongue  anxious  bloody or watery diarrhea  confusion  depressed mood  fast, irregular heartbeat  fever  hallucination, loss of contact with reality  joint, muscle, or tendon pain or swelling  loss of memory  pain,  tingling, numbness in the hands or feet  redness, blistering, peeling or loosening of the skin, including inside the mouth  seizures  signs and symptoms of aortic dissection such as sudden chest, stomach, or back pain  signs and symptoms of high blood sugar such as being more thirsty or hungry or having to urinate more than normal. You may also feel very tired or have blurry vision.  signs and symptoms of liver injury like dark yellow or brown urine; general ill feeling or flu-like symptoms; light-colored stools; loss of appetite; nausea; right upper belly pain; unusually weak or tired; yellowing of the eyes or skin  signs and symptoms of low blood sugar such as feeling anxious; confusion; dizziness; increased hunger; unusually weak or tired; sweating; shakiness; cold; irritable; headache; blurred vision; fast heartbeat; loss of consciousness; pale skin  suicidal thoughts or other mood changes  sunburn  unusually weak or tired Side effects that usually do not require medical attention (report to your doctor or health care professional if they continue or are bothersome):  dry mouth  headache  nausea  trouble  sleeping This list may not describe all possible side effects. Call your doctor for medical advice about side effects. You may report side effects to FDA at 1-800-FDA-1088. Where should I keep my medicine? Keep out of the reach of children. Store at room temperature below 30 degrees C (86 degrees F). Keep container tightly closed. Throw away any unused medicine after the expiration date. NOTE: This sheet is a summary. It may not cover all possible information. If you have questions about this medicine, talk to your doctor, pharmacist, or health care provider.  2020 Elsevier/Gold Standard (2018-12-28 11:26:08)

## 2019-07-31 NOTE — Progress Notes (Signed)
[facility]  [phone] [phone]     ID: Veronica [name] DOB: Dec 04, 1970  MR#: [id number]  MGN#:[id number]  Patient Care Team: Veronica [name], Veronica [name] as PCP - General (Physician Assistant) Veronica Kaufmann, RN as Oncology Nurse Navigator Veronica Germany, RN as Oncology Nurse Navigator Veronica Luna, MD as Consulting Physician (General Surgery) Veronica [name], Veronica Dad, MD as Consulting Physician (Oncology) Veronica Rudd, MD as Consulting Physician (Radiation Oncology) Veronica Dock, NP OTHER MD:  CHIEF COMPLAINT: Estrogen receptor positive breast cancer  CURRENT TREATMENT:  adjuvant chemotherapy   INTERVAL HISTORY: Veronica [name] returns today for follow-up and treatment of her estrogen receptor positive breast cancer.   She is receiving adjuvant chemotherapy consisting of doxorubicin and cyclophosphamide in dose dense fashion x4 to be followed by weekly paclitaxel x12.  Today is day 8 cycle 1 of AC chemotherapy, with Neulasta given in form on Onpro.     REVIEW OF SYSTEMS: Veronica [name] is doing moderately well today.  She notes that her two main issues following treatment include constipation and reflux.  She says the constipation started afterward, and she took miralax for it, and then she had some more difficulty.  She did have some mild stomach aching due to this.  She says the reflux started the night after chemotherapy. Rolaids helped after the first night, however subsequently she felt as if fire was going all up her throat.  She notes that this indigestion has resolved.    Veronica [name] is fatigued.  She says this began last weekend.  She says it is slowly improving, and she says that it does take longer to do things.  She says she is eating and drinking ok.  She notes some mild abdominal pain/cramping after eating a grilled cheese sandwich, and after that she has started eating softer things.  She had nausea yesterday morning once, and this resolved with compazine.    She has  had no fever, chills, chest pain, palpitations, cough, shortness of breath, bladder concerns, skin issues.  A detailed ROS was otherwise non contributory.    HISTORY OF CURRENT ILLNESS: From the original intake note:  Veronica [name] had routine screening mammography on 04/27/2019 showing a possible abnormality in the right breast. She underwent right diagnostic mammography with tomography and right breast ultrasonography at [facility] on 05/01/2019 showing: breast density category C; highly suspicious 1.7 cm irregular mass in the right breast at 10 o'clock, 8 cm from the nipple; indeterminate hypoechoic round mass in the right axillary tail/low axilla. Physical exam showed no suspicious lumps.  Accordingly on 05/02/2019 she proceeded to biopsy of the right breast area in question. The pathology from this procedure ([id number]) showed: invasive ductal carcinoma, grade 3; ductal carcinoma in situ; lymphovascular invasion present. Prognostic indicators significant for: estrogen receptor, 30% positive with moderate staining intensity, and progesterone receptor, 30% positive with strong staining intensity. Proliferation marker Ki67 at 20%. HER2 negative by immunohistochemistry (1+).  The second "satellite" lesion was a lymph node which was also positive.  The patient's subsequent history is as detailed below.   PAST MEDICAL HISTORY: Past Medical History:  Diagnosis Date  . Cancer ([place])   . Chronic low back pain with left-sided sciatica   . Family history of leukemia   . Family history of stomach cancer   . Hypertension     PAST SURGICAL HISTORY: Past Surgical History:  Procedure Laterality Date  . BREAST BIOPSY Right 05/02/2019   right clips X2  . BREAST LUMPECTOMY  WITH RADIOACTIVE SEED AND SENTINEL LYMPH NODE BIOPSY Right 06/12/2019   Procedure: RIGHT BREAST RADIOACTIVE SEED LUMPECTOMY  AND RIGHT AXILLARY SEED TARGETED LYMPH NODE AND AXILLARY SENTINEL LYMPH NODE MAPPING;  Surgeon:  Veronica Luna, MD;  Location: [facility];  Service: General;  Laterality: Right;  PEC BLOCK  . C/S x2  '98 '03  . CESAREAN SECTION    . CHOLECYSTECTOMY    . LAPAROSCOPIC ASSISTED VAGINAL HYSTERECTOMY  05/17/2011   Procedure: LAPAROSCOPIC ASSISTED VAGINAL HYSTERECTOMY;  Surgeon: Veronica [name];  Location: [facility];  Service: Gynecology;  Laterality: N/A;  Laparoscopic Assisted Vaginal Hysterectomy With Lysis Of Adhesions  . PORTACATH PLACEMENT Right 06/12/2019   Procedure: INSERTION PORT-A-CATH WITH ULTRASOUND;  Surgeon: Veronica Luna, MD;  Location: [facility];  Service: General;  Laterality: Right;  . RE-EXCISION OF BREAST LUMPECTOMY Right 07/17/2019   Procedure: RE-EXCISION OF RIGHT BREAST LUMPECTOMY;  Surgeon: Veronica Luna, MD;  Location: [facility];  Service: General;  Laterality: Right;  . TUBAL LIGATION      FAMILY HISTORY: Family History  Problem Relation Age of Onset  . Arthritis Mother   . COPD Mother   . Hypertension Mother   . Hyperlipidemia Mother   . Leukemia Brother 63  . Stomach cancer Maternal Grandmother        late 42s   Patient's parents are living (as of September 2020). Her father is 67 and her mother is 3 as of 04/2019. The patient denies a family hx of breast or ovarian cancer. She has 7 siblings, 6 brothers and 1 sister. She reports her maternal grandmother was diagnosed with stomach cancer at age 82. Her brother was diagnosed with leukemia at age 21.   GYNECOLOGIC HISTORY:  No LMP recorded. Patient has had a hysterectomy. Menarche: 48 years old Age at first live birth: 48 years old GX P 2 (+1 stillborn) LMP age [age] Contraceptive: unsure, maybe for a year at age 44. HRT no Hysterectomy? Yes, 05/2011 BSO? 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 She is married. Husband Veronica [name]") is a Hydrologist for a telephone company. She lives at home with her  husband and two daughters. Daughter Veronica [name], age 95, is a Scientist, water quality. Daughter Veronica [name], age 59, is a Ship broker.     ADVANCED DIRECTIVES: Husband Veronica [name] is her HCPOA.   HEALTH MAINTENANCE: Social History   Tobacco Use  . Smoking status: Never Smoker  . Smokeless tobacco: Never Used  Substance Use Topics  . Alcohol use: Yes    Alcohol/week: 2.0 standard drinks    Types: 2 Standard drinks or equivalent per week    Comment: "2-3 a week"  . Drug use: No     Colonoscopy: n/a  PAP: 02/2018  Bone density: n/a   No Known Allergies  Current Outpatient Medications  Medication Sig Dispense Refill  . Cholecalciferol (VITAMIN D3) 25 MCG (1000 UT) CHEW Chew by mouth.    . dexamethasone (DECADRON) 4 MG tablet Take 2 tablets by mouth once a day on the day after chemotherapy and then take 2 tablets two times a day for 2 days. Take with food. 30 tablet 1  . HYDROcodone-acetaminophen (NORCO/VICODIN) 5-325 MG tablet Take 1 tablet by mouth every 6 (six) hours as needed for moderate pain. 15 tablet 0  . ibuprofen (ADVIL) 800 MG tablet Take 1 tablet (800 mg total) by mouth every 8 (eight) hours as needed. 30 tablet 0  . lidocaine-prilocaine (  EMLA) cream Apply to affected area once 30 g 3  . LORazepam (ATIVAN) 0.5 MG tablet Take 1 tablet (0.5 mg total) by mouth at bedtime as needed (Nausea or vomiting). 30 tablet 0  . olmesartan-hydrochlorothiazide (BENICAR HCT) 20-12.5 MG tablet Take 1 tablet by mouth daily.    Marland Kitchen oxyCODONE (OXY IR/ROXICODONE) 5 MG immediate release tablet Take 1 tablet (5 mg total) by mouth every 6 (six) hours as needed for severe pain. 15 tablet 0  . prochlorperazine (COMPAZINE) 10 MG tablet Take 1 tablet (10 mg total) by mouth every 6 (six) hours as needed (Nausea or vomiting). 30 tablet 1   No current facility-administered medications for this visit.     OBJECTIVE: Morbidly obese white woman in no acute distress  There were no vitals filed for this visit. Wt Readings from Last 3  Encounters:  07/24/19 223 lb 6.4 oz (101.3 kg)  07/17/19 218 lb 14.7 oz (99.3 kg)  06/21/19 225 lb (102.1 kg)   There is no height or weight on file to calculate BMI.    ECOG FS:1 - Symptomatic but completely ambulatory GENERAL: Patient is a well appearing female in no acute distress HEENT:  Sclerae anicteric.  Oropharynx clear and moist. No ulcerations or evidence of oropharyngeal candidiasis. Neck is supple.  NODES:  No cervical, supraclavicular, or axillary lymphadenopathy palpated.  BREAST EXAM:  Right breast s/p lumpectomy, healing well, slight swelling at axillary site, improving.   LUNGS:  Clear to auscultation bilaterally.  No wheezes or rhonchi. HEART:  Regular rate and rhythm. No murmur appreciated. ABDOMEN:  Soft, nontender.  Positive, normoactive bowel sounds. No organomegaly palpated. MSK:  No focal spinal tenderness to palpation. Full range of motion bilaterally in the upper extremities. EXTREMITIES:  No peripheral edema.   SKIN:  Clear with no obvious rashes or skin changes. No nail dyscrasia. NEURO:  Nonfocal. Well oriented.  Appropriate affect.    LAB RESULTS:  CMP     Component Value Date/Time   NA 139 07/24/2019 0802   K 3.7 07/24/2019 0802   CL 104 07/24/2019 0802   CO2 24 07/24/2019 0802   GLUCOSE 144 (H) 07/24/2019 0802   BUN 10 07/24/2019 0802   CREATININE 0.78 07/24/2019 0802   CREATININE 0.83 05/09/2019 1225   CALCIUM 9.4 07/24/2019 0802   PROT 6.8 07/24/2019 0802   ALBUMIN 3.6 07/24/2019 0802   AST 14 (L) 07/24/2019 0802   AST 18 05/09/2019 1225   ALT 20 07/24/2019 0802   ALT 23 05/09/2019 1225   ALKPHOS 57 07/24/2019 0802   BILITOT 0.8 07/24/2019 0802   BILITOT 0.7 05/09/2019 1225   GFRNONAA >60 07/24/2019 0802   GFRNONAA >60 05/09/2019 1225   GFRAA >60 07/24/2019 0802   GFRAA >60 05/09/2019 1225    No results found for: TOTALPROTELP, ALBUMINELP, A1GS, A2GS, BETS, BETA2SER, GAMS, MSPIKE, SPEI  No results found for: KPAFRELGTCHN,  LAMBDASER, KAPLAMBRATIO  Lab Results  Component Value Date   WBC 10.4 07/24/2019   NEUTROABS 7.1 07/24/2019   HGB 14.3 07/24/2019   HCT 42.4 07/24/2019   MCV 87.1 07/24/2019   PLT 228 07/24/2019    '@LASTCHEMISTRY'$ @  No results found for: LABCA2  No components found for: XMIWOE321  No results for input(s): INR in the last 168 hours.  No results found for: LABCA2  No results found for: YYQ825  No results found for: OIB704  No results found for: UGQ916  No results found for: CA2729  No components found for: HGQUANT  No results found for: CEA1 / No results found for: CEA1   No results found for: AFPTUMOR  No results found for: CHROMOGRNA  No results found for: PSA1  No visits with results within 3 Day(s) from this visit.  Latest known visit with results is:  Appointment on 07/24/2019  Component Date Value Ref Range Status  . WBC 07/24/2019 10.4  4.0 - 10.5 K/uL Final  . RBC 07/24/2019 4.87  3.87 - 5.11 MIL/uL Final  . Hemoglobin 07/24/2019 14.3  12.0 - 15.0 g/dL Final  . HCT 07/24/2019 42.4  36.0 - 46.0 % Final  . MCV 07/24/2019 87.1  80.0 - 100.0 fL Final  . MCH 07/24/2019 29.4  26.0 - 34.0 pg Final  . MCHC 07/24/2019 33.7  30.0 - 36.0 g/dL Final  . RDW 07/24/2019 12.0  11.5 - 15.5 % Final  . Platelets 07/24/2019 228  150 - 400 K/uL Final  . nRBC 07/24/2019 0.0  0.0 - 0.2 % Final  . Neutrophils Relative % 07/24/2019 67  % Final  . Neutro Abs 07/24/2019 7.1  1.7 - 7.7 K/uL Final  . Lymphocytes Relative 07/24/2019 21  % Final  . Lymphs Abs 07/24/2019 2.2  0.7 - 4.0 K/uL Final  . Monocytes Relative 07/24/2019 7  % Final  . Monocytes Absolute 07/24/2019 0.7  0.1 - 1.0 K/uL Final  . Eosinophils Relative 07/24/2019 4  % Final  . Eosinophils Absolute 07/24/2019 0.4  0.0 - 0.5 K/uL Final  . Basophils Relative 07/24/2019 0  % Final  . Basophils Absolute 07/24/2019 0.0  0.0 - 0.1 K/uL Final  . Immature Granulocytes 07/24/2019 1  % Final  . Abs Immature Granulocytes  07/24/2019 0.06  0.00 - 0.07 K/uL Final   Performed at [facility], [place] [address] [place], [place] [address]  . Sodium 07/24/2019 139  135 - 145 mmol/L Final  . Potassium 07/24/2019 3.7  3.5 - 5.1 mmol/L Final  . Chloride 07/24/2019 104  98 - 111 mmol/L Final  . CO2 07/24/2019 24  22 - 32 mmol/L Final  . Glucose, Bld 07/24/2019 144* 70 - 99 mg/dL Final  . BUN 07/24/2019 10  6 - 20 mg/dL Final  . Creatinine, Ser 07/24/2019 0.78  0.44 - 1.00 mg/dL Final  . Calcium 07/24/2019 9.4  8.9 - 10.3 mg/dL Final  . Total Protein 07/24/2019 6.8  6.5 - 8.1 g/dL Final  . Albumin 07/24/2019 3.6  3.5 - 5.0 g/dL Final  . AST 07/24/2019 14* 15 - 41 U/L Final  . ALT 07/24/2019 20  0 - 44 U/L Final  . Alkaline Phosphatase 07/24/2019 57  38 - 126 U/L Final  . Total Bilirubin 07/24/2019 0.8  0.3 - 1.2 mg/dL Final  . GFR calc non Af Amer 07/24/2019 >60  >60 mL/min Final  . GFR calc Af Amer 07/24/2019 >60  >60 mL/min Final  . Anion gap 07/24/2019 11  5 - 15 Final   Performed at [facility], [place] [address], [place], [place] [address]    (this displays the last labs from the last 3 days)  No results found for: TOTALPROTELP, ALBUMINELP, A1GS, A2GS, BETS, BETA2SER, GAMS, MSPIKE, SPEI (this displays SPEP labs)  No results found for: KPAFRELGTCHN, LAMBDASER, KAPLAMBRATIO (kappa/lambda light chains)  No results found for: HGBA, HGBA2QUANT, HGBFQUANT, HGBSQUAN (Hemoglobinopathy evaluation)   No results found for: LDH  No results found for: IRON, TIBC, IRONPCTSAT (Iron and TIBC)  No results found for: FERRITIN  Urinalysis No results found for: COLORURINE, APPEARANCEUR, LABSPEC, PHURINE, GLUCOSEU, HGBUR, BILIRUBINUR, KETONESUR, PROTEINUR, UROBILINOGEN, NITRITE, LEUKOCYTESUR   STUDIES: No results found.  ELIGIBLE FOR AVAILABLE RESEARCH PROTOCOL: no  ASSESSMENT: 48 y.o. [place], [place] woman status post right breast upper outer quadrant biopsy  05/01/2019 for a clinical T1c N1, stage IIA invasive ductal carcinoma, grade 3, estrogen and progesterone receptor positive, HER-2 nonamplified, with an MIB-1-1 of 20%.  (a) mass in the axillary tail was a positive lymph node  (1) MammaPrint obtained from the original biopsy shows a high risk luminal subtype B tumor  (2) genetics testing 05/09/2019 through the Common Hereditary Gene Panel offered by [facility] found no deleterious mutations in APC, ATM, AXIN2, BARD1, BMPR1A, BRCA1, BRCA2, BRIP1, CDH1, CDK4, CDKN2A (p14ARF), CDKN2A (p16INK4a), CHEK2, CTNNA1, DICER1, EPCAM (Deletion/duplication testing only), GREM1 (promoter region deletion/duplication testing only), KIT, MEN1, MLH1, MSH2, MSH3, MSH6, MUTYH, NBN, NF1, NHTL1, PALB2, PDGFRA, PMS2, POLD1, POLE, PTEN, RAD50, RAD51C, RAD51D, RNF43, SDHB, SDHC, SDHD, SMAD4, SMARCA4. STK11, TP53, TSC1, TSC2, and VHL.  The following genes were evaluated for sequence changes only: SDHA and HOXB13 c.251G>A variant only.   (a) A variant of uncertain significance (VUS) was detected in one of her MSH6 genes (c.831A>C).  (3) status post right lumpectomy and sentinel lymph node sampling 06/12/2019 for a pT2 pN1, stage IIA invasive ductal carcinoma, grade 2, with positive margins  (a) a total of 4 sentinel lymph nodes removed, one positive (with ECE), ine itc  (b) margin clearance 04/19/2019 successful medial margin close but negative for DCIS   (4) adjuvant chemotherapy will consist of doxorubicin and cyclophosphamide in dose dense fashion x4 starting 07/10/2019 followed by weekly paclitaxel x12  (a) echo 06/26/2019 shows an EF of 60-65%  (5) adjuvant radiation to follow  (5) antiestrogens to start at the completion of local treatment  PLAN:  Azalya is doing well today.  She tolerated her chemotherapy well, and had minimal adverse effects, which we can easily treat.  She is neutropenic. She received Onpro.  This will rebound, however in the interim she was given  neutropenic precuations in detail and will take cipro 538m po bid.  This order was placed today and she was given information in her AVS about Cipro.    Indigestion: she was recommend to take Pepcid BID, to start anytime, but no later than the day prior to her chemotherapy which is 10/26.  Should this not work, reducing the dexamethasone, and/or starting omeprazole daily can be considered.    Constipation: I gave her a bowel regimen that recommended Senokot S 1-2 tablets daily to twice a day.  She can take Miralax daily PRN.    Overall, I am happy with how she tolerated treatment.  She will return on 08/07/2019 for labs, f/u, and her second cycle of Doxorbucin and cyclophosphamide with onpro support.  She was recommended to continue with the appropriate pandemic precautions. She knows to call for any questions that may arise between now and her next appointment.  We are happy to see her sooner if needed.   A total of (30) minutes of face-to-face time was spent with this patient with greater than 50% of that time in counseling and care-coordination.    LWilber Bihari NP Medical Oncology and Hematology [facility] [address]Tel. [phone]   Fax. [phone]

## 2019-08-07 ENCOUNTER — Other Ambulatory Visit: Payer: Self-pay

## 2019-08-07 ENCOUNTER — Inpatient Hospital Stay: Payer: 59

## 2019-08-07 ENCOUNTER — Encounter: Payer: Self-pay | Admitting: Adult Health

## 2019-08-07 ENCOUNTER — Inpatient Hospital Stay (HOSPITAL_BASED_OUTPATIENT_CLINIC_OR_DEPARTMENT_OTHER): Payer: 59 | Admitting: Adult Health

## 2019-08-07 VITALS — BP 104/73 | HR 83 | Temp 98.7°F | Resp 18 | Ht 61.0 in | Wt 218.4 lb

## 2019-08-07 DIAGNOSIS — C50411 Malignant neoplasm of upper-outer quadrant of right female breast: Secondary | ICD-10-CM

## 2019-08-07 DIAGNOSIS — Z170 Estrogen receptor positive status [ER+]: Secondary | ICD-10-CM | POA: Diagnosis not present

## 2019-08-07 DIAGNOSIS — Z95828 Presence of other vascular implants and grafts: Secondary | ICD-10-CM

## 2019-08-07 DIAGNOSIS — Z5111 Encounter for antineoplastic chemotherapy: Secondary | ICD-10-CM | POA: Diagnosis not present

## 2019-08-07 LAB — CBC WITH DIFFERENTIAL/PLATELET
Abs Immature Granulocytes: 2.88 10*3/uL — ABNORMAL HIGH (ref 0.00–0.07)
Basophils Absolute: 0.1 10*3/uL (ref 0.0–0.1)
Basophils Relative: 1 %
Eosinophils Absolute: 0 10*3/uL (ref 0.0–0.5)
Eosinophils Relative: 0 %
HCT: 38.1 % (ref 36.0–46.0)
Hemoglobin: 13 g/dL (ref 12.0–15.0)
Immature Granulocytes: 28 %
Lymphocytes Relative: 16 %
Lymphs Abs: 1.6 10*3/uL (ref 0.7–4.0)
MCH: 29.3 pg (ref 26.0–34.0)
MCHC: 34.1 g/dL (ref 30.0–36.0)
MCV: 86 fL (ref 80.0–100.0)
Monocytes Absolute: 0.7 10*3/uL (ref 0.1–1.0)
Monocytes Relative: 7 %
Neutro Abs: 5.2 10*3/uL (ref 1.7–7.7)
Neutrophils Relative %: 48 %
Platelets: 219 10*3/uL (ref 150–400)
RBC: 4.43 MIL/uL (ref 3.87–5.11)
RDW: 12 % (ref 11.5–15.5)
WBC: 10.5 10*3/uL (ref 4.0–10.5)
nRBC: 0.2 % (ref 0.0–0.2)

## 2019-08-07 LAB — COMPREHENSIVE METABOLIC PANEL
ALT: 34 U/L (ref 0–44)
AST: 17 U/L (ref 15–41)
Albumin: 3.3 g/dL — ABNORMAL LOW (ref 3.5–5.0)
Alkaline Phosphatase: 78 U/L (ref 38–126)
Anion gap: 10 (ref 5–15)
BUN: 9 mg/dL (ref 6–20)
CO2: 23 mmol/L (ref 22–32)
Calcium: 9.2 mg/dL (ref 8.9–10.3)
Chloride: 106 mmol/L (ref 98–111)
Creatinine, Ser: 0.76 mg/dL (ref 0.44–1.00)
GFR calc Af Amer: >60 mL/min (ref >60)
GFR calc non Af Amer: >60 mL/min (ref >60)
Glucose, Bld: 151 mg/dL — ABNORMAL HIGH (ref 70–99)
Potassium: 3.7 mmol/L (ref 3.5–5.1)
Sodium: 139 mmol/L (ref 135–145)
Total Bilirubin: 0.4 mg/dL (ref 0.3–1.2)
Total Protein: 6.4 g/dL — ABNORMAL LOW (ref 6.5–8.1)

## 2019-08-07 MED ORDER — PALONOSETRON HCL INJECTION 0.25 MG/5ML
0.2500 mg | Freq: Once | INTRAVENOUS | Status: AC
  Administered 2019-08-07: 0.25 mg via INTRAVENOUS

## 2019-08-07 MED ORDER — SODIUM CHLORIDE 0.9% FLUSH
10.0000 mL | INTRAVENOUS | Status: DC | PRN
  Administered 2019-08-07: 10 mL
  Filled 2019-08-07: qty 10

## 2019-08-07 MED ORDER — SODIUM CHLORIDE 0.9 % IV SOLN
600.0000 mg/m2 | Freq: Once | INTRAVENOUS | Status: AC
  Administered 2019-08-07: 1260 mg via INTRAVENOUS
  Filled 2019-08-07: qty 63

## 2019-08-07 MED ORDER — CIPROFLOXACIN HCL 500 MG PO TABS: 500.0000 mg | ORAL_TABLET | Freq: Two times a day (BID) | ORAL | 3 refills | Status: DC

## 2019-08-07 MED ORDER — SODIUM CHLORIDE 0.9% FLUSH
10.0000 mL | INTRAVENOUS | Status: DC | PRN
  Administered 2019-08-07: 09:00:00 10 mL
  Filled 2019-08-07: qty 10

## 2019-08-07 MED ORDER — SODIUM CHLORIDE 0.9 % IV SOLN
Freq: Once | INTRAVENOUS | Status: AC
  Administered 2019-08-07: 10:00:00 via INTRAVENOUS
  Filled 2019-08-07: qty 250

## 2019-08-07 MED ORDER — PEGFILGRASTIM 6 MG/0.6ML ~~LOC~~ PSKT
6.0000 mg | PREFILLED_SYRINGE | Freq: Once | SUBCUTANEOUS | Status: AC
  Administered 2019-08-07: 6 mg via SUBCUTANEOUS

## 2019-08-07 MED ORDER — HEPARIN SOD (PORK) LOCK FLUSH 100 UNIT/ML IV SOLN
500.0000 [IU] | Freq: Once | INTRAVENOUS | Status: AC | PRN
  Administered 2019-08-07: 500 [IU]
  Filled 2019-08-07: qty 5

## 2019-08-07 MED ORDER — SODIUM CHLORIDE 0.9 % IV SOLN
Freq: Once | INTRAVENOUS | Status: AC
  Administered 2019-08-07: 11:00:00 via INTRAVENOUS
  Filled 2019-08-07: qty 5

## 2019-08-07 MED ORDER — PALONOSETRON HCL INJECTION 0.25 MG/5ML
INTRAVENOUS | Status: AC
  Filled 2019-08-07: qty 5

## 2019-08-07 MED ORDER — DOXORUBICIN HCL CHEMO IV INJECTION 2 MG/ML
60.0000 mg/m2 | Freq: Once | INTRAVENOUS | Status: AC
  Administered 2019-08-07: 126 mg via INTRAVENOUS
  Filled 2019-08-07: qty 63

## 2019-08-07 MED ORDER — PEGFILGRASTIM 6 MG/0.6ML ~~LOC~~ PSKT
PREFILLED_SYRINGE | SUBCUTANEOUS | Status: AC
  Filled 2019-08-07: qty 0.6

## 2019-08-07 MED ORDER — SODIUM CHLORIDE 0.9% FLUSH
10.0000 mL | INTRAVENOUS | Status: DC | PRN
  Filled 2019-08-07: qty 10

## 2019-08-07 NOTE — Patient Instructions (Signed)

## 2019-08-07 NOTE — Patient Instructions (Signed)
[facility] Discharge Instructions for Patients Receiving Chemotherapy  Today you received the following chemotherapy agents: Adriamycin, Cytoxan  To help prevent nausea and vomiting after your treatment, we encourage you to take your nausea medication as directed.   If you develop nausea and vomiting that is not controlled by your nausea medication, call the clinic.   BELOW ARE SYMPTOMS THAT SHOULD BE REPORTED IMMEDIATELY:  *FEVER GREATER THAN 100.5 F  *CHILLS WITH OR WITHOUT FEVER  NAUSEA AND VOMITING THAT IS NOT CONTROLLED WITH YOUR NAUSEA MEDICATION  *UNUSUAL SHORTNESS OF BREATH  *UNUSUAL BRUISING OR BLEEDING  TENDERNESS IN MOUTH AND THROAT WITH OR WITHOUT PRESENCE OF ULCERS  *URINARY PROBLEMS  *BOWEL PROBLEMS  UNUSUAL RASH Items with * indicate a potential emergency and should be followed up as soon as possible.  Feel free to call the clinic should you have any questions or concerns. The clinic phone number is [phone].  Please show the CHEMO ALERT CARD at check-in to the Emergency Department and triage nurse.   

## 2019-08-07 NOTE — Progress Notes (Signed)
[facility]  [phone] [phone]     ID: Veronica [name] DOB: 1971-09-15  MR#: [id number]  XTK#:[id number]  Patient Care Team: Blair Heys, Hershal Coria as PCP - General (Physician Assistant) Mauro Kaufmann, RN as Oncology Nurse Navigator [name], RN as Oncology Nurse Navigator Erroll Luna, MD as Consulting Physician (General Surgery) Magrinat, Virgie Dad, MD as Consulting Physician (Oncology) Kyung Rudd, MD as Consulting Physician (Radiation Oncology) Scot Dock, NP OTHER MD:  CHIEF COMPLAINT: Estrogen receptor positive breast cancer  CURRENT TREATMENT:  adjuvant chemotherapy   INTERVAL HISTORY: Nalanie returns today for follow-up and treatment of her estrogen receptor positive breast cancer.   She is receiving adjuvant chemotherapy consisting of doxorubicin and cyclophosphamide in dose dense fashion x4 to be followed by weekly paclitaxel x12.  Today is day 1 cycle 2 of AC chemotherapy, with Neulasta given in form on Onpro.      REVIEW OF SYSTEMS: [name] is doing moderately well today.  She took the Cipro as directed and tolerated this well.  She was experiencing indigestion at her last appointment and started taking Pepcid.  The indigestion has resolved.  She has had a good week.  She denies any new issues such as fever, chills, chest pain, palpitations, cough, bowel/bladder issues, nausea, vomiting.  A detailed ROS was otherwise non contributory.     HISTORY OF CURRENT ILLNESS: From the original intake note:  Veronica [name] had routine screening mammography on 04/27/2019 showing a possible abnormality in the right breast. She underwent right diagnostic mammography with tomography and right breast ultrasonography at [facility] on 05/01/2019 showing: breast density category C; highly suspicious 1.7 cm irregular mass in the right breast at 10 o'clock, 8 cm from the nipple; indeterminate hypoechoic round mass in the right axillary  tail/low axilla. Physical exam showed no suspicious lumps.  Accordingly on 05/02/2019 she proceeded to biopsy of the right breast area in question. The pathology from this procedure ([id number]) showed: invasive ductal carcinoma, grade 3; ductal carcinoma in situ; lymphovascular invasion present. Prognostic indicators significant for: estrogen receptor, 30% positive with moderate staining intensity, and progesterone receptor, 30% positive with strong staining intensity. Proliferation marker Ki67 at 20%. HER2 negative by immunohistochemistry (1+).  The second "satellite" lesion was a lymph node which was also positive.  The patient's subsequent history is as detailed below.   PAST MEDICAL HISTORY: Past Medical History:  Diagnosis Date   Cancer ([place])    Chronic low back pain with left-sided sciatica    Family history of leukemia    Family history of stomach cancer    Hypertension     PAST SURGICAL HISTORY: Past Surgical History:  Procedure Laterality Date   BREAST BIOPSY Right 05/02/2019   right clips X2   BREAST LUMPECTOMY WITH RADIOACTIVE SEED AND SENTINEL LYMPH NODE BIOPSY Right 06/12/2019   Procedure: RIGHT BREAST RADIOACTIVE SEED LUMPECTOMY  AND RIGHT AXILLARY SEED TARGETED LYMPH NODE AND AXILLARY SENTINEL LYMPH NODE MAPPING;  Surgeon: Erroll Luna, MD;  Location: [facility];  Service: General;  Laterality: Right;  PEC BLOCK   C/S x2  '98 '[place]  05/17/2011   Procedure: LAPAROSCOPIC ASSISTED VAGINAL HYSTERECTOMY;  Surgeon: Sharene Butters;  Location: [facility];  Service: Gynecology;  Laterality: N/A;  Laparoscopic Assisted Vaginal Hysterectomy With Lysis Of Adhesions   PORTACATH PLACEMENT Right 06/12/2019   Procedure: INSERTION PORT-A-CATH  WITH ULTRASOUND;  Surgeon: Erroll Luna, MD;  Location: [facility];  Service: General;  Laterality: Right;   RE-EXCISION  OF BREAST LUMPECTOMY Right 07/17/2019   Procedure: RE-EXCISION OF RIGHT BREAST LUMPECTOMY;  Surgeon: Erroll Luna, MD;  Location: [facility];  Service: General;  Laterality: Right;   TUBAL LIGATION      FAMILY HISTORY: Family History  Problem Relation Age of Onset   Arthritis Mother    COPD Mother    Hypertension Mother    Hyperlipidemia Mother    Leukemia Brother 7   Stomach cancer Maternal Grandmother        late 38s   Patient's parents are living (as of September 2020). Her father is 94 and her mother is 80 as of 04/2019. The patient denies a family hx of breast or ovarian cancer. She has 7 siblings, 6 brothers and 1 sister. She reports her maternal grandmother was diagnosed with stomach cancer at age 10. Her brother was diagnosed with leukemia at age 14.   GYNECOLOGIC HISTORY:  No LMP recorded. Patient has had a hysterectomy. Menarche: 48 years old Age at first live birth: 48 years old GX P 2 (+1 stillborn) LMP age [age] Contraceptive: unsure, maybe for a year at age 5. HRT no Hysterectomy? Yes, 05/2011 BSO? 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 She is married. Husband Sheppard Plumber") is a Hydrologist for a telephone company. She lives at home with her husband and two daughters. Daughter Veronica [name], age 76, is a Scientist, water quality. Daughter Veronica [name], age 52, is a Ship broker.     ADVANCED DIRECTIVES: Husband Heath Lark is her HCPOA.   HEALTH MAINTENANCE: Social History   Tobacco Use   Smoking status: Never Smoker   Smokeless tobacco: Never Used  Substance Use Topics   Alcohol use: Yes    Alcohol/week: 2.0 standard drinks    Types: 2 Standard drinks or equivalent per week    Comment: "2-3 a week"   Drug use: No     Colonoscopy: n/a  PAP: 02/2018  Bone density: n/a   No Known Allergies  Current Outpatient Medications  Medication Sig Dispense Refill   acetaminophen (TYLENOL) 500 MG tablet Take 500 mg  by mouth every 6 (six) hours as needed.     Cholecalciferol (VITAMIN D3) 25 MCG (1000 UT) CHEW Chew by mouth.     ciprofloxacin (CIPRO) 500 MG tablet Take 1 tablet (500 mg total) by mouth 2 (two) times daily. 14 tablet 3   dexamethasone (DECADRON) 4 MG tablet Take 2 tablets by mouth once a day on the day after chemotherapy and then take 2 tablets two times a day for 2 days. Take with food. 30 tablet 1   lidocaine-prilocaine (EMLA) cream Apply to affected area once 30 g 3   LORazepam (ATIVAN) 0.5 MG tablet Take 1 tablet (0.5 mg total) by mouth at bedtime as needed (Nausea or vomiting). 30 tablet 0   olmesartan-hydrochlorothiazide (BENICAR HCT) 20-12.5 MG tablet Take 1 tablet by mouth daily.     prochlorperazine (COMPAZINE) 10 MG tablet Take 1 tablet (10 mg total) by mouth every 6 (six) hours as needed (Nausea or vomiting). 30 tablet 1   No current facility-administered medications for this visit.     OBJECTIVE: Morbidly obese white woman in no acute distress  Vitals:   08/07/19 0909  BP: 104/73  Pulse: 83  Resp: 18  Temp: 98.7 F (37.1 C)  SpO2: 100%  Wt Readings from Last 3 Encounters:  08/07/19 218 lb 6.4 oz (99.1 kg)  07/31/19 217 lb 6.4 oz (98.6 kg)  07/24/19 223 lb 6.4 oz (101.3 kg)   Body mass index is 41.27 kg/m.    ECOG FS:1 - Symptomatic but completely ambulatory GENERAL: Patient is a well appearing female in no acute distress HEENT:  Sclerae anicteric.  Oropharynx clear and moist. No ulcerations or evidence of oropharyngeal candidiasis. Neck is supple.  NODES:  No cervical, supraclavicular, or axillary lymphadenopathy palpated.  BREAST EXAM:  Deferred  LUNGS:  Clear to auscultation bilaterally.  No wheezes or rhonchi. HEART:  Regular rate and rhythm. No murmur appreciated. ABDOMEN:  Soft, nontender.  Positive, normoactive bowel sounds. No organomegaly palpated. MSK:  No focal spinal tenderness to palpation. Full range of motion bilaterally in the upper  extremities. EXTREMITIES:  No peripheral edema.   SKIN:  Clear with no obvious rashes or skin changes. No nail dyscrasia. NEURO:  Nonfocal. Well oriented.  Appropriate affect.    LAB RESULTS:  CMP     Component Value Date/Time   NA 139 08/07/2019 0840   K 3.7 08/07/2019 0840   CL 106 08/07/2019 0840   CO2 23 08/07/2019 0840   GLUCOSE 151 (H) 08/07/2019 0840   BUN 9 08/07/2019 0840   CREATININE 0.76 08/07/2019 0840   CREATININE 0.83 05/09/2019 1225   CALCIUM 9.2 08/07/2019 0840   PROT 6.4 (L) 08/07/2019 0840   ALBUMIN 3.3 (L) 08/07/2019 0840   AST 17 08/07/2019 0840   AST 18 05/09/2019 1225   ALT 34 08/07/2019 0840   ALT 23 05/09/2019 1225   ALKPHOS 78 08/07/2019 0840   BILITOT 0.4 08/07/2019 0840   BILITOT 0.7 05/09/2019 1225   GFRNONAA >60 08/07/2019 0840   GFRNONAA >60 05/09/2019 1225   GFRAA >60 08/07/2019 0840   GFRAA >60 05/09/2019 1225    No results found for: TOTALPROTELP, ALBUMINELP, A1GS, A2GS, BETS, BETA2SER, GAMS, MSPIKE, SPEI  No results found for: KPAFRELGTCHN, LAMBDASER, KAPLAMBRATIO  Lab Results  Component Value Date   WBC 10.5 08/07/2019   NEUTROABS 5.2 08/07/2019   HGB 13.0 08/07/2019   HCT 38.1 08/07/2019   MCV 86.0 08/07/2019   PLT 219 08/07/2019    '@LASTCHEMISTRY' @  No results found for: LABCA2  No components found for: [id number]  No results for input(s): INR in the last 168 hours.  No results found for: LABCA2  No results found for: ATF573  No results found for: UKG254  No results found for: YHC623  No results found for: CA2729  No components found for: HGQUANT  No results found for: CEA1 / No results found for: CEA1   No results found for: AFPTUMOR  No results found for: CHROMOGRNA  No results found for: PSA1  Appointment on 08/07/2019  Component Date Value Ref Range Status   Sodium 08/07/2019 139  135 - 145 mmol/L Final   Potassium 08/07/2019 3.7  3.5 - 5.1 mmol/L Final   Chloride 08/07/2019 106  98 - 111  mmol/L Final   CO2 08/07/2019 23  22 - 32 mmol/L Final   Glucose, Bld 08/07/2019 151* 70 - 99 mg/dL Final   BUN 08/07/2019 9  6 - 20 mg/dL Final   Creatinine, Ser 08/07/2019 0.76  0.44 - 1.00 mg/dL Final   Calcium 08/07/2019 9.2  8.9 - 10.3 mg/dL Final   Total Protein 08/07/2019 6.4* 6.5 - 8.1 g/dL Final   Albumin 08/07/2019 3.3* 3.5 - 5.0 g/dL Final  AST 08/07/2019 17  15 - 41 U/L Final   ALT 08/07/2019 34  0 - 44 U/L Final   Alkaline Phosphatase 08/07/2019 78  38 - 126 U/L Final   Total Bilirubin 08/07/2019 0.4  0.3 - 1.2 mg/dL Final   GFR calc non Af Amer 08/07/2019 >60  >60 mL/min Final   GFR calc Af Amer 08/07/2019 >60  >60 mL/min Final   Anion gap 08/07/2019 10  5 - 15 Final   Performed at [facility], [place] [address], [place], [place] [address]   WBC 08/07/2019 10.5  4.0 - 10.5 K/uL Final   RBC 08/07/2019 4.43  3.87 - 5.11 MIL/uL Final   Hemoglobin 08/07/2019 13.0  12.0 - 15.0 g/dL Final   HCT 08/07/2019 38.1  36.0 - 46.0 % Final   MCV 08/07/2019 86.0  80.0 - 100.0 fL Final   MCH 08/07/2019 29.3  26.0 - 34.0 pg Final   MCHC 08/07/2019 34.1  30.0 - 36.0 g/dL Final   RDW 08/07/2019 12.0  11.5 - 15.5 % Final   Platelets 08/07/2019 219  150 - 400 K/uL Final   nRBC 08/07/2019 0.2  0.0 - 0.2 % Final   Neutrophils Relative % 08/07/2019 48  % Final   Neutro Abs 08/07/2019 5.2  1.7 - 7.7 K/uL Final   Lymphocytes Relative 08/07/2019 16  % Final   Lymphs Abs 08/07/2019 1.6  0.7 - 4.0 K/uL Final   Monocytes Relative 08/07/2019 7  % Final   Monocytes Absolute 08/07/2019 0.7  0.1 - 1.0 K/uL Final   Eosinophils Relative 08/07/2019 0  % Final   Eosinophils Absolute 08/07/2019 0.0  0.0 - 0.5 K/uL Final   Basophils Relative 08/07/2019 1  % Final   Basophils Absolute 08/07/2019 0.1  0.0 - 0.1 K/uL Final   Immature Granulocytes 08/07/2019 28  % Final   Increased IG's, likely caused by Bone Marrow Colony Stimulating Factor received  within 30 days.   Abs Immature Granulocytes 08/07/2019 2.88* 0.00 - 0.07 K/uL Final   Polychromasia 08/07/2019 PRESENT   Final   Performed at [facility], [place] [address] [place], [place] [address]    (this displays the last labs from the last 3 days)  No results found for: TOTALPROTELP, ALBUMINELP, A1GS, A2GS, BETS, BETA2SER, GAMS, MSPIKE, SPEI (this displays SPEP labs)  No results found for: KPAFRELGTCHN, LAMBDASER, KAPLAMBRATIO (kappa/lambda light chains)  No results found for: HGBA, HGBA2QUANT, HGBFQUANT, HGBSQUAN (Hemoglobinopathy evaluation)   No results found for: LDH  No results found for: IRON, TIBC, IRONPCTSAT (Iron and TIBC)  No results found for: FERRITIN  Urinalysis No results found for: COLORURINE, APPEARANCEUR, LABSPEC, PHURINE, GLUCOSEU, HGBUR, BILIRUBINUR, KETONESUR, PROTEINUR, UROBILINOGEN, NITRITE, LEUKOCYTESUR   STUDIES: No results found.  ELIGIBLE FOR AVAILABLE RESEARCH PROTOCOL: no  ASSESSMENT: 48 y.o. [place], [place] woman status post right breast upper outer quadrant biopsy 05/01/2019 for a clinical T1c N1, stage IIA invasive ductal carcinoma, grade 3, estrogen and progesterone receptor positive, HER-2 nonamplified, with an MIB-1-1 of 20%.  (a) mass in the axillary tail was a positive lymph node  (1) MammaPrint obtained from the original biopsy shows a high risk luminal subtype B tumor  (2) genetics testing 05/09/2019 through the Common Hereditary Gene Panel offered by [facility] found no deleterious mutations in APC, ATM, AXIN2, BARD1, BMPR1A, BRCA1, BRCA2, BRIP1, CDH1, CDK4, CDKN2A (p14ARF), CDKN2A (p16INK4a), CHEK2, CTNNA1, DICER1, EPCAM (Deletion/duplication testing only), GREM1 (promoter region deletion/duplication testing only), KIT, MEN1, MLH1, MSH2,  MSH3, MSH6, MUTYH, NBN, NF1, NHTL1, PALB2, PDGFRA, PMS2, POLD1, POLE, PTEN, RAD50, RAD51C, RAD51D, RNF43, SDHB, SDHC, SDHD, SMAD4, SMARCA4. STK11, TP53, TSC1, TSC2, and VHL.  The  following genes were evaluated for sequence changes only: SDHA and HOXB13 c.251G>A variant only.   (a) A variant of uncertain significance (VUS) was detected in one of her MSH6 genes (c.831A>C).  (3) status post right lumpectomy and sentinel lymph node sampling 06/12/2019 for a pT2 pN1, stage IIA invasive ductal carcinoma, grade 2, with positive margins  (a) a total of 4 sentinel lymph nodes removed, one positive (with ECE), ine itc  (b) margin clearance 04/19/2019 successful medial margin close but negative for DCIS   (4) adjuvant chemotherapy will consist of doxorubicin and cyclophosphamide in dose dense fashion x4 starting 07/10/2019 followed by weekly paclitaxel x12  (a) echo 06/26/2019 shows an EF of 60-65%  (5) adjuvant radiation to follow  (5) antiestrogens to start at the completion of local treatment  PLAN:  Kailynn is doing well today.  Her labs are stable and I reviewed them with her in detail.  Her indigestion is improved.  She will proceed with cycle 2 of Doxorubicin and Cyclophosphamide with Onpro support.    Zamyiah and I discussed next week.  Since she tolerated chemotherapy very well, she will start the Cipro in one week.  She has refills on this and understands how to take it, and to call if she needs to be seen.    Lamira will return in 2 weeks for labs, f/u, and cycle three of her chemotherapy.  She was recommended to continue with the appropriate pandemic precautions. She knows to call for any questions that may arise between now and her next appointment.  We are happy to see her sooner if needed.   A total of (20) minutes of face-to-face time was spent with this patient with greater than 50% of that time in counseling and care-coordination.    Wilber Bihari, NP Medical Oncology and Hematology [facility] [place], [place] [address] Tel. [phone]    Fax. [phone]

## 2019-08-16 ENCOUNTER — Encounter: Payer: Self-pay | Admitting: Oncology

## 2019-08-16 ENCOUNTER — Telehealth: Payer: Self-pay | Admitting: *Deleted

## 2019-08-16 NOTE — Telephone Encounter (Signed)
This RN spoke to the patient per her call stating " it feel like my pills are getting stuck in my chest ".  She states above started yesterday but now is occurring more continuously " even if I swallow water ".  This RN discussed above likely secondary to decadron- she has pepcid in the home but is currently not using it ( she states she was advised to only use for 4 days 1st treatment when she had severe heartburn.  This RN advised her to resume pepcid at 1 tablet twice a day for 7 days and then to continue 1 tablet daily through out her treatment.  This RN informed pt if above not beneficial to call again for further evaluation.

## 2019-08-20 NOTE — Progress Notes (Signed)
[facility]  [phone] [phone]     ID: [name] DOB: 08/23/1971  MR#: [id number]  UUV#:[id number]  Patient Care Team: Blair Heys, Hershal Coria as PCP - General (Physician Assistant) Mauro Kaufmann, RN as Oncology Nurse Navigator [name], RN as Oncology Nurse Navigator Erroll Luna, MD as Consulting Physician (General Surgery) Kartik Fernando, Virgie Dad, MD as Consulting Physician (Oncology) Kyung Rudd, MD as Consulting Physician (Radiation Oncology) Chauncey Cruel, MD OTHER MD:  CHIEF COMPLAINT: Estrogen receptor positive breast cancer  CURRENT TREATMENT:  adjuvant chemotherapy   INTERVAL HISTORY: Veronica [name] returns today for follow-up and treatment of her estrogen receptor positive breast cancer.   She is receiving adjuvant chemotherapy consisting of doxorubicin and cyclophosphamide in dose dense fashion x4, to be followed by weekly paclitaxel x12.  Today is day 1 cycle 3 doxorubicin and cyclophosphamide, with Onpro support.    She contacted our office on 08/16/2019 with concerns regarding food and pills "getting stuck in my chest." She reported it progressed to include swallowing water. RN Val advised the patient to use pepcid.  That helped a little but Ellen still had vomiting x2.  This was approximately day 9 from her second cycle.   REVIEW OF SYSTEMS: [name] is generally doing quite well with her treatments.  She walks between 1 and 3 miles most days unless it is raining outside.  She is staying home because of the pandemic but occasionally she goes out to [employer] just for variety.  Her family is very careful regarding bringing any virus home.  Recall one of her daughters  and her husband both work outside the home.  She is having mild hemorrhoidal discomfort, with no hard stools.  Aside from these issues a detailed review of systems today was otherwise stable.   HISTORY OF CURRENT ILLNESS: From the original intake note:  Veronica [name] had  routine screening mammography on 04/27/2019 showing a possible abnormality in the right breast. She underwent right diagnostic mammography with tomography and right breast ultrasonography at [facility] on 05/01/2019 showing: breast density category C; highly suspicious 1.7 cm irregular mass in the right breast at 10 o'clock, 8 cm from the nipple; indeterminate hypoechoic round mass in the right axillary tail/low axilla. Physical exam showed no suspicious lumps.  Accordingly on 05/02/2019 she proceeded to biopsy of the right breast area in question. The pathology from this procedure ([id number]) showed: invasive ductal carcinoma, grade 3; ductal carcinoma in situ; lymphovascular invasion present. Prognostic indicators significant for: estrogen receptor, 30% positive with moderate staining intensity, and progesterone receptor, 30% positive with strong staining intensity. Proliferation marker Ki67 at 20%. HER2 negative by immunohistochemistry (1+).  The second "satellite" lesion was a lymph node which was also positive.  The patient's subsequent history is as detailed below.   PAST MEDICAL HISTORY: Past Medical History:  Diagnosis Date  . Cancer ([place])   . Chronic low back pain with left-sided sciatica   . Family history of leukemia   . Family history of stomach cancer   . Hypertension     PAST SURGICAL HISTORY: Past Surgical History:  Procedure Laterality Date  . BREAST BIOPSY Right 05/02/2019   right clips X2  . BREAST LUMPECTOMY WITH RADIOACTIVE SEED AND SENTINEL LYMPH NODE BIOPSY Right 06/12/2019   Procedure: RIGHT BREAST RADIOACTIVE SEED LUMPECTOMY  AND RIGHT AXILLARY SEED TARGETED LYMPH NODE AND AXILLARY SENTINEL LYMPH NODE MAPPING;  Surgeon: Erroll Luna, MD;  Location: [facility];  Service:  General;  Laterality: Right;  PEC BLOCK  . C/S x2  '98 '03  . CESAREAN SECTION    . CHOLECYSTECTOMY    . LAPAROSCOPIC ASSISTED VAGINAL HYSTERECTOMY  05/17/2011   Procedure:  LAPAROSCOPIC ASSISTED VAGINAL HYSTERECTOMY;  Surgeon: Sharene Butters;  Location: [facility];  Service: Gynecology;  Laterality: N/A;  Laparoscopic Assisted Vaginal Hysterectomy With Lysis Of Adhesions  . PORTACATH PLACEMENT Right 06/12/2019   Procedure: INSERTION PORT-A-CATH WITH ULTRASOUND;  Surgeon: Erroll Luna, MD;  Location: [facility];  Service: General;  Laterality: Right;  . RE-EXCISION OF BREAST LUMPECTOMY Right 07/17/2019   Procedure: RE-EXCISION OF RIGHT BREAST LUMPECTOMY;  Surgeon: Erroll Luna, MD;  Location: [facility];  Service: General;  Laterality: Right;  . TUBAL LIGATION      FAMILY HISTORY: Family History  Problem Relation Age of Onset  . Arthritis Mother   . COPD Mother   . Hypertension Mother   . Hyperlipidemia Mother   . Leukemia Brother 22  . Stomach cancer Maternal Grandmother        late 28s   Patient's parents are living (as of September 2020). Her father is 64 and her mother is 46 as of 04/2019. The patient denies a family hx of breast or ovarian cancer. She has 7 siblings, 6 brothers and 1 sister. She reports her maternal grandmother was diagnosed with stomach cancer at age 21. Her brother was diagnosed with leukemia at age 4.   GYNECOLOGIC HISTORY:  No LMP recorded. Patient has had a hysterectomy. Menarche: 48 years old Age at first live birth: 48 years old GX P 2 (+1 stillborn) LMP age 46-42 Contraceptive: unsure, maybe for a year at age 66. HRT no Hysterectomy? Yes, 05/2011 BSO? 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 She is married. Husband Sheppard Plumber") is a Hydrologist for a telephone company. She lives at home with her husband and two daughters. Daughter Suszanne Conners, age 27, is a Scientist, water quality. Daughter Jarrett Soho, age 35, is a Ship broker.     ADVANCED DIRECTIVES: Husband Heath Lark is her HCPOA.   HEALTH MAINTENANCE: Social History   Tobacco Use  . Smoking status: Never  Smoker  . Smokeless tobacco: Never Used  Substance Use Topics  . Alcohol use: Yes    Alcohol/week: 2.0 standard drinks    Types: 2 Standard drinks or equivalent per week    Comment: "2-3 a week"  . Drug use: No     Colonoscopy: n/a  PAP: 02/2018  Bone density: n/a   No Known Allergies  Current Outpatient Medications  Medication Sig Dispense Refill  . acetaminophen (TYLENOL) 500 MG tablet Take 500 mg by mouth every 6 (six) hours as needed.    . Cholecalciferol (VITAMIN D3) 25 MCG (1000 UT) CHEW Chew by mouth.    . ciprofloxacin (CIPRO) 500 MG tablet Take 1 tablet (500 mg total) by mouth 2 (two) times daily. 14 tablet 3  . dexamethasone (DECADRON) 4 MG tablet Take 2 tablets by mouth once a day on the day after chemotherapy and then take 2 tablets two times a day for 2 days. Take with food. 30 tablet 1  . lidocaine-prilocaine (EMLA) cream Apply to affected area once 30 g 3  . LORazepam (ATIVAN) 0.5 MG tablet Take 1 tablet (0.5 mg total) by mouth at bedtime as needed (Nausea or vomiting). 30 tablet 0  . olmesartan-hydrochlorothiazide (BENICAR HCT) 20-12.5 MG tablet Take 1 tablet by mouth daily.    Marland Kitchen  ondansetron (ZOFRAN) 8 MG tablet Take 1 tablet (8 mg total) by mouth every 8 (eight) hours as needed for nausea or vomiting. 20 tablet 0  . prochlorperazine (COMPAZINE) 10 MG tablet Take 1 tablet (10 mg total) by mouth every 6 (six) hours as needed (Nausea or vomiting). 30 tablet 1   No current facility-administered medications for this visit.     OBJECTIVE: Morbidly obese white woman who appears stated age  48:   08/21/19 0856  BP: 130/84  Pulse: 100  Resp: 16  Temp: 98.2 F (36.8 C)  SpO2: 100%   Wt Readings from Last 3 Encounters:  08/21/19 220 lb 4.8 oz (99.9 kg)  08/07/19 218 lb 6.4 oz (99.1 kg)  07/31/19 217 lb 6.4 oz (98.6 kg)   Body mass index is 41.63 kg/m.    ECOG FS:1 - Symptomatic but completely ambulatory  Sclerae unicteric, EOMs intact Wearing a mask No  cervical or supraclavicular adenopathy Lungs no rales or rhonchi Heart regular rate and rhythm Abd soft, nontender, positive bowel sounds MSK no focal spinal tenderness, no upper extremity lymphedema Neuro: nonfocal, well oriented, appropriate affect Breasts: Status post bilateral mastectomies.  No evidence of local recurrence.  There is minimal erythema of the lateral and of the right-sided incision.  No dehiscence.  Both axillae are benign.   LAB RESULTS:  CMP     Component Value Date/Time   NA 141 08/21/2019 0845   K 3.3 (L) 08/21/2019 0845   CL 105 08/21/2019 0845   CO2 24 08/21/2019 0845   GLUCOSE 205 (H) 08/21/2019 0845   BUN 7 08/21/2019 0845   CREATININE 0.71 08/21/2019 0845   CREATININE 0.83 05/09/2019 1225   CALCIUM 8.8 (L) 08/21/2019 0845   PROT 5.9 (L) 08/21/2019 0845   ALBUMIN 3.3 (L) 08/21/2019 0845   AST 14 (L) 08/21/2019 0845   AST 18 05/09/2019 1225   ALT 18 08/21/2019 0845   ALT 23 05/09/2019 1225   ALKPHOS 70 08/21/2019 0845   BILITOT 0.6 08/21/2019 0845   BILITOT 0.7 05/09/2019 1225   GFRNONAA >60 08/21/2019 0845   GFRNONAA >60 05/09/2019 1225   GFRAA >60 08/21/2019 0845   GFRAA >60 05/09/2019 1225    No results found for: TOTALPROTELP, ALBUMINELP, A1GS, A2GS, BETS, BETA2SER, GAMS, MSPIKE, SPEI  No results found for: KPAFRELGTCHN, LAMBDASER, KAPLAMBRATIO  Lab Results  Component Value Date   WBC 12.8 (H) 08/21/2019   NEUTROABS PENDING 08/21/2019   HGB 10.1 (L) 08/21/2019   HCT 30.1 (L) 08/21/2019   MCV 87.0 08/21/2019   PLT 166 08/21/2019    '@LASTCHEMISTRY' @  No results found for: LABCA2  No components found for: UMPNTI144  No results for input(s): INR in the last 168 hours.  No results found for: LABCA2  No results found for: RXV400  No results found for: QQP619  No results found for: JKD326  No results found for: CA2729  No components found for: HGQUANT  No results found for: CEA1 / No results found for: CEA1   No results  found for: AFPTUMOR  No results found for: CHROMOGRNA  No results found for: PSA1  Appointment on 08/21/2019  Component Date Value Ref Range Status  . Sodium 08/21/2019 141  135 - 145 mmol/L Final  . Potassium 08/21/2019 3.3* 3.5 - 5.1 mmol/L Final  . Chloride 08/21/2019 105  98 - 111 mmol/L Final  . CO2 08/21/2019 24  22 - 32 mmol/L Final  . Glucose, Bld 08/21/2019 205* 70 - 99 mg/dL  Final  . BUN 08/21/2019 7  6 - 20 mg/dL Final  . Creatinine, Ser 08/21/2019 0.71  0.44 - 1.00 mg/dL Final  . Calcium 08/21/2019 8.8* 8.9 - 10.3 mg/dL Final  . Total Protein 08/21/2019 5.9* 6.5 - 8.1 g/dL Final  . Albumin 08/21/2019 3.3* 3.5 - 5.0 g/dL Final  . AST 08/21/2019 14* 15 - 41 U/L Final  . ALT 08/21/2019 18  0 - 44 U/L Final  . Alkaline Phosphatase 08/21/2019 70  38 - 126 U/L Final  . Total Bilirubin 08/21/2019 0.6  0.3 - 1.2 mg/dL Final  . GFR calc non Af Amer 08/21/2019 >60  >60 mL/min Final  . GFR calc Af Amer 08/21/2019 >60  >60 mL/min Final  . Anion gap 08/21/2019 12  5 - 15 Final   Performed at [facility], [place] [address], [place], [place] [address]  . WBC 08/21/2019 12.8* 4.0 - 10.5 K/uL Final  . RBC 08/21/2019 3.46* 3.87 - 5.11 MIL/uL Final  . Hemoglobin 08/21/2019 10.1* 12.0 - 15.0 g/dL Final  . HCT 08/21/2019 30.1* 36.0 - 46.0 % Final  . MCV 08/21/2019 87.0  80.0 - 100.0 fL Final  . MCH 08/21/2019 29.2  26.0 - 34.0 pg Final  . MCHC 08/21/2019 33.6  30.0 - 36.0 g/dL Final  . RDW 08/21/2019 12.4  11.5 - 15.5 % Final  . Platelets 08/21/2019 166  150 - 400 K/uL Final  . nRBC 08/21/2019 0.9* 0.0 - 0.2 % Final   Performed at [facility], [place] [address] [place], [place] [address]  . Neutrophils Relative % 08/21/2019 PENDING  % Incomplete  . Neutro Abs 08/21/2019 PENDING  1.7 - 7.7 K/uL Incomplete  . Band Neutrophils 08/21/2019 PENDING  % Incomplete  . Lymphocytes Relative 08/21/2019 PENDING  % Incomplete  . Lymphs Abs 08/21/2019  PENDING  0.7 - 4.0 K/uL Incomplete  . Monocytes Relative 08/21/2019 PENDING  % Incomplete  . Monocytes Absolute 08/21/2019 PENDING  0.1 - 1.0 K/uL Incomplete  . Eosinophils Relative 08/21/2019 PENDING  % Incomplete  . Eosinophils Absolute 08/21/2019 PENDING  0.0 - 0.5 K/uL Incomplete  . Basophils Relative 08/21/2019 PENDING  % Incomplete  . Basophils Absolute 08/21/2019 PENDING  0.0 - 0.1 K/uL Incomplete  . WBC Morphology 08/21/2019 PENDING   Incomplete  . RBC Morphology 08/21/2019 PENDING   Incomplete  . Smear Review 08/21/2019 PENDING   Incomplete  . Other 08/21/2019 PENDING  % Incomplete  . nRBC 08/21/2019 PENDING  0 /100 WBC Incomplete  . Metamyelocytes Relative 08/21/2019 PENDING  % Incomplete  . Myelocytes 08/21/2019 PENDING  % Incomplete  . Promyelocytes Relative 08/21/2019 PENDING  % Incomplete  . Blasts 08/21/2019 PENDING  % Incomplete  . Immature Granulocytes 08/21/2019 PENDING  % Incomplete  . Abs Immature Granulocytes 08/21/2019 PENDING  0.00 - 0.07 K/uL Incomplete    (this displays the last labs from the last 3 days)  No results found for: TOTALPROTELP, ALBUMINELP, A1GS, A2GS, BETS, BETA2SER, GAMS, MSPIKE, SPEI (this displays SPEP labs)  No results found for: KPAFRELGTCHN, LAMBDASER, KAPLAMBRATIO (kappa/lambda light chains)  No results found for: HGBA, HGBA2QUANT, HGBFQUANT, HGBSQUAN (Hemoglobinopathy evaluation)   No results found for: LDH  No results found for: IRON, TIBC, IRONPCTSAT (Iron and TIBC)  No results found for: FERRITIN  Urinalysis No results found for: COLORURINE, APPEARANCEUR, LABSPEC, PHURINE, GLUCOSEU, HGBUR, BILIRUBINUR, KETONESUR, PROTEINUR, UROBILINOGEN, NITRITE, LEUKOCYTESUR   STUDIES: No results found.  ELIGIBLE FOR AVAILABLE RESEARCH PROTOCOL: no  ASSESSMENT:  48 y.o. [place], [place] woman status post right breast upper outer quadrant biopsy 05/01/2019 for a clinical T1c N1, stage IIA invasive ductal carcinoma, grade 3, estrogen and  progesterone receptor positive, HER-2 nonamplified, with an MIB-1-1 of 20%.  (a) mass in the axillary tail was a positive lymph node  (1) MammaPrint obtained from the original biopsy shows a high risk luminal subtype B tumor  (2) genetics testing 05/09/2019 through the Common Hereditary Gene Panel offered by [facility] found no deleterious mutations in APC, ATM, AXIN2, BARD1, BMPR1A, BRCA1, BRCA2, BRIP1, CDH1, CDK4, CDKN2A (p14ARF), CDKN2A (p16INK4a), CHEK2, CTNNA1, DICER1, EPCAM (Deletion/duplication testing only), GREM1 (promoter region deletion/duplication testing only), KIT, MEN1, MLH1, MSH2, MSH3, MSH6, MUTYH, NBN, NF1, NHTL1, PALB2, PDGFRA, PMS2, POLD1, POLE, PTEN, RAD50, RAD51C, RAD51D, RNF43, SDHB, SDHC, SDHD, SMAD4, SMARCA4. STK11, TP53, TSC1, TSC2, and VHL.  The following genes were evaluated for sequence changes only: SDHA and HOXB13 c.251G>A variant only.   (a) A variant of uncertain significance (VUS) was detected in one of her MSH6 genes (c.831A>C).  (3) status post right lumpectomy and sentinel lymph node sampling 06/12/2019 for a pT2 pN1, stage IIA invasive ductal carcinoma, grade 2, with positive margins  (a) a total of 4 sentinel lymph nodes removed, one positive (with ECE), ine itc  (b) margin clearance 04/19/2019 successful medial margin close but negative for DCIS  (4) adjuvant chemotherapy will consist of doxorubicin and cyclophosphamide in dose dense fashion x4 starting 07/10/2019 followed by weekly paclitaxel x12  (a) echo 06/26/2019 shows an EF of 60-65%  (5) adjuvant radiation to follow  (5) antiestrogens to start at the completion of local treatment  PLAN:  Mira is proceeding to cycle 3 of 4 planned cycles of cyclophosphamide and doxorubicin today.  Generally she is doing well with her treatment.  She did have some vomiting with cycle 2 and I have added ondansetron that she may take after 3 days if she still has nausea despite taking the Decadron and Compazine.  I  commended her excellent exercise program.  Also they are taking good pandemic precautions  She will return to see [place] in 2 weeks for her final cycle of this intense chemo after which she will start the much easier weekly paclitaxel treatment.  She knows to call for any other issues that may develop before the next visit.  Virgie Dad. Annayah Worthley, MD Medical Oncology and Hematology [facility] [place], [place] [address] Tel. [phone]    Fax. [phone]   I, Wilburn Mylar, am acting as scribe for Dr. Virgie Dad. Sylus Stgermain.  I, Lurline Del MD, have reviewed the above documentation for accuracy and completeness, and I agree with the above.

## 2019-08-21 ENCOUNTER — Inpatient Hospital Stay (HOSPITAL_BASED_OUTPATIENT_CLINIC_OR_DEPARTMENT_OTHER): Payer: 59 | Admitting: Oncology

## 2019-08-21 ENCOUNTER — Inpatient Hospital Stay: Payer: 59

## 2019-08-21 ENCOUNTER — Inpatient Hospital Stay: Payer: 59 | Attending: Oncology

## 2019-08-21 ENCOUNTER — Other Ambulatory Visit: Payer: Self-pay

## 2019-08-21 VITALS — BP 133/93 | HR 82

## 2019-08-21 VITALS — BP 130/84 | HR 100 | Temp 98.2°F | Resp 16 | Ht 61.0 in | Wt 220.3 lb

## 2019-08-21 DIAGNOSIS — C773 Secondary and unspecified malignant neoplasm of axilla and upper limb lymph nodes: Secondary | ICD-10-CM | POA: Insufficient documentation

## 2019-08-21 DIAGNOSIS — C50411 Malignant neoplasm of upper-outer quadrant of right female breast: Secondary | ICD-10-CM

## 2019-08-21 DIAGNOSIS — Z170 Estrogen receptor positive status [ER+]: Secondary | ICD-10-CM

## 2019-08-21 DIAGNOSIS — Z95828 Presence of other vascular implants and grafts: Secondary | ICD-10-CM

## 2019-08-21 DIAGNOSIS — E8770 Fluid overload, unspecified: Secondary | ICD-10-CM | POA: Insufficient documentation

## 2019-08-21 DIAGNOSIS — Z5189 Encounter for other specified aftercare: Secondary | ICD-10-CM | POA: Insufficient documentation

## 2019-08-21 DIAGNOSIS — Z5111 Encounter for antineoplastic chemotherapy: Secondary | ICD-10-CM | POA: Insufficient documentation

## 2019-08-21 LAB — COMPREHENSIVE METABOLIC PANEL
ALT: 18 U/L (ref 0–44)
AST: 14 U/L — ABNORMAL LOW (ref 15–41)
Albumin: 3.3 g/dL — ABNORMAL LOW (ref 3.5–5.0)
Alkaline Phosphatase: 70 U/L (ref 38–126)
Anion gap: 12 (ref 5–15)
BUN: 7 mg/dL (ref 6–20)
CO2: 24 mmol/L (ref 22–32)
Calcium: 8.8 mg/dL — ABNORMAL LOW (ref 8.9–10.3)
Chloride: 105 mmol/L (ref 98–111)
Creatinine, Ser: 0.71 mg/dL (ref 0.44–1.00)
GFR calc Af Amer: >60 mL/min (ref >60)
GFR calc non Af Amer: >60 mL/min (ref >60)
Glucose, Bld: 205 mg/dL — ABNORMAL HIGH (ref 70–99)
Potassium: 3.3 mmol/L — ABNORMAL LOW (ref 3.5–5.1)
Sodium: 141 mmol/L (ref 135–145)
Total Bilirubin: 0.6 mg/dL (ref 0.3–1.2)
Total Protein: 5.9 g/dL — ABNORMAL LOW (ref 6.5–8.1)

## 2019-08-21 LAB — CBC WITH DIFFERENTIAL/PLATELET
Abs Immature Granulocytes: 2.2 10*3/uL — ABNORMAL HIGH (ref 0.00–0.07)
Band Neutrophils: 7 %
Basophils Absolute: 0 10*3/uL (ref 0.0–0.1)
Basophils Relative: 0 %
Eosinophils Absolute: 0 10*3/uL (ref 0.0–0.5)
Eosinophils Relative: 0 %
HCT: 30.1 % — ABNORMAL LOW (ref 36.0–46.0)
Hemoglobin: 10.1 g/dL — ABNORMAL LOW (ref 12.0–15.0)
Lymphocytes Relative: 14 %
Lymphs Abs: 1.8 10*3/uL (ref 0.7–4.0)
MCH: 29.2 pg (ref 26.0–34.0)
MCHC: 33.6 g/dL (ref 30.0–36.0)
MCV: 87 fL (ref 80.0–100.0)
Metamyelocytes Relative: 12 %
Monocytes Absolute: 0.6 10*3/uL (ref 0.1–1.0)
Monocytes Relative: 5 %
Myelocytes: 5 %
Neutro Abs: 8.2 10*3/uL — ABNORMAL HIGH (ref 1.7–7.7)
Neutrophils Relative %: 57 %
Platelets: 166 10*3/uL (ref 150–400)
RBC: 3.46 MIL/uL — ABNORMAL LOW (ref 3.87–5.11)
RDW: 12.4 % (ref 11.5–15.5)
WBC: 12.8 10*3/uL — ABNORMAL HIGH (ref 4.0–10.5)
nRBC: 0.9 % — ABNORMAL HIGH (ref 0.0–0.2)

## 2019-08-21 MED ORDER — SODIUM CHLORIDE 0.9% FLUSH
10.0000 mL | INTRAVENOUS | Status: DC | PRN
  Administered 2019-08-21: 10 mL
  Filled 2019-08-21: qty 10

## 2019-08-21 MED ORDER — SODIUM CHLORIDE 0.9 % IV SOLN
Freq: Once | INTRAVENOUS | Status: AC
  Administered 2019-08-21: 09:00:00 via INTRAVENOUS
  Filled 2019-08-21: qty 250

## 2019-08-21 MED ORDER — SODIUM CHLORIDE 0.9 % IV SOLN
Freq: Once | INTRAVENOUS | Status: AC
  Administered 2019-08-21: 10:00:00 via INTRAVENOUS
  Filled 2019-08-21: qty 5

## 2019-08-21 MED ORDER — PEGFILGRASTIM 6 MG/0.6ML ~~LOC~~ PSKT
PREFILLED_SYRINGE | SUBCUTANEOUS | Status: AC
  Filled 2019-08-21: qty 0.6

## 2019-08-21 MED ORDER — SODIUM CHLORIDE 0.9 % IV SOLN
600.0000 mg/m2 | Freq: Once | INTRAVENOUS | Status: AC
  Administered 2019-08-21: 1260 mg via INTRAVENOUS
  Filled 2019-08-21: qty 63

## 2019-08-21 MED ORDER — HEPARIN SOD (PORK) LOCK FLUSH 100 UNIT/ML IV SOLN
500.0000 [IU] | Freq: Once | INTRAVENOUS | Status: AC | PRN
  Administered 2019-08-21: 11:00:00 500 [IU]
  Filled 2019-08-21: qty 5

## 2019-08-21 MED ORDER — PEGFILGRASTIM 6 MG/0.6ML ~~LOC~~ PSKT
6.0000 mg | PREFILLED_SYRINGE | Freq: Once | SUBCUTANEOUS | Status: AC
  Administered 2019-08-21: 6 mg via SUBCUTANEOUS

## 2019-08-21 MED ORDER — PALONOSETRON HCL INJECTION 0.25 MG/5ML
INTRAVENOUS | Status: AC
  Filled 2019-08-21: qty 5

## 2019-08-21 MED ORDER — ONDANSETRON HCL 8 MG PO TABS: 8.0000 mg | ORAL_TABLET | Freq: Three times a day (TID) | ORAL | 0 refills | Status: DC | PRN

## 2019-08-21 MED ORDER — PALONOSETRON HCL INJECTION 0.25 MG/5ML
0.2500 mg | Freq: Once | INTRAVENOUS | Status: AC
  Administered 2019-08-21: 0.25 mg via INTRAVENOUS

## 2019-08-21 MED ORDER — DOXORUBICIN HCL CHEMO IV INJECTION 2 MG/ML
60.0000 mg/m2 | Freq: Once | INTRAVENOUS | Status: AC
  Administered 2019-08-21: 126 mg via INTRAVENOUS
  Filled 2019-08-21: qty 63

## 2019-08-21 NOTE — Patient Instructions (Signed)
[facility] Discharge Instructions for Patients Receiving Chemotherapy  Today you received the following chemotherapy agents: Adriamycin, Cytoxan  To help prevent nausea and vomiting after your treatment, we encourage you to take your nausea medication as directed.   If you develop nausea and vomiting that is not controlled by your nausea medication, call the clinic.   BELOW ARE SYMPTOMS THAT SHOULD BE REPORTED IMMEDIATELY:  *FEVER GREATER THAN 100.5 F  *CHILLS WITH OR WITHOUT FEVER  NAUSEA AND VOMITING THAT IS NOT CONTROLLED WITH YOUR NAUSEA MEDICATION  *UNUSUAL SHORTNESS OF BREATH  *UNUSUAL BRUISING OR BLEEDING  TENDERNESS IN MOUTH AND THROAT WITH OR WITHOUT PRESENCE OF ULCERS  *URINARY PROBLEMS  *BOWEL PROBLEMS  UNUSUAL RASH Items with * indicate a potential emergency and should be followed up as soon as possible.  Feel free to call the clinic should you have any questions or concerns. The clinic phone number is [phone].  Please show the CHEMO ALERT CARD at check-in to the Emergency Department and triage nurse.   

## 2019-08-22 ENCOUNTER — Other Ambulatory Visit: Payer: Self-pay | Admitting: Oncology

## 2019-08-22 DIAGNOSIS — C50411 Malignant neoplasm of upper-outer quadrant of right female breast: Secondary | ICD-10-CM

## 2019-08-28 ENCOUNTER — Encounter (HOSPITAL_COMMUNITY): Payer: Self-pay

## 2019-08-28 ENCOUNTER — Inpatient Hospital Stay (HOSPITAL_COMMUNITY)
Admission: EM | Admit: 2019-08-28 | Discharge: 2019-08-31 | DRG: 872 | Disposition: A | Discharge status: Home / Self Care | Payer: 59 | Attending: Internal Medicine | Admitting: Internal Medicine

## 2019-08-28 ENCOUNTER — Other Ambulatory Visit: Payer: Self-pay

## 2019-08-28 ENCOUNTER — Emergency Department (HOSPITAL_COMMUNITY): Payer: 59

## 2019-08-28 DIAGNOSIS — E871 Hypo-osmolality and hyponatremia: Secondary | ICD-10-CM | POA: Diagnosis present

## 2019-08-28 DIAGNOSIS — Z806 Family history of leukemia: Secondary | ICD-10-CM

## 2019-08-28 DIAGNOSIS — D701 Agranulocytosis secondary to cancer chemotherapy: Secondary | ICD-10-CM | POA: Diagnosis present

## 2019-08-28 DIAGNOSIS — Z800 Family history of malignant neoplasm of digestive organs: Secondary | ICD-10-CM

## 2019-08-28 DIAGNOSIS — D6481 Anemia due to antineoplastic chemotherapy: Secondary | ICD-10-CM | POA: Diagnosis present

## 2019-08-28 DIAGNOSIS — R05 Cough: Secondary | ICD-10-CM

## 2019-08-28 DIAGNOSIS — D6959 Other secondary thrombocytopenia: Secondary | ICD-10-CM | POA: Diagnosis present

## 2019-08-28 DIAGNOSIS — K921 Melena: Secondary | ICD-10-CM

## 2019-08-28 DIAGNOSIS — Z170 Estrogen receptor positive status [ER+]: Secondary | ICD-10-CM

## 2019-08-28 DIAGNOSIS — I10 Essential (primary) hypertension: Secondary | ICD-10-CM | POA: Diagnosis present

## 2019-08-28 DIAGNOSIS — M5442 Lumbago with sciatica, left side: Secondary | ICD-10-CM | POA: Diagnosis present

## 2019-08-28 DIAGNOSIS — T451X5A Adverse effect of antineoplastic and immunosuppressive drugs, initial encounter: Secondary | ICD-10-CM | POA: Diagnosis present

## 2019-08-28 DIAGNOSIS — N390 Urinary tract infection, site not specified: Secondary | ICD-10-CM | POA: Diagnosis present

## 2019-08-28 DIAGNOSIS — G8929 Other chronic pain: Secondary | ICD-10-CM | POA: Diagnosis present

## 2019-08-28 DIAGNOSIS — Z8249 Family history of ischemic heart disease and other diseases of the circulatory system: Secondary | ICD-10-CM

## 2019-08-28 DIAGNOSIS — A419 Sepsis, unspecified organism: Secondary | ICD-10-CM | POA: Diagnosis present

## 2019-08-28 DIAGNOSIS — R059 Cough, unspecified: Secondary | ICD-10-CM

## 2019-08-28 DIAGNOSIS — Z6841 Body Mass Index (BMI) 40.0 and over, adult: Secondary | ICD-10-CM

## 2019-08-28 DIAGNOSIS — K644 Residual hemorrhoidal skin tags: Secondary | ICD-10-CM | POA: Diagnosis present

## 2019-08-28 DIAGNOSIS — D62 Acute posthemorrhagic anemia: Secondary | ICD-10-CM | POA: Diagnosis present

## 2019-08-28 DIAGNOSIS — D696 Thrombocytopenia, unspecified: Secondary | ICD-10-CM

## 2019-08-28 DIAGNOSIS — D709 Neutropenia, unspecified: Secondary | ICD-10-CM

## 2019-08-28 DIAGNOSIS — Z825 Family history of asthma and other chronic lower respiratory diseases: Secondary | ICD-10-CM

## 2019-08-28 DIAGNOSIS — D649 Anemia, unspecified: Secondary | ICD-10-CM

## 2019-08-28 DIAGNOSIS — C50411 Malignant neoplasm of upper-outer quadrant of right female breast: Secondary | ICD-10-CM | POA: Diagnosis present

## 2019-08-28 DIAGNOSIS — K648 Other hemorrhoids: Secondary | ICD-10-CM | POA: Diagnosis present

## 2019-08-28 DIAGNOSIS — Z8349 Family history of other endocrine, nutritional and metabolic diseases: Secondary | ICD-10-CM

## 2019-08-28 DIAGNOSIS — R5081 Fever presenting with conditions classified elsewhere: Secondary | ICD-10-CM | POA: Diagnosis present

## 2019-08-28 DIAGNOSIS — Z20828 Contact with and (suspected) exposure to other viral communicable diseases: Secondary | ICD-10-CM | POA: Diagnosis present

## 2019-08-28 IMAGING — DX DG CHEST 1V PORT
1 series · 1 of 1 positions shown · non-contrast
Comparison: Radiograph [DATE]

CLINICAL DATA: Cough and fever. Active chemotherapy for breast
cancer.

EXAM:
PORTABLE CHEST 1 VIEW

[chest ap]
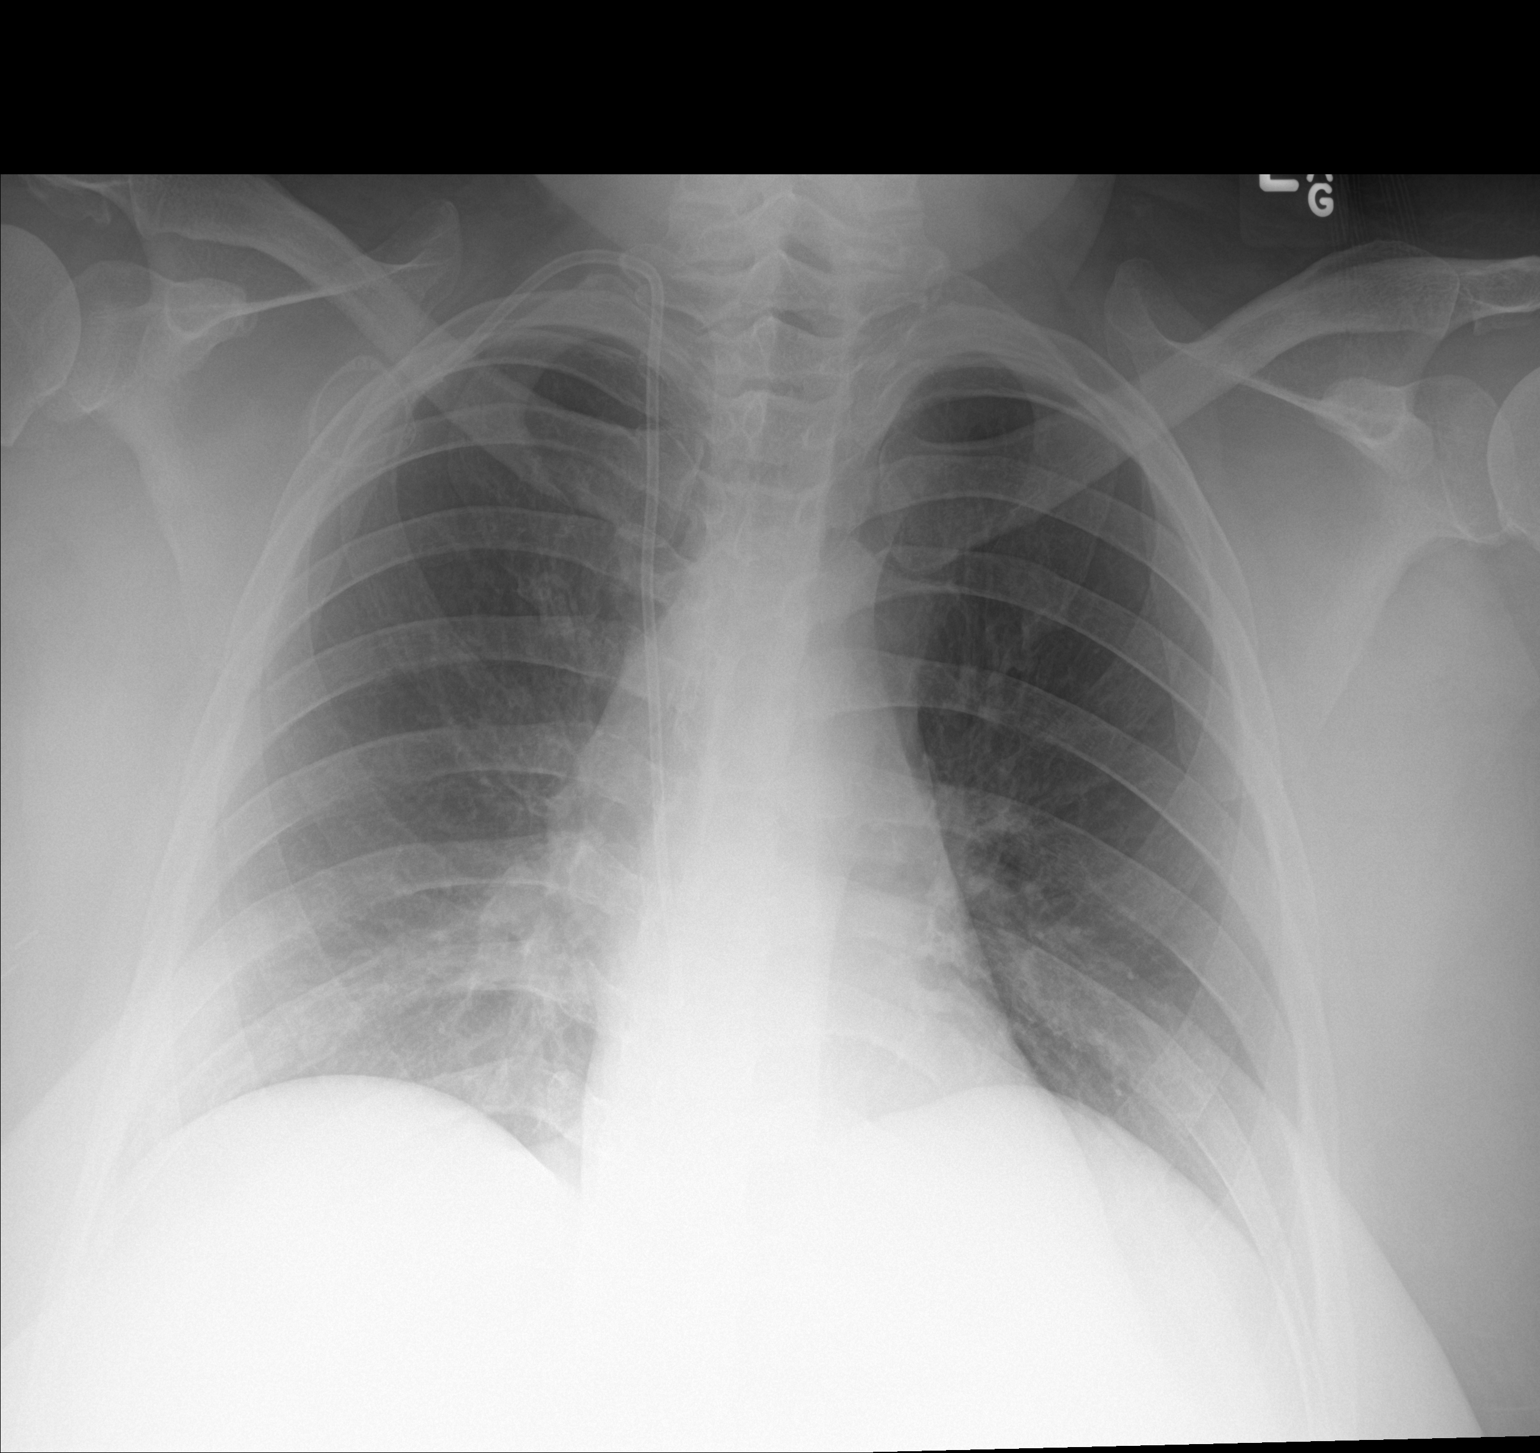

[1 of 1 positions shown; findings below may reference images not displayed]

FINDINGS: Right chest port remains in place. Unchanged heart size and
mediastinal contours. No focal airspace disease, pleural effusion,
or pneumothorax. No acute osseous abnormalities are seen.
IMPRESSION: No acute chest findings.

## 2019-08-28 MED ORDER — ACETAMINOPHEN 500 MG PO TABS
1000.0000 mg | ORAL_TABLET | Freq: Once | ORAL | Status: AC
  Administered 2019-08-29: 1000 mg via ORAL
  Filled 2019-08-28: qty 2

## 2019-08-28 MED ORDER — SODIUM CHLORIDE 0.9 % IV BOLUS
500.0000 mL | Freq: Once | INTRAVENOUS | Status: AC
  Administered 2019-08-29: 500 mL via INTRAVENOUS

## 2019-08-28 MED ORDER — LIDOCAINE HCL URETHRAL/MUCOSAL 2 % EX GEL
1.0000 "application " | Freq: Once | CUTANEOUS | Status: DC | PRN
  Filled 2019-08-28: qty 5

## 2019-08-28 NOTE — ED Triage Notes (Addendum)
Pt states that she has been taking chemo (last treatment on Tuesday) and today, she noticed a fever, heart racing, and low BP. States that she just doesn't feel right. Endorses chills and productive cough.

## 2019-08-28 NOTE — ED Provider Notes (Deleted)
48 year old female with history of breast cancer currently undergoing chemotherapy with cyclophosphamide and doxorubicin and last infusion November 10 developed fever tonight with some chills.  There has been a nonproductive cough.  No exposure to COVID-19.  She is nontoxic in appearance.  Will need to check CBC to make sure she has adequate neutrophil count, sepsis evaluation also initiated.   Delora Fuel, MD [phone]

## 2019-08-28 NOTE — ED Provider Notes (Signed)
[facility] DEPT Provider Note   CSN: [id number] Arrival date & time: 08/28/19  2240     History   Chief Complaint Chief Complaint  Patient presents with  . Fever  . Tachycardia    HPI Veronica [name] is a 48 y.o. female with a h/o of estrogen receptor positive right breast cancer on adjuvant chemotherapy s/p right lumpectomy who presents to the emergency department with a chief complaint of fever and chills.  The patient reports that she was feeling feverish earlier tonight with chills and checked her temperature at home, which was ~101 F.  She also felt as if her heart was racing and her blood pressure low. BP was ~110/70s at home. States "I just don't feel right."   She reports that she has had a productive cough with clear sputum for several days.  The patient states typically walks between 1-3 miles most days outside, but has not walked for the last week since her last chemotherapy treatment (08/21/19) because she has been very fatigued and short of breath.   She also notes that she has had bright red blood in her stool for the last week. She mentioned this to her oncologist and was started on sitz bath and over-the-counter hemorrhoidal cream with minimal improvement.  She also notes that her stools have been looser since her last chemotherapy treatment and she has been taking Imodium.  She denies abdominal pain, vomiting, chest pain, back pain, leg swelling, numbness, weakness, dizziness, lightheadedness, dysuria, hematuria, vaginal discharge, constipation, nausea.  She did have an episode of ~2 weeks ago where she felt she had some food stuck in her chest and has been having worsening indigestion has been taking Pepcid daily.  Reports that she was recently restarted on ciprofloxacin due to neutropenia.  She has had poor p.o. intake over the last week due to poor appetite, which she states is unusual after treatment as typically this will resolve  within a few days.  She reports that she has not had any known or suspected COVID-19 contacts.  She reports that both she and her family have been very cautious given her immunocompromise state.  The patient reports that she has been staying at home and following CDC guidelines.  Veronica [name] was evaluated in Emergency Department on 08/29/2019 for the symptoms described in the history of present illness. She was evaluated in the context of the global COVID-19 pandemic, which necessitated consideration that the patient might be at risk for infection with the SARS-CoV-2 virus that causes COVID-19. Institutional protocols and algorithms that pertain to the evaluation of patients at risk for COVID-19 are in a state of rapid change based on information released by regulatory bodies including the CDC and federal and state organizations. These policies and algorithms were followed during the patient's care in the ED.     The history is provided by the patient. No language interpreter was used.    Past Medical History:  Diagnosis Date  . Cancer ([place])   . Chronic low back pain with left-sided sciatica   . Family history of leukemia   . Family history of stomach cancer   . Hypertension     Patient Active Problem List   Diagnosis Date Noted  . Sepsis ([place]) 08/29/2019  . Port-A-Cath in place 07/24/2019  . Genetic testing 05/17/2019  . Family history of leukemia   . Family history of stomach cancer   . Malignant neoplasm of upper-outer quadrant of right breast in  female, estrogen receptor positive ([place]) 05/07/2019  . S/P laparoscopic assisted vaginal hysterectomy ([facility]) 05/17/2011    Class: Chronic  . FOOT PAIN, RIGHT 07/11/2010    Past Surgical History:  Procedure Laterality Date  . BREAST BIOPSY Right 05/02/2019   right clips X2  . BREAST LUMPECTOMY WITH RADIOACTIVE SEED AND SENTINEL LYMPH NODE BIOPSY Right 06/12/2019   Procedure: RIGHT BREAST RADIOACTIVE SEED LUMPECTOMY  AND RIGHT  AXILLARY SEED TARGETED LYMPH NODE AND AXILLARY SENTINEL LYMPH NODE MAPPING;  Surgeon: Erroll Luna, MD;  Location: [facility];  Service: General;  Laterality: Right;  PEC BLOCK  . C/S x2  '98 '03  . CESAREAN SECTION    . CHOLECYSTECTOMY    . LAPAROSCOPIC ASSISTED VAGINAL HYSTERECTOMY  05/17/2011   Procedure: LAPAROSCOPIC ASSISTED VAGINAL HYSTERECTOMY;  Surgeon: Sharene Butters;  Location: [facility];  Service: Gynecology;  Laterality: N/A;  Laparoscopic Assisted Vaginal Hysterectomy With Lysis Of Adhesions  . PORTACATH PLACEMENT Right 06/12/2019   Procedure: INSERTION PORT-A-CATH WITH ULTRASOUND;  Surgeon: Erroll Luna, MD;  Location: [facility];  Service: General;  Laterality: Right;  . RE-EXCISION OF BREAST LUMPECTOMY Right 07/17/2019   Procedure: RE-EXCISION OF RIGHT BREAST LUMPECTOMY;  Surgeon: Erroll Luna, MD;  Location: [facility];  Service: General;  Laterality: Right;  . TUBAL LIGATION       OB History   No obstetric history on file.      Home Medications    Prior to Admission medications   Medication Sig Start Date End Date Taking? Authorizing Provider  acetaminophen (TYLENOL) 500 MG tablet Take 500 mg by mouth every 6 (six) hours as needed for mild pain.    Yes [provider]  Cholecalciferol (VITAMIN D3) 25 MCG (1000 UT) CHEW Chew 1 tablet by mouth daily.    Yes [provider]  ciprofloxacin (CIPRO) 500 MG tablet Take 1 tablet (500 mg total) by mouth 2 (two) times daily. 08/07/19  Yes Causey, Charlestine Massed, NP  dexamethasone (DECADRON) 4 MG tablet TAKE TWO TABLETS BY MOUTH ONCE A DAY ON THE DAY AFTER CHEMOTHERAPY AND THEN TAKE TWO TABLETS BY MOUTH TWICE DAILY FOR 2 DAYS 08/22/19  Yes Magrinat, Virgie Dad, MD  famotidine (PEPCID) 10 MG tablet Take 10 mg by mouth 2 (two) times daily.   Yes [provider]  lidocaine-prilocaine (EMLA) cream Apply to affected area once 06/21/19  Yes Magrinat, Virgie Dad, MD   loratadine (CLARITIN) 10 MG tablet Take 10 mg by mouth See admin instructions. Takes for 5 days after chemo   Yes [provider]  LORazepam (ATIVAN) 0.5 MG tablet Take 1 tablet (0.5 mg total) by mouth at bedtime as needed (Nausea or vomiting). 06/21/19  Yes Magrinat, Virgie Dad, MD  olmesartan-hydrochlorothiazide (BENICAR HCT) 20-12.5 MG tablet Take 1 tablet by mouth daily.   Yes [provider]  ondansetron (ZOFRAN) 8 MG tablet Take 1 tablet (8 mg total) by mouth every 8 (eight) hours as needed for nausea or vomiting. 08/21/19  Yes Magrinat, Virgie Dad, MD  prochlorperazine (COMPAZINE) 10 MG tablet Take 1 tablet (10 mg total) by mouth every 6 (six) hours as needed (Nausea or vomiting). 06/21/19  Yes Magrinat, Virgie Dad, MD    Family History Family History  Problem Relation Age of Onset  . Arthritis Mother   . COPD Mother   . Hypertension Mother   . Hyperlipidemia Mother   . Leukemia Brother 32  . Stomach cancer Maternal Grandmother  late 36s    Social History Social History   Tobacco Use  . Smoking status: Never Smoker  . Smokeless tobacco: Never Used  Substance Use Topics  . Alcohol use: Yes    Alcohol/week: 2.0 standard drinks    Types: 2 Standard drinks or equivalent per week    Comment: "2-3 a week"  . Drug use: No     Allergies   Patient has no known allergies.   Review of Systems Review of Systems  Constitutional: Positive for chills, fatigue and fever. Negative for activity change.  HENT: Negative for congestion.   Respiratory: Positive for cough and shortness of breath. Negative for wheezing.   Cardiovascular: Negative for chest pain, palpitations and leg swelling.  Gastrointestinal: Positive for blood in stool and nausea. Negative for abdominal pain, constipation and vomiting.  Genitourinary: Negative for dysuria, frequency and urgency.  Musculoskeletal: Negative for back pain.  Skin: Negative for rash.  Allergic/Immunologic: Negative for  immunocompromised state.  Neurological: Negative for seizures, syncope, weakness, numbness and headaches.  Psychiatric/Behavioral: Negative for confusion.     Physical Exam Updated Vital Signs BP (!) 119/54   Pulse 99   Temp 98.6 F (37 C) (Oral)   Resp 18   SpO2 97%   Physical Exam Vitals signs and nursing note reviewed.  Constitutional:      General: She is not in acute distress.    Appearance: She is not ill-appearing or diaphoretic.     Comments: Nontoxic appearing.   HENT:     Head: Normocephalic.     Mouth/Throat:     Mouth: Mucous membranes are moist.  Eyes:     Conjunctiva/sclera: Conjunctivae normal.  Neck:     Musculoskeletal: Normal range of motion and neck supple. No neck rigidity.  Cardiovascular:     Rate and Rhythm: Regular rhythm. Tachycardia present.     Heart sounds: No murmur. No friction rub. No gallop.   Pulmonary:     Effort: Pulmonary effort is normal. No respiratory distress.     Breath sounds: No stridor.     Comments: Port is in place over the right anterior chest wall.  No redness, warmth, or tenderness to palpation. Abdominal:     General: There is no distension.     Palpations: Abdomen is soft.     Comments: Mild epigastric tenderness without rebound or guarding.  Abdomen is otherwise soft and nondistended.  Genitourinary:    Comments: Chaperoned exam.  There are multiple nonthrombosed external hemorrhoids as well as multiple, palpable internal hemorrhoids.  Digital rectal exam was performed.  No gross blood on exam, but sample may be an adequate due to patient's ability to tolerate the procedure. Musculoskeletal:     Right lower leg: No edema.     Left lower leg: No edema.  Lymphadenopathy:     Cervical: No cervical adenopathy.  Skin:    General: Skin is warm.     Capillary Refill: Capillary refill takes less than 2 seconds.     Coloration: Skin is not jaundiced or pale.     Findings: No rash.  Neurological:     Mental Status: She is  alert.  Psychiatric:        Behavior: Behavior normal.      ED Treatments / Results  Labs (all labs ordered are listed, but only abnormal results are displayed) Labs Reviewed  LACTIC ACID, PLASMA - Abnormal; Notable for the following components:      Result Value   Lactic Acid,  Venous 2.3 (*)    All other components within normal limits  COMPREHENSIVE METABOLIC PANEL - Abnormal; Notable for the following components:   Sodium 133 (*)    Glucose, Bld 197 (*)    Total Bilirubin 1.6 (*)    All other components within normal limits  CBC WITH DIFFERENTIAL/PLATELET - Abnormal; Notable for the following components:   WBC 0.6 (*)    RBC 3.06 (*)    Hemoglobin 8.9 (*)    HCT 26.8 (*)    Platelets 92 (*)    Neutro Abs 0.2 (*)    Lymphs Abs 0.3 (*)    All other components within normal limits  URINALYSIS, ROUTINE W REFLEX MICROSCOPIC - Abnormal; Notable for the following components:   APPearance CLOUDY (*)    Hgb urine dipstick MODERATE (*)    Leukocytes,Ua MODERATE (*)    Bacteria, UA FEW (*)    All other components within normal limits  POC OCCULT BLOOD, ED - Abnormal; Notable for the following components:   Fecal Occult Bld POSITIVE (*)    All other components within normal limits  CULTURE, BLOOD (ROUTINE X 2)  CULTURE, BLOOD (ROUTINE X 2)  URINE CULTURE  SARS CORONAVIRUS 2 (TAT 6-24 HRS)  APTT  PROTIME-INR  LACTIC ACID, PLASMA    EKG EKG Interpretation  Date/Time:  Tuesday August 28 2019 23:57:10 EST Ventricular Rate:  124 PR Interval:    QRS Duration: 82 QT Interval:  299 QTC Calculation: 430 R Axis:   49 Text Interpretation: Sinus tachycardia Low voltage, precordial leads When compared with ECG of 06/08/2019, HEART RATE has increased Confirmed by Delora Fuel ([id number]) on 08/29/2019 12:13:41 AM   Radiology Dg Chest Port 1 View  Result Date: 08/28/2019 CLINICAL DATA:  Cough and fever. Active chemotherapy for breast cancer. EXAM: PORTABLE CHEST 1 VIEW  COMPARISON:  Radiograph 06/12/2019 FINDINGS: Right chest port remains in place. Unchanged heart size and mediastinal contours. No focal airspace disease, pleural effusion, or pneumothorax. No acute osseous abnormalities are seen. IMPRESSION: No acute chest findings. Electronically Signed   By: Keith Rake M.D.   On: 08/28/2019 23:43    Procedures .Critical Care Performed by: Joanne Gavel, PA-C Authorized by: Joanne Gavel, PA-C   Critical care provider statement:    Critical care time (minutes):  45   Critical care time was exclusive of:  Separately billable procedures and treating other patients   Critical care was necessary to treat or prevent imminent or life-threatening deterioration of the following conditions:  Sepsis   Critical care was time spent personally by me on the following activities:  Ordering and review of laboratory studies, ordering and review of radiographic studies, ordering and performing treatments and interventions, pulse oximetry, re-evaluation of patient's condition, review of old charts, obtaining history from patient or surrogate, examination of patient, evaluation of patient's response to treatment, discussions with primary provider and development of treatment plan with patient or surrogate   (including critical care time)  Medications Ordered in ED Medications  lidocaine (XYLOCAINE) 2 % jelly 1 application (has no administration in time range)  vancomycin (VANCOCIN) 2,000 mg in sodium chloride 0.9 % 500 mL IVPB (2,000 mg Intravenous New Bag/Given 08/29/19 0159)  acetaminophen (TYLENOL) tablet 1,000 mg (1,000 mg Oral Given 08/29/19 0011)  sodium chloride 0.9 % bolus 500 mL (0 mLs Intravenous Stopped 08/29/19 0050)  sodium chloride 0.9 % bolus 1,000 mL (0 mLs Intravenous Stopped 08/29/19 0143)    And  sodium chloride 0.9 %  bolus 1,000 mL (0 mLs Intravenous Stopped 08/29/19 0143)    And  sodium chloride 0.9 % bolus 1,000 mL (1,000 mLs Intravenous New  Bag/Given 08/29/19 0110)  ceFEPIme (MAXIPIME) 2 g in sodium chloride 0.9 % 100 mL IVPB (0 g Intravenous Stopped 08/29/19 0228)     Initial Impression / Assessment and Plan / ED Course  I have reviewed the triage vital signs and the nursing notes.  Pertinent labs & imaging results that were available during my care of the patient were reviewed by me and considered in my medical decision making (see chart for details).        48 year old female with a h/o of estrogen receptor positive right breast cancer on adjuvant chemotherapy s/p right lumpectomy presenting with fever and chills, onset tonight.  The patient has been having shortness of breath, productive cough with clear sputum, and anorexia over the last week since her last chemotherapy treatment.  She has also noted a small amount of bright red blood in her stool, but on exam has nonthrombosed external hemorrhoids and palpable internal hemorrhoids, which I suspect is the etiology of her positive Hemoccult.  She has minimal tenderness to palpation the epigastric region, but otherwise abdominal exam is unremarkable.  Febrile to 102.6 on arrival with heart rate in the 120s.  She was given 1 g of Tylenol.  She had an elevated lactate to 2.3, which was treated with IV fluids. ANC is 120.    Chest x-ray is unremarkable.  COVID-19 test is pending. The patient was seen and independently evaluated by Dr. Roxanne Mins, attending physician.  It is possible that the patient's symptoms could be secondary to COVID-19, but the patient reports that she has been extremely cautious. Port-A-Cath is nontender.  Although the patient is not endorsing any urinary complaints, there is some moderate hemoglobinuria and leukocyte esterase in her urine.  Urine culture has been sent as this could be the etiology of her fever. No flank pain or history of kidney stones Will initiate broad-spectrum antibiotics while blood cultures are pending for neutropenic fever.  The patient  appears reasonably stabilized for admission considering the current resources, flow, and capabilities available in the ED at this time, and I doubt any other [facility] requiring further screening and/or treatment in the ED prior to admission.   Final Clinical Impressions(s) / ED Diagnoses   Final diagnoses:  Neutropenic fever [facility])    ED Discharge Orders    None       Joanne Gavel, PA-C [id number]    Delora Fuel, MD [phone]

## 2019-08-28 NOTE — ED Notes (Signed)
PA at bedside.

## 2019-08-29 ENCOUNTER — Other Ambulatory Visit: Payer: Self-pay | Admitting: Oncology

## 2019-08-29 DIAGNOSIS — E871 Hypo-osmolality and hyponatremia: Secondary | ICD-10-CM | POA: Diagnosis present

## 2019-08-29 DIAGNOSIS — Z20828 Contact with and (suspected) exposure to other viral communicable diseases: Secondary | ICD-10-CM | POA: Diagnosis present

## 2019-08-29 DIAGNOSIS — I10 Essential (primary) hypertension: Secondary | ICD-10-CM | POA: Diagnosis present

## 2019-08-29 DIAGNOSIS — K644 Residual hemorrhoidal skin tags: Secondary | ICD-10-CM | POA: Diagnosis present

## 2019-08-29 DIAGNOSIS — N390 Urinary tract infection, site not specified: Secondary | ICD-10-CM | POA: Diagnosis present

## 2019-08-29 DIAGNOSIS — D696 Thrombocytopenia, unspecified: Secondary | ICD-10-CM

## 2019-08-29 DIAGNOSIS — D61810 Antineoplastic chemotherapy induced pancytopenia: Secondary | ICD-10-CM

## 2019-08-29 DIAGNOSIS — D6959 Other secondary thrombocytopenia: Secondary | ICD-10-CM

## 2019-08-29 DIAGNOSIS — G8929 Other chronic pain: Secondary | ICD-10-CM | POA: Diagnosis present

## 2019-08-29 DIAGNOSIS — K921 Melena: Secondary | ICD-10-CM

## 2019-08-29 DIAGNOSIS — R05 Cough: Secondary | ICD-10-CM | POA: Diagnosis not present

## 2019-08-29 DIAGNOSIS — A419 Sepsis, unspecified organism: Secondary | ICD-10-CM | POA: Diagnosis present

## 2019-08-29 DIAGNOSIS — Z800 Family history of malignant neoplasm of digestive organs: Secondary | ICD-10-CM | POA: Diagnosis not present

## 2019-08-29 DIAGNOSIS — T451X5A Adverse effect of antineoplastic and immunosuppressive drugs, initial encounter: Secondary | ICD-10-CM | POA: Diagnosis present

## 2019-08-29 DIAGNOSIS — R197 Diarrhea, unspecified: Secondary | ICD-10-CM

## 2019-08-29 DIAGNOSIS — K648 Other hemorrhoids: Secondary | ICD-10-CM | POA: Diagnosis present

## 2019-08-29 DIAGNOSIS — D649 Anemia, unspecified: Secondary | ICD-10-CM

## 2019-08-29 DIAGNOSIS — Z8349 Family history of other endocrine, nutritional and metabolic diseases: Secondary | ICD-10-CM | POA: Diagnosis not present

## 2019-08-29 DIAGNOSIS — Z8249 Family history of ischemic heart disease and other diseases of the circulatory system: Secondary | ICD-10-CM | POA: Diagnosis not present

## 2019-08-29 DIAGNOSIS — M5442 Lumbago with sciatica, left side: Secondary | ICD-10-CM | POA: Diagnosis present

## 2019-08-29 DIAGNOSIS — Z806 Family history of leukemia: Secondary | ICD-10-CM | POA: Diagnosis not present

## 2019-08-29 DIAGNOSIS — D709 Neutropenia, unspecified: Secondary | ICD-10-CM

## 2019-08-29 DIAGNOSIS — D701 Agranulocytosis secondary to cancer chemotherapy: Secondary | ICD-10-CM | POA: Diagnosis present

## 2019-08-29 DIAGNOSIS — Z170 Estrogen receptor positive status [ER+]: Secondary | ICD-10-CM

## 2019-08-29 DIAGNOSIS — C50411 Malignant neoplasm of upper-outer quadrant of right female breast: Secondary | ICD-10-CM | POA: Diagnosis present

## 2019-08-29 DIAGNOSIS — Z6841 Body Mass Index (BMI) 40.0 and over, adult: Secondary | ICD-10-CM | POA: Diagnosis not present

## 2019-08-29 DIAGNOSIS — R5081 Fever presenting with conditions classified elsewhere: Secondary | ICD-10-CM

## 2019-08-29 DIAGNOSIS — Z825 Family history of asthma and other chronic lower respiratory diseases: Secondary | ICD-10-CM | POA: Diagnosis not present

## 2019-08-29 DIAGNOSIS — D62 Acute posthemorrhagic anemia: Secondary | ICD-10-CM | POA: Diagnosis present

## 2019-08-29 DIAGNOSIS — C773 Secondary and unspecified malignant neoplasm of axilla and upper limb lymph nodes: Secondary | ICD-10-CM

## 2019-08-29 DIAGNOSIS — K649 Unspecified hemorrhoids: Secondary | ICD-10-CM

## 2019-08-29 LAB — URINALYSIS, ROUTINE W REFLEX MICROSCOPIC
Bilirubin Urine: NEGATIVE
Glucose, UA: NEGATIVE mg/dL
Ketones, ur: NEGATIVE mg/dL
Nitrite: NEGATIVE
Protein, ur: NEGATIVE mg/dL
Specific Gravity, Urine: 1.013 (ref 1.005–1.030)
pH: 5 (ref 5.0–8.0)

## 2019-08-29 LAB — URINE CULTURE

## 2019-08-29 LAB — CBC WITH DIFFERENTIAL/PLATELET
Abs Immature Granulocytes: 0.02 10*3/uL (ref 0.00–0.07)
Abs Immature Granulocytes: 0.06 10*3/uL (ref 0.00–0.07)
Basophils Absolute: 0 10*3/uL (ref 0.0–0.1)
Basophils Absolute: 0 10*3/uL (ref 0.0–0.1)
Basophils Relative: 2 %
Basophils Relative: 3 %
Eosinophils Absolute: 0 10*3/uL (ref 0.0–0.5)
Eosinophils Absolute: 0 10*3/uL (ref 0.0–0.5)
Eosinophils Relative: 0 %
Eosinophils Relative: 0 %
HCT: 26.8 % — ABNORMAL LOW (ref 36.0–46.0)
HCT: 22.2 % — ABNORMAL LOW (ref 36.0–46.0)
Hemoglobin: 8.9 g/dL — ABNORMAL LOW (ref 12.0–15.0)
Hemoglobin: 7.3 g/dL — ABNORMAL LOW (ref 12.0–15.0)
Immature Granulocytes: 4 %
Immature Granulocytes: 10 %
Lymphocytes Relative: 43 %
Lymphocytes Relative: 51 %
Lymphs Abs: 0.3 10*3/uL — ABNORMAL LOW (ref 0.7–4.0)
Lymphs Abs: 0.3 10*3/uL — ABNORMAL LOW (ref 0.7–4.0)
MCH: 29.1 pg (ref 26.0–34.0)
MCH: 29.4 pg (ref 26.0–34.0)
MCHC: 33.2 g/dL (ref 30.0–36.0)
MCHC: 32.9 g/dL (ref 30.0–36.0)
MCV: 87.6 fL (ref 80.0–100.0)
MCV: 89.5 fL (ref 80.0–100.0)
Monocytes Absolute: 0.1 10*3/uL (ref 0.1–1.0)
Monocytes Absolute: 0.1 10*3/uL (ref 0.1–1.0)
Monocytes Relative: 18 %
Monocytes Relative: 22 %
Neutro Abs: 0.2 10*3/uL — ABNORMAL LOW (ref 1.7–7.7)
Neutro Abs: 0.1 10*3/uL — ABNORMAL LOW (ref 1.7–7.7)
Neutrophils Relative %: 33 %
Neutrophils Relative %: 14 %
Platelets: 92 10*3/uL — ABNORMAL LOW (ref 150–400)
Platelets: 72 10*3/uL — ABNORMAL LOW (ref 150–400)
RBC: 3.06 MIL/uL — ABNORMAL LOW (ref 3.87–5.11)
RBC: 2.48 MIL/uL — ABNORMAL LOW (ref 3.87–5.11)
RDW: 12.8 % (ref 11.5–15.5)
RDW: 12.8 % (ref 11.5–15.5)
WBC: 0.6 10*3/uL — CL (ref 4.0–10.5)
WBC: 0.6 10*3/uL — CL (ref 4.0–10.5)
nRBC: 0 % (ref 0.0–0.2)
nRBC: 0 % (ref 0.0–0.2)

## 2019-08-29 LAB — COMPREHENSIVE METABOLIC PANEL
ALT: 39 U/L (ref 0–44)
AST: 18 U/L (ref 15–41)
Albumin: 3.7 g/dL (ref 3.5–5.0)
Alkaline Phosphatase: 80 U/L (ref 38–126)
Anion gap: 9 (ref 5–15)
BUN: 15 mg/dL (ref 6–20)
CO2: 22 mmol/L (ref 22–32)
Calcium: 8.9 mg/dL (ref 8.9–10.3)
Chloride: 102 mmol/L (ref 98–111)
Creatinine, Ser: 0.81 mg/dL (ref 0.44–1.00)
GFR calc Af Amer: >60 mL/min (ref >60)
GFR calc non Af Amer: >60 mL/min (ref >60)
Glucose, Bld: 197 mg/dL — ABNORMAL HIGH (ref 70–99)
Potassium: 4 mmol/L (ref 3.5–5.1)
Sodium: 133 mmol/L — ABNORMAL LOW (ref 135–145)
Total Bilirubin: 1.6 mg/dL — ABNORMAL HIGH (ref 0.3–1.2)
Total Protein: 6.5 g/dL (ref 6.5–8.1)

## 2019-08-29 LAB — APTT: aPTT: 25 seconds (ref 24–36)

## 2019-08-29 LAB — PROTIME-INR
INR: 1 (ref 0.8–1.2)
Prothrombin Time: 13.2 seconds (ref 11.4–15.2)

## 2019-08-29 LAB — GLUCOSE, CAPILLARY: Glucose-Capillary: 181 mg/dL — ABNORMAL HIGH (ref 70–99)

## 2019-08-29 LAB — SARS CORONAVIRUS 2 (TAT 6-24 HRS): SARS Coronavirus 2: NEGATIVE

## 2019-08-29 LAB — LACTIC ACID, PLASMA
Lactic Acid, Venous: 1.4 mmol/L (ref 0.5–1.9)
Lactic Acid, Venous: 2.3 mmol/L (ref 0.5–1.9)

## 2019-08-29 LAB — POC OCCULT BLOOD, ED: Fecal Occult Bld: POSITIVE — AB

## 2019-08-29 MED ORDER — PSYLLIUM 95 % PO PACK
1.0000 | PACK | Freq: Every day | ORAL | Status: DC
  Administered 2019-08-29 – 2019-08-31 (×3): 1 via ORAL
  Filled 2019-08-29 (×3): qty 1

## 2019-08-29 MED ORDER — VANCOMYCIN HCL 10 G IV SOLR
2000.0000 mg | Freq: Once | INTRAVENOUS | Status: AC
  Administered 2019-08-29: 02:00:00 2000 mg via INTRAVENOUS
  Filled 2019-08-29: qty 2000

## 2019-08-29 MED ORDER — ACETAMINOPHEN 650 MG RE SUPP: 650.0000 mg | Freq: Four times a day (QID) | RECTAL | Status: DC | PRN

## 2019-08-29 MED ORDER — SODIUM CHLORIDE 0.9 % IV SOLN
INTRAVENOUS | Status: AC
  Administered 2019-08-29: 06:00:00 via INTRAVENOUS

## 2019-08-29 MED ORDER — SODIUM CHLORIDE 0.9 % IV SOLN
2.0000 g | Freq: Once | INTRAVENOUS | Status: AC
  Administered 2019-08-29: 02:00:00 2 g via INTRAVENOUS
  Filled 2019-08-29: qty 2

## 2019-08-29 MED ORDER — FAMOTIDINE 20 MG PO TABS
10.0000 mg | ORAL_TABLET | Freq: Two times a day (BID) | ORAL | Status: DC
  Administered 2019-08-29 – 2019-08-31 (×5): 10 mg via ORAL
  Filled 2019-08-29 (×5): qty 1

## 2019-08-29 MED ORDER — HYDROCORTISONE ACETATE 25 MG RE SUPP
25.0000 mg | Freq: Two times a day (BID) | RECTAL | Status: DC
  Administered 2019-08-29 – 2019-08-31 (×5): 25 mg via RECTAL
  Filled 2019-08-29 (×6): qty 1

## 2019-08-29 MED ORDER — SODIUM CHLORIDE 0.9 % IV BOLUS (SEPSIS)
1000.0000 mL | Freq: Once | INTRAVENOUS | Status: AC
  Administered 2019-08-29: 01:00:00 1000 mL via INTRAVENOUS

## 2019-08-29 MED ORDER — ONDANSETRON HCL 4 MG/2ML IJ SOLN
4.0000 mg | Freq: Four times a day (QID) | INTRAMUSCULAR | Status: DC | PRN
  Administered 2019-08-29: 4 mg via INTRAVENOUS
  Filled 2019-08-29: qty 2

## 2019-08-29 MED ORDER — ACETAMINOPHEN 325 MG PO TABS: 650.0000 mg | ORAL_TABLET | Freq: Four times a day (QID) | ORAL | Status: DC | PRN

## 2019-08-29 MED ORDER — VANCOMYCIN HCL 10 G IV SOLR
1500.0000 mg | INTRAVENOUS | Status: DC
  Administered 2019-08-29 – 2019-08-30 (×2): 1500 mg via INTRAVENOUS
  Filled 2019-08-29 (×3): qty 1500

## 2019-08-29 MED ORDER — SODIUM CHLORIDE 0.9% FLUSH
10.0000 mL | INTRAVENOUS | Status: DC | PRN
  Administered 2019-08-31: 16:00:00 10 mL
  Filled 2019-08-29: qty 40

## 2019-08-29 MED ORDER — SODIUM CHLORIDE 0.9 % IV BOLUS (SEPSIS)
1000.0000 mL | Freq: Once | INTRAVENOUS | Status: AC
  Administered 2019-08-29: 1000 mL via INTRAVENOUS

## 2019-08-29 MED ORDER — ENSURE ENLIVE PO LIQD
237.0000 mL | Freq: Two times a day (BID) | ORAL | Status: DC
  Administered 2019-08-29 – 2019-08-31 (×2): 237 mL via ORAL

## 2019-08-29 MED ORDER — SODIUM CHLORIDE 0.9 % IV SOLN
2.0000 g | Freq: Three times a day (TID) | INTRAVENOUS | Status: DC
  Administered 2019-08-29 – 2019-08-31 (×7): 2 g via INTRAVENOUS
  Filled 2019-08-29 (×11): qty 2

## 2019-08-29 MED ORDER — CHLORHEXIDINE GLUCONATE CLOTH 2 % EX PADS
6.0000 | MEDICATED_PAD | Freq: Every day | CUTANEOUS | Status: DC
  Administered 2019-08-29 – 2019-08-31 (×3): 6 via TOPICAL

## 2019-08-29 MED ORDER — ENOXAPARIN SODIUM 40 MG/0.4ML ~~LOC~~ SOLN: 40.0000 mg | SUBCUTANEOUS | Status: DC

## 2019-08-29 NOTE — ED Notes (Signed)
Date and time results received: 08/29/19 0116   Test: wbc Critical Value: 0.6  Name of Provider Notified: Maree Erie, [place] and Roxanne Mins, MD

## 2019-08-29 NOTE — ED Notes (Signed)
ED TO INPATIENT HANDOFF REPORT  Name/Age/Gender Veronica [name] 48 y.o. female  Code Status   Home/SNF/Other Home  Chief Complaint Fever; Tacyacardia  Level of Care/Admitting Diagnosis ED Disposition    ED Disposition Condition Comment   Admit  Hospital Area: [place] [id number]  Level of Care: Telemetry [5]  Admit to tele based on following criteria: Complex arrhythmia (Bradycardia/Tachycardia)  Covid Evaluation: Person Under Investigation (PUI)  Diagnosis: [facility]is [id number]  Admitting Physician: Shela Leff [id number]  Attending Physician: Shela Leff [id number]  Estimated length of stay: past midnight tomorrow  Certification:: I certify this patient will need inpatient services for at least 2 midnights  PT Class (Do Not Modify): Inpatient [101]  PT Acc Code (Do Not Modify): Private [1]       Medical History Past Medical History:  Diagnosis Date  . Cancer ([place])   . Chronic low back pain with left-sided sciatica   . Family history of leukemia   . Family history of stomach cancer   . Hypertension     Allergies No Known Allergies  IV Location/Drains/Wounds Patient Lines/Drains/Airways Status   Active Line/Drains/Airways    Name:   Placement date:   Placement time:   Site:   Days:   Implanted Port 06/12/19 Right Chest   06/12/19    1039    Chest   78   Peripheral IV 08/29/19 Left Antecubital   08/29/19    0009    Antecubital   less than 1   Peripheral IV 08/29/19 Left Hand   08/29/19    0046    Hand   less than 1   Incision (Closed) 06/12/19 Chest Right   06/12/19    1058     78   Incision (Closed) 06/12/19 Breast Right   06/12/19    1211     78   Incision (Closed) 07/17/19 Breast Right   07/17/19    1314     43          Labs/Imaging Results for orders placed or performed during the hospital encounter of 08/28/19 (from the past 48 hour(s))  Urinalysis, Routine w reflex microscopic     Status: Abnormal   Collection  Time: 08/28/19 11:26 PM  Result Value Ref Range   Color, Urine YELLOW YELLOW   APPearance CLOUDY (A) CLEAR   Specific Gravity, Urine 1.013 1.005 - 1.030   pH 5.0 5.0 - 8.0   Glucose, UA NEGATIVE NEGATIVE mg/dL   Hgb urine dipstick MODERATE (A) NEGATIVE   Bilirubin Urine NEGATIVE NEGATIVE   Ketones, ur NEGATIVE NEGATIVE mg/dL   Protein, ur NEGATIVE NEGATIVE mg/dL   Nitrite NEGATIVE NEGATIVE   Leukocytes,Ua MODERATE (A) NEGATIVE   RBC / HPF 21-50 0 - 5 RBC/hpf   WBC, UA 11-20 0 - 5 WBC/hpf   Bacteria, UA FEW (A) NONE SEEN   Squamous Epithelial / LPF 6-10 0 - 5    Comment: Performed at [facility], [place] [address] [place], [place] [address]  Lactic acid, plasma     Status: Abnormal   Collection Time: 08/29/19 12:00 AM  Result Value Ref Range   Lactic Acid, Venous 2.3 (HH) 0.5 - 1.9 mmol/L    Comment: CRITICAL RESULT CALLED TO, READ BACK BY AND VERIFIED WITH: Verne Lanuza,S @ 0047 ON [id number] BY POTEAT,S Performed at [facility] [address], [place], [place] [address]   Comprehensive metabolic panel     Status: Abnormal   Collection Time:  08/29/19 12:00 AM  Result Value Ref Range   Sodium 133 (L) 135 - 145 mmol/L   Potassium 4.0 3.5 - 5.1 mmol/L   Chloride 102 98 - 111 mmol/L   CO2 22 22 - 32 mmol/L   Glucose, Bld 197 (H) 70 - 99 mg/dL   BUN 15 6 - 20 mg/dL   Creatinine, Ser 0.81 0.44 - 1.00 mg/dL   Calcium 8.9 8.9 - 10.3 mg/dL   Total Protein 6.5 6.5 - 8.1 g/dL   Albumin 3.7 3.5 - 5.0 g/dL   AST 18 15 - 41 U/L   ALT 39 0 - 44 U/L   Alkaline Phosphatase 80 38 - 126 U/L   Total Bilirubin 1.6 (H) 0.3 - 1.2 mg/dL   GFR calc non Af Amer >60 >60 mL/min   GFR calc Af Amer >60 >60 mL/min   Anion gap 9 5 - 15    Comment: Performed at [facility], [place] [address], [place], [place] [address]  CBC WITH DIFFERENTIAL     Status: Abnormal   Collection Time: 08/29/19 12:00 AM  Result Value Ref Range   WBC 0.6 (LL) 4.0 - 10.5 K/uL     Comment: REPEATED TO VERIFY WHITE COUNT CONFIRMED ON SMEAR THIS CRITICAL RESULT HAS VERIFIED AND BEEN CALLED TO RN S Jersee Winiarski BY ALEXIS [place] ON 11 18 2020 AT 0116, AND HAS BEEN READ BACK.     RBC 3.06 (L) 3.87 - 5.11 MIL/uL   Hemoglobin 8.9 (L) 12.0 - 15.0 g/dL   HCT 26.8 (L) 36.0 - 46.0 %   MCV 87.6 80.0 - 100.0 fL   MCH 29.1 26.0 - 34.0 pg   MCHC 33.2 30.0 - 36.0 g/dL   RDW 12.8 11.5 - 15.5 %   Platelets 92 (L) 150 - 400 K/uL    Comment: REPEATED TO VERIFY SPECIMEN CHECKED FOR CLOTS Immature Platelet Fraction may be clinically indicated, consider ordering this additional test [id number]    nRBC 0.0 0.0 - 0.2 %   Neutrophils Relative % 33 %   Neutro Abs 0.2 (L) 1.7 - 7.7 K/uL   Lymphocytes Relative 43 %   Lymphs Abs 0.3 (L) 0.7 - 4.0 K/uL   Monocytes Relative 18 %   Monocytes Absolute 0.1 0.1 - 1.0 K/uL   Eosinophils Relative 0 %   Eosinophils Absolute 0.0 0.0 - 0.5 K/uL   Basophils Relative 2 %   Basophils Absolute 0.0 0.0 - 0.1 K/uL   Immature Granulocytes 4 %   Abs Immature Granulocytes 0.02 0.00 - 0.07 K/uL   Tear Drop Cells PRESENT     Comment: Performed at [facility], [place] [address] [place], [place] [address]  APTT     Status: None   Collection Time: 08/29/19 12:00 AM  Result Value Ref Range   aPTT 25 24 - 36 seconds    Comment: Performed at [facility], [place] [address], [place], [place] [address]  Protime-INR     Status: None   Collection Time: 08/29/19 12:00 AM  Result Value Ref Range   Prothrombin Time 13.2 11.4 - 15.2 seconds   INR 1.0 0.8 - 1.2    Comment: (NOTE) INR goal varies based on device and disease states. Performed at [facility], [place] [address] [place], [place] [address]   POC occult blood, ED Provider will collect     Status: Abnormal   Collection Time: 08/29/19 12:06 AM  Result Value Ref Range  Fecal Occult Bld POSITIVE (A) NEGATIVE   Dg Chest Port 1 View  Result Date:  08/28/2019 CLINICAL DATA:  Cough and fever. Active chemotherapy for breast cancer. EXAM: PORTABLE CHEST 1 VIEW COMPARISON:  Radiograph 06/12/2019 FINDINGS: Right chest port remains in place. Unchanged heart size and mediastinal contours. No focal airspace disease, pleural effusion, or pneumothorax. No acute osseous abnormalities are seen. IMPRESSION: No acute chest findings. Electronically Signed   By: Keith Rake M.D.   On: 08/28/2019 23:43    Pending Labs Unresulted Labs (From admission, onward)    Start     Ordered   08/28/19 2338  SARS CORONAVIRUS 2 (TAT 6-24 HRS) Nasopharyngeal Nasopharyngeal Swab  (Asymptomatic/Tier 2 Patients Labs)  Once,   STAT    Question Answer Comment  Is this test for diagnosis or screening Screening   Symptomatic for COVID-19 as defined by CDC No   Hospitalized for COVID-19 No   Admitted to ICU for COVID-19 No   Previously tested for COVID-19 Yes   Resident in a congregate (group) care setting No   Employed in healthcare setting No   Pregnant No      08/28/19 2338   08/28/19 2326  Lactic acid, plasma  Now then every 2 hours,   STAT     08/28/19 2327   08/28/19 2326  Blood Culture (routine x 2)  BLOOD CULTURE X 2,   STAT     08/28/19 2327   08/28/19 2326  Urine culture  ONCE - STAT,   STAT     08/28/19 2327          Vitals/Pain Today's Vitals   08/29/19 0122 08/29/19 0153 08/29/19 0201 08/29/19 0228  BP:  (!) 119/54    Pulse:  99    Resp:  18    Temp:   98.6 F (37 C)   TempSrc:   Oral   SpO2:  97%    PainSc: 0-No pain   0-No pain    Isolation Precautions No active isolations  Medications Medications  lidocaine (XYLOCAINE) 2 % jelly 1 application (has no administration in time range)  vancomycin (VANCOCIN) 2,000 mg in sodium chloride 0.9 % 500 mL IVPB (2,000 mg Intravenous New Bag/Given 08/29/19 0159)  acetaminophen (TYLENOL) tablet 1,000 mg (1,000 mg Oral Given 08/29/19 0011)  sodium chloride 0.9 % bolus 500 mL (0 mLs Intravenous  Stopped 08/29/19 0050)  sodium chloride 0.9 % bolus 1,000 mL (0 mLs Intravenous Stopped 08/29/19 0143)    And  sodium chloride 0.9 % bolus 1,000 mL (0 mLs Intravenous Stopped 08/29/19 0143)    And  sodium chloride 0.9 % bolus 1,000 mL (1,000 mLs Intravenous New Bag/Given 08/29/19 0110)  ceFEPIme (MAXIPIME) 2 g in sodium chloride 0.9 % 100 mL IVPB (0 g Intravenous Stopped 08/29/19 0228)    Mobility walks

## 2019-08-29 NOTE — ED Notes (Signed)
Date and time results received: 08/29/19 0047  Test: lactic  Critical Value: 2.3  Name of Provider Notified: Maree Erie, [place] and Roxanne Mins, MD

## 2019-08-29 NOTE — H&P (Addendum)
History and Physical    Veronica [name] [id number] DOB: August 09, 1971 DOA: 08/28/2019  PCP: Blair Heys, PA-C Patient coming from: Home  Chief Complaint: Fevers, chills  HPI: Veronica [name] is a 48 y.o. female with medical history significant of breast cancer on adjuvant chemotherapy status post right lumpectomy, chronic lower back pain with left-sided sciatica, hypertension presenting to the ED with a chief complaint of fevers and chills.  Patient states last night she started having fevers and chills.  She had a temperature above 101 F at home.  Her heart was racing and her blood pressure was low.  When she checked her blood pressure systolic was in the 0000000 and diastolic in the 123456.  She has been coughing since yesterday but has not had any shortness of breath.  No nausea, vomiting, or abdominal pain.  She has been having diarrhea since she was started on chemotherapy for which she takes Imodium as needed.  Denies dysuria, urinary frequency, or urgency.  States she has been noticing small amounts of bright red blood with her stool.  ED Course: Temperature 102.6 F and tachycardic.  Not hypotensive.  WBC count 0.6 and evidence of neutropenia and lymphopenia.  Lactic acid 2.3 >1.4 with IV fluid resuscitation.  Hemoglobin 8.9, was 10.1 about 8 days ago.  Platelet count 92,000, previously low about a month ago but was normal on labs done 8 days ago. UA with negative nitrite, moderate amount of leukocytes, 11-20 WBCs, and few bacteria.  Urine culture pending. FOBT positive.  Noted to have nonthrombosed external hemorrhoids and palpable internal hemorrhoids on rectal exam done in the ED.  No gross rectal bleeding. Chest x-ray showing no acute chest findings. Blood culture x2 pending.  SARS-CoV-2 test pending. Patient received Tylenol, vancomycin, cefepime, and 3 L normal saline boluses.  Review of Systems:  All systems reviewed and apart from history of presenting illness, are negative.  Past  Medical History:  Diagnosis Date  . Cancer ([place])   . Chronic low back pain with left-sided sciatica   . Family history of leukemia   . Family history of stomach cancer   . Hypertension     Past Surgical History:  Procedure Laterality Date  . BREAST BIOPSY Right 05/02/2019   right clips X2  . BREAST LUMPECTOMY WITH RADIOACTIVE SEED AND SENTINEL LYMPH NODE BIOPSY Right 06/12/2019   Procedure: RIGHT BREAST RADIOACTIVE SEED LUMPECTOMY  AND RIGHT AXILLARY SEED TARGETED LYMPH NODE AND AXILLARY SENTINEL LYMPH NODE MAPPING;  Surgeon: Erroll Luna, MD;  Location: [facility];  Service: General;  Laterality: Right;  PEC BLOCK  . C/S x2  '98 '03  . CESAREAN SECTION    . CHOLECYSTECTOMY    . LAPAROSCOPIC ASSISTED VAGINAL HYSTERECTOMY  05/17/2011   Procedure: LAPAROSCOPIC ASSISTED VAGINAL HYSTERECTOMY;  Surgeon: Sharene Butters;  Location: [facility];  Service: Gynecology;  Laterality: N/A;  Laparoscopic Assisted Vaginal Hysterectomy With Lysis Of Adhesions  . PORTACATH PLACEMENT Right 06/12/2019   Procedure: INSERTION PORT-A-CATH WITH ULTRASOUND;  Surgeon: Erroll Luna, MD;  Location: [facility];  Service: General;  Laterality: Right;  . RE-EXCISION OF BREAST LUMPECTOMY Right 07/17/2019   Procedure: RE-EXCISION OF RIGHT BREAST LUMPECTOMY;  Surgeon: Erroll Luna, MD;  Location: [facility];  Service: General;  Laterality: Right;  . TUBAL LIGATION       reports that she has never smoked. She has never used smokeless tobacco. She reports current alcohol use of about 2.0 standard drinks of alcohol  per week. She reports that she does not use drugs.  No Known Allergies  Family History  Problem Relation Age of Onset  . Arthritis Mother   . COPD Mother   . Hypertension Mother   . Hyperlipidemia Mother   . Leukemia Brother 53  . Stomach cancer Maternal Grandmother        late 21s    Prior to Admission medications   Medication Sig Start Date End Date  Taking? Authorizing Provider  acetaminophen (TYLENOL) 500 MG tablet Take 500 mg by mouth every 6 (six) hours as needed for mild pain.    Yes [provider]  Cholecalciferol (VITAMIN D3) 25 MCG (1000 UT) CHEW Chew 1 tablet by mouth daily.    Yes [provider]  ciprofloxacin (CIPRO) 500 MG tablet Take 1 tablet (500 mg total) by mouth 2 (two) times daily. 08/07/19  Yes Causey, Charlestine Massed, NP  dexamethasone (DECADRON) 4 MG tablet TAKE TWO TABLETS BY MOUTH ONCE A DAY ON THE DAY AFTER CHEMOTHERAPY AND THEN TAKE TWO TABLETS BY MOUTH TWICE DAILY FOR 2 DAYS 08/22/19  Yes Magrinat, Virgie Dad, MD  famotidine (PEPCID) 10 MG tablet Take 10 mg by mouth 2 (two) times daily.   Yes [provider]  lidocaine-prilocaine (EMLA) cream Apply to affected area once 06/21/19  Yes Magrinat, Virgie Dad, MD  loratadine (CLARITIN) 10 MG tablet Take 10 mg by mouth See admin instructions. Takes for 5 days after chemo   Yes [provider]  LORazepam (ATIVAN) 0.5 MG tablet Take 1 tablet (0.5 mg total) by mouth at bedtime as needed (Nausea or vomiting). 06/21/19  Yes Magrinat, Virgie Dad, MD  olmesartan-hydrochlorothiazide (BENICAR HCT) 20-12.5 MG tablet Take 1 tablet by mouth daily.   Yes [provider]  ondansetron (ZOFRAN) 8 MG tablet Take 1 tablet (8 mg total) by mouth every 8 (eight) hours as needed for nausea or vomiting. 08/21/19  Yes Magrinat, Virgie Dad, MD  prochlorperazine (COMPAZINE) 10 MG tablet Take 1 tablet (10 mg total) by mouth every 6 (six) hours as needed (Nausea or vomiting). 06/21/19  Yes Magrinat, Virgie Dad, MD    Physical Exam: Vitals:   08/29/19 0153 08/29/19 0201 08/29/19 0356 08/29/19 0358  BP: (!) 119/54  118/61   Pulse: 99  (!) 107   Resp: 18  18   Temp:  98.6 F (37 C) 98.4 F (36.9 C)   TempSrc:  Oral Oral   SpO2: 97%  100%   Weight:    100.7 kg  Height:    5\' 1"  (1.549 m)    Physical Exam  Constitutional: She is oriented to person, place, and  time. She appears well-developed and well-nourished. No distress.  HENT:  Head: Normocephalic.  Eyes: Right eye exhibits no discharge. Left eye exhibits no discharge.  Neck: Neck supple.  Cardiovascular: Normal rate, regular rhythm and intact distal pulses.  Pulmonary/Chest: Effort normal and breath sounds normal. No respiratory distress. She has no wheezes. She has no rales.  Abdominal: Soft. Bowel sounds are normal. She exhibits no distension. There is no abdominal tenderness. There is no guarding.  Musculoskeletal:        General: No edema.  Neurological: She is alert and oriented to person, place, and time.  Skin: Skin is warm and dry. She is not diaphoretic.  Port-A-Cath site on the right chest wall without tenderness or signs of infection     Labs on Admission: I have personally reviewed following labs and imaging studies  CBC: Recent Labs  Lab 08/29/19 0000  WBC 0.6*  NEUTROABS 0.2*  HGB 8.9*  HCT 26.8*  MCV 87.6  PLT 92*   Basic Metabolic Panel: Recent Labs  Lab 08/29/19 0000  NA 133*  K 4.0  CL 102  CO2 22  GLUCOSE 197*  BUN 15  CREATININE 0.81  CALCIUM 8.9   GFR: Estimated Creatinine Clearance: 94.5 mL/min (by C-G formula based on SCr of 0.81 mg/dL). Liver Function Tests: Recent Labs  Lab 08/29/19 0000  AST 18  ALT 39  ALKPHOS 80  BILITOT 1.6*  PROT 6.5  ALBUMIN 3.7   No results for input(s): LIPASE, AMYLASE in the last 168 hours. No results for input(s): AMMONIA in the last 168 hours. Coagulation Profile: Recent Labs  Lab 08/29/19 0000  INR 1.0   Cardiac Enzymes: No results for input(s): CKTOTAL, CKMB, CKMBINDEX, TROPONINI in the last 168 hours. BNP (last 3 results) No results for input(s): PROBNP in the last 8760 hours. HbA1C: No results for input(s): HGBA1C in the last 72 hours. CBG: No results for input(s): GLUCAP in the last 168 hours. Lipid Profile: No results for input(s): CHOL, HDL, LDLCALC, TRIG, CHOLHDL, LDLDIRECT in the last  72 hours. Thyroid Function Tests: No results for input(s): TSH, T4TOTAL, FREET4, T3FREE, THYROIDAB in the last 72 hours. Anemia Panel: No results for input(s): VITAMINB12, FOLATE, FERRITIN, TIBC, IRON, RETICCTPCT in the last 72 hours. Urine analysis:    Component Value Date/Time   COLORURINE YELLOW 08/28/2019 2326   APPEARANCEUR CLOUDY (A) 08/28/2019 2326   LABSPEC 1.013 08/28/2019 2326   PHURINE 5.0 08/28/2019 2326   GLUCOSEU NEGATIVE 08/28/2019 2326   HGBUR MODERATE (A) 08/28/2019 2326   BILIRUBINUR NEGATIVE 08/28/2019 2326   KETONESUR NEGATIVE 08/28/2019 2326   PROTEINUR NEGATIVE 08/28/2019 2326   NITRITE NEGATIVE 08/28/2019 2326   LEUKOCYTESUR MODERATE (A) 08/28/2019 2326    Radiological Exams on Admission: Dg Chest Port 1 View  Result Date: 08/28/2019 CLINICAL DATA:  Cough and fever. Active chemotherapy for breast cancer. EXAM: PORTABLE CHEST 1 VIEW COMPARISON:  Radiograph 06/12/2019 FINDINGS: Right chest port remains in place. Unchanged heart size and mediastinal contours. No focal airspace disease, pleural effusion, or pneumothorax. No acute osseous abnormalities are seen. IMPRESSION: No acute chest findings. Electronically Signed   By: Keith Rake M.D.   On: 08/28/2019 23:43    EKG: Independently reviewed.  Sinus tachycardia, heart rate 124.  Assessment/Plan Principal Problem:   Sepsis ([place]) Active Problems:   Neutropenia with fever (H[facility])   Hematochezia   Anemia   Thrombocytopenia ([facility])   Sepsis secondary to possible UTI in a neutropenic patient Patient is currently on chemotherapy for breast cancer.  Labs suggestive of severe neutropenia.  Febrile and tachycardic.  Not hypotensive.  Mild lactic acidosis resolved with IV fluid resuscitation.  Heart rate now improved.  Chest x-ray not suggestive of pneumonia.  UTI could be a possible source of infection as UA showing negative nitrite, moderate amount of leukocytes, 11-20 WBCs, and few bacteria.  Although patient is  not endorsing any UTI symptoms.  Port-A-Cath access site without signs of infection. -Received 3 L normal saline boluses per sepsis protocol.  Continue IV fluid hydration. -Continue broad-spectrum antibiotics including vancomycin and cefepime -Continue to monitor CBC -Urine culture pending -Blood culture x2 pending -SARS-CoV-2 test pending.  Continue airborne and contact precautions.  Hematochezia and acute blood loss anemia, suspect secondary to hemorrhoids Suspect related to hemorrhoids. Hemoglobin 8.9, was 10.1 about 8 days ago. FOBT positive.  Noted to have nonthrombosed external hemorrhoids and palpable internal hemorrhoids on rectal exam done in the ED.  No gross rectal bleeding.  Currently hemodynamically stable. -Continue to monitor hemoglobin and hematocrit -Increase dietary fiber intake/ psyllium -Avoid constipation -Anusol HC suppository -Lidocaine jelly as needed  Thrombocytopenia Likely related to chemotherapy.  Platelet count 92,000, previously low about a month ago but was normal on labs done 8 days ago. -Continue to monitor  Breast cancer on adjuvant chemotherapy status post lumpectomy -Ensure oncology follow-up  HIV screening The patient falls between the ages of 13-64 and should be screened for HIV, therefore HIV testing ordered.  DVT prophylaxis: SCDs at this time Code Status: Full code Family Communication: No family available. Disposition Plan: Anticipate discharge after clinical improvement. Consults called: None Admission status: It is my clinical opinion that admission to INPATIENT is reasonable and necessary in this 48 y.o. female . presenting with sepsis secondary to possible UTI.  Patient is currently receiving chemotherapy for breast cancer and is neutropenic.  On broad-spectrum antibiotics.  SARS-CoV-2 test pending.  High risk of decompensation.  Given the aforementioned, the predictability of an adverse outcome is felt to be significant. I expect that  the patient will require at least 2 midnights in the hospital to treat this condition.   The medical decision making on this patient was of high complexity and the patient is at high risk for clinical deterioration, therefore this is a level 3 visit.  Shela Leff MD [facility] Pager [phone]  If 7PM-7AM, please contact night-coverage [email or web address] Password [email or web address]  08/29/2019, 4:59 AM

## 2019-08-29 NOTE — Plan of Care (Signed)
48 yo female with history of ER positive right breast ca status post right lumpectomy and chemo cyclophosphamide and doxorubicin last infusion 08/21/2019 admitted with fever chills admitted with fever and tachycardia.  She does have a productive cough with clear phlegm for few days prior to admission.  She was started on cefepime and Vanco.  Source of infection could be UTI.  Urine culture pending.  UA with moderate leukocytes few bacteria 11-20 WBCs.  Blood culture pending.  Lactic acid trending down.  Chest x-ray negative.  Hemoglobin on admission 8.9 down from 10.1 on 08/21/2019 probably related to dehydration, however with IV hydration her hemoglobin dropped to 7.3 with no signs of active bleeding.  WBC 0.6 with ANC of 0.1.  FOBT positive.  Platelets dropped to 72 from 92 at the time of admission.  She is not on any anticoagulation.  Blood pressure soft 109/56 tachycardic at 102 sats 100% on room air temp 99.1 this morning, T-max 102.6. Continue current antibiotics and follow-up culture.  Will add patient to Dr. Starleen Arms list

## 2019-08-29 NOTE — Progress Notes (Addendum)
HEMATOLOGY-ONCOLOGY PROGRESS NOTE  SUBJECTIVE: Veronica [name] presented to the emergency room with fevers and chills.  She had a temperature above 101 at home and also felt as though her heart was racing and had a low blood pressure which she checked at home.  She had been coughing but did not have shortness of breath.  She was also having diarrhea at home which has been ongoing since starting chemotherapy.  In the ER, she had a fever up to 102.6 and was tachycardic.  White blood cell count was 0.6, hemoglobin 8.9, platelets 92,000, ANC 0.2.  Fecal occult blood testing was positive-known hemorrhoids. Lactic acid level was elevated on admission at 2.3.  Urine culture and blood cultures were obtained and are pending.  Chest x-ray did not show any acute findings.  COVID-19 testing was negative.  She was started on IV antibiotics.  Veronica [name] has been receiving adjuvant chemotherapy with Adriamycin and Cytoxan which was last given on 08/21/2019.  She is receiving Neulasta with her chemotherapy.  Today, Zykera tells me that she feels significantly better than when she arrived at the hospital.  She was having fevers and chills at home.  She has not had a recurrent fever since arriving in the ER and has not had any recurrent chills.  She reports an intermittent cough productive of clear/white sputum.  Denies mucositis, abdominal pain, nausea, vomiting.  She has baseline diarrhea which she has had since adding chemotherapy.  She uses Imodium.  Notices some bright red blood after a bowel movement.  She has known hemorrhoids.  No other bleeding has been noted.  Oncology History  Malignant neoplasm of upper-outer quadrant of right breast in female, estrogen receptor positive ([place])  05/07/2019 Initial Diagnosis   Malignant neoplasm of upper-outer quadrant of right breast in female, estrogen receptor positive ([place])   05/16/2019 Genetic Testing   No pathogenic variants detected on the [facility] Common Hereditary Cancers panel. A variant of  uncertain significance (VUS) was detected in one of her MSH6 genes (c.831A>C).  The Common Hereditary Gene Panel offered by [facility] includes sequencing and/or deletion duplication testing of the following 48 genes: APC, ATM, AXIN2, BARD1, BMPR1A, BRCA1, BRCA2, BRIP1, CDH1, CDK4, CDKN2A (p14ARF), CDKN2A (p16INK4a), CHEK2, CTNNA1, DICER1, EPCAM (Deletion/duplication testing only), GREM1 (promoter region deletion/duplication testing only), KIT, MEN1, MLH1, MSH2, MSH3, MSH6, MUTYH, NBN, NF1, NHTL1, PALB2, PDGFRA, PMS2, POLD1, POLE, PTEN, RAD50, RAD51C, RAD51D, RNF43, SDHB, SDHC, SDHD, SMAD4, SMARCA4. STK11, TP53, TSC1, TSC2, and VHL.  The following genes were evaluated for sequence changes only: SDHA and HOXB13 c.251G>A variant only.    07/24/2019 -  Chemotherapy   The patient had DOXOrubicin (ADRIAMYCIN) chemo injection 126 mg, 60 mg/m2 = 126 mg, Intravenous,  Once, 3 of 4 cycles Administration: 126 mg (07/24/2019), 126 mg (08/07/2019), 126 mg (08/21/2019) palonosetron (ALOXI) injection 0.25 mg, 0.25 mg, Intravenous,  Once, 3 of 4 cycles Administration: 0.25 mg (07/24/2019), 0.25 mg (08/07/2019), 0.25 mg (08/21/2019) pegfilgrastim (NEULASTA ONPRO KIT) injection 6 mg, 6 mg, Subcutaneous, Once, 3 of 4 cycles Administration: 6 mg (07/24/2019), 6 mg (08/07/2019) cyclophosphamide (CYTOXAN) 1,260 mg in sodium chloride 0.9 % 250 mL chemo infusion, 600 mg/m2 = 1,260 mg, Intravenous,  Once, 3 of 4 cycles Administration: 1,260 mg (07/24/2019), 1,260 mg (08/07/2019), 1,260 mg (08/21/2019) PACLitaxel (TAXOL) 168 mg in sodium chloride 0.9 % 250 mL chemo infusion (</= 87m/m2), 80 mg/m2, Intravenous,  Once, 0 of 12 cycles fosaprepitant (EMEND) 150 mg, dexamethasone (DECADRON) 12 mg in sodium chloride 0.9 % 145 mL IVPB, ,  Intravenous,  Once, 3 of 4 cycles Administration:  (07/24/2019),  (08/07/2019),  (08/21/2019)  for chemotherapy treatment.       REVIEW OF SYSTEMS:   Constitutional: She had fevers and chills  prior to admission. Respiratory: Reports cough with clear/white sputum production.  No shortness of breath. Cardiovascular: Denies palpitation, chest discomfort Gastrointestinal:  Denies nausea, heartburn.  Has diarrhea which is consistent with her baseline. Skin: Denies abnormal skin rashes Lymphatics: Denies new lymphadenopathy or easy bruising Neurological:Denies numbness, tingling or new weaknesses Behavioral/Psych: Mood is stable, no new changes  Extremities: No lower extremity edema All other systems were reviewed with the patient and are negative.  I have reviewed the past medical history, past surgical history, social history and family history with the patient and they are unchanged from previous note.   PHYSICAL EXAMINATION: ECOG PERFORMANCE STATUS: 1 - Symptomatic but completely ambulatory  Vitals:   08/29/19 0356 08/29/19 0620  BP: 118/61 (!) 109/56  Pulse: (!) 107 (!) 102  Resp: 18 18  Temp: 98.4 F (36.9 C) 99.1 F (37.3 C)  SpO2: 100% 100%   Filed Weights   08/29/19 0358  Weight: 222 lb 0.1 oz (100.7 kg)    Intake/Output from previous day: 11/17 0701 - 11/18 0700 In: 647.2 [I.V.:47.2; IV Piggyback:600] Out: -   GENERAL:alert, no distress and comfortable SKIN: skin color, texture, turgor are normal, no rashes or significant lesions EYES: normal, Conjunctiva are pink and non-injected, sclera clear OROPHARYNX:no exudate, no erythema and lips, buccal mucosa, and tongue normal  NECK: supple, thyroid normal size, non-tender, without nodularity LYMPH:  no palpable lymphadenopathy in the cervical, axillary or inguinal CHEST: Port-A-Cath site without redness or tenderness LUNGS: clear to auscultation and percussion with normal breathing effort HEART: regular rate & rhythm and no murmurs and no lower extremity edema ABDOMEN:abdomen soft, non-tender and normal bowel sounds Musculoskeletal:no cyanosis of digits and no clubbing  NEURO: alert & oriented x 3 with  fluent speech, no focal motor/sensory deficits  LABORATORY DATA:  I have reviewed the data as listed CMP Latest Ref Rng & Units 08/29/2019 08/21/2019 08/07/2019  Glucose 70 - 99 mg/dL 197(H) 205(H) 151(H)  BUN 6 - 20 mg/dL '15 7 9  ' Creatinine 0.44 - 1.00 mg/dL 0.81 0.71 0.76  Sodium 135 - 145 mmol/L 133(L) 141 139  Potassium 3.5 - 5.1 mmol/L 4.0 3.3(L) 3.7  Chloride 98 - 111 mmol/L 102 105 106  CO2 22 - 32 mmol/L '22 24 23  ' Calcium 8.9 - 10.3 mg/dL 8.9 8.8(L) 9.2  Total Protein 6.5 - 8.1 g/dL 6.5 5.9(L) 6.4(L)  Total Bilirubin 0.3 - 1.2 mg/dL 1.6(H) 0.6 0.4  Alkaline Phos 38 - 126 U/L 80 70 78  AST 15 - 41 U/L 18 14(L) 17  ALT 0 - 44 U/L 39 18 34    Lab Results  Component Value Date   WBC 0.6 (LL) 08/29/2019   HGB 7.3 (L) 08/29/2019   HCT 22.2 (L) 08/29/2019   MCV 89.5 08/29/2019   PLT 72 (L) 08/29/2019   NEUTROABS 0.1 (L) 08/29/2019    Dg Chest Port 1 View  Result Date: 08/28/2019 CLINICAL DATA:  Cough and fever. Active chemotherapy for breast cancer. EXAM: PORTABLE CHEST 1 VIEW COMPARISON:  Radiograph 06/12/2019 FINDINGS: Right chest port remains in place. Unchanged heart size and mediastinal contours. No focal airspace disease, pleural effusion, or pneumothorax. No acute osseous abnormalities are seen. IMPRESSION: No acute chest findings. Electronically Signed   By: Aurther Loft.D.  On: 08/28/2019 23:43    ASSESSMENT: 48 y.o. [place], [place] woman status post right breast upper outer quadrant biopsy 05/01/2019 for a clinical T1c N1, stage IIA invasive ductal carcinoma, grade 3, estrogen and progesterone receptor positive, HER-2 nonamplified, with an MIB-1-1 of 20%.             (a) mass in the axillary tail was a positive lymph node  (1) MammaPrint obtained from the original biopsy shows a high risk luminal subtype B tumor  (2) genetics testing 05/09/2019 through the Common Hereditary Gene Panel offered by [facility] found no deleterious mutations in APC, ATM, AXIN2,  BARD1, BMPR1A, BRCA1, BRCA2, BRIP1, CDH1, CDK4, CDKN2A (p14ARF), CDKN2A (p16INK4a), CHEK2, CTNNA1, DICER1, EPCAM (Deletion/duplication testing only), GREM1 (promoter region deletion/duplication testing only), KIT, MEN1, MLH1, MSH2, MSH3, MSH6, MUTYH, NBN, NF1, NHTL1, PALB2, PDGFRA, PMS2, POLD1, POLE, PTEN, RAD50, RAD51C, RAD51D, RNF43, SDHB, SDHC, SDHD, SMAD4, SMARCA4. STK11, TP53, TSC1, TSC2, and VHL. The following genes were evaluated for sequence changes only: SDHA and HOXB13 c.251G>A variant only.             (a) A variant of uncertain significance (VUS) was detected in one of her MSH6 genes (c.831A>C).  (3) status post right lumpectomy and sentinel lymph node sampling 06/12/2019 for a pT2 pN1, stage IIA invasive ductal carcinoma, grade 2, with positive margins             (a) a total of 4 sentinel lymph nodes removed, one positive (with ECE), ine itc             (b) margin clearance 04/19/2019 successful medial margin close but negative for DCIS  (4)adjuvant chemotherapy will consist of doxorubicin and cyclophosphamide in dose dense fashion x4starting 09/29/2020followed by weekly paclitaxel x12             (a) echo 06/26/2019 shows an EF of 60-65%  (5)adjuvant radiationto follow  (6) antiestrogensto start at the completion of local treatment  (7) hospital admission 08/29/2019 -febrile neutropenia  PLAN:  Tracina has been admitted for febrile neutropenia.  Urinalysis suggestive of possible UTI.  She feels significantly better since receiving IV fluids and being started on IV antibiotics.  She is not having any recurrent fevers or chills since arrival.  Will await urine culture.  Blood cultures are negative to date and will continue to follow these.  Continue current IV antibiotics and de-escalate if cultures remain negative.  She has pancytopenia related to her recent chemotherapy.  She has already received Neulasta so there is no role for Granix.  Her hemoglobin today is 7.3.  This is  likely due to her recent chemotherapy, hydration, and blood loss secondary to hemorrhoids.  Transfuse PRBCs for hemoglobin less than 7.  Platelet count is 72,000 and her thrombocytopenia is also related to her chemotherapy and acute infection.  Continue close monitoring.  No platelet transfusion is indicated at this time.   LOS: 0 days   Mikey Bussing, DNP, AGPCNP-BC, AOCNP 08/29/19   ADDENDUM: Cherine is currently day 9 cycle 3 of her doxorubicin/cyclophosphamide chemotherapy, which she receives every 14 days. She had a temp >101 and a WBC of 0.6 on admission; cultures are negative, she is on appropriate ABX and received fluids for her low BP. She feels much better this afternoon.  She has had some loose BMs and some hemorrhoid problems, which could be the source. Her swallowinf difficulties have resolved with pepcid.  She received neulasta the day after chemo and I anticipate a rapid rise  in her WBC over the next 3 days.   She is scheduled for her last [facility] chemotherapy 11/25 but I will move that back a week (and drop the dose by 20%)  Greatly appreciate your help to this patient. Let me now if I can be of further help.   I personally saw this patient and performed a substantive portion of this encounter with the listed APP documented above.   Chauncey Cruel, MD Medical Oncology and Hematology [facility] [address] [place], [place] [address] Tel. [phone]    Fax. [phone]

## 2019-08-29 NOTE — ED Notes (Signed)
Pt aware that urine sample is needed.  

## 2019-08-29 NOTE — ED Notes (Signed)
Urine and culture sent to lab  

## 2019-08-29 NOTE — Progress Notes (Signed)
Pharmacy Antibiotic Note  Veronica [name] is a 48 y.o. female admitted on 08/28/2019 with Febrile Neutropenia.  Pharmacy has been consulted for Vancomycin, cefepime dosing.  Plan: Vancomycin 2gm iv x1, then Vancomycin 1500 mg IV Q 24 hrs. Goal AUC 400-550. Expected AUC: 507 SCr used: 0.81  Vd: 0.5  Cefepime 2gm iv x1, then 2gm iv q8hr   Height: 5\' 1"  (154.9 cm) Weight: 222 lb 0.1 oz (100.7 kg) IBW/kg (Calculated) : 47.8  Temp (24hrs), Avg:99.9 F (37.7 C), Min:98.4 F (36.9 C), Max:102.6 F (39.2 C)  Recent Labs  Lab 08/29/19 0000 08/29/19 0200  WBC 0.6*  --   CREATININE 0.81  --   LATICACIDVEN 2.3* 1.4    Estimated Creatinine Clearance: 94.5 mL/min (by C-G formula based on SCr of 0.81 mg/dL).    No Known Allergies  Antimicrobials this admission: Vancomycin 08/29/2019 >> Cefepime 08/29/2019 >>   Dose adjustments this admission: -  Microbiology results: -  Thank you for allowing pharmacy to be a part of this patient's care.  Nani Skillern Crowford 08/29/2019 5:06 AM

## 2019-08-29 NOTE — Progress Notes (Signed)
Initial Nutrition Assessment  RD working remotely.  DOCUMENTATION CODES:   Morbid obesity  INTERVENTION:   - Ensure Enlive po BID, each supplement provides 350 kcal and 20 grams of protein  NUTRITION DIAGNOSIS:   Increased nutrient needs related to acute illness, cancer and cancer related treatments as evidenced by estimated needs.  GOAL:   Patient will meet greater than or equal to 90% of their needs  MONITOR:   PO intake, Supplement acceptance, Labs, Weight trends  REASON FOR ASSESSMENT:   Malnutrition Screening Tool    ASSESSMENT:   48 year old female who presented to the ED on 11/17 with fever and chills. PMH of breast cancer currently undergoing chemotherapy s/p right lumpectomy, chronic lower back pain with left-sided sciatica, HTN. Pt admitted with sepsis.   Reviewed weight history in chart. Weight stable over the last 2.5 months.  No meal completions documented at this time.  Unable to reach pt via phone call to room. Reviewed ED provider note which states "She has had poor p.o. intake over the last week due to poor appetite, which she states is unusual after treatment as typically this will resolve within a few days."  Given pt's prior reports of poor appetite, RD will order oral nutrition supplements to aid pt in meeting kcal and protein needs.  Medications reviewed and include: Pepcid, psyllium, IV abx IVF: NS @ 125 ml/hr  Labs reviewed: sodium 133, hemoglobin 7.3  NUTRITION - FOCUSED PHYSICAL EXAM:  Unable to complete at this time. RD working remotely.  Diet Order:   Diet Order            Diet Heart Room service appropriate? Yes; Fluid consistency: Thin  Diet effective now              EDUCATION NEEDS:   No education needs have been identified at this time  Skin:  Skin Assessment: Reviewed RN Assessment (MASD bilateral breasts)  Last BM:  08/28/19  Height:   Ht Readings from Last 1 Encounters:  08/29/19 5\' 1"  (1.549 m)    Weight:    Wt Readings from Last 1 Encounters:  08/29/19 100.7 kg    Ideal Body Weight:  47.7 kg  BMI:  Body mass index is 41.95 kg/m.  Estimated Nutritional Needs:   Kcal:  2100-2300  Protein:  110-130 grams  Fluid:  >/= 2.0 L    Gaynell Face, MS, RD, LDN Inpatient Clinical Dietitian Pager: [phone] Weekend/After Hours: [phone]

## 2019-08-30 ENCOUNTER — Inpatient Hospital Stay (HOSPITAL_COMMUNITY): Payer: 59

## 2019-08-30 LAB — CBC
HCT: 23.5 % — ABNORMAL LOW (ref 36.0–46.0)
Hemoglobin: 7.7 g/dL — ABNORMAL LOW (ref 12.0–15.0)
MCH: 29.2 pg (ref 26.0–34.0)
MCHC: 32.8 g/dL (ref 30.0–36.0)
MCV: 89 fL (ref 80.0–100.0)
Platelets: 87 10*3/uL — ABNORMAL LOW (ref 150–400)
RBC: 2.64 MIL/uL — ABNORMAL LOW (ref 3.87–5.11)
RDW: 13 % (ref 11.5–15.5)
WBC: 1.5 10*3/uL — ABNORMAL LOW (ref 4.0–10.5)
nRBC: 4.8 % — ABNORMAL HIGH (ref 0.0–0.2)

## 2019-08-30 LAB — MAGNESIUM: Magnesium: 1.8 mg/dL (ref 1.7–2.4)

## 2019-08-30 LAB — COMPREHENSIVE METABOLIC PANEL
ALT: 35 U/L (ref 0–44)
AST: 15 U/L (ref 15–41)
Albumin: 3.2 g/dL — ABNORMAL LOW (ref 3.5–5.0)
Alkaline Phosphatase: 65 U/L (ref 38–126)
Anion gap: 10 (ref 5–15)
BUN: 8 mg/dL (ref 6–20)
CO2: 18 mmol/L — ABNORMAL LOW (ref 22–32)
Calcium: 8.3 mg/dL — ABNORMAL LOW (ref 8.9–10.3)
Chloride: 105 mmol/L (ref 98–111)
Creatinine, Ser: 0.52 mg/dL (ref 0.44–1.00)
GFR calc Af Amer: >60 mL/min (ref >60)
GFR calc non Af Amer: >60 mL/min (ref >60)
Glucose, Bld: 180 mg/dL — ABNORMAL HIGH (ref 70–99)
Potassium: 3.3 mmol/L — ABNORMAL LOW (ref 3.5–5.1)
Sodium: 133 mmol/L — ABNORMAL LOW (ref 135–145)
Total Bilirubin: 0.9 mg/dL (ref 0.3–1.2)
Total Protein: 5.8 g/dL — ABNORMAL LOW (ref 6.5–8.1)

## 2019-08-30 LAB — HIV ANTIBODY (ROUTINE TESTING W REFLEX): HIV Screen 4th Generation wRfx: NONREACTIVE — AB

## 2019-08-30 IMAGING — DX DG CHEST 1V
1 series · 1 of 1 positions shown · non-contrast
Comparison: [DATE]

CLINICAL DATA: Cough.  History of breast cancer.

EXAM:
CHEST  1 VIEW

[chest ap]
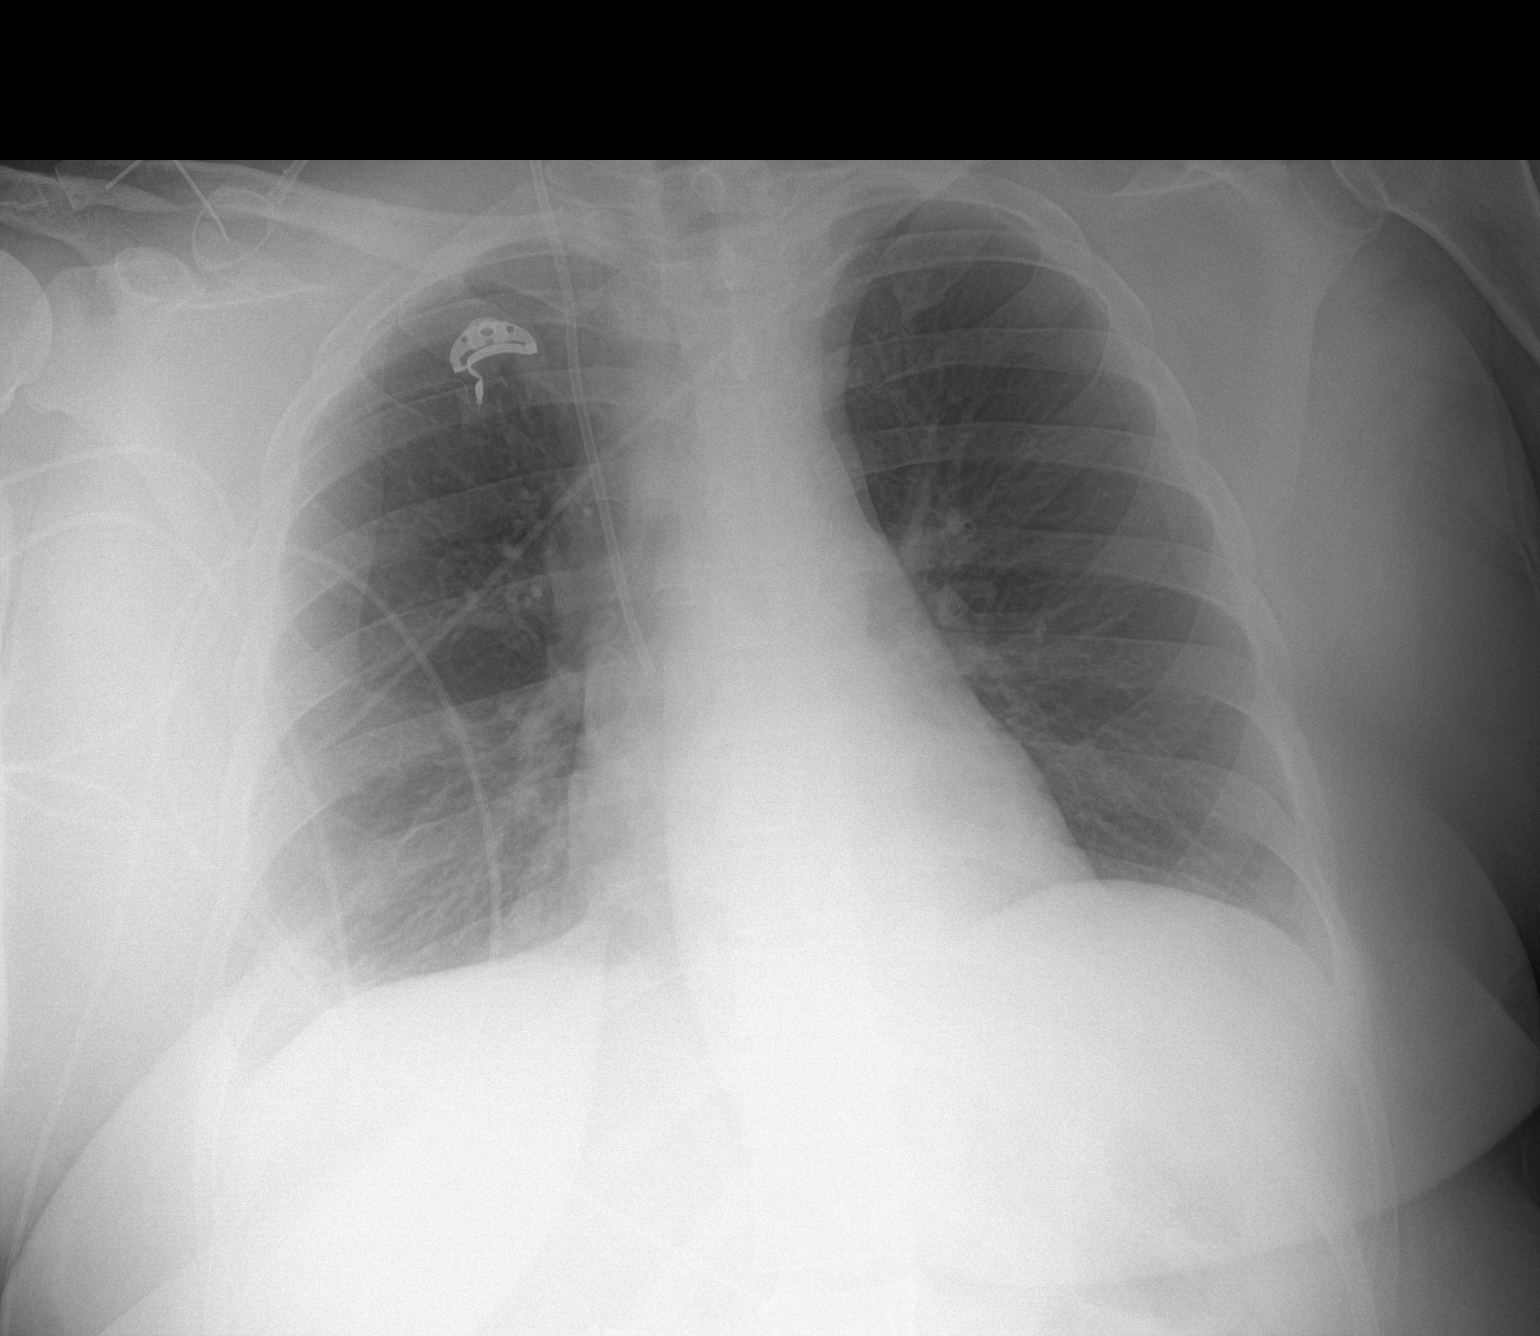

[1 of 1 positions shown; findings below may reference images not displayed]

FINDINGS: Stable position of the right jugular Port-A-Cath with the tip in the
lower SVC. Densities in the right lower chest appear to be related
to overlying breast tissue. No focal airspace disease. No evidence
for pulmonary edema. Heart and mediastinum are within normal limits.
Trachea is midline.
IMPRESSION: No acute cardiopulmonary disease.

## 2019-08-30 MED ORDER — IRBESARTAN 150 MG PO TABS
150.0000 mg | ORAL_TABLET | Freq: Every day | ORAL | Status: DC
  Administered 2019-08-30 – 2019-08-31 (×2): 150 mg via ORAL
  Filled 2019-08-30 (×2): qty 1

## 2019-08-30 MED ORDER — HYDROCHLOROTHIAZIDE 12.5 MG PO CAPS
12.5000 mg | ORAL_CAPSULE | Freq: Every day | ORAL | Status: DC
  Administered 2019-08-30 – 2019-08-31 (×2): 12.5 mg via ORAL
  Filled 2019-08-30 (×2): qty 1

## 2019-08-30 MED ORDER — POTASSIUM CHLORIDE CRYS ER 20 MEQ PO TBCR
40.0000 meq | EXTENDED_RELEASE_TABLET | Freq: Once | ORAL | Status: AC
  Administered 2019-08-30: 09:00:00 40 meq via ORAL
  Filled 2019-08-30: qty 2

## 2019-08-30 MED ORDER — PHENOL 1.4 % MT LIQD
1.0000 | OROMUCOSAL | Status: DC | PRN
  Filled 2019-08-30: qty 177

## 2019-08-30 MED ORDER — LOPERAMIDE HCL 2 MG PO CAPS
4.0000 mg | ORAL_CAPSULE | ORAL | Status: DC | PRN
  Administered 2019-08-30: 17:00:00 4 mg via ORAL
  Filled 2019-08-30: qty 2

## 2019-08-30 MED ORDER — MAGNESIUM SULFATE 2 GM/50ML IV SOLN
2.0000 g | Freq: Once | INTRAVENOUS | Status: AC
  Administered 2019-08-30: 11:00:00 2 g via INTRAVENOUS
  Filled 2019-08-30: qty 50

## 2019-08-30 NOTE — Progress Notes (Signed)
Called MD concerning BP home med, consulted with pharmacy,  Pharmacist will enter order, due to dual component. SRP, RN

## 2019-08-30 NOTE — Progress Notes (Signed)
PROGRESS NOTE    Veronica [name]  [id number] DOB: Dec 02, 1970 DOA: 08/28/2019 PCP: Blair Heys, PA-C    Brief Narrative: 48 y.o. female with medical history significant of breast cancer on adjuvant chemotherapy status post right lumpectomy, chronic lower back pain with left-sided sciatica, hypertension presenting to the ED with a chief complaint of fevers and chills.  Patient states last night she started having fevers and chills.  She had a temperature above 101 F at home.  Her heart was racing and her blood pressure was low.  When she checked her blood pressure systolic was in the 0000000 and diastolic in the 123456.  She has been coughing since yesterday but has not had any shortness of breath.  No nausea, vomiting, or abdominal pain.  She has been having diarrhea since she was started on chemotherapy for which she takes Imodium as needed.  Denies dysuria, urinary frequency, or urgency.  States she has been noticing small amounts of bright red blood with her stool.  ED Course: Temperature 102.6 F and tachycardic.  Not hypotensive.  WBC count 0.6 and evidence of neutropenia and lymphopenia.  Lactic acid 2.3 >1.4 with IV fluid resuscitation.  Hemoglobin 8.9, was 10.1 about 8 days ago.  Platelet count 92,000, previously low about a month ago but was normal on labs done 8 days ago. UA with negative nitrite, moderate amount of leukocytes, 11-20 WBCs, and few bacteria.  Urine culture pending. FOBT positive.  Noted to have nonthrombosed external hemorrhoids and palpable internal hemorrhoids on rectal exam done in the ED.  No gross rectal bleeding. Chest x-ray showing no acute chest findings. Blood culture x2 pending.  SARS-CoV-2 test negative Patient received Tylenol, vancomycin, cefepime, and 3 L normal saline boluses.  Assessment & Plan:   Principal Problem:   Sepsis ([place]) Active Problems:   Neutropenia with fever (HCC)   Hematochezia   Anemia   Thrombocytopenia (HCC)    #1 Neutropenic  fever -source still unknown Urine culture unremarkable Blood culture negative to date Repeat chest xray today negative again ..shadow Right lower lung field ?breast tissue Chest xray reviewed by me Covid negative Continue vancomycin and cefepime  fever curve coming down T-max 99.6 WBC improving 1.5 today  #2diarrhea after chemo-she takes lomotil at home requesting to restart.  Will restart Lomotil.  #3 hematochezia history of hemorrhoids resolving.  Continue Anusol HC and lidocaine jelly as needed.  Hemoglobin 7.7.  No evidence of any active bleeding.  #4 thrombocytopenia stable status post chemo  #5 breast CA status post lumpectomy on chemotherapy  #6 hyponatremia stable   Nutrition Problem: Increased nutrient needs Etiology: acute illness, cancer and cancer related treatments     Signs/Symptoms: estimated needs    Interventions: Ensure Enlive (each supplement provides 350kcal and 20 grams of protein)  Estimated body mass index is 41.95 kg/m as calculated from the following:   Height as of this encounter: 5\' 1"  (1.549 m).   Weight as of this encounter: 100.7 kg.  DVT prophylaxis: SCD  code Status: Full code  family Communication: None discussed with patient Disposition Plan: Pending clinical improvement  Consultants:   Oncology  Procedures: None Antimicrobials: Vanco and cefepime Subjective: Complaining of loose BM which is normal for her after chemotherapy.  Requesting Imodium to be restarted.  She denies any shortness of breath or cough.  She threw up her Ensure last night.  No further bleeding from hemorrhoids noted.  She feels like  her face is tight and swollen.  She  is able to swallow without any problem.  She has no dysphagia or odynophagia.  Objective: Vitals:   08/29/19 0620 08/29/19 1441 08/29/19 2039 08/30/19 0615  BP: (!) 109/56 (!) 110/54 114/75 121/67  Pulse: (!) 102 100 (!) 106 94  Resp: 18 20 20 20   Temp: 99.1 F (37.3 C) 99.2 F (37.3 C)  99.5 F (37.5 C) 99.6 F (37.6 C)  TempSrc: Oral Oral Oral Oral  SpO2: 100% 100% 100% 100%  Weight:      Height:        Intake/Output Summary (Last 24 hours) at 08/30/2019 1546 Last data filed at 08/30/2019 1500 Gross per 24 hour  Intake 970 ml  Output 0 ml  Net 970 ml   Filed Weights   08/29/19 0358  Weight: 100.7 kg    Examination: Oral mucosa moist no evidence of thrush no erythema General exam: Appears calm and comfortable  Respiratory system: Clear to auscultation. Respiratory effort normal.  Port site no evidence of erythema or edema or tenderness Cardiovascular system: S1 & S2 heard, RRR. No JVD, murmurs, rubs, gallops or clicks. No pedal edema. Gastrointestinal system: Abdomen is nondistended, soft and nontender. No organomegaly or masses felt. Normal bowel sounds heard. Central nervous system: Alert and oriented. No focal neurological deficits. Extremities: Trace edema bilaterally  skin: No rashes, lesions or ulcers Psychiatry: Judgement and insight appear normal. Mood & affect appropriate.     Data Reviewed: I have personally reviewed following labs and imaging studies  CBC: Recent Labs  Lab 08/29/19 0000 08/29/19 0644 08/30/19 0335  WBC 0.6* 0.6* 1.5*  NEUTROABS 0.2* 0.1*  --   HGB 8.9* 7.3* 7.7*  HCT 26.8* 22.2* 23.5*  MCV 87.6 89.5 89.0  PLT 92* 72* 87*   Basic Metabolic Panel: Recent Labs  Lab 08/29/19 0000 08/30/19 0335  NA 133* 133*  K 4.0 3.3*  CL 102 105  CO2 22 18*  GLUCOSE 197* 180*  BUN 15 8  CREATININE 0.81 0.52  CALCIUM 8.9 8.3*  MG  --  1.8   GFR: Estimated Creatinine Clearance: 95.7 mL/min (by C-G formula based on SCr of 0.52 mg/dL). Liver Function Tests: Recent Labs  Lab 08/29/19 0000 08/30/19 0335  AST 18 15  ALT 39 35  ALKPHOS 80 65  BILITOT 1.6* 0.9  PROT 6.5 5.8*  ALBUMIN 3.7 3.2*   No results for input(s): LIPASE, AMYLASE in the last 168 hours. No results for input(s): AMMONIA in the last 168  hours. Coagulation Profile: Recent Labs  Lab 08/29/19 0000  INR 1.0   Cardiac Enzymes: No results for input(s): CKTOTAL, CKMB, CKMBINDEX, TROPONINI in the last 168 hours. BNP (last 3 results) No results for input(s): PROBNP in the last 8760 hours. HbA1C: No results for input(s): HGBA1C in the last 72 hours. CBG: Recent Labs  Lab 08/29/19 2032  GLUCAP 181*   Lipid Profile: No results for input(s): CHOL, HDL, LDLCALC, TRIG, CHOLHDL, LDLDIRECT in the last 72 hours. Thyroid Function Tests: No results for input(s): TSH, T4TOTAL, FREET4, T3FREE, THYROIDAB in the last 72 hours. Anemia Panel: No results for input(s): VITAMINB12, FOLATE, FERRITIN, TIBC, IRON, RETICCTPCT in the last 72 hours. Sepsis Labs: Recent Labs  Lab 08/29/19 0000 08/29/19 0200  LATICACIDVEN 2.3* 1.4    Recent Results (from the past 240 hour(s))  Urine culture     Status: Abnormal   Collection Time: 08/28/19 11:26 PM   Specimen: In/Out Cath Urine  Result Value Ref Range Status   Specimen Description  Final    IN/OUT CATH URINE Performed at [facility], [place] [address] [place], [place] [address]    Special Requests   Final    NONE Performed at [facility], [place] [address], [place], [place] [address]    Culture MULTIPLE SPECIES PRESENT, SUGGEST RECOLLECTION (A)  Final   Report Status 08/29/2019 FINAL  Final  Blood Culture (routine x 2)     Status: None (Preliminary result)   Collection Time: 08/28/19 11:31 PM   Specimen: BLOOD  Result Value Ref Range Status   Specimen Description   Final    BLOOD LEFT ANTECUBITAL Performed at [place] [address], [place], [place] [address]    Special Requests   Final    BOTTLES DRAWN AEROBIC AND ANAEROBIC Blood Culture adequate volume Performed at [place] [address], [place], [place] [address]    Culture   Final    NO GROWTH 1 DAY Performed at [place], [place] [address] [place], [place] [address]    Report Status PENDING  Incomplete  SARS CORONAVIRUS 2 (TAT 6-24 HRS) Nasopharyngeal Nasopharyngeal Swab     Status: None   Collection Time: 08/28/19 11:38 PM   Specimen: Nasopharyngeal Swab  Result Value Ref Range Status   SARS Coronavirus 2 NEGATIVE NEGATIVE Final    Comment: (NOTE) SARS-CoV-2 target nucleic acids are NOT DETECTED. The SARS-CoV-2 RNA is generally detectable in upper and lower respiratory specimens during the acute phase of infection. Negative results do not preclude SARS-CoV-2 infection, do not rule out co-infections with other pathogens, and should not be used as the sole basis for treatment or other patient management decisions. Negative results must be combined with clinical observations, patient history, and epidemiological information. The expected result is Negative. Fact Sheet for Patients: SugarRoll.be Fact Sheet for Healthcare Providers: https://www.woods-.com/ This test is not yet approved or cleared by the [place] FDA and  has been authorized for detection and/or diagnosis of SARS-CoV-2 by FDA under an Emergency Use Authorization (EUA). This EUA will remain  in effect (meaning this test can be used) for the duration of the COVID-19 declaration under Section 56 4(b)(1) of the Act, 21 U.S.C. section 360bbb-3(b)(1), unless the authorization is terminated or revoked sooner. Performed at [facility], [place] [address] [place], [place] [address]   Blood Culture (routine x 2)     Status: None (Preliminary result)   Collection Time: 08/28/19 11:57 PM   Specimen: BLOOD  Result Value Ref Range Status   Specimen Description   Final    BLOOD RIGHT HAND Performed at [facility] [address], [place], [place] [address]    Special Requests   Final    BOTTLES DRAWN AEROBIC AND ANAEROBIC Blood Culture results may not be optimal due to an  inadequate volume of blood received in culture bottles Performed at [facility] [address], [place], [place] [address]    Culture   Final    NO GROWTH 1 DAY Performed at [facility], [place] [address], [place], [place] [address]    Report Status PENDING  Incomplete         Radiology Studies: Dg Chest 1 View  Result Date: 08/30/2019 CLINICAL DATA:  Cough.  History of breast cancer. EXAM: CHEST  1 VIEW COMPARISON:  08/28/2019 FINDINGS: Stable position of the right jugular Port-A-Cath with the tip in the lower SVC. Densities in  the right lower chest appear to be related to overlying breast tissue. No focal airspace disease. No evidence for pulmonary edema. Heart and mediastinum are within normal limits. Trachea is midline. IMPRESSION: No acute cardiopulmonary disease. Electronically Signed   By: Markus Daft M.D.   On: 08/30/2019 09:42   Dg Chest Port 1 View  Result Date: 08/28/2019 CLINICAL DATA:  Cough and fever. Active chemotherapy for breast cancer. EXAM: PORTABLE CHEST 1 VIEW COMPARISON:  Radiograph 06/12/2019 FINDINGS: Right chest port remains in place. Unchanged heart size and mediastinal contours. No focal airspace disease, pleural effusion, or pneumothorax. No acute osseous abnormalities are seen. IMPRESSION: No acute chest findings. Electronically Signed   By: Keith Rake M.D.   On: 08/28/2019 23:43        Scheduled Meds:  Chlorhexidine Gluconate Cloth  6 each Topical Daily   famotidine  10 mg Oral BID   feeding supplement (ENSURE ENLIVE)  237 mL Oral BID BM   hydrocortisone  25 mg Rectal BID   psyllium  1 packet Oral Daily   Continuous Infusions:  ceFEPime (MAXIPIME) IV 2 g (08/30/19 0902)   vancomycin 1,500 mg (08/29/19 2105)     LOS: 1 day     Georgette Shell, MD [facility]  If 7PM-7AM, please contact night-coverage [email or web address] Password [facility] 08/30/2019, 3:46 PM

## 2019-08-30 NOTE — Plan of Care (Signed)

## 2019-08-30 NOTE — Progress Notes (Signed)
Pt c/o of loose stool and irritation of hemorrhoids, MD made aware and order received. SRP, RN

## 2019-08-30 NOTE — Progress Notes (Signed)
HEMATOLOGY-ONCOLOGY PROGRESS NOTE  SUBJECTIVE: Veronica [name] reports feeling better overall this morning.  However, she reports that she feels more hoarse and like her face is swollen.  She is not complaining of any shortness of breath or cough.  TMax 99.6.  No longer having chills.  She had some nausea yesterday which has now resolved.  No further bleeding from her hemorrhoids noted.  Blood cultures remain negative to date.  Oncology History  Malignant neoplasm of upper-outer quadrant of right breast in female, estrogen receptor positive ([place])  05/07/2019 Initial Diagnosis   Malignant neoplasm of upper-outer quadrant of right breast in female, estrogen receptor positive ([place])   05/16/2019 Genetic Testing   No pathogenic variants detected on the [facility] Common Hereditary Cancers panel. A variant of uncertain significance (VUS) was detected in one of her MSH6 genes (c.831A>C).  The Common Hereditary Gene Panel offered by [facility] includes sequencing and/or deletion duplication testing of the following 48 genes: APC, ATM, AXIN2, BARD1, BMPR1A, BRCA1, BRCA2, BRIP1, CDH1, CDK4, CDKN2A (p14ARF), CDKN2A (p16INK4a), CHEK2, CTNNA1, DICER1, EPCAM (Deletion/duplication testing only), GREM1 (promoter region deletion/duplication testing only), KIT, MEN1, MLH1, MSH2, MSH3, MSH6, MUTYH, NBN, NF1, NHTL1, PALB2, PDGFRA, PMS2, POLD1, POLE, PTEN, RAD50, RAD51C, RAD51D, RNF43, SDHB, SDHC, SDHD, SMAD4, SMARCA4. STK11, TP53, TSC1, TSC2, and VHL.  The following genes were evaluated for sequence changes only: SDHA and HOXB13 c.251G>A variant only.    07/24/2019 -  Chemotherapy   The patient had DOXOrubicin (ADRIAMYCIN) chemo injection 126 mg, 60 mg/m2 = 126 mg, Intravenous,  Once, 3 of 4 cycles Dose modification: 48 mg/m2 (original dose 60 mg/m2, Cycle 4, Reason: Provider Judgment) Administration: 126 mg (07/24/2019), 126 mg (08/07/2019), 126 mg (08/21/2019) palonosetron (ALOXI) injection 0.25 mg, 0.25 mg, Intravenous,  Once, 3 of 4  cycles Administration: 0.25 mg (07/24/2019), 0.25 mg (08/07/2019), 0.25 mg (08/21/2019) pegfilgrastim (NEULASTA ONPRO KIT) injection 6 mg, 6 mg, Subcutaneous, Once, 3 of 4 cycles Administration: 6 mg (07/24/2019), 6 mg (08/07/2019), 6 mg (08/21/2019) cyclophosphamide (CYTOXAN) 1,260 mg in sodium chloride 0.9 % 250 mL chemo infusion, 600 mg/m2 = 1,260 mg, Intravenous,  Once, 3 of 4 cycles Dose modification: 480 mg/m2 (original dose 600 mg/m2, Cycle 4, Reason: Provider Judgment) Administration: 1,260 mg (07/24/2019), 1,260 mg (08/07/2019), 1,260 mg (08/21/2019) PACLitaxel (TAXOL) 168 mg in sodium chloride 0.9 % 250 mL chemo infusion (</= 48m/m2), 80 mg/m2 = 168 mg, Intravenous,  Once, 0 of 12 cycles fosaprepitant (EMEND) 150 mg, dexamethasone (DECADRON) 12 mg in sodium chloride 0.9 % 145 mL IVPB, , Intravenous,  Once, 3 of 4 cycles Administration:  (07/24/2019),  (08/07/2019),  (08/21/2019)  for chemotherapy treatment.       REVIEW OF SYSTEMS:   Constitutional: No fevers or chills. Respiratory: Has cough and shortness of breath Cardiovascular: Denies palpitation, chest discomfort Gastrointestinal:  Denies nausea, heartburn.  Has diarrhea which is consistent with her baseline. Skin: Denies abnormal skin rashes Lymphatics: Denies new lymphadenopathy or easy bruising Neurological:Denies numbness, tingling or new weaknesses Behavioral/Psych: Mood is stable, no new changes  Extremities: No lower extremity edema All other systems were reviewed with the patient and are negative.  I have reviewed the past medical history, past surgical history, social history and family history with the patient and they are unchanged from previous note.   PHYSICAL EXAMINATION: ECOG PERFORMANCE STATUS: 1 - Symptomatic but completely ambulatory  Vitals:   08/29/19 2039 08/30/19 0615  BP: 114/75 121/67  Pulse: (!) 106 94  Resp: 20 20  Temp: 99.5 F (37.5 C)  99.6 F (37.6 C)  SpO2: 100% 100%   Filed  Weights   08/29/19 0358  Weight: 222 lb 0.1 oz (100.7 kg)    Intake/Output from previous day: 11/18 0701 - 11/19 0700 In: 1328.1 [I.V.:528.1; IV Piggyback:800] Out: 0   GENERAL:alert, no distress and comfortable SKIN: skin color, texture, turgor are normal, no rashes or significant lesions EYES: normal, Conjunctiva are pink and non-injected, sclera clear OROPHARYNX:no exudate, no erythema and lips, buccal mucosa, and tongue normal  NECK: supple, thyroid normal size, non-tender, without nodularity LYMPH:  no palpable lymphadenopathy in the cervical, axillary or inguinal CHEST: Port-A-Cath site without redness or tenderness LUNGS: Diminished breath sounds right posterior base otherwise clear HEART: regular rate & rhythm and no murmurs and no lower extremity edema ABDOMEN:abdomen soft, non-tender and normal bowel sounds Musculoskeletal:no cyanosis of digits and no clubbing  NEURO: alert & oriented x 3 with fluent speech, no focal motor/sensory deficits  LABORATORY DATA:  I have reviewed the data as listed CMP Latest Ref Rng & Units 08/30/2019 08/29/2019 08/21/2019  Glucose 70 - 99 mg/dL 180(H) 197(H) 205(H)  BUN 6 - 20 mg/dL _0 Creatinine 0.44 - 1.00 mg/dL 0.52 0.81 0.71  Sodium 135 - 145 mmol/L 133(L) 133(L) 141  Potassium 3.5 - 5.1 mmol/L 3.3(L) 4.0 3.3(L)  Chloride 98 - 111 mmol/L 105 102 105  CO2 22 - 32 mmol/L 18(L) 22 24  Calcium 8.9 - 10.3 mg/dL 8.3(L) 8.9 8.8(L)  Total Protein 6.5 - 8.1 g/dL 5.8(L) 6.5 5.9(L)  Total Bilirubin 0.3 - 1.2 mg/dL 0.9 1.6(H) 0.6  Alkaline Phos 38 - 126 U/L 65 80 70  AST 15 - 41 U/L 15 18 14(L)  ALT 0 - 44 U/L 35 39 18    Lab Results  Component Value Date   WBC 1.5 (L) 08/30/2019   HGB 7.7 (L) 08/30/2019   HCT 23.5 (L) 08/30/2019   MCV 89.0 08/30/2019   PLT 87 (L) 08/30/2019   NEUTROABS 0.1 (L) 08/29/2019    Dg Chest Port 1 View  Result Date: 08/28/2019 CLINICAL DATA:  Cough and fever. Active chemotherapy for breast cancer.  EXAM: PORTABLE CHEST 1 VIEW COMPARISON:  Radiograph 06/12/2019 FINDINGS: Right chest port remains in place. Unchanged heart size and mediastinal contours. No focal airspace disease, pleural effusion, or pneumothorax. No acute osseous abnormalities are seen. IMPRESSION: No acute chest findings. Electronically Signed   By: Keith Rake M.D.   On: 08/28/2019 23:43    ASSESSMENT: 48 y.o. [place], [place] woman status post right breast upper outer quadrant biopsy 05/01/2019 for a clinical T1c N1, stage IIA invasive ductal carcinoma, grade 3, estrogen and progesterone receptor positive, HER-2 nonamplified, with an MIB-1-1 of 20%.             (a) mass in the axillary tail was a positive lymph node  (1) MammaPrint obtained from the original biopsy shows a high risk luminal subtype B tumor  (2) genetics testing 05/09/2019 through the Common Hereditary Gene Panel offered by [facility] found no deleterious mutations in APC, ATM, AXIN2, BARD1, BMPR1A, BRCA1, BRCA2, BRIP1, CDH1, CDK4, CDKN2A (p14ARF), CDKN2A (p16INK4a), CHEK2, CTNNA1, DICER1, EPCAM (Deletion/duplication testing only), GREM1 (promoter region deletion/duplication testing only), KIT, MEN1, MLH1, MSH2, MSH3, MSH6, MUTYH, NBN, NF1, NHTL1, PALB2, PDGFRA, PMS2, POLD1, POLE, PTEN, RAD50, RAD51C, RAD51D, RNF43, SDHB, SDHC, SDHD, SMAD4, SMARCA4. STK11, TP53, TSC1, TSC2, and VHL. The following genes were evaluated for sequence changes only: SDHA and HOXB13 c.251G>A variant only.             (  a) A variant of uncertain significance (VUS) was detected in one of her MSH6 genes (c.831A>C).  (3) status post right lumpectomy and sentinel lymph node sampling 06/12/2019 for a pT2 pN1, stage IIA invasive ductal carcinoma, grade 2, with positive margins             (a) a total of 4 sentinel lymph nodes removed, one positive (with ECE), ine itc             (b) margin clearance 04/19/2019 successful medial margin close but negative for DCIS  (4)adjuvant  chemotherapy will consist of doxorubicin and cyclophosphamide in dose dense fashion x4starting 09/29/2020followed by weekly paclitaxel x12             (a) echo 06/26/2019 shows an EF of 60-65%  (5)adjuvant radiationto follow  (6) antiestrogensto start at the completion of local treatment  (7) hospital admission 08/29/2019 -febrile neutropenia  PLAN:  Veronica [name] is feeling better overall.  She has developed some hoarseness and the sensation of facial swelling this morning.  She also has some diminished breath sounds in her right base.  Hospitalist has already ordered a chest x-ray.  Labs from today have been reviewed.  Her white blood cell count has increased to 1.5 this morning, her hemoglobin is up to 7.7, and platelet count is up to 87,000 this morning.  Anticipate that her white blood cell count will continue to improve rapidly since she received Neulasta last week with her chemotherapy.  I would not transfuse PRBCs unless her hemoglobin is less than 7.  Continue to follow blood cultures and consider de-escalating antibiotics if they remain negative.  We will plan to delay her next chemotherapy by 1 week and reduce the dose by 20%.   LOS: 1 day   Mikey Bussing, DNP, AGPCNP-BC, AOCNP 08/30/19

## 2019-08-31 DIAGNOSIS — R05 Cough: Secondary | ICD-10-CM

## 2019-08-31 DIAGNOSIS — R059 Cough, unspecified: Secondary | ICD-10-CM

## 2019-08-31 LAB — CBC
HCT: 22.6 % — ABNORMAL LOW (ref 36.0–46.0)
Hemoglobin: 7.4 g/dL — ABNORMAL LOW (ref 12.0–15.0)
MCH: 29.2 pg (ref 26.0–34.0)
MCHC: 32.7 g/dL (ref 30.0–36.0)
MCV: 89.3 fL (ref 80.0–100.0)
Platelets: 108 10*3/uL — ABNORMAL LOW (ref 150–400)
RBC: 2.53 MIL/uL — ABNORMAL LOW (ref 3.87–5.11)
RDW: 13.3 % (ref 11.5–15.5)
WBC: 5.3 10*3/uL (ref 4.0–10.5)
nRBC: 3.4 % — ABNORMAL HIGH (ref 0.0–0.2)

## 2019-08-31 LAB — COMPREHENSIVE METABOLIC PANEL
ALT: 31 U/L (ref 0–44)
AST: 15 U/L (ref 15–41)
Albumin: 2.9 g/dL — ABNORMAL LOW (ref 3.5–5.0)
Alkaline Phosphatase: 65 U/L (ref 38–126)
Anion gap: 9 (ref 5–15)
BUN: 5 mg/dL — ABNORMAL LOW (ref 6–20)
CO2: 21 mmol/L — ABNORMAL LOW (ref 22–32)
Calcium: 8.6 mg/dL — ABNORMAL LOW (ref 8.9–10.3)
Chloride: 108 mmol/L (ref 98–111)
Creatinine, Ser: 0.6 mg/dL (ref 0.44–1.00)
GFR calc Af Amer: >60 mL/min (ref >60)
GFR calc non Af Amer: >60 mL/min (ref >60)
Glucose, Bld: 156 mg/dL — ABNORMAL HIGH (ref 70–99)
Potassium: 3.7 mmol/L (ref 3.5–5.1)
Sodium: 138 mmol/L (ref 135–145)
Total Bilirubin: 0.7 mg/dL (ref 0.3–1.2)
Total Protein: 5.4 g/dL — ABNORMAL LOW (ref 6.5–8.1)

## 2019-08-31 MED ORDER — HEPARIN SOD (PORK) LOCK FLUSH 100 UNIT/ML IV SOLN
500.0000 [IU] | INTRAVENOUS | Status: AC | PRN
  Administered 2019-08-31: 500 [IU]
  Filled 2019-08-31: qty 5

## 2019-08-31 MED ORDER — HYDROCORTISONE ACETATE 25 MG RE SUPP: 25.0000 mg | Freq: Two times a day (BID) | RECTAL | 0 refills | Status: DC

## 2019-08-31 NOTE — Discharge Summary (Signed)
Physician Discharge Summary  Veronica [name] [id number] DOB: June 01, 1971 DOA: 08/28/2019  PCP: Blair Heys, PA-C  Admit date: 08/28/2019 Discharge date: 08/31/2019  Admitted From: Home Disposition: Home Recommendations for Outpatient Follow-up:  1. Follow up with PCP in 1-2 weeks 2. Please obtain BMP/CBC in one week 3. Please follow up with Dr. Jana Hakim  Home Health: None Equipment/Devices: None  Discharge Condition stable and improved CODE STATUS: Full code Diet recommendation: Cardiac Brief/Interim Summary:48 y.o.femalewith medical history significant ofbreast cancer on adjuvant chemotherapy status post right lumpectomy, chronic lower back pain with left-sided sciatica, hypertension presenting to the ED with a chief complaint of fevers and chills.Patient states last night she started having fevers and chills. She had a temperature above 101 F at home. Her heart was racing and her blood pressure was low. When she checked her blood pressure systolic was in the 0000000 and diastolic in the 123456. She has been coughing since yesterday but has not had any shortness of breath. No nausea, vomiting, or abdominal pain. She has been having diarrhea since she was started on chemotherapy for which she takes Imodium as needed. Denies dysuria, urinary frequency, or urgency. States she has been noticing small amounts of bright red blood with her stool.  ED Course:Temperature 102.6 F and tachycardic. Not hypotensive. WBC count 0.6 and evidence of neutropenia and lymphopenia. Lactic acid 2.3 >1.4with IV fluid resuscitation. Hemoglobin 8.9, was 10.1 about 8 days ago. Platelet count 92,000, previously low about a month ago but was normal on labs done 8 days ago. UA with negative nitrite, moderate amount of leukocytes, 11-20 WBCs, and few bacteria. Urine culture pending. FOBT positive. Noted to have nonthrombosed external hemorrhoids and palpable internal hemorrhoids on rectal exam done  in the ED. No gross rectal bleeding. Chest x-ray showing no acute chest findings. Blood culture x2 pending. SARS-CoV-2 test negative Patient received Tylenol, vancomycin, cefepime, and 3 L normal saline boluses Discharge Diagnoses:  Principal Problem:   Sepsis ([place]) Active Problems:   Neutropenia with fever ([place])   Hematochezia   Anemia   Thrombocytopenia ([facility])    #1 Neutropenic fever -patient admitted with fever tachycardia and neutropenia.  Status post recent chemo.  She also received Neulasta during the chemo session.  Urine culture and blood culture with no growth to date.  Chest x-ray at the time of admission to the hospital was negative.  Repeat chest x-ray was done after IV hydration which was also negative for any acute changes.  Patient did well overall.  And remained afebrile 24 a 48 hours prior to discharge.  She was Covid negative.  She was treated with Vanco and cefepime.  WBCs improved from 6-5.3 on the day of discharge. #2diarrhea after chemo-continue as needed Imodium.   #3 hematochezia history of hemorrhoids continue Anusol HC and lidocaine gel as needed.  Her hemoglobin was 7.4 on the day of discharge.    #4 thrombocytopenia stable status post chemo platelets 108 on the day of DC.  #5 breast CA status post lumpectomy on chemotherapy  #6 hyponatremia stable and resolved sodium 138 on the day of discharge.   Nutrition Problem: Increased nutrient needs Etiology: acute illness, cancer and cancer related treatments    Signs/Symptoms: estimated needs     Interventions: Ensure Enlive (each supplement provides 350kcal and 20 grams of protein)  Estimated body mass index is 41.95 kg/m as calculated from the following:   Height as of this encounter: 5\' 1"  (1.549 m).   Weight as of this encounter:  100.7 kg.  Discharge Instructions  Discharge Instructions    Call MD for:  difficulty breathing, headache or visual disturbances   Complete by: As directed    Call  MD for:  persistant nausea and vomiting   Complete by: As directed    Call MD for:  temperature >100.4   Complete by: As directed    Diet - low sodium heart healthy   Complete by: As directed    Increase activity slowly   Complete by: As directed      Allergies as of 08/31/2019   No Known Allergies     Medication List    STOP taking these medications   ciprofloxacin 500 MG tablet Commonly known as: CIPRO     TAKE these medications   acetaminophen 500 MG tablet Commonly known as: TYLENOL Take 500 mg by mouth every 6 (six) hours as needed for mild pain.   dexamethasone 4 MG tablet Commonly known as: DECADRON TAKE TWO TABLETS BY MOUTH ONCE A DAY ON THE DAY AFTER CHEMOTHERAPY AND THEN TAKE TWO TABLETS BY MOUTH TWICE DAILY FOR 2 DAYS   famotidine 10 MG tablet Commonly known as: PEPCID Take 10 mg by mouth 2 (two) times daily.   hydrocortisone 25 MG suppository Commonly known as: ANUSOL-HC Place 1 suppository (25 mg total) rectally 2 (two) times daily.   lidocaine-prilocaine cream Commonly known as: EMLA Apply to affected area once   loratadine 10 MG tablet Commonly known as: CLARITIN Take 10 mg by mouth See admin instructions. Takes for 5 days after chemo   LORazepam 0.5 MG tablet Commonly known as: Ativan Take 1 tablet (0.5 mg total) by mouth at bedtime as needed (Nausea or vomiting).   olmesartan-hydrochlorothiazide 20-12.5 MG tablet Commonly known as: BENICAR HCT Take 1 tablet by mouth daily.   ondansetron 8 MG tablet Commonly known as: ZOFRAN Take 1 tablet (8 mg total) by mouth every 8 (eight) hours as needed for nausea or vomiting.   prochlorperazine 10 MG tablet Commonly known as: COMPAZINE Take 1 tablet (10 mg total) by mouth every 6 (six) hours as needed (Nausea or vomiting).   Vitamin D3 25 MCG (1000 UT) Chew Chew 1 tablet by mouth daily.      Follow-up Information    Long, Caryl Pina, PA-C Follow up.   Specialty: Physician Assistant Contact  information: [address] [place] [address] [phone]        Magrinat, Virgie Dad, MD Follow up.   Specialty: Oncology Contact information: [place] [address] [phone]          No Known Allergies  Consultations:  Oncology   Procedures/Studies: Dg Chest 1 View  Result Date: 08/30/2019 CLINICAL DATA:  Cough.  History of breast cancer. EXAM: CHEST  1 VIEW COMPARISON:  08/28/2019 FINDINGS: Stable position of the right jugular Port-A-Cath with the tip in the lower SVC. Densities in the right lower chest appear to be related to overlying breast tissue. No focal airspace disease. No evidence for pulmonary edema. Heart and mediastinum are within normal limits. Trachea is midline. IMPRESSION: No acute cardiopulmonary disease. Electronically Signed   By: Markus Daft M.D.   On: 08/30/2019 09:42   Dg Chest Port 1 View  Result Date: 08/28/2019 CLINICAL DATA:  Cough and fever. Active chemotherapy for breast cancer. EXAM: PORTABLE CHEST 1 VIEW COMPARISON:  Radiograph 06/12/2019 FINDINGS: Right chest port remains in place. Unchanged heart size and mediastinal contours. No focal airspace disease, pleural effusion, or  pneumothorax. No acute osseous abnormalities are seen. IMPRESSION: No acute chest findings. Electronically Signed   By: Keith Rake M.D.   On: 08/28/2019 23:43    (Echo, Carotid, EGD, Colonoscopy, ERCP)    Subjective:  Patient resting in bed awake alert no new complaints today has been bringing in [place] has appetite better Discharge Exam: Vitals:   08/31/19 0502 08/31/19 1429  BP: (!) 132/55 118/65  Pulse: 93 (!) 105  Resp: 18 18  Temp: 99.4 F (37.4 C) 98.4 F (36.9 C)  SpO2: 100% 99%   Vitals:   08/30/19 1629 08/30/19 2012 08/31/19 0502 08/31/19 1429  BP: (!) 141/78 113/72 (!) 132/55 118/65  Pulse: (!) 105 (!) 104 93 (!) 105  Resp: 17  18 18   Temp: 98.9 F (37.2 C) 98.9 F (37.2 C) 99.4 F (37.4 C)  98.4 F (36.9 C)  TempSrc: Oral Oral Oral Oral  SpO2: 97%  100% 99%  Weight:      Height:        General: Pt is alert, awake, not in acute distress Cardiovascular: RRR, S1/S2 +, no rubs, no gallops Respiratory: CTA bilaterally, no wheezing, no rhonchi Abdominal: Soft, NT, ND, bowel sounds + Extremities: no edema, no cyanosis    The results of significant diagnostics from this hospitalization (including imaging, microbiology, ancillary and laboratory) are listed below for reference.     Microbiology: Recent Results (from the past 240 hour(s))  Urine culture     Status: Abnormal   Collection Time: 08/28/19 11:26 PM   Specimen: In/Out Cath Urine  Result Value Ref Range Status   Specimen Description   Final    IN/OUT CATH URINE Performed at [facility] [place] [address] [place], [place] [address]    Special Requests   Final    NONE Performed at [facility], [place] [address] [place], [place] [address]    Culture MULTIPLE SPECIES PRESENT, SUGGEST RECOLLECTION (A)  Final   Report Status 08/29/2019 FINAL  Final  Blood Culture (routine x 2)     Status: None (Preliminary result)   Collection Time: 08/28/19 11:31 PM   Specimen: BLOOD  Result Value Ref Range Status   Specimen Description   Final    BLOOD LEFT ANTECUBITAL Performed at [facility] [address], [place], [place] [address]    Special Requests   Final    BOTTLES DRAWN AEROBIC AND ANAEROBIC Blood Culture adequate volume Performed at [place] [address], [place], [place] [address]    Culture   Final    NO GROWTH 2 DAYS Performed at [facility] [address] [place], [place] [address]    Report Status PENDING  Incomplete  SARS CORONAVIRUS 2 (TAT 6-24 HRS) Nasopharyngeal Nasopharyngeal Swab     Status: None   Collection Time: 08/28/19 11:38 PM   Specimen: Nasopharyngeal Swab  Result Value Ref Range Status   SARS  Coronavirus 2 NEGATIVE NEGATIVE Final    Comment: (NOTE) SARS-CoV-2 target nucleic acids are NOT DETECTED. The SARS-CoV-2 RNA is generally detectable in upper and lower respiratory specimens during the acute phase of infection. Negative results do not preclude SARS-CoV-2 infection, do not rule out co-infections with other pathogens, and should not be used as the sole basis for treatment or other patient management decisions. Negative results must be combined with clinical observations, patient history, and epidemiological information. The expected result is Negative. Fact Sheet for Patients: SugarRoll.be Fact Sheet for  Healthcare Providers: https://www.woods-.com/ This test is not yet approved or cleared by the [place] and  has been authorized for detection and/or diagnosis of SARS-CoV-2 by FDA under an Emergency Use Authorization (EUA). This EUA will remain  in effect (meaning this test can be used) for the duration of the COVID-19 declaration under Section 56 4(b)(1) of the Act, 21 U.S.C. section 360bbb-3(b)(1), unless the authorization is terminated or revoked sooner. Performed at [facility], [place] [address], [place], [place] [address]   Blood Culture (routine x 2)     Status: None (Preliminary result)   Collection Time: 08/28/19 11:57 PM   Specimen: BLOOD  Result Value Ref Range Status   Specimen Description   Final    BLOOD RIGHT HAND Performed at [facility] [address] [place], [place] [address]    Special Requests   Final    BOTTLES DRAWN AEROBIC AND ANAEROBIC Blood Culture results may not be optimal due to an inadequate volume of blood received in culture bottles Performed at [facility] [address], [place], [place] [address]    Culture   Final    NO GROWTH 2 DAYS Performed at [facility] [address], [place], [place] [address]    Report  Status PENDING  Incomplete     Labs: BNP (last 3 results) No results for input(s): BNP in the last 8760 hours. Basic Metabolic Panel: Recent Labs  Lab 08/29/19 0000 08/30/19 0335 08/31/19 0423  NA 133* 133* 138  K 4.0 3.3* 3.7  CL 102 105 108  CO2 22 18* 21*  GLUCOSE 197* 180* 156*  BUN 15 8 5*  CREATININE 0.81 0.52 0.60  CALCIUM 8.9 8.3* 8.6*  MG  --  1.8  --    Liver Function Tests: Recent Labs  Lab 08/29/19 0000 08/30/19 0335 08/31/19 0423  AST 18 15 15   ALT 39 35 31  ALKPHOS 80 65 65  BILITOT 1.6* 0.9 0.7  PROT 6.5 5.8* 5.4*  ALBUMIN 3.7 3.2* 2.9*   No results for input(s): LIPASE, AMYLASE in the last 168 hours. No results for input(s): AMMONIA in the last 168 hours. CBC: Recent Labs  Lab 08/29/19 0000 08/29/19 0644 08/30/19 0335 08/31/19 0423  WBC 0.6* 0.6* 1.5* 5.3  NEUTROABS 0.2* 0.1*  --   --   HGB 8.9* 7.3* 7.7* 7.4*  HCT 26.8* 22.2* 23.5* 22.6*  MCV 87.6 89.5 89.0 89.3  PLT 92* 72* 87* 108*   Cardiac Enzymes: No results for input(s): CKTOTAL, CKMB, CKMBINDEX, TROPONINI in the last 168 hours. BNP: Invalid input(s): POCBNP CBG: Recent Labs  Lab 08/29/19 2032  GLUCAP 181*   D-Dimer No results for input(s): DDIMER in the last 72 hours. Hgb A1c No results for input(s): HGBA1C in the last 72 hours. Lipid Profile No results for input(s): CHOL, HDL, LDLCALC, TRIG, CHOLHDL, LDLDIRECT in the last 72 hours. Thyroid function studies No results for input(s): TSH, T4TOTAL, T3FREE, THYROIDAB in the last 72 hours.  Invalid input(s): FREET3 Anemia work up No results for input(s): VITAMINB12, FOLATE, FERRITIN, TIBC, IRON, RETICCTPCT in the last 72 hours. Urinalysis    Component Value Date/Time   COLORURINE YELLOW 08/28/2019 2326   APPEARANCEUR CLOUDY (A) 08/28/2019 2326   LABSPEC 1.013 08/28/2019 2326   PHURINE 5.0 08/28/2019 2326   GLUCOSEU NEGATIVE 08/28/2019 2326   HGBUR MODERATE (A) 08/28/2019 2326   BILIRUBINUR NEGATIVE 08/28/2019 [place] 08/28/2019 2326  PROTEINUR NEGATIVE 08/28/2019 2326   NITRITE NEGATIVE 08/28/2019 2326   LEUKOCYTESUR MODERATE (A) 08/28/2019 2326   Sepsis Labs Invalid input(s): PROCALCITONIN,  WBC,  LACTICIDVEN Microbiology Recent Results (from the past 240 hour(s))  Urine culture     Status: Abnormal   Collection Time: 08/28/19 11:26 PM   Specimen: In/Out Cath Urine  Result Value Ref Range Status   Specimen Description   Final    IN/OUT CATH URINE Performed at [facility], [place] [address] [place], [place] [address]    Special Requests   Final    NONE Performed at [facility], [place] [address] [place], [place] [address]    Culture MULTIPLE SPECIES PRESENT, SUGGEST RECOLLECTION (A)  Final   Report Status 08/29/2019 FINAL  Final  Blood Culture (routine x 2)     Status: None (Preliminary result)   Collection Time: 08/28/19 11:31 PM   Specimen: BLOOD  Result Value Ref Range Status   Specimen Description   Final    BLOOD LEFT ANTECUBITAL Performed at [facility] [address], [place], [place] [address]    Special Requests   Final    BOTTLES DRAWN AEROBIC AND ANAEROBIC Blood Culture adequate volume Performed at [place] [address] [place], [place] [address]    Culture   Final    NO GROWTH 2 DAYS Performed at [facility] [address] [place], [place] [address]    Report Status PENDING  Incomplete  SARS CORONAVIRUS 2 (TAT 6-24 HRS) Nasopharyngeal Nasopharyngeal Swab     Status: None   Collection Time: 08/28/19 11:38 PM   Specimen: Nasopharyngeal Swab  Result Value Ref Range Status   SARS Coronavirus 2 NEGATIVE NEGATIVE Final    Comment: (NOTE) SARS-CoV-2 target nucleic acids are NOT DETECTED. The SARS-CoV-2 RNA is generally detectable in upper and lower respiratory specimens during the acute phase of infection. Negative results do not preclude SARS-CoV-2 infection, do  not rule out co-infections with other pathogens, and should not be used as the sole basis for treatment or other patient management decisions. Negative results must be combined with clinical observations, patient history, and epidemiological information. The expected result is Negative. Fact Sheet for Patients: SugarRoll.be Fact Sheet for Healthcare Providers: https://www.woods-.com/ This test is not yet approved or cleared by the [place] FDA and  has been authorized for detection and/or diagnosis of SARS-CoV-2 by FDA under an Emergency Use Authorization (EUA). This EUA will remain  in effect (meaning this test can be used) for the duration of the COVID-19 declaration under Section 56 4(b)(1) of the Act, 21 U.S.C. section 360bbb-3(b)(1), unless the authorization is terminated or revoked sooner. Performed at [facility], [place] [address], [place], [place] [address]   Blood Culture (routine x 2)     Status: None (Preliminary result)   Collection Time: 08/28/19 11:57 PM   Specimen: BLOOD  Result Value Ref Range Status   Specimen Description   Final    BLOOD RIGHT HAND Performed at [facility] [address], [place], [place] [address]    Special Requests   Final    BOTTLES DRAWN AEROBIC AND ANAEROBIC Blood Culture results may not be optimal due to an inadequate volume of blood received in culture bottles Performed at [place] [facility] [address], [place], [place] [address]    Culture   Final    NO GROWTH 2 DAYS Performed at [facility], [place] [address], [place], [place] [address]    Report Status PENDING  Incomplete     Time coordinating discharge:  37 minutes  SIGNED:   Georgette Shell, MD  [facility] 08/31/2019, 3:11 PM Pager   If 7PM-7AM, please contact night-coverage [email or web address] Password [email or web address]

## 2019-09-01 ENCOUNTER — Encounter (HOSPITAL_COMMUNITY): Payer: Self-pay

## 2019-09-01 ENCOUNTER — Emergency Department (HOSPITAL_COMMUNITY)
Admission: EM | Admit: 2019-09-01 | Discharge: 2019-09-01 | Disposition: A | Discharge status: Home / Self Care | Payer: 59 | Attending: Emergency Medicine | Admitting: Emergency Medicine

## 2019-09-01 ENCOUNTER — Other Ambulatory Visit: Payer: Self-pay

## 2019-09-01 ENCOUNTER — Encounter: Payer: Self-pay | Admitting: Oncology

## 2019-09-01 ENCOUNTER — Emergency Department (HOSPITAL_COMMUNITY): Payer: 59

## 2019-09-01 DIAGNOSIS — R0602 Shortness of breath: Secondary | ICD-10-CM | POA: Insufficient documentation

## 2019-09-01 DIAGNOSIS — E8770 Fluid overload, unspecified: Secondary | ICD-10-CM | POA: Insufficient documentation

## 2019-09-01 DIAGNOSIS — E876 Hypokalemia: Secondary | ICD-10-CM | POA: Insufficient documentation

## 2019-09-01 DIAGNOSIS — I10 Essential (primary) hypertension: Secondary | ICD-10-CM | POA: Diagnosis not present

## 2019-09-01 DIAGNOSIS — Z79899 Other long term (current) drug therapy: Secondary | ICD-10-CM | POA: Diagnosis not present

## 2019-09-01 DIAGNOSIS — R220 Localized swelling, mass and lump, head: Secondary | ICD-10-CM | POA: Diagnosis present

## 2019-09-01 LAB — BRAIN NATRIURETIC PEPTIDE: B Natriuretic Peptide: 222 pg/mL — ABNORMAL HIGH (ref 0.0–100.0)

## 2019-09-01 LAB — BASIC METABOLIC PANEL
Anion gap: 12 (ref 5–15)
BUN: 5 mg/dL — ABNORMAL LOW (ref 6–20)
CO2: 23 mmol/L (ref 22–32)
Calcium: 9.4 mg/dL (ref 8.9–10.3)
Chloride: 102 mmol/L (ref 98–111)
Creatinine, Ser: 0.69 mg/dL (ref 0.44–1.00)
GFR calc Af Amer: >60 mL/min (ref >60)
GFR calc non Af Amer: >60 mL/min (ref >60)
Glucose, Bld: 173 mg/dL — ABNORMAL HIGH (ref 70–99)
Potassium: 2.8 mmol/L — ABNORMAL LOW (ref 3.5–5.1)
Sodium: 137 mmol/L (ref 135–145)

## 2019-09-01 LAB — CBC
HCT: 26.6 % — ABNORMAL LOW (ref 36.0–46.0)
Hemoglobin: 8.6 g/dL — ABNORMAL LOW (ref 12.0–15.0)
MCH: 29.8 pg (ref 26.0–34.0)
MCHC: 32.3 g/dL (ref 30.0–36.0)
MCV: 92 fL (ref 80.0–100.0)
Platelets: 145 10*3/uL — ABNORMAL LOW (ref 150–400)
RBC: 2.89 MIL/uL — ABNORMAL LOW (ref 3.87–5.11)
RDW: 15.7 % — ABNORMAL HIGH (ref 11.5–15.5)
WBC: 15.2 10*3/uL — ABNORMAL HIGH (ref 4.0–10.5)
nRBC: 3.9 % — ABNORMAL HIGH (ref 0.0–0.2)

## 2019-09-01 LAB — TROPONIN I (HIGH SENSITIVITY): Troponin I (High Sensitivity): 5 ng/L (ref <18)

## 2019-09-01 LAB — MAGNESIUM: Magnesium: 2 mg/dL (ref 1.7–2.4)

## 2019-09-01 LAB — I-STAT BETA HCG BLOOD, ED (MC, WL, AP ONLY): I-stat hCG, quantitative: 13.5 m[IU]/mL — ABNORMAL HIGH (ref <5)

## 2019-09-01 IMAGING — CR DG CHEST 2V
2 series · 2 of 2 positions shown · non-contrast
Comparison: [DATE]

CLINICAL DATA: Chest pain

EXAM:
CHEST - 2 VIEW

[w chest pa]
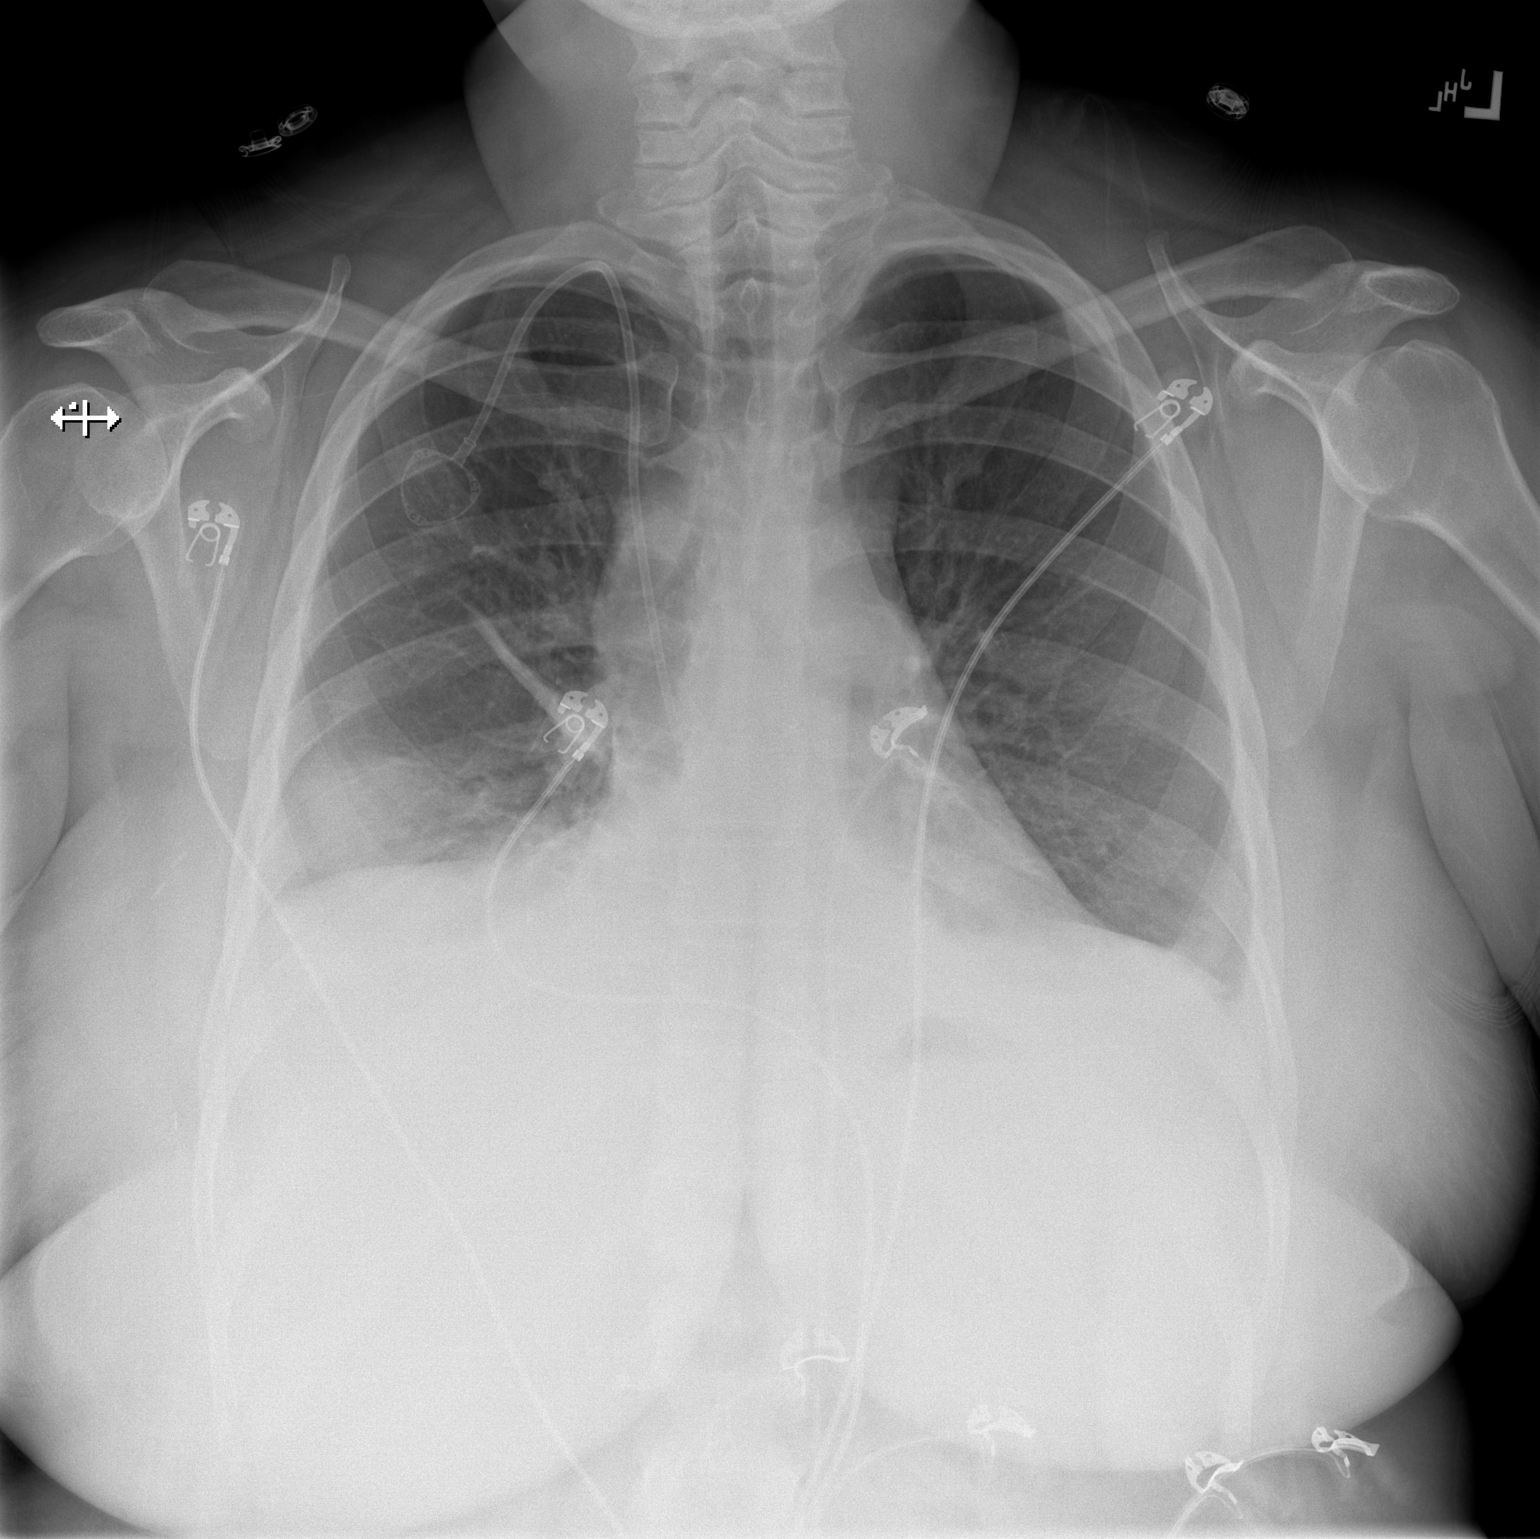

[w chest lat]
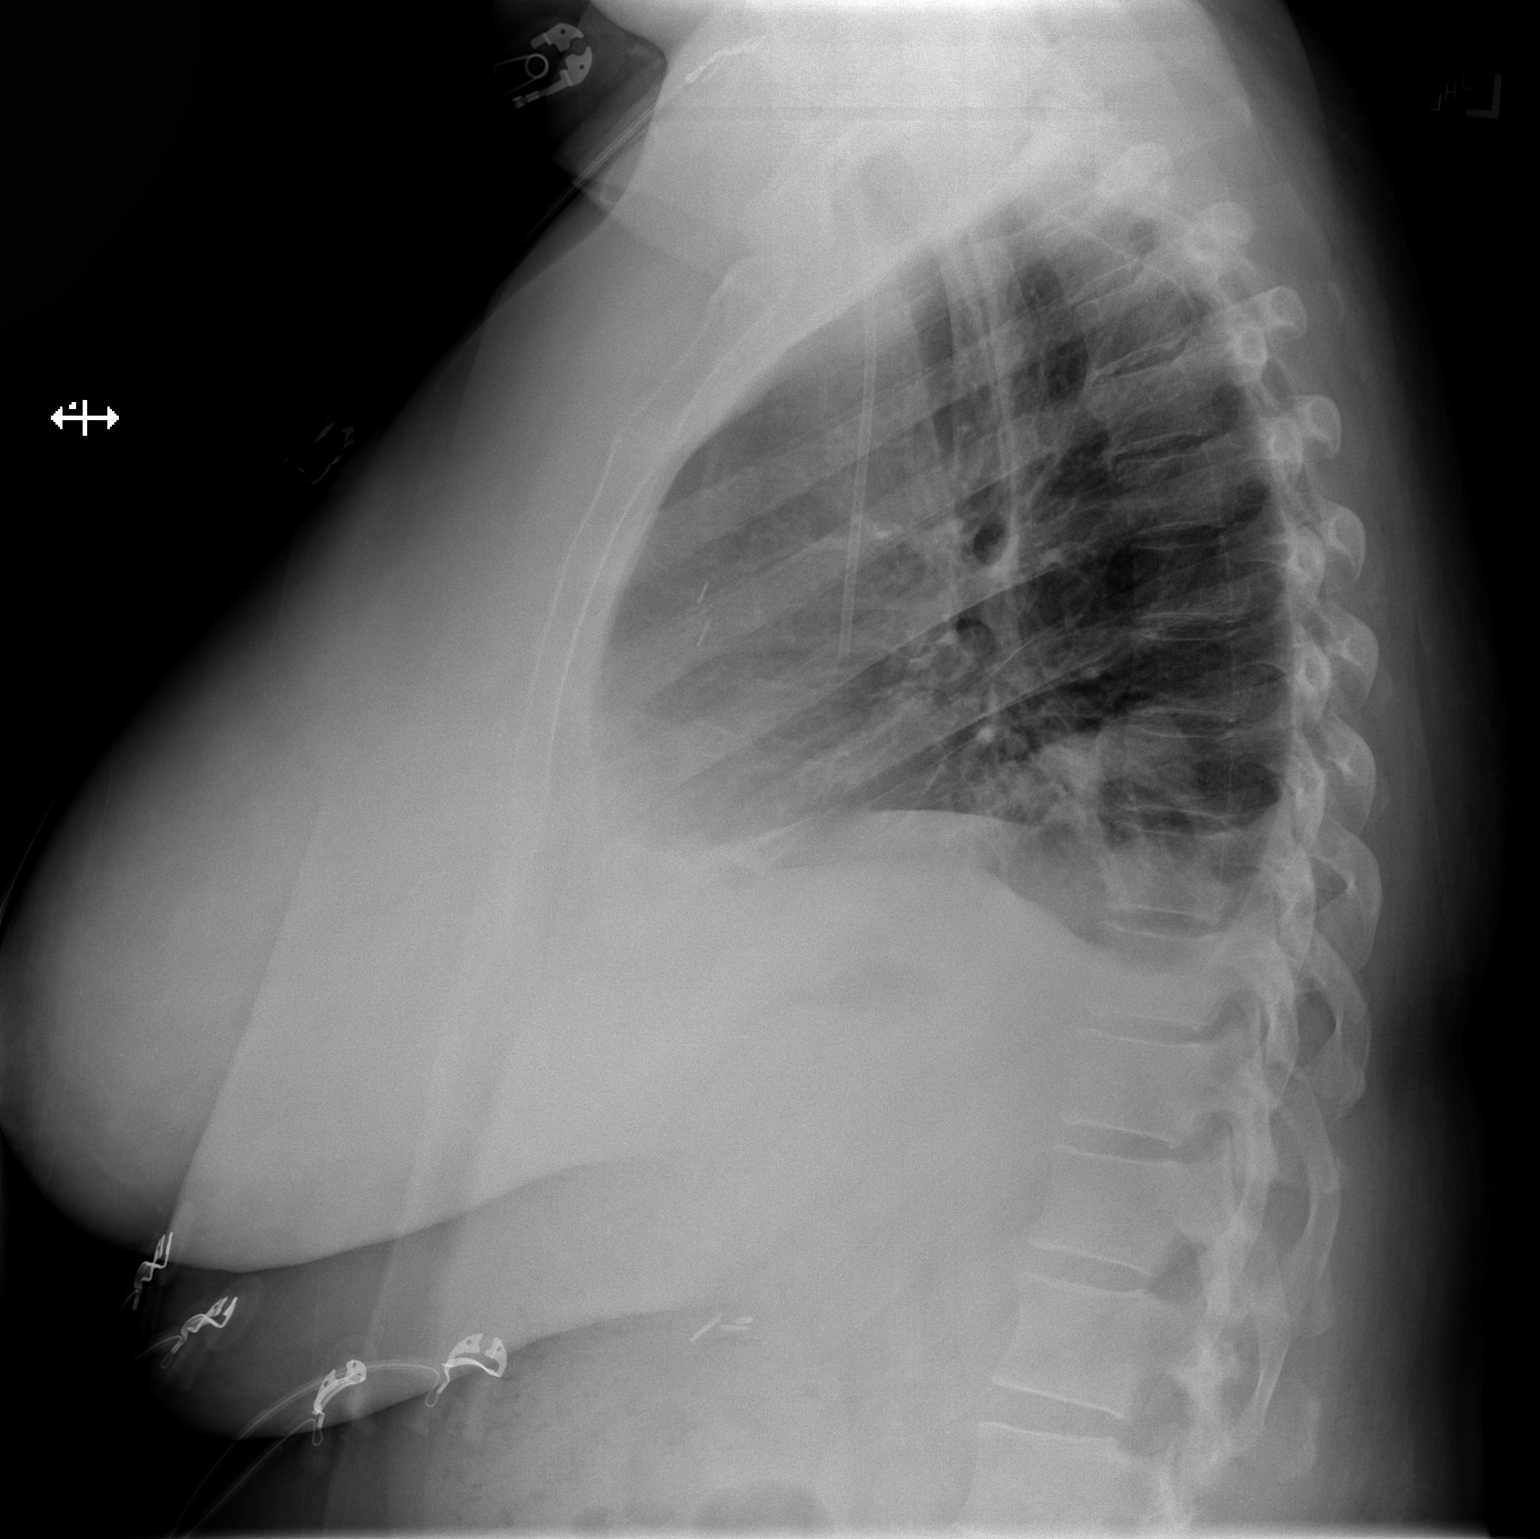

[2 of 2 positions shown; findings below may reference images not displayed]

FINDINGS: There is a well-positioned right-sided Port-A-Cath. There are small
bilateral pleural effusions, right greater than left. There is an
airspace opacity at the right lung base with new perihilar linear
airspace opacities on the right. There is no pneumothorax. No large
pleural effusion. No acute osseous abnormality.
IMPRESSION: 1. Small bilateral pleural effusions, increased in size from prior
study.
2. Developing opacity at the right lung base of unknown clinical
significance. This could represent atelectasis or an infiltrate. A
mass is not excluded. Follow-up to radiologic resolution is
recommended.
3. Bibasilar airspace opacities favored to represent atelectasis
with an infiltrate not excluded.

## 2019-09-01 MED ORDER — HEPARIN SOD (PORK) LOCK FLUSH 100 UNIT/ML IV SOLN
INTRAVENOUS | Status: AC
  Administered 2019-09-01: 500 [IU]
  Filled 2019-09-01: qty 5

## 2019-09-01 MED ORDER — SODIUM CHLORIDE 0.9% FLUSH
3.0000 mL | Freq: Once | INTRAVENOUS | Status: AC
  Administered 2019-09-01: 3 mL via INTRAVENOUS

## 2019-09-01 MED ORDER — POTASSIUM CHLORIDE CRYS ER 20 MEQ PO TBCR: 40.0000 meq | EXTENDED_RELEASE_TABLET | Freq: Two times a day (BID) | ORAL | 0 refills | Status: DC

## 2019-09-01 MED ORDER — POTASSIUM CHLORIDE 10 MEQ/100ML IV SOLN
10.0000 meq | Freq: Once | INTRAVENOUS | Status: AC
  Administered 2019-09-01: 10 meq via INTRAVENOUS
  Filled 2019-09-01: qty 100

## 2019-09-01 MED ORDER — FUROSEMIDE 20 MG PO TABS: 20.0000 mg | ORAL_TABLET | Freq: Every day | ORAL | 0 refills | Status: DC

## 2019-09-01 MED ORDER — POTASSIUM CHLORIDE CRYS ER 20 MEQ PO TBCR
40.0000 meq | EXTENDED_RELEASE_TABLET | Freq: Once | ORAL | Status: AC
  Administered 2019-09-01: 40 meq via ORAL
  Filled 2019-09-01: qty 2

## 2019-09-01 MED ORDER — FUROSEMIDE 10 MG/ML IJ SOLN
20.0000 mg | Freq: Once | INTRAMUSCULAR | Status: AC
  Administered 2019-09-01: 20 mg via INTRAVENOUS
  Filled 2019-09-01: qty 4

## 2019-09-01 NOTE — ED Triage Notes (Signed)
Pt presents with c/o chest tightness, facial swelling, and arm swelling. Pt reports she was discharged yesterday from the hospital and since then, her face has continuously getting more and more swollen. Pt is a cancer patient, last chemo was last Tuesday.

## 2019-09-01 NOTE — ED Provider Notes (Signed)
[facility] DEPT Provider Note   CSN: [id number] Arrival date & time: 09/01/19  1745     History   Chief Complaint Chief Complaint  Patient presents with  . Facial Swelling  . Chest Pain    HPI Veronica [name] is a 48 y.o. female.     HPI  48 year old female presents with facial swelling, right breast swelling, bilateral arm swelling and feeling like she is having some difficulty breathing.  Feels like she cannot get a full breath because of the breast swelling.  Seem to start when she was admitted but then seemed to be all of a sudden worse again this morning.  Has a morning cough that is chronic but nothing new or worse.  No fevers.  Felt like maybe her tongue and throat were swollen this morning and tried some Benadryl but it did not seem to help too much.  Tongue and throat are fine now.  She states that when she was here being treated for neutropenic fever she received a large amount of IV fluids.  States she has gained about 19 pounds over 2 days. No leg swelling.  Past Medical History:  Diagnosis Date  . Cancer ([place])   . Chronic low back pain with left-sided sciatica   . Family history of leukemia   . Family history of stomach cancer   . Hypertension     Patient Active Problem List   Diagnosis Date Noted  . Cough   . Sepsis ([place]) 08/29/2019  . Neutropenia with fever ([place]) 08/29/2019  . Hematochezia 08/29/2019  . Anemia 08/29/2019  . Thrombocytopenia ([place]) 08/29/2019  . Port-A-Cath in place 07/24/2019  . Genetic testing 05/17/2019  . Family history of leukemia   . Family history of stomach cancer   . Malignant neoplasm of upper-outer quadrant of right breast in female, estrogen receptor positive ([place]) 05/07/2019  . S/P laparoscopic assisted vaginal hysterectomy (LAVH) 05/17/2011    Class: Chronic  . FOOT PAIN, RIGHT 07/11/2010    Past Surgical History:  Procedure Laterality Date  . BREAST BIOPSY Right 05/02/2019   right clips  X2  . BREAST LUMPECTOMY WITH RADIOACTIVE SEED AND SENTINEL LYMPH NODE BIOPSY Right 06/12/2019   Procedure: RIGHT BREAST RADIOACTIVE SEED LUMPECTOMY  AND RIGHT AXILLARY SEED TARGETED LYMPH NODE AND AXILLARY SENTINEL LYMPH NODE MAPPING;  Surgeon: Erroll Luna, MD;  Location: [facility];  Service: General;  Laterality: Right;  PEC BLOCK  . C/S x2  '98 '03  . CESAREAN SECTION    . CHOLECYSTECTOMY    . LAPAROSCOPIC ASSISTED VAGINAL HYSTERECTOMY  05/17/2011   Procedure: LAPAROSCOPIC ASSISTED VAGINAL HYSTERECTOMY;  Surgeon: Sharene Butters;  Location: [facility];  Service: Gynecology;  Laterality: N/A;  Laparoscopic Assisted Vaginal Hysterectomy With Lysis Of Adhesions  . PORTACATH PLACEMENT Right 06/12/2019   Procedure: INSERTION PORT-A-CATH WITH ULTRASOUND;  Surgeon: Erroll Luna, MD;  Location: [facility];  Service: General;  Laterality: Right;  . RE-EXCISION OF BREAST LUMPECTOMY Right 07/17/2019   Procedure: RE-EXCISION OF RIGHT BREAST LUMPECTOMY;  Surgeon: Erroll Luna, MD;  Location: [facility];  Service: General;  Laterality: Right;  . TUBAL LIGATION       OB History   No obstetric history on file.      Home Medications    Prior to Admission medications   Medication Sig Start Date End Date Taking? Authorizing Provider  acetaminophen (TYLENOL) 500 MG tablet Take 500 mg by mouth every 6 (six) hours  as needed for mild pain.    Yes [provider]  Cholecalciferol (VITAMIN D3) 25 MCG (1000 UT) CHEW Chew 1 tablet by mouth daily.    Yes [provider]  dexamethasone (DECADRON) 4 MG tablet TAKE TWO TABLETS BY MOUTH ONCE A DAY ON THE DAY AFTER CHEMOTHERAPY AND THEN TAKE TWO TABLETS BY MOUTH TWICE DAILY FOR 2 DAYS 08/22/19  Yes Magrinat, Virgie Dad, MD  famotidine (PEPCID) 10 MG tablet Take 10 mg by mouth 2 (two) times daily.   Yes [provider]  hydrocortisone (ANUSOL-HC) 25 MG suppository Place 1 suppository (25 mg total)  rectally 2 (two) times daily. 08/31/19  Yes Georgette Shell, MD  lidocaine-prilocaine (EMLA) cream Apply to affected area once 06/21/19  Yes Magrinat, Virgie Dad, MD  loratadine (CLARITIN) 10 MG tablet Take 10 mg by mouth See admin instructions. Takes for 5 days after chemo   Yes [provider]  LORazepam (ATIVAN) 0.5 MG tablet Take 1 tablet (0.5 mg total) by mouth at bedtime as needed (Nausea or vomiting). 06/21/19  Yes Magrinat, Virgie Dad, MD  olmesartan-hydrochlorothiazide (BENICAR HCT) 20-12.5 MG tablet Take 1 tablet by mouth daily.   Yes [provider]  ondansetron (ZOFRAN) 8 MG tablet Take 1 tablet (8 mg total) by mouth every 8 (eight) hours as needed for nausea or vomiting. 08/21/19  Yes Magrinat, Virgie Dad, MD  prochlorperazine (COMPAZINE) 10 MG tablet Take 1 tablet (10 mg total) by mouth every 6 (six) hours as needed (Nausea or vomiting). 06/21/19  Yes Magrinat, Virgie Dad, MD  furosemide (LASIX) 20 MG tablet Take 1 tablet (20 mg total) by mouth daily for 3 days. 09/01/19 09/04/19  Sherwood Gambler, MD  potassium chloride SA (KLOR-CON) 20 MEQ tablet Take 2 tablets (40 mEq total) by mouth 2 (two) times daily for 3 days. 09/01/19 09/04/19  Sherwood Gambler, MD    Family History Family History  Problem Relation Age of Onset  . Arthritis Mother   . COPD Mother   . Hypertension Mother   . Hyperlipidemia Mother   . Leukemia Brother 45  . Stomach cancer Maternal Grandmother        late 70s    Social History Social History   Tobacco Use  . Smoking status: Never Smoker  . Smokeless tobacco: Never Used  Substance Use Topics  . Alcohol use: Yes    Alcohol/week: 2.0 standard drinks    Types: 2 Standard drinks or equivalent per week    Comment: "2-3 a week"  . Drug use: No     Allergies   Patient has no known allergies.   Review of Systems Review of Systems  Constitutional: Negative for fever.  HENT: Positive for facial swelling.   Respiratory: Positive for  cough (chronic, unchanged) and shortness of breath.   Cardiovascular: Negative for chest pain and leg swelling.  All other systems reviewed and are negative.    Physical Exam Updated Vital Signs BP 115/73   Pulse (!) 104   Temp 99.7 F (37.6 C) (Oral)   Resp (!) 25   SpO2 98%   Physical Exam Vitals signs and nursing note reviewed.  Constitutional:      General: She is not in acute distress.    Appearance: She is well-developed. She is obese. She is not ill-appearing.  HENT:     Head: Normocephalic and atraumatic.     Comments: Symmetric swelling noted to bilateral cheeks. No significant swelling periorbitally. No lip/tongue/oropharyngeal swelling    Right  Ear: External ear normal.     Left Ear: External ear normal.     Nose: Nose normal.  Eyes:     General:        Right eye: No discharge.        Left eye: No discharge.  Cardiovascular:     Rate and Rhythm: Normal rate and regular rhythm.     Heart sounds: Normal heart sounds.  Pulmonary:     Effort: Pulmonary effort is normal.     Breath sounds: Normal breath sounds.  Chest:     Comments: No obvious right breast swelling Abdominal:     Palpations: Abdomen is soft.     Tenderness: There is no abdominal tenderness.  Musculoskeletal:     Right lower leg: No edema.     Left lower leg: No edema.  Skin:    General: Skin is warm and dry.  Neurological:     Mental Status: She is alert.  Psychiatric:        Mood and Affect: Mood is not anxious.      ED Treatments / Results  Labs (all labs ordered are listed, but only abnormal results are displayed) Labs Reviewed  BASIC METABOLIC PANEL - Abnormal; Notable for the following components:      Result Value   Potassium 2.8 (*)    Glucose, Bld 173 (*)    BUN 5 (*)    All other components within normal limits  CBC - Abnormal; Notable for the following components:   WBC 15.2 (*)    RBC 2.89 (*)    Hemoglobin 8.6 (*)    HCT 26.6 (*)    RDW 15.7 (*)    Platelets 145  (*)    nRBC 3.9 (*)    All other components within normal limits  BRAIN NATRIURETIC PEPTIDE - Abnormal; Notable for the following components:   B Natriuretic Peptide 222.0 (*)    All other components within normal limits  I-STAT BETA HCG BLOOD, ED ([facility], [facility], [facility] ONLY) - Abnormal; Notable for the following components:   I-stat hCG, quantitative 13.5 (*)    All other components within normal limits  MAGNESIUM  TROPONIN I (HIGH SENSITIVITY)    EKG EKG Interpretation  Date/Time:  Saturday September 01 2019 18:08:54 EST Ventricular Rate:  129 PR Interval:    QRS Duration: 76 QT Interval:  326 QTC Calculation: 478 R Axis:   45 Text Interpretation: Sinus tachycardia Nonspecific T wave abnormality Confirmed by Lajean Saver [phone]) on 09/01/2019 7:24:51 PM   Radiology Dg Chest 2 View  Result Date: 09/01/2019 CLINICAL DATA:  Chest pain EXAM: CHEST - 2 VIEW COMPARISON:  August 30, 2019 FINDINGS: There is a well-positioned right-sided Port-A-Cath. There are small bilateral pleural effusions, right greater than left. There is an airspace opacity at the right lung base with new perihilar linear airspace opacities on the right. There is no pneumothorax. No large pleural effusion. No acute osseous abnormality. IMPRESSION: 1. Small bilateral pleural effusions, increased in size from prior study. 2. Developing opacity at the right lung base of unknown clinical significance. This could represent atelectasis or an infiltrate. A mass is not excluded. Follow-up to radiologic resolution is recommended. 3. Bibasilar airspace opacities favored to represent atelectasis with an infiltrate not excluded. Electronically Signed   By: Constance Holster M.D.   On: 09/01/2019 18:40    Procedures Procedures (including critical care time)  Medications Ordered in ED Medications  potassium chloride 10 mEq in 100 mL IVPB (  10 mEq Intravenous New Bag/Given 09/01/19 2143)  sodium chloride flush (NS) 0.9 % injection 3  mL (3 mLs Intravenous Given 09/01/19 2149)  potassium chloride SA (KLOR-CON) CR tablet 40 mEq (40 mEq Oral Given 09/01/19 2144)  furosemide (LASIX) injection 20 mg (20 mg Intravenous Given 09/01/19 2145)     Initial Impression / Assessment and Plan / ED Course  I have reviewed the triage vital signs and the nursing notes.  Pertinent labs & imaging results that were available during my care of the patient were reviewed by me and considered in my medical decision making (see chart for details).        Chart review shows that last admission the patient got at least 3.5 L IV fluid bolus plus fluids from antibiotics, and maybe from maintenance rate.  Her swelling appears to be likely due to too much fluid and there is no sign of itching or rash to suggest allergic reaction.  She is afebrile.  She states she is chronically tachycardic and often in the low 100s or 110s.  This seems to be unchanged now.  She reports a little bit of dyspnea but she feels comfortable and wants to go home.  She has a leukocytosis that is of unclear etiology but without any infectious signs or symptoms I think this can be watched with oncology.  The potassium will be repleted but I think a couple days of Lasix would be warranted given such an increase in her weight and fluid.  She appears stable for discharge home but discussed she needs close monitoring with oncology.  Return precautions discussed.  Final Clinical Impressions(s) / ED Diagnoses   Final diagnoses:  Localized edema due to fluid overload  Hypokalemia    ED Discharge Orders         Ordered    furosemide (LASIX) 20 MG tablet  Daily     09/01/19 2227    potassium chloride SA (KLOR-CON) 20 MEQ tablet  2 times daily     09/01/19 2227           Sherwood Gambler, MD 09/01/19 2231

## 2019-09-01 NOTE — Discharge Instructions (Signed)
Return to the emergency department if you develop new or worsening shortness of breath, worsening swelling, vomiting, or any other new/concerning symptoms.

## 2019-09-03 ENCOUNTER — Inpatient Hospital Stay (HOSPITAL_BASED_OUTPATIENT_CLINIC_OR_DEPARTMENT_OTHER): Payer: 59 | Admitting: Medical

## 2019-09-03 ENCOUNTER — Telehealth: Payer: Self-pay | Admitting: Medical

## 2019-09-03 ENCOUNTER — Telehealth: Payer: Self-pay | Admitting: Emergency Medicine

## 2019-09-03 ENCOUNTER — Other Ambulatory Visit: Payer: Self-pay

## 2019-09-03 VITALS — BP 89/56 | HR 118 | Temp 98.5°F | Resp 17 | Ht 61.0 in | Wt 226.3 lb

## 2019-09-03 DIAGNOSIS — Z170 Estrogen receptor positive status [ER+]: Secondary | ICD-10-CM

## 2019-09-03 DIAGNOSIS — Z5189 Encounter for other specified aftercare: Secondary | ICD-10-CM | POA: Diagnosis not present

## 2019-09-03 DIAGNOSIS — C50411 Malignant neoplasm of upper-outer quadrant of right female breast: Secondary | ICD-10-CM

## 2019-09-03 DIAGNOSIS — Z5111 Encounter for antineoplastic chemotherapy: Secondary | ICD-10-CM | POA: Diagnosis not present

## 2019-09-03 DIAGNOSIS — E8770 Fluid overload, unspecified: Secondary | ICD-10-CM | POA: Diagnosis not present

## 2019-09-03 DIAGNOSIS — C773 Secondary and unspecified malignant neoplasm of axilla and upper limb lymph nodes: Secondary | ICD-10-CM | POA: Diagnosis not present

## 2019-09-03 LAB — CULTURE, BLOOD (ROUTINE X 2)
Culture: NO GROWTH
Culture: NO GROWTH
Special Requests: ADEQUATE

## 2019-09-03 MED ORDER — POTASSIUM CHLORIDE CRYS ER 20 MEQ PO TBCR: 20.0000 meq | EXTENDED_RELEASE_TABLET | Freq: Two times a day (BID) | ORAL | 0 refills | Status: DC

## 2019-09-03 MED ORDER — FUROSEMIDE 20 MG PO TABS: 20.0000 mg | ORAL_TABLET | Freq: Every day | ORAL | 0 refills | Status: DC

## 2019-09-03 NOTE — Telephone Encounter (Signed)
Pt called reporting recent ED visit for edema & wt gain, states that it was advised that she f/u with MD Magrinat after being d/c on 09/01/2019.  MD Magrinat not available at this time, pt can be seen in [facility] instead at 10 am.  Pt verbalized understanding and agreement to appt.  High priority scheduling message sent.  PA Lucianne Lei & MD Magrinat made aware.  Negative Covid-19 test as of 08/28/2019.  Denies any possible exposures or new symptoms since test performed.

## 2019-09-03 NOTE — Patient Instructions (Signed)
COVID-19 Frequently Asked Questions °COVID-19 (coronavirus disease) is an infection that is caused by a large family of viruses. Some viruses cause illness in people and others cause illness in animals like camels, cats, and bats. In some cases, the viruses that cause illness in animals can spread to humans. °Where did the coronavirus come from? °In December 2019, [place] told the World Health Organization (WHO) of several cases of lung disease (human respiratory illness). These cases were linked to an open seafood and livestock market in the city of [place]. The link to the seafood and livestock market suggests that the virus may have spread from animals to humans. However, since that first outbreak in December, the virus has also been shown to spread from person to person. °What is the name of the disease and the virus? °Disease name °Early on, this disease was called novel coronavirus. This is because scientists determined that the disease was caused by a new (novel) respiratory virus. The World Health Organization (WHO) has now named the disease COVID-19, or coronavirus disease. °Virus name °The virus that causes the disease is called severe acute respiratory syndrome coronavirus 2 (SARS-CoV-2). °More information on disease and virus naming °World Health Organization (WHO): www.who.int/emergencies/diseases/novel-coronavirus-2019/technical-guidance/naming-the-coronavirus-disease-(covid-2019)-and-the-virus-that-causes-it °Who is at risk for complications from coronavirus disease? °Some people may be at higher risk for complications from coronavirus disease. This includes older adults and people who have chronic diseases, such as heart disease, diabetes, and lung disease. °If you are at higher risk for complications, take these extra precautions: °· Avoid close contact with people who are sick or have a fever or cough. Stay at least 3-6 ft (1-2 m) away from them, if possible. °· Wash your hands often with soap and  water for at least 20 seconds. °· Avoid touching your face, mouth, nose, or eyes. °· Keep supplies on hand at home, such as food, medicine, and cleaning supplies. °· Stay home as much as possible. °· Avoid social gatherings and travel. °How does coronavirus disease spread? °The virus that causes coronavirus disease spreads easily from person to person (is contagious). There are also cases of community-spread disease. This means the disease has spread to: °· People who have no known contact with other infected people. °· People who have not traveled to areas where there are known cases. °It appears to spread from one person to another through droplets from coughing or sneezing. °Can I get the virus from touching surfaces or objects? °There is still a lot that we do not know about the virus that causes coronavirus disease. Scientists are basing a lot of information on what they know about similar viruses, such as: °· Viruses cannot generally survive on surfaces for long. They need a human body (host) to survive. °· It is more likely that the virus is spread by close contact with people who are sick (direct contact), such as through: °? Shaking hands or hugging. °? Breathing in respiratory droplets that travel through the air. This can happen when an infected person coughs or sneezes on or near other people. °· It is less likely that the virus is spread when a person touches a surface or object that has the virus on it (indirect contact). The virus may be able to enter the body if the person touches a surface or object and then touches his or her face, eyes, nose, or mouth. °Can a person spread the virus without having symptoms of the disease? °It may be possible for the virus to spread before a person   has symptoms of the disease, but this is most likely not the main way the virus is spreading. It is more likely for the virus to spread by being in close contact with people who are sick and breathing in the respiratory  droplets of a sick person's cough or sneeze. °What are the symptoms of coronavirus disease? °Symptoms vary from person to person and can range from mild to severe. Symptoms may include: °· Fever. °· Cough. °· Tiredness, weakness, or fatigue. °· Fast breathing or feeling short of breath. °These symptoms can appear anywhere from 2 to 14 days after you have been exposed to the virus. If you develop symptoms, call your health care provider. People with severe symptoms may need hospital care. °If I am exposed to the virus, how long does it take before symptoms start? °Symptoms of coronavirus disease may appear anywhere from 2 to 14 days after a person has been exposed to the virus. If you develop symptoms, call your health care provider. °Should I be tested for this virus? °Your health care provider will decide whether to test you based on your symptoms, history of exposure, and your risk factors. °How does a health care provider test for this virus? °Health care providers will collect samples to send for testing. Samples may include: °· Taking a swab of fluid from the nose. °· Taking fluid from the lungs by having you cough up mucus (sputum) into a sterile cup. °· Taking a blood sample. °· Taking a stool or urine sample. °Is there a treatment or vaccine for this virus? °Currently, there is no vaccine to prevent coronavirus disease. Also, there are no medicines like antibiotics or antivirals to treat the virus. A person who becomes sick is given supportive care, which means rest and fluids. A person may also relieve his or her symptoms by using over-the-counter medicines that treat sneezing, coughing, and runny nose. These are the same medicines that a person takes for the common cold. °If you develop symptoms, call your health care provider. People with severe symptoms may need hospital care. °What can I do to protect myself and my family from this virus? ° °  ° °You can protect yourself and your family by taking the  same actions that you would take to prevent the spread of other viruses. Take the following actions: °· Wash your hands often with soap and water for at least 20 seconds. If soap and water are not available, use alcohol-based hand sanitizer. °· Avoid touching your face, mouth, nose, or eyes. °· Cough or sneeze into a tissue, sleeve, or elbow. Do not cough or sneeze into your hand or the air. °? If you cough or sneeze into a tissue, throw it away immediately and wash your hands. °· Disinfect objects and surfaces that you frequently touch every day. °· Avoid close contact with people who are sick or have a fever or cough. Stay at least 3-6 ft (1-2 m) away from them, if possible. °· Stay home if you are sick, except to get medical care. Call your health care provider before you get medical care. °· Make sure your vaccines are up to date. Ask your health care provider what vaccines you need. °What should I do if I need to travel? °Follow travel recommendations from your local health authority, the CDC, and WHO. °Travel information and advice °· Centers for Disease Control and Prevention (CDC): www.cdc.gov/coronavirus/2019-ncov/travelers/index.html °· World Health Organization (WHO): www.who.int/emergencies/diseases/novel-coronavirus-2019/travel-advice °Know the risks and take action to protect your health °·   You are at higher risk of getting coronavirus disease if you are traveling to areas with an outbreak or if you are exposed to travelers from areas with an outbreak. °· Wash your hands often and practice good hygiene to lower the risk of catching or spreading the virus. °What should I do if I am sick? °General instructions to stop the spread of infection °· Wash your hands often with soap and water for at least 20 seconds. If soap and water are not available, use alcohol-based hand sanitizer. °· Cough or sneeze into a tissue, sleeve, or elbow. Do not cough or sneeze into your hand or the air. °· If you cough or  sneeze into a tissue, throw it away immediately and wash your hands. °· Stay home unless you must get medical care. Call your health care provider or local health authority before you get medical care. °· Avoid public areas. Do not take public transportation, if possible. °· If you can, wear a mask if you must go out of the house or if you are in close contact with someone who is not sick. °Keep your home clean °· Disinfect objects and surfaces that are frequently touched every day. This may include: °? Counters and tables. °? Doorknobs and light switches. °? Sinks and faucets. °? Electronics such as phones, remote controls, keyboards, computers, and tablets. °· Wash dishes in hot, soapy water or use a dishwasher. Air-dry your dishes. °· Wash laundry in hot water. °Prevent infecting other household members °· Let healthy household members care for children and pets, if possible. If you have to care for children or pets, wash your hands often and wear a mask. °· Sleep in a different bedroom or bed, if possible. °· Do not share personal items, such as razors, toothbrushes, deodorant, combs, brushes, towels, and washcloths. °Where to find more information °Centers for Disease Control and Prevention (CDC) °· Information and news updates: www.cdc.gov/coronavirus/2019-ncov °World Health Organization (WHO) °· Information and news updates: www.who.int/emergencies/diseases/novel-coronavirus-2019 °· Coronavirus health topic: www.who.int/health-topics/coronavirus °· Questions and answers on COVID-19: www.who.int/news-room/q-a-detail/q-a-coronaviruses °· Global tracker: who.sprinklr.com °American Academy of Pediatrics (AAP) °· Information for families: www.healthychildren.org/English/health-issues/conditions/chest-lungs/Pages/2019-Novel-Coronavirus.aspx °The coronavirus situation is changing rapidly. Check your local health authority website or the CDC and WHO websites for updates and news. °When should I contact a health care  provider? °· Contact your health care provider if you have symptoms of an infection, such as fever or cough, and you: °? Have been near anyone who is known to have coronavirus disease. °? Have come into contact with a person who is suspected to have coronavirus disease. °? Have traveled outside of the country. °When should I get emergency medical care? °· Get help right away by calling your local emergency services (911 in the [place] if you have: °? Trouble breathing. °? Pain or pressure in your chest. °? Confusion. °? Blue-tinged lips and fingernails. °? Difficulty waking from sleep. °? Symptoms that get worse. °Let the emergency medical personnel know if you think you have coronavirus disease. °Summary °· A new respiratory virus is spreading from person to person and causing COVID-19 (coronavirus disease). °· The virus that causes COVID-19 appears to spread easily. It spreads from one person to another through droplets from coughing or sneezing. °· Older adults and those with chronic diseases are at higher risk of disease. If you are at higher risk for complications, take extra precautions. °· There is currently no vaccine to prevent coronavirus disease. There are no medicines, such as antibiotics or   antivirals, to treat the virus. °· You can protect yourself and your family by washing your hands often, avoiding touching your face, and covering your coughs and sneezes. °This information is not intended to replace advice given to you by your health care provider. Make sure you discuss any questions you have with your health care provider. °Document Released: 01/23/2019 Document Revised: 01/23/2019 Document Reviewed: 01/23/2019 °Elsevier Patient Education © 2020 [employer] ° °

## 2019-09-03 NOTE — Telephone Encounter (Signed)
Scheduled per los. Gave avs and calendar  

## 2019-09-03 NOTE — Progress Notes (Signed)
[facility] Progress Note   PICCOLA COURTIER IP:[id number] [birth year] 48 y.o.  Veronica [name] is managed by Dr. Lurline Del  Actively treated with chemotherapy/immunotherapy/hormonal therapy: yes  Current therapy: Adriamycin, Cytoxan with Onpro support.   Last treated: 08/21/2019 (cycle 3, day 1)  Next scheduled appointment with provider: 09/11/2019  Assessment: Plan:    Malignant neoplasm of upper-outer quadrant of right breast in female, estrogen receptor positive ([place])  Hypervolemia, unspecified hypervolemia type - Plan: [place] Only, BNP (Brain natriuretic peptide)   ER positive malignant neoplasm of the right breast: The patient is status post cycle 3, day 1 of Adriamycin and Cytoxan which was dosed on 08/21/2019.  Fluid overload: The patient will return on 09/05/2019 for labs including a BNP chemistry panel.  She was told to continue Lasix 20 mg once daily and potassium chloride 20 mEq twice daily.  Please see After Visit Summary for patient specific instructions.  Future Appointments  Date Time Provider [place]  09/05/2019 10:00 AM [facility] LAB 1 [facility] None  09/05/2019 10:30 AM Kire Ferg, Lucianne Lei E., PA-C [facility] None  09/11/2019 12:15 PM [facility] LAB 2 [facility] None  09/11/2019 12:30 PM [facility] [place] FLUSH [facility] None  09/11/2019  1:00 PM Magrinat, Virgie Dad, MD [facility] None  09/11/2019  1:30 PM [facility] INFUSION [facility] None  09/18/2019  8:15 AM [facility] LAB 5 [facility] None  09/18/2019  8:30 AM [facility] [place] FLUSH [facility] None  09/18/2019  9:00 AM Causey, Charlestine Massed, NP [facility] None  09/18/2019 10:00 AM [facility] INFUSION [facility] None  09/25/2019  8:15 AM [facility] LAB 3 [facility] None  09/25/2019  8:30 AM [facility] [place] FLUSH [facility] None  09/25/2019  9:00 AM Magrinat, Virgie Dad, MD [facility] None  09/25/2019  9:30 AM [facility] INFUSION [facility] None     Orders Placed This Encounter  Procedures  . Basic Metabolic [place] Only  . BNP (Brain natriuretic peptide)       Subjective:   Patient ID:  Veronica [name] is a 48 y.o. (DOB 12-24-1970) female.  Chief Complaint:  Chief Complaint  Patient presents with  . Follow-up    HPI Veronica [name]  Is a 48 y.o. female with a diagnosis of an estrogen receptor positive breast cancer. She is managed by Dr. Jana Hakim and is receiving adjuvant chemotherapy consisting of doxorubicin and cyclophosphamide in dose dense fashion x4, to be followed by weekly paclitaxel x12.  She most recently received day 1 cycle 3 doxorubicin and cyclophosphamide, with Onpro support.  She was hospitalized from 08/28/2019 to 08/31/2019 with neutropenic fever. She was admitted with fever, tachycardia and neutropenia.  Urine culture showed multiple species likely indicating contamination. Blood culture showed no growth.  Chest x-ray at the time of admission to the hospital was negative.  Repeat chest x-ray was done after IV hydration which was also negative for any acute changes.  She was Covid negative.  She was treated with Vanco and cefepime.  Her blood counts recovered prior to her discharge.  She presented back to the emergency room on 09/01/2019 with an approximate 60 pound weight gain.  She was noted to have periorbital edema with swelling of her neck and her chest and upper arms.  She was having to sleep on several pillows was having shortness of breath and dyspnea on exertion.  Her BNP returned at 222.0.  A beta hCG returned at 13.5.  Her potassium returned at 2.8.  She was given a  prescription for Lasix 20 mg p.o. x3 days and potassium chloride 20 mEq p.o. twice daily x3 days.  Medications: I have reviewed the patient's current medications.  Allergies: No Known Allergies  Past Medical History:  Diagnosis Date  . Cancer ([place])   . Chronic low back pain with left-sided sciatica   . Family history of leukemia    . Family history of stomach cancer   . Hypertension     Past Surgical History:  Procedure Laterality Date  . BREAST BIOPSY Right 05/02/2019   right clips X2  . BREAST LUMPECTOMY WITH RADIOACTIVE SEED AND SENTINEL LYMPH NODE BIOPSY Right 06/12/2019   Procedure: RIGHT BREAST RADIOACTIVE SEED LUMPECTOMY  AND RIGHT AXILLARY SEED TARGETED LYMPH NODE AND AXILLARY SENTINEL LYMPH NODE MAPPING;  Surgeon: Erroll Luna, MD;  Location: [facility];  Service: General;  Laterality: Right;  PEC BLOCK  . C/S x2  '98 '03  . CESAREAN SECTION    . CHOLECYSTECTOMY    . LAPAROSCOPIC ASSISTED VAGINAL HYSTERECTOMY  05/17/2011   Procedure: LAPAROSCOPIC ASSISTED VAGINAL HYSTERECTOMY;  Surgeon: Sharene Butters;  Location: [facility];  Service: Gynecology;  Laterality: N/A;  Laparoscopic Assisted Vaginal Hysterectomy With Lysis Of Adhesions  . PORTACATH PLACEMENT Right 06/12/2019   Procedure: INSERTION PORT-A-CATH WITH ULTRASOUND;  Surgeon: Erroll Luna, MD;  Location: [facility];  Service: General;  Laterality: Right;  . RE-EXCISION OF BREAST LUMPECTOMY Right 07/17/2019   Procedure: RE-EXCISION OF RIGHT BREAST LUMPECTOMY;  Surgeon: Erroll Luna, MD;  Location: [facility];  Service: General;  Laterality: Right;  . TUBAL LIGATION      Family History  Problem Relation Age of Onset  . Arthritis Mother   . COPD Mother   . Hypertension Mother   . Hyperlipidemia Mother   . Leukemia Brother 38  . Stomach cancer Maternal Grandmother        late 57s    Social History   Socioeconomic History  . Marital status: Married    Spouse name: Not on file  . Number of children: Not on file  . Years of education: Not on file  . Highest education level: Not on file  Occupational History  . Not on file  Social Needs  . Financial resource strain: Not on file  . Food insecurity    Worry: Not on file    Inability: Not on file  . Transportation needs    Medical: Not on file     Non-medical: Not on file  Tobacco Use  . Smoking status: Never Smoker  . Smokeless tobacco: Never Used  Substance and Sexual Activity  . Alcohol use: Yes    Alcohol/week: 2.0 standard drinks    Types: 2 Standard drinks or equivalent per week    Comment: "2-3 a week"  . Drug use: No  . Sexual activity: Yes    Birth control/protection: Surgical  Lifestyle  . Physical activity    Days per week: Not on file    Minutes per session: Not on file  . Stress: Not on file  Relationships  . Social Herbalist on phone: Not on file    Gets together: Not on file    Attends religious service: Not on file    Active member of club or organization: Not on file    Attends meetings of clubs or organizations: Not on file    Relationship status: Not on file  . Intimate partner violence    Fear of  current or ex partner: Not on file    Emotionally abused: Not on file    Physically abused: Not on file    Forced sexual activity: Not on file  Other Topics Concern  . Not on file  Social History Narrative  . Not on file    Past Medical History, Surgical history, Social history, and Family history were reviewed and updated as appropriate.   Please see review of systems for further details on the patient's review from today.   Review of Systems:  Review of Systems  Constitutional: Negative for chills, diaphoresis and fever.  HENT: Positive for facial swelling. Negative for trouble swallowing.   Respiratory: Positive for shortness of breath. Negative for cough, choking, chest tightness, wheezing and stridor.   Cardiovascular: Negative for chest pain and palpitations.  Musculoskeletal:       Swelling of the arms and chest.    Objective:   Physical Exam:  BP (!) 89/56 (BP Location: Left Arm, Patient Position: Sitting)   Pulse (!) 118   Temp 98.5 F (36.9 C) (Temporal)   Resp 17   Ht 5\' 1"  (1.549 m)   Wt 226 lb 4.8 oz (102.6 kg)   SpO2 100%   BMI 42.76 kg/m  ECOG: 1  Physical  Exam Constitutional:      General: She is not in acute distress.    Appearance: She is not diaphoretic.  HENT:     Head: Normocephalic and atraumatic.     Comments: Periorbital edema and neck edema. Eyes:     General: No scleral icterus.       Right eye: No discharge.        Left eye: No discharge.  Cardiovascular:     Rate and Rhythm: Normal rate and regular rhythm.     Heart sounds: Normal heart sounds. No murmur. No friction rub. No gallop.   Pulmonary:     Effort: Pulmonary effort is normal. No respiratory distress.     Breath sounds: Normal breath sounds. No wheezing or rales.  Musculoskeletal:     Comments: Bilateral upper extremity edema.  Skin:    General: Skin is warm and dry.     Findings: No erythema or rash.  Neurological:     Mental Status: She is alert.     Coordination: Coordination normal.     Gait: Gait normal.  Psychiatric:        Mood and Affect: Mood normal.        Behavior: Behavior normal.        Thought Content: Thought content normal.        Judgment: Judgment normal.     Lab Review:     Component Value Date/Time   NA 137 09/01/2019 1920   K 2.8 (L) 09/01/2019 1920   CL 102 09/01/2019 1920   CO2 23 09/01/2019 1920   GLUCOSE 173 (H) 09/01/2019 1920   BUN 5 (L) 09/01/2019 1920   CREATININE 0.69 09/01/2019 1920   CREATININE 0.83 05/09/2019 1225   CALCIUM 9.4 09/01/2019 1920   PROT 5.4 (L) 08/31/2019 0423   ALBUMIN 2.9 (L) 08/31/2019 0423   AST 15 08/31/2019 0423   AST 18 05/09/2019 1225   ALT 31 08/31/2019 0423   ALT 23 05/09/2019 1225   ALKPHOS 65 08/31/2019 0423   BILITOT 0.7 08/31/2019 0423   BILITOT 0.7 05/09/2019 1225   GFRNONAA >60 09/01/2019 1920   GFRNONAA >60 05/09/2019 1225   GFRAA >60 09/01/2019 1920   GFRAA >60 05/09/2019  1225       Component Value Date/Time   WBC 15.2 (H) 09/01/2019 1920   RBC 2.89 (L) 09/01/2019 1920   HGB 8.6 (L) 09/01/2019 1920   HGB 14.8 05/09/2019 1225   HCT 26.6 (L) 09/01/2019 1920   PLT 145  (L) 09/01/2019 1920   PLT 210 05/09/2019 1225   MCV 92.0 09/01/2019 1920   MCH 29.8 09/01/2019 1920   MCHC 32.3 09/01/2019 1920   RDW 15.7 (H) 09/01/2019 1920   LYMPHSABS 0.3 (L) 08/29/2019 0644   MONOABS 0.1 08/29/2019 0644   EOSABS 0.0 08/29/2019 0644   BASOSABS 0.0 08/29/2019 0644   -------------------------------  Imaging from last 24 hours (if applicable):  Radiology interpretation: Dg Chest 1 View  Result Date: 08/30/2019 CLINICAL DATA:  Cough.  History of breast cancer. EXAM: CHEST  1 VIEW COMPARISON:  08/28/2019 FINDINGS: Stable position of the right jugular Port-A-Cath with the tip in the lower SVC. Densities in the right lower chest appear to be related to overlying breast tissue. No focal airspace disease. No evidence for pulmonary edema. Heart and mediastinum are within normal limits. Trachea is midline. IMPRESSION: No acute cardiopulmonary disease. Electronically Signed   By: Markus Daft M.D.   On: 08/30/2019 09:42   Dg Chest 2 View  Result Date: 09/01/2019 CLINICAL DATA:  Chest pain EXAM: CHEST - 2 VIEW COMPARISON:  August 30, 2019 FINDINGS: There is a well-positioned right-sided Port-A-Cath. There are small bilateral pleural effusions, right greater than left. There is an airspace opacity at the right lung base with new perihilar linear airspace opacities on the right. There is no pneumothorax. No large pleural effusion. No acute osseous abnormality. IMPRESSION: 1. Small bilateral pleural effusions, increased in size from prior study. 2. Developing opacity at the right lung base of unknown clinical significance. This could represent atelectasis or an infiltrate. A mass is not excluded. Follow-up to radiologic resolution is recommended. 3. Bibasilar airspace opacities favored to represent atelectasis with an infiltrate not excluded. Electronically Signed   By: Constance Holster M.D.   On: 09/01/2019 18:40   Dg Chest Port 1 View  Result Date: 08/28/2019 CLINICAL DATA:   Cough and fever. Active chemotherapy for breast cancer. EXAM: PORTABLE CHEST 1 VIEW COMPARISON:  Radiograph 06/12/2019 FINDINGS: Right chest port remains in place. Unchanged heart size and mediastinal contours. No focal airspace disease, pleural effusion, or pneumothorax. No acute osseous abnormalities are seen. IMPRESSION: No acute chest findings. Electronically Signed   By: Keith Rake M.D.   On: 08/28/2019 23:43

## 2019-09-03 NOTE — Progress Notes (Signed)
Pt presents for f/u visit from 09/01/2019 ED visit. +2 pitting edema bilat arms/legs/chest/trunk.  Swelling around neck & eyes, denies tightness in throat or trouble swallowing.  No rash present.  Afebrile.  A&Ox4.  Severe weight gain over last 1-2 weeks.  Reports DOE & SOB, denies CP.  Reports sleeping on several pillows at night and feeling bloating in chest/abdomen.  Pt on lasix and potassium from ED physician.  PA Lucianne Lei aware of hypotension and tachycardia, however d/t pt's fluid overload pt will not receive any IVF at this time.  Pt aware to return on 09/05/2019 for f/u and to call back beforehand with any questions/concerns/worsening symptoms.

## 2019-09-04 ENCOUNTER — Ambulatory Visit: Payer: 59

## 2019-09-04 ENCOUNTER — Other Ambulatory Visit: Payer: 59

## 2019-09-04 ENCOUNTER — Ambulatory Visit: Payer: 59 | Admitting: Adult Health

## 2019-09-05 ENCOUNTER — Other Ambulatory Visit: Payer: Self-pay

## 2019-09-05 ENCOUNTER — Inpatient Hospital Stay (HOSPITAL_BASED_OUTPATIENT_CLINIC_OR_DEPARTMENT_OTHER): Payer: 59 | Admitting: Medical

## 2019-09-05 ENCOUNTER — Inpatient Hospital Stay: Payer: 59

## 2019-09-05 VITALS — BP 100/50 | HR 121 | Temp 98.9°F | Resp 17 | Ht 61.0 in | Wt 221.3 lb

## 2019-09-05 DIAGNOSIS — R600 Localized edema: Secondary | ICD-10-CM

## 2019-09-05 DIAGNOSIS — Z95828 Presence of other vascular implants and grafts: Secondary | ICD-10-CM

## 2019-09-05 DIAGNOSIS — Z5111 Encounter for antineoplastic chemotherapy: Secondary | ICD-10-CM | POA: Diagnosis not present

## 2019-09-05 DIAGNOSIS — Z170 Estrogen receptor positive status [ER+]: Secondary | ICD-10-CM

## 2019-09-05 DIAGNOSIS — E8770 Fluid overload, unspecified: Secondary | ICD-10-CM

## 2019-09-05 DIAGNOSIS — C50411 Malignant neoplasm of upper-outer quadrant of right female breast: Secondary | ICD-10-CM

## 2019-09-05 LAB — BASIC METABOLIC PANEL - CANCER CENTER ONLY
Anion gap: 13 (ref 5–15)
BUN: 6 mg/dL (ref 6–20)
CO2: 24 mmol/L (ref 22–32)
Calcium: 9.1 mg/dL (ref 8.9–10.3)
Chloride: 102 mmol/L (ref 98–111)
Creatinine: 0.71 mg/dL (ref 0.44–1.00)
GFR, Est AFR Am: >60 mL/min (ref >60)
GFR, Estimated: >60 mL/min (ref >60)
Glucose, Bld: 199 mg/dL — ABNORMAL HIGH (ref 70–99)
Potassium: 3.7 mmol/L (ref 3.5–5.1)
Sodium: 139 mmol/L (ref 135–145)

## 2019-09-05 LAB — BRAIN NATRIURETIC PEPTIDE: B Natriuretic Peptide: 60.3 pg/mL (ref 0.0–100.0)

## 2019-09-05 MED ORDER — HEPARIN SOD (PORK) LOCK FLUSH 100 UNIT/ML IV SOLN
500.0000 [IU] | Freq: Once | INTRAVENOUS | Status: AC | PRN
  Administered 2019-09-05: 500 [IU]
  Filled 2019-09-05: qty 5

## 2019-09-05 MED ORDER — SODIUM CHLORIDE 0.9% FLUSH
10.0000 mL | INTRAVENOUS | Status: DC | PRN
  Administered 2019-09-05: 10 mL
  Filled 2019-09-05: qty 10

## 2019-09-05 NOTE — Progress Notes (Signed)
[facility] Progress Note   Veronica [name] IP:[id number] 08/02/1971 48 y.o.  Veronica [name] is managed by Dr. Lurline [name]  Actively treated with chemotherapy/immunotherapy/hormonal therapy: yes  Current therapy: Adriamycin, Cytoxan with Onpro support.   Last treated: 08/21/2019 (cycle 3, day 1)  Next scheduled appointment with provider: 09/11/2019  Assessment: Plan:    Port-A-Cath in place - Plan: heparin lock flush 100 unit/mL, sodium chloride flush (NS) 0.9 % injection 10 mL  Malignant neoplasm of upper-outer quadrant of right breast in female, estrogen receptor positive (HCC)  Edema of upper extremity   ER positive malignant neoplasm of the right breast: The patient is status post cycle 3, day 1 of Adriamycin and Cytoxan which was dosed on 08/21/2019. She was originally scheduled for chemotherapy this week. It has been delayed until next week.  Fluid overload: Veronica [name] is seen today for labs including a BNP chemistry panel.  She has been continued on Lasix 20 mg once daily and potassium chloride 20 mEq twice daily since her visit of 09/03/2019. Her weight today is 221.3. This is down from 09/03/2019 when it was 226 pounds. Her potassium returned today at 3.7. She was told to continue taking Lasix 20 mg once daily and potassium chloride 20 mEq twice daily through 09/09/2019. She will also continue to monitor her weight daily.  Please see After Visit Summary for patient specific instructions.  Future Appointments  Date Time Provider [place]  09/11/2019 12:15 PM [facility] LAB 3 [facility] None  09/11/2019 12:30 PM [facility] [place] FLUSH [facility] None  09/11/2019  1:00 PM Veronica [name] [facility] None  09/11/2019  1:30 PM [facility] INFUSION [facility] None  09/18/2019  8:15 AM [facility] LAB 5 [facility] None  09/18/2019  8:30 AM [facility] [place] FLUSH [facility] None  09/18/2019  9:00 AM Veronica [name] [facility] None    09/18/2019 10:00 AM [facility] INFUSION [facility] None  09/25/2019  8:15 AM [facility] LAB 3 [facility] None  09/25/2019  8:30 AM [facility] [place] FLUSH [facility] None  09/25/2019  9:00 AM Veronica [name] [facility] None  09/25/2019  9:30 AM [facility] INFUSION [facility] None    No orders of the defined types were placed in this encounter.      Subjective:   Patient ID:  Veronica [name] is a 48 y.o. (DOB [birth year]) female.  Chief Complaint:  Chief Complaint  Patient presents with   Follow-up    HPI Veronica [name]  Is a 48 y.o. female with a diagnosis of an estrogen receptor positive breast cancer. She is managed by Dr. Jana [name] and is receiving adjuvant chemotherapy consisting of doxorubicin and cyclophosphamide in dose dense fashion x4, to be followed by weekly paclitaxel x12.  She most recently received day 1 cycle 3 doxorubicin and cyclophosphamide, with Onpro support.  She was hospitalized from 08/28/2019 to 08/31/2019 with neutropenic fever. She was admitted with fever, tachycardia and neutropenia and was treated with Vanco and cefepime.  She was seen in the emergency room on 09/01/2019 with an approximate 16 pound weight gain.  She was noted to have periorbital edema with swelling of her neck and her chest and upper arms.  At that time, she was having to sleep on several pillows due to shortness of breath and dyspnea on exertion.  A BNP returned at 222.0, beta hCG returned at 13.5, and a potassium returned at 2.8.  She was given a prescription for Lasix 20 mg p.o. x3 days and potassium chloride  20 mEq p.o. twice daily x3 days. Ms. Toia is seen today for labs including a BNP chemistry panel.  She has been continued on Lasix 20 mg once daily and potassium chloride 20 mEq twice daily since her visit of 09/03/2019. Her weight today is 221.3. This is down from 09/03/2019 when it was 226 pounds. She was originally scheduled for chemotherapy this week. It has been delayed  until next week. She reports that she is feeling well overall and id breathing better.  Medications: I have reviewed the patient's current medications.  Allergies: No Known Allergies  Past Medical History:  Diagnosis Date   Cancer ([place])    Chronic low back pain with left-sided sciatica    Family history of leukemia    Family history of stomach cancer    Hypertension     Past Surgical History:  Procedure Laterality Date   BREAST BIOPSY Right 05/02/2019   right clips X2   BREAST LUMPECTOMY WITH RADIOACTIVE SEED AND SENTINEL LYMPH NODE BIOPSY Right 06/12/2019   Procedure: RIGHT BREAST RADIOACTIVE SEED LUMPECTOMY  AND RIGHT AXILLARY SEED TARGETED LYMPH NODE AND AXILLARY SENTINEL LYMPH NODE MAPPING;  Surgeon: Erroll Luna, [name];  Location: [facility];  Service: General;  Laterality: Right;  PEC BLOCK   C/S x2  '98 '[place]  05/17/2011   Procedure: LAPAROSCOPIC ASSISTED VAGINAL HYSTERECTOMY;  Surgeon: Sharene Butters;  Location: [facility];  Service: Gynecology;  Laterality: N/A;  Laparoscopic Assisted Vaginal Hysterectomy With Lysis Of Adhesions   PORTACATH PLACEMENT Right 06/12/2019   Procedure: INSERTION PORT-A-CATH WITH ULTRASOUND;  Surgeon: Erroll Luna, [name];  Location: [facility];  Service: General;  Laterality: Right;   RE-EXCISION OF BREAST LUMPECTOMY Right 07/17/2019   Procedure: RE-EXCISION OF RIGHT BREAST LUMPECTOMY;  Surgeon: Erroll Luna, [name];  Location: [facility];  Service: General;  Laterality: Right;   TUBAL LIGATION      Family History  Problem Relation Age of Onset   Arthritis Mother    COPD Mother    Hypertension Mother    Hyperlipidemia Mother    Leukemia Brother 38   Stomach cancer Maternal Grandmother        late 74s    Social History   Socioeconomic History   Marital status: Married    Spouse name: Not on file    Number of children: Not on file   Years of education: Not on file   Highest education level: Not on file  Occupational History   Not on file  Social Needs   Financial resource strain: Not on file   Food insecurity    Worry: Not on file    Inability: Not on file   Transportation needs    Medical: Not on file    Non-medical: Not on file  Tobacco Use   Smoking status: Never Smoker   Smokeless tobacco: Never Used  Substance and Sexual Activity   Alcohol use: Yes    Alcohol/week: 2.0 standard drinks    Types: 2 Standard drinks or equivalent per week    Comment: "2-3 a week"   Drug use: No   Sexual activity: Yes    Birth control/protection: Surgical  Lifestyle   Physical activity    Days per week: Not on file    Minutes per session: Not on file   Stress: Not on file  Relationships   Social connections  Talks on phone: Not on file    Gets together: Not on file    Attends religious service: Not on file    Active member of club or organization: Not on file    Attends meetings of clubs or organizations: Not on file    Relationship status: Not on file   Intimate partner violence    Fear of current or ex partner: Not on file    Emotionally abused: Not on file    Physically abused: Not on file    Forced sexual activity: Not on file  Other Topics Concern   Not on file  Social History Narrative   Not on file    Past Medical History, Surgical history, Social history, and Family history were reviewed and updated as appropriate.   Please see review of systems for further details on the patient's review from today.   Review of Systems:  Review of Systems  Constitutional: Negative for chills, diaphoresis and fever.  HENT: Positive for facial swelling. Negative for trouble swallowing.   Respiratory: Negative for cough, choking, chest tightness, shortness of breath, wheezing and stridor.   Cardiovascular: Negative for chest pain and palpitations.   Musculoskeletal:       Improving bilateral upper extremity edema.    Objective:   Physical Exam:  BP (!) 100/50 (BP Location: Left Arm, Patient Position: Sitting)    Pulse (!) 121    Temp 98.9 F (37.2 C) (Temporal)    Resp 17    Ht 5\' 1"  (1.549 m)    Wt 221 lb 4.8 oz (100.4 kg)    SpO2 99%    BMI 41.81 kg/m  ECOG: 1  Physical Exam Constitutional:      General: She is not in acute distress.    Appearance: She is not diaphoretic.  HENT:     Head: Normocephalic and atraumatic.     Comments: Improving periorbital edema and neck edema. Eyes:     General: No scleral icterus.       Right eye: No discharge.        Left eye: No discharge.  Cardiovascular:     Rate and Rhythm: Normal rate and regular rhythm.     Heart sounds: Normal heart sounds. No murmur. No friction rub. No gallop.   Pulmonary:     Effort: Pulmonary effort is normal. No respiratory distress.     Breath sounds: Normal breath sounds. No wheezing or rales.  Musculoskeletal:     Comments: Improving bilateral upper extremity edema.  Skin:    General: Skin is warm and dry.     Findings: No erythema or rash.  Neurological:     Mental Status: She is alert.     Coordination: Coordination normal.     Gait: Gait normal.  Psychiatric:        Mood and Affect: Mood normal.        Behavior: Behavior normal.        Thought Content: Thought content normal.        Judgment: Judgment normal.     Lab Review:     Component Value Date/Time   NA 139 09/05/2019 1030   K 3.7 09/05/2019 1030   CL 102 09/05/2019 1030   CO2 24 09/05/2019 1030   GLUCOSE 199 (H) 09/05/2019 1030   BUN 6 09/05/2019 1030   CREATININE 0.71 09/05/2019 1030   CALCIUM 9.1 09/05/2019 1030   PROT 5.4 (L) 08/31/2019 0423   ALBUMIN 2.9 (L) 08/31/2019 0423  AST 15 08/31/2019 0423   AST 18 05/09/2019 1225   ALT 31 08/31/2019 0423   ALT 23 05/09/2019 1225   ALKPHOS 65 08/31/2019 0423   BILITOT 0.7 08/31/2019 0423   BILITOT 0.7 05/09/2019 1225    GFRNONAA >60 09/05/2019 1030   GFRAA >60 09/05/2019 1030       Component Value Date/Time   WBC 15.2 (H) 09/01/2019 1920   RBC 2.89 (L) 09/01/2019 1920   HGB 8.6 (L) 09/01/2019 1920   HGB 14.8 05/09/2019 1225   HCT 26.6 (L) 09/01/2019 1920   PLT 145 (L) 09/01/2019 1920   PLT 210 05/09/2019 1225   MCV 92.0 09/01/2019 1920   MCH 29.8 09/01/2019 1920   MCHC 32.3 09/01/2019 1920   RDW 15.7 (H) 09/01/2019 1920   LYMPHSABS 0.3 (L) 08/29/2019 0644   MONOABS 0.1 08/29/2019 0644   EOSABS 0.0 08/29/2019 0644   BASOSABS 0.0 08/29/2019 0644   -------------------------------  Imaging from last 24 hours (if applicable):  Radiology interpretation: Dg Chest 1 View  Result Date: 08/30/2019 CLINICAL DATA:  Cough.  History of breast cancer. EXAM: CHEST  1 VIEW COMPARISON:  08/28/2019 FINDINGS: Stable position of the right jugular Port-A-Cath with the tip in the lower SVC. Densities in the right lower chest appear to be related to overlying breast tissue. No focal airspace disease. No evidence for pulmonary edema. Heart and mediastinum are within normal limits. Trachea is midline. IMPRESSION: No acute cardiopulmonary disease. Electronically Signed   By: Markus Daft M.D.   On: 08/30/2019 09:42   Dg Chest 2 View  Result Date: 09/01/2019 CLINICAL DATA:  Chest pain EXAM: CHEST - 2 VIEW COMPARISON:  August 30, 2019 FINDINGS: There is a well-positioned right-sided Port-A-Cath. There are small bilateral pleural effusions, right greater than left. There is an airspace opacity at the right lung base with new perihilar linear airspace opacities on the right. There is no pneumothorax. No large pleural effusion. No acute osseous abnormality. IMPRESSION: 1. Small bilateral pleural effusions, increased in size from prior study. 2. Developing opacity at the right lung base of unknown clinical significance. This could represent atelectasis or an infiltrate. A mass is not excluded. Follow-up to radiologic resolution  is recommended. 3. Bibasilar airspace opacities favored to represent atelectasis with an infiltrate not excluded. Electronically Signed   By: Constance Holster M.D.   On: 09/01/2019 18:40   Dg Chest Port 1 View  Result Date: 08/28/2019 CLINICAL DATA:  Cough and fever. Active chemotherapy for breast cancer. EXAM: PORTABLE CHEST 1 VIEW COMPARISON:  Radiograph 06/12/2019 FINDINGS: Right chest port remains in place. Unchanged heart size and mediastinal contours. No focal airspace disease, pleural effusion, or pneumothorax. No acute osseous abnormalities are seen. IMPRESSION: No acute chest findings. Electronically Signed   By: Keith Rake M.D.   On: 08/28/2019 23:43

## 2019-09-05 NOTE — Patient Instructions (Signed)
COVID-19: How to Protect Yourself and Others Know how it spreads  There is currently no vaccine to prevent coronavirus disease 2019 (COVID-19).  The best way to prevent illness is to avoid being exposed to this virus.  The virus is thought to spread mainly from person-to-person. ? Between people who are in close contact with one another (within about 6 feet). ? Through respiratory droplets produced when an infected person coughs, sneezes or talks. ? These droplets can land in the mouths or noses of people who are nearby or possibly be inhaled into the lungs. ? Some recent studies have suggested that COVID-19 may be spread by people who are not showing symptoms. Everyone should Clean your hands often  Wash your hands often with soap and water for at least 20 seconds especially after you have been in a public place, or after blowing your nose, coughing, or sneezing.  If soap and water are not readily available, use a hand sanitizer that contains at least 60% alcohol. Cover all surfaces of your hands and rub them together until they feel dry.  Avoid touching your eyes, nose, and mouth with unwashed hands. Avoid close contact  Stay home if you are sick.  Avoid close contact with people who are sick.  Put distance between yourself and other people. ? Remember that some people without symptoms may be able to spread virus. ? This is especially important for people who are at higher risk of getting very sick.www.cdc.gov/coronavirus/2019-ncov/need-extra-precautions/people-at-higher-risk.html Cover your mouth and nose with a cloth face cover when around others  You could spread COVID-19 to others even if you do not feel sick.  Everyone should wear a cloth face cover when they have to go out in public, for example to the grocery store or to pick up other necessities. ? Cloth face coverings should not be placed on young children under age 2, anyone who has trouble breathing, or is unconscious,  incapacitated or otherwise unable to remove the mask without assistance.  The cloth face cover is meant to protect other people in case you are infected.  Do NOT use a facemask meant for a healthcare worker.  Continue to keep about 6 feet between yourself and others. The cloth face cover is not a substitute for social distancing. Cover coughs and sneezes  If you are in a private setting and do not have on your cloth face covering, remember to always cover your mouth and nose with a tissue when you cough or sneeze or use the inside of your elbow.  Throw used tissues in the trash.  Immediately wash your hands with soap and water for at least 20 seconds. If soap and water are not readily available, clean your hands with a hand sanitizer that contains at least 60% alcohol. Clean and disinfect  Clean AND disinfect frequently touched surfaces daily. This includes tables, doorknobs, light switches, countertops, handles, desks, phones, keyboards, toilets, faucets, and sinks. www.cdc.gov/coronavirus/2019-ncov/prevent-getting-sick/disinfecting-your-home.html  If surfaces are dirty, clean them: Use detergent or soap and water prior to disinfection.  Then, use a household disinfectant. You can see a list of EPA-registered household disinfectants here. cdc.gov/coronavirus 02/13/2019 This information is not intended to replace advice given to you by your health care provider. Make sure you discuss any questions you have with your health care provider. Document Released: 01/23/2019 Document Revised: 02/21/2019 Document Reviewed: 01/23/2019 Elsevier Patient Education  2020 [employer]  

## 2019-09-07 ENCOUNTER — Telehealth: Payer: Self-pay

## 2019-09-07 ENCOUNTER — Telehealth: Payer: Self-pay | Admitting: Oncology

## 2019-09-07 NOTE — Telephone Encounter (Signed)
Per 11/27 schedule message updated 12/8 tx to reflect [facility] and moved 12/15 appointments to 12/22 reflecting 1st weekly taxol. Confirmed with patient.

## 2019-09-07 NOTE — Telephone Encounter (Signed)
Pt reports that daughter has tested positive for COVID.  Pt inquiring what to do about upcoming appointments on 12/1.  Pt reports that she had a COVID test last week that was negative. Pt is currently asymptomatic.  RN encouraged patient to self quarantine X 2 weeks due to person in house testing positive.  RN educated pt on guidelines of COVID per CDC guidelines.  Pt notified if symptoms were to develop to inform us.   Scheduling message sent to update schedule.

## 2019-09-11 ENCOUNTER — Inpatient Hospital Stay: Payer: 59

## 2019-09-11 ENCOUNTER — Inpatient Hospital Stay: Payer: 59 | Admitting: Oncology

## 2019-09-12 ENCOUNTER — Other Ambulatory Visit: Payer: 59

## 2019-09-12 ENCOUNTER — Ambulatory Visit: Payer: 59

## 2019-09-12 ENCOUNTER — Ambulatory Visit: Payer: 59 | Admitting: Adult Health

## 2019-09-18 ENCOUNTER — Telehealth: Payer: Self-pay | Admitting: Adult Health

## 2019-09-18 ENCOUNTER — Inpatient Hospital Stay: Payer: 59

## 2019-09-18 ENCOUNTER — Encounter: Payer: Self-pay | Admitting: Adult Health

## 2019-09-18 ENCOUNTER — Other Ambulatory Visit: Payer: Self-pay

## 2019-09-18 ENCOUNTER — Inpatient Hospital Stay: Payer: 59 | Attending: Oncology | Admitting: Adult Health

## 2019-09-18 VITALS — BP 114/62 | HR 102 | Temp 97.9°F | Resp 18 | Ht 61.0 in | Wt 215.8 lb

## 2019-09-18 DIAGNOSIS — R05 Cough: Secondary | ICD-10-CM | POA: Diagnosis not present

## 2019-09-18 DIAGNOSIS — Z170 Estrogen receptor positive status [ER+]: Secondary | ICD-10-CM

## 2019-09-18 DIAGNOSIS — C773 Secondary and unspecified malignant neoplasm of axilla and upper limb lymph nodes: Secondary | ICD-10-CM | POA: Insufficient documentation

## 2019-09-18 DIAGNOSIS — Z5111 Encounter for antineoplastic chemotherapy: Secondary | ICD-10-CM | POA: Insufficient documentation

## 2019-09-18 DIAGNOSIS — Z452 Encounter for adjustment and management of vascular access device: Secondary | ICD-10-CM | POA: Diagnosis not present

## 2019-09-18 DIAGNOSIS — C50411 Malignant neoplasm of upper-outer quadrant of right female breast: Secondary | ICD-10-CM

## 2019-09-18 DIAGNOSIS — Z95828 Presence of other vascular implants and grafts: Secondary | ICD-10-CM

## 2019-09-18 DIAGNOSIS — R600 Localized edema: Secondary | ICD-10-CM | POA: Diagnosis not present

## 2019-09-18 LAB — CBC WITH DIFFERENTIAL/PLATELET
Abs Immature Granulocytes: 0.22 10*3/uL — ABNORMAL HIGH (ref 0.00–0.07)
Basophils Absolute: 0.1 10*3/uL (ref 0.0–0.1)
Basophils Relative: 1 %
Eosinophils Absolute: 0.1 10*3/uL (ref 0.0–0.5)
Eosinophils Relative: 1 %
HCT: 32.5 % — ABNORMAL LOW (ref 36.0–46.0)
Hemoglobin: 10.3 g/dL — ABNORMAL LOW (ref 12.0–15.0)
Immature Granulocytes: 4 %
Lymphocytes Relative: 16 %
Lymphs Abs: 0.9 10*3/uL (ref 0.7–4.0)
MCH: 29.5 pg (ref 26.0–34.0)
MCHC: 31.7 g/dL (ref 30.0–36.0)
MCV: 93.1 fL (ref 80.0–100.0)
Monocytes Absolute: 0.7 10*3/uL (ref 0.1–1.0)
Monocytes Relative: 12 %
Neutro Abs: 3.9 10*3/uL (ref 1.7–7.7)
Neutrophils Relative %: 66 %
Platelets: 196 10*3/uL (ref 150–400)
RBC: 3.49 MIL/uL — ABNORMAL LOW (ref 3.87–5.11)
RDW: 17.8 % — ABNORMAL HIGH (ref 11.5–15.5)
WBC: 5.9 10*3/uL (ref 4.0–10.5)
nRBC: 0 % (ref 0.0–0.2)

## 2019-09-18 LAB — COMPREHENSIVE METABOLIC PANEL
ALT: 51 U/L — ABNORMAL HIGH (ref 0–44)
AST: 38 U/L (ref 15–41)
Albumin: 3.5 g/dL (ref 3.5–5.0)
Alkaline Phosphatase: 86 U/L (ref 38–126)
Anion gap: 9 (ref 5–15)
BUN: 10 mg/dL (ref 6–20)
CO2: 24 mmol/L (ref 22–32)
Calcium: 9.1 mg/dL (ref 8.9–10.3)
Chloride: 105 mmol/L (ref 98–111)
Creatinine, Ser: 0.68 mg/dL (ref 0.44–1.00)
GFR calc Af Amer: >60 mL/min (ref >60)
GFR calc non Af Amer: >60 mL/min (ref >60)
Glucose, Bld: 142 mg/dL — ABNORMAL HIGH (ref 70–99)
Potassium: 3.8 mmol/L (ref 3.5–5.1)
Sodium: 138 mmol/L (ref 135–145)
Total Bilirubin: 0.9 mg/dL (ref 0.3–1.2)
Total Protein: 6.3 g/dL — ABNORMAL LOW (ref 6.5–8.1)

## 2019-09-18 LAB — HCG, QUANTITATIVE, PREGNANCY: hCG, Beta Chain, Quant, S: 4 m[IU]/mL (ref <5)

## 2019-09-18 MED ORDER — SODIUM CHLORIDE 0.9% FLUSH
10.0000 mL | INTRAVENOUS | Status: DC | PRN
  Filled 2019-09-18: qty 10

## 2019-09-18 NOTE — Progress Notes (Signed)
[facility]  [phone] [phone]     ID: Veronica [name] DOB: Sep 10, 1971  MR#: [id number]  WUG#:[id number]  Patient Care Team: Blair Heys, Hershal Coria as PCP - General (Physician Assistant) Mauro Kaufmann, RN as Oncology Nurse Navigator [name], RN as Oncology Nurse Navigator Erroll Luna, MD as Consulting Physician (General Surgery) Magrinat, Virgie Dad, MD as Consulting Physician (Oncology) Kyung Rudd, MD as Consulting Physician (Radiation Oncology) Scot Dock, NP OTHER MD:  CHIEF COMPLAINT: Estrogen receptor positive breast cancer  CURRENT TREATMENT:  adjuvant chemotherapy   INTERVAL HISTORY: Veronica [name] returns today for follow-up and treatment of her estrogen receptor positive breast cancer.   She is receiving adjuvant chemotherapy consisting of doxorubicin and cyclophosphamide in dose dense fashion x4, to be followed by weekly paclitaxel x12.  Today is day 1 cycle 4 doxorubicin and cyclophosphamide, with Onpro support.    After cycle 3 of her chemotherapy with Doxorubicin and Cyclophsaphmide she developed a fever and increased shortness of breath.  She went to the ER and was there for two days.  She received IV fluids and antibiotics.  A few days after that occurrence she developed increased swelling, particularly in her arms, and legs.  She had gained about 19 pounds.  A BNP was elevated.  She was diuresed and was also prescribed Lasix and potassium and has diuresed well.  She is down 11 pounds from 11/23. She is at her normal weight.  Her most recent echo was on 9/15 and was normal.    REVIEW OF SYSTEMS: Veronica [name] is feeling moderately well today.  She notes some mild swelling in her face, that is worse in the morning, particularly around her eyes.  She continues to have an intermittent chronic cough.  She has no swelling in her legs.  She denies orthopnea due to shortness of breath, but does sleep propped up due to the facial swelling she has  had.    Veronica [name] denies any fever, chills, chest pain, bowel/bladder changes, headaches, vision issues, nausea, vomiting, or any other concerns today.    HISTORY OF CURRENT ILLNESS: From the original intake note:  Veronica [name] had routine screening mammography on 04/27/2019 showing a possible abnormality in the right breast. She underwent right diagnostic mammography with tomography and right breast ultrasonography at [facility] on 05/01/2019 showing: breast density category C; highly suspicious 1.7 cm irregular mass in the right breast at 10 o'clock, 8 cm from the nipple; indeterminate hypoechoic round mass in the right axillary tail/low axilla. Physical exam showed no suspicious lumps.  Accordingly on 05/02/2019 she proceeded to biopsy of the right breast area in question. The pathology from this procedure ([id number]) showed: invasive ductal carcinoma, grade 3; ductal carcinoma in situ; lymphovascular invasion present. Prognostic indicators significant for: estrogen receptor, 30% positive with moderate staining intensity, and progesterone receptor, 30% positive with strong staining intensity. Proliferation marker Ki67 at 20%. HER2 negative by immunohistochemistry (1+).  The second "satellite" lesion was a lymph node which was also positive.  The patient's subsequent history is as detailed below.   PAST MEDICAL HISTORY: Past Medical History:  Diagnosis Date  . Cancer ([place])   . Chronic low back pain with left-sided sciatica   . Family history of leukemia   . Family history of stomach cancer   . Hypertension     PAST SURGICAL HISTORY: Past Surgical History:  Procedure Laterality Date  . BREAST BIOPSY Right 05/02/2019   right clips X2  . BREAST  LUMPECTOMY WITH RADIOACTIVE SEED AND SENTINEL LYMPH NODE BIOPSY Right 06/12/2019   Procedure: RIGHT BREAST RADIOACTIVE SEED LUMPECTOMY  AND RIGHT AXILLARY SEED TARGETED LYMPH NODE AND AXILLARY SENTINEL LYMPH NODE MAPPING;  Surgeon: Erroll Luna, MD;  Location: [facility];  Service: General;  Laterality: Right;  PEC BLOCK  . C/S x2  '98 '03  . CESAREAN SECTION    . CHOLECYSTECTOMY    . LAPAROSCOPIC ASSISTED VAGINAL HYSTERECTOMY  05/17/2011   Procedure: LAPAROSCOPIC ASSISTED VAGINAL HYSTERECTOMY;  Surgeon: Sharene Butters;  Location: [facility];  Service: Gynecology;  Laterality: N/A;  Laparoscopic Assisted Vaginal Hysterectomy With Lysis Of Adhesions  . PORTACATH PLACEMENT Right 06/12/2019   Procedure: INSERTION PORT-A-CATH WITH ULTRASOUND;  Surgeon: Erroll Luna, MD;  Location: [facility];  Service: General;  Laterality: Right;  . RE-EXCISION OF BREAST LUMPECTOMY Right 07/17/2019   Procedure: RE-EXCISION OF RIGHT BREAST LUMPECTOMY;  Surgeon: Erroll Luna, MD;  Location: [facility];  Service: General;  Laterality: Right;  . TUBAL LIGATION      FAMILY HISTORY: Family History  Problem Relation Age of Onset  . Arthritis Mother   . COPD Mother   . Hypertension Mother   . Hyperlipidemia Mother   . Leukemia Brother 79  . Stomach cancer Maternal Grandmother        late 74s   Patient's parents are living (as of September 2020). Her father is 2 and her mother is 73 as of 04/2019. The patient denies a family hx of breast or ovarian cancer. She has 7 siblings, 6 brothers and 1 sister. She reports her maternal grandmother was diagnosed with stomach cancer at age 21. Her brother was diagnosed with leukemia at age 88.   GYNECOLOGIC HISTORY:  No LMP recorded. Patient has had a hysterectomy. Menarche: 48 years old Age at first live birth: 48 years old GX P 2 (+1 stillborn) LMP age 12-42 Contraceptive: unsure, maybe for a year at age 87. HRT no Hysterectomy? Yes, 05/2011 BSO? 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 She is married. Husband Sheppard Plumber") is a Hydrologist for a telephone company. She lives at home with her husband and  two daughters. Daughter Suszanne Conners, age 71, is a Scientist, water quality. Daughter Veronica [name], age 26, is a Ship broker.     ADVANCED DIRECTIVES: Husband Heath Lark is her HCPOA.   HEALTH MAINTENANCE: Social History   Tobacco Use  . Smoking status: Never Smoker  . Smokeless tobacco: Never Used  Substance Use Topics  . Alcohol use: Yes    Alcohol/week: 2.0 standard drinks    Types: 2 Standard drinks or equivalent per week    Comment: "2-3 a week"  . Drug use: No     Colonoscopy: n/a  PAP: 02/2018  Bone density: n/a   No Known Allergies  Current Outpatient Medications  Medication Sig Dispense Refill  . acetaminophen (TYLENOL) 500 MG tablet Take 500 mg by mouth every 6 (six) hours as needed for mild pain.     . Cholecalciferol (VITAMIN D3) 25 MCG (1000 UT) CHEW Chew 1 tablet by mouth daily.     Marland Kitchen dexamethasone (DECADRON) 4 MG tablet TAKE TWO TABLETS BY MOUTH ONCE A DAY ON THE DAY AFTER CHEMOTHERAPY AND THEN TAKE TWO TABLETS BY MOUTH TWICE DAILY FOR 2 DAYS 30 tablet 1  . famotidine (PEPCID) 10 MG tablet Take 10 mg by mouth 2 (two) times daily.    Marland Kitchen lidocaine-prilocaine (EMLA) cream Apply to  affected area once 30 g 3  . loratadine (CLARITIN) 10 MG tablet Take 10 mg by mouth See admin instructions. Takes for 5 days after chemo    . LORazepam (ATIVAN) 0.5 MG tablet Take 1 tablet (0.5 mg total) by mouth at bedtime as needed (Nausea or vomiting). 30 tablet 0  . olmesartan-hydrochlorothiazide (BENICAR HCT) 20-12.5 MG tablet Take 1 tablet by mouth daily.    . ondansetron (ZOFRAN) 8 MG tablet Take 1 tablet (8 mg total) by mouth every 8 (eight) hours as needed for nausea or vomiting. 20 tablet 0  . prochlorperazine (COMPAZINE) 10 MG tablet Take 1 tablet (10 mg total) by mouth every 6 (six) hours as needed (Nausea or vomiting). 30 tablet 1  . furosemide (LASIX) 20 MG tablet Take 1 tablet (20 mg total) by mouth daily for 3 days. 3 tablet 0  . potassium chloride SA (KLOR-CON) 20 MEQ tablet Take 2 tablets (40 mEq total) by  mouth 2 (two) times daily for 3 days. 12 tablet 0   No current facility-administered medications for this visit.     OBJECTIVE:   Vitals:   09/18/19 0933  BP: 114/62  Pulse: (!) 102  Resp: 18  Temp: 97.9 F (36.6 C)  SpO2: 100%   Wt Readings from Last 3 Encounters:  09/18/19 215 lb 12.8 oz (97.9 kg)  09/05/19 221 lb 4.8 oz (100.4 kg)  09/03/19 226 lb 4.8 oz (102.6 kg)   Body mass index is 40.78 kg/m.    ECOG FS:1 - Symptomatic but completely ambulatory GENERAL: Patient is a well appearing female in no acute distress HEENT:  Sclerae anicteric.  Mask in Place.  Periorbital edema noted.  Neck is supple.  NODES:  No cervical, supraclavicular, or axillary lymphadenopathy palpated.  BREAST EXAM:  Deferred. LUNGS:  Clear to auscultation bilaterally.  No wheezes or rhonchi. HEART:  Regular rate and rhythm. No murmur appreciated. ABDOMEN:  Soft, nontender.  Positive, normoactive bowel sounds. No organomegaly palpated. MSK:  No focal spinal tenderness to palpation. Full range of motion bilaterally in the upper extremities. EXTREMITIES:  No peripheral edema.   SKIN:  Clear with no obvious rashes or skin changes. No nail dyscrasia. NEURO:  Nonfocal. Well oriented.  Appropriate affect.     LAB RESULTS:  CMP     Component Value Date/Time   NA 138 09/18/2019 0904   K 3.8 09/18/2019 0904   CL 105 09/18/2019 0904   CO2 24 09/18/2019 0904   GLUCOSE 142 (H) 09/18/2019 0904   BUN 10 09/18/2019 0904   CREATININE 0.68 09/18/2019 0904   CREATININE 0.71 09/05/2019 1030   CALCIUM 9.1 09/18/2019 0904   PROT 6.3 (L) 09/18/2019 0904   ALBUMIN 3.5 09/18/2019 0904   AST 38 09/18/2019 0904   AST 18 05/09/2019 1225   ALT 51 (H) 09/18/2019 0904   ALT 23 05/09/2019 1225   ALKPHOS 86 09/18/2019 0904   BILITOT 0.9 09/18/2019 0904   BILITOT 0.7 05/09/2019 1225   GFRNONAA >60 09/18/2019 0904   GFRNONAA >60 09/05/2019 1030   GFRAA >60 09/18/2019 0904   GFRAA >60 09/05/2019 1030    No  results found for: TOTALPROTELP, ALBUMINELP, A1GS, A2GS, BETS, BETA2SER, GAMS, MSPIKE, SPEI  No results found for: KPAFRELGTCHN, LAMBDASER, KAPLAMBRATIO  Lab Results  Component Value Date   WBC 5.9 09/18/2019   NEUTROABS 3.9 09/18/2019   HGB 10.3 (L) 09/18/2019   HCT 32.5 (L) 09/18/2019   MCV 93.1 09/18/2019   PLT 196 09/18/2019    @  LASTCHEMISTRY@  No results found for: LABCA2  No components found for: LKGMWN027  No results for input(s): INR in the last 168 hours.  No results found for: LABCA2  No results found for: OZD664  No results found for: QIH474  No results found for: QVZ563  No results found for: CA2729  No components found for: HGQUANT  No results found for: CEA1 / No results found for: CEA1   No results found for: AFPTUMOR  No results found for: CHROMOGRNA  No results found for: [place] Visit on 09/18/2019  Component Date Value Ref Range Status  . hCG, Beta Chain, Quant, S 09/18/2019 4  <5 mIU/mL Final   Comment:          GEST. AGE      CONC.  (mIU/mL)   <=1 WEEK        5 - 50     2 WEEKS       50 - 500     3 WEEKS       100 - 10,000     4 WEEKS     1,000 - 30,000     5 WEEKS     3,500 - 115,000   6-8 WEEKS     12,000 - 270,000    12 WEEKS     15,000 - 220,000        FEMALE AND NON-PREGNANT FEMALE:     LESS THAN 5 mIU/mL Performed at [facility], [place] [address], [place], [place] [address]   Appointment on 09/18/2019  Component Date Value Ref Range Status  . Sodium 09/18/2019 138  135 - 145 mmol/L Final  . Potassium 09/18/2019 3.8  3.5 - 5.1 mmol/L Final  . Chloride 09/18/2019 105  98 - 111 mmol/L Final  . CO2 09/18/2019 24  22 - 32 mmol/L Final  . Glucose, Bld 09/18/2019 142* 70 - 99 mg/dL Final  . BUN 09/18/2019 10  6 - 20 mg/dL Final  . Creatinine, Ser 09/18/2019 0.68  0.44 - 1.00 mg/dL Final  . Calcium 09/18/2019 9.1  8.9 - 10.3 mg/dL Final  . Total Protein 09/18/2019 6.3* 6.5 - 8.1 g/dL Final  . Albumin  09/18/2019 3.5  3.5 - 5.0 g/dL Final  . AST 09/18/2019 38  15 - 41 U/L Final  . ALT 09/18/2019 51* 0 - 44 U/L Final  . Alkaline Phosphatase 09/18/2019 86  38 - 126 U/L Final  . Total Bilirubin 09/18/2019 0.9  0.3 - 1.2 mg/dL Final  . GFR calc non Af Amer 09/18/2019 >60  >60 mL/min Final  . GFR calc Af Amer 09/18/2019 >60  >60 mL/min Final  . Anion gap 09/18/2019 9  5 - 15 Final   Performed at [facility], [place] [address], [place], [place] [address]  . WBC 09/18/2019 5.9  4.0 - 10.5 K/uL Final  . RBC 09/18/2019 3.49* 3.87 - 5.11 MIL/uL Final  . Hemoglobin 09/18/2019 10.3* 12.0 - 15.0 g/dL Final  . HCT 09/18/2019 32.5* 36.0 - 46.0 % Final  . MCV 09/18/2019 93.1  80.0 - 100.0 fL Final  . MCH 09/18/2019 29.5  26.0 - 34.0 pg Final  . MCHC 09/18/2019 31.7  30.0 - 36.0 g/dL Final  . RDW 09/18/2019 17.8* 11.5 - 15.5 % Final  . Platelets 09/18/2019 196  150 - 400 K/uL Final  . nRBC 09/18/2019 0.0  0.0 - 0.2 % Final  . Neutrophils Relative % 09/18/2019 66  % Final  .  Neutro Abs 09/18/2019 3.9  1.7 - 7.7 K/uL Final  . Lymphocytes Relative 09/18/2019 16  % Final  . Lymphs Abs 09/18/2019 0.9  0.7 - 4.0 K/uL Final  . Monocytes Relative 09/18/2019 12  % Final  . Monocytes Absolute 09/18/2019 0.7  0.1 - 1.0 K/uL Final  . Eosinophils Relative 09/18/2019 1  % Final  . Eosinophils Absolute 09/18/2019 0.1  0.0 - 0.5 K/uL Final  . Basophils Relative 09/18/2019 1  % Final  . Basophils Absolute 09/18/2019 0.1  0.0 - 0.1 K/uL Final  . Immature Granulocytes 09/18/2019 4  % Final  . Abs Immature Granulocytes 09/18/2019 0.22* 0.00 - 0.07 K/uL Final   Performed at [facility], [address] [place], [place] [address]    (this displays the last labs from the last 3 days)  No results found for: TOTALPROTELP, ALBUMINELP, A1GS, A2GS, BETS, BETA2SER, GAMS, MSPIKE, SPEI (this displays SPEP labs)  No results found for: KPAFRELGTCHN, LAMBDASER, KAPLAMBRATIO  (kappa/lambda light chains)  No results found for: HGBA, HGBA2QUANT, HGBFQUANT, HGBSQUAN (Hemoglobinopathy evaluation)   No results found for: LDH  No results found for: IRON, TIBC, IRONPCTSAT (Iron and TIBC)  No results found for: FERRITIN  Urinalysis    Component Value Date/Time   COLORURINE YELLOW 08/28/2019 2326   APPEARANCEUR CLOUDY (A) 08/28/2019 2326   LABSPEC 1.013 08/28/2019 2326   PHURINE 5.0 08/28/2019 2326   GLUCOSEU NEGATIVE 08/28/2019 2326   HGBUR MODERATE (A) 08/28/2019 2326   BILIRUBINUR NEGATIVE 08/28/2019 2326   KETONESUR NEGATIVE 08/28/2019 2326   PROTEINUR NEGATIVE 08/28/2019 2326   NITRITE NEGATIVE 08/28/2019 2326   LEUKOCYTESUR MODERATE (A) 08/28/2019 2326     STUDIES: Dg Chest 1 View  Result Date: 08/30/2019 CLINICAL DATA:  Cough.  History of breast cancer. EXAM: CHEST  1 VIEW COMPARISON:  08/28/2019 FINDINGS: Stable position of the right jugular Port-A-Cath with the tip in the lower SVC. Densities in the right lower chest appear to be related to overlying breast tissue. No focal airspace disease. No evidence for pulmonary edema. Heart and mediastinum are within normal limits. Trachea is midline. IMPRESSION: No acute cardiopulmonary disease. Electronically Signed   By: Markus Daft M.D.   On: 08/30/2019 09:42   Dg Chest 2 View  Result Date: 09/01/2019 CLINICAL DATA:  Chest pain EXAM: CHEST - 2 VIEW COMPARISON:  August 30, 2019 FINDINGS: There is a well-positioned right-sided Port-A-Cath. There are small bilateral pleural effusions, right greater than left. There is an airspace opacity at the right lung base with new perihilar linear airspace opacities on the right. There is no pneumothorax. No large pleural effusion. No acute osseous abnormality. IMPRESSION: 1. Small bilateral pleural effusions, increased in size from prior study. 2. Developing opacity at the right lung base of unknown clinical significance. This could represent atelectasis or an  infiltrate. A mass is not excluded. Follow-up to radiologic resolution is recommended. 3. Bibasilar airspace opacities favored to represent atelectasis with an infiltrate not excluded. Electronically Signed   By: Constance Holster M.D.   On: 09/01/2019 18:40   Dg Chest Port 1 View  Result Date: 08/28/2019 CLINICAL DATA:  Cough and fever. Active chemotherapy for breast cancer. EXAM: PORTABLE CHEST 1 VIEW COMPARISON:  Radiograph 06/12/2019 FINDINGS: Right chest port remains in place. Unchanged heart size and mediastinal contours. No focal airspace disease, pleural effusion, or pneumothorax. No acute osseous abnormalities are seen. IMPRESSION: No acute chest findings. Electronically Signed   By: Aurther Loft.D.  On: 08/28/2019 23:43    ELIGIBLE FOR AVAILABLE RESEARCH PROTOCOL: no  ASSESSMENT: 48 y.o. [place], [place] woman status post right breast upper outer quadrant biopsy 05/01/2019 for a clinical T1c N1, stage IIA invasive ductal carcinoma, grade 3, estrogen and progesterone receptor positive, HER-2 nonamplified, with an MIB-1-1 of 20%.  (a) mass in the axillary tail was a positive lymph node  (1) MammaPrint obtained from the original biopsy shows a high risk luminal subtype B tumor  (2) genetics testing 05/09/2019 through the Common Hereditary Gene Panel offered by [facility] found no deleterious mutations in APC, ATM, AXIN2, BARD1, BMPR1A, BRCA1, BRCA2, BRIP1, CDH1, CDK4, CDKN2A (p14ARF), CDKN2A (p16INK4a), CHEK2, CTNNA1, DICER1, EPCAM (Deletion/duplication testing only), GREM1 (promoter region deletion/duplication testing only), KIT, MEN1, MLH1, MSH2, MSH3, MSH6, MUTYH, NBN, NF1, NHTL1, PALB2, PDGFRA, PMS2, POLD1, POLE, PTEN, RAD50, RAD51C, RAD51D, RNF43, SDHB, SDHC, SDHD, SMAD4, SMARCA4. STK11, TP53, TSC1, TSC2, and VHL.  The following genes were evaluated for sequence changes only: SDHA and HOXB13 c.251G>A variant only.   (a) A variant of uncertain significance (VUS) was detected in one of  her MSH6 genes (c.831A>C).  (3) status post right lumpectomy and sentinel lymph node sampling 06/12/2019 for a pT2 pN1, stage IIA invasive ductal carcinoma, grade 2, with positive margins  (a) a total of 4 sentinel lymph nodes removed, one positive (with ECE), ine itc  (b) margin clearance 04/19/2019 successful medial margin close but negative for DCIS  (4) adjuvant chemotherapy will consist of doxorubicin and cyclophosphamide in dose dense fashion x4 starting 07/10/2019 followed by weekly paclitaxel x12  (a) echo 06/26/2019 shows an EF of 60-65%  (5) adjuvant radiation to follow  (5) antiestrogens to start at the completion of local treatment  PLAN:  Aicia is doing better today.  She has diuresed well and is back to her pre treatment weight.  She does still have some facial edema.  Due to her issues, and her recent elevated BNP, I ordered a repeat echocardiogram.  She will not receive the Doxorubicin and cyclophosphamide again until we get that echocardiogram completed.  I placed an order for this to happen ASAP.  She will continue on Furosemide and potassium for this.    Her HCG was slightly increased at her ER visit.  We will repeat one today.  She notes she hasn't been sexually active since she prior to being diagnosed with cancer.  Yvana is in agreement with the above plan.  I reviewed it with Dr. Jana Hakim in detail and he is in agreement as well.  We will see her back next week for labs, f/u, and tentatively cycle 4 of Doxorubicin and Cyclophosphamide.    She was recommended to continue with the appropriate pandemic precautions. She knows to call for any other issues that may develop before the next visit.  A total of (20) minutes of face-to-face time was spent with this patient with greater than 50% of that time in counseling and care-coordination.   Wilber Bihari, NP Medical Oncology and Hematology [facility] [place], [place] [address] Tel.  [phone]    Fax. [phone]

## 2019-09-18 NOTE — Telephone Encounter (Signed)
I talk with patient

## 2019-09-19 ENCOUNTER — Ambulatory Visit (HOSPITAL_COMMUNITY)
Admission: RE | Admit: 2019-09-19 | Discharge: 2019-09-19 | Disposition: A | Discharge status: Home / Self Care | Payer: 59 | Source: Ambulatory Visit | Attending: Adult Health | Admitting: Adult Health

## 2019-09-19 DIAGNOSIS — Z170 Estrogen receptor positive status [ER+]: Secondary | ICD-10-CM | POA: Insufficient documentation

## 2019-09-19 DIAGNOSIS — C50411 Malignant neoplasm of upper-outer quadrant of right female breast: Secondary | ICD-10-CM | POA: Diagnosis not present

## 2019-09-19 DIAGNOSIS — Z01810 Encounter for preprocedural cardiovascular examination: Secondary | ICD-10-CM | POA: Insufficient documentation

## 2019-09-19 NOTE — Progress Notes (Signed)
*  2D Echocardiogram has been performed.  Veronica [name] 09/19/2019, 11:34 AM

## 2019-09-20 ENCOUNTER — Telehealth: Payer: Self-pay

## 2019-09-20 NOTE — Telephone Encounter (Signed)
Left vm for patient to inform that Echo is normal.  Left msg for patient to call back with questions/concerns.

## 2019-09-20 NOTE — Telephone Encounter (Signed)
-----   Message from Gardenia Phlegm, NP sent at 09/20/2019  9:52 AM EST ----- Pleaes call her and let her know echo is normal.  thanks ----- Message ----- From: Interface, [employer] Sent: 09/19/2019   4:16 PM EST To: Gardenia Phlegm, NP

## 2019-09-24 ENCOUNTER — Other Ambulatory Visit: Payer: Self-pay

## 2019-09-24 ENCOUNTER — Inpatient Hospital Stay: Payer: 59

## 2019-09-24 ENCOUNTER — Encounter: Payer: Self-pay | Admitting: Oncology

## 2019-09-24 ENCOUNTER — Encounter: Payer: Self-pay | Admitting: Adult Health

## 2019-09-24 ENCOUNTER — Inpatient Hospital Stay (HOSPITAL_BASED_OUTPATIENT_CLINIC_OR_DEPARTMENT_OTHER): Payer: 59 | Admitting: Adult Health

## 2019-09-24 ENCOUNTER — Ambulatory Visit (HOSPITAL_COMMUNITY)
Admission: RE | Admit: 2019-09-24 | Discharge: 2019-09-24 | Disposition: A | Discharge status: Home / Self Care | Payer: 59 | Source: Ambulatory Visit | Attending: Adult Health | Admitting: Adult Health

## 2019-09-24 VITALS — BP 116/74 | HR 94 | Temp 98.9°F | Resp 18 | Ht 61.0 in | Wt 215.2 lb

## 2019-09-24 DIAGNOSIS — Z170 Estrogen receptor positive status [ER+]: Secondary | ICD-10-CM

## 2019-09-24 DIAGNOSIS — C50411 Malignant neoplasm of upper-outer quadrant of right female breast: Secondary | ICD-10-CM | POA: Insufficient documentation

## 2019-09-24 DIAGNOSIS — Z5111 Encounter for antineoplastic chemotherapy: Secondary | ICD-10-CM | POA: Diagnosis not present

## 2019-09-24 DIAGNOSIS — Z95828 Presence of other vascular implants and grafts: Secondary | ICD-10-CM

## 2019-09-24 LAB — COMPREHENSIVE METABOLIC PANEL
ALT: 34 U/L (ref 0–44)
AST: 28 U/L (ref 15–41)
Albumin: 3.7 g/dL (ref 3.5–5.0)
Alkaline Phosphatase: 69 U/L (ref 38–126)
Anion gap: 10 (ref 5–15)
BUN: 7 mg/dL (ref 6–20)
CO2: 22 mmol/L (ref 22–32)
Calcium: 9.1 mg/dL (ref 8.9–10.3)
Chloride: 106 mmol/L (ref 98–111)
Creatinine, Ser: 0.64 mg/dL (ref 0.44–1.00)
GFR calc Af Amer: >60 mL/min (ref >60)
GFR calc non Af Amer: >60 mL/min (ref >60)
Glucose, Bld: 127 mg/dL — ABNORMAL HIGH (ref 70–99)
Potassium: 3.6 mmol/L (ref 3.5–5.1)
Sodium: 138 mmol/L (ref 135–145)
Total Bilirubin: 0.9 mg/dL (ref 0.3–1.2)
Total Protein: 6.4 g/dL — ABNORMAL LOW (ref 6.5–8.1)

## 2019-09-24 LAB — CBC WITH DIFFERENTIAL/PLATELET
Abs Immature Granulocytes: 0.04 10*3/uL (ref 0.00–0.07)
Basophils Absolute: 0 10*3/uL (ref 0.0–0.1)
Basophils Relative: 0 %
Eosinophils Absolute: 0.2 10*3/uL (ref 0.0–0.5)
Eosinophils Relative: 3 %
HCT: 33.6 % — ABNORMAL LOW (ref 36.0–46.0)
Hemoglobin: 11 g/dL — ABNORMAL LOW (ref 12.0–15.0)
Immature Granulocytes: 1 %
Lymphocytes Relative: 12 %
Lymphs Abs: 0.8 10*3/uL (ref 0.7–4.0)
MCH: 29.9 pg (ref 26.0–34.0)
MCHC: 32.7 g/dL (ref 30.0–36.0)
MCV: 91.3 fL (ref 80.0–100.0)
Monocytes Absolute: 0.6 10*3/uL (ref 0.1–1.0)
Monocytes Relative: 10 %
Neutro Abs: 4.9 10*3/uL (ref 1.7–7.7)
Neutrophils Relative %: 74 %
Platelets: 178 10*3/uL (ref 150–400)
RBC: 3.68 MIL/uL — ABNORMAL LOW (ref 3.87–5.11)
RDW: 15.9 % — ABNORMAL HIGH (ref 11.5–15.5)
WBC: 6.5 10*3/uL (ref 4.0–10.5)
nRBC: 0 % (ref 0.0–0.2)

## 2019-09-24 IMAGING — CR DG CHEST 2V
2 series · 2 of 2 positions shown · non-contrast
Comparison: [DATE] chest radiograph.

CLINICAL DATA: Breast cancer.  Recent completion of chemotherapy.

EXAM:
CHEST - 2 VIEW

[w chest pa]
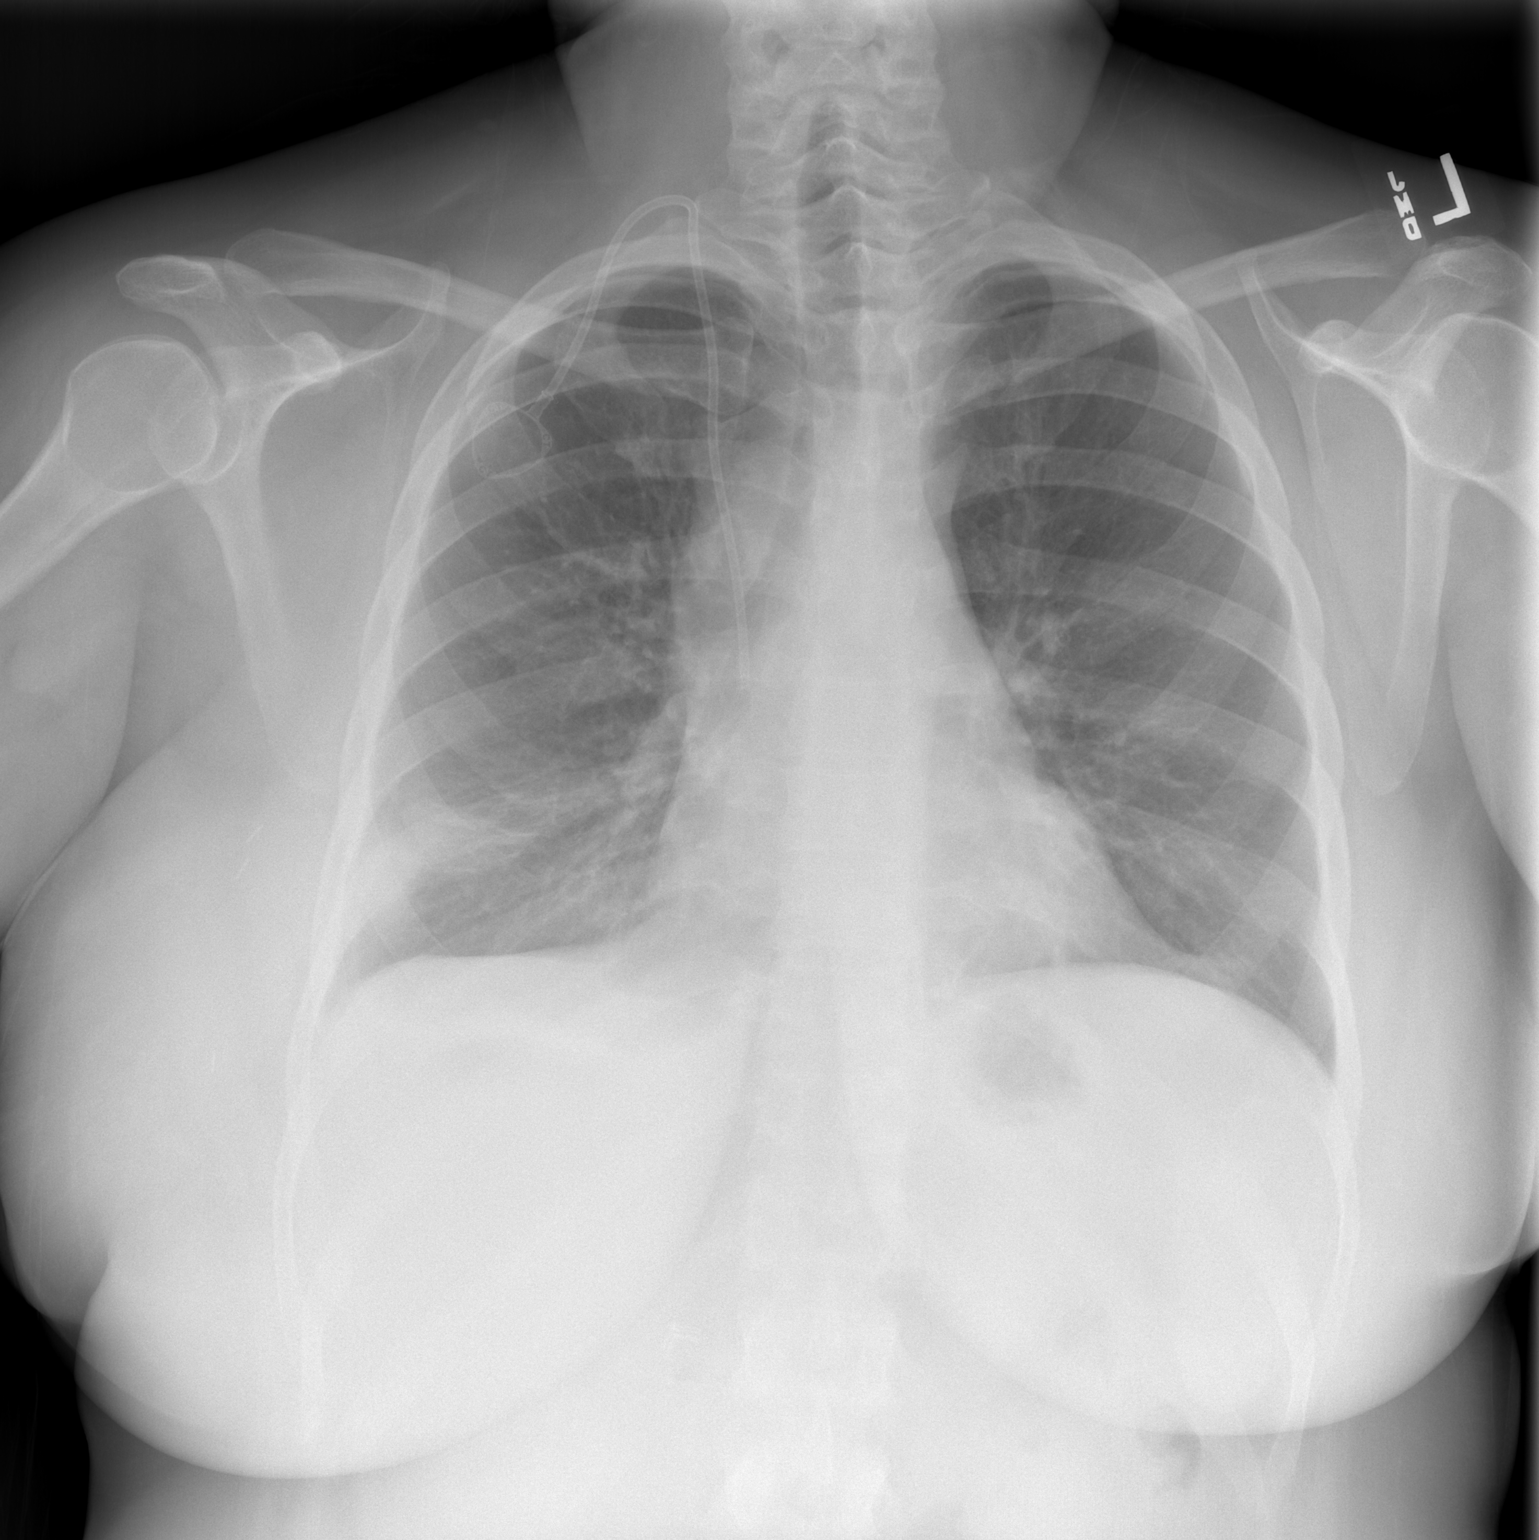

[w chest lat]
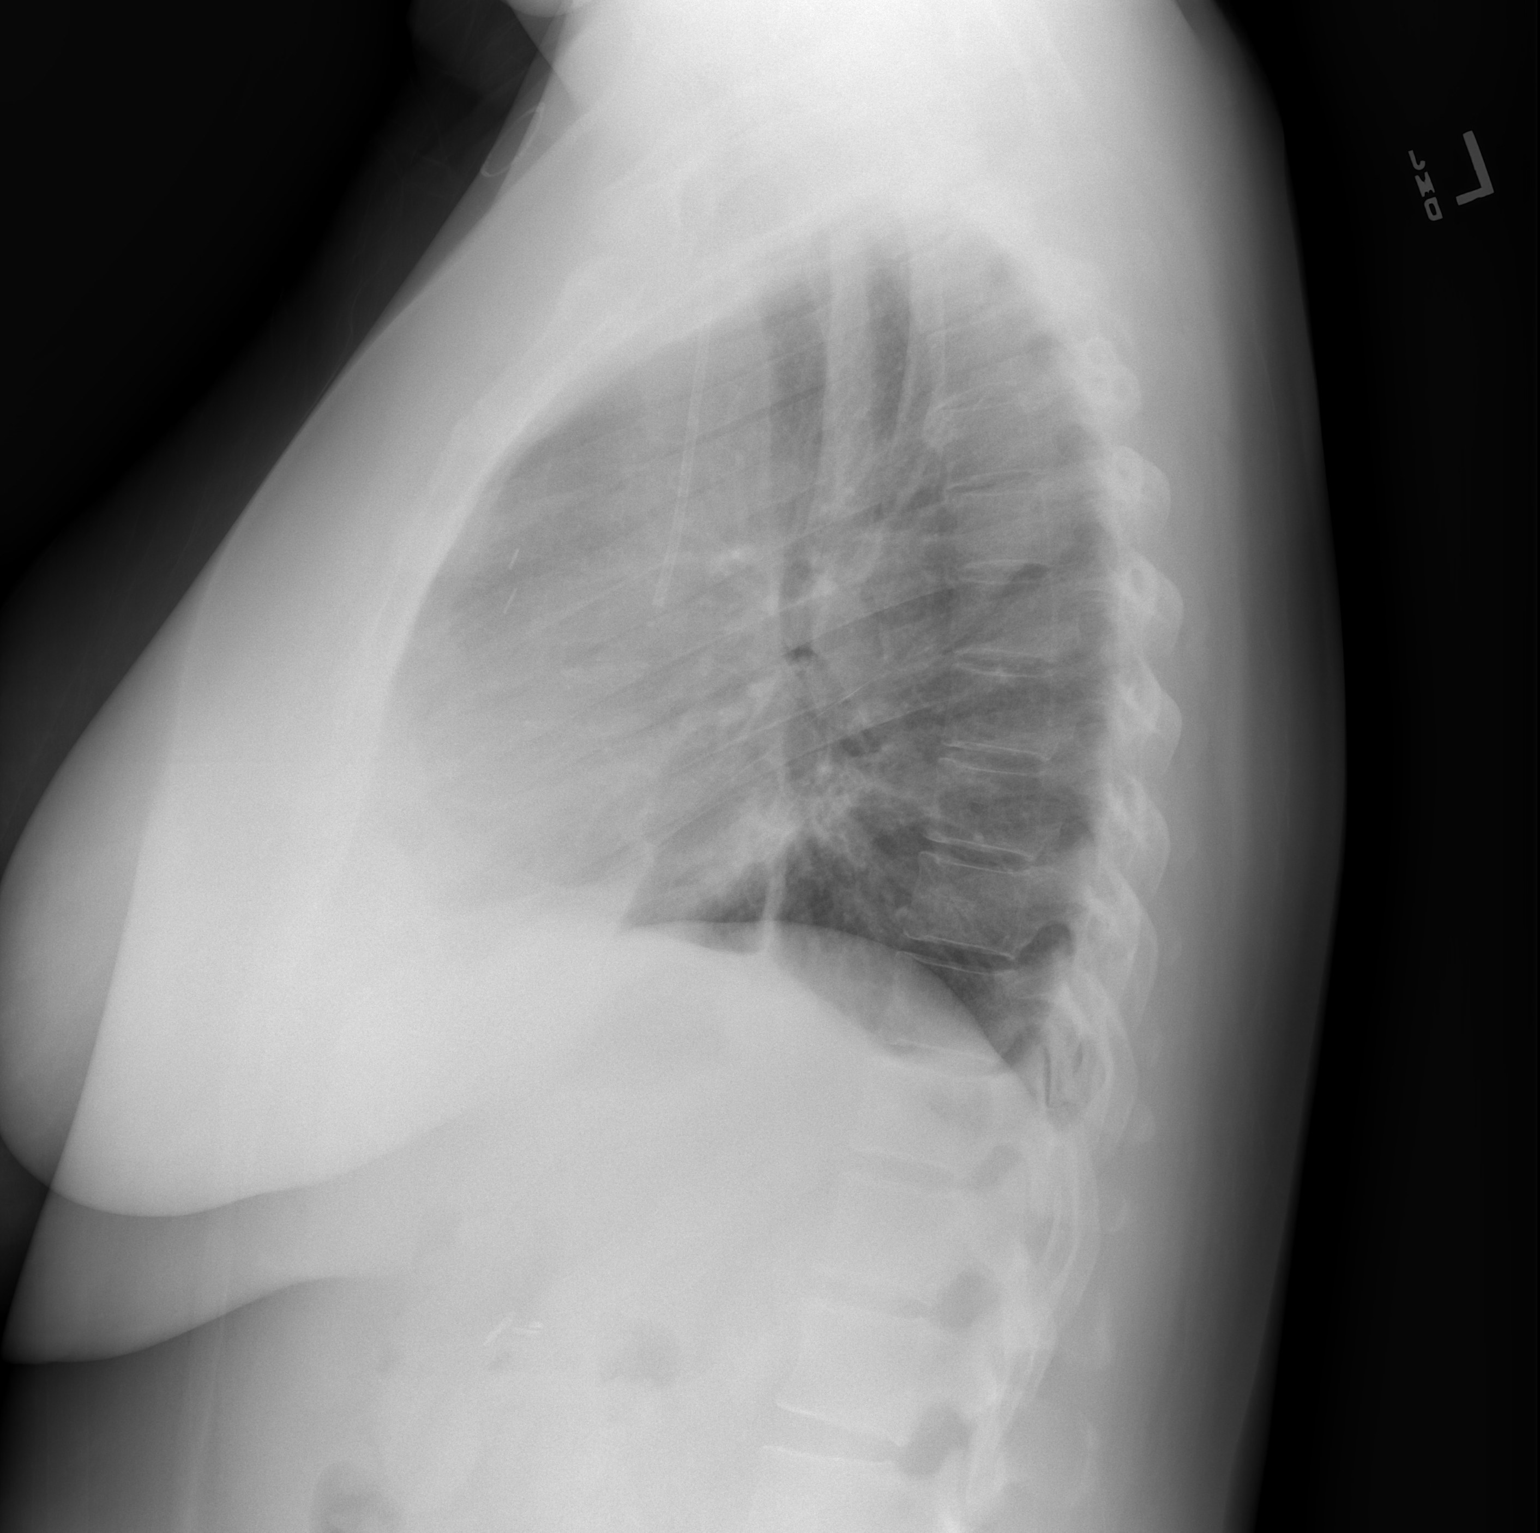

[2 of 2 positions shown; findings below may reference images not displayed]

FINDINGS: Right internal jugular Port-A-Cath terminates at the cavoatrial
junction. Stable cardiomediastinal silhouette with normal heart
size. No pneumothorax. Resolved pleural effusions. Persistent patchy
peripheral lower right lung opacity. Otherwise clear lungs.
IMPRESSION: 1. Resolved pleural effusions.
2. Persistent patchy peripheral lower right lung opacity,
indeterminate for infectious/inflammatory process or loculated
pleural fluid in the fissure, with neoplasm not excluded. Chest CT
suggested for further evaluation.

## 2019-09-24 MED ORDER — SODIUM CHLORIDE 0.9% FLUSH
10.0000 mL | INTRAVENOUS | Status: DC | PRN
  Administered 2019-09-24: 10 mL
  Filled 2019-09-24: qty 10

## 2019-09-24 MED ORDER — PALONOSETRON HCL INJECTION 0.25 MG/5ML
INTRAVENOUS | Status: AC
  Filled 2019-09-24: qty 5

## 2019-09-24 MED ORDER — ALTEPLASE 2 MG IJ SOLR
2.0000 mg | Freq: Once | INTRAMUSCULAR | Status: AC | PRN
  Administered 2019-09-24: 2 mg
  Filled 2019-09-24: qty 2

## 2019-09-24 MED ORDER — PALONOSETRON HCL INJECTION 0.25 MG/5ML
0.2500 mg | Freq: Once | INTRAVENOUS | Status: AC
  Administered 2019-09-24: 0.25 mg via INTRAVENOUS

## 2019-09-24 MED ORDER — ALTEPLASE 2 MG IJ SOLR
INTRAMUSCULAR | Status: AC
  Filled 2019-09-24: qty 2

## 2019-09-24 MED ORDER — SODIUM CHLORIDE 0.9% FLUSH
10.0000 mL | INTRAVENOUS | Status: DC | PRN
  Administered 2019-09-24: 09:00:00 10 mL
  Filled 2019-09-24: qty 10

## 2019-09-24 MED ORDER — SODIUM CHLORIDE 0.9 % IV SOLN
480.0000 mg/m2 | Freq: Once | INTRAVENOUS | Status: AC
  Administered 2019-09-24: 1000 mg via INTRAVENOUS
  Filled 2019-09-24: qty 50

## 2019-09-24 MED ORDER — DOXORUBICIN HCL CHEMO IV INJECTION 2 MG/ML
48.0000 mg/m2 | Freq: Once | INTRAVENOUS | Status: AC
  Administered 2019-09-24: 100 mg via INTRAVENOUS
  Filled 2019-09-24: qty 50

## 2019-09-24 MED ORDER — PEGFILGRASTIM 6 MG/0.6ML ~~LOC~~ PSKT
6.0000 mg | PREFILLED_SYRINGE | Freq: Once | SUBCUTANEOUS | Status: AC
  Administered 2019-09-24: 6 mg via SUBCUTANEOUS

## 2019-09-24 MED ORDER — HEPARIN SOD (PORK) LOCK FLUSH 100 UNIT/ML IV SOLN
500.0000 [IU] | Freq: Once | INTRAVENOUS | Status: AC | PRN
  Administered 2019-09-24: 500 [IU]
  Filled 2019-09-24: qty 5

## 2019-09-24 MED ORDER — PEGFILGRASTIM 6 MG/0.6ML ~~LOC~~ PSKT
PREFILLED_SYRINGE | SUBCUTANEOUS | Status: AC
  Filled 2019-09-24: qty 0.6

## 2019-09-24 MED ORDER — SODIUM CHLORIDE 0.9 % IV SOLN
Freq: Once | INTRAVENOUS | Status: AC
  Administered 2019-09-24: 11:00:00 via INTRAVENOUS
  Filled 2019-09-24: qty 5

## 2019-09-24 MED ORDER — SODIUM CHLORIDE 0.9 % IV SOLN
Freq: Once | INTRAVENOUS | Status: AC
  Administered 2019-09-24: 11:00:00 via INTRAVENOUS
  Filled 2019-09-24: qty 250

## 2019-09-24 NOTE — Progress Notes (Addendum)
[place]  [phone] [phone]     ID: Veronica [name] DOB: 1970/12/19  MR#: [id number]  QMG#:[id number]  Patient Care Team: Blair Heys, Hershal Coria as PCP - General (Physician Assistant) Mauro Kaufmann, RN as Oncology Nurse Navigator [name], RN as Oncology Nurse Navigator Erroll Luna, MD as Consulting Physician (General Surgery) Magrinat, Virgie Dad, MD as Consulting Physician (Oncology) Kyung Rudd, MD as Consulting Physician (Radiation Oncology) Scot Dock, NP OTHER MD:  CHIEF COMPLAINT: Estrogen receptor positive breast cancer  CURRENT TREATMENT:  adjuvant chemotherapy   INTERVAL HISTORY: Veronica [name] returns today for follow-up and treatment of her estrogen receptor positive breast cancer.   She is receiving adjuvant chemotherapy consisting of doxorubicin and cyclophosphamide in dose dense fashion x4, to be followed by weekly paclitaxel x12.  Today is day 1 cycle 4 doxorubicin and cyclophosphamide, with Onpro support.    Over the past few weeks Veronica [name] has had a couple of ER visits for fluid overload.  She was diuresed.  Due to having a h/o elevated BNP and continued difficulty with facial/periorbital edema, we held her chemo last week and repeated an echocardiogram on 09/19/19 that showed a normal EF of 60-65%.    REVIEW OF SYSTEMS: Veronica [name] is doing well.  She has developed some erythema on her inner right lower eyelid.  She notes this started over the weekend.  She has been feeling well otherwise.  She has remained active.  The skin on her face is dry and she wonders what she might put on it to give it some relief.  She denies any new issues such as fever, chills, chest pain, palpitations, cough, nausea, vomiting, bowel/bladder changes, headaches, vision changes or any other concerns.  A detailed ROS was otherwise non contributory.    HISTORY OF CURRENT ILLNESS: From the original intake note:  ELYN KROGH had routine screening  mammography on 04/27/2019 showing a possible abnormality in the right breast. She underwent right diagnostic mammography with tomography and right breast ultrasonography at [facility] on 05/01/2019 showing: breast density category C; highly suspicious 1.7 cm irregular mass in the right breast at 10 o'clock, 8 cm from the nipple; indeterminate hypoechoic round mass in the right axillary tail/low axilla. Physical exam showed no suspicious lumps.  Accordingly on 05/02/2019 she proceeded to biopsy of the right breast area in question. The pathology from this procedure ([id number]) showed: invasive ductal carcinoma, grade 3; ductal carcinoma in situ; lymphovascular invasion present. Prognostic indicators significant for: estrogen receptor, 30% positive with moderate staining intensity, and progesterone receptor, 30% positive with strong staining intensity. Proliferation marker Ki67 at 20%. HER2 negative by immunohistochemistry (1+).  The second "satellite" lesion was a lymph node which was also positive.  The patient's subsequent history is as detailed below.   PAST MEDICAL HISTORY: Past Medical History:  Diagnosis Date  . Cancer ([place])   . Chronic low back pain with left-sided sciatica   . Family history of leukemia   . Family history of stomach cancer   . Hypertension     PAST SURGICAL HISTORY: Past Surgical History:  Procedure Laterality Date  . BREAST BIOPSY Right 05/02/2019   right clips X2  . BREAST LUMPECTOMY WITH RADIOACTIVE SEED AND SENTINEL LYMPH NODE BIOPSY Right 06/12/2019   Procedure: RIGHT BREAST RADIOACTIVE SEED LUMPECTOMY  AND RIGHT AXILLARY SEED TARGETED LYMPH NODE AND AXILLARY SENTINEL LYMPH NODE MAPPING;  Surgeon: Erroll Luna, MD;  Location: [facility];  Service: General;  Laterality:  Right;  PEC BLOCK  . C/S x2  '98 '03  . CESAREAN SECTION    . CHOLECYSTECTOMY    . LAPAROSCOPIC ASSISTED VAGINAL HYSTERECTOMY  05/17/2011   Procedure: LAPAROSCOPIC ASSISTED  VAGINAL HYSTERECTOMY;  Surgeon: Sharene Butters;  Location: [facility];  Service: Gynecology;  Laterality: N/A;  Laparoscopic Assisted Vaginal Hysterectomy With Lysis Of Adhesions  . PORTACATH PLACEMENT Right 06/12/2019   Procedure: INSERTION PORT-A-CATH WITH ULTRASOUND;  Surgeon: Erroll Luna, MD;  Location: [facility];  Service: General;  Laterality: Right;  . RE-EXCISION OF BREAST LUMPECTOMY Right 07/17/2019   Procedure: RE-EXCISION OF RIGHT BREAST LUMPECTOMY;  Surgeon: Erroll Luna, MD;  Location: [facility];  Service: General;  Laterality: Right;  . TUBAL LIGATION      FAMILY HISTORY: Family History  Problem Relation Age of Onset  . Arthritis Mother   . COPD Mother   . Hypertension Mother   . Hyperlipidemia Mother   . Leukemia Brother 30  . Stomach cancer Maternal Grandmother        late 40s   Patient's parents are living (as of September 2020). Her father is 39 and her mother is 20 as of 04/2019. The patient denies a family hx of breast or ovarian cancer. She has 7 siblings, 6 brothers and 1 sister. She reports her maternal grandmother was diagnosed with stomach cancer at age 74. Her brother was diagnosed with leukemia at age 24.   GYNECOLOGIC HISTORY:  No LMP recorded. Patient has had a hysterectomy. Menarche: 48 years old Age at first live birth: 48 years old GX P 2 (+1 stillborn) LMP age 41-42 Contraceptive: unsure, maybe for a year at age 14. HRT no Hysterectomy? Yes, 05/2011 BSO? 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 She is married. Husband Sheppard Plumber") is a Hydrologist for a telephone company. She lives at home with her husband and two daughters. Daughter Suszanne Conners, age 72, is a Scientist, water quality. Daughter Jarrett Soho, age 52, is a Ship broker.     ADVANCED DIRECTIVES: Husband Heath Lark is her HCPOA.   HEALTH MAINTENANCE: Social History   Tobacco Use  . Smoking status: Never Smoker  . Smokeless  tobacco: Never Used  Substance Use Topics  . Alcohol use: Yes    Alcohol/week: 2.0 standard drinks    Types: 2 Standard drinks or equivalent per week    Comment: "2-3 a week"  . Drug use: No     Colonoscopy: n/a  PAP: 02/2018  Bone density: n/a   No Known Allergies  Current Outpatient Medications  Medication Sig Dispense Refill  . acetaminophen (TYLENOL) 500 MG tablet Take 500 mg by mouth every 6 (six) hours as needed for mild pain.     . Cholecalciferol (VITAMIN D3) 25 MCG (1000 UT) CHEW Chew 1 tablet by mouth daily.     Marland Kitchen dexamethasone (DECADRON) 4 MG tablet TAKE TWO TABLETS BY MOUTH ONCE A DAY ON THE DAY AFTER CHEMOTHERAPY AND THEN TAKE TWO TABLETS BY MOUTH TWICE DAILY FOR 2 DAYS 30 tablet 1  . famotidine (PEPCID) 10 MG tablet Take 10 mg by mouth 2 (two) times daily.    . furosemide (LASIX) 20 MG tablet Take 1 tablet (20 mg total) by mouth daily for 3 days. 3 tablet 0  . lidocaine-prilocaine (EMLA) cream Apply to affected area once 30 g 3  . loratadine (CLARITIN) 10 MG tablet Take 10 mg by mouth See admin instructions. Takes for 5 days after chemo    .  LORazepam (ATIVAN) 0.5 MG tablet Take 1 tablet (0.5 mg total) by mouth at bedtime as needed (Nausea or vomiting). 30 tablet 0  . olmesartan-hydrochlorothiazide (BENICAR HCT) 20-12.5 MG tablet Take 1 tablet by mouth daily.    . ondansetron (ZOFRAN) 8 MG tablet Take 1 tablet (8 mg total) by mouth every 8 (eight) hours as needed for nausea or vomiting. 20 tablet 0  . potassium chloride SA (KLOR-CON) 20 MEQ tablet Take 2 tablets (40 mEq total) by mouth 2 (two) times daily for 3 days. 12 tablet 0  . prochlorperazine (COMPAZINE) 10 MG tablet Take 1 tablet (10 mg total) by mouth every 6 (six) hours as needed (Nausea or vomiting). 30 tablet 1   No current facility-administered medications for this visit.    OBJECTIVE:   Vitals:   09/24/19 0911  BP: 116/74  Pulse: 94  Resp: 18  Temp: 98.9 F (37.2 C)  SpO2: 100%   Wt Readings from  Last 3 Encounters:  09/24/19 215 lb 3.2 oz (97.6 kg)  09/18/19 215 lb 12.8 oz (97.9 kg)  09/05/19 221 lb 4.8 oz (100.4 kg)   Body mass index is 40.66 kg/m.    ECOG FS:1 - Symptomatic but completely ambulatory GENERAL: Patient is a well appearing female in no acute distress HEENT:  Sclerae anicteric.  Mask in Place. Neck is supple.  NODES:  No cervical, supraclavicular, or axillary lymphadenopathy palpated.  BREAST EXAM:  Deferred. LUNGS:  Clear to auscultation bilaterally.  No wheezes or rhonchi. HEART:  Regular rate and rhythm. No murmur appreciated. ABDOMEN:  Soft, nontender.  Positive, normoactive bowel sounds. No organomegaly palpated. MSK:  No focal spinal tenderness to palpation. Full range of motion bilaterally in the upper extremities. EXTREMITIES:  No peripheral edema.   SKIN:  Clear with no obvious rashes or skin changes. No nail dyscrasia. NEURO:  Nonfocal. Well oriented.  Appropriate affect.     LAB RESULTS:  CMP     Component Value Date/Time   NA 138 09/24/2019 1005   K 3.6 09/24/2019 1005   CL 106 09/24/2019 1005   CO2 22 09/24/2019 1005   GLUCOSE 127 (H) 09/24/2019 1005   BUN 7 09/24/2019 1005   CREATININE 0.64 09/24/2019 1005   CREATININE 0.71 09/05/2019 1030   CALCIUM 9.1 09/24/2019 1005   PROT 6.4 (L) 09/24/2019 1005   ALBUMIN 3.7 09/24/2019 1005   AST 28 09/24/2019 1005   AST 18 05/09/2019 1225   ALT 34 09/24/2019 1005   ALT 23 05/09/2019 1225   ALKPHOS 69 09/24/2019 1005   BILITOT 0.9 09/24/2019 1005   BILITOT 0.7 05/09/2019 1225   GFRNONAA >60 09/24/2019 1005   GFRNONAA >60 09/05/2019 1030   GFRAA >60 09/24/2019 1005   GFRAA >60 09/05/2019 1030    No results found for: TOTALPROTELP, ALBUMINELP, A1GS, A2GS, BETS, BETA2SER, GAMS, MSPIKE, SPEI  No results found for: KPAFRELGTCHN, LAMBDASER, KAPLAMBRATIO  Lab Results  Component Value Date   WBC 6.5 09/24/2019   NEUTROABS 4.9 09/24/2019   HGB 11.0 (L) 09/24/2019   HCT 33.6 (L) 09/24/2019     MCV 91.3 09/24/2019   PLT 178 09/24/2019    _0 @  No results found for: LABCA2  No components found for: [id number]  No results for input(s): INR in the last 168 hours.  No results found for: LABCA2  No results found for: DGU440  No results found for: HKV425  No results found for: ZDG387  No results found for: CA2729  No components found for:  HGQUANT  No results found for: CEA1 / No results found for: CEA1   No results found for: AFPTUMOR  No results found for: [place]  No results found for: PSA1  Appointment on 09/24/2019  Component Date Value Ref Range Status  . Sodium 09/24/2019 138  135 - 145 mmol/L Final  . Potassium 09/24/2019 3.6  3.5 - 5.1 mmol/L Final  . Chloride 09/24/2019 106  98 - 111 mmol/L Final  . CO2 09/24/2019 22  22 - 32 mmol/L Final  . Glucose, Bld 09/24/2019 127* 70 - 99 mg/dL Final  . BUN 09/24/2019 7  6 - 20 mg/dL Final  . Creatinine, Ser 09/24/2019 0.64  0.44 - 1.00 mg/dL Final  . Calcium 09/24/2019 9.1  8.9 - 10.3 mg/dL Final  . Total Protein 09/24/2019 6.4* 6.5 - 8.1 g/dL Final  . Albumin 09/24/2019 3.7  3.5 - 5.0 g/dL Final  . AST 09/24/2019 28  15 - 41 U/L Final  . ALT 09/24/2019 34  0 - 44 U/L Final  . Alkaline Phosphatase 09/24/2019 69  38 - 126 U/L Final  . Total Bilirubin 09/24/2019 0.9  0.3 - 1.2 mg/dL Final  . GFR calc non Af Amer 09/24/2019 >60  >60 mL/min Final  . GFR calc Af Amer 09/24/2019 >60  >60 mL/min Final  . Anion gap 09/24/2019 10  5 - 15 Final   Performed at [facility], [place] [address] [place], [place] [address]  . WBC 09/24/2019 6.5  4.0 - 10.5 K/uL Final  . RBC 09/24/2019 3.68* 3.87 - 5.11 MIL/uL Final  . Hemoglobin 09/24/2019 11.0* 12.0 - 15.0 g/dL Final  . HCT 09/24/2019 33.6* 36.0 - 46.0 % Final  . MCV 09/24/2019 91.3  80.0 - 100.0 fL Final  . MCH 09/24/2019 29.9  26.0 - 34.0 pg Final  . MCHC 09/24/2019 32.7  30.0 - 36.0 g/dL Final  . RDW 09/24/2019 15.9* 11.5 - 15.5  % Final  . Platelets 09/24/2019 178  150 - 400 K/uL Final  . nRBC 09/24/2019 0.0  0.0 - 0.2 % Final  . Neutrophils Relative % 09/24/2019 74  % Final  . Neutro Abs 09/24/2019 4.9  1.7 - 7.7 K/uL Final  . Lymphocytes Relative 09/24/2019 12  % Final  . Lymphs Abs 09/24/2019 0.8  0.7 - 4.0 K/uL Final  . Monocytes Relative 09/24/2019 10  % Final  . Monocytes Absolute 09/24/2019 0.6  0.1 - 1.0 K/uL Final  . Eosinophils Relative 09/24/2019 3  % Final  . Eosinophils Absolute 09/24/2019 0.2  0.0 - 0.5 K/uL Final  . Basophils Relative 09/24/2019 0  % Final  . Basophils Absolute 09/24/2019 0.0  0.0 - 0.1 K/uL Final  . Immature Granulocytes 09/24/2019 1  % Final  . Abs Immature Granulocytes 09/24/2019 0.04  0.00 - 0.07 K/uL Final   Performed at [facility], [place] [address] [place], [place] [address]    (this displays the last labs from the last 3 days)  No results found for: TOTALPROTELP, ALBUMINELP, A1GS, A2GS, BETS, BETA2SER, GAMS, MSPIKE, SPEI (this displays SPEP labs)  No results found for: KPAFRELGTCHN, LAMBDASER, KAPLAMBRATIO (kappa/lambda light chains)  No results found for: HGBA, HGBA2QUANT, HGBFQUANT, HGBSQUAN (Hemoglobinopathy evaluation)   No results found for: LDH  No results found for: IRON, TIBC, IRONPCTSAT (Iron and TIBC)  No results found for: FERRITIN  Urinalysis    Component Value Date/Time   COLORURINE YELLOW 08/28/2019 2326   APPEARANCEUR CLOUDY (A) 08/28/2019  2326   LABSPEC 1.013 08/28/2019 2326   PHURINE 5.0 08/28/2019 2326   GLUCOSEU NEGATIVE 08/28/2019 2326   HGBUR MODERATE (A) 08/28/2019 2326   BILIRUBINUR NEGATIVE 08/28/2019 2326   KETONESUR NEGATIVE 08/28/2019 2326   PROTEINUR NEGATIVE 08/28/2019 2326   NITRITE NEGATIVE 08/28/2019 2326   LEUKOCYTESUR MODERATE (A) 08/28/2019 2326     STUDIES: DG Chest 1 View  Result Date: 08/30/2019 CLINICAL DATA:  Cough.  History of breast cancer. EXAM: CHEST  1 VIEW COMPARISON:   08/28/2019 FINDINGS: Stable position of the right jugular Port-A-Cath with the tip in the lower SVC. Densities in the right lower chest appear to be related to overlying breast tissue. No focal airspace disease. No evidence for pulmonary edema. Heart and mediastinum are within normal limits. Trachea is midline. IMPRESSION: No acute cardiopulmonary disease. Electronically Signed   By: Markus Daft M.D.   On: 08/30/2019 09:42   DG Chest 2 View  Result Date: 09/01/2019 CLINICAL DATA:  Chest pain EXAM: CHEST - 2 VIEW COMPARISON:  August 30, 2019 FINDINGS: There is a well-positioned right-sided Port-A-Cath. There are small bilateral pleural effusions, right greater than left. There is an airspace opacity at the right lung base with new perihilar linear airspace opacities on the right. There is no pneumothorax. No large pleural effusion. No acute osseous abnormality. IMPRESSION: 1. Small bilateral pleural effusions, increased in size from prior study. 2. Developing opacity at the right lung base of unknown clinical significance. This could represent atelectasis or an infiltrate. A mass is not excluded. Follow-up to radiologic resolution is recommended. 3. Bibasilar airspace opacities favored to represent atelectasis with an infiltrate not excluded. Electronically Signed   By: Constance Holster M.D.   On: 09/01/2019 18:40   DG Chest Port 1 View  Result Date: 08/28/2019 CLINICAL DATA:  Cough and fever. Active chemotherapy for breast cancer. EXAM: PORTABLE CHEST 1 VIEW COMPARISON:  Radiograph 06/12/2019 FINDINGS: Right chest port remains in place. Unchanged heart size and mediastinal contours. No focal airspace disease, pleural effusion, or pneumothorax. No acute osseous abnormalities are seen. IMPRESSION: No acute chest findings. Electronically Signed   By: Keith Rake M.D.   On: 08/28/2019 23:43   ECHOCARDIOGRAM COMPLETE  Result Date: 09/19/2019   ECHOCARDIOGRAM REPORT   Patient Name:   Veronica [name]  Date of Exam: 09/19/2019 Medical Rec #:  [id number]      Height:       61.0 in Accession #:    [id number]     Weight:       215.8 lb Date of Birth:  December 24, 1970      BSA:          1.95 m Patient Age:    48 years       BP:           114/62 mmHg Patient Gender: F              HR:           102 bpm. Exam Location:  Outpatient Procedure: 2D Echo, Cardiac Doppler and Color Doppler Indications:    Chemo evaluation  History:        Patient has prior history of Echocardiogram examinations, most                 recent 06/26/2019. Breast cancer, chemo.  Sonographer:    Dustin Flock Referring Phys: [place]  1. Left ventricular ejection fraction, by visual estimation, is 60 to 65%. The left  ventricle has normal function. There is no left ventricular hypertrophy.  2. Left ventricular diastolic function could not be evaluated.  3. The left ventricle has no regional wall motion abnormalities.  4. Global right ventricle has normal systolic function.The right ventricular size is normal. No increase in right ventricular wall thickness.  5. Left atrial size was normal.  6. Right atrial size was normal.  7. The mitral valve is normal in structure. No evidence of mitral valve regurgitation. No evidence of mitral stenosis.  8. The tricuspid valve is normal in structure. Tricuspid valve regurgitation is trivial.  9. The aortic valve is normal in structure. Aortic valve regurgitation is not visualized. No evidence of aortic valve sclerosis or stenosis. 10. The pulmonic valve was normal in structure. Pulmonic valve regurgitation is not visualized. 11. Normal pulmonary artery systolic pressure. 12. The inferior vena cava is normal in size with greater than 50% respiratory variability, suggesting right atrial pressure of 3 mmHg. 13. The average left ventricular global longitudinal strain is -13.5 %. In comparison to the previous echocardiogram(s): LV longitudinal strain is mildly abnormal and has decreased  since September 2020. FINDINGS  Left Ventricle: Left ventricular ejection fraction, by visual estimation, is 60 to 65%. The left ventricle has normal function. The average left ventricular global longitudinal strain is -13.5 %. The left ventricle has no regional wall motion abnormalities. There is no left ventricular hypertrophy. Left ventricular diastolic function could not be evaluated. Normal left atrial pressure. Right Ventricle: The right ventricular size is normal. No increase in right ventricular wall thickness. Global RV systolic function is has normal systolic function. The tricuspid regurgitant velocity is 2.13 m/s, and with an assumed right atrial pressure  of 3 mmHg, the estimated right ventricular systolic pressure is normal at 21.1 mmHg. Left Atrium: Left atrial size was normal in size. Right Atrium: Right atrial size was normal in size Pericardium: There is no evidence of pericardial effusion. Mitral Valve: The mitral valve is normal in structure. No evidence of mitral valve regurgitation. No evidence of mitral valve stenosis by observation. Tricuspid Valve: The tricuspid valve is normal in structure. Tricuspid valve regurgitation is trivial. Aortic Valve: The aortic valve is normal in structure. Aortic valve regurgitation is not visualized. The aortic valve is structurally normal, with no evidence of sclerosis or stenosis. Pulmonic Valve: The pulmonic valve was normal in structure. Pulmonic valve regurgitation is not visualized. Pulmonic regurgitation is not visualized. Aorta: The aortic root, ascending aorta and aortic arch are all structurally normal, with no evidence of dilitation or obstruction. Venous: The inferior vena cava is normal in size with greater than 50% respiratory variability, suggesting right atrial pressure of 3 mmHg. IAS/Shunts: No atrial level shunt detected by color flow Doppler. There is no evidence of a patent foramen ovale. No ventricular septal defect is seen or detected.  There is no evidence of an atrial septal defect.  LEFT VENTRICLE PLAX 2D LVIDd:         3.50 cm  Diastology LVIDs:         2.60 cm  LV e' lateral: 9.03 cm/s LV PW:         1.10 cm  LV e' medial:  7.40 cm/s LV IVS:        1.00 cm LVOT diam:     1.90 cm  2D Longitudinal Strain LV SV:         26 ml    2D Strain GLS Avg:     -13.5 % LV SV  Index:   12.46 LVOT Area:     2.84 cm  RIGHT VENTRICLE RV Basal diam:  2.10 cm RV S prime:     16.50 cm/s TAPSE (M-mode): 2.8 cm LEFT ATRIUM             Index       RIGHT ATRIUM          Index LA diam:        3.20 cm 1.64 cm/m  RA Area:     6.40 cm LA Vol (A2C):   19.7 ml 10.10 ml/m RA Volume:   8.50 ml  4.36 ml/m LA Vol (A4C):   31.6 ml 16.20 ml/m LA Biplane Vol: 25.4 ml 13.02 ml/m  AORTIC VALVE LVOT Vmax:   107.00 cm/s LVOT Vmean:  71.600 cm/s LVOT VTI:    0.173 m  AORTA Ao Root diam: 2.50 cm TRICUSPID VALVE TR Peak grad:   18.1 mmHg TR Vmax:        228.00 cm/s  SHUNTS Systemic VTI:  0.17 m Systemic Diam: 1.90 cm  Dani Gobble Croitoru MD Electronically signed by Sanda Klein MD Signature Date/Time: 09/19/2019/4:16:18 PM    Final     ELIGIBLE FOR AVAILABLE RESEARCH PROTOCOL: no  ASSESSMENT: 48 y.o. [place], [place] woman status post right breast upper outer quadrant biopsy 05/01/2019 for a clinical T1c N1, stage IIA invasive ductal carcinoma, grade 3, estrogen and progesterone receptor positive, HER-2 nonamplified, with an MIB-1-1 of 20%.  (a) mass in the axillary tail was a positive lymph node  (1) MammaPrint obtained from the original biopsy shows a high risk luminal subtype B tumor  (2) genetics testing 05/09/2019 through the Common Hereditary Gene Panel offered by [facility] found no deleterious mutations in APC, ATM, AXIN2, BARD1, BMPR1A, BRCA1, BRCA2, BRIP1, CDH1, CDK4, CDKN2A (p14ARF), CDKN2A (p16INK4a), CHEK2, CTNNA1, DICER1, EPCAM (Deletion/duplication testing only), GREM1 (promoter region deletion/duplication testing only), KIT, MEN1, MLH1, MSH2, MSH3, MSH6, MUTYH,  NBN, NF1, NHTL1, PALB2, PDGFRA, PMS2, POLD1, POLE, PTEN, RAD50, RAD51C, RAD51D, RNF43, SDHB, SDHC, SDHD, SMAD4, SMARCA4. STK11, TP53, TSC1, TSC2, and VHL.  The following genes were evaluated for sequence changes only: SDHA and HOXB13 c.251G>A variant only.   (a) A variant of uncertain significance (VUS) was detected in one of her MSH6 genes (c.831A>C).  (3) status post right lumpectomy and sentinel lymph node sampling 06/12/2019 for a pT2 pN1, stage IIA invasive ductal carcinoma, grade 2, with positive margins  (a) a total of 4 sentinel lymph nodes removed, one positive (with ECE), ine itc  (b) margin clearance 04/19/2019 successful medial margin close but negative for DCIS  (4) adjuvant chemotherapy will consist of doxorubicin and cyclophosphamide in dose dense fashion x4 starting 07/10/2019 followed by weekly paclitaxel x12  (a) echo 06/26/2019 shows an EF of 60-65%  (b echo on 09/19/2019 shows an EF of 60-65%  (5) adjuvant radiation to follow  (5) antiestrogens to start at the completion of local treatment  PLAN:  Jessalyn is doing better today.  I reviewed with her that her repeat echocardiogram was normal, and her HCG was negative.  She met with Dr. Jana Hakim who reviewed the history with her in detail.  She is going to proceed with treatment today.  The doxorubicin will be dose reduced by 20%.  She will return in one week for labs and f/u.  In two weeks she will start weekly Paclitaxel.  She would like to receive this on Tuesdays.  Jaylani and I reviewed how this schedule would go.  We will see her back next week for labs and f/u.  She was recommended to continue with the appropriate pandemic precautions. She knows to call for any other issues that may develop before the next visit.    Wilber Bihari, NP Medical Oncology and Hematology [facility] [place], [place] [address] Tel. [phone]    Fax. [phone]   ADDENDUM: Sitlaly's episode of fluid overload  remains somewhat puzzling since she does not have evidence of heart failure or impaired diastolic relaxation.  At any rate that is greatly improved.  And I think we can proceed to the last cycle of doxorubicin and cyclophosphamide, though we are going to drop the dose (in addition to having increased the treatment interval).  I do not anticipate any complications from this.  The good news is that she has completed the more intensive portion of her treatment.  Hopefully she will coast through the paclitaxel therapies without any complications.  We did discuss some of the possible toxicities side effects and complications of this agent and particularly concerns regarding peripheral neuropathy.  Given the concerns with her chest x-ray it may be prudent to obtain a CT scan of the chest just to make sure everything is cleared and that is scheduled for next week  She knows to call for any other issues that may develop before the next visit.  I personally saw this patient and performed a substantive portion of this encounter with the listed APP documented above.   Chauncey Cruel, MD Medical Oncology and Hematology [facility] [address] [place], [place] [address] Tel. [phone]    Fax. [phone]

## 2019-09-24 NOTE — Patient Instructions (Signed)
[facility] Discharge Instructions for Patients Receiving Chemotherapy  Today you received the following chemotherapy agents: adriamycin, Cytoxan  To help prevent nausea and vomiting after your treatment, we encourage you to take your nausea medication as directed.   If you develop nausea and vomiting that is not controlled by your nausea medication, call the clinic.   BELOW ARE SYMPTOMS THAT SHOULD BE REPORTED IMMEDIATELY:  *FEVER GREATER THAN 100.5 F  *CHILLS WITH OR WITHOUT FEVER  NAUSEA AND VOMITING THAT IS NOT CONTROLLED WITH YOUR NAUSEA MEDICATION  *UNUSUAL SHORTNESS OF BREATH  *UNUSUAL BRUISING OR BLEEDING  TENDERNESS IN MOUTH AND THROAT WITH OR WITHOUT PRESENCE OF ULCERS  *URINARY PROBLEMS  *BOWEL PROBLEMS  UNUSUAL RASH Items with * indicate a potential emergency and should be followed up as soon as possible.  Feel free to call the clinic should you have any questions or concerns. The clinic phone number is [phone].  Please show the [facility] at check-in to the Emergency Department and triage nurse.

## 2019-09-24 NOTE — Patient Instructions (Signed)

## 2019-09-25 ENCOUNTER — Other Ambulatory Visit: Payer: Self-pay | Admitting: Adult Health

## 2019-09-25 ENCOUNTER — Other Ambulatory Visit: Payer: 59

## 2019-09-25 ENCOUNTER — Ambulatory Visit: Payer: 59

## 2019-09-25 ENCOUNTER — Ambulatory Visit: Payer: 59 | Admitting: Oncology

## 2019-09-25 DIAGNOSIS — R05 Cough: Secondary | ICD-10-CM

## 2019-09-25 DIAGNOSIS — R059 Cough, unspecified: Secondary | ICD-10-CM

## 2019-09-25 DIAGNOSIS — C50411 Malignant neoplasm of upper-outer quadrant of right female breast: Secondary | ICD-10-CM

## 2019-09-25 DIAGNOSIS — Z170 Estrogen receptor positive status [ER+]: Secondary | ICD-10-CM

## 2019-09-25 NOTE — Progress Notes (Signed)
Called patient and reviewed chest xray results which show right lung opacity indeterminate.  I reviewed with patient that we need to do chest CT to evaluate for infection/inflammation and less likely neoplasm.  She understands.  Orders placed for CT chest w/contrast.    Wilber Bihari, NP

## 2019-09-26 ENCOUNTER — Telehealth: Payer: Self-pay | Admitting: *Deleted

## 2019-09-26 NOTE — Telephone Encounter (Signed)
This RN spoke with pt per her call stating she " feels like I am building up that fluid again".  Veronica [name] states she feels like her face is swollen with some irritation in her throat. She denies any issues with swallow " just feel swollen "  She states her face is a little discolored " red " and if I bend over for a while it looks like it turns a little purple"   She states her blood sugar is high at 297 today.  She states " they had to use a lot of extra saline on me when they accessed my port because they couldn't get a blood return "  This RN discussed above may be related to use of steroids and increased saline use.  This RN also noted pt sounded short of breath- but she states she is ok - " just with laying down some but then I sit up and it goes away."  Veronica [name] states she feels a little like when she was in the hospital when " I had too much fluid ".  Noted pt has lasix on medication list - inquired if she is taking - she states she only used for the 3 days as recommended previously. She does have lasix in the home.  Per review with [facility]/NP pt is is weigh herself now- document and then take a lasix.  She is recheck her weight in the am and  to call with status update.  This RN informed pt who states she weighed herself today " and I was 5 lbs heavier since Monday because I weighed myself in my nightgown only at home "  She states her weight this am in wearing her nightgown only was 216.  She will take a lasix now.  She will call this office in the morning.  No further needs at this time.

## 2019-09-27 ENCOUNTER — Telehealth: Payer: Self-pay

## 2019-09-27 NOTE — Telephone Encounter (Signed)
Spoke with pt by phone and gave below message regarding taking 1 more dose of lasix along with a dose of potassium. Pt verbalizes understanding and will call tomorrow to report how she is doing.

## 2019-09-27 NOTE — Telephone Encounter (Signed)
Spoke with pt by phone regarding her concern over swelling. Pt states she took 1 dose of lasix yesterday (she has not taken any today).  She weighed this morning in her night gown as usual and has lost 2 lbs. Pt states she feels swelling is still present in her face and abdomen.  No report of swelling in extremities. No SHOB. Pt wants to know if she should continue to take lasix and should she also take her potassium supplement with the lasix.

## 2019-09-27 NOTE — Telephone Encounter (Signed)
I would recommend taking one 20mg  lasix tablet today and one 82meq potassium pill and then stop.  When is her CT chest scheduled?  Was it able to be moved up?

## 2019-09-27 NOTE — Telephone Encounter (Signed)
Ct scheduled for 12/21

## 2019-09-28 ENCOUNTER — Other Ambulatory Visit: Payer: Self-pay | Admitting: Oncology

## 2019-09-28 ENCOUNTER — Encounter (HOSPITAL_COMMUNITY): Payer: Self-pay

## 2019-09-28 ENCOUNTER — Other Ambulatory Visit: Payer: Self-pay

## 2019-09-28 ENCOUNTER — Inpatient Hospital Stay (HOSPITAL_COMMUNITY)
Admission: EM | Admit: 2019-09-28 | Discharge: 2019-10-19 | DRG: 163 | Disposition: A | Discharge status: Home Health | Payer: 59 | Attending: Internal Medicine | Admitting: Internal Medicine

## 2019-09-28 ENCOUNTER — Inpatient Hospital Stay (HOSPITAL_BASED_OUTPATIENT_CLINIC_OR_DEPARTMENT_OTHER): Payer: 59 | Admitting: Medical

## 2019-09-28 ENCOUNTER — Emergency Department (HOSPITAL_COMMUNITY): Payer: 59

## 2019-09-28 ENCOUNTER — Inpatient Hospital Stay: Payer: 59

## 2019-09-28 ENCOUNTER — Telehealth: Payer: Self-pay

## 2019-09-28 ENCOUNTER — Ambulatory Visit (HOSPITAL_COMMUNITY)
Admission: RE | Admit: 2019-09-28 | Discharge: 2019-09-28 | Disposition: A | Discharge status: Home / Self Care | Payer: 59 | Source: Ambulatory Visit | Attending: Medical | Admitting: Medical

## 2019-09-28 VITALS — BP 100/68 | HR 100 | Temp 98.3°F | Resp 19 | Ht 61.0 in | Wt 217.5 lb

## 2019-09-28 DIAGNOSIS — Z95828 Presence of other vascular implants and grafts: Secondary | ICD-10-CM

## 2019-09-28 DIAGNOSIS — D4860 Neoplasm of uncertain behavior of unspecified breast: Secondary | ICD-10-CM | POA: Diagnosis not present

## 2019-09-28 DIAGNOSIS — T451X5A Adverse effect of antineoplastic and immunosuppressive drugs, initial encounter: Secondary | ICD-10-CM | POA: Diagnosis present

## 2019-09-28 DIAGNOSIS — I871 Compression of vein: Secondary | ICD-10-CM | POA: Diagnosis present

## 2019-09-28 DIAGNOSIS — R310 Gross hematuria: Secondary | ICD-10-CM | POA: Diagnosis not present

## 2019-09-28 DIAGNOSIS — J9811 Atelectasis: Secondary | ICD-10-CM | POA: Diagnosis present

## 2019-09-28 DIAGNOSIS — T783XXA Angioneurotic edema, initial encounter: Secondary | ICD-10-CM | POA: Insufficient documentation

## 2019-09-28 DIAGNOSIS — K219 Gastro-esophageal reflux disease without esophagitis: Secondary | ICD-10-CM | POA: Diagnosis present

## 2019-09-28 DIAGNOSIS — I82622 Acute embolism and thrombosis of deep veins of left upper extremity: Secondary | ICD-10-CM | POA: Diagnosis present

## 2019-09-28 DIAGNOSIS — I82210 Acute embolism and thrombosis of superior vena cava: Secondary | ICD-10-CM | POA: Diagnosis not present

## 2019-09-28 DIAGNOSIS — R0602 Shortness of breath: Secondary | ICD-10-CM

## 2019-09-28 DIAGNOSIS — D696 Thrombocytopenia, unspecified: Secondary | ICD-10-CM | POA: Diagnosis not present

## 2019-09-28 DIAGNOSIS — I82411 Acute embolism and thrombosis of right femoral vein: Secondary | ICD-10-CM | POA: Diagnosis present

## 2019-09-28 DIAGNOSIS — I82612 Acute embolism and thrombosis of superficial veins of left upper extremity: Secondary | ICD-10-CM | POA: Diagnosis present

## 2019-09-28 DIAGNOSIS — C50411 Malignant neoplasm of upper-outer quadrant of right female breast: Secondary | ICD-10-CM | POA: Diagnosis present

## 2019-09-28 DIAGNOSIS — Z170 Estrogen receptor positive status [ER+]: Secondary | ICD-10-CM

## 2019-09-28 DIAGNOSIS — D61810 Antineoplastic chemotherapy induced pancytopenia: Secondary | ICD-10-CM | POA: Diagnosis present

## 2019-09-28 DIAGNOSIS — I314 Cardiac tamponade: Secondary | ICD-10-CM | POA: Diagnosis not present

## 2019-09-28 DIAGNOSIS — J95821 Acute postprocedural respiratory failure: Secondary | ICD-10-CM | POA: Diagnosis not present

## 2019-09-28 DIAGNOSIS — R220 Localized swelling, mass and lump, head: Secondary | ICD-10-CM

## 2019-09-28 DIAGNOSIS — I2699 Other pulmonary embolism without acute cor pulmonale: Secondary | ICD-10-CM | POA: Diagnosis not present

## 2019-09-28 DIAGNOSIS — R791 Abnormal coagulation profile: Secondary | ICD-10-CM | POA: Diagnosis present

## 2019-09-28 DIAGNOSIS — T8131XA Disruption of external operation (surgical) wound, not elsewhere classified, initial encounter: Secondary | ICD-10-CM | POA: Diagnosis present

## 2019-09-28 DIAGNOSIS — I824Y1 Acute embolism and thrombosis of unspecified deep veins of right proximal lower extremity: Secondary | ICD-10-CM | POA: Diagnosis present

## 2019-09-28 DIAGNOSIS — M5442 Lumbago with sciatica, left side: Secondary | ICD-10-CM | POA: Diagnosis present

## 2019-09-28 DIAGNOSIS — U071 COVID-19: Secondary | ICD-10-CM | POA: Diagnosis not present

## 2019-09-28 DIAGNOSIS — J9600 Acute respiratory failure, unspecified whether with hypoxia or hypercapnia: Secondary | ICD-10-CM | POA: Diagnosis not present

## 2019-09-28 DIAGNOSIS — L03313 Cellulitis of chest wall: Secondary | ICD-10-CM | POA: Diagnosis not present

## 2019-09-28 DIAGNOSIS — I2694 Multiple subsegmental pulmonary emboli without acute cor pulmonale: Secondary | ICD-10-CM | POA: Diagnosis not present

## 2019-09-28 DIAGNOSIS — R000 Tachycardia, unspecified: Secondary | ICD-10-CM | POA: Diagnosis not present

## 2019-09-28 DIAGNOSIS — I82431 Acute embolism and thrombosis of right popliteal vein: Secondary | ICD-10-CM | POA: Diagnosis present

## 2019-09-28 DIAGNOSIS — Z6841 Body Mass Index (BMI) 40.0 and over, adult: Secondary | ICD-10-CM | POA: Diagnosis not present

## 2019-09-28 DIAGNOSIS — I10 Essential (primary) hypertension: Secondary | ICD-10-CM | POA: Diagnosis not present

## 2019-09-28 DIAGNOSIS — I313 Pericardial effusion (noninflammatory): Secondary | ICD-10-CM | POA: Diagnosis not present

## 2019-09-28 DIAGNOSIS — Y92230 Patient room in hospital as the place of occurrence of the external cause: Secondary | ICD-10-CM | POA: Diagnosis not present

## 2019-09-28 DIAGNOSIS — I82291 Chronic embolism and thrombosis of other thoracic veins: Secondary | ICD-10-CM | POA: Diagnosis present

## 2019-09-28 DIAGNOSIS — M7989 Other specified soft tissue disorders: Secondary | ICD-10-CM | POA: Diagnosis not present

## 2019-09-28 DIAGNOSIS — I82211 Chronic embolism and thrombosis of superior vena cava: Secondary | ICD-10-CM | POA: Diagnosis present

## 2019-09-28 DIAGNOSIS — Y838 Other surgical procedures as the cause of abnormal reaction of the patient, or of later complication, without mention of misadventure at the time of the procedure: Secondary | ICD-10-CM | POA: Diagnosis not present

## 2019-09-28 DIAGNOSIS — G8929 Other chronic pain: Secondary | ICD-10-CM | POA: Diagnosis present

## 2019-09-28 DIAGNOSIS — I312 Hemopericardium, not elsewhere classified: Secondary | ICD-10-CM | POA: Diagnosis not present

## 2019-09-28 DIAGNOSIS — Z79899 Other long term (current) drug therapy: Secondary | ICD-10-CM

## 2019-09-28 DIAGNOSIS — I9789 Other postprocedural complications and disorders of the circulatory system, not elsewhere classified: Secondary | ICD-10-CM | POA: Diagnosis not present

## 2019-09-28 DIAGNOSIS — R229 Localized swelling, mass and lump, unspecified: Secondary | ICD-10-CM | POA: Diagnosis present

## 2019-09-28 DIAGNOSIS — C799 Secondary malignant neoplasm of unspecified site: Secondary | ICD-10-CM | POA: Diagnosis present

## 2019-09-28 DIAGNOSIS — Z7952 Long term (current) use of systemic steroids: Secondary | ICD-10-CM

## 2019-09-28 DIAGNOSIS — Z9889 Other specified postprocedural states: Secondary | ICD-10-CM

## 2019-09-28 DIAGNOSIS — Z8249 Family history of ischemic heart disease and other diseases of the circulatory system: Secondary | ICD-10-CM

## 2019-09-28 DIAGNOSIS — Z978 Presence of other specified devices: Secondary | ICD-10-CM

## 2019-09-28 DIAGNOSIS — R0902 Hypoxemia: Secondary | ICD-10-CM

## 2019-09-28 DIAGNOSIS — I82B21 Chronic embolism and thrombosis of right subclavian vein: Secondary | ICD-10-CM | POA: Diagnosis present

## 2019-09-28 DIAGNOSIS — Z419 Encounter for procedure for purposes other than remedying health state, unspecified: Secondary | ICD-10-CM

## 2019-09-28 DIAGNOSIS — J90 Pleural effusion, not elsewhere classified: Secondary | ICD-10-CM | POA: Diagnosis not present

## 2019-09-28 DIAGNOSIS — R2233 Localized swelling, mass and lump, upper limb, bilateral: Secondary | ICD-10-CM | POA: Diagnosis not present

## 2019-09-28 DIAGNOSIS — E876 Hypokalemia: Secondary | ICD-10-CM | POA: Diagnosis present

## 2019-09-28 DIAGNOSIS — I9589 Other hypotension: Secondary | ICD-10-CM | POA: Diagnosis not present

## 2019-09-28 DIAGNOSIS — Z90710 Acquired absence of both cervix and uterus: Secondary | ICD-10-CM

## 2019-09-28 DIAGNOSIS — I361 Nonrheumatic tricuspid (valve) insufficiency: Secondary | ICD-10-CM | POA: Diagnosis not present

## 2019-09-28 DIAGNOSIS — I2602 Saddle embolus of pulmonary artery with acute cor pulmonale: Secondary | ICD-10-CM | POA: Diagnosis not present

## 2019-09-28 LAB — BASIC METABOLIC PANEL
Anion gap: 15 (ref 5–15)
BUN: 24 mg/dL — ABNORMAL HIGH (ref 6–20)
CO2: 21 mmol/L — ABNORMAL LOW (ref 22–32)
Calcium: 9.6 mg/dL (ref 8.9–10.3)
Chloride: 100 mmol/L (ref 98–111)
Creatinine, Ser: 0.79 mg/dL (ref 0.44–1.00)
GFR calc Af Amer: >60 mL/min (ref >60)
GFR calc non Af Amer: >60 mL/min (ref >60)
Glucose, Bld: 171 mg/dL — ABNORMAL HIGH (ref 70–99)
Potassium: 3.3 mmol/L — ABNORMAL LOW (ref 3.5–5.1)
Sodium: 136 mmol/L (ref 135–145)

## 2019-09-28 LAB — CBC WITH DIFFERENTIAL/PLATELET
Abs Immature Granulocytes: 0 10*3/uL (ref 0.00–0.07)
Band Neutrophils: 0 %
Basophils Absolute: 0.3 10*3/uL — ABNORMAL HIGH (ref 0.0–0.1)
Basophils Relative: 1 %
Blasts: 0 %
Eosinophils Absolute: 0 10*3/uL (ref 0.0–0.5)
Eosinophils Relative: 0 %
HCT: 34.7 % — ABNORMAL LOW (ref 36.0–46.0)
Hemoglobin: 11.1 g/dL — ABNORMAL LOW (ref 12.0–15.0)
Lymphocytes Relative: 2 %
Lymphs Abs: 0.6 10*3/uL — ABNORMAL LOW (ref 0.7–4.0)
MCH: 30.1 pg (ref 26.0–34.0)
MCHC: 32 g/dL (ref 30.0–36.0)
MCV: 94 fL (ref 80.0–100.0)
Metamyelocytes Relative: 0 %
Monocytes Absolute: 0.3 10*3/uL (ref 0.1–1.0)
Monocytes Relative: 1 %
Myelocytes: 0 %
Neutro Abs: 27.1 10*3/uL — ABNORMAL HIGH (ref 1.7–7.7)
Neutrophils Relative %: 96 %
Other: 0 %
Platelets: 69 10*3/uL — ABNORMAL LOW (ref 150–400)
Promyelocytes Relative: 0 %
RBC: 3.69 MIL/uL — ABNORMAL LOW (ref 3.87–5.11)
RDW: 15.1 % (ref 11.5–15.5)
WBC: 28.3 10*3/uL — ABNORMAL HIGH (ref 4.0–10.5)
nRBC: 0 % (ref 0.0–0.2)
nRBC: 0 /100 WBC

## 2019-09-28 LAB — TROPONIN I (HIGH SENSITIVITY)
Troponin I (High Sensitivity): 11 ng/L (ref <18)
Troponin I (High Sensitivity): 10 ng/L (ref <18)

## 2019-09-28 LAB — BRAIN NATRIURETIC PEPTIDE: B Natriuretic Peptide: 335.7 pg/mL — ABNORMAL HIGH (ref 0.0–100.0)

## 2019-09-28 IMAGING — CT CT HEAD W/O CM
3 series · 14 of 47 positions shown, 16 images · non-contrast
Comparison: None.

CLINICAL DATA: History of breast cancer. Surveillance prior to
initiation of anticoagulant therapy for pulmonary embolism.

EXAM:
CT HEAD WITHOUT CONTRAST
TECHNIQUE: Contiguous axial images were obtained from the base of the skull
through the vertex without intravenous contrast.

[Series 2: head wo · axial · 0.41mm/px · z∈[-144,-19]mm · 8 of 31 slices shown, 10 images]
[im 3/31  brain]
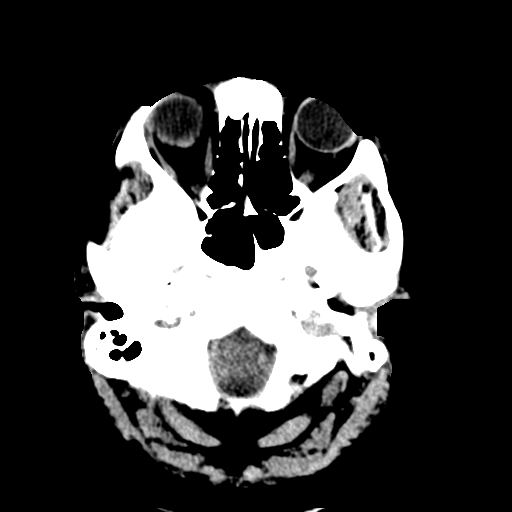
[im 3/31  bone]
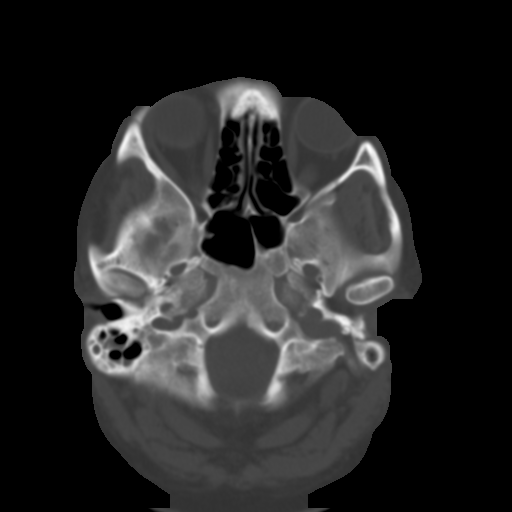
[im 7/31  brain]
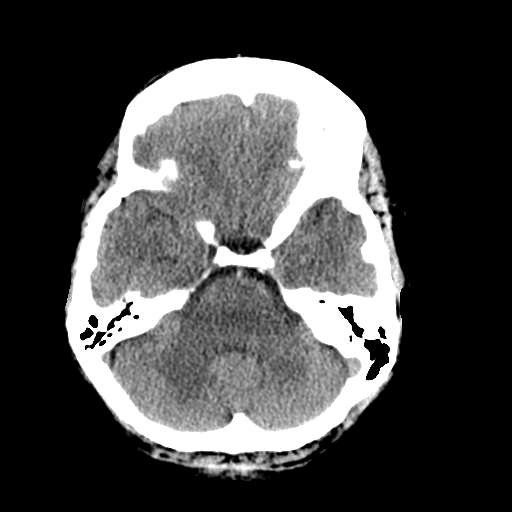
[im 10/31  brain]
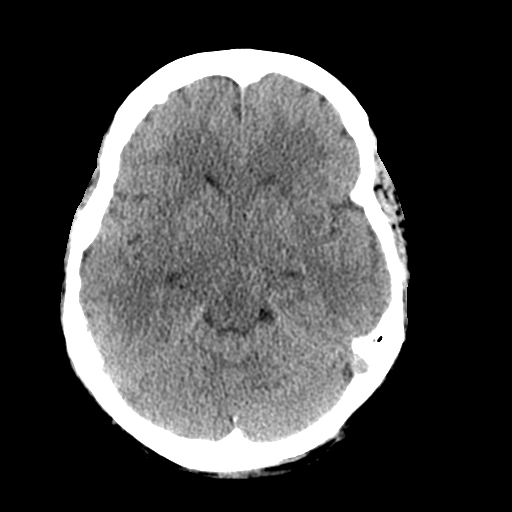
[im 14/31  brain]
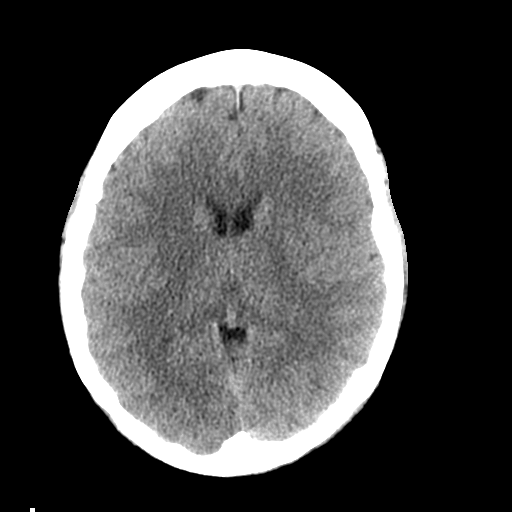
[im 17/31  brain]
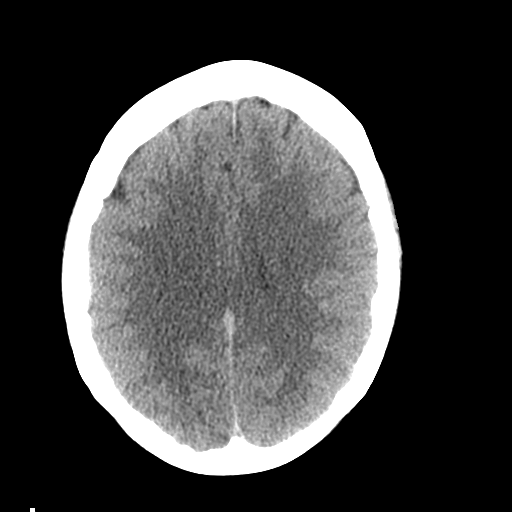
[im 17/31  bone]
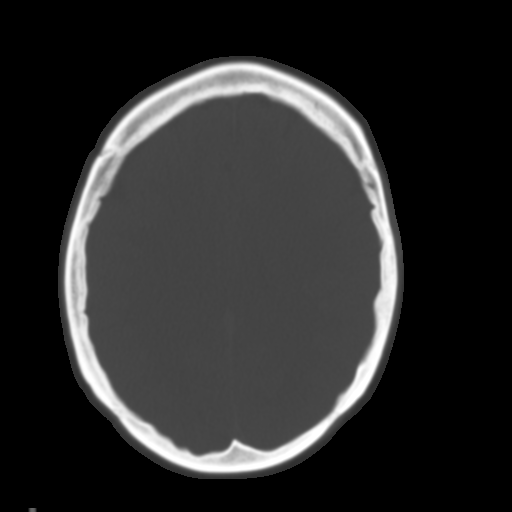
[im 21/31  brain]
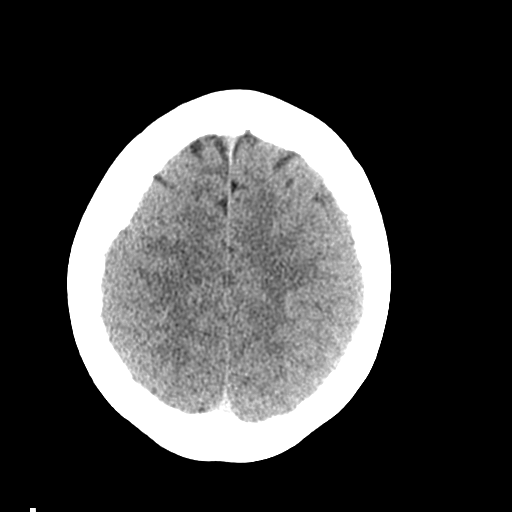
[im 24/31  brain]
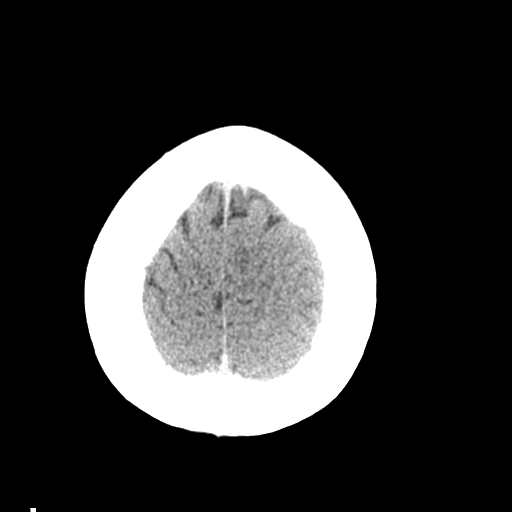
[im 28/31  brain]
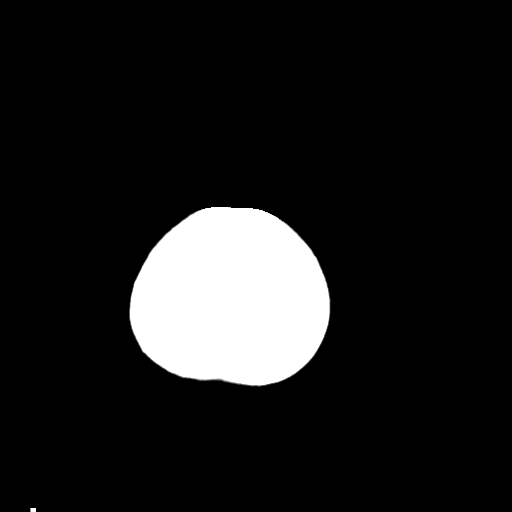

[Series 5: coronal soft tissue · coronal · 0.29mm/px · 3 of 68 slices shown]
[im 23/68  brain]
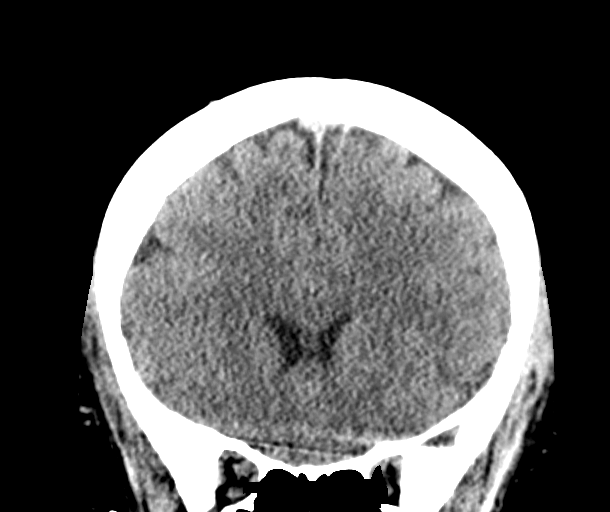
[im 30/68  brain]
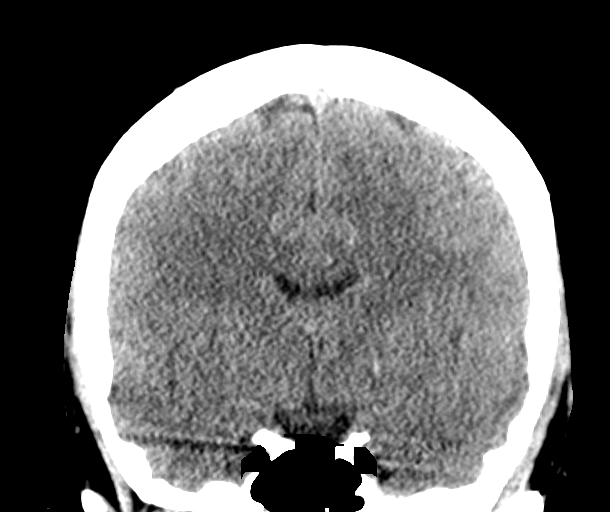
[im 38/68  brain]
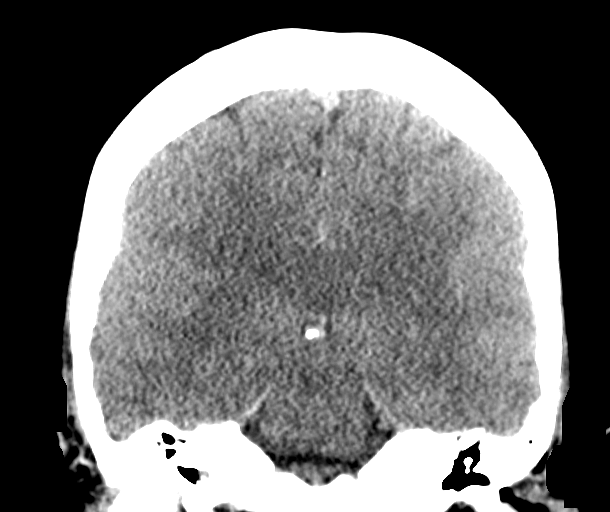

[Series 6: sagittal soft tissue · sagittal · 0.29mm/px · 3 of 59 slices shown]
[im 20/59  brain]
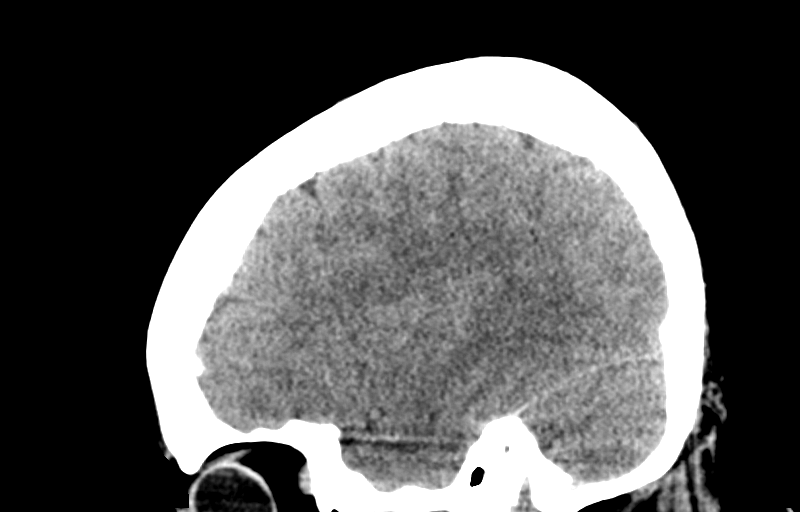
[im 30/59  brain]
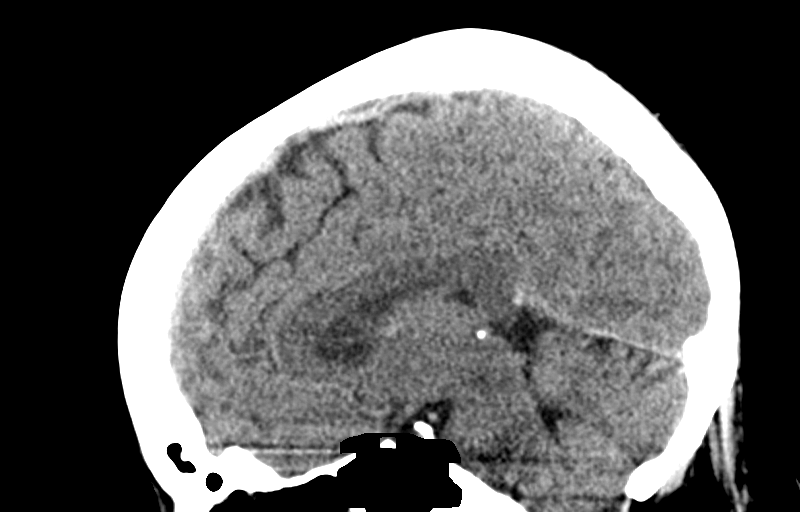
[im 39/59  brain]
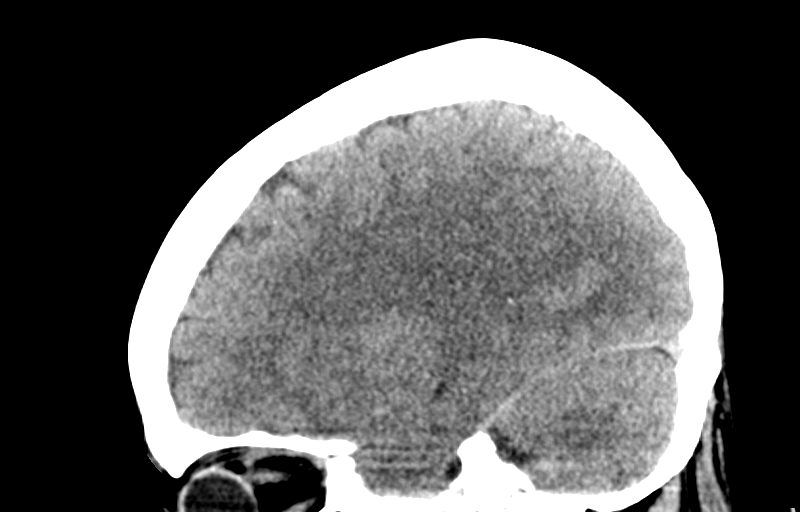

[14 of 47 positions shown; findings below may reference images not displayed]

FINDINGS: Brain: There is no evidence of acute intracranial hemorrhage, mass
lesion, brain edema or extra-axial fluid collection. The ventricles
and subarachnoid spaces are appropriately sized for age. There is no
CT evidence of acute cortical infarction.

Vascular:  No hyperdense vessel identified.

Skull: Negative for fracture or focal lesion.

Sinuses/Orbits: There is a small right mastoid effusion without
coalescence. The visualized paranasal sinuses, left mastoid air
cells and middle ears are clear.

Other: None.
IMPRESSION: No acute intracranial or calvarial findings. No evidence of
metastatic disease. Small right mastoid effusion.

## 2019-09-28 IMAGING — CT CT ANGIO CHEST
2 of 7 series · 17 of 46 positions shown · IV contrast (APPLIED)
Comparison: [DATE] chest radiograph.

CLINICAL DATA: Right breast cancer with ongoing chemotherapy,
presenting with dyspnea and facial, neck and right upper extremity
swelling.

EXAM:
CT ANGIOGRAPHY CHEST WITH CONTRAST
TECHNIQUE: Multidetector CT imaging of the chest was performed using the
standard protocol during bolus administration of intravenous
contrast. Multiplanar CT image reconstructions and MIPs were
obtained to evaluate the vascular anatomy.
CONTRAST:  100mL OMNIPAQUE IOHEXOL 350 MG/ML SOLN

[Series 5: thins · axial · 0.71mm/px · z∈[-276,-45]mm · 15 of 263 slices shown]
[im 16/263  lung]
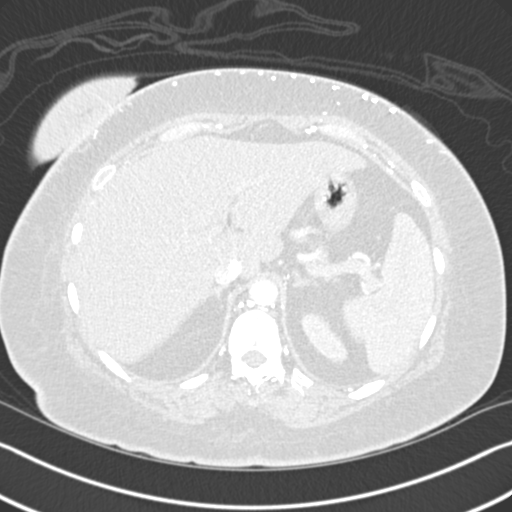
[im 31/263  soft-tissue]
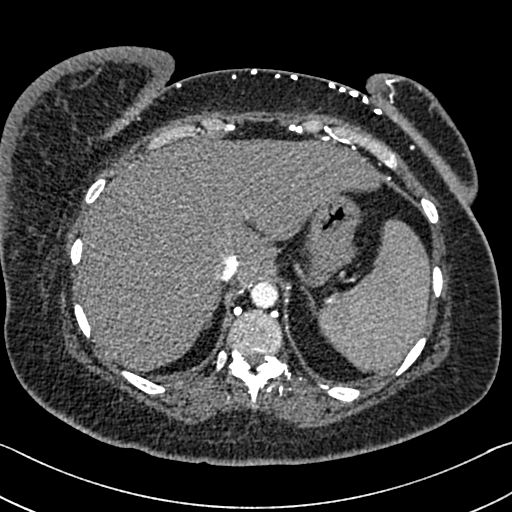
[im 47/263  lung]
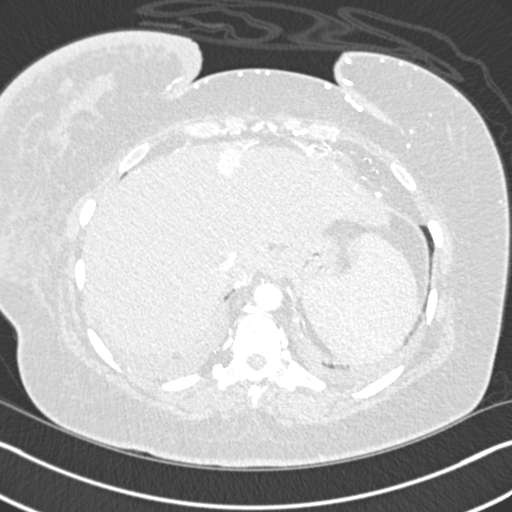
[im 62/263  soft-tissue]
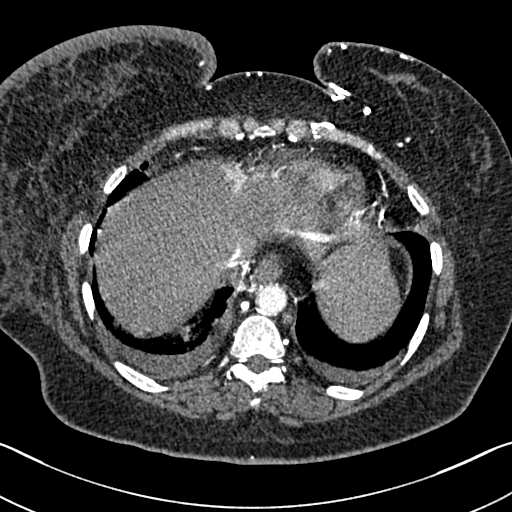
[im 78/263  lung]
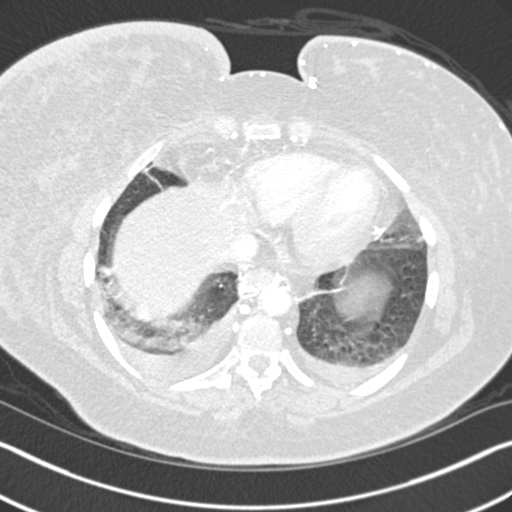
[im 93/263  soft-tissue]
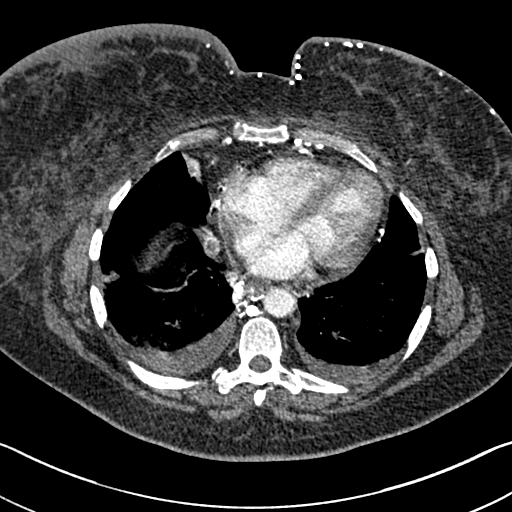
[im 108/263  lung]
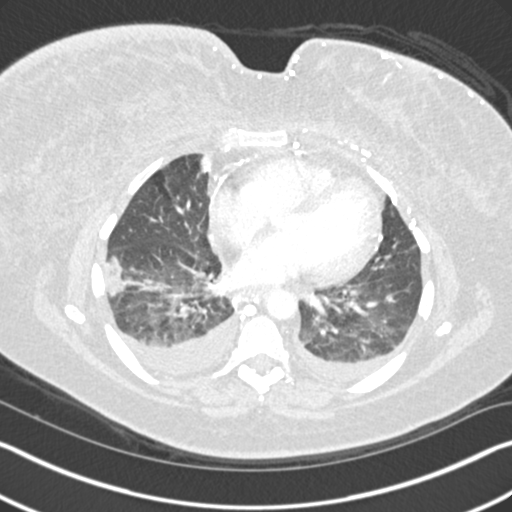
[im 139/263  soft-tissue]
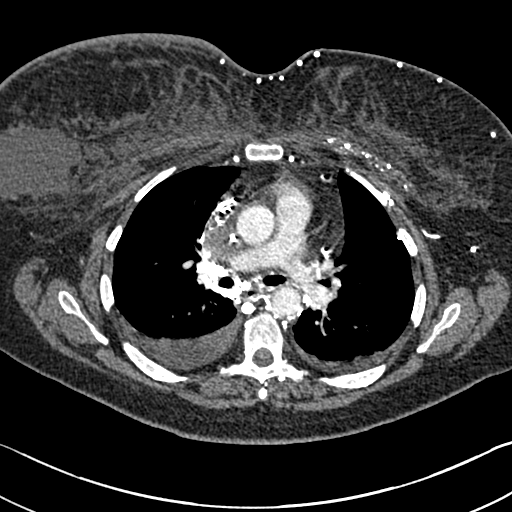
[im 155/263  lung]
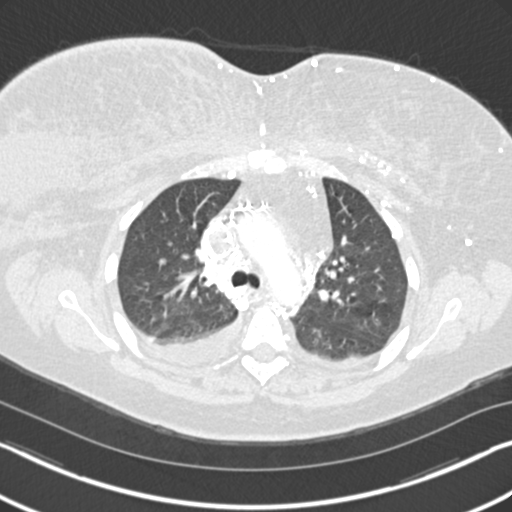
[im 170/263  soft-tissue]
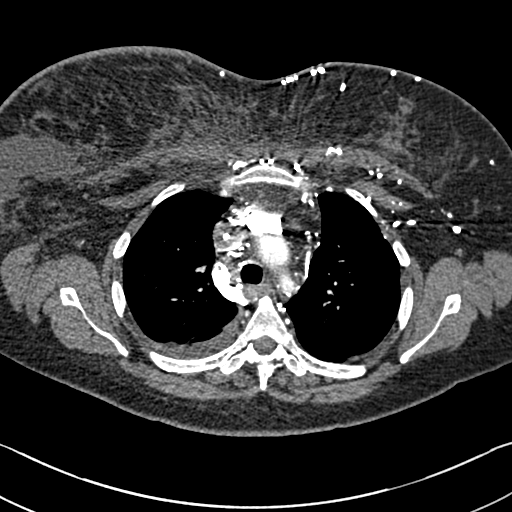
[im 185/263  lung]
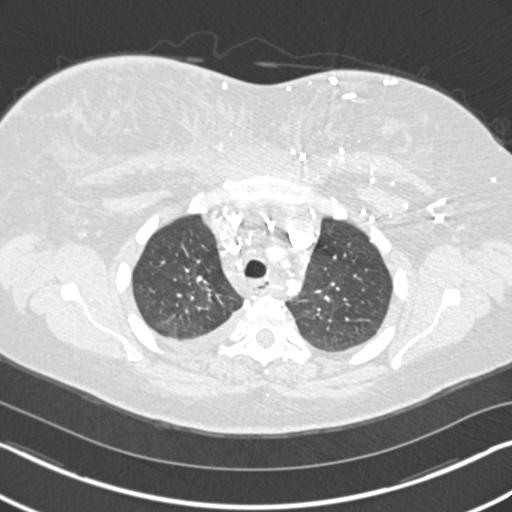
[im 201/263  soft-tissue]
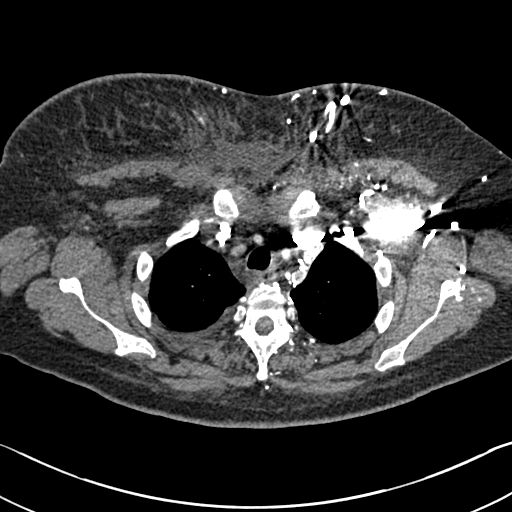
[im 216/263  lung]
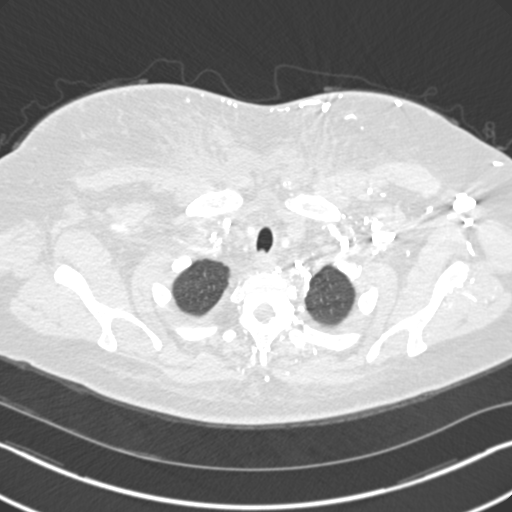
[im 232/263  soft-tissue]
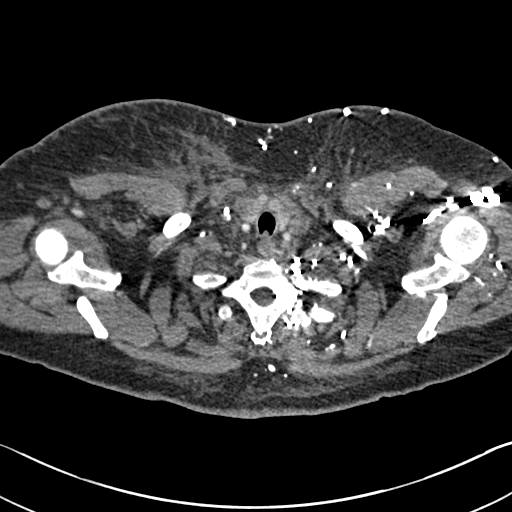
[im 247/263  lung]
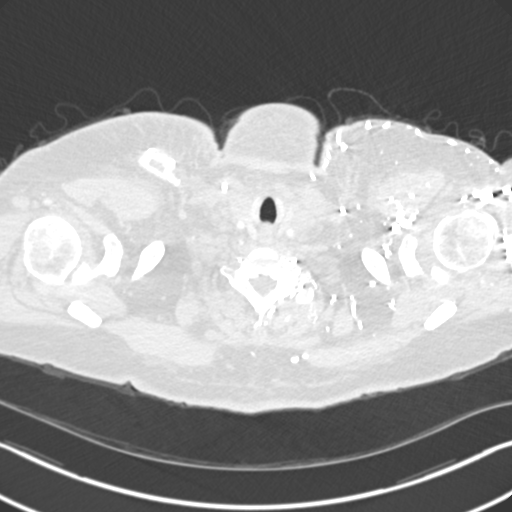

[Series 6: coronal mpr · coronal · 0.52mm/px · 2 of 83 slices shown]
[im 28/83  soft-tissue]
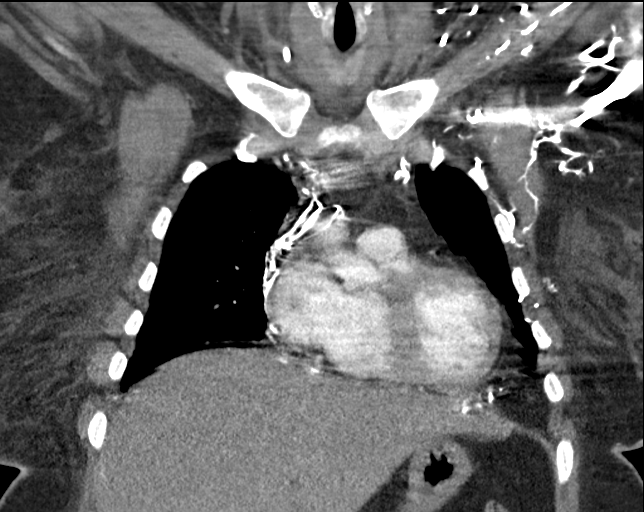
[im 55/83  soft-tissue]
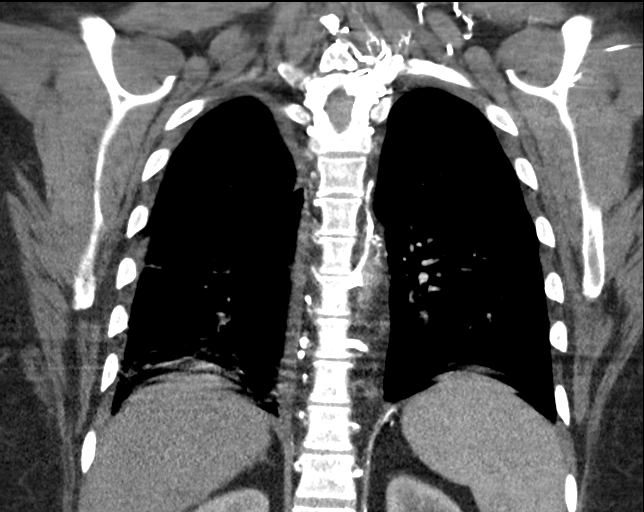

[17 of 46 positions shown; findings below may reference images not displayed]

FINDINGS: Cardiovascular: The study is low to moderate quality for the
evaluation of pulmonary embolism, with significant motion
degradation and suboptimal contrast opacification. There are acute
segmental pulmonary emboli in the bilateral lower lobes (series
5/image 140 on the right and image 132 on the left). No
central/saddle pulmonary emboli. Normal course and caliber of the
thoracic aorta. Normal caliber main pulmonary artery (2.5 cm
diameter). Right internal jugular Port-A-Cath terminates in the
lower third of the SVC. Low-attenuation occlusive filling defect
throughout the SVC surrounding the Port-A-Cath with near occlusive
filling defect surrounding the Port-A-Cath in the right
brachiocephalic vein. Extensive opacified venous collaterals
throughout the mediastinum and left chest wall draining to the IVC
via diaphragmatic collaterals. Normal heart size. No significant
pericardial fluid/thickening.

Mediastinum/Nodes: Hypodense 1.6 cm right thyroid nodule anteriorly.
Unremarkable esophagus. No pathologically enlarged axillary,
mediastinal or hilar lymph nodes.

Lungs/Pleura: No pneumothorax. Small dependent bilateral pleural
effusions, right greater than left. There is a focus of
consolidation in the peripheral right lower lobe measuring 2.9 x
cm (series 10/image 77). Hypoventilatory changes are present both
lung bases. No additional significant pulmonary nodules on this
motion degraded scan.

Upper abdomen: Cholecystectomy.

Musculoskeletal: No aggressive appearing focal osseous lesions. Mild
thoracic spondylosis. Partially visualized fluid density focus in
the lateral right breast measuring at least 6.0 x 5.2 cm (series
5/image 116). Asymmetric skin thickening throughout right breast.
Subcutaneous edema throughout ventral upper chest wall.

Review of the MIP images confirms the above findings.
IMPRESSION: 1. Acute segmental bilateral lower lobe pulmonary emboli.
2. Occlusive low-attenuation filling defect throughout the SVC
surrounding the right IJ Port-A-Cath with extensive opacified venous
collaterals throughout the mediastinum and ventral left chest wall
draining to the IVC across the diaphragm. Findings are compatible
with acute occlusive catheter associated SVC thrombosis.
3. Peripheral right lower lobe 2.9 cm focus of consolidation,
indeterminate with differential including pulmonary infarct,
pneumonia or neoplasm. Short-term follow-up chest CT advised in 3
months.
4. Small dependent bilateral pleural effusions.
5. Asymmetric right breast skin thickening with subcutaneous edema
throughout ventral upper chest wall. Partially visualized fluid
density focus in the lateral right breast, potentially representing
postsurgical fluid collection.
6. Hypodense 1.6 cm anterior right thyroid nodule. No follow-up
recommended unless clinically warranted (ref: [HOSPITAL]. [DATE]): 143-50).

Critical Value/emergent results were called by telephone at the time
of interpretation on [DATE] at [DATE] to provider JUSTINO ,
who verbally acknowledged these results.

## 2019-09-28 MED ORDER — ACETAMINOPHEN 325 MG PO TABS
650.0000 mg | ORAL_TABLET | Freq: Once | ORAL | Status: AC
  Administered 2019-09-28: 650 mg via ORAL
  Filled 2019-09-28: qty 2

## 2019-09-28 MED ORDER — HEPARIN SOD (PORK) LOCK FLUSH 100 UNIT/ML IV SOLN: 500.0000 [IU] | Freq: Once | INTRAVENOUS | Status: DC

## 2019-09-28 MED ORDER — SODIUM CHLORIDE (PF) 0.9 % IJ SOLN
INTRAMUSCULAR | Status: AC
  Filled 2019-09-28: qty 50

## 2019-09-28 MED ORDER — HEPARIN BOLUS VIA INFUSION
3500.0000 [IU] | Freq: Once | INTRAVENOUS | Status: AC
  Administered 2019-09-28: 3500 [IU] via INTRAVENOUS
  Filled 2019-09-28: qty 3500

## 2019-09-28 MED ORDER — MORPHINE SULFATE (PF) 4 MG/ML IV SOLN
4.0000 mg | Freq: Once | INTRAVENOUS | Status: DC
  Filled 2019-09-28: qty 1

## 2019-09-28 MED ORDER — HEPARIN SOD (PORK) LOCK FLUSH 100 UNIT/ML IV SOLN
INTRAVENOUS | Status: AC
  Filled 2019-09-28: qty 5

## 2019-09-28 MED ORDER — HEPARIN (PORCINE) 25000 UT/250ML-% IV SOLN
1350.0000 [IU]/h | INTRAVENOUS | Status: DC
  Administered 2019-09-28: 1100 [IU]/h via INTRAVENOUS
  Administered 2019-09-29: 1350 [IU]/h via INTRAVENOUS
  Filled 2019-09-28 (×2): qty 250

## 2019-09-28 MED ORDER — IOHEXOL 350 MG/ML SOLN
100.0000 mL | Freq: Once | INTRAVENOUS | Status: AC | PRN
  Administered 2019-09-28: 15:00:00 100 mL via INTRAVENOUS

## 2019-09-28 NOTE — ED Notes (Signed)
[facility] notified for transport to [facility].   

## 2019-09-28 NOTE — Progress Notes (Signed)
ANTICOAGULATION CONSULT NOTE   Pharmacy Consult for Heparin Indication: pulmonary embolus & SVC syndrome  No Known Allergies  Patient Measurements: Height: 5\' 1"  (154.9 cm) Weight: 211 lb (95.7 kg) IBW/kg (Calculated) : 47.8 Heparin Dosing Weight: 70.5 kg  Vital Signs: Temp: 97.9 F (36.6 C) (12/18 1658) Temp Source: Oral (12/18 1658) BP: 116/51 (12/18 1900) Pulse Rate: 95 (12/18 1900)  Labs: Recent Labs    09/28/19 1652  HGB 11.1*  HCT 34.7*  PLT 69*  CREATININE 0.79  TROPONINIHS 11   Estimated Creatinine Clearance: 92.9 mL/min (by C-G formula based on SCr of 0.79 mg/dL).  Medical History: Past Medical History:  Diagnosis Date  . Cancer ([place])   . Chronic low back pain with left-sided sciatica   . Family history of leukemia   . Family history of stomach cancer   . Hypertension    Medications:  Scheduled:  . heparin  3,500 Units Intravenous Once   Infusions:  . heparin     Assessment: 17 yoF with new bilateral PE, SVC syndrome Baseline Hgb 11.1, Plt 69 12/18 Heparin bolus 3500 units, infusion at 1100 units/hr  Goal of Therapy:  Heparin level 0.3-0.7 units/ml Monitor platelets by anticoagulation protocol: Yes   Plan:   Heparin 3500 unit bolus, infusion at 1100 units/hr  Check 6 hr Hep level  Daily CBC, order daily Hep level at steady state  Minda Ditto PharmD 09/28/2019,7:26 PM

## 2019-09-28 NOTE — ED Provider Notes (Addendum)
[facility] DEPT Provider Note   CSN: [id number] Arrival date & time: 09/28/19  1637     History Chief Complaint  Patient presents with  . Bilateral Pulmonary Embolism    Veronica [name] is a 48 y.o. female.  The history is provided by the patient and medical records. No language interpreter was used.       48 year old female with history of breast cancer sent here from [facility] for further management of newly diagnosed PE and occlusion of SVC.  Patient reports she was diagnosed with breast cancer in July and is currently receiving chemotherapy after several lumpectomy procedure.  She also has a Port-A-Cath placed to the right chest in November.  For the past 3 to 4 days she noticed increased swelling to her right arm as well as her face.  She was reaching out to her cancer doctor who initially instructed patient to take Lasix but it did not provide any significant improvement.  She was seen by her doctor today for her complaint and a chest CT angiogram was obtained showing evidence of bilateral pulmonary emboli as well as clots along the SVC.  Patient recommend to come here for further evaluation.  She denies any significant shortness of breath aside from some mild shortness of breath when she exerts herself.  She does not complain of any significant pain.  No fever chills no productive cough no loss of taste or smell.  Recently had a negative Covid test several days ago.  She denies any prior history of PE.  She denies any leg swelling.  She denies any metastatic cancer.  Patient report her oncologist is Dr. Jana Hakim.  Past Medical History:  Diagnosis Date  . Cancer ([place])   . Chronic low back pain with left-sided sciatica   . Family history of leukemia   . Family history of stomach cancer   . Hypertension     Patient Active Problem List   Diagnosis Date Noted  . Angioedema 09/28/2019  . Cough   . Sepsis ([place]) 08/29/2019  . Neutropenia  with fever ([place]) 08/29/2019  . Hematochezia 08/29/2019  . Anemia 08/29/2019  . Thrombocytopenia ([place]) 08/29/2019  . Port-A-Cath in place 07/24/2019  . Genetic testing 05/17/2019  . Family history of leukemia   . Family history of stomach cancer   . Malignant neoplasm of upper-outer quadrant of right breast in female, estrogen receptor positive ([place]) 05/07/2019  . S/P laparoscopic assisted vaginal hysterectomy ([facility]) 05/17/2011    Class: Chronic  . FOOT PAIN, RIGHT 07/11/2010    Past Surgical History:  Procedure Laterality Date  . BREAST BIOPSY Right 05/02/2019   right clips X2  . BREAST LUMPECTOMY WITH RADIOACTIVE SEED AND SENTINEL LYMPH NODE BIOPSY Right 06/12/2019   Procedure: RIGHT BREAST RADIOACTIVE SEED LUMPECTOMY  AND RIGHT AXILLARY SEED TARGETED LYMPH NODE AND AXILLARY SENTINEL LYMPH NODE MAPPING;  Surgeon: Erroll Luna, MD;  Location: [facility];  Service: General;  Laterality: Right;  PEC BLOCK  . C/S x2  '98 '03  . CESAREAN SECTION    . CHOLECYSTECTOMY    . LAPAROSCOPIC ASSISTED VAGINAL HYSTERECTOMY  05/17/2011   Procedure: LAPAROSCOPIC ASSISTED VAGINAL HYSTERECTOMY;  Surgeon: Sharene Butters;  Location: [facility];  Service: Gynecology;  Laterality: N/A;  Laparoscopic Assisted Vaginal Hysterectomy With Lysis Of Adhesions  . PORTACATH PLACEMENT Right 06/12/2019   Procedure: INSERTION PORT-A-CATH WITH ULTRASOUND;  Surgeon: Erroll Luna, MD;  Location: [facility];  Service: General;  Laterality: Right;  . RE-EXCISION OF BREAST LUMPECTOMY Right 07/17/2019   Procedure: RE-EXCISION OF RIGHT BREAST LUMPECTOMY;  Surgeon: Erroll Luna, MD;  Location: [facility];  Service: General;  Laterality: Right;  . TUBAL LIGATION       OB History   No obstetric history on file.     Family History  Problem Relation Age of Onset  . Arthritis Mother   . COPD Mother   . Hypertension Mother   . Hyperlipidemia Mother   . Leukemia Brother 55  .  Stomach cancer Maternal Grandmother        late 29s    Social History   Tobacco Use  . Smoking status: Never Smoker  . Smokeless tobacco: Never Used  Substance Use Topics  . Alcohol use: Yes    Alcohol/week: 2.0 standard drinks    Types: 2 Standard drinks or equivalent per week    Comment: "2-3 a week"  . Drug use: No    Home Medications Prior to Admission medications   Medication Sig Start Date End Date Taking? Authorizing Provider  acetaminophen (TYLENOL) 500 MG tablet Take 500 mg by mouth every 6 (six) hours as needed for mild pain.     [provider]  Cholecalciferol (VITAMIN D3) 25 MCG (1000 UT) CHEW Chew 1 tablet by mouth daily.     [provider]  dexamethasone (DECADRON) 4 MG tablet TAKE TWO TABLETS BY MOUTH ONCE A DAY ON THE DAY AFTER CHEMOTHERAPY AND THEN TAKE TWO TABLETS BY MOUTH TWICE DAILY FOR 2 DAYS 08/22/19   Magrinat, Virgie Dad, MD  famotidine (PEPCID) 10 MG tablet Take 10 mg by mouth 2 (two) times daily.    [provider]  furosemide (LASIX) 20 MG tablet Take 1 tablet (20 mg total) by mouth daily for 3 days. 09/01/19 09/04/19  Sherwood Gambler, MD  lidocaine-prilocaine (EMLA) cream Apply to affected area once 06/21/19   Magrinat, Virgie Dad, MD  loratadine (CLARITIN) 10 MG tablet Take 10 mg by mouth See admin instructions. Takes for 5 days after chemo    [provider]  LORazepam (ATIVAN) 0.5 MG tablet Take 1 tablet (0.5 mg total) by mouth at bedtime as needed (Nausea or vomiting). 06/21/19   Magrinat, Virgie Dad, MD  olmesartan-hydrochlorothiazide (BENICAR HCT) 20-12.5 MG tablet Take 1 tablet by mouth daily.    [provider]  ondansetron (ZOFRAN) 8 MG tablet Take 1 tablet (8 mg total) by mouth every 8 (eight) hours as needed for nausea or vomiting. 08/21/19   Magrinat, Virgie Dad, MD  potassium chloride SA (KLOR-CON) 20 MEQ tablet Take 2 tablets (40 mEq total) by mouth 2 (two) times daily for 3 days. 09/01/19 09/04/19  Sherwood Gambler, MD  prochlorperazine (COMPAZINE) 10 MG tablet Take 1 tablet (10 mg total) by mouth every 6 (six) hours as needed (Nausea or vomiting). 06/21/19   Magrinat, Virgie Dad, MD    Allergies    Patient has no known allergies.  Review of Systems   Review of Systems  All other systems reviewed and are negative.   Physical Exam Updated Vital Signs BP (!) 94/56   Pulse (!) 103   Temp 97.9 F (36.6 C) (Oral)   Resp (!) 21   Ht 5\' 1"  (1.549 m)   Wt 95.7 kg   SpO2 100%   BMI 39.87 kg/m   Physical Exam Vitals and nursing note reviewed.  Constitutional:      General: She is not in acute distress.  Appearance: She is well-developed.  HENT:     Head: Atraumatic.  Eyes:     Conjunctiva/sclera: Conjunctivae normal.  Cardiovascular:     Rate and Rhythm: Tachycardia present.     Pulses: Normal pulses.     Heart sounds: Normal heart sounds.  Pulmonary:     Effort: Pulmonary effort is normal.     Breath sounds: Normal breath sounds. No wheezing, rhonchi or rales.  Abdominal:     Palpations: Abdomen is soft.     Tenderness: There is no abdominal tenderness.  Musculoskeletal:        General: No swelling (Edema noted to face and right upper extremity, nonpitting.).     Cervical back: Neck supple.  Skin:    Findings: No rash.  Neurological:     Mental Status: She is alert and oriented to person, place, and time.  Psychiatric:        Mood and Affect: Mood normal.     ED Results / Procedures / Treatments   Labs (all labs ordered are listed, but only abnormal results are displayed) Labs Reviewed  BASIC METABOLIC PANEL - Abnormal; Notable for the following components:      Result Value   Potassium 3.3 (*)    CO2 21 (*)    Glucose, Bld 171 (*)    BUN 24 (*)    All other components within normal limits  BRAIN NATRIURETIC PEPTIDE - Abnormal; Notable for the following components:   B Natriuretic Peptide 335.7 (*)    All other components within normal limits  CBC WITH  DIFFERENTIAL/PLATELET - Abnormal; Notable for the following components:   WBC 28.3 (*)    RBC 3.69 (*)    Hemoglobin 11.1 (*)    HCT 34.7 (*)    Platelets 69 (*)    Neutro Abs 27.1 (*)    Lymphs Abs 0.6 (*)    Basophils Absolute 0.3 (*)    All other components within normal limits  SARS CORONAVIRUS 2 (TAT 6-24 HRS)  HEPARIN LEVEL (UNFRACTIONATED)  CBC  TROPONIN I (HIGH SENSITIVITY)  TROPONIN I (HIGH SENSITIVITY)    EKG EKG Interpretation  Date/Time:  Friday September 28 2019 21:08:10 EST Ventricular Rate:  101 PR Interval:    QRS Duration: 86 QT Interval:  359 QTC Calculation: 466 R Axis:   53 Text Interpretation: Sinus tachycardia Low voltage, precordial leads Borderline T abnormalities, inferior leads No significant change was found Confirmed by Ezequiel Essex [phone]) on 09/28/2019 9:16:39 PM   Radiology CT ANGIO CHEST PE W OR WO CONTRAST  Result Date: 09/28/2019 CLINICAL DATA:  Right breast cancer with ongoing chemotherapy, presenting with dyspnea and facial, neck and right upper extremity swelling. EXAM: CT ANGIOGRAPHY CHEST WITH CONTRAST TECHNIQUE: Multidetector CT imaging of the chest was performed using the standard protocol during bolus administration of intravenous contrast. Multiplanar CT image reconstructions and MIPs were obtained to evaluate the vascular anatomy. CONTRAST:  116mL OMNIPAQUE IOHEXOL 350 MG/ML SOLN COMPARISON:  09/24/2019 chest radiograph. FINDINGS: Cardiovascular: The study is low to moderate quality for the evaluation of pulmonary embolism, with significant motion degradation and suboptimal contrast opacification. There are acute segmental pulmonary emboli in the bilateral lower lobes (series 5/image 140 on the right and image 132 on the left). No central/saddle pulmonary emboli. Normal course and caliber of the thoracic aorta. Normal caliber main pulmonary artery (2.5 cm diameter). Right internal jugular Port-A-Cath terminates in the lower third of the  SVC. Low-attenuation occlusive filling defect throughout the SVC surrounding the  Port-A-Cath with near occlusive filling defect surrounding the Port-A-Cath in the right brachiocephalic vein. Extensive opacified venous collaterals throughout the mediastinum and left chest wall draining to the IVC via diaphragmatic collaterals. Normal heart size. No significant pericardial fluid/thickening. Mediastinum/Nodes: Hypodense 1.6 cm right thyroid nodule anteriorly. Unremarkable esophagus. No pathologically enlarged axillary, mediastinal or hilar lymph nodes. Lungs/Pleura: No pneumothorax. Small dependent bilateral pleural effusions, right greater than left. There is a focus of consolidation in the peripheral right lower lobe measuring 2.9 x 1.6 cm (series 10/image 77). Hypoventilatory changes are present both lung bases. No additional significant pulmonary nodules on this motion degraded scan. Upper abdomen: Cholecystectomy. Musculoskeletal: No aggressive appearing focal osseous lesions. Mild thoracic spondylosis. Partially visualized fluid density focus in the lateral right breast measuring at least 6.0 x 5.2 cm (series 5/image 116). Asymmetric skin thickening throughout right breast. Subcutaneous edema throughout ventral upper chest wall. Review of the MIP images confirms the above findings. IMPRESSION: 1. Acute segmental bilateral lower lobe pulmonary emboli. 2. Occlusive low-attenuation filling defect throughout the SVC surrounding the right IJ Port-A-Cath with extensive opacified venous collaterals throughout the mediastinum and ventral left chest wall draining to the IVC across the diaphragm. 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 3. Peripheral right lower lobe 2.9 cm focus of consolidation, indeterminate with differential including pulmonary infarct, pneumonia or neoplasm. Short-term follow-up chest CT advised in 3 months. 4. Small dependent bilateral pleural effusions. 5.  Asymmetric right breast skin thickening with subcutaneous edema throughout ventral upper chest wall. Partially visualized fluid density focus in the lateral right breast, potentially representing postsurgical fluid collection. 6. Hypodense 1.6 cm anterior right thyroid nodule. No follow-up recommended unless clinically warranted (ref: J Am Coll Radiol. 2015 Feb;12(2): 143-50). Critical Value/emergent results were called by telephone at the time of interpretation on 09/28/2019 at 4:09 pm to provider Sandi Mealy , who verbally acknowledged these results. Electronically Signed   By: Ilona Sorrel M.D.   On: 09/28/2019 16:11    Procedures .Critical Care Performed by: Domenic Moras, PA-C Authorized by: Domenic Moras, PA-C   Critical care provider statement:    Critical care time (minutes):  45   Critical care was time spent personally by me on the following activities:  Discussions with consultants, evaluation of patient's response to treatment, examination of patient, ordering and performing treatments and interventions, ordering and review of laboratory studies, ordering and review of radiographic studies, pulse oximetry, re-evaluation of patient's condition, obtaining history from patient or surrogate and review of old charts   (including critical care time)  Medications Ordered in ED Medications  heparin ADULT infusion 100 units/mL (25000 units/224mL sodium chloride 0.45%) (1,100 Units/hr Intravenous New Bag/Given 09/28/19 2021)  heparin bolus via infusion 3,500 Units (3,500 Units Intravenous Bolus from Bag 09/28/19 2022)    ED Course  I have reviewed the triage vital signs and the nursing notes.  Pertinent labs & imaging results that were available during my care of the patient were reviewed by me and considered in my medical decision making (see chart for details).    MDM Rules/Calculators/A&P                      BP (!) 94/56   Pulse (!) 103   Temp 97.9 F (36.6 C) (Oral)   Resp (!) 21    Ht 5\' 1"  (1.549 m)   Wt 95.7 kg   SpO2 100%   BMI 39.87 kg/m   Final Clinical Impression(s) /  ED Diagnoses Final diagnoses:  Multiple subsegmental pulmonary emboli without acute cor pulmonale (HCC)  SVC (superior vena cava obstruction)    Rx / DC Orders ED Discharge Orders    None     5:28 PM Patient complaining of facial swelling and right upper extremity swelling for the past few days.  Was seen at the cancer center and had a chest CT angiogram that demonstrated bilateral segmental PE as well as significant clot burden along her SVC and Port-A-Cath.  She has history of breast cancer currently treated with chemo.  Denies any metastatic disease.  She will benefit from anticoagulation and admission.  Will perform a screening head CT scan prior to initiation of heparin.  Care discussed with Dr. Wyvonnia Dusky.   9:01 PM Labs remarkable for elevated BNP of 335, elevated white count of 28.3, head CT scan unremarkable.  Pt currently receiving heparin treatment for her PE and SVC.  Appreciate consultation from [facility] Dr. Legrand Pitts who agrees to see and admit pt for further management.   VITINA RAD was evaluated in Emergency Department on 09/28/2019 for the symptoms described in the history of present illness. She was evaluated in the context of the global COVID-19 pandemic, which necessitated consideration that the patient might be at risk for infection with the SARS-CoV-2 virus that causes COVID-19. Institutional protocols and algorithms that pertain to the evaluation of patients at risk for COVID-19 are in a state of rapid change based on information released by regulatory bodies including the CDC and federal and state organizations. These policies and algorithms were followed during the patient's care in the ED.    Domenic Moras, PA-C 09/28/19 2104    Domenic Moras, PA-C 09/28/19 2122    Ezequiel Essex, MD 09/29/19 [phone]

## 2019-09-28 NOTE — Telephone Encounter (Signed)
TC from above Pt. Stating that she had been talking to Dr. Virgie Dad office in reference to swelling in her face she stated that this morning she's noticed that the bottom of her face seem's to be bigger and wanted to know if she could see symptom management clinic today. Appointment scheduled for symptom management clinic.

## 2019-09-28 NOTE — Progress Notes (Addendum)
[facility] Progress Note   FLETA BORGESON [id number] April 25, 1971 48 y.o.  BELITA WARSAME is managed by Dr. Lurline Del  Current therapy: Adriamycin and Cytoxan  Last treated: 09/24/2019 (cycle 4, day 1)  Next scheduled appointment with provider: 10/02/2019  BREAST CANCER SUMMARY: 48 y.o. [place], [place] woman status post right breast upper outer quadrant biopsy 05/01/2019 for a clinical T1c N1, stage IIA invasive ductal carcinoma, grade 3, estrogen and progesterone receptor positive, HER-2 nonamplified, with an MIB-1-1 of 20%.             (a) mass in the axillary tail was a positive lymph node   (1) MammaPrint obtained from the original biopsy shows a high risk luminal subtype B tumor   (2) genetics testing 05/09/2019 through the Common Hereditary Gene Panel offered by [facility] found no deleterious mutations in APC, ATM, AXIN2, BARD1, BMPR1A, BRCA1, BRCA2, BRIP1, CDH1, CDK4, CDKN2A (p14ARF), CDKN2A (p16INK4a), CHEK2, CTNNA1, DICER1, EPCAM (Deletion/duplication testing only), GREM1 (promoter region deletion/duplication testing only), KIT, MEN1, MLH1, MSH2, MSH3, MSH6, MUTYH, NBN, NF1, NHTL1, PALB2, PDGFRA, PMS2, POLD1, POLE, PTEN, RAD50, RAD51C, RAD51D, RNF43, SDHB, SDHC, SDHD, SMAD4, SMARCA4. STK11, TP53, TSC1, TSC2, and VHL.  The following genes were evaluated for sequence changes only: SDHA and HOXB13 c.251G>A variant only.              (a) A variant of uncertain significance (VUS) was detected in one of her MSH6 genes (c.831A>C).   (3) status post right lumpectomy and sentinel lymph node sampling 06/12/2019 for a pT2 pN1, stage IIA invasive ductal carcinoma, grade 2, with positive margins             (a) a total of 4 sentinel lymph nodes removed, one positive (with ECE), ine itc             (b) margin clearance 04/19/2019 successful medial margin close but negative for DCIS   (4) adjuvant chemotherapy will consist of doxorubicin and cyclophosphamide in dose dense  fashion x4 starting 07/10/2019 followed by weekly paclitaxel x12             (a) echo 06/26/2019 shows an EF of 60-65%             (b echo on 09/19/2019 shows an EF of 60-65%   (5) adjuvant radiation to follow   (5) antiestrogens to start at the completion of local treatment  Assessment: Plan:    Multiple subsegmental pulmonary emboli without acute cor pulmonale (HCC)  Localized swelling, mass, and lump of head - Plan: CT ANGIO CHEST PE W OR WO CONTRAST  Shortness of breath - Plan: CT ANGIO CHEST PE W OR WO CONTRAST  Malignant neoplasm of upper-outer quadrant of right breast in female, estrogen receptor positive ([place])   SOB, face, neck, upper extremity, and upper torso swelling with bilateral pulmonary emboli and SVC occlusion: Ms. Widmann was referred for a CT angiogram which returned showing:  FINDINGS: Cardiovascular:  The study is low to moderate quality for the evaluation of pulmonary embolism, with significant motion degradation and suboptimal contrast opacification.  There are acute segmental pulmonary emboli in the bilateral lower lobes (series 5/image 140 on the right and image 132 on the left).  No central/saddle pulmonary emboli. Normal course and caliber of the thoracic aorta.  Normal caliber main pulmonary artery (2.5 cm diameter).  Right internal jugular Port-A-Cath terminates in the lower third of the SVC.  Low-attenuation occlusive filling defect throughout the SVC surrounding the Port-A-Cath with  near occlusive filling defect surrounding the Port-A-Cath in the right brachiocephalic vein.  Extensive opacified venous collaterals throughout the mediastinum and left chest wall draining to the IVC via diaphragmatic collaterals.  Normal heart size.  No significant pericardial fluid/thickening.   Mediastinum/Nodes:  Hypodense 1.6 cm right thyroid nodule anteriorly. Unremarkable esophagus.  No pathologically enlarged axillary, mediastinal or hilar lymph nodes.     Lungs/Pleura:  No pneumothorax.  Small dependent bilateral pleural effusions, right greater than left.  There is a focus of consolidation in the peripheral right lower lobe measuring 2.9 x 1.6 cm (series 10/image 77).  Hypoventilatory changes are present both lung bases.  No additional significant pulmonary nodules on this motion degraded scan.   Upper abdomen:  Cholecystectomy.   Musculoskeletal:  No aggressive appearing focal osseous lesions.  Mild thoracic spondylosis.  Partially visualized fluid density focus in the lateral right breast measuring at least 6.0 x 5.2 cm (series 5/image 116).  Asymmetric skin thickening throughout right breast. Subcutaneous edema throughout ventral upper chest wall.   Review of the MIP images confirms the above findings.   IMPRESSION: 1. Acute segmental bilateral lower lobe pulmonary emboli. 2. Occlusive low-attenuation filling defect throughout the SVC surrounding the right IJ Port-A-Cath with extensive opacified venous collaterals throughout the mediastinum and ventral left chest wall draining to the IVC across the diaphragm. 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 3. Peripheral right lower lobe 2.9 cm focus of consolidation, indeterminate with differential including pulmonary infarct, pneumonia or neoplasm. Short-term follow-up chest CT advised in 3 months. 4. Small dependent bilateral pleural effusions. 5. Asymmetric right breast skin thickening with subcutaneous edema throughout ventral upper chest wall. Partially visualized fluid density focus in the lateral right breast, potentially representing postsurgical fluid collection. 6. Hypodense 1.6 cm anterior right thyroid nodule. No follow-up recommended unless clinically warranted (ref: J Am Coll Radiol. 2015 Feb;12(2): 143-50).  Ms. Afshar was transported to the ER for management of the above.      ER positive malignant neoplasm of the right breast:  Ms. Huron continues to be managed by Dr. Jana Hakim and is status post cycle 4 of Adriamycin and Cytoxan given with Neulasta support.  Her last cycle of chemotherapy was dosed on 09/24/2019.  She was scheduled to have a restaging CT scan completed on 10/01/2019 prior to beginning weekly Taxol.  Her next follow-up appointment with Dr. Jana Hakim was scheduled for 10/02/2019.  Please see After Visit Summary for patient specific instructions.  Future Appointments  Date Time Provider [place]  10/01/2019  3:30 PM [facility] 1 [facility] [place]  10/02/2019 12:15 PM [facility] LAB 4 [facility] None  10/02/2019 12:30 PM [facility] [place] FLUSH [facility] None  10/02/2019  1:00 PM Causey, Charlestine Massed, NP [facility] None  10/02/2019  2:00 PM [facility] INFUSION [facility] None  10/09/2019  1:15 PM [facility] LAB 3 [facility] None  10/09/2019  1:30 PM [facility] [place] FLUSH [facility] None  10/09/2019  2:00 PM Magrinat, Virgie Dad, MD [facility] None  10/09/2019  3:30 PM [facility] INFUSION [facility] None  10/16/2019 12:45 PM [facility] LAB 4 [facility] None  10/16/2019  1:00 PM [facility] [place] FLUSH [facility] None  10/16/2019  1:30 PM Causey, Charlestine Massed, NP [facility] None  10/16/2019  2:15 PM [facility] INFUSION [facility] None  10/23/2019  1:00 PM [facility] LAB 5 [facility] None  10/23/2019  1:15 PM [facility] [place] FLUSH [facility] None  10/23/2019  2:15 PM [facility] INFUSION [facility] None  10/30/2019  1:15 PM [facility] LAB 1 [facility] None  10/30/2019  1:30 PM [facility] [place] FLUSH [facility] None  10/30/2019  2:00 PM [place] Phlegm, NP [facility] None  10/30/2019  2:30 PM [facility] INFUSION [facility] None  11/06/2019  1:00 PM [facility] LAB 4 [facility] None  11/06/2019  1:15 PM [facility] [place] FLUSH [facility] None  11/06/2019  2:15 PM [facility] INFUSION [facility] None  11/13/2019 12:15 PM [facility] LAB 4 [facility] None  11/13/2019 12:30 PM [facility] [place] FLUSH  [facility] None  11/13/2019  1:00 PM Magrinat, Virgie Dad, MD [facility] None  11/13/2019  2:15 PM [facility] INFUSION [facility] None  11/20/2019  1:00 PM [facility] LAB 6 [facility] None  11/20/2019  1:15 PM [facility] [place] FLUSH [facility] None  11/20/2019  2:15 PM [facility] INFUSION [facility] None  11/27/2019 12:45 PM [facility] LAB 4 [facility] None  11/27/2019  1:00 PM [facility] [place] FLUSH [facility] None  11/27/2019  1:30 PM Causey, Charlestine Massed, NP [facility] None  11/27/2019  2:30 PM [facility] INFUSION [facility] None  12/04/2019 12:45 PM [facility] LAB 2 [facility] None  12/04/2019  1:00 PM [facility] [place] FLUSH [facility] None  12/04/2019  1:30 PM Causey, Charlestine Massed, NP [facility] None  12/04/2019  2:30 PM [facility] INFUSION [facility] None  12/11/2019 12:45 PM [facility] LAB 4 [facility] None  12/11/2019  1:00 PM [facility] [place] FLUSH [facility] None  12/11/2019  1:30 PM Causey, Charlestine Massed, NP [facility] None  12/11/2019  2:30 PM [facility] INFUSION [facility] None  12/18/2019 12:45 PM [facility] LAB 4 [facility] None  12/18/2019  1:00 PM [place] FLUSH [facility] None  12/18/2019  1:30 PM Causey, Charlestine Massed, NP [facility] None  12/18/2019  2:30 PM [facility] INFUSION [facility] None  12/25/2019 11:15 AM [facility] LAB 1 [facility] None  12/25/2019 11:30 AM [facility] [place] FLUSH [facility] None  12/25/2019 12:00 PM Magrinat, Virgie Dad, MD [facility] None  12/25/2019  1:00 PM [facility] INFUSION [facility] None    Orders Placed This Encounter  Procedures   CT ANGIO CHEST PE W OR WO CONTRAST       Subjective:   Patient ID:  BURNA ATLAS is a 48 y.o. (DOB Sep 01, 1971) female.  Chief Complaint: No chief complaint on file.   HPI MARRIE CHANDRA  Is a 48 y.o. female with a diagnosis of of an estrogen receptor positive breast cancer. She is managed by Dr. Jana Hakim and is receiving adjuvant chemotherapy consisting of doxorubicin and cyclophosphamide in dose dense  fashion x4, to be followed by weekly paclitaxel x12.  Ms. Dollinger is status post cycle 4 of Adriamycin and Cytoxan given with Neulasta support.  Her last cycle of chemotherapy was dosed on 09/24/2019.  She was scheduled to have a restaging CT scan completed on 10/01/2019 prior to beginning weekly Taxol.  She was seen by this provider after receiving cycle 3, day 1 of doxorubicin and cyclophosphamide.  She had been hospitalized from 08/28/2019 through 08/31/2019 with neutropenic fever.  Following her discharge she presented to the emergency room on 09/01/2019 with an approximate 16 pound weight gain.  She was noted to have periorbital edema, swelling of her neck, chest, and upper arms.  At that time she reported that she was having to sleep on several pillows due to shortness of breath and dyspnea on exertion.  A BNP returned at 222.0.  She was given Lasix 20 mg p.o. x3 days and potassium chloride 20 mEq p.o. twice daily for 3 days.  She was seen in follow-up on 09/05/2019 at which time she was noted to have approximately 5 pound weight loss.  She was feeling better overall and was breathing better.  She was last seen on 12/14/2020For cycle 4, day 1 of doxorubicin and cyclophosphamide with Onpro support.  Her weight had returned to her baseline of 215 pounds.  She had an echocardiogram completed on 09/19/2019 which showed an EF of 60 to 65%.  She reports noting facial erythema on Tuesday following her Onpro injection and noted facial swelling on Tuesday evening.  She contacted Dr. Virgie Dad office and was told to restart her Lasix.  She took 20 mg of Lasix on Wednesday and Thursday without any noted increase in urinary output.  She presented to the office today with continued facial, neck, chest, and upper extremity edema.  She was also noted to have extensive swelling under her eyes bilaterally.  She also reported mild shortness of breath and a nonproductive cough.  She stated that her face turned a purplish color  after she got out of the shower and bent over to pick up something off the floor.  She presented to the clinic today and was referred for a CT angiogram which returned showing the following:  Cardiovascular:  The study is low to moderate quality for the evaluation of pulmonary embolism, with significant motion degradation and suboptimal contrast opacification.  There are acute segmental pulmonary emboli in the bilateral lower lobes (series 5/image 140 on the right and image 132 on the left).  No central/saddle pulmonary emboli. Normal course and caliber of the thoracic aorta.  Normal caliber main pulmonary artery (2.5 cm diameter).  Right internal jugular Port-A-Cath terminates in the lower third of the SVC.  Low-attenuation occlusive filling defect throughout the SVC surrounding the Port-A-Cath with near occlusive filling defect surrounding the Port-A-Cath in the right brachiocephalic vein.  Extensive opacified venous collaterals throughout the mediastinum and left chest wall draining to the IVC via diaphragmatic collaterals.  Normal heart size.  No significant pericardial fluid/thickening.   Mediastinum/Nodes:  Hypodense 1.6 cm right thyroid nodule anteriorly. Unremarkable esophagus.  No pathologically enlarged axillary, mediastinal or hilar lymph nodes.   Lungs/Pleura:  No pneumothorax.  Small dependent bilateral pleural effusions, right greater than left.  There is a focus of consolidation in the peripheral right lower lobe measuring 2.9 x 1.6 cm (series 10/image 77).  Hypoventilatory changes are present both lung bases.  No additional significant pulmonary nodules on this motion degraded scan.   Upper abdomen:  Cholecystectomy.   Musculoskeletal:  No aggressive appearing focal osseous lesions.  Mild thoracic spondylosis.  Partially visualized fluid density focus in the lateral right breast measuring at least 6.0 x 5.2 cm (series 5/image 116).  Asymmetric skin thickening  throughout right breast. Subcutaneous edema throughout ventral upper chest wall.   Review of the MIP images confirms the above findings.   IMPRESSION: 1. Acute segmental bilateral lower lobe pulmonary emboli. 2. Occlusive low-attenuation filling defect throughout the SVC surrounding the right IJ Port-A-Cath with extensive opacified venous collaterals throughout the mediastinum and ventral left chest wall draining to the IVC across the diaphragm. 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 3. Peripheral right lower lobe 2.9 cm focus of consolidation, indeterminate with differential including pulmonary infarct, pneumonia or neoplasm. Short-term follow-up chest CT advised in 3 months. 4. Small dependent bilateral pleural effusions. 5. Asymmetric right breast skin thickening with subcutaneous edema throughout ventral upper chest wall. Partially visualized fluid density focus in the lateral right breast, potentially representing postsurgical fluid collection. 6. Hypodense 1.6 cm anterior right thyroid nodule. No follow-up recommended  unless clinically warranted (ref: J Am Coll Radiol. 2015 Feb;12(2): 143-50).   Medications: I have reviewed the patient's current medications.  Allergies: No Known Allergies  Past Medical History:  Diagnosis Date   Cancer ([place])    Chronic low back pain with left-sided sciatica    Family history of leukemia    Family history of stomach cancer    Hypertension     Past Surgical History:  Procedure Laterality Date   BREAST BIOPSY Right 05/02/2019   right clips X2   BREAST LUMPECTOMY WITH RADIOACTIVE SEED AND SENTINEL LYMPH NODE BIOPSY Right 06/12/2019   Procedure: RIGHT BREAST RADIOACTIVE SEED LUMPECTOMY  AND RIGHT AXILLARY SEED TARGETED LYMPH NODE AND AXILLARY SENTINEL LYMPH NODE MAPPING;  Surgeon: Erroll Luna, MD;  Location: [facility];  Service: General;  Laterality: Right;  PEC BLOCK   C/S x2  '98 '[place]  05/17/2011   Procedure: LAPAROSCOPIC ASSISTED VAGINAL HYSTERECTOMY;  Surgeon: Sharene Butters;  Location: [facility];  Service: Gynecology;  Laterality: N/A;  Laparoscopic Assisted Vaginal Hysterectomy With Lysis Of Adhesions   PORTACATH PLACEMENT Right 06/12/2019   Procedure: INSERTION PORT-A-CATH WITH ULTRASOUND;  Surgeon: Erroll Luna, MD;  Location: [facility];  Service: General;  Laterality: Right;   RE-EXCISION OF BREAST LUMPECTOMY Right 07/17/2019   Procedure: RE-EXCISION OF RIGHT BREAST LUMPECTOMY;  Surgeon: Erroll Luna, MD;  Location: [facility];  Service: General;  Laterality: Right;   TUBAL LIGATION      Family History  Problem Relation Age of Onset   Arthritis Mother    COPD Mother    Hypertension Mother    Hyperlipidemia Mother    Leukemia Brother 47   Stomach cancer Maternal Grandmother        late 42s    Social History   Socioeconomic History   Marital status: Married    Spouse name: Not on file   Number of children: Not on file   Years of education: Not on file   Highest education level: Not on file  Occupational History   Not on file  Tobacco Use   Smoking status: Never Smoker   Smokeless tobacco: Never Used  Substance and Sexual Activity   Alcohol use: Yes    Alcohol/week: 2.0 standard drinks    Types: 2 Standard drinks or equivalent per week    Comment: "2-3 a week"   Drug use: No   Sexual activity: Yes    Birth control/protection: Surgical  Other Topics Concern   Not on file  Social History Narrative   Not on file   Social Determinants of Health   Financial Resource Strain:    Difficulty of Paying Living Expenses: Not on file  Food Insecurity:    Worried About Charity fundraiser in the Last Year: Not on file   [employer] of Food in the Last Year: Not on file  Transportation Needs:    Lack of Transportation (Medical): Not on file    Lack of Transportation (Non-Medical): Not on file  Physical Activity:    Days of Exercise per Week: Not on file   Minutes of Exercise per Session: Not on file  Stress:    Feeling of Stress : Not on file  Social Connections:    Frequency of Communication with Friends and Family: Not on file   Frequency of Social Gatherings with Friends and Family: Not  on file   Attends Religious Services: Not on file   Active Member of Clubs or Organizations: Not on file   Attends Archivist Meetings: Not on file   Marital Status: Not on file  Intimate Partner Violence:    Fear of Current or Ex-Partner: Not on file   Emotionally Abused: Not on file   Physically Abused: Not on file   Sexually Abused: Not on file    Past Medical History, Surgical history, Social history, and Family history were reviewed and updated as appropriate.   Please see review of systems for further details on the patient's review from today.   Review of Systems:  Review of Systems  Constitutional: Negative for chills, diaphoresis and fever.  HENT: Positive for facial swelling. Negative for trouble swallowing.        Neck, bilateral upper extremities, chest, and facial swelling.  Respiratory: Positive for cough and shortness of breath. Negative for choking, chest tightness, wheezing and stridor.   Cardiovascular: Negative for chest pain and palpitations.  Skin:       Facial erythema  Neurological: Negative for facial asymmetry and weakness.    Objective:   Physical Exam:  BP 100/68 (BP Location: Left Arm, Patient Position: Sitting)   Pulse 100   Temp 98.3 F (36.8 C) (Temporal)   Resp 19   Ht '5\' 1"'  (1.549 m)   Wt 217 lb 8 oz (98.7 kg)   SpO2 100%   BMI 41.10 kg/m  ECOG: 1  Physical Exam Constitutional:      General: She is not in acute distress.    Appearance: She is not diaphoretic.  HENT:     Head: Normocephalic and atraumatic.   Eyes:     General: No scleral icterus.       Right eye: No  discharge.        Left eye: No discharge.  Cardiovascular:     Rate and Rhythm: Normal rate and regular rhythm.     Heart sounds: Normal heart sounds. No murmur. No friction rub. No gallop.   Pulmonary:     Effort: Pulmonary effort is normal. No respiratory distress.     Breath sounds: Normal breath sounds. No stridor. No wheezing or rales.  Musculoskeletal:     Right lower leg: No edema.     Left lower leg: No edema.  Skin:    General: Skin is warm and dry.     Coloration: Skin is not pale.     Findings: No erythema.  Neurological:     Mental Status: She is alert.     Coordination: Coordination normal.     Gait: Gait normal.  Psychiatric:        Behavior: Behavior normal.        Thought Content: Thought content normal.        Judgment: Judgment normal.     Lab Review:     Component Value Date/Time   NA 138 09/24/2019 1005   K 3.6 09/24/2019 1005   CL 106 09/24/2019 1005   CO2 22 09/24/2019 1005   GLUCOSE 127 (H) 09/24/2019 1005   BUN 7 09/24/2019 1005   CREATININE 0.64 09/24/2019 1005   CREATININE 0.71 09/05/2019 1030   CALCIUM 9.1 09/24/2019 1005   PROT 6.4 (L) 09/24/2019 1005   ALBUMIN 3.7 09/24/2019 1005   AST 28 09/24/2019 1005   AST 18 05/09/2019 1225   ALT 34 09/24/2019 1005   ALT 23 05/09/2019 1225   ALKPHOS 69 09/24/2019  1005   BILITOT 0.9 09/24/2019 1005   BILITOT 0.7 05/09/2019 1225   GFRNONAA >60 09/24/2019 1005   GFRNONAA >60 09/05/2019 1030   GFRAA >60 09/24/2019 1005   GFRAA >60 09/05/2019 1030       Component Value Date/Time   WBC 6.5 09/24/2019 1005   RBC 3.68 (L) 09/24/2019 1005   HGB 11.0 (L) 09/24/2019 1005   HGB 14.8 05/09/2019 1225   HCT 33.6 (L) 09/24/2019 1005   PLT 178 09/24/2019 1005   PLT 210 05/09/2019 1225   MCV 91.3 09/24/2019 1005   MCH 29.9 09/24/2019 1005   MCHC 32.7 09/24/2019 1005   RDW 15.9 (H) 09/24/2019 1005   LYMPHSABS 0.8 09/24/2019 1005   MONOABS 0.6 09/24/2019 1005   EOSABS 0.2 09/24/2019 1005   BASOSABS  0.0 09/24/2019 1005   -------------------------------  Imaging from last 24 hours (if applicable):  Radiology interpretation: DG Chest 1 View  Result Date: 08/30/2019 CLINICAL DATA:  Cough.  History of breast cancer. EXAM: CHEST  1 VIEW COMPARISON:  08/28/2019 FINDINGS: Stable position of the right jugular Port-A-Cath with the tip in the lower SVC. Densities in the right lower chest appear to be related to overlying breast tissue. No focal airspace disease. No evidence for pulmonary edema. Heart and mediastinum are within normal limits. Trachea is midline. IMPRESSION: No acute cardiopulmonary disease. Electronically Signed   By: Markus Daft M.D.   On: 08/30/2019 09:42   DG Chest 2 View  Result Date: 09/24/2019 CLINICAL DATA:  Breast cancer.  Recent completion of chemotherapy. EXAM: CHEST - 2 VIEW COMPARISON:  09/01/2019 chest radiograph. FINDINGS: Right internal jugular Port-A-Cath terminates at the cavoatrial junction. Stable cardiomediastinal silhouette with normal heart size. No pneumothorax. Resolved pleural effusions. Persistent patchy peripheral lower right lung opacity. Otherwise clear lungs. IMPRESSION: 1. Resolved pleural effusions. 2. Persistent patchy peripheral lower right lung opacity, indeterminate for infectious/inflammatory process or loculated pleural fluid in the fissure, with neoplasm not excluded. Chest CT suggested for further evaluation. Electronically Signed   By: Ilona Sorrel M.D.   On: 09/24/2019 17:40   DG Chest 2 View  Result Date: 09/01/2019 CLINICAL DATA:  Chest pain EXAM: CHEST - 2 VIEW COMPARISON:  August 30, 2019 FINDINGS: There is a well-positioned right-sided Port-A-Cath. There are small bilateral pleural effusions, right greater than left. There is an airspace opacity at the right lung base with new perihilar linear airspace opacities on the right. There is no pneumothorax. No large pleural effusion. No acute osseous abnormality. IMPRESSION: 1. Small bilateral  pleural effusions, increased in size from prior study. 2. Developing opacity at the right lung base of unknown clinical significance. This could represent atelectasis or an infiltrate. A mass is not excluded. Follow-up to radiologic resolution is recommended. 3. Bibasilar airspace opacities favored to represent atelectasis with an infiltrate not excluded. Electronically Signed   By: Constance Holster M.D.   On: 09/01/2019 18:40   CT ANGIO CHEST PE W OR WO CONTRAST  Result Date: 09/28/2019 CLINICAL DATA:  Right breast cancer with ongoing chemotherapy, presenting with dyspnea and facial, neck and right upper extremity swelling. EXAM: CT ANGIOGRAPHY CHEST WITH CONTRAST TECHNIQUE: Multidetector CT imaging of the chest was performed using the standard protocol during bolus administration of intravenous contrast. Multiplanar CT image reconstructions and MIPs were obtained to evaluate the vascular anatomy. CONTRAST:  176m OMNIPAQUE IOHEXOL 350 MG/ML SOLN COMPARISON:  09/24/2019 chest radiograph. FINDINGS: Cardiovascular: The study is low to moderate quality for the evaluation of pulmonary embolism,  with significant motion degradation and suboptimal contrast opacification. There are acute segmental pulmonary emboli in the bilateral lower lobes (series 5/image 140 on the right and image 132 on the left). No central/saddle pulmonary emboli. Normal course and caliber of the thoracic aorta. Normal caliber main pulmonary artery (2.5 cm diameter). Right internal jugular Port-A-Cath terminates in the lower third of the SVC. Low-attenuation occlusive filling defect throughout the SVC surrounding the Port-A-Cath with near occlusive filling defect surrounding the Port-A-Cath in the right brachiocephalic vein. Extensive opacified venous collaterals throughout the mediastinum and left chest wall draining to the IVC via diaphragmatic collaterals. Normal heart size. No significant pericardial fluid/thickening. Mediastinum/Nodes:  Hypodense 1.6 cm right thyroid nodule anteriorly. Unremarkable esophagus. No pathologically enlarged axillary, mediastinal or hilar lymph nodes. Lungs/Pleura: No pneumothorax. Small dependent bilateral pleural effusions, right greater than left. There is a focus of consolidation in the peripheral right lower lobe measuring 2.9 x 1.6 cm (series 10/image 77). Hypoventilatory changes are present both lung bases. No additional significant pulmonary nodules on this motion degraded scan. Upper abdomen: Cholecystectomy. Musculoskeletal: No aggressive appearing focal osseous lesions. Mild thoracic spondylosis. Partially visualized fluid density focus in the lateral right breast measuring at least 6.0 x 5.2 cm (series 5/image 116). Asymmetric skin thickening throughout right breast. Subcutaneous edema throughout ventral upper chest wall. Review of the MIP images confirms the above findings. IMPRESSION: 1. Acute segmental bilateral lower lobe pulmonary emboli. 2. Occlusive low-attenuation filling defect throughout the SVC surrounding the right IJ Port-A-Cath with extensive opacified venous collaterals throughout the mediastinum and ventral left chest wall draining to the IVC across the diaphragm. 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 3. Peripheral right lower lobe 2.9 cm focus of consolidation, indeterminate with differential including pulmonary infarct, pneumonia or neoplasm. Short-term follow-up chest CT advised in 3 months. 4. Small dependent bilateral pleural effusions. 5. Asymmetric right breast skin thickening with subcutaneous edema throughout ventral upper chest wall. Partially visualized fluid density focus in the lateral right breast, potentially representing postsurgical fluid collection. 6. Hypodense 1.6 cm anterior right thyroid nodule. No follow-up recommended unless clinically warranted (ref: J Am Coll Radiol. 2015 Feb;12(2): 143-50). Critical Value/emergent results  were called by telephone at the time of interpretation on 09/28/2019 at 4:09 pm to provider Sandi Mealy , who verbally acknowledged these results. Electronically Signed   By: Ilona Sorrel M.D.   On: 09/28/2019 16:11   ECHOCARDIOGRAM COMPLETE  Result Date: 09/19/2019   ECHOCARDIOGRAM REPORT   Patient Name:   NEKISHA MCDIARMID Date of Exam: 09/19/2019 Medical Rec #:  [id number]      Height:       61.0 in Accession #:    [id number]     Weight:       215.8 lb Date of Birth:  26-Feb-1971      BSA:          1.95 m Patient Age:    43 years       BP:           114/62 mmHg Patient Gender: F              HR:           102 bpm. Exam Location:  Outpatient Procedure: 2D Echo, Cardiac Doppler and Color Doppler Indications:    Chemo evaluation  History:        Patient has prior history of Echocardiogram examinations, most  recent 06/26/2019. Breast cancer, chemo.  Sonographer:    Dustin Flock Referring Phys: [place]  1. Left ventricular ejection fraction, by visual estimation, is 60 to 65%. The left ventricle has normal function. There is no left ventricular hypertrophy.  2. Left ventricular diastolic function could not be evaluated.  3. The left ventricle has no regional wall motion abnormalities.  4. Global right ventricle has normal systolic function.The right ventricular size is normal. No increase in right ventricular wall thickness.  5. Left atrial size was normal.  6. Right atrial size was normal.  7. The mitral valve is normal in structure. No evidence of mitral valve regurgitation. No evidence of mitral stenosis.  8. The tricuspid valve is normal in structure. Tricuspid valve regurgitation is trivial.  9. The aortic valve is normal in structure. Aortic valve regurgitation is not visualized. No evidence of aortic valve sclerosis or stenosis. 10. The pulmonic valve was normal in structure. Pulmonic valve regurgitation is not visualized. 11. Normal pulmonary artery systolic  pressure. 12. The inferior vena cava is normal in size with greater than 50% respiratory variability, suggesting right atrial pressure of 3 mmHg. 13. The average left ventricular global longitudinal strain is -13.5 %. In comparison to the previous echocardiogram(s): LV longitudinal strain is mildly abnormal and has decreased since September 2020. FINDINGS  Left Ventricle: Left ventricular ejection fraction, by visual estimation, is 60 to 65%. The left ventricle has normal function. The average left ventricular global longitudinal strain is -13.5 %. The left ventricle has no regional wall motion abnormalities. There is no left ventricular hypertrophy. Left ventricular diastolic function could not be evaluated. Normal left atrial pressure. Right Ventricle: The right ventricular size is normal. No increase in right ventricular wall thickness. Global RV systolic function is has normal systolic function. The tricuspid regurgitant velocity is 2.13 m/s, and with an assumed right atrial pressure  of 3 mmHg, the estimated right ventricular systolic pressure is normal at 21.1 mmHg. Left Atrium: Left atrial size was normal in size. Right Atrium: Right atrial size was normal in size Pericardium: There is no evidence of pericardial effusion. Mitral Valve: The mitral valve is normal in structure. No evidence of mitral valve regurgitation. No evidence of mitral valve stenosis by observation. Tricuspid Valve: The tricuspid valve is normal in structure. Tricuspid valve regurgitation is trivial. Aortic Valve: The aortic valve is normal in structure. Aortic valve regurgitation is not visualized. The aortic valve is structurally normal, with no evidence of sclerosis or stenosis. Pulmonic Valve: The pulmonic valve was normal in structure. Pulmonic valve regurgitation is not visualized. Pulmonic regurgitation is not visualized. Aorta: The aortic root, ascending aorta and aortic arch are all structurally normal, with no evidence of  dilitation or obstruction. Venous: The inferior vena cava is normal in size with greater than 50% respiratory variability, suggesting right atrial pressure of 3 mmHg. IAS/Shunts: No atrial level shunt detected by color flow Doppler. There is no evidence of a patent foramen ovale. No ventricular septal defect is seen or detected. There is no evidence of an atrial septal defect.  LEFT VENTRICLE PLAX 2D LVIDd:         3.50 cm  Diastology LVIDs:         2.60 cm  LV e' lateral: 9.03 cm/s LV PW:         1.10 cm  LV e' medial:  7.40 cm/s LV IVS:        1.00 cm LVOT diam:  1.90 cm  2D Longitudinal Strain LV SV:         26 ml    2D Strain GLS Avg:     -13.5 % LV SV Index:   12.46 LVOT Area:     2.84 cm  RIGHT VENTRICLE RV Basal diam:  2.10 cm RV S prime:     16.50 cm/s TAPSE (M-mode): 2.8 cm LEFT ATRIUM             Index       RIGHT ATRIUM          Index LA diam:        3.20 cm 1.64 cm/m  RA Area:     6.40 cm LA Vol (A2C):   19.7 ml 10.10 ml/m RA Volume:   8.50 ml  4.36 ml/m LA Vol (A4C):   31.6 ml 16.20 ml/m LA Biplane Vol: 25.4 ml 13.02 ml/m  AORTIC VALVE LVOT Vmax:   107.00 cm/s LVOT Vmean:  71.600 cm/s LVOT VTI:    0.173 m  AORTA Ao Root diam: 2.50 cm TRICUSPID VALVE TR Peak grad:   18.1 mmHg TR Vmax:        228.00 cm/s  SHUNTS Systemic VTI:  0.17 m Systemic Diam: 1.90 cm  Dani Gobble Croitoru MD Electronically signed by Sanda Klein MD Signature Date/Time: 09/19/2019/4:16:18 PM    Final         This patient was seen with Dr. Jana Hakim with my treatment plan reviewed with him. He expressed agreement with my medical management of this patient.   ADDENDUM: Saharah's upper body swelling is due not to her pulmonary emboly but to an extensive clot involving the SVC, related to her port. She will be anticoagulated but will likely need thrombectomy to resolve the SVC syndrome symptoms. Likely the port will have to be sacrificed.  We are postponing her chemotherapy until this is resolved. Luckily she was at a treatment  break so delays will be minimal. When she resumes chemo she will start weekly taxol. She has a good understanding of the possible toxicities, side effects and complications involved.  I personally saw this patient and performed a substantive portion of this encounter with the listed APP documented above.   Chauncey Cruel, MD Medical Oncology and Hematology [facility] [address] [place], [place] [address] Tel. [phone]    Fax. [phone]

## 2019-09-28 NOTE — ED Triage Notes (Addendum)
Pt arrives from cancer center: dx of bilateral PE's and complete occlusion of SVC from CT today. Pt has edema to chest, face, bilateral upper extremities. Pt reports minimal SHOB. Pt COVID negative on Wednesday

## 2019-09-28 NOTE — H&P (Signed)
History and Physical    Veronica [name] [id number] DOB: 05/03/1971 DOA: 09/28/2019  PCP: Blair Heys, PA-C  Patient coming from: Home  I have personally briefly reviewed patient's old medical records in [place]  Chief Complaint:  1.  Facial swelling and upper extremity swelling 2. Patient was referred for management of pulmonary embolism and superior vena cava occlusion  HPI: Veronica [name] is a 48 y.o. female with medical history significant of notable for breast cancer currently on chemotherapy.  She has been at her baseline until about 3 days ago when she noticed increased facial and neck swelling associated with redness.  She also reported of bilateral upper extremity swelling especially right upper extremity.  There was no associated pain.  She denies chest pain, shortness of breath, stridor, respiratory distress, fever, nausea or vomiting.  Based on her worsening symptoms, she was seen by her provider who ordered CTA of the chest.  Based on findings of the chest CTA which was notable for bilateral pulmonary embolism and occlusion of SVC, she was referred to ED for further management.   She complained of intermittent 4/10 dull headache with no specific relieving or aggravating factor.  She denies diarrhea, fever, cough, loss of consciousness, polyuria, polydipsia, pruritus or chest pains.  ED Course: She was hemodynamically stable at the ED and afebrile.  She was saturating 100% on room air.  Patient was started on heparin drip.  I discussed case with vascular surgeon Dr. Trula Slade who agreed with continuation of heparin drip for now and he will evaluate patient for any additional intervention.  Review of Systems: As per HPI otherwise 10 point review of systems negative.   Past Medical History:  Diagnosis Date  . Cancer ([place])   . Chronic low back pain with left-sided sciatica   . Family history of leukemia   . Family history of stomach cancer   . Hypertension     Past  Surgical History:  Procedure Laterality Date  . BREAST BIOPSY Right 05/02/2019   right clips X2  . BREAST LUMPECTOMY WITH RADIOACTIVE SEED AND SENTINEL LYMPH NODE BIOPSY Right 06/12/2019   Procedure: RIGHT BREAST RADIOACTIVE SEED LUMPECTOMY  AND RIGHT AXILLARY SEED TARGETED LYMPH NODE AND AXILLARY SENTINEL LYMPH NODE MAPPING;  Surgeon: Erroll Luna, MD;  Location: [facility];  Service: General;  Laterality: Right;  PEC BLOCK  . C/S x2  '98 '03  . CESAREAN SECTION    . CHOLECYSTECTOMY    . LAPAROSCOPIC ASSISTED VAGINAL HYSTERECTOMY  05/17/2011   Procedure: LAPAROSCOPIC ASSISTED VAGINAL HYSTERECTOMY;  Surgeon: Sharene Butters;  Location: [facility];  Service: Gynecology;  Laterality: N/A;  Laparoscopic Assisted Vaginal Hysterectomy With Lysis Of Adhesions  . PORTACATH PLACEMENT Right 06/12/2019   Procedure: INSERTION PORT-A-CATH WITH ULTRASOUND;  Surgeon: Erroll Luna, MD;  Location: [facility];  Service: General;  Laterality: Right;  . RE-EXCISION OF BREAST LUMPECTOMY Right 07/17/2019   Procedure: RE-EXCISION OF RIGHT BREAST LUMPECTOMY;  Surgeon: Erroll Luna, MD;  Location: [facility];  Service: General;  Laterality: Right;  . TUBAL LIGATION       reports that she has never smoked. She has never used smokeless tobacco. She reports current alcohol use of about 2.0 standard drinks of alcohol per week. She reports that she does not use drugs.  No Known Allergies  Family History  Problem Relation Age of Onset  . Arthritis Mother   . COPD Mother   . Hypertension Mother   .  Hyperlipidemia Mother   . Leukemia Brother 33  . Stomach cancer Maternal Grandmother        late 27s   Negative for  DVT.  Maternal history of hypertension   Prior to Admission medications   Medication Sig Start Date End Date Taking? Authorizing Provider  acetaminophen (TYLENOL) 500 MG tablet Take 500 mg by mouth every 6 (six) hours as needed for mild pain.    Yes  [provider]  Cholecalciferol (VITAMIN D3) 25 MCG (1000 UT) CHEW Chew 1 tablet by mouth daily.    Yes [provider]  dexamethasone (DECADRON) 4 MG tablet TAKE TWO TABLETS BY MOUTH ONCE A DAY ON THE DAY AFTER CHEMOTHERAPY AND THEN TAKE TWO TABLETS BY MOUTH TWICE DAILY FOR 2 DAYS Patient taking differently: Take 8 mg by mouth See admin instructions. TAKE 8mg  BY MOUTH ONCE A DAY ON THE DAY AFTER CHEMOTHERAPY AND THEN TAKE 8mg   BY MOUTH TWICE DAILY FOR 2 DAYS 08/22/19  Yes Magrinat, Virgie Dad, MD  famotidine (PEPCID) 10 MG tablet Take 10 mg by mouth 2 (two) times daily.   Yes [provider]  furosemide (LASIX) 20 MG tablet Take 1 tablet (20 mg total) by mouth daily for 3 days. Patient taking differently: Take 20 mg by mouth daily. Only when instructed by MD 09/01/19 09/28/19 Yes Sherwood Gambler, MD  lidocaine-prilocaine (EMLA) cream Apply to affected area once 06/21/19  Yes Magrinat, Virgie Dad, MD  loratadine (CLARITIN) 10 MG tablet Take 10 mg by mouth See admin instructions. Takes for 5 days after chemo   Yes [provider]  LORazepam (ATIVAN) 0.5 MG tablet Take 1 tablet (0.5 mg total) by mouth at bedtime as needed (Nausea or vomiting). 06/21/19  Yes Magrinat, Virgie Dad, MD  olmesartan-hydrochlorothiazide (BENICAR HCT) 20-12.5 MG tablet Take 1 tablet by mouth daily.   Yes [provider]  ondansetron (ZOFRAN) 8 MG tablet Take 1 tablet (8 mg total) by mouth every 8 (eight) hours as needed for nausea or vomiting. 08/21/19  Yes Magrinat, Virgie Dad, MD  potassium chloride SA (KLOR-CON) 20 MEQ tablet Take 2 tablets (40 mEq total) by mouth 2 (two) times daily for 3 days. Patient taking differently: Take 40 mEq by mouth daily. Only when instructed by MD 09/01/19 09/28/19 Yes Sherwood Gambler, MD  prochlorperazine (COMPAZINE) 10 MG tablet Take 1 tablet (10 mg total) by mouth every 6 (six) hours as needed (Nausea or vomiting). 06/21/19  Yes Magrinat, Virgie Dad, MD     Physical Exam: Vitals:   09/28/19 1900 09/28/19 2000 09/28/19 2100 09/28/19 2130  BP: (!) 116/51 (!) 94/56 100/65 101/61  Pulse: 95 (!) 103 97 (!) 105  Resp: 20 (!) 21 20 19   Temp:      TempSrc:      SpO2: 100% 100% 100% 100%  Weight:      Height:        Constitutional: NAD, calm, comfortable Vitals:   09/28/19 1900 09/28/19 2000 09/28/19 2100 09/28/19 2130  BP: (!) 116/51 (!) 94/56 100/65 101/61  Pulse: 95 (!) 103 97 (!) 105  Resp: 20 (!) 21 20 19   Temp:      TempSrc:      SpO2: 100% 100% 100% 100%  Weight:      Height:       Eyes: PERRL, lids and conjunctivae normal ENMT: Mucous membranes are moist. Posterior pharynx clear of any exudate or lesions.Normal dentition.  There was erythema of the face with facial swelling Neck:  Facial and neck swelling, supple, no masses, no thyromegaly Respiratory: clear to auscultation bilaterally, no wheezing, no crackles. Normal respiratory effort. No accessory muscle use.  Cardiovascular: Regular rate and rhythm, no murmurs / rubs / gallops. No extremity edema. 2+ pedal pulses. No carotid bruits.  Abdomen: no tenderness, no masses palpated. No hepatosplenomegaly. Bowel sounds positive.  Musculoskeletal: no clubbing / cyanosis. No joint deformity upper and lower extremities. Good ROM, no contractures. Normal muscle tone.  There was bilateral upper extremity swelling right greater than left Skin: no rashes, lesions, ulcers. No induration.  There was erythema of the face Neurologic: CN 2-12 grossly intact. Sensation intact, DTR normal. Strength 5/5 in all 4.  Psychiatric: Normal judgment and insight. Alert and oriented x 3. Normal mood.    Labs on Admission: I have personally reviewed following labs and imaging studies  CBC: Recent Labs  Lab 09/24/19 1005 09/28/19 1652  WBC 6.5 28.3*  NEUTROABS 4.9 27.1*  HGB 11.0* 11.1*  HCT 33.6* 34.7*  MCV 91.3 94.0  PLT 178 69*   Basic Metabolic Panel: Recent Labs  Lab 09/24/19 1005  09/28/19 1652  NA 138 136  K 3.6 3.3*  CL 106 100  CO2 22 21*  GLUCOSE 127* 171*  BUN 7 24*  CREATININE 0.64 0.79  CALCIUM 9.1 9.6   GFR: Estimated Creatinine Clearance: 92.9 mL/min (by C-G formula based on SCr of 0.79 mg/dL). Liver Function Tests: Recent Labs  Lab 09/24/19 1005  AST 28  ALT 34  ALKPHOS 69  BILITOT 0.9  PROT 6.4*  ALBUMIN 3.7   No results for input(s): LIPASE, AMYLASE in the last 168 hours. No results for input(s): AMMONIA in the last 168 hours. Coagulation Profile: No results for input(s): INR, PROTIME in the last 168 hours. Cardiac Enzymes: No results for input(s): CKTOTAL, CKMB, CKMBINDEX, TROPONINI in the last 168 hours. BNP (last 3 results) No results for input(s): PROBNP in the last 8760 hours. HbA1C: No results for input(s): HGBA1C in the last 72 hours. CBG: No results for input(s): GLUCAP in the last 168 hours. Lipid Profile: No results for input(s): CHOL, HDL, LDLCALC, TRIG, CHOLHDL, LDLDIRECT in the last 72 hours. Thyroid Function Tests: No results for input(s): TSH, T4TOTAL, FREET4, T3FREE, THYROIDAB in the last 72 hours. Anemia Panel: No results for input(s): VITAMINB12, FOLATE, FERRITIN, TIBC, IRON, RETICCTPCT in the last 72 hours. Urine analysis:    Component Value Date/Time   COLORURINE YELLOW 08/28/2019 2326   APPEARANCEUR CLOUDY (A) 08/28/2019 2326   LABSPEC 1.013 08/28/2019 2326   PHURINE 5.0 08/28/2019 2326   GLUCOSEU NEGATIVE 08/28/2019 2326   HGBUR MODERATE (A) 08/28/2019 2326   BILIRUBINUR NEGATIVE 08/28/2019 2326   KETONESUR NEGATIVE 08/28/2019 2326   PROTEINUR NEGATIVE 08/28/2019 2326   NITRITE NEGATIVE 08/28/2019 2326   LEUKOCYTESUR MODERATE (A) 08/28/2019 2326    Radiological Exams on Admission: CT Head Wo Contrast  Result Date: 09/28/2019 CLINICAL DATA:  History of breast cancer. Surveillance prior to initiation of anticoagulant therapy for pulmonary embolism. EXAM: CT HEAD WITHOUT CONTRAST TECHNIQUE: Contiguous  axial images were obtained from the base of the skull through the vertex without intravenous contrast. COMPARISON:  None. FINDINGS: Brain: There is no evidence of acute intracranial hemorrhage, mass lesion, brain edema or extra-axial fluid collection. The ventricles and subarachnoid spaces are appropriately sized for age. There is no CT evidence of acute cortical infarction. Vascular:  No hyperdense vessel identified. Skull: Negative for fracture or focal lesion. Sinuses/Orbits: There is a small right mastoid  effusion without coalescence. The visualized paranasal sinuses, left mastoid air cells and middle ears are clear. Other: None. IMPRESSION: No acute intracranial or calvarial findings. No evidence of metastatic disease. Small right mastoid effusion. Electronically Signed   By: Richardean Sale M.D.   On: 09/28/2019 18:47   CT ANGIO CHEST PE W OR WO CONTRAST  Result Date: 09/28/2019 CLINICAL DATA:  Right breast cancer with ongoing chemotherapy, presenting with dyspnea and facial, neck and right upper extremity swelling. EXAM: CT ANGIOGRAPHY CHEST WITH CONTRAST TECHNIQUE: Multidetector CT imaging of the chest was performed using the standard protocol during bolus administration of intravenous contrast. Multiplanar CT image reconstructions and MIPs were obtained to evaluate the vascular anatomy. CONTRAST:  136mL OMNIPAQUE IOHEXOL 350 MG/ML SOLN COMPARISON:  09/24/2019 chest radiograph. FINDINGS: Cardiovascular: The study is low to moderate quality for the evaluation of pulmonary embolism, with significant motion degradation and suboptimal contrast opacification. There are acute segmental pulmonary emboli in the bilateral lower lobes (series 5/image 140 on the right and image 132 on the left). No central/saddle pulmonary emboli. Normal course and caliber of the thoracic aorta. Normal caliber main pulmonary artery (2.5 cm diameter). Right internal jugular Port-A-Cath terminates in the lower third of the SVC.  Low-attenuation occlusive filling defect throughout the SVC surrounding the Port-A-Cath with near occlusive filling defect surrounding the Port-A-Cath in the right brachiocephalic vein. Extensive opacified venous collaterals throughout the mediastinum and left chest wall draining to the IVC via diaphragmatic collaterals. Normal heart size. No significant pericardial fluid/thickening. Mediastinum/Nodes: Hypodense 1.6 cm right thyroid nodule anteriorly. Unremarkable esophagus. No pathologically enlarged axillary, mediastinal or hilar lymph nodes. Lungs/Pleura: No pneumothorax. Small dependent bilateral pleural effusions, right greater than left. There is a focus of consolidation in the peripheral right lower lobe measuring 2.9 x 1.6 cm (series 10/image 77). Hypoventilatory changes are present both lung bases. No additional significant pulmonary nodules on this motion degraded scan. Upper abdomen: Cholecystectomy. Musculoskeletal: No aggressive appearing focal osseous lesions. Mild thoracic spondylosis. Partially visualized fluid density focus in the lateral right breast measuring at least 6.0 x 5.2 cm (series 5/image 116). Asymmetric skin thickening throughout right breast. Subcutaneous edema throughout ventral upper chest wall. Review of the MIP images confirms the above findings. IMPRESSION: 1. Acute segmental bilateral lower lobe pulmonary emboli. 2. Occlusive low-attenuation filling defect throughout the SVC surrounding the right IJ Port-A-Cath with extensive opacified venous collaterals throughout the mediastinum and ventral left chest wall draining to the IVC across the diaphragm. 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 3. Peripheral right lower lobe 2.9 cm focus of consolidation, indeterminate with differential including pulmonary infarct, pneumonia or neoplasm. Short-term follow-up chest CT advised in 3 months. 4. Small dependent bilateral pleural effusions. 5.  Asymmetric right breast skin thickening with subcutaneous edema throughout ventral upper chest wall. Partially visualized fluid density focus in the lateral right breast, potentially representing postsurgical fluid collection. 6. Hypodense 1.6 cm anterior right thyroid nodule. No follow-up recommended unless clinically warranted (ref: J Am Coll Radiol. 2015 Feb;12(2): 143-50). Critical Value/emergent results were called by telephone at the time of interpretation on 09/28/2019 at 4:09 pm to provider Sandi Mealy , who verbally acknowledged these results. Electronically Signed   By: Ilona Sorrel M.D.   On: 09/28/2019 16:11    EKG: Independently reviewed.  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  Superior vena cava thrombosis with occlusion: I discussed case with vascular surgeon Dr. Trula Slade who will evaluate patient on consult for possible intervention.  No indication for emergent intervention  at this time.  Continue heparin drip.  Recommendations as per vascular surgery  2.  Bilateral pulmonary embolism: Continue anticoagulation with heparin drip.  Supplemental oxygen as needed.  Monitor clinical progress of patient closely.  We will also obtain bilateral duplex ultrasound  3. Breast cancer: Currently on chemotherapy with last session on 4 days ago.  Oncology service will be consulted in the morning.  4.  Hypertension: Monitor blood pressure profile.  Continue olmesartan  DVT prophylaxis: On heparin drip Code Status: Full code  Family Communication: Diagnosis and management plan discussed at length with patient who is in agreement with plan  Disposition Plan: Patient to be discharged home when medically  stable Consults called: Vascular surgery Dr. Trula Slade.  Please consult oncology in the morning. Admission status: Inpatient to intermediate care unit    Phineas Semen MD [facility] Pager [phone]  If 7PM-7AM, please contact night-coverage [email or web address] Password [email or web address]  09/28/2019, 9:59 PM

## 2019-09-29 DIAGNOSIS — I871 Compression of vein: Secondary | ICD-10-CM | POA: Diagnosis present

## 2019-09-29 DIAGNOSIS — I2694 Multiple subsegmental pulmonary emboli without acute cor pulmonale: Principal | ICD-10-CM

## 2019-09-29 DIAGNOSIS — U071 COVID-19: Secondary | ICD-10-CM | POA: Insufficient documentation

## 2019-09-29 LAB — COMPREHENSIVE METABOLIC PANEL
ALT: 34 U/L (ref 0–44)
AST: 23 U/L (ref 15–41)
Albumin: 3.7 g/dL (ref 3.5–5.0)
Alkaline Phosphatase: 89 U/L (ref 38–126)
Anion gap: 13 (ref 5–15)
BUN: 23 mg/dL — ABNORMAL HIGH (ref 6–20)
CO2: 22 mmol/L (ref 22–32)
Calcium: 9.5 mg/dL (ref 8.9–10.3)
Chloride: 102 mmol/L (ref 98–111)
Creatinine, Ser: 0.78 mg/dL (ref 0.44–1.00)
GFR calc Af Amer: >60 mL/min (ref >60)
GFR calc non Af Amer: >60 mL/min (ref >60)
Glucose, Bld: 148 mg/dL — ABNORMAL HIGH (ref 70–99)
Potassium: 3.7 mmol/L (ref 3.5–5.1)
Sodium: 137 mmol/L (ref 135–145)
Total Bilirubin: 1.4 mg/dL — ABNORMAL HIGH (ref 0.3–1.2)
Total Protein: 6.1 g/dL — ABNORMAL LOW (ref 6.5–8.1)

## 2019-09-29 LAB — PROTIME-INR
INR: 1 (ref 0.8–1.2)
Prothrombin Time: 12.9 seconds (ref 11.4–15.2)

## 2019-09-29 LAB — CBC
HCT: 32.6 % — ABNORMAL LOW (ref 36.0–46.0)
Hemoglobin: 10.5 g/dL — ABNORMAL LOW (ref 12.0–15.0)
MCH: 30.3 pg (ref 26.0–34.0)
MCHC: 32.2 g/dL (ref 30.0–36.0)
MCV: 94.2 fL (ref 80.0–100.0)
Platelets: 54 10*3/uL — ABNORMAL LOW (ref 150–400)
RBC: 3.46 MIL/uL — ABNORMAL LOW (ref 3.87–5.11)
RDW: 15.1 % (ref 11.5–15.5)
WBC: 14.6 10*3/uL — ABNORMAL HIGH (ref 4.0–10.5)
nRBC: 0 % (ref 0.0–0.2)

## 2019-09-29 LAB — SARS CORONAVIRUS 2 (TAT 6-24 HRS): SARS Coronavirus 2: POSITIVE — AB

## 2019-09-29 LAB — HEPARIN LEVEL (UNFRACTIONATED)
Heparin Unfractionated: <0.1 IU/mL — ABNORMAL LOW (ref 0.30–0.70)
Heparin Unfractionated: 0.27 IU/mL — ABNORMAL LOW (ref 0.30–0.70)
Heparin Unfractionated: 0.44 IU/mL (ref 0.30–0.70)

## 2019-09-29 MED ORDER — HEPARIN (PORCINE) 25000 UT/250ML-% IV SOLN
1500.0000 [IU]/h | INTRAVENOUS | Status: DC
  Administered 2019-09-29 – 2019-09-30 (×2): 1500 [IU]/h via INTRAVENOUS
  Filled 2019-09-29: qty 250

## 2019-09-29 MED ORDER — FAMOTIDINE 20 MG PO TABS
10.0000 mg | ORAL_TABLET | Freq: Two times a day (BID) | ORAL | Status: DC
  Administered 2019-09-29 – 2019-10-01 (×6): 10 mg via ORAL
  Filled 2019-09-29 (×7): qty 1

## 2019-09-29 MED ORDER — ACETAMINOPHEN 325 MG PO TABS
650.0000 mg | ORAL_TABLET | Freq: Four times a day (QID) | ORAL | Status: DC | PRN
  Administered 2019-10-01 – 2019-10-05 (×3): 650 mg via ORAL
  Filled 2019-09-29 (×3): qty 2

## 2019-09-29 MED ORDER — ACETAMINOPHEN 650 MG RE SUPP: 650.0000 mg | Freq: Four times a day (QID) | RECTAL | Status: DC | PRN

## 2019-09-29 MED ORDER — POLYETHYLENE GLYCOL 3350 17 G PO PACK: 17.0000 g | PACK | Freq: Every day | ORAL | Status: DC | PRN

## 2019-09-29 MED ORDER — HYDROCHLOROTHIAZIDE 12.5 MG PO CAPS
12.5000 mg | ORAL_CAPSULE | Freq: Every day | ORAL | Status: DC
  Administered 2019-09-29: 12.5 mg via ORAL
  Filled 2019-09-29: qty 1

## 2019-09-29 MED ORDER — LORATADINE 10 MG PO TABS: 10.0000 mg | ORAL_TABLET | ORAL | Status: DC

## 2019-09-29 MED ORDER — OLMESARTAN MEDOXOMIL-HCTZ 20-12.5 MG PO TABS: 1.0000 | ORAL_TABLET | Freq: Every day | ORAL | Status: DC

## 2019-09-29 MED ORDER — IRBESARTAN 150 MG PO TABS
150.0000 mg | ORAL_TABLET | Freq: Every day | ORAL | Status: DC
  Administered 2019-09-29: 150 mg via ORAL
  Filled 2019-09-29: qty 1

## 2019-09-29 MED ORDER — HEPARIN BOLUS VIA INFUSION
1500.0000 [IU] | Freq: Once | INTRAVENOUS | Status: AC
  Administered 2019-09-29: 04:00:00 1500 [IU] via INTRAVENOUS
  Filled 2019-09-29: qty 1500

## 2019-09-29 NOTE — ED Notes (Signed)
Pt assisted to bedside toilet. Pt steady with transferring independently with stand by assistance.

## 2019-09-29 NOTE — ED Notes (Signed)
Pharmacy called regarding heparin rate and advise to leave at current rate of 15.

## 2019-09-29 NOTE — Progress Notes (Signed)
ANTICOAGULATION CONSULT NOTE - Follow Up Consult  Pharmacy Consult for Heparin Indication: pulmonary embolus & SVC syndrome  No Known Allergies  Patient Measurements: Height: 5\' 1"  (154.9 cm) Weight: 211 lb (95.7 kg) IBW/kg (Calculated) : 47.8 Heparin Dosing Weight:   Vital Signs: Temp: 97.9 F (36.6 C) (12/18 1658) Temp Source: Oral (12/18 1658) BP: 90/56 (12/19 0330) Pulse Rate: 90 (12/19 0330)  Labs: Recent Labs    09/28/19 1652 09/28/19 1858 09/29/19 0200  HGB 11.1*  --   --   HCT 34.7*  --   --   PLT 69*  --   --   HEPARINUNFRC  --   --  <0.10*  CREATININE 0.79  --   --   TROPONINIHS 11 10  --     Estimated Creatinine Clearance: 92.9 mL/min (by C-G formula based on SCr of 0.79 mg/dL).   Medications:  Infusions:  . heparin 1,100 Units/hr (09/28/19 2021)    Assessment: Patient with low heparin level.  No significant heparin issues per RN.   Goal of Therapy:  Heparin level 0.3-0.7 units/ml Monitor platelets by anticoagulation protocol: Yes   Plan:  Increase heparin to 1350 units/hr Heparin bolus 1500 units iv x1 Check heparin level at [address], [place] 09/29/2019,3:47 AM

## 2019-09-29 NOTE — ED Notes (Signed)
Contacted pharmacy about heparin level and whether or not a dose change was needed.

## 2019-09-29 NOTE — ED Notes (Signed)
Date and time results received: 09/29/19 4:20 AM  (use smartphrase ".now" to insert current time)  Test: COVID Critical Value: Positive  Name of Provider Notified: Humphrey Rolls  Orders Received? Or Actions Taken?: provider notified, precautions initiated

## 2019-09-29 NOTE — Progress Notes (Addendum)
ANTICOAGULATION CONSULT NOTE - Follow Up Consult  Pharmacy Consult for heparin Indication: pulmonary embolus  No Known Allergies  Patient Measurements: Height: 5\' 1"  (154.9 cm) Weight: 211 lb (95.7 kg) IBW/kg (Calculated) : 47.8 Heparin Dosing Weight: 71 kg  Vital Signs: BP: 108/63 (12/19 1030) Pulse Rate: 95 (12/19 1030)  Labs: Recent Labs    09/28/19 1652 09/28/19 1858 09/29/19 0200 09/29/19 0500 09/29/19 1052  HGB 11.1*  --   --  10.5*  --   HCT 34.7*  --   --  32.6*  --   PLT 69*  --   --  54*  --   LABPROT  --   --   --  12.9  --   INR  --   --   --  1.0  --   HEPARINUNFRC  --   --  <0.10*  --  0.27*  CREATININE 0.79  --   --  0.78  --   TROPONINIHS 11 10  --   --   --     Estimated Creatinine Clearance: 92.9 mL/min (by C-G formula based on SCr of 0.78 mg/dL).   Assessment: Pharmacy consulted to dose/monitor heparin in this 48 year old female with PMH significant for breast cancer on chemotherapy PTA with curative intent (Cytoxan + doxorubicin as well as Neulasta given 12/14). CT angio chest on 12/18 revealed acute bilateral lower lobe PE, acute occlusive SVC thrombosis. Vascular surgery consulted. Not on anticoagulation PTA. Pt also COVID +.  Baseline labs: -Hgb 11.1 -Plt 69 are low s/p chemotherapy  Today, 09/29/19  HL = 0.27 is slightly subtherapeutic on heparin infusion of 1350 units/hr  Confirmed with RN that heparin infusing at correct rate. No signs/symptoms of bleeding.   Hgb (10.5) - low but stable  Plt (54) - low s/p chemotherapy  Goal of Therapy:  Heparin level 0.3-0.7 units/ml Monitor platelets by anticoagulation protocol: Yes   Plan:   Increase heparin infusion to 1500 units/hr  Check HL in 6 hours  CBC, HL daily   Monitor closely for any signs/symptoms of bleeding given thrombocytopenia  Follow along for transition to PO or subQ anticoagulation  Lenis Noon, PharmD 09/29/2019,12:20 PM   Addendum - Evening Follow  Up:  Assessment:  HL = 0.44 is therapeutic on heparin infusion of 1500 units/hr.   Confirmed with RN that heparin infusing at correct rate. No signs of bleeding. Slight bruising reported at site where BP cuff was on arm. BF cuff has been moved. Monitor.  Plan:  Continue heparin infusion at current rate of 1500 units/hr  Check confirmatory HL in 6 hours  CBC and HL daily  Monitor for signs/symptoms of bleeding  Planning to transfer patient to [facility].   Lenis Noon, PharmD 09/29/19 7:47 PM

## 2019-09-29 NOTE — Progress Notes (Signed)
Veronica [name]   DOB:10-10-1971   VO#:[id number]   VQQ#:[id number]  Subjective:  Feels less anxious this AM. Denies cough, pleurisy, but gets SOB with activity; continues to have significant upper body swelling. No pain, no fever, no diarrhea; no family in room   Objective: young white woman examined in bed Vitals:   09/29/19 0730 09/29/19 0800  BP: 101/63 (!) 111/49  Pulse: (!) 103 86  Resp: 14   Temp:    SpO2: 93% 96%    Body mass index is 39.87 kg/m. No intake or output data in the 24 hours ending 09/29/19 1008   Sclerae unicteric  Lungs no rhonchi or wheezes  Heart regular rate and rhythm  Abdomen soft, +BS  Neuro nonfocal  Breast exam: deferred  CBG (last 3)  No results for input(s): GLUCAP in the last 72 hours.   Labs:  Lab Results  Component Value Date   WBC 14.6 (H) 09/29/2019   HGB 10.5 (L) 09/29/2019   HCT 32.6 (L) 09/29/2019   MCV 94.2 09/29/2019   PLT 54 (L) 09/29/2019   NEUTROABS 27.1 (H) 09/28/2019    '@LASTCHEMISTRY' @  Urine Studies No results for input(s): UHGB, CRYS in the last 72 hours.  Invalid input(s): UACOL, UAPR, USPG, UPH, UTP, UGL, UKET, UBIL, UNIT, UROB, ULEU, UEPI, UWBC, URBC, UBAC, CAST, UCOM, BILUA  Basic Metabolic Panel: Recent Labs  Lab 09/24/19 1005 09/28/19 1652 09/29/19 0500  NA 138 136 137  K 3.6 3.3* 3.7  CL 106 100 102  CO2 22 21* 22  GLUCOSE 127* 171* 148*  BUN 7 24* 23*  CREATININE 0.64 0.79 0.78  CALCIUM 9.1 9.6 9.5   GFR Estimated Creatinine Clearance: 92.9 mL/min (by C-G formula based on SCr of 0.78 mg/dL). Liver Function Tests: Recent Labs  Lab 09/24/19 1005 09/29/19 0500  AST 28 23  ALT 34 34  ALKPHOS 69 89  BILITOT 0.9 1.4*  PROT 6.4* 6.1*  ALBUMIN 3.7 3.7   No results for input(s): LIPASE, AMYLASE in the last 168 hours. No results for input(s): AMMONIA in the last 168 hours. Coagulation profile Recent Labs  Lab 09/29/19 0500  INR 1.0    CBC: Recent Labs  Lab 09/24/19 1005 09/28/19 1652  09/29/19 0500  WBC 6.5 28.3* 14.6*  NEUTROABS 4.9 27.1*  --   HGB 11.0* 11.1* 10.5*  HCT 33.6* 34.7* 32.6*  MCV 91.3 94.0 94.2  PLT 178 69* 54*   Cardiac Enzymes: No results for input(s): CKTOTAL, CKMB, CKMBINDEX, TROPONINI in the last 168 hours. BNP: Invalid input(s): POCBNP CBG: No results for input(s): GLUCAP in the last 168 hours. D-Dimer No results for input(s): DDIMER in the last 72 hours. Hgb A1c No results for input(s): HGBA1C in the last 72 hours. Lipid Profile No results for input(s): CHOL, HDL, LDLCALC, TRIG, CHOLHDL, LDLDIRECT in the last 72 hours. Thyroid function studies No results for input(s): TSH, T4TOTAL, T3FREE, THYROIDAB in the last 72 hours.  Invalid input(s): FREET3 Anemia work up No results for input(s): VITAMINB12, FOLATE, FERRITIN, TIBC, IRON, RETICCTPCT in the last 72 hours. Microbiology Recent Results (from the past 240 hour(s))  SARS CORONAVIRUS 2 (TAT 6-24 HRS) Nasopharyngeal Nasopharyngeal Swab     Status: Abnormal   Collection Time: 09/28/19  4:52 PM   Specimen: Nasopharyngeal Swab  Result Value Ref Range Status   SARS Coronavirus 2 POSITIVE (A) NEGATIVE Final    Comment: RESULT CALLED TO, READ BACK BY AND VERIFIED WITH: H. COOK,RN [id number] 09/29/2019 T. TYSOR (NOTE) SARS-CoV-2  target nucleic acids are DETECTED. The SARS-CoV-2 RNA is generally detectable in upper and lower respiratory specimens during the acute phase of infection. Positive results are indicative of the presence of SARS-CoV-2 RNA. Clinical correlation with patient history and other diagnostic information is  necessary to determine patient infection status. Positive results do not rule out bacterial infection or co-infection with other viruses.  The expected result is Negative. Fact Sheet for Patients: SugarRoll.be Fact Sheet for Healthcare Providers: https://www.woods-mathews.com/ This test is not yet approved or cleared by the [place] FDA and  has been authorized for detection and/or diagnosis of SARS-CoV-2 by FDA under an Emergency Use Authorization (EUA). This EUA will remain  in effect (meaning this test can be used) for the  duration of the COVID-19 declaration under Section 564(b)(1) of the Act, 21 U.S.C. section 360bbb-3(b)(1), unless the authorization is terminated or revoked sooner. Performed at [facility], [place] [address], [place], [place] [address]       Studies:  CT Head Wo Contrast  Result Date: 09/28/2019 CLINICAL DATA:  History of breast cancer. Surveillance prior to initiation of anticoagulant therapy for pulmonary embolism. EXAM: CT HEAD WITHOUT CONTRAST TECHNIQUE: Contiguous axial images were obtained from the base of the skull through the vertex without intravenous contrast. COMPARISON:  None. FINDINGS: Brain: There is no evidence of acute intracranial hemorrhage, mass lesion, brain edema or extra-axial fluid collection. The ventricles and subarachnoid spaces are appropriately sized for age. There is no CT evidence of acute cortical infarction. Vascular:  No hyperdense vessel identified. Skull: Negative for fracture or focal lesion. Sinuses/Orbits: There is a small right mastoid effusion without coalescence. The visualized paranasal sinuses, left mastoid air cells and middle ears are clear. Other: None. IMPRESSION: No acute intracranial or calvarial findings. No evidence of metastatic disease. Small right mastoid effusion. Electronically Signed   By: Richardean Sale M.D.   On: 09/28/2019 18:47   CT ANGIO CHEST PE W OR WO CONTRAST  Result Date: 09/28/2019 CLINICAL DATA:  Right breast cancer with ongoing chemotherapy, presenting with dyspnea and facial, neck and right upper extremity swelling. EXAM: CT ANGIOGRAPHY CHEST WITH CONTRAST TECHNIQUE: Multidetector CT imaging of the chest was performed using the standard protocol during bolus administration of intravenous contrast. Multiplanar CT  image reconstructions and MIPs were obtained to evaluate the vascular anatomy. CONTRAST:  146m OMNIPAQUE IOHEXOL 350 MG/ML SOLN COMPARISON:  09/24/2019 chest radiograph. FINDINGS: Cardiovascular: The study is low to moderate quality for the evaluation of pulmonary embolism, with significant motion degradation and suboptimal contrast opacification. There are acute segmental pulmonary emboli in the bilateral lower lobes (series 5/image 140 on the right and image 132 on the left). No central/saddle pulmonary emboli. Normal course and caliber of the thoracic aorta. Normal caliber main pulmonary artery (2.5 cm diameter). Right internal jugular Port-A-Cath terminates in the lower third of the SVC. Low-attenuation occlusive filling defect throughout the SVC surrounding the Port-A-Cath with near occlusive filling defect surrounding the Port-A-Cath in the right brachiocephalic vein. Extensive opacified venous collaterals throughout the mediastinum and left chest wall draining to the IVC via diaphragmatic collaterals. Normal heart size. No significant pericardial fluid/thickening. Mediastinum/Nodes: Hypodense 1.6 cm right thyroid nodule anteriorly. Unremarkable esophagus. No pathologically enlarged axillary, mediastinal or hilar lymph nodes. Lungs/Pleura: No pneumothorax. Small dependent bilateral pleural effusions, right greater than left. There is a focus of consolidation in the peripheral right lower lobe measuring 2.9 x 1.6 cm (series 10/image 77). Hypoventilatory changes are present both lung bases. No  additional significant pulmonary nodules on this motion degraded scan. Upper abdomen: Cholecystectomy. Musculoskeletal: No aggressive appearing focal osseous lesions. Mild thoracic spondylosis. Partially visualized fluid density focus in the lateral right breast measuring at least 6.0 x 5.2 cm (series 5/image 116). Asymmetric skin thickening throughout right breast. Subcutaneous edema throughout ventral upper chest wall.  Review of the MIP images confirms the above findings. IMPRESSION: 1. Acute segmental bilateral lower lobe pulmonary emboli. 2. Occlusive low-attenuation filling defect throughout the SVC surrounding the right IJ Port-A-Cath with extensive opacified venous collaterals throughout the mediastinum and ventral left chest wall draining to the IVC across the diaphragm. 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 3. Peripheral right lower lobe 2.9 cm focus of consolidation, indeterminate with differential including pulmonary infarct, pneumonia or neoplasm. Short-term follow-up chest CT advised in 3 months. 4. Small dependent bilateral pleural effusions. 5. Asymmetric right breast skin thickening with subcutaneous edema throughout ventral upper chest wall. Partially visualized fluid density focus in the lateral right breast, potentially representing postsurgical fluid collection. 6. Hypodense 1.6 cm anterior right thyroid nodule. No follow-up recommended unless clinically warranted (ref: J Am Coll Radiol. 2015 Feb;12(2): 143-50). Critical Value/emergent results were called by telephone at the time of interpretation on 09/28/2019 at 4:09 pm to provider Sandi Mealy , who verbally acknowledged these results. Electronically Signed   By: Ilona Sorrel M.D.   On: 09/28/2019 16:11    Assessment: 48 y.o. Veronica [name], [place] woman admitted with extensive SVC-associated clot/ PEs, complicated by clinical SVC syndrome, in the setting of early-stage breast cancer treatment as follows  (0) status post right breast upper outer quadrant biopsy 05/01/2019 for a clinical T1c N1, stage IIA invasive ductal carcinoma, grade 3, estrogen and progesterone receptor positive, HER-2 nonamplified, with an MIB-1-1 of 20%.             (a) mass in the axillary tail was a positive lymph node  (1) MammaPrint obtained from the original biopsy shows a high risk luminal subtype B tumor  (2) genetics testing 05/09/2019  through the Common Hereditary Gene Panel offered by [facility] found no deleterious mutations in APC, ATM, AXIN2, BARD1, BMPR1A, BRCA1, BRCA2, BRIP1, CDH1, CDK4, CDKN2A (p14ARF), CDKN2A (p16INK4a), CHEK2, CTNNA1, DICER1, EPCAM (Deletion/duplication testing only), GREM1 (promoter region deletion/duplication testing only), KIT, MEN1, MLH1, MSH2, MSH3, MSH6, MUTYH, NBN, NF1, NHTL1, PALB2, PDGFRA, PMS2, POLD1, POLE, PTEN, RAD50, RAD51C, RAD51D, RNF43, SDHB, SDHC, SDHD, SMAD4, SMARCA4. STK11, TP53, TSC1, TSC2, and VHL. The following genes were evaluated for sequence changes only: SDHA and HOXB13 c.251G>A variant only.             (a) A variant of uncertain significance (VUS) was detected in one of her MSH6 genes (c.831A>C).  (3) status post right lumpectomy and sentinel lymph node sampling 06/12/2019 for a pT2 pN1, stage IIA invasive ductal carcinoma, grade 2, with positive margins             (a) a total of 4 sentinel lymph nodes removed, one positive (with ECE), ine itc             (b) margin clearance 04/19/2019 successful medial margin close but negative for DCIS  (4)adjuvant chemotherapy will consist of doxorubicin and cyclophosphamide in dose dense fashion x4starting 09/29/2020followed by weekly paclitaxel x12             (a) echo 06/26/2019 shows an EF of 60-65%             (b echo on 09/19/2019 shows  an EF of 60-65%  (5)adjuvant radiationto follow  (5) antiestrogensto start at the completion of local treatment    Plan:  [place] is now day 6 cycle 4 of chemo given with curative intent. Her WBC count never dropped significantly with prior treatments and we dropped the dose the most recent cycle; also she received neulasta 5 days ago. Accordingly I do not anticipate problems from neutropenia in the next several days.   Her platelets are moderately low at 54K today; if she is to undergo any procedure she could be transfused at vascular's discretion--this is a production problem not a platelet  destruction issue so her counts should respond well to transfusion if needed.  Normally we leave the port in while treating a port-associated DVT but given the extensive SVC clot I would be comfortable having the port removed if that would help clear the SVC issue. We can always use a PICC for remaining chemotherapy treatments in future.  She tells me her husband was COVID positive around Thanksgivings. She never experienced symptoms associated to this infection.  While treatment of the PE's is straightforward I will defer to vascular re management of the central clot. Discussed with Caree today  Will follow with you     Chauncey Cruel, MD 09/29/2019  10:08 AM Medical Oncology and Hematology [facility] [address] [place], [place] [address] Tel. [phone]    Fax. [phone]

## 2019-09-29 NOTE — Progress Notes (Signed)
PROGRESS NOTE    Veronica [name]  [id number] DOB: 07/10/1971 DOA: 09/28/2019 PCP: Blair Heys, PA-C    Brief Narrative:  48 year old female with breast cancer on chemotherapy admitted with acute SVC syndrome and pulmonary embolism, found to be COVID-19 positive   Assessment & Plan:   Principal Problem:   Multiple subsegmental pulmonary emboli without acute cor pulmonale (HCC) Active Problems:   Malignant neoplasm of upper-outer quadrant of right breast in female, estrogen receptor positive (HCC)   Thrombocytopenia (HCC)   Superior vena cava thrombosis (HCC)   SVCO (superior vena cava obstruction)   SVC syndrome   Acute SVC syndrome Acute pulmonary embolism With background history of breast cancer on chemotherapy Troponin negative.  BNP elevated 335  COVID-19 positive On heparin drip, monitor for bleeding given thrombocytopenia Will get echocardiogram to look for right ventricular strain Vascular surgery consulted overnight.  No indication for emergent intervention.  Plan for transfer to [facility]  Thrombocytopenia Platelet 54,000 Likely in setting of chemotherapy  Leukocytosis Afebrile with no obvious sign of infection Likely reactive Continue to monitor  Hypokalemia Replace as needed  COVID-19 positive Lungs clear to auscultation.  Patient is otherwise asymptomatic. Inflammatory markers pending Changes on CT scan suggestive of COVID-19 pneumonia.  Saturating well on room air with no dyspnea. Will monitor closely  Hypertension Borderline low blood pressure Hold home antihypertensives ARB and hydrochlorothiazide  Breast cancer on chemotherapy Oncology on board ?  Plan for port removal   DVT prophylaxis: [id number] drip Code Status: Full code  Family Communication:  Disposition Plan: Transfer to [facility]   Consultants:   Oncology, vascular surgery  Procedures:  None  Antimicrobials:   None   Subjective: Admitted  overnight for SVC syndrome and pulmonary embolism.  Denies dyspnea or chest pain.  Notes upper extremity swelling.  Denies abdominal pain, nausea.  Reports having multiple bowel movements but appear to be well formed.  She usually has multiple bowel movements after chemotherapy.  Objective: Vitals:   09/29/19 1400 09/29/19 1415 09/29/19 1430 09/29/19 1445  BP: (!) 98/57  (!) 100/35   Pulse: 88 99 95 96  Resp:    18  Temp:      TempSrc:      SpO2: 99% 98% 100% 99%  Weight:      Height:       No intake or output data in the 24 hours ending 09/29/19 1535 Filed Weights   09/28/19 1659  Weight: 95.7 kg    Examination:  General exam: Appears calm and comfortable  Respiratory system: Clear to auscultation. Respiratory effort normal. Cardiovascular system: S1 & S2 heard, RRR.  Facial swelling noted.  No pedal edema. Gastrointestinal system: Abdomen is nondistended, soft and nontender. Central nervous system: Alert and oriented. No focal neurological deficits. Extremities: Symmetric 5 x 5 power.  Bilateral upper extremity swelling present. Skin: No rashes, lesions or ulcers Psychiatry: Judgement and insight appear normal. Mood & affect appropriate.    Data Reviewed: I have personally reviewed following labs and imaging studies  CBC: Recent Labs  Lab 09/24/19 1005 09/28/19 1652 09/29/19 0500  WBC 6.5 28.3* 14.6*  NEUTROABS 4.9 27.1*  --   HGB 11.0* 11.1* 10.5*  HCT 33.6* 34.7* 32.6*  MCV 91.3 94.0 94.2  PLT 178 69* 54*   Basic Metabolic Panel: Recent Labs  Lab 09/24/19 1005 09/28/19 1652 09/29/19 0500  NA 138 136 137  K 3.6 3.3* 3.7  CL 106 100 102  CO2 22 21*  22  GLUCOSE 127* 171* 148*  BUN 7 24* 23*  CREATININE 0.64 0.79 0.78  CALCIUM 9.1 9.6 9.5   GFR: Estimated Creatinine Clearance: 92.9 mL/min (by C-G formula based on SCr of 0.78 mg/dL). Liver Function Tests: Recent Labs  Lab 09/24/19 1005 09/29/19 0500  AST 28 23  ALT 34 34  ALKPHOS 69 89  BILITOT  0.9 1.4*  PROT 6.4* 6.1*  ALBUMIN 3.7 3.7   No results for input(s): LIPASE, AMYLASE in the last 168 hours. No results for input(s): AMMONIA in the last 168 hours. Coagulation Profile: Recent Labs  Lab 09/29/19 0500  INR 1.0   Cardiac Enzymes: No results for input(s): CKTOTAL, CKMB, CKMBINDEX, TROPONINI in the last 168 hours. BNP (last 3 results) No results for input(s): PROBNP in the last 8760 hours. HbA1C: No results for input(s): HGBA1C in the last 72 hours. CBG: No results for input(s): GLUCAP in the last 168 hours. Lipid Profile: No results for input(s): CHOL, HDL, LDLCALC, TRIG, CHOLHDL, LDLDIRECT in the last 72 hours. Thyroid Function Tests: No results for input(s): TSH, T4TOTAL, FREET4, T3FREE, THYROIDAB in the last 72 hours. Anemia Panel: No results for input(s): VITAMINB12, FOLATE, FERRITIN, TIBC, IRON, RETICCTPCT in the last 72 hours. Sepsis Labs: No results for input(s): PROCALCITON, LATICACIDVEN in the last 168 hours.  Recent Results (from the past 240 hour(s))  SARS CORONAVIRUS 2 (TAT 6-24 HRS) Nasopharyngeal Nasopharyngeal Swab     Status: Abnormal   Collection Time: 09/28/19  4:52 PM   Specimen: Nasopharyngeal Swab  Result Value Ref Range Status   SARS Coronavirus 2 POSITIVE (A) NEGATIVE Final    Comment: RESULT CALLED TO, READ BACK BY AND VERIFIED WITH: H. COOK,RN [id number] 09/29/2019 T. TYSOR (NOTE) SARS-CoV-2 target nucleic acids are DETECTED. The SARS-CoV-2 RNA is generally detectable in upper and lower respiratory specimens during the acute phase of infection. Positive results are indicative of the presence of SARS-CoV-2 RNA. Clinical correlation with patient history and other diagnostic information is  necessary to determine patient infection status. Positive results do not rule out bacterial infection or co-infection with other viruses.  The expected result is Negative. Fact Sheet for Patients: SugarRoll.be Fact Sheet for  Healthcare Providers: https://www.woods-mathews.com/ This test is not yet approved or cleared by the [place] FDA and  has been authorized for detection and/or diagnosis of SARS-CoV-2 by FDA under an Emergency Use Authorization (EUA). This EUA will remain  in effect (meaning this test can be used) for the  duration of the COVID-19 declaration under Section 564(b)(1) of the Act, 21 U.S.C. section 360bbb-3(b)(1), unless the authorization is terminated or revoked sooner. Performed at [facility], [place] [address], [place], [place] [address]          Radiology Studies: CT Head Wo Contrast  Result Date: 09/28/2019 CLINICAL DATA:  History of breast cancer. Surveillance prior to initiation of anticoagulant therapy for pulmonary embolism. EXAM: CT HEAD WITHOUT CONTRAST TECHNIQUE: Contiguous axial images were obtained from the base of the skull through the vertex without intravenous contrast. COMPARISON:  None. FINDINGS: Brain: There is no evidence of acute intracranial hemorrhage, mass lesion, brain edema or extra-axial fluid collection. The ventricles and subarachnoid spaces are appropriately sized for age. There is no CT evidence of acute cortical infarction. Vascular:  No hyperdense vessel identified. Skull: Negative for fracture or focal lesion. Sinuses/Orbits: There is a small right mastoid effusion without coalescence. The visualized paranasal sinuses, left mastoid air cells and middle ears are clear. Other: None.  IMPRESSION: No acute intracranial or calvarial findings. No evidence of metastatic disease. Small right mastoid effusion. Electronically Signed   By: Richardean Sale M.D.   On: 09/28/2019 18:47   CT ANGIO CHEST PE W OR WO CONTRAST  Result Date: 09/28/2019 CLINICAL DATA:  Right breast cancer with ongoing chemotherapy, presenting with dyspnea and facial, neck and right upper extremity swelling. EXAM: CT ANGIOGRAPHY CHEST WITH CONTRAST TECHNIQUE: Multidetector CT  imaging of the chest was performed using the standard protocol during bolus administration of intravenous contrast. Multiplanar CT image reconstructions and MIPs were obtained to evaluate the vascular anatomy. CONTRAST:  1106mL OMNIPAQUE IOHEXOL 350 MG/ML SOLN COMPARISON:  09/24/2019 chest radiograph. FINDINGS: Cardiovascular: The study is low to moderate quality for the evaluation of pulmonary embolism, with significant motion degradation and suboptimal contrast opacification. There are acute segmental pulmonary emboli in the bilateral lower lobes (series 5/image 140 on the right and image 132 on the left). No central/saddle pulmonary emboli. Normal course and caliber of the thoracic aorta. Normal caliber main pulmonary artery (2.5 cm diameter). Right internal jugular Port-A-Cath terminates in the lower third of the SVC. Low-attenuation occlusive filling defect throughout the SVC surrounding the Port-A-Cath with near occlusive filling defect surrounding the Port-A-Cath in the right brachiocephalic vein. Extensive opacified venous collaterals throughout the mediastinum and left chest wall draining to the IVC via diaphragmatic collaterals. Normal heart size. No significant pericardial fluid/thickening. Mediastinum/Nodes: Hypodense 1.6 cm right thyroid nodule anteriorly. Unremarkable esophagus. No pathologically enlarged axillary, mediastinal or hilar lymph nodes. Lungs/Pleura: No pneumothorax. Small dependent bilateral pleural effusions, right greater than left. There is a focus of consolidation in the peripheral right lower lobe measuring 2.9 x 1.6 cm (series 10/image 77). Hypoventilatory changes are present both lung bases. No additional significant pulmonary nodules on this motion degraded scan. Upper abdomen: Cholecystectomy. Musculoskeletal: No aggressive appearing focal osseous lesions. Mild thoracic spondylosis. Partially visualized fluid density focus in the lateral right breast measuring at least 6.0 x 5.2 cm  (series 5/image 116). Asymmetric skin thickening throughout right breast. Subcutaneous edema throughout ventral upper chest wall. Review of the MIP images confirms the above findings. IMPRESSION: 1. Acute segmental bilateral lower lobe pulmonary emboli. 2. Occlusive low-attenuation filling defect throughout the SVC surrounding the right IJ Port-A-Cath with extensive opacified venous collaterals throughout the mediastinum and ventral left chest wall draining to the IVC across the diaphragm. 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 3. Peripheral right lower lobe 2.9 cm focus of consolidation, indeterminate with differential including pulmonary infarct, pneumonia or neoplasm. Short-term follow-up chest CT advised in 3 months. 4. Small dependent bilateral pleural effusions. 5. Asymmetric right breast skin thickening with subcutaneous edema throughout ventral upper chest wall. Partially visualized fluid density focus in the lateral right breast, potentially representing postsurgical fluid collection. 6. Hypodense 1.6 cm anterior right thyroid nodule. No follow-up recommended unless clinically warranted (ref: J Am Coll Radiol. 2015 Feb;12(2): 143-50). Critical Value/emergent results were called by telephone at the time of interpretation on 09/28/2019 at 4:09 pm to provider Sandi Mealy , who verbally acknowledged these results. Electronically Signed   By: Ilona Sorrel M.D.   On: 09/28/2019 16:11        Scheduled Meds: . famotidine  10 mg Oral BID  . loratadine  10 mg Oral See admin instructions  .  morphine injection  4 mg Intravenous Once   Continuous Infusions: . heparin 1,500 Units/hr (09/29/19 1232)     LOS: 1 day    Time  spent: Spent more than 30 minutes in coordinating care for this patient including bedside patient care.   Lucky Cowboy, MD [facility] If 7PM-7AM, please contact night-coverage 09/29/2019, 3:35 PM

## 2019-09-29 NOTE — Progress Notes (Signed)
I have reviewed patient's CTA which shows SVC occlusion and PE.  She is having symptoms of SVC syndrome and is now COVID positive.  I have recommended transfer to [facility] for attempt at mechanical thrombectomy of her clot.  In the interim, she will continue her heparin drip.  This will likely need to be done in the OR.  I am scheduling her tentatively for Tuesday.  I discussed with Dr. Jana Hakim earlier today that she may require removal of her port in order to treat her thrombus.  I will evaluate the patient when she arrives at [facility] Vascular Surgery [phone]

## 2019-09-30 ENCOUNTER — Inpatient Hospital Stay (HOSPITAL_COMMUNITY): Payer: 59

## 2019-09-30 DIAGNOSIS — U071 COVID-19: Secondary | ICD-10-CM

## 2019-09-30 DIAGNOSIS — I2602 Saddle embolus of pulmonary artery with acute cor pulmonale: Secondary | ICD-10-CM

## 2019-09-30 DIAGNOSIS — D4860 Neoplasm of uncertain behavior of unspecified breast: Secondary | ICD-10-CM

## 2019-09-30 DIAGNOSIS — I82210 Acute embolism and thrombosis of superior vena cava: Secondary | ICD-10-CM

## 2019-09-30 LAB — CBC
HCT: 29.6 % — ABNORMAL LOW (ref 36.0–46.0)
Hemoglobin: 9.4 g/dL — ABNORMAL LOW (ref 12.0–15.0)
MCH: 29.7 pg (ref 26.0–34.0)
MCHC: 31.8 g/dL (ref 30.0–36.0)
MCV: 93.4 fL (ref 80.0–100.0)
Platelets: 40 10*3/uL — ABNORMAL LOW (ref 150–400)
RBC: 3.17 MIL/uL — ABNORMAL LOW (ref 3.87–5.11)
RDW: 14.7 % (ref 11.5–15.5)
WBC: 2.8 10*3/uL — ABNORMAL LOW (ref 4.0–10.5)
nRBC: 0 % (ref 0.0–0.2)

## 2019-09-30 LAB — ECHOCARDIOGRAM COMPLETE
Height: 61 in
Weight: 3376 oz

## 2019-09-30 LAB — HEPARIN LEVEL (UNFRACTIONATED)
Heparin Unfractionated: 0.37 IU/mL (ref 0.30–0.70)
Heparin Unfractionated: 0.28 IU/mL — ABNORMAL LOW (ref 0.30–0.70)
Heparin Unfractionated: 0.43 IU/mL (ref 0.30–0.70)

## 2019-09-30 LAB — LACTATE DEHYDROGENASE: LDH: 170 U/L (ref 98–192)

## 2019-09-30 MED ORDER — ENSURE ENLIVE PO LIQD
237.0000 mL | Freq: Two times a day (BID) | ORAL | Status: DC
  Administered 2019-10-01 (×2): 237 mL via ORAL

## 2019-09-30 MED ORDER — PERFLUTREN LIPID MICROSPHERE
1.0000 mL | INTRAVENOUS | Status: AC | PRN
  Administered 2019-09-30: 5 mL via INTRAVENOUS
  Filled 2019-09-30: qty 10

## 2019-09-30 MED ORDER — CHLORHEXIDINE GLUCONATE CLOTH 2 % EX PADS
6.0000 | MEDICATED_PAD | Freq: Every day | CUTANEOUS | Status: DC
  Administered 2019-09-30 – 2019-10-19 (×20): 6 via TOPICAL

## 2019-09-30 MED ORDER — HEPARIN (PORCINE) 25000 UT/250ML-% IV SOLN
1750.0000 [IU]/h | INTRAVENOUS | Status: DC
  Administered 2019-09-30 – 2019-10-02 (×3): 1650 [IU]/h via INTRAVENOUS
  Filled 2019-09-30 (×3): qty 250

## 2019-09-30 NOTE — Consult Note (Signed)
Vascular and Vein Specialist of [facility]  Patient name: Veronica [name] MRN: [id number] DOB: Jun 02, 1971 Sex: female   REQUESTING PROVIDER:    ER   REASON FOR CONSULT:    SVC syndrome  HISTORY OF PRESENT ILLNESS:   Veronica [name] is a 48 y.o. female, who presented to the ER with facial and upper extremity swelling for several days.  CTA revealed PR and an occluded SVC.  She has a port in place for chemo for breast cancer.  She is currently ongoing treatment.  She tested positive for COVID.  She was saturating 100% on room air.  She was admitted and placed on IV heparin.  She states that her neck and face swelling has improved but her arms remain edematous.  She continues to be without hypoxia or shortness of breath or chest pain  PAST MEDICAL HISTORY    Past Medical History:  Diagnosis Date  . Cancer ([place])   . Chronic low back pain with left-sided sciatica   . Family history of leukemia   . Family history of stomach cancer   . Hypertension      FAMILY HISTORY   Family History  Problem Relation Age of Onset  . Arthritis Mother   . COPD Mother   . Hypertension Mother   . Hyperlipidemia Mother   . Leukemia Brother 25  . Stomach cancer Maternal Grandmother        late 70s    SOCIAL HISTORY:   Social History   Socioeconomic History  . Marital status: Married    Spouse name: Not on file  . Number of children: Not on file  . Years of education: Not on file  . Highest education level: Not on file  Occupational History  . Not on file  Tobacco Use  . Smoking status: Never Smoker  . Smokeless tobacco: Never Used  Substance and Sexual Activity  . Alcohol use: Yes    Alcohol/week: 2.0 standard drinks    Types: 2 Standard drinks or equivalent per week    Comment: "2-3 a week"  . Drug use: No  . Sexual activity: Yes    Birth control/protection: Surgical  Other Topics Concern  . Not on file  Social History Narrative  . Not on  file   Social Determinants of Health   Financial Resource Strain:   . Difficulty of Paying Living Expenses: Not on file  Food Insecurity:   . Worried About Charity fundraiser in the Last Year: Not on file  . Ran Out of Food in the Last Year: Not on file  Transportation Needs:   . Lack of Transportation (Medical): Not on file  . Lack of Transportation (Non-Medical): Not on file  Physical Activity:   . Days of Exercise per Week: Not on file  . Minutes of Exercise per Session: Not on file  Stress:   . Feeling of Stress : Not on file  Social Connections:   . Frequency of Communication with Friends and Family: Not on file  . Frequency of Social Gatherings with Friends and Family: Not on file  . Attends Religious Services: Not on file  . Active Member of Clubs or Organizations: Not on file  . Attends Archivist Meetings: Not on file  . Marital Status: Not on file  Intimate Partner Violence:   . Fear of Current or Ex-Partner: Not on file  . Emotionally Abused: Not on file  . Physically Abused: Not on file  . Sexually  Abused: Not on file    ALLERGIES:    No Known Allergies  CURRENT MEDICATIONS:    Current Facility-Administered Medications  Medication Dose Route Frequency Provider Last Rate Last Admin  . acetaminophen (TYLENOL) tablet 650 mg  650 mg Oral Q6H PRN Phineas Semen, MD       Or  . acetaminophen (TYLENOL) suppository 650 mg  650 mg Rectal Q6H PRN Chinbuah, Audley Hose, MD      . Chlorhexidine Gluconate Cloth 2 % PADS 6 each  6 each Topical Daily Pokhrel, Laxman, MD   6 each at 09/30/19 1718  . famotidine (PEPCID) tablet 10 mg  10 mg Oral BID Phineas Semen, MD   10 mg at 09/30/19 2200  . [START ON 10/01/2019] feeding supplement (ENSURE ENLIVE) (ENSURE ENLIVE) liquid 237 mL  237 mL Oral BID BM Pokhrel, Laxman, MD      . heparin ADULT infusion 100 units/mL (25000 units/256mL sodium chloride 0.45%)  1,650 Units/hr Intravenous Continuous Lenis Noon, RPH 16.5  mL/hr at 09/30/19 2206 1,650 Units/hr at 09/30/19 2206  . morphine 4 MG/ML injection 4 mg  4 mg Intravenous Once Domenic Moras, PA-C   Stopped at 09/28/19 2216  . polyethylene glycol (MIRALAX / GLYCOLAX) packet 17 g  17 g Oral Daily PRN Chinbuah, Audley Hose, MD        REVIEW OF SYSTEMS:   [X]  denotes positive finding, [ ]  denotes negative finding Cardiac  Comments:  Chest pain or chest pressure:    Shortness of breath upon exertion:    Short of breath when lying flat:    Irregular heart rhythm:        Vascular    Pain in calf, thigh, or hip brought on by ambulation:    Pain in feet at night that wakes you up from your sleep:     Blood clot in your veins:    Leg swelling:         Pulmonary    Oxygen at home:    Productive cough:     Wheezing:         Neurologic    Sudden weakness in arms or legs:     Sudden numbness in arms or legs:     Sudden onset of difficulty speaking or slurred speech:    Temporary loss of vision in one eye:     Problems with dizziness:         Gastrointestinal    Blood in stool:      Vomited blood:         Genitourinary    Burning when urinating:     Blood in urine:        Psychiatric    Major depression:         Hematologic    Bleeding problems:    Problems with blood clotting too easily:        Skin    Rashes or ulcers:        Constitutional    Fever or chills:     PHYSICAL EXAM:   Vitals:   09/30/19 1654 09/30/19 2000 09/30/19 2008 09/30/19 2022  BP: 117/82  120/64   Pulse: (!) 113   (!) 115  Resp: 19 (!) 23 18 10   Temp: 98.2 F (36.8 C)   98.1 F (36.7 C)  TempSrc: Oral   Oral  SpO2: 100% 95% 100% 95%  Weight:      Height:        GENERAL:  The patient is a well-nourished female, in no acute distress. The vital signs are documented above. CARDIAC: There is a regular rate and rhythm.  VASCULAR: upper extremity edema PULMONARY: Nonlabored respirations ABDOMEN: Soft and non-tender with normal pitched bowel sounds.    MUSCULOSKELETAL: There are no major deformities or cyanosis. NEUROLOGIC: No focal weakness or paresthesias are detected. SKIN: There are no ulcers or rashes noted. PSYCHIATRIC: The patient has a normal affect.  STUDIES:   I have reviewed her CTA with the following findings: 1. Acute segmental bilateral lower lobe pulmonary emboli. 2. Occlusive low-attenuation filling defect throughout the SVC surrounding the right IJ Port-A-Cath with extensive opacified venous collaterals throughout the mediastinum and ventral left chest wall draining to the IVC across the diaphragm. 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 3. Peripheral right lower lobe 2.9 cm focus of consolidation, indeterminate with differential including pulmonary infarct, pneumonia or neoplasm. Short-term follow-up chest CT advised in 3 months. 4. Small dependent bilateral pleural effusions. 5. Asymmetric right breast skin thickening with subcutaneous edema throughout ventral upper chest wall. Partially visualized fluid density focus in the lateral right breast, potentially representing postsurgical fluid collection. 6. Hypodense 1.6 cm anterior right thyroid nodule. No follow-up recommended unless clinically warranted (ref: J Am Coll Radiol. 2015 Feb;12(2): 143-50).  ASSESSMENT and PLAN   SVC syndrome:  I discussed with the patient intervention for her symptomatic SVC occlusion.  I will plan on using the INARiI device, likely getting access from the groin and right arm.  There is the possibility that the angiovac device will be required if I can not get a good result.  This would require a second operation.  I will more than likely have to remove her port.  She is scheduled for Tuesday.  She should be NPO after midnight.  The details of the procedure were discussed with the patient, she verbalized understanding and wishes to proceed.  Continue IV heparin  Continue to monitor  thrombocytopenia   Annamarie Major, IV, MD, FACS Vascular and Vein Specialists of [facility] [phone] Pager [phone]

## 2019-09-30 NOTE — Progress Notes (Signed)
ANTICOAGULATION CONSULT NOTE - Follow Up Consult  Pharmacy Consult for heparin Indication: pulmonary embolus  No Known Allergies  Patient Measurements: Height: 5\' 1"  (154.9 cm) Weight: 211 lb (95.7 kg) IBW/kg (Calculated) : 47.8 Heparin Dosing Weight: 71 kg  Vital Signs: Temp: 98.1 F (36.7 C) (12/20 2022) Temp Source: Oral (12/20 2022) BP: 120/64 (12/20 2008) Pulse Rate: 115 (12/20 2022)  Labs: Recent Labs    09/28/19 1652 09/28/19 1858 09/29/19 0500 09/30/19 0030 09/30/19 0500 09/30/19 1150 09/30/19 2048  HGB 11.1*  --  10.5*  --  9.4*  --   --   HCT 34.7*  --  32.6*  --  29.6*  --   --   PLT 69*  --  54*  --  40*  --   --   LABPROT  --   --  12.9  --   --   --   --   INR  --   --  1.0  --   --   --   --   HEPARINUNFRC  --   --   --  0.37  --  0.28* 0.43  CREATININE 0.79  --  0.78  --   --   --   --   TROPONINIHS 11 10  --   --   --   --   --     Estimated Creatinine Clearance: 92.9 mL/min (by C-G formula based on SCr of 0.78 mg/dL).   Assessment: Pharmacy consulted to dose/monitor heparin in this 48 year old female with PMH significant for breast cancer on chemotherapy PTA with curative intent (Cytoxan + doxorubicin as well as Neulasta given 12/14). CT angio chest on 12/18 revealed acute bilateral lower lobe PE, acute occlusive SVC thrombosis. Vascular surgery consulted. Not on anticoagulation PTA. Pt also COVID +.  Baseline labs: -Hgb 11.1 -Plt 69 are low s/p chemotherapy  Heparin level now therapeutic, pltc remains low likely 2/2 chemotherapy.  Goal of Therapy:  Heparin level 0.3-0.7 units/ml Monitor platelets by anticoagulation protocol: Yes   Plan:  -Continue heparin 1650 units/h -Daily heparin level and CBC   Arrie Senate, PharmD, BCPS Clinical Pharmacist [phone] Please check AMION for all [place] numbers 09/30/2019

## 2019-09-30 NOTE — Progress Notes (Signed)
Veronica [name]   DOB:19-Aug-1971   OV#:[id number]   OAC#:[id number]  Subjective:  She is hoping to move to [facility] today. Eating clear iquids--"not a great diet." Arm swelling persists, facial swelling she thinks may be a little better. No bleeding. No family in room  Objective: young white woman examined in bed Vitals:   09/30/19 0830 09/30/19 1427  BP: 115/88 126/69  Pulse: (!) 112 (!) 114  Resp: 18 17  Temp:  99.3 F (37.4 C)  SpO2: 100% 98%    Body mass index is 39.87 kg/m.  Intake/Output Summary (Last 24 hours) at 09/30/2019 1500 Last data filed at 09/29/2019 2200 Gross per 24 hour  Intake 141.86 ml  Output --  Net 141.86 ml    CBG (last 3)  No results for input(s): GLUCAP in the last 72 hours.   Labs:  Lab Results  Component Value Date   WBC 2.8 (L) 09/30/2019   HGB 9.4 (L) 09/30/2019   HCT 29.6 (L) 09/30/2019   MCV 93.4 09/30/2019   PLT 40 (L) 09/30/2019   NEUTROABS 27.1 (H) 09/28/2019    '@LASTCHEMISTRY' @  Urine Studies No results for input(s): UHGB, CRYS in the last 72 hours.  Invalid input(s): UACOL, UAPR, USPG, UPH, UTP, UGL, UKET, UBIL, UNIT, UROB, ULEU, UEPI, UWBC, URBC, UBAC, CAST, UCOM, BILUA  Basic Metabolic Panel: Recent Labs  Lab 09/24/19 1005 09/28/19 1652 09/29/19 0500  NA 138 136 137  K 3.6 3.3* 3.7  CL 106 100 102  CO2 22 21* 22  GLUCOSE 127* 171* 148*  BUN 7 24* 23*  CREATININE 0.64 0.79 0.78  CALCIUM 9.1 9.6 9.5   GFR Estimated Creatinine Clearance: 92.9 mL/min (by C-G formula based on SCr of 0.78 mg/dL). Liver Function Tests: Recent Labs  Lab 09/24/19 1005 09/29/19 0500  AST 28 23  ALT 34 34  ALKPHOS 69 89  BILITOT 0.9 1.4*  PROT 6.4* 6.1*  ALBUMIN 3.7 3.7   No results for input(s): LIPASE, AMYLASE in the last 168 hours. No results for input(s): AMMONIA in the last 168 hours. Coagulation profile Recent Labs  Lab 09/29/19 0500  INR 1.0    CBC: Recent Labs  Lab 09/24/19 1005 09/28/19 1652 09/29/19 0500  09/30/19 0500  WBC 6.5 28.3* 14.6* 2.8*  NEUTROABS 4.9 27.1*  --   --   HGB 11.0* 11.1* 10.5* 9.4*  HCT 33.6* 34.7* 32.6* 29.6*  MCV 91.3 94.0 94.2 93.4  PLT 178 69* 54* 40*   Cardiac Enzymes: No results for input(s): CKTOTAL, CKMB, CKMBINDEX, TROPONINI in the last 168 hours. BNP: Invalid input(s): POCBNP CBG: No results for input(s): GLUCAP in the last 168 hours. D-Dimer No results for input(s): DDIMER in the last 72 hours. Hgb A1c No results for input(s): HGBA1C in the last 72 hours. Lipid Profile No results for input(s): CHOL, HDL, LDLCALC, TRIG, CHOLHDL, LDLDIRECT in the last 72 hours. Thyroid function studies No results for input(s): TSH, T4TOTAL, T3FREE, THYROIDAB in the last 72 hours.  Invalid input(s): FREET3 Anemia work up No results for input(s): VITAMINB12, FOLATE, FERRITIN, TIBC, IRON, RETICCTPCT in the last 72 hours. Microbiology Recent Results (from the past 240 hour(s))  SARS CORONAVIRUS 2 (TAT 6-24 HRS) Nasopharyngeal Nasopharyngeal Swab     Status: Abnormal   Collection Time: 09/28/19  4:52 PM   Specimen: Nasopharyngeal Swab  Result Value Ref Range Status   SARS Coronavirus 2 POSITIVE (A) NEGATIVE Final    Comment: RESULT CALLED TO, READ BACK BY AND VERIFIED WITH: H.  COOK,RN [id number] 09/29/2019 T. TYSOR (NOTE) SARS-CoV-2 target nucleic acids are DETECTED. The SARS-CoV-2 RNA is generally detectable in upper and lower respiratory specimens during the acute phase of infection. Positive results are indicative of the presence of SARS-CoV-2 RNA. Clinical correlation with patient history and other diagnostic information is  necessary to determine patient infection status. Positive results do not rule out bacterial infection or co-infection with other viruses.  The expected result is Negative. Fact Sheet for Patients: SugarRoll.be Fact Sheet for Healthcare Providers: https://www.woods-mathews.com/ This test is not yet  approved or cleared by the [place] FDA and  has been authorized for detection and/or diagnosis of SARS-CoV-2 by FDA under an Emergency Use Authorization (EUA). This EUA will remain  in effect (meaning this test can be used) for the  duration of the COVID-19 declaration under Section 564(b)(1) of the Act, 21 U.S.C. section 360bbb-3(b)(1), unless the authorization is terminated or revoked sooner. Performed at [facility], [place] [address] [place], [place] [address]       Studies:  CT Head Wo Contrast  Result Date: 09/28/2019 CLINICAL DATA:  History of breast cancer. Surveillance prior to initiation of anticoagulant therapy for pulmonary embolism. EXAM: CT HEAD WITHOUT CONTRAST TECHNIQUE: Contiguous axial images were obtained from the base of the skull through the vertex without intravenous contrast. COMPARISON:  None. FINDINGS: Brain: There is no evidence of acute intracranial hemorrhage, mass lesion, brain edema or extra-axial fluid collection. The ventricles and subarachnoid spaces are appropriately sized for age. There is no CT evidence of acute cortical infarction. Vascular:  No hyperdense vessel identified. Skull: Negative for fracture or focal lesion. Sinuses/Orbits: There is a small right mastoid effusion without coalescence. The visualized paranasal sinuses, left mastoid air cells and middle ears are clear. Other: None. IMPRESSION: No acute intracranial or calvarial findings. No evidence of metastatic disease. Small right mastoid effusion. Electronically Signed   By: Richardean Sale M.D.   On: 09/28/2019 18:47   CT ANGIO CHEST PE W OR WO CONTRAST  Result Date: 09/28/2019 CLINICAL DATA:  Right breast cancer with ongoing chemotherapy, presenting with dyspnea and facial, neck and right upper extremity swelling. EXAM: CT ANGIOGRAPHY CHEST WITH CONTRAST TECHNIQUE: Multidetector CT imaging of the chest was performed using the standard protocol during bolus administration of  intravenous contrast. Multiplanar CT image reconstructions and MIPs were obtained to evaluate the vascular anatomy. CONTRAST:  183m OMNIPAQUE IOHEXOL 350 MG/ML SOLN COMPARISON:  09/24/2019 chest radiograph. FINDINGS: Cardiovascular: The study is low to moderate quality for the evaluation of pulmonary embolism, with significant motion degradation and suboptimal contrast opacification. There are acute segmental pulmonary emboli in the bilateral lower lobes (series 5/image 140 on the right and image 132 on the left). No central/saddle pulmonary emboli. Normal course and caliber of the thoracic aorta. Normal caliber main pulmonary artery (2.5 cm diameter). Right internal jugular Port-A-Cath terminates in the lower third of the SVC. Low-attenuation occlusive filling defect throughout the SVC surrounding the Port-A-Cath with near occlusive filling defect surrounding the Port-A-Cath in the right brachiocephalic vein. Extensive opacified venous collaterals throughout the mediastinum and left chest wall draining to the IVC via diaphragmatic collaterals. Normal heart size. No significant pericardial fluid/thickening. Mediastinum/Nodes: Hypodense 1.6 cm right thyroid nodule anteriorly. Unremarkable esophagus. No pathologically enlarged axillary, mediastinal or hilar lymph nodes. Lungs/Pleura: No pneumothorax. Small dependent bilateral pleural effusions, right greater than left. There is a focus of consolidation in the peripheral right lower lobe measuring 2.9 x 1.6 cm (series 10/image 77). Hypoventilatory  changes are present both lung bases. No additional significant pulmonary nodules on this motion degraded scan. Upper abdomen: Cholecystectomy. Musculoskeletal: No aggressive appearing focal osseous lesions. Mild thoracic spondylosis. Partially visualized fluid density focus in the lateral right breast measuring at least 6.0 x 5.2 cm (series 5/image 116). Asymmetric skin thickening throughout right breast. Subcutaneous edema  throughout ventral upper chest wall. Review of the MIP images confirms the above findings. IMPRESSION: 1. Acute segmental bilateral lower lobe pulmonary emboli. 2. Occlusive low-attenuation filling defect throughout the SVC surrounding the right IJ Port-A-Cath with extensive opacified venous collaterals throughout the mediastinum and ventral left chest wall draining to the IVC across the diaphragm. 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 3. Peripheral right lower lobe 2.9 cm focus of consolidation, indeterminate with differential including pulmonary infarct, pneumonia or neoplasm. Short-term follow-up chest CT advised in 3 months. 4. Small dependent bilateral pleural effusions. 5. Asymmetric right breast skin thickening with subcutaneous edema throughout ventral upper chest wall. Partially visualized fluid density focus in the lateral right breast, potentially representing postsurgical fluid collection. 6. Hypodense 1.6 cm anterior right thyroid nodule. No follow-up recommended unless clinically warranted (ref: J Am Coll Radiol. 2015 Feb;12(2): 143-50). Critical Value/emergent results were called by telephone at the time of interpretation on 09/28/2019 at 4:09 pm to provider Sandi Mealy , who verbally acknowledged these results. Electronically Signed   By: Ilona Sorrel M.D.   On: 09/28/2019 16:11    Assessment: 48 y.o. Heather Roberts, [place] woman admitted with extensive SVC-associated clot/ PEs, complicated by clinical SVC syndrome, in the setting of early-stage breast cancer treatment as follows  (0) status post right breast upper outer quadrant biopsy 05/01/2019 for a clinical T1c N1, stage IIA invasive ductal carcinoma, grade 3, estrogen and progesterone receptor positive, HER-2 nonamplified, with an MIB-1-1 of 20%.             (a) mass in the axillary tail was a positive lymph node  (1) MammaPrint obtained from the original biopsy shows a high risk luminal subtype B  tumor  (2) genetics testing 05/09/2019 through the Common Hereditary Gene Panel offered by [facility] found no deleterious mutations in APC, ATM, AXIN2, BARD1, BMPR1A, BRCA1, BRCA2, BRIP1, CDH1, CDK4, CDKN2A (p14ARF), CDKN2A (p16INK4a), CHEK2, CTNNA1, DICER1, EPCAM (Deletion/duplication testing only), GREM1 (promoter region deletion/duplication testing only), KIT, MEN1, MLH1, MSH2, MSH3, MSH6, MUTYH, NBN, NF1, NHTL1, PALB2, PDGFRA, PMS2, POLD1, POLE, PTEN, RAD50, RAD51C, RAD51D, RNF43, SDHB, SDHC, SDHD, SMAD4, SMARCA4. STK11, TP53, TSC1, TSC2, and VHL. The following genes were evaluated for sequence changes only: SDHA and HOXB13 c.251G>A variant only.             (a) A variant of uncertain significance (VUS) was detected in one of her MSH6 genes (c.831A>C).  (3) status post right lumpectomy and sentinel lymph node sampling 06/12/2019 for a pT2 pN1, stage IIA invasive ductal carcinoma, grade 2, with positive margins             (a) a total of 4 sentinel lymph nodes removed, one positive (with ECE), ine itc             (b) margin clearance 04/19/2019 successful medial margin close but negative for DCIS  (4)adjuvant chemotherapy will consist of doxorubicin and cyclophosphamide in dose dense fashion x4starting 09/29/2020followed by weekly paclitaxel x12             (a) echo 06/26/2019 shows an EF of 60-65%             (  b echo on 09/19/2019 shows an EF of 60-65%  (5)adjuvant radiationto follow  (6) antiestrogensto start at the completion of local treatment  (7) CT angio 09/28/2019 show bilateral pulmonary emboli and a filling defect in the SVC associated with port  (a) tentatively scheduled for thrombectomy under Dr Trula Slade 10/02/2019 at [facility]-- transfer pending    Plan:  Maeve is currently day 7 cycle 4 of her chemotherapy; this final cycle was given at reduced doses. Her platelets are trending down (40K today) and she may need a platelet transfusion prior to the Tuesday procedure-- will  repeat counts daily.   Liberalized diet as do not anticipate procedure until 12/22  Will follow with you     Chauncey Cruel, MD 09/30/2019  3:00 PM Medical Oncology and Hematology [facility] [address] [place], [place] [address] Tel. [phone]    Fax. [phone]

## 2019-09-30 NOTE — Progress Notes (Signed)
PROGRESS NOTE  Veronica [name]  DOB: [birth year]  PCP: Blair Heys, PA-C [id number]  DOA: 09/28/2019  LOS: 2 days   Chief Complaint  Patient presents with  . Bilateral Pulmonary Embolism   Brief narrative: Veronica [name] is a 48 y.o. female with medical history significant of notable for breast cancer currently on chemotherapy.  She has been at her baseline until about 3 days prior to presentation when she noticed increased facial and neck swelling associated with redness.  She also reported of bilateral upper extremity swelling especially right upper extremity.  Based on her worsening symptoms, she was seen by her provider who ordered CTA of the chest. It showed bilateral pulmonary embolism and occlusion of SVC, she was referred to ED for further management.   ED Course: She was hemodynamically stable at the ED and afebrile.  She was saturating 100% on room air.  Patient was started on heparin drip. Case was discussed case with vascular surgeon Dr. Trula Slade. Admitted to hospitalist services for further evaluation and management Incidentally, she is positive for COVID-19 PCR.  Subjective: Patient was seen and examined this afternoon in the ED.  She is waiting to be transferred to [facility] this evening.  Consult note from Dr. Jana Hakim appreciated.  Assessment/Plan: Acute SVC syndrome Acute pulmonary embolism With background history of breast cancer on chemotherapy Currently on heparin drip. Vascular surgery consult appreciated.  Noted a plan for mechanical thrombectomy likely in OR, tentatively for Tuesday. Pending bed of DVT at [facility].  COVID-19 infection Lungs clear to auscultation.  Patient is otherwise asymptomatic. Saturating well on room air with no dyspnea. Will monitor closely Currently not on treatment for Covid.  Thrombocytopenia Platelet 54,000 on admission, down to 40,000 today. Likely in setting of chemotherapy  Leukocytosis to leukopenia Afebrile  with no obvious sign of infection WC count was 28,000 on admission was drastically dropped down to 2000 today. Continue to monitor  Hypokalemia Replace as needed  Hypertension Borderline low blood pressure Hold home antihypertensives ARB and hydrochlorothiazide  Breast cancer on chemotherapy Oncology on board ?  Plan for port removal  DVT prophylaxis: [id number] drip Code Status: Full code  Family Communication:  Disposition Plan: Transfer to [facility]  Consultants:  Vascular surgery, oncology  Procedures:  Thrombectomy in OR, planned for Tuesday,  Antimicrobials: Anti-infectives (From admission, onward)   None        Code Status: Full Code   Diet Order            Diet regular Room service appropriate? Yes; Fluid consistency: Thin  Diet effective now              Infusions:  . heparin 1,650 Units/hr (09/30/19 1355)    Scheduled Meds: . famotidine  10 mg Oral BID  . loratadine  10 mg Oral See admin instructions  .  morphine injection  4 mg Intravenous Once    PRN meds: acetaminophen **OR** acetaminophen, perflutren lipid microspheres (DEFINITY) IV suspension, polyethylene glycol   Objective: Vitals:   09/30/19 0830 09/30/19 1427  BP: 115/88 126/69  Pulse: (!) 112 (!) 114  Resp: 18 17  Temp:  99.3 F (37.4 C)  SpO2: 100% 98%    Intake/Output Summary (Last 24 hours) at 09/30/2019 1612 Last data filed at 09/29/2019 2200 Gross per 24 hour  Intake 141.86 ml  Output --  Net 141.86 ml   Filed Weights   09/28/19 1659  Weight: 95.7 kg   Weight change:  Body  mass index is 39.87 kg/m.   Physical Exam: General exam: Appears calm and comfortable.  Skin: No rashes, lesions or ulcers. HEENT: Atraumatic, normocephalic, supple neck, no obvious bleeding Lungs: Clear to auscultation bilaterally CVS: Regular rate and rhythm, no murmur GI/Abd soft, nontender, nondistended, bowel sound present CNS: Alert, awake, oriented  x3 Psychiatry: Mood appropriate Extremities: No pedal edema, no calf tenderness  Data Review: I have personally reviewed the laboratory data and studies available.  Recent Labs  Lab 09/24/19 1005 09/28/19 1652 09/29/19 0500 09/30/19 0500  WBC 6.5 28.3* 14.6* 2.8*  NEUTROABS 4.9 27.1*  --   --   HGB 11.0* 11.1* 10.5* 9.4*  HCT 33.6* 34.7* 32.6* 29.6*  MCV 91.3 94.0 94.2 93.4  PLT 178 69* 54* 40*   Recent Labs  Lab 09/24/19 1005 09/28/19 1652 09/29/19 0500  NA 138 136 137  K 3.6 3.3* 3.7  CL 106 100 102  CO2 22 21* 22  GLUCOSE 127* 171* 148*  BUN 7 24* 23*  CREATININE 0.64 0.79 0.78  CALCIUM 9.1 9.6 9.5    Terrilee Croak, MD  [facility] 09/30/2019

## 2019-09-30 NOTE — Progress Notes (Signed)
Echocardiogram 2D Echocardiogram has been performed.  Oneal Deputy Jeniah Kishi 09/30/2019, 10:39 AM

## 2019-09-30 NOTE — Progress Notes (Signed)
CORINNE CARRIGAN [id number] Admission Data: 09/30/2019 5:10 PM Attending Provider: Flora Lipps, MD  [id number], Caryl Pina, PA-C  Veronica [name] is a 48 y.o. female patient admitted from [facility] ED awake, alert  & orientated  X 4,  Full Code, VSS - Blood pressure 117/82, pulse (!) 113, temperature 98.2 F (36.8 C), temperature source Oral, resp. rate 19, height 5\' 1"  (1.549 m), weight 95.7 kg, SpO2 100 %., on room air.  No c/o shortness of breath, no c/o chest pain, no distress noted. Tele # [id number] placed and pt is currently running:sinus tachycardia.  Pt orientation to unit, room and routine. Information packet given to patient/family.  Admission INP armband ID verified with patient/family, and in place. SR up x 2, fall risk assessment complete with patient and family verbalizing understanding of risks associated with falls. Pt verbalizes an understanding of how to use the call bell and to call for help before getting out of bed.  Skin, clean-dry- intact without evidence of bruising, or skin tears.   No evidence of skin break down noted on exam. Will continue to monitor and assist as needed.  Hiram Comber, RN 09/30/2019 5:10 PM

## 2019-09-30 NOTE — ED Notes (Signed)
She remains in no distress, and I get her a sandwich per her request. Also, her heparin is adjusted per pharmacist's instructions: from 1500 units/hour to 1650 units per hour.

## 2019-09-30 NOTE — Progress Notes (Signed)
ANTICOAGULATION CONSULT NOTE - Follow Up Consult  Pharmacy Consult for Heparin Indication: pulmonary embolus  No Known Allergies  Patient Measurements: Height: 5\' 1"  (154.9 cm) Weight: 211 lb (95.7 kg) IBW/kg (Calculated) : 47.8 Heparin Dosing Weight:   Vital Signs: Temp: 98.4 F (36.9 C) (12/19 2217) Temp Source: Oral (12/19 2217) BP: 117/62 (12/20 0330) Pulse Rate: 107 (12/20 0330)  Labs: Recent Labs    09/28/19 1652 09/28/19 1858 09/29/19 0500 09/29/19 1052 09/29/19 1837 09/30/19 0030  HGB 11.1*  --  10.5*  --   --   --   HCT 34.7*  --  32.6*  --   --   --   PLT 69*  --  54*  --   --   --   LABPROT  --   --  12.9  --   --   --   INR  --   --  1.0  --   --   --   HEPARINUNFRC  --   --   --  0.27* 0.44 0.37  CREATININE 0.79  --  0.78  --   --   --   TROPONINIHS 11 10  --   --   --   --     Estimated Creatinine Clearance: 92.9 mL/min (by C-G formula based on SCr of 0.78 mg/dL).   Medications:  Infusions:  . heparin 1,500 Units/hr (09/29/19 2200)    Assessment: Patient with heparin level at goal.  No heparin issues noted.  Goal of Therapy:  Heparin level 0.3-0.7 units/ml Monitor platelets by anticoagulation protocol: Yes   Plan:  Continue heparin drip at current rate Recheck level with [facility], with next level at 1200 12/20  Tyler Deis, Shea Stakes Crowford 09/30/2019,4:19 AM

## 2019-09-30 NOTE — ED Notes (Signed)
I have just phoned report to [place], Therapist, sports at [facility]. I will notify [facility] now for transport.

## 2019-09-30 NOTE — Progress Notes (Signed)
ANTICOAGULATION CONSULT NOTE - Follow Up Consult  Pharmacy Consult for heparin Indication: pulmonary embolus  No Known Allergies  Patient Measurements: Height: 5\' 1"  (154.9 cm) Weight: 211 lb (95.7 kg) IBW/kg (Calculated) : 47.8 Heparin Dosing Weight: 71 kg  Vital Signs: Temp: 99 F (37.2 C) (12/20 0800) Temp Source: Oral (12/20 0800) BP: 115/88 (12/20 0830) Pulse Rate: 112 (12/20 0830)  Labs: Recent Labs    09/28/19 1652 09/28/19 1858 09/29/19 0500 09/29/19 1837 09/30/19 0030 09/30/19 0500 09/30/19 1150  HGB 11.1*  --  10.5*  --   --  9.4*  --   HCT 34.7*  --  32.6*  --   --  29.6*  --   PLT 69*  --  54*  --   --  40*  --   LABPROT  --   --  12.9  --   --   --   --   INR  --   --  1.0  --   --   --   --   HEPARINUNFRC  --   --   --  0.44 0.37  --  0.28*  CREATININE 0.79  --  0.78  --   --   --   --   TROPONINIHS 11 10  --   --   --   --   --     Estimated Creatinine Clearance: 92.9 mL/min (by C-G formula based on SCr of 0.78 mg/dL).   Assessment: Pharmacy consulted to dose/monitor heparin in this 48 year old female with PMH significant for breast cancer on chemotherapy PTA with curative intent (Cytoxan + doxorubicin as well as Neulasta given 12/14). CT angio chest on 12/18 revealed acute bilateral lower lobe PE, acute occlusive SVC thrombosis. Vascular surgery consulted. Not on anticoagulation PTA. Pt also COVID +.  Baseline labs: -Hgb 11.1 -Plt 69 are low s/p chemotherapy  Today, 09/30/19  HL = 0.28 is slightly subtherapeutic on heparin infusion of 1500 units/hr  Confirmed with RN that heparin infusing at correct rate with no interruptions. No signs/symptoms of bleeding.   Hgb (9.4) is low, trending down  Plt (40) - low s/p chemotherapy, trending down  Goal of Therapy:  Heparin level 0.3-0.7 units/ml Monitor platelets by anticoagulation protocol: Yes   Plan:   No bolus given thrombocytopenia  Increase heparin infusion to 1650 units/hr  Check HL  in 6 hours  CBC, HL daily   Monitor closely for any signs/symptoms of bleeding given thrombocytopenia  Follow along for transition to PO or subQ anticoagulation  Lenis Noon, PharmD 09/30/2019,1:14 PM

## 2019-09-30 NOTE — ED Notes (Signed)
[facility] is here and she is taken from our department without incident.

## 2019-09-30 NOTE — H&P (View-Only) (Signed)
[facility]  Patient name: Veronica [name] MRN: [id number] DOB: 1971/01/03 Sex: female   REQUESTING PROVIDER:    ER   REASON FOR CONSULT:    SVC syndrome  HISTORY OF PRESENT ILLNESS:   Veronica [name] is a 48 y.o. female, who presented to the ER with facial and upper extremity swelling for several days.  CTA revealed PR and an occluded SVC.  She has a port in place for chemo for breast cancer.  She is currently ongoing treatment.  She tested positive for COVID.  She was saturating 100% on room air.  She was admitted and placed on IV heparin.  She states that her neck and face swelling has improved but her arms remain edematous.  She continues to be without hypoxia or shortness of breath or chest pain  PAST MEDICAL HISTORY    Past Medical History:  Diagnosis Date  . Cancer ([place])   . Chronic low back pain with left-sided sciatica   . Family history of leukemia   . Family history of stomach cancer   . Hypertension      FAMILY HISTORY   Family History  Problem Relation Age of Onset  . Arthritis Mother   . COPD Mother   . Hypertension Mother   . Hyperlipidemia Mother   . Leukemia Brother 10  . Stomach cancer Maternal Grandmother        late 53s    SOCIAL HISTORY:   Social History   Socioeconomic History  . Marital status: Married    Spouse name: Not on file  . Number of children: Not on file  . Years of education: Not on file  . Highest education level: Not on file  Occupational History  . Not on file  Tobacco Use  . Smoking status: Never Smoker  . Smokeless tobacco: Never Used  Substance and Sexual Activity  . Alcohol use: Yes    Alcohol/week: 2.0 standard drinks    Types: 2 Standard drinks or equivalent per week    Comment: "2-3 a week"  . Drug use: No  . Sexual activity: Yes    Birth control/protection: Surgical  Other Topics Concern  . Not on file  Social History Narrative  . Not on  file   Social Determinants of Health   Financial Resource Strain:   . Difficulty of Paying Living Expenses: Not on file  Food Insecurity:   . Worried About Charity fundraiser in the Last Year: Not on file  . Ran Out of Food in the Last Year: Not on file  Transportation Needs:   . Lack of Transportation (Medical): Not on file  . Lack of Transportation (Non-Medical): Not on file  Physical Activity:   . Days of Exercise per Week: Not on file  . Minutes of Exercise per Session: Not on file  Stress:   . Feeling of Stress : Not on file  Social Connections:   . Frequency of Communication with Friends and Family: Not on file  . Frequency of Social Gatherings with Friends and Family: Not on file  . Attends Religious Services: Not on file  . Active Member of Clubs or Organizations: Not on file  . Attends Archivist Meetings: Not on file  . Marital Status: Not on file  Intimate Partner Violence:   . Fear of Current or Ex-Partner: Not on file  . Emotionally Abused: Not on file  . Physically Abused: Not on file  . Sexually  Abused: Not on file    ALLERGIES:    No Known Allergies  CURRENT MEDICATIONS:    Current Facility-Administered Medications  Medication Dose Route Frequency Provider Last Rate Last Admin  . acetaminophen (TYLENOL) tablet 650 mg  650 mg Oral Q6H PRN Phineas Semen, MD       Or  . acetaminophen (TYLENOL) suppository 650 mg  650 mg Rectal Q6H PRN Chinbuah, Audley Hose, MD      . Chlorhexidine Gluconate Cloth 2 % PADS 6 each  6 each Topical Daily Pokhrel, Laxman, MD   6 each at 09/30/19 1718  . famotidine (PEPCID) tablet 10 mg  10 mg Oral BID Phineas Semen, MD   10 mg at 09/30/19 2200  . [START ON 10/01/2019] feeding supplement (ENSURE ENLIVE) (ENSURE ENLIVE) liquid 237 mL  237 mL Oral BID BM Pokhrel, Laxman, MD      . heparin ADULT infusion 100 units/mL (25000 units/235mL sodium chloride 0.45%)  1,650 Units/hr Intravenous Continuous Lenis Noon, RPH 16.5  mL/hr at 09/30/19 2206 1,650 Units/hr at 09/30/19 2206  . morphine 4 MG/ML injection 4 mg  4 mg Intravenous Once Domenic Moras, PA-C   Stopped at 09/28/19 2216  . polyethylene glycol (MIRALAX / GLYCOLAX) packet 17 g  17 g Oral Daily PRN Chinbuah, Audley Hose, MD        REVIEW OF SYSTEMS:   [X]  denotes positive finding, [ ]  denotes negative finding Cardiac  Comments:  Chest pain or chest pressure:    Shortness of breath upon exertion:    Short of breath when lying flat:    Irregular heart rhythm:        Vascular    Pain in calf, thigh, or hip brought on by ambulation:    Pain in feet at night that wakes you up from your sleep:     Blood clot in your veins:    Leg swelling:         Pulmonary    Oxygen at home:    Productive cough:     Wheezing:         Neurologic    Sudden weakness in arms or legs:     Sudden numbness in arms or legs:     Sudden onset of difficulty speaking or slurred speech:    Temporary loss of vision in one eye:     Problems with dizziness:         Gastrointestinal    Blood in stool:      Vomited blood:         Genitourinary    Burning when urinating:     Blood in urine:        Psychiatric    Major depression:         Hematologic    Bleeding problems:    Problems with blood clotting too easily:        Skin    Rashes or ulcers:        Constitutional    Fever or chills:     PHYSICAL EXAM:   Vitals:   09/30/19 1654 09/30/19 2000 09/30/19 2008 09/30/19 2022  BP: 117/82  120/64   Pulse: (!) 113   (!) 115  Resp: 19 (!) 23 18 10   Temp: 98.2 F (36.8 C)   98.1 F (36.7 C)  TempSrc: Oral   Oral  SpO2: 100% 95% 100% 95%  Weight:      Height:        GENERAL:  The patient is a well-nourished female, in no acute distress. The vital signs are documented above. CARDIAC: There is a regular rate and rhythm.  VASCULAR: upper extremity edema PULMONARY: Nonlabored respirations ABDOMEN: Soft and non-tender with normal pitched bowel sounds.   MUSCULOSKELETAL: There are no major deformities or cyanosis. NEUROLOGIC: No focal weakness or paresthesias are detected. SKIN: There are no ulcers or rashes noted. PSYCHIATRIC: The patient has a normal affect.  STUDIES:   I have reviewed her CTA with the following findings: 1. Acute segmental bilateral lower lobe pulmonary emboli. 2. Occlusive low-attenuation filling defect throughout the SVC surrounding the right IJ Port-A-Cath with extensive opacified venous collaterals throughout the mediastinum and ventral left chest wall draining to the IVC across the diaphragm. 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 3. Peripheral right lower lobe 2.9 cm focus of consolidation, indeterminate with differential including pulmonary infarct, pneumonia or neoplasm. Short-term follow-up chest CT advised in 3 months. 4. Small dependent bilateral pleural effusions. 5. Asymmetric right breast skin thickening with subcutaneous edema throughout ventral upper chest wall. Partially visualized fluid density focus in the lateral right breast, potentially representing postsurgical fluid collection. 6. Hypodense 1.6 cm anterior right thyroid nodule. No follow-up recommended unless clinically warranted (ref: J Am Coll Radiol. 2015 Feb;12(2): 143-50).  ASSESSMENT and PLAN   SVC syndrome:  I discussed with the patient intervention for her symptomatic SVC occlusion.  I will plan on using the INARiI device, likely getting access from the groin and right arm.  There is the possibility that the angiovac device will be required if I can not get a good result.  This would require a second operation.  I will more than likely have to remove her port.  She is scheduled for Tuesday.  She should be NPO after midnight.  The details of the procedure were discussed with the patient, she verbalized understanding and wishes to proceed.  Continue IV heparin  Continue to monitor  thrombocytopenia   Annamarie Major, IV, MD, FACS [facility] [phone] Pager [phone]

## 2019-10-01 ENCOUNTER — Ambulatory Visit (HOSPITAL_COMMUNITY): Payer: 59

## 2019-10-01 LAB — CBC
HCT: 27.8 % — ABNORMAL LOW (ref 36.0–46.0)
HCT: 31.7 % — ABNORMAL LOW (ref 36.0–46.0)
Hemoglobin: 9.2 g/dL — ABNORMAL LOW (ref 12.0–15.0)
Hemoglobin: 9.9 g/dL — ABNORMAL LOW (ref 12.0–15.0)
MCH: 30.2 pg (ref 26.0–34.0)
MCH: 30.4 pg (ref 26.0–34.0)
MCHC: 33.1 g/dL (ref 30.0–36.0)
MCHC: 31.2 g/dL (ref 30.0–36.0)
MCV: 91.1 fL (ref 80.0–100.0)
MCV: 97.2 fL (ref 80.0–100.0)
Platelets: 33 10*3/uL — ABNORMAL LOW (ref 150–400)
Platelets: 45 10*3/uL — ABNORMAL LOW (ref 150–400)
RBC: 3.05 MIL/uL — ABNORMAL LOW (ref 3.87–5.11)
RBC: 3.26 MIL/uL — ABNORMAL LOW (ref 3.87–5.11)
RDW: 14.3 % (ref 11.5–15.5)
RDW: 14.3 % (ref 11.5–15.5)
WBC: 1 10*3/uL — CL (ref 4.0–10.5)
WBC: 0.9 10*3/uL — CL (ref 4.0–10.5)
nRBC: 0 % (ref 0.0–0.2)
nRBC: 0 % (ref 0.0–0.2)

## 2019-10-01 LAB — C-REACTIVE PROTEIN: CRP: 2 mg/dL — ABNORMAL HIGH (ref <1.0)

## 2019-10-01 LAB — ABO/RH: ABO/RH(D): A POS

## 2019-10-01 LAB — FERRITIN: Ferritin: 663 ng/mL — ABNORMAL HIGH (ref 11–307)

## 2019-10-01 LAB — HEPARIN LEVEL (UNFRACTIONATED): Heparin Unfractionated: 0.45 IU/mL (ref 0.30–0.70)

## 2019-10-01 MED ORDER — CIPROFLOXACIN IN D5W 400 MG/200ML IV SOLN
400.0000 mg | Freq: Two times a day (BID) | INTRAVENOUS | Status: DC
  Administered 2019-10-01 – 2019-10-03 (×5): 400 mg via INTRAVENOUS
  Filled 2019-10-01 (×6): qty 200

## 2019-10-01 MED ORDER — SODIUM CHLORIDE 0.9 % IV SOLN
100.0000 mg | Freq: Every day | INTRAVENOUS | Status: AC
  Administered 2019-10-02 – 2019-10-05 (×4): 100 mg via INTRAVENOUS
  Filled 2019-10-01 (×4): qty 20

## 2019-10-01 MED ORDER — SODIUM CHLORIDE 0.9 % IV SOLN
200.0000 mg | Freq: Once | INTRAVENOUS | Status: AC
  Administered 2019-10-01: 200 mg via INTRAVENOUS
  Filled 2019-10-01: qty 40

## 2019-10-01 MED ORDER — SODIUM CHLORIDE 0.9% IV SOLUTION: Freq: Once | INTRAVENOUS | Status: AC

## 2019-10-01 MED ORDER — ENSURE ENLIVE PO LIQD
237.0000 mL | Freq: Three times a day (TID) | ORAL | Status: DC
  Administered 2019-10-01 – 2019-10-19 (×29): 237 mL via ORAL
  Filled 2019-10-01: qty 237

## 2019-10-01 NOTE — Progress Notes (Signed)
Patient states that right face and neck feel mor swollen. Patient get choked if laying on right face. Patient denies increase of SOB with increase of swelling. Ice pack given to pace for face and head. Patient denies needs at this time. Will continue to monitor.

## 2019-10-01 NOTE — Progress Notes (Signed)
PROGRESS NOTE  [name] DOB: 1970/11/25 DOA: 09/28/2019 PCP: Blair Heys, PA-C   LOS: 3 days   Brief narrative: As per HPI: Veronica Shavers Subotnikis a 48 y.o.femalewith medical history significant for breast cancer currently on chemotherapy presented to hospital with complaints of increasing facial and neck swelling associated with redness. She also reported of bilateral upper extremity swelling especially right upper extremity. Based on her worsening symptoms, she was seen by her provider who ordered CTAof the chest. It showed bilateral pulmonary embolism and occlusion of SVC, she was referred to ED for further management.   ED Course:She was hemodynamically stable at the ED and afebrile. She was saturating 100% on room air.Patient was started on heparin drip. Case was discussed case with vascular surgeon Dr. Trula Slade. Admitted to hospitalist services for further evaluation and management Incidentally, she was positive for COVID-19 PCR.  Assessment/Plan:  Principal Problem:   Multiple subsegmental pulmonary emboli without acute cor pulmonale (HCC) Active Problems:   Malignant neoplasm of upper-outer quadrant of right breast in female, estrogen receptor positive (HCC)   Thrombocytopenia (HCC)   Superior vena cava thrombosis (HCC)   SVCO (superior vena cava obstruction)   SVC syndrome  Acute SVC syndrome/Acute pulmonary embolism Likely secondary to malignancy.  Currently on heparin drip.  Vascular surgery planning for mechanical thrombectomy by 09/30/2019.    COVID-19 infection Currently asymptomatic without hypoxia.  Ferritin elevated at 660, CRP at 2.0.  Will avoid dexamethasone for now.  We will put the patient on remdesivir but hold off with dexamethasone.  Thrombocytopenia We will closely monitor on heparin drip.  Platelets of 40,000 yesterday 33,000 today.  Spoke with Dr. Jana Hakim oncology.  Recommend transfusion of platelets.  Could potentially undergo  procedure tomorrow with platelet transfusion if necessary.  Leukocytosis to leukopenia Afebrile with no obvious sign of infection or fever..  Will closely monitor CBC. Severe leukopenia noted today.   Will get CBC with differential in a.m.  Looks like the patient had received the Neulasta with her treatment so no further recommendations on that.  Dr. Jana Hakim is  okay with remdesivir for Covid.  We will start the patient on prophylactic ciprofloxacin for now.  Hypokalemia Improved with replacement. Check bmp in am  Hypertension Blood pressure stable so far.  Will closely monitor.  Breast cancer on chemotherapy Oncology on board.  VTE Prophylaxis: Heparin drip  Code Status: Full code  Family Communication: Spoke with the patient's family at the patient's bedside through video.  Disposition Plan: Home   Consultants:  Oncology,   vascular surgery  Procedures:  None so far  Antibiotics: Anti-infectives (From admission, onward)   None     Subjective: The, patient complains of mild facial swelling and headache.  No dyspnea cough fever or chills.  Denies sore throat.  Objective: Vitals:   10/01/19 0620 10/01/19 0625  BP:    Pulse: (!) 126 (!) 104  Resp: 17 18  Temp:    SpO2: 96% 94%    Intake/Output Summary (Last 24 hours) at 10/01/2019 0734 Last data filed at 10/01/2019 0700 Gross per 24 hour  Intake 761.51 ml  Output -  Net 761.51 ml   Filed Weights   09/28/19 1659  Weight: 95.7 kg   Body mass index is 39.87 kg/m.   Physical Exam: GENERAL: Patient is alert awake and oriented. Not in obvious distress. HENT: Mild puffiness of the face with erythema and fullness of the right side of the neck. NECK: is supple, no palpable  thyroid enlargement. CHEST: Clear to auscultation. No crackles or wheezes. Non tender on palpation. Diminished breath sounds bilaterally.  Port right chest wall. CVS: S1 and S2 heard, no murmur. Regular rate and rhythm. No pericardial  rub. ABDOMEN: Soft, non-tender, bowel sounds are present. No palpable hepato-splenomegaly. EXTREMITIES: No edema. CNS: Cranial nerves are intact. No focal motor or sensory deficits. SKIN: warm and dry without rashes.  Data Review: I have personally reviewed the following laboratory data and studies,  CBC: Recent Labs  Lab 09/24/19 1005 09/28/19 1652 09/29/19 0500 09/30/19 0500  WBC 6.5 28.3* 14.6* 2.8*  NEUTROABS 4.9 27.1*  --   --   HGB 11.0* 11.1* 10.5* 9.4*  HCT 33.6* 34.7* 32.6* 29.6*  MCV 91.3 94.0 94.2 93.4  PLT 178 69* 54* 40*   Basic Metabolic Panel: Recent Labs  Lab 09/24/19 1005 09/28/19 1652 09/29/19 0500  NA 138 136 137  K 3.6 3.3* 3.7  CL 106 100 102  CO2 22 21* 22  GLUCOSE 127* 171* 148*  BUN 7 24* 23*  CREATININE 0.64 0.79 0.78  CALCIUM 9.1 9.6 9.5   Liver Function Tests: Recent Labs  Lab 09/24/19 1005 09/29/19 0500  AST 28 23  ALT 34 34  ALKPHOS 69 89  BILITOT 0.9 1.4*  PROT 6.4* 6.1*  ALBUMIN 3.7 3.7   No results for input(s): LIPASE, AMYLASE in the last 168 hours. No results for input(s): AMMONIA in the last 168 hours. Cardiac Enzymes: No results for input(s): CKTOTAL, CKMB, CKMBINDEX, TROPONINI in the last 168 hours. BNP (last 3 results) Recent Labs    09/01/19 1931 09/05/19 1030 09/28/19 1652  BNP 222.0* 60.3 335.7*    ProBNP (last 3 results) No results for input(s): PROBNP in the last 8760 hours.  CBG: No results for input(s): GLUCAP in the last 168 hours. Recent Results (from the past 240 hour(s))  SARS CORONAVIRUS 2 (TAT 6-24 HRS) Nasopharyngeal Nasopharyngeal Swab     Status: Abnormal   Collection Time: 09/28/19  4:52 PM   Specimen: Nasopharyngeal Swab  Result Value Ref Range Status   SARS Coronavirus 2 POSITIVE (A) NEGATIVE Final    Comment: RESULT CALLED TO, READ BACK BY AND VERIFIED WITH: H. COOK,RN [id number] 09/29/2019 T. TYSOR (NOTE) SARS-CoV-2 target nucleic acids are DETECTED. The SARS-CoV-2 RNA is generally  detectable in upper and lower respiratory specimens during the acute phase of infection. Positive results are indicative of the presence of SARS-CoV-2 RNA. Clinical correlation with patient history and other diagnostic information is  necessary to determine patient infection status. Positive results do not rule out bacterial infection or co-infection with other viruses.  The expected result is Negative. Fact Sheet for Patients: SugarRoll.be Fact Sheet for Healthcare Providers: https://www.woods-mathews.com/ This test is not yet approved or cleared by the [place] FDA and  has been authorized for detection and/or diagnosis of SARS-CoV-2 by FDA under an Emergency Use Authorization (EUA). This EUA will remain  in effect (meaning this test can be used) for the  duration of the COVID-19 declaration under Section 564(b)(1) of the Act, 21 U.S.C. section 360bbb-3(b)(1), unless the authorization is terminated or revoked sooner. Performed at [facility], [place] [address] [place], [place] [address]      Studies: ECHOCARDIOGRAM COMPLETE  Result Date: 09/30/2019   ECHOCARDIOGRAM REPORT   Patient Name:   SAMMIJO NAVAL Date of Exam: 09/30/2019 Medical Rec #:  [id number]      Height:       61.0 in Accession #:  [id number]     Weight:       211.0 lb Date of Birth:  1971-07-01      BSA:          1.93 m Patient Age:    50 years       BP:           164/83 mmHg Patient Gender: F              HR:           111 bpm. Exam Location:  Inpatient Procedure: 2D Echo, Color Doppler, Cardiac Doppler and Intracardiac            Opacification Agent Indications:    I26.02 Pulmonary embolus  History:        Patient has prior history of Echocardiogram examinations, most                 recent 09/19/2019. Risk Factors:Hypertension. SVC thrombus at                 time of exam.  Sonographer:    Raquel Sarna Senior RDCS Referring Phys: [id number] [place]  1. Left  ventricular ejection fraction, by visual estimation, is 60 to 65%. The left ventricle has normal function. There is no left ventricular hypertrophy.  2. The left ventricle has no regional wall motion abnormalities.  3. Global right ventricle has normal systolic function.The right ventricular size is normal. No increase in right ventricular wall thickness.  4. Left atrial size was normal.  5. Right atrial size was normal.  6. Small pericardial effusion.  7. The mitral valve is grossly normal. No evidence of mitral valve regurgitation.  8. The tricuspid valve is grossly normal. Tricuspid valve regurgitation is trivial.  9. The aortic valve is grossly normal. Aortic valve regurgitation is not visualized. No evidence of aortic valve sclerosis or stenosis. 10. The pulmonic valve was grossly normal. Pulmonic valve regurgitation is not visualized. 11. The inferior vena cava is normal in size with <50% respiratory variability, suggesting right atrial pressure of 8 mmHg. 12. The atrial septum is grossly normal. FINDINGS  Left Ventricle: Left ventricular ejection fraction, by visual estimation, is 60 to 65%. The left ventricle has normal function. The left ventricle has no regional wall motion abnormalities. There is no left ventricular hypertrophy. Left ventricular diastolic parameters were normal. Right Ventricle: The right ventricular size is normal. No increase in right ventricular wall thickness. Global RV systolic function is has normal systolic function. Left Atrium: Left atrial size was normal in size. Right Atrium: Right atrial size was normal in size Pericardium: A small pericardial effusion is present. Mitral Valve: The mitral valve is grossly normal. No evidence of mitral valve regurgitation. Tricuspid Valve: The tricuspid valve is grossly normal. Tricuspid valve regurgitation is trivial. Aortic Valve: The aortic valve is grossly normal. Aortic valve regurgitation is not visualized. The aortic valve is  structurally normal, with no evidence of sclerosis or stenosis. Pulmonic Valve: The pulmonic valve was grossly normal. Pulmonic valve regurgitation is not visualized. Pulmonic regurgitation is not visualized. Aorta: The aortic root, ascending aorta and aortic arch are all structurally normal, with no evidence of dilitation or obstruction. Venous: The inferior vena cava is normal in size with less than 50% respiratory variability, suggesting right atrial pressure of 8 mmHg. IAS/Shunts: The atrial septum is grossly normal.  LEFT VENTRICLE PLAX 2D LVIDd:         4.00 cm  Diastology LVIDs:  2.80 cm  LV e' lateral:   7.29 cm/s LV PW:         0.80 cm  LV E/e' lateral: 7.9 LV IVS:        0.80 cm  LV e' medial:    6.42 cm/s LVOT diam:     1.90 cm  LV E/e' medial:  9.0 LV SV:         40 ml LV SV Index:   19.43 LVOT Area:     2.84 cm  RIGHT VENTRICLE RV S prime:     19.10 cm/s TAPSE (M-mode): 2.3 cm LEFT ATRIUM             Index       RIGHT ATRIUM           Index LA diam:        3.00 cm 1.55 cm/m  RA Area:     14.30 cm LA Vol (A2C):   25.3 ml 13.09 ml/m RA Volume:   30.30 ml  15.68 ml/m LA Vol (A4C):   35.9 ml 18.58 ml/m LA Biplane Vol: 31.6 ml 16.35 ml/m  AORTIC VALVE LVOT Vmax:   103.00 cm/s LVOT Vmean:  61.300 cm/s LVOT VTI:    0.160 m  AORTA Ao Root diam: 2.30 cm MITRAL VALVE MV Area (PHT): 3.99 cm             SHUNTS MV PHT:        55.10 msec           Systemic VTI:  0.16 m MV Decel Time: 190 msec             Systemic Diam: 1.90 cm MV E velocity: 57.80 cm/s 103 cm/s MV A velocity: 73.70 cm/s 70.3 cm/s MV E/A ratio:  0.78       1.5  Buford Dresser MD Electronically signed by Buford Dresser MD Signature Date/Time: 09/30/2019/3:42:47 PM    Final     Scheduled Meds: . Chlorhexidine Gluconate Cloth  6 each Topical Daily  . famotidine  10 mg Oral BID  . feeding supplement (ENSURE ENLIVE)  237 mL Oral BID BM  .  morphine injection  4 mg Intravenous Once    Continuous Infusions: . heparin  1,650 Units/hr (10/01/19 0700)     Flora Lipps, MD  [facility] 10/01/2019

## 2019-10-01 NOTE — Progress Notes (Signed)
CRITICAL VALUE ALERT  Critical Value: wbc 1.0  Date & Time Notied:  10/01/2019 0825  Provider Notified: yes; Dr. Louanne Belton via page  Orders Received/Actions taken:  No orders/response received

## 2019-10-01 NOTE — Progress Notes (Signed)
Pharmacy Consult - Remdesivir  54 yoF  presenting COVID-19 positive with respiratory symptoms requiring hospitalization. Pharmacy consulted to dose Remdesivir. ALT<220.   Plan: Remdesivir 200mg  IV x 1; then 100mg  IV q24h to complete 5 total doses Monitor clinical progress, ALT  Thank you Anette Guarneri, PharmD

## 2019-10-01 NOTE — Progress Notes (Signed)
Initial Nutrition Assessment  RD working remotely.  DOCUMENTATION CODES:   Obesity unspecified  INTERVENTION:   - Ensure Enlive po TID, each supplement provides 350 kcal and 20 grams of protein (vanilla flavor)  - Encourage adequate PO intake  NUTRITION DIAGNOSIS:   Increased nutrient needs related to chronic illness, acute illness (breast cancer undergoing chemotherapy, COVID-19) as evidenced by estimated needs.  GOAL:   Patient will meet greater than or equal to 90% of their needs  MONITOR:   PO intake, Supplement acceptance, Labs, Weight trends, I & O's  REASON FOR ASSESSMENT:   Malnutrition Screening Tool    ASSESSMENT:   48 year old female who presented to the ED on 12/18 for further management of newly diagnosed PE and occlusion of SVC. PMH of breast cancer currently undergoing chemotherapy. Pt also found to be COVID-19 positive.   Noted plans for vascular surgery to do mechanical thrombectomy tomorrow, 12/22.  Pt on a Regular diet. However, no meal completions documented since admission.  Spoke with pt via phone call to room. Pt states that her breakfast didn't arrive until 11:00 am and that she had some eggs, bacon, and potatoes. Pt reports her lunch arrived shortly after and she did not feel like eating it because she had just eaten breakfast and drank an Ensure.  Pt reports that her appetite is "okay" but that she gets full after a few bites of each food on her plate. Pt reports that this is normal for her in the second week after a chemo treatment. Pt states that as time passes after chemo, her appetite picks back up.  Pt reports noticing food tasting weird since starting treatment. RD provided education on ways to handle this symptom.  Pt reports that at home, she eats 1-2 meals daily depending on how she feels and how much of an appetite she has. Pt states that if she eats just 1 meal, she will drink 2 Boost supplement. If she eats 2 meals, she will drink 1  Boost supplement. Pt willing to consume Ensure Enlive during admission.  Pt reports that her UBW prior to beginning treatment was 232 lbs (July 2020). Pt states that her weight fluctuates an average of 5 lbs between chemo treatments. Pt states that after her last treatment, she was in the hospital and may have lost some weight at that time.  Weight of 211 lbs on admission appears stated rather than measured. If accurate, pt has experienced a 6.9 kg weight loss since 09/03/19. This is a 6.7% weight loss in less than 1 month which is significant for timeframe. Pt is at risk for malnutrition.  Per RN edema assessment, pt with mild pitting edema to BUE as well as facial edema.  Medications reviewed and include: Pepcid, Ensure Enlive BID, IV abx, heparin remdesivir  Labs reviewed: hemoglobin 9.2, WBC 1.0  NUTRITION - FOCUSED PHYSICAL EXAM:  Unable to complete at this time. RD working remotely.  Diet Order:   Diet Order            Diet regular Room service appropriate? Yes; Fluid consistency: Thin  Diet effective now              EDUCATION NEEDS:   Education needs have been addressed  Skin:  Skin Assessment: Reviewed RN Assessment  Last BM:  10/01/19  Height:   Ht Readings from Last 1 Encounters:  09/28/19 5\' 1"  (1.549 m)    Weight:   Wt Readings from Last 1 Encounters:  09/28/19 95.7 kg  Ideal Body Weight:  47.7 kg  BMI:  Body mass index is 39.87 kg/m.  Estimated Nutritional Needs:   Kcal:  2000-2200  Protein:  100-115 grams  Fluid:  >/= 2.0 L    Gaynell Face, MS, RD, LDN Inpatient Clinical Dietitian Pager: [phone] Weekend/After Hours: [phone]

## 2019-10-01 NOTE — Progress Notes (Signed)
ANTICOAGULATION CONSULT NOTE - Follow Up Consult  Pharmacy Consult for heparin Indication: pulmonary embolus  No Known Allergies  Patient Measurements: Height: 5\' 1"  (154.9 cm) Weight: 211 lb (95.7 kg) IBW/kg (Calculated) : 47.8 Heparin Dosing Weight: 71 kg  Vital Signs: Temp: 98.2 F (36.8 C) (12/21 0416) Temp Source: Oral (12/21 0416) BP: 105/49 (12/21 0416) Pulse Rate: 104 (12/21 0625)  Labs: Recent Labs    09/28/19 1652 09/28/19 1858 09/29/19 0500 09/30/19 0500 09/30/19 1150 09/30/19 2048 10/01/19 0705  HGB 11.1*  --  10.5* 9.4*  --   --  9.2*  HCT 34.7*  --  32.6* 29.6*  --   --  27.8*  PLT 69*  --  54* 40*  --   --  33*  LABPROT  --   --  12.9  --   --   --   --   INR  --   --  1.0  --   --   --   --   HEPARINUNFRC  --   --   --   --  0.28* 0.43 0.45  CREATININE 0.79  --  0.78  --   --   --   --   TROPONINIHS 11 10  --   --   --   --   --     Estimated Creatinine Clearance: 92.9 mL/min (by C-G formula based on SCr of 0.78 mg/dL).   Assessment: Pharmacy consulted to dose/monitor heparin in this 48 year old female with PMH significant for breast cancer on chemotherapy PTA with curative intent (Cytoxan + doxorubicin as well as Neulasta given 12/14). CT angio chest on 12/18 revealed acute bilateral lower lobe PE, acute occlusive SVC thrombosis. Vascular surgery consulted. Not on anticoagulation PTA. Pt also COVID +.  Heparin level now therapeutic, pltc remains low likely 2/2 chemotherapy.  Goal of Therapy:  Heparin level 0.3-0.7 units/ml Monitor platelets by anticoagulation protocol: Yes   Plan:  -Continue heparin 1650 units/h -Daily heparin level and CBC  Thank you Anette Guarneri, PharmD  Please check AMION for all [facility] Pharm[facility] numbers 10/01/2019

## 2019-10-01 NOTE — Progress Notes (Signed)
One unit of platelets completed without any signs or symptoms of transfusion noted. Repeat CBC ordered for two hours post platelet end time.

## 2019-10-02 ENCOUNTER — Inpatient Hospital Stay: Payer: 59 | Admitting: Adult Health

## 2019-10-02 ENCOUNTER — Inpatient Hospital Stay (HOSPITAL_COMMUNITY): Payer: 59 | Admitting: Certified Registered Nurse Anesthetist

## 2019-10-02 ENCOUNTER — Inpatient Hospital Stay (HOSPITAL_COMMUNITY): Payer: 59

## 2019-10-02 ENCOUNTER — Inpatient Hospital Stay: Payer: 59

## 2019-10-02 ENCOUNTER — Encounter (HOSPITAL_COMMUNITY)
Admission: EM | Disposition: A | Discharge status: Home / Self Care | Payer: Self-pay | Source: Home / Self Care | Attending: Internal Medicine

## 2019-10-02 ENCOUNTER — Encounter (HOSPITAL_COMMUNITY): Payer: Self-pay | Admitting: Internal Medicine

## 2019-10-02 DIAGNOSIS — J9600 Acute respiratory failure, unspecified whether with hypoxia or hypercapnia: Secondary | ICD-10-CM

## 2019-10-02 DIAGNOSIS — I314 Cardiac tamponade: Secondary | ICD-10-CM

## 2019-10-02 DIAGNOSIS — I312 Hemopericardium, not elsewhere classified: Secondary | ICD-10-CM

## 2019-10-02 HISTORY — PX: VENOGRAM: SHX5497

## 2019-10-02 HISTORY — PX: PORT-A-CATH REMOVAL: SHX5289

## 2019-10-02 HISTORY — PX: PERICARDIAL WINDOW: SHX2213

## 2019-10-02 HISTORY — PX: CENTRAL VENOUS CATHETER INSERTION: SHX401

## 2019-10-02 HISTORY — PX: ULTRASOUND GUIDANCE FOR VASCULAR ACCESS: SHX6516

## 2019-10-02 LAB — POCT I-STAT 7, (LYTES, BLD GAS, ICA,H+H)
Acid-base deficit: 4 mmol/L — ABNORMAL HIGH (ref 0.0–2.0)
Bicarbonate: 21.9 mmol/L (ref 20.0–28.0)
Calcium, Ion: 1.39 mmol/L (ref 1.15–1.40)
HCT: 28 % — ABNORMAL LOW (ref 36.0–46.0)
Hemoglobin: 9.5 g/dL — ABNORMAL LOW (ref 12.0–15.0)
O2 Saturation: 100 %
Patient temperature: 98
Potassium: 4.1 mmol/L (ref 3.5–5.1)
Sodium: 133 mmol/L — ABNORMAL LOW (ref 135–145)
TCO2: 23 mmol/L (ref 22–32)
pCO2 arterial: 40.7 mmHg (ref 32.0–48.0)
pH, Arterial: 7.338 — ABNORMAL LOW (ref 7.350–7.450)
pO2, Arterial: 353 mmHg — ABNORMAL HIGH (ref 83.0–108.0)

## 2019-10-02 LAB — CBC WITH DIFFERENTIAL/PLATELET
Abs Immature Granulocytes: 0 10*3/uL (ref 0.00–0.07)
Basophils Absolute: 0 10*3/uL (ref 0.0–0.1)
Basophils Relative: 3 %
Eosinophils Absolute: 0.1 10*3/uL (ref 0.0–0.5)
Eosinophils Relative: 16 %
HCT: 27.9 % — ABNORMAL LOW (ref 36.0–46.0)
Hemoglobin: 9.1 g/dL — ABNORMAL LOW (ref 12.0–15.0)
Immature Granulocytes: 0 %
Lymphocytes Relative: 47 %
Lymphs Abs: 0.4 10*3/uL — ABNORMAL LOW (ref 0.7–4.0)
MCH: 30 pg (ref 26.0–34.0)
MCHC: 32.6 g/dL (ref 30.0–36.0)
MCV: 92.1 fL (ref 80.0–100.0)
Monocytes Absolute: 0.1 10*3/uL (ref 0.1–1.0)
Monocytes Relative: 11 %
Neutro Abs: 0.2 10*3/uL — ABNORMAL LOW (ref 1.7–7.7)
Neutrophils Relative %: 23 %
Platelets: 50 10*3/uL — ABNORMAL LOW (ref 150–400)
RBC: 3.03 MIL/uL — ABNORMAL LOW (ref 3.87–5.11)
RDW: 14.2 % (ref 11.5–15.5)
WBC: 0.8 10*3/uL — CL (ref 4.0–10.5)
nRBC: 0 % (ref 0.0–0.2)

## 2019-10-02 LAB — COMPREHENSIVE METABOLIC PANEL
ALT: 28 U/L (ref 0–44)
AST: 18 U/L (ref 15–41)
Albumin: 3.7 g/dL (ref 3.5–5.0)
Alkaline Phosphatase: 68 U/L (ref 38–126)
Anion gap: 15 (ref 5–15)
BUN: 8 mg/dL (ref 6–20)
CO2: 20 mmol/L — ABNORMAL LOW (ref 22–32)
Calcium: 9.7 mg/dL (ref 8.9–10.3)
Chloride: 101 mmol/L (ref 98–111)
Creatinine, Ser: 0.72 mg/dL (ref 0.44–1.00)
GFR calc Af Amer: >60 mL/min (ref >60)
GFR calc non Af Amer: >60 mL/min (ref >60)
Glucose, Bld: 159 mg/dL — ABNORMAL HIGH (ref 70–99)
Potassium: 3.6 mmol/L (ref 3.5–5.1)
Sodium: 136 mmol/L (ref 135–145)
Total Bilirubin: 0.9 mg/dL (ref 0.3–1.2)
Total Protein: 6.1 g/dL — ABNORMAL LOW (ref 6.5–8.1)

## 2019-10-02 LAB — D-DIMER, QUANTITATIVE: D-Dimer, Quant: 1.29 ug/mL-FEU — ABNORMAL HIGH (ref 0.00–0.50)

## 2019-10-02 LAB — PREPARE PLATELET PHERESIS: Unit division: 0

## 2019-10-02 LAB — BPAM PLATELET PHERESIS
Blood Product Expiration Date: 202012212359
ISSUE DATE / TIME: 202012211435
Unit Type and Rh: 600

## 2019-10-02 LAB — MRSA PCR SCREENING: MRSA by PCR: NEGATIVE

## 2019-10-02 LAB — MAGNESIUM: Magnesium: 1.8 mg/dL (ref 1.7–2.4)

## 2019-10-02 LAB — ECHO INTRAOPERATIVE TEE
Height: 61 in
Weight: 3376 oz

## 2019-10-02 LAB — HEPARIN LEVEL (UNFRACTIONATED): Heparin Unfractionated: 0.27 IU/mL — ABNORMAL LOW (ref 0.30–0.70)

## 2019-10-02 LAB — FERRITIN: Ferritin: 786 ng/mL — ABNORMAL HIGH (ref 11–307)

## 2019-10-02 LAB — C-REACTIVE PROTEIN: CRP: 2.6 mg/dL — ABNORMAL HIGH (ref <1.0)

## 2019-10-02 LAB — PREPARE RBC (CROSSMATCH)

## 2019-10-02 IMAGING — CR DG CHEST 1V PORT
1 series · 1 of 1 positions shown · non-contrast
Comparison: Chest x-ray of [DATE]
COMPARISON: Chest x-ray of [DATE]

Addendum:
CLINICAL DATA: Instrument count, emergent case. Rule out foreign
body in chest cavity.

EXAM:
PORTABLE CHEST 1 VIEW

[AP]
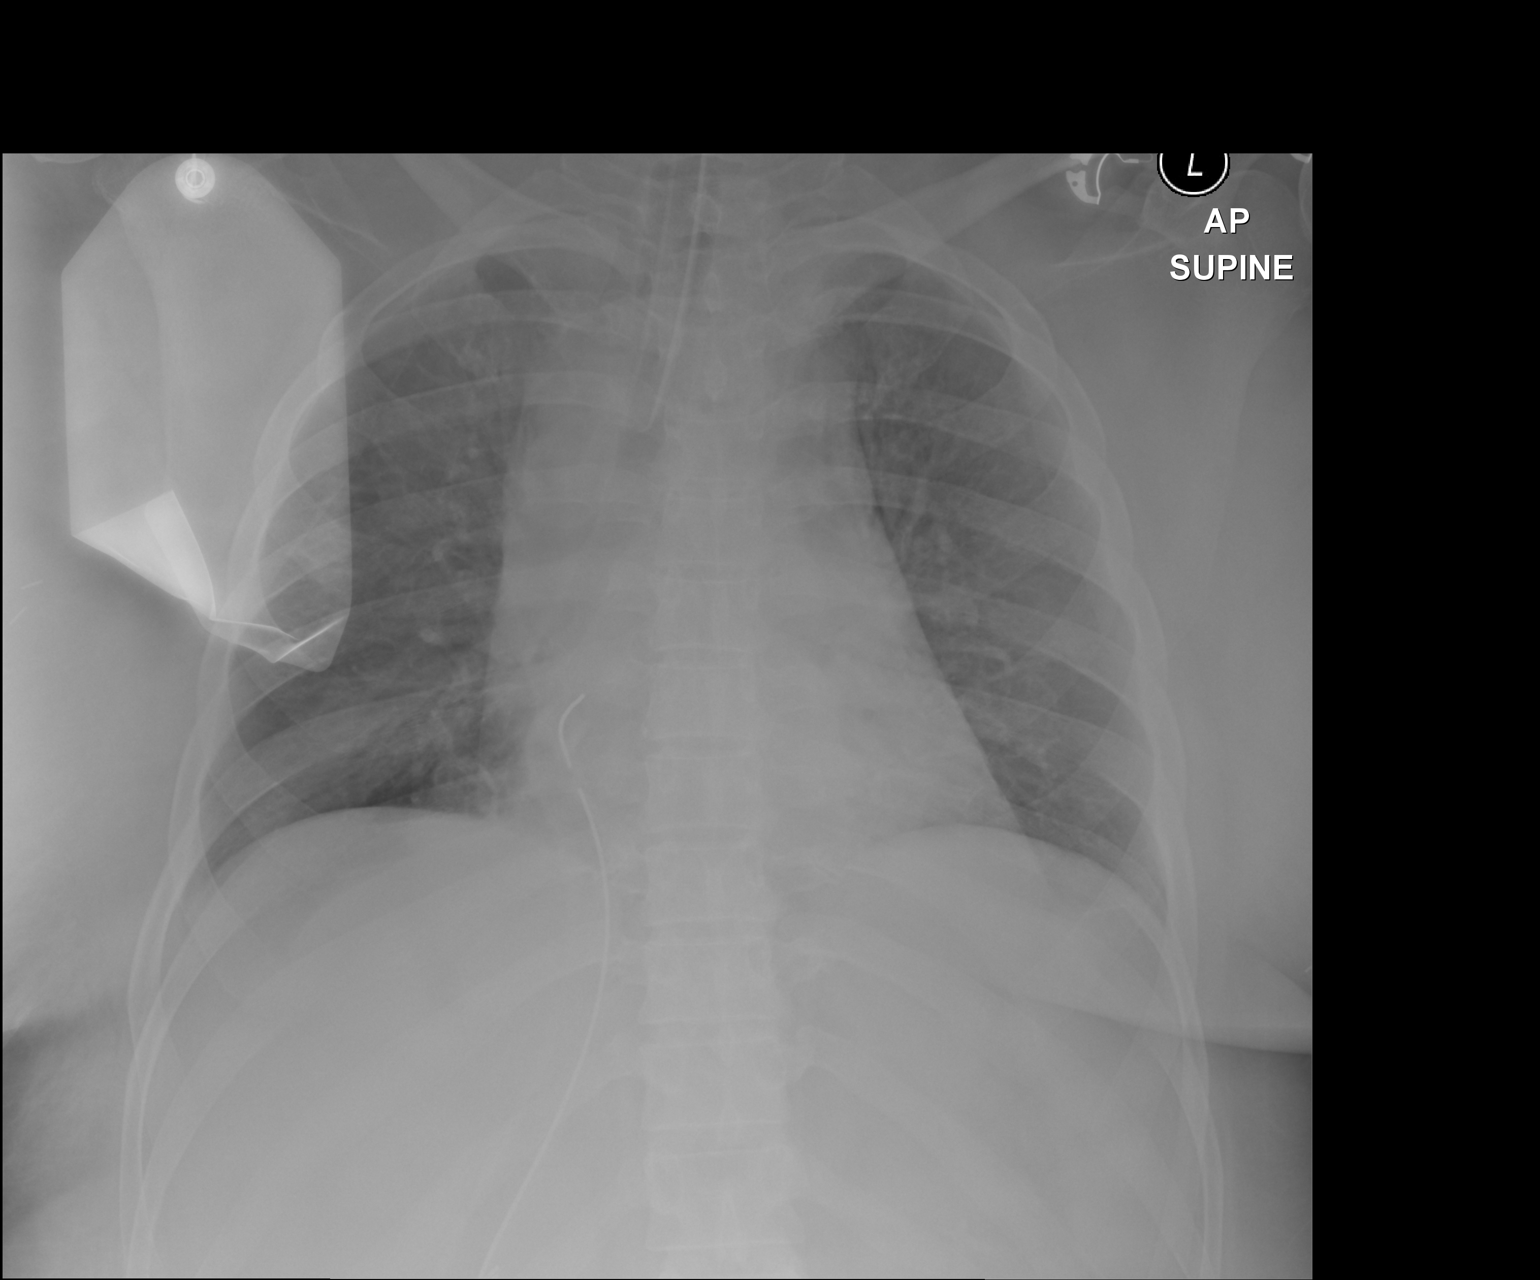

[1 of 1 positions shown; findings below may reference images not displayed]

FINDINGS: Endotracheal tube approximately 3 cm above the carina. Mild shift
from right to left of the trachea. Mild widening of mediastinal
contours, potentially JOHAN somewhat accentuated by AP position.

Right-sided chest tube or mediastinal drain is seen passing into the
right cardiophrenic region.

Lungs with mild increased interstitial markings or vascular
crowding. No signs of radiopaque foreign body, unexpected with pacer
defibrillator pad projecting over right upper chest.

No acute bone process.
IMPRESSION: 1. No unexpected radiopaque foreign body.
2. Mild widening of the mediastinal contours, potentially extension
weighted by position but likely related to recent surgery in this
location given right-sided chest tube.
3. Right-sided chest tube or mediastinal drain. No signs of
pneumothorax.
4. A call is out to the referring provider further discuss findings
in the above case.

ADDENDUM:
These results were called by telephone at the time of interpretation
on [DATE] at [DATE] to provider JOHAN , who verbally
acknowledged these results.

*** End of Addendum ***
FINDINGS: Endotracheal tube approximately 3 cm above the carina. Mild shift
from right to left of the trachea. Mild widening of mediastinal
contours, potentially JOHAN somewhat accentuated by AP position.

Right-sided chest tube or mediastinal drain is seen passing into the
right cardiophrenic region.

Lungs with mild increased interstitial markings or vascular
crowding. No signs of radiopaque foreign body, unexpected with pacer
defibrillator pad projecting over right upper chest.

No acute bone process.
IMPRESSION: 1. No unexpected radiopaque foreign body.
2. Mild widening of the mediastinal contours, potentially extension
weighted by position but likely related to recent surgery in this
location given right-sided chest tube.
3. Right-sided chest tube or mediastinal drain. No signs of
pneumothorax.
4. A call is out to the referring provider further discuss findings
in the above case.

## 2019-10-02 SURGERY — THROMBECTOMY, PERCUTANEOUS, TRANSLUMINAL
Anesthesia: General

## 2019-10-02 MED ORDER — SODIUM CHLORIDE 0.9 % IV SOLN
INTRAVENOUS | Status: AC
  Filled 2019-10-02: qty 1.2

## 2019-10-02 MED ORDER — PROPOFOL 10 MG/ML IV BOLUS
INTRAVENOUS | Status: DC | PRN
  Administered 2019-10-02: 50 mg via INTRAVENOUS
  Administered 2019-10-02: 150 mg via INTRAVENOUS

## 2019-10-02 MED ORDER — LIDOCAINE 2% (20 MG/ML) 5 ML SYRINGE
INTRAMUSCULAR | Status: DC | PRN
  Administered 2019-10-02: 60 mg via INTRAVENOUS

## 2019-10-02 MED ORDER — ORAL CARE MOUTH RINSE
15.0000 mL | OROMUCOSAL | Status: DC
  Administered 2019-10-02 – 2019-10-03 (×6): 15 mL via OROMUCOSAL

## 2019-10-02 MED ORDER — ROCURONIUM BROMIDE 10 MG/ML (PF) SYRINGE
PREFILLED_SYRINGE | INTRAVENOUS | Status: DC | PRN
  Administered 2019-10-02: 30 mg via INTRAVENOUS
  Administered 2019-10-02 (×2): 50 mg via INTRAVENOUS
  Administered 2019-10-02: 20 mg via INTRAVENOUS

## 2019-10-02 MED ORDER — EPINEPHRINE HCL 5 MG/250ML IV SOLN IN NS
0.5000 ug/min | INTRAVENOUS | Status: AC
  Administered 2019-10-02: 5 ug/min via INTRAVENOUS
  Filled 2019-10-02: qty 250

## 2019-10-02 MED ORDER — FENTANYL CITRATE (PF) 250 MCG/5ML IJ SOLN
INTRAMUSCULAR | Status: AC
  Filled 2019-10-02: qty 5

## 2019-10-02 MED ORDER — MIDAZOLAM HCL 2 MG/2ML IJ SOLN
INTRAMUSCULAR | Status: DC | PRN
  Administered 2019-10-02 (×2): 2 mg via INTRAVENOUS

## 2019-10-02 MED ORDER — CHLORHEXIDINE GLUCONATE 0.12% ORAL RINSE (MEDLINE KIT)
15.0000 mL | Freq: Two times a day (BID) | OROMUCOSAL | Status: DC
  Administered 2019-10-02 – 2019-10-03 (×2): 15 mL via OROMUCOSAL

## 2019-10-02 MED ORDER — FENTANYL CITRATE (PF) 100 MCG/2ML IJ SOLN: 50.0000 ug | INTRAMUSCULAR | Status: DC | PRN

## 2019-10-02 MED ORDER — CALCIUM CHLORIDE 10 % IV SOLN
INTRAVENOUS | Status: DC | PRN
  Administered 2019-10-02 (×3): 400 mg via INTRAVENOUS

## 2019-10-02 MED ORDER — ORAL CARE MOUTH RINSE: 15.0000 mL | OROMUCOSAL | Status: DC

## 2019-10-02 MED ORDER — ONDANSETRON HCL 4 MG/2ML IJ SOLN
INTRAMUSCULAR | Status: DC | PRN
  Administered 2019-10-02: 4 mg via INTRAVENOUS

## 2019-10-02 MED ORDER — SODIUM CHLORIDE (PF) 0.9 % IJ SOLN
INTRAVENOUS | Status: DC | PRN
  Administered 2019-10-02: 50 mL via INTRAMUSCULAR

## 2019-10-02 MED ORDER — 0.9 % SODIUM CHLORIDE (POUR BTL) OPTIME
TOPICAL | Status: DC | PRN
  Administered 2019-10-02: 1000 mL
  Administered 2019-10-02: 3000 mL

## 2019-10-02 MED ORDER — FENTANYL CITRATE (PF) 250 MCG/5ML IJ SOLN
INTRAMUSCULAR | Status: DC | PRN
  Administered 2019-10-02: 200 ug via INTRAVENOUS
  Administered 2019-10-02 (×3): 100 ug via INTRAVENOUS

## 2019-10-02 MED ORDER — EPHEDRINE SULFATE-NACL 50-0.9 MG/10ML-% IV SOSY
PREFILLED_SYRINGE | INTRAVENOUS | Status: DC | PRN
  Administered 2019-10-02: 6 mg via INTRAVENOUS

## 2019-10-02 MED ORDER — PROPOFOL 1000 MG/100ML IV EMUL
5.0000 ug/kg/min | INTRAVENOUS | Status: DC
  Administered 2019-10-02: 50 ug/kg/min via INTRAVENOUS
  Administered 2019-10-02: 60 ug/kg/min via INTRAVENOUS
  Administered 2019-10-03: 55 ug/kg/min via INTRAVENOUS
  Administered 2019-10-03: 50 ug/kg/min via INTRAVENOUS
  Administered 2019-10-03: 30 ug/kg/min via INTRAVENOUS
  Filled 2019-10-02 (×5): qty 100

## 2019-10-02 MED ORDER — PROTAMINE SULFATE 10 MG/ML IV SOLN
INTRAVENOUS | Status: DC | PRN
  Administered 2019-10-02: 50 mg via INTRAVENOUS

## 2019-10-02 MED ORDER — PROPOFOL 500 MG/50ML IV EMUL
INTRAVENOUS | Status: DC | PRN
  Administered 2019-10-02: 50 ug/kg/min via INTRAVENOUS

## 2019-10-02 MED ORDER — MIDAZOLAM HCL 2 MG/2ML IJ SOLN
INTRAMUSCULAR | Status: AC
  Filled 2019-10-02: qty 2

## 2019-10-02 MED ORDER — SCOPOLAMINE 1 MG/3DAYS TD PT72
MEDICATED_PATCH | TRANSDERMAL | Status: DC | PRN
  Administered 2019-10-02: 1 via TRANSDERMAL

## 2019-10-02 MED ORDER — HEPARIN SODIUM (PORCINE) 1000 UNIT/ML IJ SOLN
INTRAMUSCULAR | Status: DC | PRN
  Administered 2019-10-02: 5000 [IU] via INTRAVENOUS

## 2019-10-02 MED ORDER — ALBUMIN HUMAN 5 % IV SOLN: INTRAVENOUS | Status: DC | PRN

## 2019-10-02 MED ORDER — DEXAMETHASONE SODIUM PHOSPHATE 10 MG/ML IJ SOLN
INTRAMUSCULAR | Status: DC | PRN
  Administered 2019-10-02: 5 mg via INTRAVENOUS

## 2019-10-02 MED ORDER — PHENYLEPHRINE 40 MCG/ML (10ML) SYRINGE FOR IV PUSH (FOR BLOOD PRESSURE SUPPORT)
PREFILLED_SYRINGE | INTRAVENOUS | Status: DC | PRN
  Administered 2019-10-02 (×2): 120 ug via INTRAVENOUS
  Administered 2019-10-02: 200 ug via INTRAVENOUS
  Administered 2019-10-02: 120 ug via INTRAVENOUS

## 2019-10-02 MED ORDER — SODIUM CHLORIDE 0.9 % IV SOLN
INTRAVENOUS | Status: DC | PRN
  Administered 2019-10-02: 500 mL

## 2019-10-02 MED ORDER — EPINEPHRINE 1 MG/10ML IJ SOSY
PREFILLED_SYRINGE | INTRAMUSCULAR | Status: DC | PRN
  Administered 2019-10-02: .05 mg via INTRAVENOUS
  Administered 2019-10-02 (×2): .1 mg via INTRAVENOUS
  Administered 2019-10-02: .06 mg via INTRAVENOUS
  Administered 2019-10-02 (×2): .05 mg via INTRAVENOUS

## 2019-10-02 MED ORDER — NOREPINEPHRINE 4 MG/250ML-% IV SOLN
INTRAVENOUS | Status: DC | PRN
  Administered 2019-10-02: 10 ug/min via INTRAVENOUS

## 2019-10-02 MED ORDER — SUCCINYLCHOLINE CHLORIDE 200 MG/10ML IV SOSY
PREFILLED_SYRINGE | INTRAVENOUS | Status: DC | PRN
  Administered 2019-10-02: 100 mg via INTRAVENOUS

## 2019-10-02 MED ORDER — PROPOFOL 10 MG/ML IV BOLUS
INTRAVENOUS | Status: AC
  Filled 2019-10-02: qty 40

## 2019-10-02 MED ORDER — LACTATED RINGERS IV SOLN: INTRAVENOUS | Status: DC | PRN

## 2019-10-02 MED ORDER — FENTANYL CITRATE (PF) 100 MCG/2ML IJ SOLN
50.0000 ug | INTRAMUSCULAR | Status: DC | PRN
  Administered 2019-10-02 – 2019-10-03 (×3): 100 ug via INTRAVENOUS
  Filled 2019-10-02 (×3): qty 2

## 2019-10-02 MED ORDER — VASOPRESSIN 20 UNIT/ML IV SOLN
INTRAVENOUS | Status: AC
  Filled 2019-10-02: qty 1

## 2019-10-02 MED ORDER — VASOPRESSIN 20 UNIT/ML IV SOLN
INTRAVENOUS | Status: DC | PRN
  Administered 2019-10-02 (×2): 5 m[IU] via INTRAVENOUS
  Administered 2019-10-02 (×2): 2 m[IU] via INTRAVENOUS

## 2019-10-02 MED ORDER — PANTOPRAZOLE SODIUM 40 MG IV SOLR
40.0000 mg | Freq: Every day | INTRAVENOUS | Status: DC
  Administered 2019-10-02 – 2019-10-04 (×3): 40 mg via INTRAVENOUS
  Filled 2019-10-02 (×3): qty 40

## 2019-10-02 MED ORDER — CHLORHEXIDINE GLUCONATE 0.12% ORAL RINSE (MEDLINE KIT): 15.0000 mL | Freq: Two times a day (BID) | OROMUCOSAL | Status: DC

## 2019-10-02 SURGICAL SUPPLY — 94 items
BAG BANDED W/RUBBER/TAPE 36X54 (MISCELLANEOUS) ×4 IMPLANT
BAG SNAP BAND KOVER 36X36 (MISCELLANEOUS) ×4 IMPLANT
BIOPATCH RED 1 DISK 7.0 (GAUZE/BANDAGES/DRESSINGS) ×1 IMPLANT
BIOPATCH RED 1IN DISK 7.0MM (GAUZE/BANDAGES/DRESSINGS) ×1
BLADE STERNUM SYSTEM 6 (BLADE) ×2 IMPLANT
BLADE SURG 10 STRL SS (BLADE) ×2 IMPLANT
BLADE SURG 11 STRL SS (BLADE) ×2 IMPLANT
CANISTER SUCT 3000ML PPV (MISCELLANEOUS) ×4 IMPLANT
CATH ANGIO 5F BER2 65CM (CATHETERS) ×2 IMPLANT
CATH BEACON 5 .035 65 KMP TIP (CATHETERS) ×2 IMPLANT
CATH OMNI FLUSH 5F 65CM (CATHETERS) ×2 IMPLANT
CATH RETRIEVER CLOT 16MMX105CM (CATHETERS) ×2 IMPLANT
CATH THORACIC 36FR (CATHETERS) ×2 IMPLANT
CATH VISIONS PV .035 IVUS (CATHETERS) ×2 IMPLANT
CHLORAPREP W/TINT 26 (MISCELLANEOUS) ×8 IMPLANT
CLIP VESOCCLUDE MED 24/CT (CLIP) ×2 IMPLANT
CLIP VESOCCLUDE SM WIDE 24/CT (CLIP) ×2 IMPLANT
COVER BACK TABLE 80X110 HD (DRAPES) ×6 IMPLANT
COVER DOME SNAP 22 D (MISCELLANEOUS) ×4 IMPLANT
COVER MAYO STAND STRL (DRAPES) ×4 IMPLANT
COVER PROBE W GEL 5X96 (DRAPES) ×4 IMPLANT
COVER SURGICAL LIGHT HANDLE (MISCELLANEOUS) ×4 IMPLANT
COVER WAND RF STERILE (DRAPES) ×4 IMPLANT
DERMABOND ADVANCED (GAUZE/BANDAGES/DRESSINGS) ×2
DERMABOND ADVANCED .7 DNX12 (GAUZE/BANDAGES/DRESSINGS) ×2 IMPLANT
DEVICE TORQUE H2O (MISCELLANEOUS) IMPLANT
DRAPE FEMORAL ANGIO 80X135IN (DRAPES) ×4 IMPLANT
DRAPE WARM FLUID 44X44 (DRAPES) ×2 IMPLANT
DRSG SORBAVIEW 3.5X5-5/16 MED (GAUZE/BANDAGES/DRESSINGS) ×2 IMPLANT
ELECT CAUTERY BLADE 6.4 (BLADE) ×2 IMPLANT
ELECT REM PT RETURN 9FT ADLT (ELECTROSURGICAL)
ELECTRODE REM PT RTRN 9FT ADLT (ELECTROSURGICAL) IMPLANT
FILTER CO2 0.2 MICRON (VASCULAR PRODUCTS) IMPLANT
FILTER CO2 INSUFFLATOR AX1008 (MISCELLANEOUS) IMPLANT
GAUZE 4X4 16PLY RFD (DISPOSABLE) ×4 IMPLANT
GAUZE SPONGE 4X4 12PLY STRL (GAUZE/BANDAGES/DRESSINGS) ×2 IMPLANT
GLIDEWIRE ADV .035X260CM (WIRE) ×4 IMPLANT
GLOVE BIO SURGEON STRL SZ 6.5 (GLOVE) ×1 IMPLANT
GLOVE BIO SURGEONS STRL SZ 6.5 (GLOVE) ×1
GLOVE BIOGEL PI IND STRL 7.5 (GLOVE) ×2 IMPLANT
GLOVE BIOGEL PI IND STRL 8 (GLOVE) IMPLANT
GLOVE BIOGEL PI INDICATOR 7.5 (GLOVE) ×2
GLOVE BIOGEL PI INDICATOR 8 (GLOVE) ×2
GLOVE INDICATOR 7.5 STRL GRN (GLOVE) ×6 IMPLANT
GLOVE SURG SS PI 7.0 STRL IVOR (GLOVE) ×2 IMPLANT
GLOVE SURG SS PI 7.5 STRL IVOR (GLOVE) ×4 IMPLANT
GOWN STRL REUS W/ TWL LRG LVL3 (GOWN DISPOSABLE) ×4 IMPLANT
GOWN STRL REUS W/ TWL XL LVL3 (GOWN DISPOSABLE) ×2 IMPLANT
GOWN STRL REUS W/TWL LRG LVL3 (GOWN DISPOSABLE) ×4
GOWN STRL REUS W/TWL XL LVL3 (GOWN DISPOSABLE) ×2
GUIDEWIRE ANGLED .035X150CM (WIRE) IMPLANT
KIT BASIN OR (CUSTOM PROCEDURE TRAY) ×4 IMPLANT
KIT CATH LUMEN 2 9FR 11.5CM ST (CATHETERS) ×2 IMPLANT
KIT SUCTION CATH 14FR (SUCTIONS) ×2 IMPLANT
KIT TURNOVER KIT B (KITS) ×4 IMPLANT
NDL HYPO 25GX1X1/2 BEV (NEEDLE) IMPLANT
NDL PERC 18GX7CM (NEEDLE) ×2 IMPLANT
NEEDLE HYPO 25GX1X1/2 BEV (NEEDLE) IMPLANT
NEEDLE PERC 18GX7CM (NEEDLE) ×4 IMPLANT
NS IRRIG 1000ML POUR BTL (IV SOLUTION) ×10 IMPLANT
PACK CHEST (CUSTOM PROCEDURE TRAY) ×2 IMPLANT
PACK ENDO MINOR (CUSTOM PROCEDURE TRAY) ×4 IMPLANT
PAD ARMBOARD 7.5X6 YLW CONV (MISCELLANEOUS) ×8 IMPLANT
PENCIL BUTTON HOLSTER BLD 10FT (ELECTRODE) ×2 IMPLANT
PROTECTION STATION PRESSURIZED (MISCELLANEOUS)
SET FLUSH CO2 (MISCELLANEOUS) IMPLANT
SET MICROPUNCTURE 5F STIFF (MISCELLANEOUS) ×4 IMPLANT
SHEATH CLOT RETRIEVER (SHEATH) ×2 IMPLANT
SHEATH PINNACLE 5F 10CM (SHEATH) ×4 IMPLANT
SHEATH PINNACLE 8F 10CM (SHEATH) ×2 IMPLANT
SLEEVE ISOL F/PACE RF HD COVER (MISCELLANEOUS) ×2 IMPLANT
SPONGE LAP 18X18 RF (DISPOSABLE) ×2 IMPLANT
STATION PROTECTION PRESSURIZED (MISCELLANEOUS) ×2 IMPLANT
STOCKINETTE IMPERVIOUS 9X36 MD (GAUZE/BANDAGES/DRESSINGS) ×2 IMPLANT
SUT SILK  1 MH (SUTURE) ×2
SUT SILK 1 MH (SUTURE) IMPLANT
SUT VIC AB 1 CTX 18 (SUTURE) ×2 IMPLANT
SUT VIC AB 1 CTX 27 (SUTURE) ×4 IMPLANT
SUT VIC AB 3-0 SH 27 (SUTURE) ×8
SUT VIC AB 3-0 SH 27X BRD (SUTURE) IMPLANT
SYR 10ML LL (SYRINGE) ×16 IMPLANT
SYR 20ML LL LF (SYRINGE) ×8 IMPLANT
SYR 30ML LL (SYRINGE) ×4 IMPLANT
SYR CONTROL 10ML LL (SYRINGE) IMPLANT
SYR MEDRAD MARK V 150ML (SYRINGE) IMPLANT
TAPE CLOTH SURG 4X10 WHT LF (GAUZE/BANDAGES/DRESSINGS) ×2 IMPLANT
TOWEL GREEN STERILE (TOWEL DISPOSABLE) ×4 IMPLANT
TOWEL GREEN STERILE FF (TOWEL DISPOSABLE) IMPLANT
TUBE CONNECTING 20'X1/4 (TUBING) ×1
TUBE CONNECTING 20X1/4 (TUBING) ×1 IMPLANT
TUBING HIGH PRESSURE 120CM (CONNECTOR) IMPLANT
WATER STERILE IRR 1000ML POUR (IV SOLUTION) IMPLANT
WIRE AMPLATZ SS-J .035X180CM (WIRE) ×2 IMPLANT
WIRE BENTSON .035X145CM (WIRE) ×6 IMPLANT

## 2019-10-02 NOTE — OR Nursing (Signed)
[id number] Dr. Trula Slade called stating X-Ray was fine.

## 2019-10-02 NOTE — Progress Notes (Signed)
PROGRESS NOTE  Veronica [name] [id number] DOB: Jan 04, 1971 DOA: 09/28/2019 PCP: Blair Heys, PA-C   LOS: 4 days   Brief narrative: As per HPI: Veronica [name] a 48 y.o.femalewith medical history significant for breast cancer currently on chemotherapy presented to hospital with complaints of increasing facial and neck swelling associated with redness. She also reported of bilateral upper extremity swelling especially right upper extremity. Based on her worsening symptoms, she was seen by her provider who ordered CTAof the chest. It showed bilateral pulmonary embolism and occlusion of SVC, she was referred to ED for further management.   ED Course:She was hemodynamically stable at the ED and afebrile. She was saturating 100% on room air.Patient was started on heparin drip. Case was discussed case with vascular surgeon Dr. Trula Slade. Admitted to hospitalist services for further evaluation and management Incidentally, she was positive for COVID-19 PCR.  Patient was initially admitted at [facility] long and was transferred to [facility] for further care.  Assessment/Plan:  Principal Problem:   Multiple subsegmental pulmonary emboli without acute cor pulmonale (HCC) Active Problems:   Malignant neoplasm of upper-outer quadrant of right breast in female, estrogen receptor positive (HCC)   Thrombocytopenia (HCC)   Superior vena cava thrombosis (HCC)   SVCO (superior vena cava obstruction)   SVC syndrome  Acute SVC syndrome/Acute pulmonary embolism Likely secondary to malignancy.  Currently on heparin drip.  Vascular surgery planning for mechanical thrombectomy by 10/02/2019.  Denies any chest pain or dyspnea at this time.  COVID-19 infection Currently asymptomatic without hypoxia. Will avoid dexamethasone for now.  Patient has been started on remdesivir.  Closely monitor inflammatory markers.  Significantly elevated D-dimer but patient is already on heparin drip.  COVID-19  Labs  Recent Labs    09/30/19 2048 10/02/19 0730  DDIMER  --  1.29*  FERRITIN 663* 786*  LDH 170  --   CRP 2.0* 2.6*    Lab Results  Component Value Date   SARSCOV2NAA POSITIVE (A) 09/28/2019   [place] NEGATIVE 08/28/2019   SARSCOV2NAA NOT DETECTED 07/13/2019   SARSCOV2NAA NEGATIVE 06/08/2019    Thrombocytopenia We will closely monitor on heparin drip.    Spoke with Dr. Jana Hakim oncology yesterday and recommended a platelet transfusion for procedure.  Platelet of 50,000 today from 33,000.  Leukocytosis to leukopenia Afebrile with no obvious sign of infection or fever but has been empirically started on ciprofloxacin as per oncology recommendation.  Severe leukopenia and neutropenia. Patient had received the Neulasta with her recent oncological treatment so no further recommendations on that.   Hypokalemia Improved with replacement.  Potassium 3.6 today.  Hypertension Blood pressure stable so far.  Will closely monitor.  Breast cancer on chemotherapy Oncology on board.  VTE Prophylaxis: Heparin drip  Code Status: Full code  Family Communication: Spoke with the patient at bedside  Disposition Plan: Home likely in 1 to 2 days.  Awaiting for vascular surgery intervention today  Consultants:  Oncology,   vascular surgery  Procedures:  None so far  Antibiotics:  Prophylactic antibiotic with Cipro 12/21>  Anti-infectives (From admission, onward)   Start     Dose/Rate Route Frequency Ordered Stop   10/02/19 1000  remdesivir 100 mg in sodium chloride 0.9 % 100 mL IVPB     100 mg 200 mL/hr over 30 Minutes Intravenous Daily 10/01/19 1121 10/06/19 0959   10/01/19 1200  ciprofloxacin (CIPRO) IVPB 400 mg     400 mg 200 mL/hr over 60 Minutes Intravenous Every 12 hours 10/01/19 1113  10/01/19 1130  remdesivir 200 mg in sodium chloride 0.9% 250 mL IVPB     200 mg 580 mL/hr over 30 Minutes Intravenous Once 10/01/19 1121 10/01/19 1301      Subjective: Patient denies interval complaints.,  Denies dyspnea, cough fever or chills.  Has mild facial puffiness and swelling.  Objective: Vitals:   10/01/19 2152 10/02/19 0402  BP:    Pulse:    Resp:    Temp: 97.7 F (36.5 C) (!) 97.5 F (36.4 C)  SpO2:      Intake/Output Summary (Last 24 hours) at 10/02/2019 1007 Last data filed at 10/01/2019 1622 Gross per 24 hour  Intake 637.53 ml  Output --  Net 637.53 ml   Filed Weights   09/28/19 1659  Weight: 95.7 kg   Body mass index is 39.87 kg/m.   Physical Exam: General: Morbidly obese, not in obvious distress HENT: Normocephalic, pupils equally reacting to light and accommodation.  No scleral pallor or icterus noted.  Mild puffiness of the face with erythema and fullness of the right side of neck. Chest:  Clear breath sounds.  Diminished breath sounds bilaterally. No crackles or wheezes.  Port over the right chest wall. CVS: S1 &S2 heard. No murmur.  Regular rate and rhythm. Abdomen: Soft, nontender, nondistended.  Bowel sounds are heard.  Extremities: No cyanosis, clubbing or edema.  Peripheral pulses are palpable. Psych: Alert, awake and oriented, normal mood CNS:  No cranial nerve deficits.  Power equal in all extremities.  No sensory deficits noted.  No cerebellar signs.   Skin: Warm and dry.  No rashes noted.   Data Review: I have personally reviewed the following laboratory data and studies,  CBC: Recent Labs  Lab 09/28/19 1652 09/29/19 0500 09/30/19 0500 10/01/19 0705 10/01/19 1827 10/02/19 0730  WBC 28.3* 14.6* 2.8* 1.0* 0.9* 0.8*  NEUTROABS 27.1*  --   --   --   --  0.2*  HGB 11.1* 10.5* 9.4* 9.2* 9.9* 9.1*  HCT 34.7* 32.6* 29.6* 27.8* 31.7* 27.9*  MCV 94.0 94.2 93.4 91.1 97.2 92.1  PLT 69* 54* 40* 33* 45* 50*   Basic Metabolic Panel: Recent Labs  Lab 09/28/19 1652 09/29/19 0500 10/02/19 0730  NA 136 137 136  K 3.3* 3.7 3.6  CL 100 102 101  CO2 21* 22 20*  GLUCOSE 171* 148* 159*  BUN  24* 23* 8  CREATININE 0.79 0.78 0.72  CALCIUM 9.6 9.5 9.7  MG  --   --  1.8   Liver Function Tests: Recent Labs  Lab 09/29/19 0500 10/02/19 0730  AST 23 18  ALT 34 28  ALKPHOS 89 68  BILITOT 1.4* 0.9  PROT 6.1* 6.1*  ALBUMIN 3.7 3.7   No results for input(s): LIPASE, AMYLASE in the last 168 hours. No results for input(s): AMMONIA in the last 168 hours. Cardiac Enzymes: No results for input(s): CKTOTAL, CKMB, CKMBINDEX, TROPONINI in the last 168 hours. BNP (last 3 results) Recent Labs    09/01/19 1931 09/05/19 1030 09/28/19 1652  BNP 222.0* 60.3 335.7*    ProBNP (last 3 results) No results for input(s): PROBNP in the last 8760 hours.  CBG: No results for input(s): GLUCAP in the last 168 hours. Recent Results (from the past 240 hour(s))  SARS CORONAVIRUS 2 (TAT 6-24 HRS) Nasopharyngeal Nasopharyngeal Swab     Status: Abnormal   Collection Time: 09/28/19  4:52 PM   Specimen: Nasopharyngeal Swab  Result Value Ref Range Status   SARS Coronavirus 2  POSITIVE (A) NEGATIVE Final    Comment: RESULT CALLED TO, READ BACK BY AND VERIFIED WITH: H. COOK,RN [id number] 09/29/2019 T. TYSOR (NOTE) SARS-CoV-2 target nucleic acids are DETECTED. The SARS-CoV-2 RNA is generally detectable in upper and lower respiratory specimens during the acute phase of infection. Positive results are indicative of the presence of SARS-CoV-2 RNA. Clinical correlation with patient history and other diagnostic information is  necessary to determine patient infection status. Positive results do not rule out bacterial infection or co-infection with other viruses.  The expected result is Negative. Fact Sheet for Patients: SugarRoll.be Fact Sheet for Healthcare Providers: https://www.woods-mathews.com/ This test is not yet approved or cleared by the [place] FDA and  has been authorized for detection and/or diagnosis of SARS-CoV-2 by FDA under an Emergency Use  Authorization (EUA). This EUA will remain  in effect (meaning this test can be used) for the  duration of the COVID-19 declaration under Section 564(b)(1) of the Act, 21 U.S.C. section 360bbb-3(b)(1), unless the authorization is terminated or revoked sooner. Performed at [facility], [place] [address], [place], [place] [address]      Studies: ECHOCARDIOGRAM COMPLETE  Result Date: 09/30/2019   ECHOCARDIOGRAM REPORT   Patient Name:   JACQELINE STAYNER Date of Exam: 09/30/2019 Medical Rec #:  [id number]      Height:       61.0 in Accession #:    [id number]     Weight:       211.0 lb Date of Birth:  August 26, 1971      BSA:          1.93 m Patient Age:    79 years       BP:           164/83 mmHg Patient Gender: F              HR:           111 bpm. Exam Location:  Inpatient Procedure: 2D Echo, Color Doppler, Cardiac Doppler and Intracardiac            Opacification Agent Indications:    I26.02 Pulmonary embolus  History:        Patient has prior history of Echocardiogram examinations, most                 recent 09/19/2019. Risk Factors:Hypertension. SVC thrombus at                 time of exam.  Sonographer:    Raquel Sarna Senior RDCS Referring Phys: [id number] [place]  1. Left ventricular ejection fraction, by visual estimation, is 60 to 65%. The left ventricle has normal function. There is no left ventricular hypertrophy.  2. The left ventricle has no regional wall motion abnormalities.  3. Global right ventricle has normal systolic function.The right ventricular size is normal. No increase in right ventricular wall thickness.  4. Left atrial size was normal.  5. Right atrial size was normal.  6. Small pericardial effusion.  7. The mitral valve is grossly normal. No evidence of mitral valve regurgitation.  8. The tricuspid valve is grossly normal. Tricuspid valve regurgitation is trivial.  9. The aortic valve is grossly normal. Aortic valve regurgitation is not visualized. No evidence of aortic  valve sclerosis or stenosis. 10. The pulmonic valve was grossly normal. Pulmonic valve regurgitation is not visualized. 11. The inferior vena cava is normal in size with <50% respiratory variability, suggesting right atrial pressure of 8 mmHg.  12. The atrial septum is grossly normal. FINDINGS  Left Ventricle: Left ventricular ejection fraction, by visual estimation, is 60 to 65%. The left ventricle has normal function. The left ventricle has no regional wall motion abnormalities. There is no left ventricular hypertrophy. Left ventricular diastolic parameters were normal. Right Ventricle: The right ventricular size is normal. No increase in right ventricular wall thickness. Global RV systolic function is has normal systolic function. Left Atrium: Left atrial size was normal in size. Right Atrium: Right atrial size was normal in size Pericardium: A small pericardial effusion is present. Mitral Valve: The mitral valve is grossly normal. No evidence of mitral valve regurgitation. Tricuspid Valve: The tricuspid valve is grossly normal. Tricuspid valve regurgitation is trivial. Aortic Valve: The aortic valve is grossly normal. Aortic valve regurgitation is not visualized. The aortic valve is structurally normal, with no evidence of sclerosis or stenosis. Pulmonic Valve: The pulmonic valve was grossly normal. Pulmonic valve regurgitation is not visualized. Pulmonic regurgitation is not visualized. Aorta: The aortic root, ascending aorta and aortic arch are all structurally normal, with no evidence of dilitation or obstruction. Venous: The inferior vena cava is normal in size with less than 50% respiratory variability, suggesting right atrial pressure of 8 mmHg. IAS/Shunts: The atrial septum is grossly normal.  LEFT VENTRICLE PLAX 2D LVIDd:         4.00 cm  Diastology LVIDs:         2.80 cm  LV e' lateral:   7.29 cm/s LV PW:         0.80 cm  LV E/e' lateral: 7.9 LV IVS:        0.80 cm  LV e' medial:    6.42 cm/s LVOT diam:      1.90 cm  LV E/e' medial:  9.0 LV SV:         40 ml LV SV Index:   19.43 LVOT Area:     2.84 cm  RIGHT VENTRICLE RV S prime:     19.10 cm/s TAPSE (M-mode): 2.3 cm LEFT ATRIUM             Index       RIGHT ATRIUM           Index LA diam:        3.00 cm 1.55 cm/m  RA Area:     14.30 cm LA Vol (A2C):   25.3 ml 13.09 ml/m RA Volume:   30.30 ml  15.68 ml/m LA Vol (A4C):   35.9 ml 18.58 ml/m LA Biplane Vol: 31.6 ml 16.35 ml/m  AORTIC VALVE LVOT Vmax:   103.00 cm/s LVOT Vmean:  61.300 cm/s LVOT VTI:    0.160 m  AORTA Ao Root diam: 2.30 cm MITRAL VALVE MV Area (PHT): 3.99 cm             SHUNTS MV PHT:        55.10 msec           Systemic VTI:  0.16 m MV Decel Time: 190 msec             Systemic Diam: 1.90 cm MV E velocity: 57.80 cm/s 103 cm/s MV A velocity: 73.70 cm/s 70.3 cm/s MV E/A ratio:  0.78       1.5  Buford Dresser MD Electronically signed by Buford Dresser MD Signature Date/Time: 09/30/2019/3:42:47 PM    Final     Scheduled Meds:  Chlorhexidine Gluconate Cloth  6 each Topical Daily   famotidine  10  mg Oral BID   feeding supplement (ENSURE ENLIVE)  237 mL Oral TID BM    Continuous Infusions:  ciprofloxacin 400 mg (10/01/19 2337)   heparin 1,650 Units/hr (10/02/19 0509)   remdesivir 100 mg in NS 100 mL       Flora Lipps, MD  [facility] 10/02/2019

## 2019-10-02 NOTE — Interval H&P Note (Signed)
History and Physical Interval Note:  10/02/2019 1:49 PM  Veronica [name]  has presented today for surgery, with the diagnosis of ESRD.  The various methods of treatment have been discussed with the patient and family. After consideration of risks, benefits and other options for treatment, the patient has consented to  Procedure(s): PERCUTANEOUS  SVC THROMBECTOMY USING INARI (N/A) AORTOGRAM (N/A) Intravascular Ultrasound (N/A) as a surgical intervention.  The patient's history has been reviewed, patient examined, no change in status, stable for surgery.  I have reviewed the patient's chart and labs.  Questions were answered to the patient's satisfaction.     Annamarie Major

## 2019-10-02 NOTE — Progress Notes (Signed)
eLink Physician-Brief Progress Note Patient Name: Veronica [name] DOB: 1970/11/22 MRN: [id number]   Date of Service  10/02/2019  HPI/Events of Note  Patient is on Pepcid PO and Protonix IV for stress ulcer prophylaxis.   eICU Interventions  Will stop Pepcid PO.      Intervention Category Major Interventions: Other:  Lysle Dingwall 10/02/2019, 10:03 PM

## 2019-10-02 NOTE — Progress Notes (Signed)
Pt transported with RT x1 and OR staff x3 from OR 16 to 22M 11 on ventilator. VSS throughtout. Pt tolerated well.  7.0 ETT remains secure at 24cm at the lip. RT will continue to monitor.

## 2019-10-02 NOTE — Op Note (Signed)
  CARDIOVASCULAR SURGERY OPERATIVE NOTE  10/02/2019  Surgeon:  Gaye Pollack, MD  First Assistant: Annamarie Major, MD   Preoperative Diagnosis:  Large hemopericardium with tamponade  Postoperative Diagnosis:  Same  Procedure:  Subxyphoid pericardial window for drainage of hemopericardium.   Anesthesia:  General Endotracheal   Clinical History/Surgical Indication:  The patient is a 48 year old woman with metastatic breast cancer who presented with SVC thrombus with occlusion and was also found to be Covid-19 +. She was undergoing percutaneous SVC thrombectomy by Dr. Trula Slade and then developed progressive hypotension and tachycardic. TEE showed development of an enlarging pericardial effusion. It was felt that emergent pericardial window was needed to drain the effusion since her systolic BP was [id number] on escalating vasopressors. She was given protamine to reverse the heparin she was on and ACT normalized.   TEE:  Performed by Dr. Smith Robert. This showed a large circumferential pericardial effusion.  Subxyphoid pericardial window:  A 6 cm incision was made over the xyphoid process and carried down through the subcutaneous tissue using cautery until the midline fascia was encountered. The fascia was divided in the midline and the subxyphoid space entered. The anterior pericardium was visualized just at the diaphragm and it was opened with cautery. The pericardial space was entered and about 900 cc of dark venous blood was removed. There was immediate return of her BP to normal and decreased in her tachycardia. TEE confirmed that all of the fluid was removed. A 36 F right angle chest tube was placed in the inferior pericardial space through a separate small incision. Hemostasis was complete. We watched the tube for a while and there did not appear to be any active bleeding. Given her morbid obesity and comorbid risk factors I did not think a sternotomy was indicated since the bleeding appeared to  have stopped. The midline fascia was approximated with interrupted #0 vicryl sutures. The subcutaneous tissue was approximated with continuous 2-0 vicryl suture and the skin with 3-0 vicryl subcuticular suture. The sponge, needle, and instrument counts were correct according to the nurses.  The patient was transported to the ICU in stable condition.

## 2019-10-02 NOTE — Progress Notes (Signed)
ANTICOAGULATION CONSULT NOTE - Follow Up Consult  Pharmacy Consult for heparin Indication: pulmonary embolus  No Known Allergies  Patient Measurements: Height: 5\' 1"  (154.9 cm) Weight: 211 lb (95.7 kg) IBW/kg (Calculated) : 47.8 Heparin Dosing Weight: 71 kg  Vital Signs: Temp: 97.5 F (36.4 C) (12/22 0402) Temp Source: Oral (12/22 0402)  Labs: Recent Labs    09/30/19 2048 10/01/19 0705 10/01/19 1827 10/02/19 0730  HGB  --  9.2* 9.9* 9.1*  HCT  --  27.8* 31.7* 27.9*  PLT  --  33* 45* 50*  HEPARINUNFRC 0.43 0.45  --  0.27*  CREATININE  --   --   --  0.72    Estimated Creatinine Clearance: 92.9 mL/min (by C-G formula based on SCr of 0.72 mg/dL).   Assessment: Pharmacy consulted to dose/monitor heparin in this 48 year old female with PMH significant for breast cancer on chemotherapy PTA with curative intent (Cytoxan + doxorubicin as well as Neulasta given 12/14). CT angio chest on 12/18 revealed acute bilateral lower lobe PE, acute occlusive SVC thrombosis. V Not on anticoagulation PTA. Pt also COVID +.  Heparin level 0.27 this AM , pltc remains low likely 2/2 chemotherapy (receiving transfusion)  Goal of Therapy:  Heparin level 0.3-0.7 units/ml Monitor platelets by anticoagulation protocol: Yes   Plan:  -Heparin to 1750 units / hr  -Follow up post thrombectomy -Daily heparin level and CBC  Thank you Anette Guarneri, PharmD  Please check AMION for all [place] numbers 10/02/2019

## 2019-10-02 NOTE — Consult Note (Signed)
NAME:  Veronica [name], MRN:  [id number], DOB:  March 22, 1971, LOS: 4 ADMISSION DATE:  09/28/2019, CONSULTATION DATE:  12/22 REFERRING MD:  Dr. Louanne Belton , CHIEF COMPLAINT:  PE   Brief History   Veronica [name] is a 48 y.o. female who was admitted 12/18 with PE and SVC syndrome.  Taken to OR 12/22 for thrombectomy which was complicated by large hemopericardium and tamponade requiring subxyphoid pericardial window.  History of present illness   Pt is encephelopathic; therefore, this HPI is obtained from chart review. Veronica [name] is a 48 y.o. female who has a PMH including but not limited to breast CA s/p right lumpectomy and SLN sampling (T1cN1, stage IIA invasive ductal carcinoma, grade 3, ER/PR +, HER2 nonamplified) currently on chemo with plans for adjuvant radiation to follow and followed by Dr. Jana Hakim (see "past medical history" for rest).  She presented to [facility] ED 12/18 with facial and UE swelling for several days.  CTA demonstrated PE and occluded SVC.  COVID returned positive (husband had been positive prior).  She was started on heparin and was transferred to [facility].  She was evaluated by vascular surgery and on 12/22, was taken to OR for thrombectomy.  Unfortunately, this was complicated by large hemopericardium with tamponade.  TCTS was called emergently and she had subxyphoid pericardial window placed.  Post op, she was left intubated and was transferred to the MICU for further management.  Past Medical History  S/P laparoscopic assisted vaginal hysterectomy (LAVH) Malignant neoplasm of upper-outer quadrant of right breast in female, estrogen receptor positive ([place]); Family history of leukemia; Family history of stomach cancer; Genetic testing; Port-A-Cath in place; Sepsis ([place]); Neutropenia with fever ([place]); Hematochezia; Anemia; Thrombocytopenia ([place]); Cough; Angioedema; Multiple subsegmental pulmonary emboli without acute cor pulmonale ([place]); Superior vena cava thrombosis (HCC); SVCO  (superior vena cava obstruction); SVC syndrome; and COVID-19 virus infection on their problem list.  Significant Hospital Events   12/18 > admit. 12/22 > to OR for thrombectomy which was complicated by large hemopericardium with tamponade requiring subxyphoid pericardial window.  Consults:  Vascular, TCTS, PCCM.   Procedures:  ETT 12/22 >  L radial art line 12/22 >  Right femoral CVL >>  Significant Diagnostic Tests:  CTA chest 12/18 > bilateral lower lobe PE, SVC occlusion, probable RLL infarct, small b/l effusions. CT head 12/18 > no acute findings. Echo 12/20 > EF 60-65%, small pericardial effusion. Echo 12/22 (post thrombectomy) > significantly reduced clot burden in the SVC with improved flow on the venogram.  Large expanding circumferential pericardial effusion with evidence of tamponade. Echo 12/22 (post pericardial window) > resolution of pericardial effusion without evidence of tamponade, LVEF > 65%, small clot burden in SVC.  Micro Data:  SARS CoV2 12/18 > positive.  Antimicrobials:  None   Interim history/subjective:    Objective   Blood pressure (!) 117/58, pulse (!) 108, temperature 98 F (36.7 C), resp. rate (!) 21, height '5\' 1"'$  (1.549 m), weight 95.7 kg, SpO2 99 %.        Intake/Output Summary (Last 24 hours) at 10/02/2019 1733 Last data filed at 10/02/2019 1730 Gross per 24 hour  Intake 2380 ml  Output 1250 ml  Net 1130 ml   Filed Weights   09/28/19 1659  Weight: 95.7 kg    Examination: General: Obese, well-nourished woman orally intubated, sedated HENT: Mild pallor, no icterus, ETT at 24 cm at lip Lungs: Decreased breath sounds bilateral Cardiovascular: Mediastinal tube , S1-S2 distant, no rub Abdomen:  Soft protuberant, no guarding Extremities: Right femoral line, no deformity Neuro: Sedated paralyzed GU: Blood-tinged urine  Resolved Hospital Problem list   Acute SVC syndrome  Acute Pulmonary embolism -CTA chest 12/18 with bilateral lower  lobe PE and findings compatible with acutely occluded catheter associated SVC syndrome  S/p operative correction of occulted SVD 12/22  Plan:  Primary management per vascular   Pericardial tamponade post thrombectomy status post window Pericardial chest tube to drainage per CTS Monitor chest tube output  Further anticoagulation per Vascular/CTS  Obtain venous duplex for completion, if unable to anticoagulate for long may need IVC filter  Acute Respiratory Failure -In the setting of elective procedure to remove SVC thrombus, patient developed pericardial effusion post thrombectomy as above, remained intubated post procedure  Plan: Continue ventilator support with lung protective strategies  Wean PEEP and FiO2 for sats greater than 90%. Obtain chest x-ray/ABG Head of bed elevated 30 degrees. Plateau pressures less than 30 cm H20.  SAT/SBT as tolerated, mentation preclude extubation  Ensure adequate pulmonary hygiene  VAP bundle in place  PAD protocol  COVID infection  -Thus far has remained asymptomatic  Plan: Trend inflammatory markers  Airborne precautions  No steroid therapy since was not hypoxic Remdesivir started 12/21  Thrombocytopenia -Currently on chemo for breast cancer  - -received 1 unit prior to surgery 12/22 Plan: Trend Platelets Monitor for signs of bleeding Follow transfusion protocol Started on prophylactic Cipro per oncology   Leukopenia  -Currently receiving chemo for breast cancer as below  Plan: Trend WBC  No signs of acute infection currently   Hypokalemia  Plan: Trend  Supplement as needed   Hypertension Plan:   Breast cacer on chemotherapy  -per oncology's note she is 6 cycle 4 of chemo as of 12/19. She received Neulasta 12/14.  Plan: Management per Oncology  Monitor thrombocytopenia and leukopenia as above   Assessment & Plan:    Best practice:  Diet: NPO Pain/Anxiety/Delirium protocol (if indicated): PRN fentanyl and versed    VAP protocol (if indicated): In place  DVT prophylaxis: SCD defer pharmacologic agent to vascular and CTS GI prophylaxis: PPI Glucose control: SSI Mobility: Bedrest Code Status: Full Family Communication: Will update  Disposition: ICU   Labs   CBC: Recent Labs  Lab 09/28/19 1652 09/29/19 0500 09/30/19 0500 10/01/19 0705 10/01/19 1827 10/02/19 0730  WBC 28.3* 14.6* 2.8* 1.0* 0.9* 0.8*  NEUTROABS 27.1*  --   --   --   --  0.2*  HGB 11.1* 10.5* 9.4* 9.2* 9.9* 9.1*  HCT 34.7* 32.6* 29.6* 27.8* 31.7* 27.9*  MCV 94.0 94.2 93.4 91.1 97.2 92.1  PLT 69* 54* 40* 33* 45* 50*    Basic Metabolic Panel: Recent Labs  Lab 09/28/19 1652 09/29/19 0500 10/02/19 0730  NA 136 137 136  K 3.3* 3.7 3.6  CL 100 102 101  CO2 21* 22 20*  GLUCOSE 171* 148* 159*  BUN 24* 23* 8  CREATININE 0.79 0.78 0.72  CALCIUM 9.6 9.5 9.7  MG  --   --  1.8   GFR: Estimated Creatinine Clearance: 92.9 mL/min (by C-G formula based on SCr of 0.72 mg/dL). Recent Labs  Lab 09/30/19 0500 10/01/19 0705 10/01/19 1827 10/02/19 0730  WBC 2.8* 1.0* 0.9* 0.8*    Liver Function Tests: Recent Labs  Lab 09/29/19 0500 10/02/19 0730  AST 23 18  ALT 34 28  ALKPHOS 89 68  BILITOT 1.4* 0.9  PROT 6.1* 6.1*  ALBUMIN 3.7 3.7   No results  for input(s): LIPASE, AMYLASE in the last 168 hours. No results for input(s): AMMONIA in the last 168 hours.  ABG No results found for: PHART, PCO2ART, PO2ART, HCO3, TCO2, ACIDBASEDEF, O2SAT   Coagulation Profile: Recent Labs  Lab 09/29/19 0500  INR 1.0    Cardiac Enzymes: No results for input(s): CKTOTAL, CKMB, CKMBINDEX, TROPONINI in the last 168 hours.  HbA1C: No results found for: HGBA1C  CBG: No results for input(s): GLUCAP in the last 168 hours.  Review of Systems:   Unable to obtain secondary to unresponsiveness   Past Medical History  She,  has a past medical history of Cancer ([place]), Chronic low back pain with left-sided sciatica, Family history of  leukemia, Family history of stomach cancer, and Hypertension.   Surgical History    Past Surgical History:  Procedure Laterality Date  . BREAST BIOPSY Right 05/02/2019   right clips X2  . BREAST LUMPECTOMY WITH RADIOACTIVE SEED AND SENTINEL LYMPH NODE BIOPSY Right 06/12/2019   Procedure: RIGHT BREAST RADIOACTIVE SEED LUMPECTOMY  AND RIGHT AXILLARY SEED TARGETED LYMPH NODE AND AXILLARY SENTINEL LYMPH NODE MAPPING;  Surgeon: Erroll Luna, MD;  Location: [facility];  Service: General;  Laterality: Right;  PEC BLOCK  . C/S x2  '98 '03  . CESAREAN SECTION    . CHOLECYSTECTOMY    . LAPAROSCOPIC ASSISTED VAGINAL HYSTERECTOMY  05/17/2011   Procedure: LAPAROSCOPIC ASSISTED VAGINAL HYSTERECTOMY;  Surgeon: Sharene Butters;  Location: [facility];  Service: Gynecology;  Laterality: N/A;  Laparoscopic Assisted Vaginal Hysterectomy With Lysis Of Adhesions  . PORTACATH PLACEMENT Right 06/12/2019   Procedure: INSERTION PORT-A-CATH WITH ULTRASOUND;  Surgeon: Erroll Luna, MD;  Location: [place];  Service: General;  Laterality: Right;  . RE-EXCISION OF BREAST LUMPECTOMY Right 07/17/2019   Procedure: RE-EXCISION OF RIGHT BREAST LUMPECTOMY;  Surgeon: Erroll Luna, MD;  Location: [facility];  Service: General;  Laterality: Right;  . TUBAL LIGATION       Social History   reports that she has never smoked. She has never used smokeless tobacco. She reports current alcohol use of about 2.0 standard drinks of alcohol per week. She reports that she does not use drugs.   Family History   Her family history includes Arthritis in her mother; COPD in her mother; Hyperlipidemia in her mother; Hypertension in her mother; Leukemia (age of onset: 40) in her brother; Stomach cancer in her maternal grandmother.   Allergies No Known Allergies   Home Medications  Prior to Admission medications   Medication Sig Start Date End Date Taking? Authorizing Provider  acetaminophen  (TYLENOL) 500 MG tablet Take 500 mg by mouth every 6 (six) hours as needed for mild pain.    Yes [provider]  Cholecalciferol (VITAMIN D3) 25 MCG (1000 UT) CHEW Chew 1 tablet by mouth daily.    Yes [provider]  dexamethasone (DECADRON) 4 MG tablet TAKE TWO TABLETS BY MOUTH ONCE A DAY ON THE DAY AFTER CHEMOTHERAPY AND THEN TAKE TWO TABLETS BY MOUTH TWICE DAILY FOR 2 DAYS Patient taking differently: Take 8 mg by mouth See admin instructions. TAKE 59m BY MOUTH ONCE A DAY ON THE DAY AFTER CHEMOTHERAPY AND THEN TAKE 85m BY MOUTH TWICE DAILY FOR 2 DAYS 08/22/19  Yes Magrinat, GuVirgie DadMD  famotidine (PEPCID) 10 MG tablet Take 10 mg by mouth 2 (two) times daily.   Yes [provider]  furosemide (LASIX) 20 MG tablet Take 1 tablet (20 mg total)  by mouth daily for 3 days. Patient taking differently: Take 20 mg by mouth daily. Only when instructed by MD 09/01/19 09/28/19 Yes Sherwood Gambler, MD  lidocaine-prilocaine (EMLA) cream Apply to affected area once 06/21/19  Yes Magrinat, Virgie Dad, MD  loratadine (CLARITIN) 10 MG tablet Take 10 mg by mouth See admin instructions. Takes for 5 days after chemo   Yes [provider]  LORazepam (ATIVAN) 0.5 MG tablet Take 1 tablet (0.5 mg total) by mouth at bedtime as needed (Nausea or vomiting). 06/21/19  Yes Magrinat, Virgie Dad, MD  olmesartan-hydrochlorothiazide (BENICAR HCT) 20-12.5 MG tablet Take 1 tablet by mouth daily.   Yes [provider]  ondansetron (ZOFRAN) 8 MG tablet Take 1 tablet (8 mg total) by mouth every 8 (eight) hours as needed for nausea or vomiting. 08/21/19  Yes Magrinat, Virgie Dad, MD  potassium chloride SA (KLOR-CON) 20 MEQ tablet Take 2 tablets (40 mEq total) by mouth 2 (two) times daily for 3 days. Patient taking differently: Take 40 mEq by mouth daily. Only when instructed by MD 09/01/19 09/28/19 Yes Sherwood Gambler, MD  prochlorperazine (COMPAZINE) 10 MG tablet Take 1 tablet (10 mg total) by  mouth every 6 (six) hours as needed (Nausea or vomiting). 06/21/19  Yes Magrinat, Virgie Dad, MD     Critical care time:   CRITICAL CARE Performed by: Leanna Sato Elsworth Soho MD  Total critical care time: 50 minutes  Critical care time was exclusive of separately billable procedures and treating other patients.  Critical care was necessary to treat or prevent imminent or life-threatening deterioration.  Critical care was time spent personally by me on the following activities: development of treatment plan with patient and/or surrogate as well as nursing, discussions with consultants, evaluation of patient's response to treatment, examination of patient, obtaining history from patient or surrogate, ordering and performing treatments and interventions, ordering and review of laboratory studies, ordering and review of radiographic studies, pulse oximetry and re-evaluation of patient's condition.  Kara Mead MD. [facility] Flood. [facility]  If no response to pager , please call [phone]    10/02/2019, 6:14 PM

## 2019-10-02 NOTE — Anesthesia Preprocedure Evaluation (Addendum)
Anesthesia Evaluation  Patient identified by MRN, date of birth, ID band Patient awake    Reviewed: Allergy & Precautions, NPO status , Patient's Chart, lab work & pertinent test results  Airway Mallampati: III  TM Distance: >3 FB Neck ROM: Full    Dental  (+) Teeth Intact, Dental Advisory Given   Pulmonary neg pulmonary ROS,    breath sounds clear to auscultation       Cardiovascular hypertension, Pt. on medications  Rhythm:Regular Rate:Normal     Neuro/Psych negative psych ROS   GI/Hepatic Neg liver ROS, GERD  Medicated,  Endo/Other  negative endocrine ROS  Renal/GU negative Renal ROS     Musculoskeletal negative musculoskeletal ROS (+)   Abdominal (+) + obese,   Peds  Hematology negative hematology ROS (+)   Anesthesia Other Findings   Reproductive/Obstetrics                            Anesthesia Physical Anesthesia Plan  ASA: II  Anesthesia Plan: General   Post-op Pain Management:    Induction:   PONV Risk Score and Plan: 4 or greater and Ondansetron, Dexamethasone, Midazolam and Scopolamine patch - Pre-op  Airway Management Planned: Oral ETT and Video Laryngoscope Planned  Additional Equipment: Arterial line  Intra-op Plan:   Post-operative Plan: Extubation in OR  Informed Consent: I have reviewed the patients History and Physical, chart, labs and discussed the procedure including the risks, benefits and alternatives for the proposed anesthesia with the patient or authorized representative who has indicated his/her understanding and acceptance.     Dental advisory given  Plan Discussed with: CRNA  Anesthesia Plan Comments:       Anesthesia Quick Evaluation

## 2019-10-02 NOTE — Progress Notes (Signed)
eLink Physician-Brief Progress Note Patient Name: Veronica [name] DOB: 1971/09/29 MRN: [id number]   Date of Service  10/02/2019  HPI/Events of Note  Agitation - Patient is on a Propofol IV infusion at ceiling of 50 mcg/kg/min.   eICU Interventions  Will order: 1. Increase ceiling on Precedex IV infusion to 80 mcg/kg/min.      Intervention Category Major Interventions: Delirium, psychosis, severe agitation - evaluation and management  Jeanette Rauth [place] 10/02/2019, 9:27 PM

## 2019-10-02 NOTE — Anesthesia Procedure Notes (Signed)
Arterial Line Insertion Start/End12/22/2020 2:20 PM, 10/02/2019 2:25 PM Performed by: Effie Berkshire, MD, anesthesiologist  Patient location: Pre-op. Preanesthetic checklist: patient identified, IV checked, site marked, risks and benefits discussed, surgical consent, monitors and equipment checked, pre-op evaluation, timeout performed and anesthesia consent Lidocaine 1% used for infiltration Left, radial was placed Catheter size: 20 Fr Hand hygiene performed  and maximum sterile barriers used   Attempts: 1 Procedure performed without using ultrasound guided technique. Following insertion, dressing applied. Post procedure assessment: normal and unchanged  Patient tolerated the procedure well with no immediate complications.

## 2019-10-02 NOTE — OR Nursing (Signed)
1727 Portable chest X-Ray performed to verify no retained objects.

## 2019-10-02 NOTE — Anesthesia Procedure Notes (Signed)
Procedure Name: Intubation Date/Time: 10/02/2019 3:03 PM Performed by: Kathryne Hitch, CRNA Pre-anesthesia Checklist: Patient identified, Emergency Drugs available, Suction available and Patient being monitored Patient Re-evaluated:Patient Re-evaluated prior to induction Oxygen Delivery Method: Circle system utilized Preoxygenation: Pre-oxygenation with 100% oxygen Induction Type: IV induction Laryngoscope Size: McGraph and 3 Grade View: Grade I Tube type: Oral Tube size: 7.0 mm Number of attempts: 1 Airway Equipment and Method: Stylet and Oral airway Placement Confirmation: ETT inserted through vocal cords under direct vision,  positive ETCO2 and breath sounds checked- equal and bilateral Secured at: 21 cm Tube secured with: Tape Dental Injury: Teeth and Oropharynx as per pre-operative assessment

## 2019-10-02 NOTE — Op Note (Addendum)
Patient name: Veronica [name] MRN: [id number] DOB: Mar 02, 1971 Sex: female  10/02/2019 Pre-operative Diagnosis: Superior vena cava occlusion Post-operative diagnosis:  Same Surgeon:  Annamarie Major Procedure:   #1: Ultrasound-guided access, right axillary vein   #2: Ultrasound-guided access, right common femoral vein   #3: Intravascular ultrasound (IVUS), superior vena cava, inferior vena cava, right innominate vein, and right subclavian vein   #4:INARI mechanical thrombectomy of the right subclavian vein, right brachiocephalic vein, and superior vena cava   #5: Venography of the right subclavian, right brachiocephalic, superior vena cava, and inferior vena cava   #6: Removal of right IJ Port-A-Cath Anesthesia: General Blood Loss: 900 cc Specimens: None  Findings: Initial imaging showed complete occlusion of the right brachiocephalic and superior vena cava.  Through and through access of the occlusion was obtained from the groin and from the arm.  TEE identified that there was no thrombus within the right atrium.  Mechanical thrombectomy using the INARI device was performed.  After the first pass, acute and chronic thrombus was evacuated.  However the patient became hypotensive.  I performed venography from the arm and groin that did not show any extravasation.  TEE reevaluated the heart which identified a pericardial effusion.  Dr. Cyndia Bent was consulted for pericardial window which was accomplished and resulted in improved hemodynamics.  Indications: This is a 48 year old female with metastatic breast cancer, undergoing chemotherapy following surgical excision.  She presented to the hospital with bilateral upper extremity and facial swelling.  Imaging revealed occlusion of the superior vena cava.  She did not get much benefit from IV heparin and so the decision was made to proceed with mechanical thrombectomy.  Risks and benefits of the procedure were discussed with the patient and she was willing  to proceed.  Procedure:  The patient was identified in the holding area and taken to [place] 16  The patient was then placed supine on the table. general anesthesia was administered.  The patient was prepped and draped in the usual sterile fashion.  A time out was called and antibiotics were administered.  Ultrasound was used to evaluate the right axillary and right common femoral vein both of which were widely patent and easily compressible.  The right axillary vein was cannulated under ultrasound guidance with a micropuncture needle.  A 018 wire was advanced followed by micropuncture sheath.  Next over a Bentson wire, a 5 French sheath was inserted.  Next the right common femoral vein was evaluated with ultrasound.  It was widely patent and easily compressible.  It was cannulated under ultrasound guidance with a 18-gauge needle.  An 035 wire was advanced without resistance and 8 French sheath was placed.  The patient was fully heparinized at this time.  Heparin levels were monitored and redosed based on ACT measurements.  Venography was performed through the axillary sheath which showed occlusion of the central venous system.  I used a Berenstein 2 catheter and a Glidewire advantage to cross the occlusion from the arm and from the groin.  Intravascular ultrasound was then performed from the groin access which identified thrombus within the superior vena cava, right innominate vein, and proximal right subclavian vein.  This appeared both chronic and acute.  It was at this point, I did not feel that the Port-A-Cath could be salvaged given the amount of fibrin sheath within the tip of the catheter.  The catheter tip was not very mobile.  I then opened the Port-A-Cath insertion site incision in the  upper right chest.  Cautery was used to dissect out the port.  The stitch was removed.  I cut the catheter from the hub which was removed.  I then inserted a Bentson wire through the catheter into the inferior vena  cava.  The catheter was then removed.  Next, the [facility] device was prepared on the back table.  This sheath was then inserted through the axillary cannulation site.  It opened properly.  The device was then loaded over the Glidewire advantage and placed into the inferior vena cava.  I then performed 1 pass of the INARI device for mechanical thrombectomy of the superior vena cava, right brachiocephalic vein, and right subclavian vein.  We inspected the contents of the basket on the back table.  There was both acute and chronic thrombus evacuated.  The device was then reinserted for a second pass.  However at this time the patient became more hypotensive.  I was initially concerned about a perforation within the vein and so venography was performed through the sheath in the groin as well as in the axilla.  This did not show any evidence of extravasation.  There was now a patent flow channel within the inferior vena cava and superior vena cava as well as the right brachiocephalic and subclavian vein.  Imaging also revealed that the pulmonary arteries were patent without any new filling defects.  There was some chronic thrombus remaining.  We then reimage the heart with TEE and noted a rather large pericardial effusion.  Anesthesia provided hemodynamic support with pressors.  I felt the patient was developing tamponade which explained her hypotension.  I consulted Dr. Cyndia Bent, who immediately came into the operating room.  We felt that pericardial window versus sternotomy was indicated.  I did not feel that additional mechanical thrombectomy was prudent at this point given the hemodynamic instability and so the wires were removed.  I removed the sheath in the right axilla, closing the venotomy site with a 3-0 nylon over a red rubber catheter.  I then converted the sheath in the right groin to a infusion catheter to help with venous access.  Dr. Cyndia Bent came in and performed a pericardial window.  Please see his operative  note for full details.  Once the pericardium was decompressed, the patient's hemodynamics significantly improved.  TEE identified that the pericardial effusion was completely evacuated.  There did not appear to be any significant ongoing bleeding.  A 36 French right angle tube was placed in the pericardium and brought out through a stab incision and sutured into place.  The midline incision was closed by reapproximating the fascia with 0 Vicryl in the subcutaneous tissue with additional layers of 3-0 Vicryl followed by subcuticular closure and Dermabond on the incision.  I then closed the incision from the Port-A-Cath removal with 2 layers of 3-0 Vicryl followed by Dermabond.  We elected to keep the patient intubated, due to her recent decompression of pericardial effusion as well as her Covid positive status.  She was hemodynamically stable.  She remained intubated taken to the ICU.  Disposition: To ICU, intubated in guarded condition   V. Annamarie Major, M.D., [facility] [place] Office: [phone] Pager:  [phone]

## 2019-10-03 ENCOUNTER — Inpatient Hospital Stay (HOSPITAL_COMMUNITY): Payer: 59

## 2019-10-03 ENCOUNTER — Other Ambulatory Visit: Payer: Self-pay

## 2019-10-03 DIAGNOSIS — I2699 Other pulmonary embolism without acute cor pulmonale: Secondary | ICD-10-CM

## 2019-10-03 DIAGNOSIS — D696 Thrombocytopenia, unspecified: Secondary | ICD-10-CM

## 2019-10-03 DIAGNOSIS — I314 Cardiac tamponade: Secondary | ICD-10-CM

## 2019-10-03 DIAGNOSIS — I871 Compression of vein: Secondary | ICD-10-CM

## 2019-10-03 LAB — COMPREHENSIVE METABOLIC PANEL
ALT: 61 U/L — ABNORMAL HIGH (ref 0–44)
AST: 33 U/L (ref 15–41)
Albumin: 4 g/dL (ref 3.5–5.0)
Alkaline Phosphatase: 68 U/L (ref 38–126)
Anion gap: 10 (ref 5–15)
BUN: <5 mg/dL — ABNORMAL LOW (ref 6–20)
CO2: 22 mmol/L (ref 22–32)
Calcium: 10.3 mg/dL (ref 8.9–10.3)
Chloride: 106 mmol/L (ref 98–111)
Creatinine, Ser: 0.72 mg/dL (ref 0.44–1.00)
GFR calc Af Amer: >60 mL/min (ref >60)
GFR calc non Af Amer: >60 mL/min (ref >60)
Glucose, Bld: 116 mg/dL — ABNORMAL HIGH (ref 70–99)
Potassium: 3.7 mmol/L (ref 3.5–5.1)
Sodium: 138 mmol/L (ref 135–145)
Total Bilirubin: 1.5 mg/dL — ABNORMAL HIGH (ref 0.3–1.2)
Total Protein: 5.8 g/dL — ABNORMAL LOW (ref 6.5–8.1)

## 2019-10-03 LAB — POCT I-STAT 7, (LYTES, BLD GAS, ICA,H+H)
Acid-base deficit: 9 mmol/L — ABNORMAL HIGH (ref 0.0–2.0)
Bicarbonate: 18.1 mmol/L — ABNORMAL LOW (ref 20.0–28.0)
Calcium, Ion: 1.43 mmol/L — ABNORMAL HIGH (ref 1.15–1.40)
HCT: 22 % — ABNORMAL LOW (ref 36.0–46.0)
Hemoglobin: 7.5 g/dL — ABNORMAL LOW (ref 12.0–15.0)
O2 Saturation: 100 %
Potassium: 3.8 mmol/L (ref 3.5–5.1)
Sodium: 134 mmol/L — ABNORMAL LOW (ref 135–145)
TCO2: 19 mmol/L — ABNORMAL LOW (ref 22–32)
pCO2 arterial: 45.4 mmHg (ref 32.0–48.0)
pH, Arterial: 7.209 — ABNORMAL LOW (ref 7.350–7.450)
pO2, Arterial: 328 mmHg — ABNORMAL HIGH (ref 83.0–108.0)

## 2019-10-03 LAB — CBC WITH DIFFERENTIAL/PLATELET
Abs Immature Granulocytes: 0.07 10*3/uL (ref 0.00–0.07)
Basophils Absolute: 0 10*3/uL (ref 0.0–0.1)
Basophils Relative: 3 %
Eosinophils Absolute: 0 10*3/uL (ref 0.0–0.5)
Eosinophils Relative: 2 %
HCT: 33.6 % — ABNORMAL LOW (ref 36.0–46.0)
Hemoglobin: 11.2 g/dL — ABNORMAL LOW (ref 12.0–15.0)
Immature Granulocytes: 7 %
Lymphocytes Relative: 19 %
Lymphs Abs: 0.2 10*3/uL — ABNORMAL LOW (ref 0.7–4.0)
MCH: 29.6 pg (ref 26.0–34.0)
MCHC: 33.3 g/dL (ref 30.0–36.0)
MCV: 88.9 fL (ref 80.0–100.0)
Monocytes Absolute: 0.1 10*3/uL (ref 0.1–1.0)
Monocytes Relative: 12 %
Neutro Abs: 0.6 10*3/uL — ABNORMAL LOW (ref 1.7–7.7)
Neutrophils Relative %: 57 %
Platelets: 31 10*3/uL — ABNORMAL LOW (ref 150–400)
RBC: 3.78 MIL/uL — ABNORMAL LOW (ref 3.87–5.11)
RDW: 14.3 % (ref 11.5–15.5)
WBC: 1 10*3/uL — CL (ref 4.0–10.5)
nRBC: 0 % (ref 0.0–0.2)

## 2019-10-03 LAB — ECHOCARDIOGRAM LIMITED
Height: 61 in
Weight: 3376 oz

## 2019-10-03 LAB — POCT ACTIVATED CLOTTING TIME
Activated Clotting Time: 153 seconds
Activated Clotting Time: 257 seconds
Activated Clotting Time: 109 seconds
Activated Clotting Time: 191 seconds

## 2019-10-03 LAB — TRIGLYCERIDES: Triglycerides: 85 mg/dL (ref <150)

## 2019-10-03 LAB — MAGNESIUM: Magnesium: 1.6 mg/dL — ABNORMAL LOW (ref 1.7–2.4)

## 2019-10-03 LAB — FERRITIN: Ferritin: 879 ng/mL — ABNORMAL HIGH (ref 11–307)

## 2019-10-03 LAB — C-REACTIVE PROTEIN: CRP: 3.3 mg/dL — ABNORMAL HIGH (ref <1.0)

## 2019-10-03 LAB — HEPARIN LEVEL (UNFRACTIONATED): Heparin Unfractionated: 0.29 IU/mL — ABNORMAL LOW (ref 0.30–0.70)

## 2019-10-03 LAB — D-DIMER, QUANTITATIVE: D-Dimer, Quant: 5.52 ug/mL-FEU — ABNORMAL HIGH (ref 0.00–0.50)

## 2019-10-03 IMAGING — DX DG CHEST 1V PORT
1 series · 1 of 1 positions shown · non-contrast
Comparison: [DATE]

CLINICAL DATA: History of cardiac tamponade.

EXAM:
PORTABLE CHEST 1 VIEW

[chest ap]
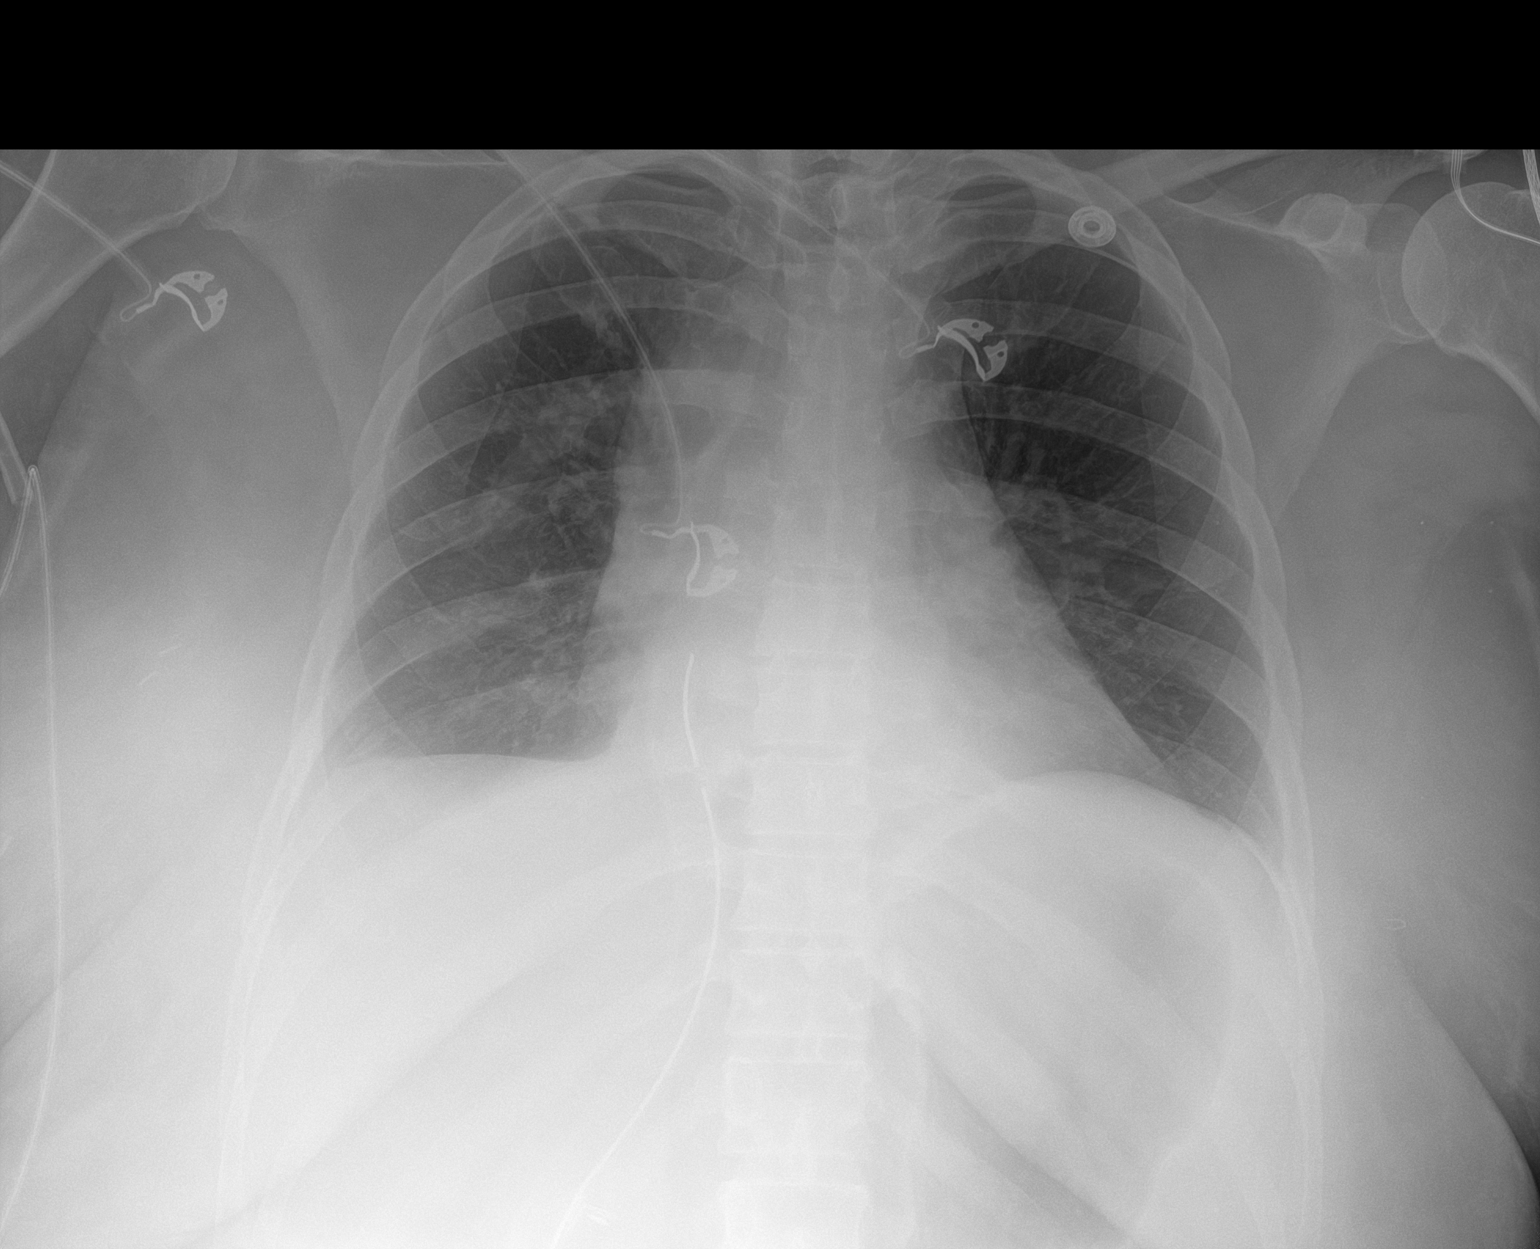

[1 of 1 positions shown; findings below may reference images not displayed]

FINDINGS: Probable pericardial drainage catheter on the right side. No change.
The endotracheal tube has been removed.

Suspect small right pleural effusion. No infiltrates or worrisome
pulmonary lesions. Stable slightly prominent mediastinal contours.
IMPRESSION: Interval removal of ET tube.

Stable pericardial drainage catheter.

Possible small right effusion.

## 2019-10-03 MED ORDER — MORPHINE SULFATE (PF) 2 MG/ML IV SOLN
2.0000 mg | Freq: Once | INTRAVENOUS | Status: AC
  Administered 2019-10-03: 2 mg via INTRAVENOUS
  Filled 2019-10-03: qty 1

## 2019-10-03 MED ORDER — MAGNESIUM SULFATE 2 GM/50ML IV SOLN
2.0000 g | Freq: Once | INTRAVENOUS | Status: AC
  Administered 2019-10-03: 2 g via INTRAVENOUS
  Filled 2019-10-03: qty 50

## 2019-10-03 MED ORDER — METOPROLOL TARTRATE 5 MG/5ML IV SOLN
5.0000 mg | Freq: Once | INTRAVENOUS | Status: AC
  Administered 2019-10-03: 5 mg via INTRAVENOUS
  Filled 2019-10-03: qty 5

## 2019-10-03 MED ORDER — HEPARIN (PORCINE) 25000 UT/250ML-% IV SOLN
1500.0000 [IU]/h | INTRAVENOUS | Status: DC
  Administered 2019-10-03: 1750 [IU]/h via INTRAVENOUS
  Administered 2019-10-03 – 2019-10-07 (×8): 1900 [IU]/h via INTRAVENOUS
  Filled 2019-10-03 (×10): qty 250

## 2019-10-03 MED ORDER — SODIUM CHLORIDE 0.9 % IV SOLN
INTRAVENOUS | Status: DC | PRN
  Administered 2019-10-03: 250 mL via INTRAVENOUS

## 2019-10-03 MED ORDER — ORAL CARE MOUTH RINSE
15.0000 mL | Freq: Two times a day (BID) | OROMUCOSAL | Status: DC
  Administered 2019-10-03 – 2019-10-16 (×21): 15 mL via OROMUCOSAL

## 2019-10-03 MED ORDER — MORPHINE SULFATE (PF) 2 MG/ML IV SOLN
2.0000 mg | INTRAVENOUS | Status: DC | PRN
  Administered 2019-10-03 – 2019-10-06 (×14): 2 mg via INTRAVENOUS
  Filled 2019-10-03 (×14): qty 1

## 2019-10-03 NOTE — Progress Notes (Signed)
Called by nurse due to tachycardia and tachypnea On camera check-heart rate 130s sinus on monitor, mildly tachypneic, saturations good on 2 L nasal cannula, purple lips and upper chest Per RN, pericardial drain not putting out much Is back on IV heparin since a.m.  Recommend- -stop Cipro although I doubt allergic reaction -Lopressor 5 mg IV x1 -Continue to monitor drain for output, limited echo to rule out tamponade -Discussed with Dr. Trula Slade, more likely that SVC has clotted off again -continue IV heparin for now, does not need IVC filter since SVC clot was more related to Port-A-Cath rather than embolic  Additional critical care time x 68m  Jameis Newsham V. Elsworth Soho MD

## 2019-10-03 NOTE — Progress Notes (Signed)
ANTICOAGULATION CONSULT NOTE  Pharmacy Consult for heparin Indication: pulmonary embolus   Patient Measurements: Height: 5\' 1"  (154.9 cm) Weight: 211 lb (95.7 kg) IBW/kg (Calculated) : 47.8 Heparin Dosing Weight: 71 kg  Vital Signs: Temp: 97.6 F (36.4 C) (12/23 0747) Temp Source: Oral (12/23 0747) Pulse Rate: 121 (12/23 1500)  Labs: Recent Labs    10/01/19 0705 10/01/19 1827 10/02/19 0730 10/02/19 1732 10/02/19 2017 10/03/19 0601 10/03/19 1627  HGB 9.2* 9.9* 9.1* 7.5* 9.5* 11.2*  --   HCT 27.8* 31.7* 27.9* 22.0* 28.0* 33.6*  --   PLT 33* 45* 50*  --   --  31*  --   HEPARINUNFRC 0.45  --  0.27*  --   --   --  0.29*  CREATININE  --   --  0.72  --   --  0.72  --       Assessment: Pharmacy consulted to dose/monitor heparin in this 48 year old female with PMH significant for breast cancer on chemotherapy with curative intent. CT angio on 12/18 revealed acute bilateral PE, acute occlusive SVC thrombosis. Pt was not on anticoagulation prior to admission. Notably, pt is also COVID +. Pt went to the OR yesterday for SVC thrombectomy - unfortunately, she developed a peri-op tamponade requiring a pericardial window. Heparin was held overnight and was resumed this morning. Over the past 24 hours, patient received 4 units pRBC. Her platelet count is low at 31K today, most likely due to chemotherapy; H/H 11.2/33.6.  Heparin level drawn ~7.5 hrs after re-initiation of heparin infusion at 1750 units/hr was 0.29 units/ml. Per RN, no issues with IV or bleeding observed; pt is having some serosanguinous drainage from pericardial drain.  Goal of Therapy:  Heparin level 0.3-0.7 units/ml Monitor platelets by anticoagulation protocol: Yes    Plan:  Increase heparin infusion to 1900 units/hr (no bolus) Check 6-hr heparin level Monitor daily heparin level, CBC Monitor for signs/symptoms of bleeding  Gillermina Hu, PharmD, BCPS, [facility] Clinical Pharmacist 10/03/2019 5:06 PM

## 2019-10-03 NOTE — Progress Notes (Signed)
eLink Physician-Brief Progress Note Patient Name: Veronica [name] DOB: 27-Jul-1971 MRN: [id number]   Date of Service  10/03/2019  HPI/Events of Note  Multiple issues: 1. Patient has Foley catheter. No order for Foley and 2. Request for SCD's for DVT prophylaxis.   eICU Interventions  Will order: 1. Insert Foley catheter. 2. Place SCD's to bilateral Lower extremities.      Intervention Category Major Interventions: Other:  Sherrod Toothman Cornelia Copa 10/03/2019, 5:59 AM

## 2019-10-03 NOTE — Progress Notes (Signed)
During report, RN mentioned that patient became flushed, tachycardic, and tachypneic during Cipro IVPB earlier in the day. The Cipro was stopped and patient began to improve approximately an hour afterwards. Per Dr. Bari Mantis note, "stop Cipro although I doubt allergic reaction" - no D/C order at that time. Notified Elink that there was a dose due at 0000 12/24. No signs of distress at this time. Will continue to monitor.

## 2019-10-03 NOTE — Progress Notes (Signed)
    Subjective  - POD #1  Remains intubated   Physical Exam:  Responds to commands Incisions ok Pericardial drain with minimal output       Assessment/Plan:  POD #1  SVC mechanical thrombectomy yesterday complicated by cardiac tamponade requiring pericardial window.  Her pericardial drain has had minimal drainage overnight.  Her heparin has been off since surgery.  I will restart her heparin back this am without a bolus.  Continue to monitor output from drain.    1 pass of the devices was made in the OR before she began having hemodynamic issues.  I did shoot a venogram after the first pass of the thrombectomy device, and this showed restored patency of the SVC.  Her main pulmonary arteries were also widely patent.  Hopefully with this result and heparin, her symptoms will improve.  Veronica [name] 10/03/2019 7:34 AM --  Vitals:   10/03/19 0500 10/03/19 0600  BP:    Pulse: 78 93  Resp: (!) 24 (!) 24  Temp:    SpO2: 100% 100%    Intake/Output Summary (Last 24 hours) at 10/03/2019 0734 Last data filed at 10/03/2019 0600 Gross per 24 hour  Intake 6115.67 ml  Output 2925 ml  Net 3190.67 ml     Laboratory CBC    Component Value Date/Time   WBC 1.0 (LL) 10/03/2019 0601   HGB 11.2 (L) 10/03/2019 0601   HGB 14.8 05/09/2019 1225   HCT 33.6 (L) 10/03/2019 0601   PLT 31 (L) 10/03/2019 0601   PLT 210 05/09/2019 1225    BMET    Component Value Date/Time   NA 138 10/03/2019 0601   K 3.7 10/03/2019 0601   CL 106 10/03/2019 0601   CO2 22 10/03/2019 0601   GLUCOSE 116 (H) 10/03/2019 0601   BUN <5 (L) 10/03/2019 0601   CREATININE 0.72 10/03/2019 0601   CREATININE 0.71 09/05/2019 1030   CALCIUM 10.3 10/03/2019 0601   GFRNONAA >60 10/03/2019 0601   GFRNONAA >60 09/05/2019 1030   GFRAA >60 10/03/2019 0601   GFRAA >60 09/05/2019 1030    COAG Lab Results  Component Value Date   INR 1.0 09/29/2019   INR 1.0 08/29/2019   No results found for:  PTT  Antibiotics Anti-infectives (From admission, onward)   Start     Dose/Rate Route Frequency Ordered Stop   10/02/19 1000  remdesivir 100 mg in sodium chloride 0.9 % 100 mL IVPB     100 mg 200 mL/hr over 30 Minutes Intravenous Daily 10/01/19 1121 10/06/19 0959   10/01/19 1200  ciprofloxacin (CIPRO) IVPB 400 mg     400 mg 200 mL/hr over 60 Minutes Intravenous Every 12 hours 10/01/19 1113     10/01/19 1130  remdesivir 200 mg in sodium chloride 0.9% 250 mL IVPB     200 mg 580 mL/hr over 30 Minutes Intravenous Once 10/01/19 1121 10/01/19 1301       V. Leia Alf, M.D., [facility] [place] Office: [phone] Pager:  [phone]

## 2019-10-03 NOTE — Progress Notes (Addendum)
Veronica [name]   DOB:[birth year]   KV#:[id number]   FIE#:[id number]  Subjective: Unknown was taken to the OR yesterday for thrombectomy which was complicated by a large hemopericardium with tamponade.  Cardiothoracic surgery was called emergently and she underwent a subxiphoid pericardial window.  She was extubated earlier today.  I was able to visit with Veronica [name] via [place].  She reports some chest discomfort and mild shortness of breath.  Thinks facial swelling is slowly improving.  She offers no other complaints today.  Objective: young white woman examined in bed Vitals:   10/03/19 1300 10/03/19 1400  BP:    Pulse: (!) 108 (!) 116  Resp: (!) 37 (!) 29  Temp:    SpO2: 93% 95%    Body mass index is 39.87 kg/m.  Intake/Output Summary (Last 24 hours) at 10/03/2019 1520 Last data filed at 10/03/2019 1400 Gross per 24 hour  Intake 6629.34 ml  Output 3215 ml  Net 3414.34 ml    CBG (last 3)  No results for input(s): GLUCAP in the last 72 hours.   Labs:  Lab Results  Component Value Date   WBC 1.0 (LL) 10/03/2019   HGB 11.2 (L) 10/03/2019   HCT 33.6 (L) 10/03/2019   MCV 88.9 10/03/2019   PLT 31 (L) 10/03/2019   NEUTROABS 0.6 (L) 10/03/2019    _0 @  Urine Studies No results for input(s): UHGB, CRYS in the last 72 hours.  Invalid input(s): UACOL, UAPR, USPG, UPH, UTP, UGL, UKET, UBIL, UNIT, UROB, [place], UEPI, UWBC, Duwayne Heck [place], [place]  Basic Metabolic Panel: Recent Labs  Lab 09/28/19 1652 09/29/19 0500 10/02/19 0730 10/02/19 1732 10/02/19 2017 10/03/19 0601  NA 136 137 136 134* 133* 138  K 3.3* 3.7 3.6 3.8 4.1 3.7  CL 100 102 101  --   --  106  CO2 21* 22 20*  --   --  22  GLUCOSE 171* 148* 159*  --   --  116*  BUN 24* 23* 8  --   --  <5*  CREATININE 0.79 0.78 0.72  --   --  0.72  CALCIUM 9.6 9.5 9.7  --   --  10.3  MG  --   --  1.8  --   --  1.6*   GFR Estimated Creatinine Clearance: 92.9 mL/min (by C-G formula based on SCr of 0.72 mg/dL). Liver  Function Tests: Recent Labs  Lab 09/29/19 0500 10/02/19 0730 10/03/19 0601  AST 23 18 33  ALT 34 28 61*  ALKPHOS 89 68 68  BILITOT 1.4* 0.9 1.5*  PROT 6.1* 6.1* 5.8*  ALBUMIN 3.7 3.7 4.0   No results for input(s): LIPASE, AMYLASE in the last 168 hours. No results for input(s): AMMONIA in the last 168 hours. Coagulation profile Recent Labs  Lab 09/29/19 0500  INR 1.0    CBC: Recent Labs  Lab 09/28/19 1652 09/30/19 0500 10/01/19 0705 10/01/19 1827 10/02/19 0730 10/02/19 1732 10/02/19 2017 10/03/19 0601  WBC 28.3* 2.8* 1.0* 0.9* 0.8*  --   --  1.0*  NEUTROABS 27.1*  --   --   --  0.2*  --   --  0.6*  HGB 11.1* 9.4* 9.2* 9.9* 9.1* 7.5* 9.5* 11.2*  HCT 34.7* 29.6* 27.8* 31.7* 27.9* 22.0* 28.0* 33.6*  MCV 94.0 93.4 91.1 97.2 92.1  --   --  88.9  PLT 69* 40* 33* 45* 50*  --   --  31*   Cardiac Enzymes: No results for input(s): CKTOTAL,  CKMB, CKMBINDEX, TROPONINI in the last 168 hours. BNP: Invalid input(s): POCBNP CBG: No results for input(s): GLUCAP in the last 168 hours. D-Dimer Recent Labs    10/02/19 0730 10/03/19 0601  DDIMER 1.29* 5.52*   Hgb A1c No results for input(s): HGBA1C in the last 72 hours. Lipid Profile Recent Labs    10/03/19 0601  TRIG 85   Thyroid function studies No results for input(s): TSH, T4TOTAL, T3FREE, THYROIDAB in the last 72 hours.  Invalid input(s): FREET3 Anemia work up Recent Labs    10/02/19 0730 10/03/19 0601  FERRITIN 786* 879*   Microbiology Recent Results (from the past 240 hour(s))  SARS CORONAVIRUS 2 (TAT 6-24 HRS) Nasopharyngeal Nasopharyngeal Swab     Status: Abnormal   Collection Time: 09/28/19  4:52 PM   Specimen: Nasopharyngeal Swab  Result Value Ref Range Status   SARS Coronavirus 2 POSITIVE (A) NEGATIVE Final    Comment: RESULT CALLED TO, READ BACK BY AND VERIFIED WITH: H. COOK,RN [id number] 09/29/2019 T. TYSOR (NOTE) SARS-CoV-2 target nucleic acids are DETECTED. The SARS-CoV-2 RNA is generally  detectable in upper and lower respiratory specimens during the acute phase of infection. Positive results are indicative of the presence of SARS-CoV-2 RNA. Clinical correlation with patient history and other diagnostic information is  necessary to determine patient infection status. Positive results do not rule out bacterial infection or co-infection with other viruses.  The expected result is Negative. Fact Sheet for Patients: SugarRoll.be Fact Sheet for Healthcare Providers: https://www.woods-mathews.com/ This test is not yet approved or cleared by the [place] FDA and  has been authorized for detection and/or diagnosis of SARS-CoV-2 by FDA under an Emergency Use Authorization (EUA). This EUA will remain  in effect (meaning this test can be used) for the  duration of the COVID-19 declaration under Section 564(b)(1) of the Act, 21 U.S.C. section 360bbb-3(b)(1), unless the authorization is terminated or revoked sooner. Performed at [facility], [place] [address] [place], [place] [address]   MRSA PCR Screening     Status: None   Collection Time: 10/02/19  8:57 PM   Specimen: Nasal Mucosa; Nasopharyngeal  Result Value Ref Range Status   MRSA by PCR NEGATIVE NEGATIVE Final    Comment:        The GeneXpert MRSA Assay (FDA approved for NASAL specimens only), is one component of a comprehensive MRSA colonization surveillance program. It is not intended to diagnose MRSA infection nor to guide or monitor treatment for MRSA infections. Performed at [facility], [place] [address], [place], [place] [address]       Studies:  DG CHEST PORT 1 VIEW  Result Date: 10/03/2019 CLINICAL DATA:  History of cardiac tamponade. EXAM: PORTABLE CHEST 1 VIEW COMPARISON:  10/02/2019 FINDINGS: Probable pericardial drainage catheter on the right side. No change. The endotracheal tube has been removed. Suspect small right pleural effusion. No  infiltrates or worrisome pulmonary lesions. Stable slightly prominent mediastinal contours. IMPRESSION: Interval removal of ET tube. Stable pericardial drainage catheter. Possible small right effusion. Electronically Signed   By: Marijo Sanes M.D.   On: 10/03/2019 12:18   DG Chest Portable 1 View  Addendum Date: 10/02/2019   ADDENDUM REPORT: 10/02/2019 17:48 ADDENDUM: These results were called by telephone at the time of interpretation on 10/02/2019 at 5:47 pm to provider Harold Barban , who verbally acknowledged these results. Electronically Signed   By: Zetta Bills M.D.   On: 10/02/2019 17:48   Result Date: 10/02/2019 CLINICAL DATA:  Instrument count, emergent case. Rule out foreign body in chest cavity. EXAM: PORTABLE CHEST 1 VIEW COMPARISON:  Chest x-ray of 09/24/2019 FINDINGS: Endotracheal tube approximately 3 cm above the carina. Mild shift from right to left of the trachea. Mild widening of mediastinal contours, potentially Lea somewhat accentuated by AP position. Right-sided chest tube or mediastinal drain is seen passing into the right cardiophrenic region. Lungs with mild increased interstitial markings or vascular crowding. No signs of radiopaque foreign body, unexpected with pacer defibrillator pad projecting over right upper chest. No acute bone process. IMPRESSION: 1. No unexpected radiopaque foreign body. 2. Mild widening of the mediastinal contours, potentially extension weighted by position but likely related to recent surgery in this location given right-sided chest tube. 3. Right-sided chest tube or mediastinal drain. No signs of pneumothorax. 4. A call is out to the referring provider further discuss findings in the above case. Electronically Signed: By: Zetta Bills M.D. On: 10/02/2019 17:43   ECHO INTRAOPERATIVE TEE  Result Date: 10/02/2019  *INTRAOPERATIVE TRANSESOPHAGEAL REPORT *  Patient Name:   Veronica [name] Date of Exam: 10/02/2019 Medical Rec #:  [id number]      Height:        61.0 in Accession #:    [id number]     Weight:       211.0 lb Date of Birth:  08-22-1971      BSA:          1.93 m Patient Age:    60 years       BP:           115/75 mmHg Patient Gender: F              HR:           100 bpm. Exam Location:  Anesthesiology Transesophogeal exam was perform intraoperatively during surgical procedure. Patient was closely monitored under general anesthesia during the entirety of examination. Indications:     TEE Evaluation of SVC Clot Performing Phys: [id number] BRABHAM Complications: No known complications during this procedure. SUMMARY  Initial Exam:  - Significant clot burden in the SVC, no clot noted in the IVC, RA or RV. Minimum to no flow through the SVC. - Small Pericardial effusion noted, no evidence of tamponade.  Post Thrombectomy:  - Significantly reduced clot burden in the SVC with improved flow on venogram. - Large, expanding circumferential pericardial effusion with evidence of tamponade, RV compression and hemodyanmic instability.  Post Pericardal Window:  - Resolution of the pericardial effusion, no evidence of tamponade remains. - LV function normal. LVEF > 65%. - Small clot burden in the SVC.  PRE-OP FINDINGS  Left Ventricle: The left ventricle has normal systolic function, with an ejection fraction of 60-65%. The cavity size was normal. There is no increase in left ventricular wall thickness. No evidence of left ventricular regional wall motion abnormalities. Right Ventricle: The right ventricle has normal systolic function. The cavity was normal. There is no increase in right ventricular wall thickness. Left Atrium: Left atrial size was normal in size. The left atrial appendage is well visualized and there is no evidence of thrombus present. Left atrial appendage velocity is normal at greater than 40 cm/s. Right Atrium: Right atrial size was normal in size. Right atrial pressure is estimated at 10 mmHg. Interatrial Septum: No atrial level shunt detected by  color flow Doppler. Pericardium: A small pericardial effusion is present. The pericardial effusion is localized near the right ventricle. There is no pleural  effusion. Mitral Valve: The mitral valve is normal in structure. No thickening of the mitral valve leaflet. No calcification of the mitral valve leaflet. Mitral valve regurgitation is trivial by color flow Doppler. There is no evidence of mitral valve vegetation. Tricuspid Valve: The tricuspid valve was normal in structure. Tricuspid valve regurgitation is trivial by color flow Doppler. The jet is directed centrally. No TV vegetation was visualized. Aortic Valve: The aortic valve is tricuspid Aortic valve regurgitation was not visualized by color flow Doppler. There is no evidence of aortic valve stenosis. There is no evidence of a vegetation on the aortic valve. Pulmonic Valve: The pulmonic valve was normal in structure. Pulmonic valve regurgitation is not visualized by color flow Doppler. Aorta: The aortic root, ascending aorta and aortic arch are normal in size and structure.  Suella Broad MD Electronically signed by Suella Broad MD Signature Date/Time: 10/02/2019/5:34:48 PM    Final    VAS [place] LOWER EXTREMITY VENOUS (DVT)  Result Date: 10/03/2019  Lower Venous Study Indications: Pulmonary embolism.  Comparison Study: no prior Performing Technologist: Abram Sander RVS  Examination Guidelines: A complete evaluation includes B-mode imaging, spectral Doppler, color Doppler, and power Doppler as needed of all accessible portions of each vessel. Bilateral testing is considered an integral part of a complete examination. Limited examinations for reoccurring indications may be performed as noted.  +---------+---------------+---------+-----------+----------+--------------+ RIGHT    CompressibilityPhasicitySpontaneityPropertiesThrombus Aging +---------+---------------+---------+-----------+----------+--------------+ CFV      None           Yes      Yes                   Acute          +---------+---------------+---------+-----------+----------+--------------+ SFJ      Full                                                        +---------+---------------+---------+-----------+----------+--------------+ FV Prox  Full                                                        +---------+---------------+---------+-----------+----------+--------------+ FV Mid   Full                                                        +---------+---------------+---------+-----------+----------+--------------+ FV DistalFull                                                        +---------+---------------+---------+-----------+----------+--------------+ PFV      None                                         Acute          +---------+---------------+---------+-----------+----------+--------------+ POP  None           Yes      Yes                  Acute          +---------+---------------+---------+-----------+----------+--------------+ PTV      Full                                                        +---------+---------------+---------+-----------+----------+--------------+ PERO     Full                                                        +---------+---------------+---------+-----------+----------+--------------+   +---------+---------------+---------+-----------+----------+--------------+ LEFT     CompressibilityPhasicitySpontaneityPropertiesThrombus Aging +---------+---------------+---------+-----------+----------+--------------+ CFV      Full           Yes      Yes                                 +---------+---------------+---------+-----------+----------+--------------+ SFJ      Full                                                        +---------+---------------+---------+-----------+----------+--------------+ FV Prox  Full                                                         +---------+---------------+---------+-----------+----------+--------------+ FV Mid   Full                                                        +---------+---------------+---------+-----------+----------+--------------+ FV DistalFull                                                        +---------+---------------+---------+-----------+----------+--------------+ PFV      Full                                                        +---------+---------------+---------+-----------+----------+--------------+ POP      Full           Yes      Yes                                 +---------+---------------+---------+-----------+----------+--------------+  PTV      Full                                                        +---------+---------------+---------+-----------+----------+--------------+ PERO     Full                                                        +---------+---------------+---------+-----------+----------+--------------+     Summary: Right: Findings consistent with acute deep vein thrombosis involving the right proximal profunda vein, right common femoral vein, and right popliteal vein. No cystic structure found in the popliteal fossa. Left: There is no evidence of deep vein thrombosis in the lower extremity. No cystic structure found in the popliteal fossa.  *See table(s) above for measurements and observations.    Preliminary    ECHOCARDIOGRAM LIMITED  Result Date: 10/03/2019   ECHOCARDIOGRAM LIMITED REPORT   Patient Name:   Veronica [name] Date of Exam: 10/03/2019 Medical Rec #:  [id number]      Height:       61.0 in Accession #:    [id number]     Weight:       211.0 lb Date of Birth:  [birth year]      BSA:          1.93 m Patient Age:    48 years       BP:           n/a mmHg Patient Gender: F              HR:           111 bpm. Exam Location:  Inpatient  Procedure: Limited Echo STAT ECHO Indications:    cardiac tamponade  History:        Patient has prior  history of Echocardiogram examinations, most                 recent 09/30/2019. Covid + . cancer.  Sonographer:    [name] RDCS (AE) Referring Phys: [id number] RAKESH V ALVA  Sonographer Comments: Technically difficult study due to poor echo windows, suboptimal parasternal window, suboptimal apical window and no subcostal window. Image acquisition challenging due to patient body habitus. challenging windows. IMPRESSIONS  1. Extremely limited; LV function appears to be preserved; focal wall motion abnormality cannot be excluded; no pericardial effusion noted on limited images; no other useful information could be obtained with this study. FINDINGS  Additional Comments: Extremely limited; LV function appears to be preserved; focal wall motion abnormality cannot be excluded; no pericardial effusion noted on limited images; no other useful information could be obtained with this study.  Kirk Ruths MD Electronically signed by Kirk Ruths MD Signature Date/Time: 10/03/2019/2:27:50 PM   Final    HYBRID OR IMAGING ([place])  Result Date: 10/02/2019 There is no interpretation for this exam.  This order is for images obtained during a surgical procedure.  Please See "Surgeries" Tab for more information regarding the procedure.    Assessment: 48 y.o. Veronica [name], Veronica [name] woman admitted with extensive SVC-associated clot/ PEs, complicated by clinical SVC syndrome, in the setting of early-stage breast cancer treatment as follows  (0) status post  right breast upper outer quadrant biopsy 05/01/2019 for a clinical T1c N1, stage IIA invasive ductal carcinoma, grade 3, estrogen and progesterone receptor positive, HER-2 nonamplified, with an MIB-1-1 of 20%.             (a) mass in the axillary tail was a positive lymph node   (1) MammaPrint obtained from the original biopsy shows a high risk luminal subtype B tumor   (2) genetics testing 05/09/2019 through the Common Hereditary Gene Panel offered by [facility] found no  deleterious mutations in APC, ATM, AXIN2, BARD1, BMPR1A, BRCA1, BRCA2, BRIP1, CDH1, CDK4, CDKN2A (p14ARF), CDKN2A (p16INK4a), CHEK2, CTNNA1, DICER1, EPCAM (Deletion/duplication testing only), GREM1 (promoter region deletion/duplication testing only), KIT, MEN1, MLH1, MSH2, MSH3, MSH6, MUTYH, NBN, NF1, NHTL1, PALB2, PDGFRA, PMS2, POLD1, POLE, PTEN, RAD50, RAD51C, RAD51D, RNF43, SDHB, SDHC, SDHD, SMAD4, SMARCA4. STK11, TP53, TSC1, TSC2, and VHL.  The following genes were evaluated for sequence changes only: SDHA and HOXB13 c.251G>A variant only.              (a) A variant of uncertain significance (VUS) was detected in one of her MSH6 genes (c.831A>C).   (3) status post right lumpectomy and sentinel lymph node sampling 06/12/2019 for a pT2 pN1, stage IIA invasive ductal carcinoma, grade 2, with positive margins             (a) a total of 4 sentinel lymph nodes removed, one positive (with ECE), ine itc             (b) margin clearance 04/19/2019 successful medial margin close but negative for DCIS   (4) adjuvant chemotherapy will consist of doxorubicin and cyclophosphamide in dose dense fashion x4 starting 07/10/2019 followed by weekly paclitaxel x12             (a) echo 06/26/2019 shows an EF of 60-65%             (b echo on 09/19/2019 shows an EF of 60-65%   (5) adjuvant radiation to follow   (6) antiestrogens to start at the completion of local treatment  (7) CT angio 09/28/2019 show bilateral pulmonary emboli and a filling defect in the SVC associated with port  (a) thrombectomy 10/02/2019 [facility] complicated by large hemopericardium and tamponade requiring subxiphoid pericardial window    Plan:  Veronica [name] is currently day 10 cycle 4 of her chemotherapy; this final cycle was given at reduced doses.  Total white blood cell count is 1.0 with an ANC of 0.6.  Platelet count is down to 31,000 today.  Hemoglobin is 11.2.  She received G-CSF with her chemotherapy and anticipate that her white  blood cell count will start to improve in the next few days.  She is status post 4 units PRBCs this admission.  Recommend close monitoring and transfuse for hemoglobin less than 8.  She has received 1 unit of platelets this admission.  Recommend platelet transfusion if her hemoglobin is less than 20,000 or active bleeding.  Will follow with you  Mikey Bussing, NP 10/03/2019  3:20 PM Medical Oncology and Hematology [facility] [address] [place], [place] [address] Tel. [phone]    Fax. [phone]   ADDENDUM: Veronica [name] has had a rough course but hopefully the thrombectomy will have resolved the clot and her SVC symptoms will subside over the next several days.   Normally she would be due for chemotherapy next week, but even if she is discharged by then I would wait an additional week. Also  I am not sure what our restrictions are at the [facility] re patients with a positive COVID19 diagnosis. Tentatively I am making Veronica [name] a return appointment with me for late next week. We can make definitive plans at that time.  I will be unavailable until 10/09/2019. If we can be of help before then, please consult my partners  I greatly appreciate your help to this patient!  Chauncey Cruel, MD Medical Oncology and Hematology [facility] [address] [place], [place] [address] Tel. [phone]    Fax. [phone]

## 2019-10-03 NOTE — Progress Notes (Signed)
ANTICOAGULATION CONSULT NOTE  Pharmacy Consult for heparin Indication: pulmonary embolus   Patient Measurements: Height: 5\' 1"  (154.9 cm) Weight: 211 lb (95.7 kg) IBW/kg (Calculated) : 47.8 Heparin Dosing Weight: 71 kg  Vital Signs: Temp: 97.6 F (36.4 C) (12/23 0747) Temp Source: Oral (12/23 0747) Pulse Rate: 95 (12/23 0813)  Labs: Recent Labs    09/30/19 2048 10/01/19 0705 10/01/19 1827 10/02/19 0730 10/02/19 2017 10/03/19 0601  HGB  --  9.2* 9.9* 9.1* 9.5* 11.2*  HCT  --  27.8* 31.7* 27.9* 28.0* 33.6*  PLT  --  33* 45* 50*  --  31*  HEPARINUNFRC 0.43 0.45  --  0.27*  --   --   CREATININE  --   --   --  0.72  --  0.72      Assessment: Pharmacy consulted to dose/monitor heparin in this 48 year old female with PMH significant for breast cancer on chemotherapy with curative intent. CT angio on 12/18 revealed acute bilateral PE, acute occlusive SVC thrombosis. Pt is not on anticoagulation prior to admission. Notably, pt is also COVID +. Pt went to the OR yesterday for SVC thrombectomy - unfortunately she developed a peri-op tamponade requiring a pericardial window. Heparin was held overnight and will now be resumed. Over the past 24 hours, patient received 4 units pRBC. Her platelet count is low at 31K today, most likely due to chemotherapy.   Goal of Therapy:  Heparin level 0.3-0.7 units/ml Monitor platelets by anticoagulation protocol: Yes    Plan:  -Heparin to 1750 units / hr  -Follow up post thrombectomy -Daily heparin level and CBC  -Hold heparin boluses   [name] 10/03/2019 8:29 AM

## 2019-10-03 NOTE — Progress Notes (Signed)
  Echocardiogram 2D Echocardiogram has been performed.  [name] 10/03/2019, 1:51 PM

## 2019-10-03 NOTE — Transfer of Care (Signed)
Immediate Anesthesia Transfer of Care Note  Patient: Veronica [name]  Procedure(s) Performed: PERCUTANEOUS  SVC THROMBECTOMY USING INARI (N/A ) Central VENOGRAM (N/A ) Intravascular Ultrasound (N/A ) Removal Port-A-Cath Insertion Central Line Adult Ultrasound Guidance For Vascular Access Pericardial Window (N/A )  Patient Location: ICU  Anesthesia Type:General  Level of Consciousness: Patient remains intubated per anesthesia plan  Airway & Oxygen Therapy: Patient remains intubated per anesthesia plan and Patient placed on Ventilator (see vital sign flow sheet for setting)  Post-op Assessment: Report given to RN and Post -op Vital signs reviewed and stable  Post vital signs: Reviewed and stable  Last Vitals:  Vitals Value Taken Time  BP    Temp 36.4 C 10/03/19 0747  Pulse 110 10/03/19 1019  Resp 18 10/03/19 1019  SpO2 97 % 10/03/19 1019  Vitals shown include unvalidated device data.  Last Pain:  Vitals:   10/03/19 0747  TempSrc: Oral  PainSc:       Patients Stated Pain Goal: 0 ([id number])  Complications: No apparent anesthesia complications

## 2019-10-03 NOTE — Progress Notes (Signed)
Extubated earlier today.  Post extubation she had significant chest pain with tachycardia which has now significantly improved.  She states that she is now resting comfortably but would like to get out of bed.  She also feels that her arm and facial swelling is slightly improved.  The patient's heparin was restarted this morning.  She has had approximately 70 cc of drainage out of her pericardial drain which is serosanguineous.  From my perspective, the patient can begin a diet.  I would like for her to get out of bed.  We will continue to titrate her heparin drip to the therapeutic range.  If there has been no significant increase in drainage in her pericardial tube, I will remove it tomorrow.  Veronica [name]

## 2019-10-03 NOTE — Progress Notes (Signed)
eLink Physician-Brief Progress Note Patient Name: Veronica [name] DOB: 03/09/1971 MRN: [id number]   Date of Service  10/03/2019  HPI/Events of Note  Per  Dr. Bari Mantis note Cipro to be discontinued on the outside chance Pt is allergic to it.  eICU Interventions  Cipro discontinued.        Kerry Kass Ogan 10/03/2019, 9:58 PM

## 2019-10-03 NOTE — Progress Notes (Signed)
Patient c/o chest pain and left arm pain 10/10.  Dr. Trula Slade notified as well as CCM.  [place] verbal order for pain management and 12 lead EKG.  Not to draw troponin d/t patient recent surgical intervention.  CCM ordered 1 time dose for 2mg  morphine.  EKG reading sinus tachycardia with low voltage QRS.

## 2019-10-03 NOTE — Procedures (Signed)
Extubation Procedure Note  Patient Details:   Name: Veronica [name] DOB: 04-30-1971 MRN: [id number]   Airway Documentation:    Vent end date: 10/03/19 Vent end time: 1003   Evaluation  O2 sats: stable throughout Complications: No apparent complications Patient did tolerate procedure well. Bilateral Breath Sounds: Clear, Diminished   Yes   Pt extubated to R/A, per MD request.  No stridor noted.  RN @ bedside.  Donnetta Hail 10/03/2019, 10:04 AM

## 2019-10-03 NOTE — Progress Notes (Addendum)
NAME:  Veronica [name], MRN:  [id number], DOB:  December 24, 1970, LOS: 5 ADMISSION DATE:  09/28/2019, CONSULTATION DATE:  12/22 REFERRING MD:  Dr. Louanne Belton , CHIEF COMPLAINT:  PE   Brief History   Veronica [name] is a 48 y.o. female who was admitted 12/18 with PE and SVC syndrome.  Taken to OR 12/22 for thrombectomy which was complicated by large hemopericardium and tamponade requiring subxyphoid pericardial window.  History of present illness   Pt is encephelopathic; therefore, this HPI is obtained from chart review. Veronica [name] is a 48 y.o. female who has a PMH including but not limited to breast CA s/p right lumpectomy and SLN sampling (T1cN1, stage IIA invasive ductal carcinoma, grade 3, ER/PR +, HER2 nonamplified) currently on chemo with plans for adjuvant radiation to follow and followed by Dr. Jana Hakim (see "past medical history" for rest).  She presented to [facility] ED 12/18 with facial and UE swelling for several days.  CTA demonstrated PE and occluded SVC.  COVID 19 returned positive (husband had been positive prior).  She was started on heparin and was transferred to [facility].  She was evaluated by vascular surgery and on 12/22, was taken to OR for thrombectomy. This was complicated by large hemopericardium with tamponade.  TCTS was called emergently and she had subxyphoid pericardial window placed.  Post op, she was left intubated and was transferred to the MICU for further management.  Past Medical History  S/P laparoscopic assisted vaginal hysterectomy (LAVH) Malignant neoplasm of upper-outer quadrant of right breast in female, estrogen receptor positive ([place]); Family history of leukemia; Family history of stomach cancer; Genetic testing; Port-A-Cath in place; Sepsis ([place]); Neutropenia with fever ([place]); Hematochezia; Anemia; Thrombocytopenia ([place]); Cough; Angioedema; Multiple subsegmental pulmonary emboli without acute cor pulmonale ([place]); Superior vena cava thrombosis (HCC); SVCO (superior vena cava  obstruction); SVC syndrome; and COVID-19 virus infection on their problem list.  Significant Hospital Events   12/18 > admit. 12/22 > to OR for thrombectomy which was complicated by large hemopericardium with tamponade requiring subxyphoid pericardial window.  Consults:  Vascular, TCTS, PCCM.   Procedures:  ETT 12/22 >  L radial art line 12/22 >  Right femoral CVL >>  Significant Diagnostic Tests:  CTA chest 12/18 > bilateral lower lobe PE, SVC occlusion, probable RLL infarct, small b/l effusions. CT head 12/18 > no acute findings. Echo 12/20 > EF 60-65%, small pericardial effusion. Echo 12/22 (post thrombectomy) > significantly reduced clot burden in the SVC with improved flow on the venogram.  Large expanding circumferential pericardial effusion with evidence of tamponade. Echo 12/22 (post pericardial window) > resolution of pericardial effusion without evidence of tamponade, LVEF > 65%, small clot burden in SVC.  Micro Data:  SARS CoV2 12/18 > positive.  Antimicrobials/COVID19 treatment  Remdesivir 12/21>> Interim history/subjective:  She is alert, following commands, sluggish on propofol, moving all extremities.  Just switched to pressure support ventilation, 8/5.  Minimal secretions.  Objective   Blood pressure (!) 117/58, pulse 95, temperature 97.6 F (36.4 C), temperature source Oral, resp. rate (!) 25, height _0  (1.549 m), weight 95.7 kg, SpO2 100 %.    Vent Mode: PRVC FiO2 (%):  [50 %-100 %] 50 % Set Rate:  [24 bmp] 24 bmp Vt Set:  [380 mL] 380 mL PEEP:  [10 cmH20] 10 cmH20 Plateau Pressure:  [18 cmH20-23 cmH20] 18 cmH20   Intake/Output Summary (Last 24 hours) at 10/03/2019 0819 Last data filed at 10/03/2019 0700 Gross per 24 hour  Intake 6160.13 ml  Output 2925 ml  Net 3235.13 ml   Filed Weights   09/28/19 1659  Weight: 95.7 kg    Examination: General: Obese, chronically ill-appearing, appears older than chronological age well-nourished woman,  semi-Fowlers in bed, moving self about and appropriately HENT: Normocephalic, pupils equal round reactive to light.  Moist mucous membranes. Lungs: Bilateral breath sounds diminished at bases, otherwise clear, full, nonlabored, symmetrical.  On pressure support ventilation. Cardiovascular: Mediastinal tube.  Pericardial drain.  Minimal output.  Rectal incision clean dry intact.  S1-S2 distant, no rub or murmur. Abdomen: Rounded, protuberant.  Positive bowel sounds x4.  Soft, nontender, nondistended. Extremities: Right femoral line, no deformity.  Generalized edema Neuro: Alert, following commands, denies pain, moves all extremities purposefully x4. GU: Blood-tinged urine, Foley in place.  Resolved Hospital Problem list   Acute SVC syndrome  Acute Pulmonary embolism -CTA chest 12/18 with bilateral lower lobe PE and findings compatible with acutely occluded catheter associated SVC syndrome  S/p operative correction of occulted SVD 12/22  Plan: Primary management per vascular   Pericardial tamponade post thrombectomy status post window Pericardial chest tube to drainage per CTS Monitor chest tube output  Further anticoagulation per Vascular/CTS-Lantus start heparin today, no bolus Obtain venous duplex for completion, if unable to anticoagulate for long may need IVC filter  Acute Respiratory Failure -In the setting of elective procedure to remove SVC thrombus, patient developed pericardial effusion post thrombectomy as above, remained intubated post procedure  Plan: Continue ventilator support with lung protective strategies  Wean PEEP and FiO2 for sats greater than 90%. Follow x-ray/ABG prn Head of bed elevated 30 degrees. Plateau pressures less than 30 cm H20.  SAT/SBT as tolerated-doing well this morning.  Hope for early vent liberation. Ensure adequate pulmonary hygiene  VAP bundle in place  PAD protocol  COVID19 infection  -Thus far has remained asymptomatic  Plan: Trend  inflammatory markers  Airborne precautions  No steroid therapy since was not hypoxic Remdesivir started 12/21  Thrombocytopenia -Currently on chemo for breast cancer  - -received 1 unit prior to surgery 12/22 Plan: Trend Platelets Monitor for signs of bleeding Follow transfusion protocol Started on prophylactic Cipro per oncology   Leukopenia  -Currently receiving chemo for breast cancer as below  Plan: Trend WBC  No signs of acute infection currently   Hypokalemia  Plan: Trend  Supplement as needed   Hypomagnesia Plan: Supplement  Hypertension Plan: Blood pressure appears controlled right now. Monitor and treat as needed for systolic greater than 588  Breast cacer on chemotherapy  -per oncology's note she is 6 cycle 4 of chemo as of 12/19. She received Neulasta 12/14.  Plan: Management per Oncology  Monitor thrombocytopenia and leukopenia as above   Assessment & Plan:    Best practice:  Diet: NPO.  Tube feeds today if unable to extubate Pain/Anxiety/Delirium protocol (if indicated): PRN fentanyl and versed  VAP protocol (if indicated): In place  DVT prophylaxis: SCD, heparin infusion initiated by vascular GI prophylaxis: PPI Glucose control: SSI Mobility: Bedrest Code Status: Full Family Communication: Will update  Disposition: ICU   Labs   CBC: Recent Labs  Lab 09/28/19 1652 09/30/19 0500 10/01/19 0705 10/01/19 1827 10/02/19 0730 10/02/19 2017 10/03/19 0601  WBC 28.3* 2.8* 1.0* 0.9* 0.8*  --  1.0*  NEUTROABS 27.1*  --   --   --  0.2*  --  0.6*  HGB 11.1* 9.4* 9.2* 9.9* 9.1* 9.5* 11.2*  HCT 34.7* 29.6* 27.8* 31.7* 27.9* 28.0*  33.6*  MCV 94.0 93.4 91.1 97.2 92.1  --  88.9  PLT 69* 40* 33* 45* 50*  --  31*    Basic Metabolic Panel: Recent Labs  Lab 09/28/19 1652 09/29/19 0500 10/02/19 0730 10/02/19 2017 10/03/19 0601  NA 136 137 136 133* 138  K 3.3* 3.7 3.6 4.1 3.7  CL 100 102 101  --  106  CO2 21* 22 20*  --  22  GLUCOSE 171* 148*  159*  --  116*  BUN 24* 23* 8  --  <5*  CREATININE 0.79 0.78 0.72  --  0.72  CALCIUM 9.6 9.5 9.7  --  10.3  MG  --   --  1.8  --  1.6*   GFR: Estimated Creatinine Clearance: 92.9 mL/min (by C-G formula based on SCr of 0.72 mg/dL). Recent Labs  Lab 10/01/19 0705 10/01/19 1827 10/02/19 0730 10/03/19 0601  WBC 1.0* 0.9* 0.8* 1.0*    Liver Function Tests: Recent Labs  Lab 09/29/19 0500 10/02/19 0730 10/03/19 0601  AST 23 18 33  ALT 34 28 61*  ALKPHOS 89 68 68  BILITOT 1.4* 0.9 1.5*  PROT 6.1* 6.1* 5.8*  ALBUMIN 3.7 3.7 4.0     The patient is critically ill with respiratory failure. She requires ICU for high complexity decision making, titration of high alert medications, ventilator management, titration of oxygen and interpretation of advanced monitoring.    I personally spent 35 minutes providing critical care services including personally reviewing test results, discussing care with nursing staff/other physicians and completing orders pertaining to this patient.  Time was exclusive to the patient and does not include time spent teaching or in procedures.  Voice recognition software was used in the production of this record.  Errors in interpretation may have been inadvertently missed during review.  Francine Graven, MSN, AGACNP  [facility]   Started on IV heparin by vascular this a.m. Examined on vent. On low-dose propofol, afebrile, minimal output from pericardial drain, awake and alert, nonfocal, soft nontender abdomen, decreased breath sounds bilateral.  Facial swelling as prior Chest x-ray 12/22 personally reviewed which shows ET tube in position and cardiomegaly with pericardial drain.  Labs show normal electrolytes except for hypomagnesemia, pancytopenia  Impression/plan SVC syndrome -status post thrombectomy with vascular, Port-A-Cath has been removed, obtain venous duplex for completion, hope IVC filter will not be necessary  Hemopericardium/tamponade-status post window, watch drain now that IV heparin started Postop respiratory failure-she was weaned on pressure support and extubated to nasal cannula Covid infection-being treated with remdesivir, not hypoxic hence not started on steroids Pancytopenia-watch platelets closely, may need platelet transfusion before pulling pericardial drain, defer to T CTS Hypomagnesemia will be repleted  If she remains stable today, can transfer back to triad service, PCCM available as needed  The patient is critically ill with multiple organ systems failure and requires high complexity decision making for assessment and support, frequent evaluation and titration of therapies, application of advanced monitoring technologies and extensive interpretation of multiple databases. Critical Care Time devoted to patient care services described in this note independent of APP/resident  time is 35 minutes.   Leanna Sato Elsworth Soho MD

## 2019-10-03 NOTE — Progress Notes (Signed)
Lower extremity venous has been completed.   Preliminary results in CV Proc.   Abram Sander 10/03/2019 11:11 AM

## 2019-10-04 DIAGNOSIS — D61810 Antineoplastic chemotherapy induced pancytopenia: Secondary | ICD-10-CM

## 2019-10-04 DIAGNOSIS — I312 Hemopericardium, not elsewhere classified: Secondary | ICD-10-CM

## 2019-10-04 DIAGNOSIS — I82210 Acute embolism and thrombosis of superior vena cava: Secondary | ICD-10-CM

## 2019-10-04 LAB — BPAM RBC
Blood Product Expiration Date: 202101252359
Blood Product Expiration Date: 202101252359
Blood Product Expiration Date: 202101252359
Blood Product Expiration Date: 202101252359
ISSUE DATE / TIME: 202012221602
ISSUE DATE / TIME: 202012221602
ISSUE DATE / TIME: 202012222124
ISSUE DATE / TIME: 202012230116
Unit Type and Rh: 6200
Unit Type and Rh: 6200
Unit Type and Rh: 6200
Unit Type and Rh: 6200

## 2019-10-04 LAB — TYPE AND SCREEN
ABO/RH(D): A POS
Antibody Screen: NEGATIVE
Unit division: 0
Unit division: 0
Unit division: 0
Unit division: 0

## 2019-10-04 LAB — CBC WITH DIFFERENTIAL/PLATELET
Abs Immature Granulocytes: 0.09 10*3/uL — ABNORMAL HIGH (ref 0.00–0.07)
Basophils Absolute: 0.1 10*3/uL (ref 0.0–0.1)
Basophils Relative: 3 %
Eosinophils Absolute: 0 10*3/uL (ref 0.0–0.5)
Eosinophils Relative: 2 %
HCT: 33 % — ABNORMAL LOW (ref 36.0–46.0)
Hemoglobin: 11 g/dL — ABNORMAL LOW (ref 12.0–15.0)
Immature Granulocytes: 4 %
Lymphocytes Relative: 13 %
Lymphs Abs: 0.3 10*3/uL — ABNORMAL LOW (ref 0.7–4.0)
MCH: 30.1 pg (ref 26.0–34.0)
MCHC: 33.3 g/dL (ref 30.0–36.0)
MCV: 90.2 fL (ref 80.0–100.0)
Monocytes Absolute: 0.4 10*3/uL (ref 0.1–1.0)
Monocytes Relative: 17 %
Neutro Abs: 1.5 10*3/uL — ABNORMAL LOW (ref 1.7–7.7)
Neutrophils Relative %: 61 %
Platelets: 39 10*3/uL — ABNORMAL LOW (ref 150–400)
RBC: 3.66 MIL/uL — ABNORMAL LOW (ref 3.87–5.11)
RDW: 14.7 % (ref 11.5–15.5)
WBC: 2.4 10*3/uL — ABNORMAL LOW (ref 4.0–10.5)
nRBC: 0 % (ref 0.0–0.2)

## 2019-10-04 LAB — COMPREHENSIVE METABOLIC PANEL
ALT: 38 U/L (ref 0–44)
AST: 14 U/L — ABNORMAL LOW (ref 15–41)
Albumin: 3.2 g/dL — ABNORMAL LOW (ref 3.5–5.0)
Alkaline Phosphatase: 62 U/L (ref 38–126)
Anion gap: 11 (ref 5–15)
BUN: 7 mg/dL (ref 6–20)
CO2: 20 mmol/L — ABNORMAL LOW (ref 22–32)
Calcium: 9.1 mg/dL (ref 8.9–10.3)
Chloride: 102 mmol/L (ref 98–111)
Creatinine, Ser: 0.9 mg/dL (ref 0.44–1.00)
GFR calc Af Amer: >60 mL/min (ref >60)
GFR calc non Af Amer: >60 mL/min (ref >60)
Glucose, Bld: 185 mg/dL — ABNORMAL HIGH (ref 70–99)
Potassium: 3.6 mmol/L (ref 3.5–5.1)
Sodium: 133 mmol/L — ABNORMAL LOW (ref 135–145)
Total Bilirubin: 1.4 mg/dL — ABNORMAL HIGH (ref 0.3–1.2)
Total Protein: 5 g/dL — ABNORMAL LOW (ref 6.5–8.1)

## 2019-10-04 LAB — C-REACTIVE PROTEIN: CRP: 18.7 mg/dL — ABNORMAL HIGH (ref <1.0)

## 2019-10-04 LAB — FERRITIN: Ferritin: 164 ng/mL (ref 11–307)

## 2019-10-04 LAB — HEPARIN LEVEL (UNFRACTIONATED)
Heparin Unfractionated: 0.5 IU/mL (ref 0.30–0.70)
Heparin Unfractionated: 0.5 IU/mL (ref 0.30–0.70)

## 2019-10-04 LAB — D-DIMER, QUANTITATIVE: D-Dimer, Quant: 2.5 ug/mL-FEU — ABNORMAL HIGH (ref 0.00–0.50)

## 2019-10-04 LAB — MAGNESIUM: Magnesium: 1.9 mg/dL (ref 1.7–2.4)

## 2019-10-04 MED ORDER — PANTOPRAZOLE SODIUM 40 MG PO TBEC
40.0000 mg | DELAYED_RELEASE_TABLET | Freq: Every day | ORAL | Status: DC
  Administered 2019-10-04 – 2019-10-08 (×5): 40 mg via ORAL
  Filled 2019-10-04 (×5): qty 1

## 2019-10-04 NOTE — Progress Notes (Addendum)
    Subjective  - POD #2  Chest pain is much better No trouble breating   Physical Exam:  Suture from right axillary cannulation site removed today and pressure dressing placed  Pericardial drain with large output yesterday.  Mostly sero-sanguinous Still with bilateral upper extremity edema       Assessment/Plan:  POD #2  -Will keep pericardial drain in place until output is minimal - continue heparin drip, with plans to convert to DOAC once drain removed -anticipate upper extremity edema will persist for several days.  Facial edema is improved -Husband updated via telephone  Wells Lytle Malburg 10/04/2019 2:34 PM --  Vitals:   10/04/19 1100 10/04/19 1200  BP:    Pulse: (!) 117 (!) 120  Resp: (!) 8 (!) 30  Temp:  100.1 F (37.8 C)  SpO2: 96% 96%    Intake/Output Summary (Last 24 hours) at 10/04/2019 1434 Last data filed at 10/04/2019 1200 Gross per 24 hour  Intake 610.61 ml  Output 1390 ml  Net -779.39 ml     Laboratory CBC    Component Value Date/Time   WBC 2.4 (L) 10/04/2019 0454   HGB 11.0 (L) 10/04/2019 0454   HGB 14.8 05/09/2019 1225   HCT 33.0 (L) 10/04/2019 0454   PLT 39 (L) 10/04/2019 0454   PLT 210 05/09/2019 1225    BMET    Component Value Date/Time   NA 133 (L) 10/04/2019 0454   K 3.6 10/04/2019 0454   CL 102 10/04/2019 0454   CO2 20 (L) 10/04/2019 0454   GLUCOSE 185 (H) 10/04/2019 0454   BUN 7 10/04/2019 0454   CREATININE 0.90 10/04/2019 0454   CREATININE 0.71 09/05/2019 1030   CALCIUM 9.1 10/04/2019 0454   GFRNONAA >60 10/04/2019 0454   GFRNONAA >60 09/05/2019 1030   GFRAA >60 10/04/2019 0454   GFRAA >60 09/05/2019 1030    COAG Lab Results  Component Value Date   INR 1.0 09/29/2019   INR 1.0 08/29/2019   No results found for: PTT  Antibiotics Anti-infectives (From admission, onward)   Start     Dose/Rate Route Frequency Ordered Stop   10/02/19 1000  remdesivir 100 mg in sodium chloride 0.9 % 100 mL IVPB     100  mg 200 mL/hr over 30 Minutes Intravenous Daily 10/01/19 1121 10/06/19 0959   10/01/19 1200  ciprofloxacin (CIPRO) IVPB 400 mg  Status:  Discontinued     400 mg 200 mL/hr over 60 Minutes Intravenous Every 12 hours 10/01/19 1113 10/03/19 2154   10/01/19 1130  remdesivir 200 mg in sodium chloride 0.9% 250 mL IVPB     200 mg 580 mL/hr over 30 Minutes Intravenous Once 10/01/19 1121 10/01/19 1301       V. Leia Alf, M.D., [facility] [place] Office: [phone] Pager:  [phone]

## 2019-10-04 NOTE — Progress Notes (Signed)
NAME:  Veronica [name], MRN:  [id number], DOB:  July 31, 1971, LOS: 6 ADMISSION DATE:  09/28/2019, CONSULTATION DATE:  12/22 REFERRING MD:  Dr. Louanne Belton , CHIEF COMPLAINT:  PE   Brief History   48 yo female admitted with PE and SVC syndrome with hx of breast cancer.  Had thrombectomy on [id number] complicated by hemopericardium and tamponade requiring subxyphoid pericardial window.  Incidentally found to be positive for COVID 19.  Past Medical History  Breast cancer ER positive, HTN  Significant Hospital Events   12/18 admit. 12/22 to OR for thrombectomy which was complicated by large hemopericardium with tamponade requiring subxyphoid pericardial window. 12/23 extubated  Consults:  Vascular, TCTS, Oncology  Procedures:  ETT 12/22 > 12/23 L radial art line 12/22 >  Right femoral CVL >>  Significant Diagnostic Tests:  CTA chest 12/18 > bilateral lower lobe PE, SVC occlusion, probable RLL infarct, small b/l effusions. CT head 12/18 > no acute findings. Echo 12/20 > EF 60-65%, small pericardial effusion. Echo 12/22 (post thrombectomy) > significantly reduced clot burden in the SVC with improved flow on the venogram.  Large expanding circumferential pericardial effusion with evidence of tamponade. Echo 12/22 (post pericardial window) > resolution of pericardial effusion without evidence of tamponade, LVEF > 65%, small clot burden in SVC.  Micro Data:  SARS CoV2 12/18 > positive.  Antimicrobials/COVID19 treatment  Remdesivir 12/21>>  Interim history/subjective:  Has mild chest discomfort.  Not having cough, sputum.  Breathing okay.  Tolerating diet.  Objective   Blood pressure (!) 117/58, pulse (!) 122, temperature 98.5 F (36.9 C), temperature source Oral, resp. rate 20, height 5\' 1"  (1.549 m), weight 95.7 kg, SpO2 94 %.    PEEP:  [5 cmH20] 5 cmH20 Pressure Support:  [5 cmH20] 5 cmH20   Intake/Output Summary (Last 24 hours) at 10/04/2019 [id number] Last data filed at 10/04/2019 0700  Gross per 24 hour  Intake 877.61 ml  Output 1400 ml  Net -522.39 ml   Filed Weights   09/28/19 1659  Weight: 95.7 kg    Examination:  General - alert Eyes - pupils reactive ENT - no sinus tenderness, no stridor Cardiac - regular rate/rhythm, no murmur, chest drain in place Chest - equal breath sounds b/l, no wheezing or rales Abdomen - soft, non tender, + bowel sounds Extremities - no cyanosis, clubbing, or edema Skin - no rashes Neuro - normal strength, moves extremities, follows commands Psych - normal mood and behavior   Resolved Hospital Problem list   Acute respiratory failure with hypoxia  Assessment/plan:   Acute PE with acute SVC syndrome s/p mechanical thrombectomy complicated by hemopericardium with tamponade s/p window. - chest drain per surgery - continue heparin gtt  COVID 19 infection with initial concern for COVID 19 pneumonia. - day 4/5 of remdesivir  Chemotherapy induced pancytopenia. ER positive breast cancer. - f/u CBC - oncology following  Hx of HTN. - monitor hemodynamics - hold outpt benicar, lasix  Hx of GERD. - continue protonix   Best practice:  Diet: regular diet DVT prophylaxis: heparin gtt GI prophylaxis: PPI Mobility: OOB to chair Code Status: Full Disposition: ICU   Labs    CMP Latest Ref Rng & Units 10/04/2019 10/03/2019 10/02/2019  Glucose 70 - 99 mg/dL 185(H) 116(H) -  BUN 6 - 20 mg/dL 7 <5(L) -  Creatinine 0.44 - 1.00 mg/dL 0.90 0.72 -  Sodium 135 - 145 mmol/L 133(L) 138 133(L)  Potassium 3.5 - 5.1 mmol/L 3.6 3.7 4.1  Chloride 98 -  111 mmol/L 102 106 -  CO2 22 - 32 mmol/L 20(L) 22 -  Calcium 8.9 - 10.3 mg/dL 9.1 10.3 -  Total Protein 6.5 - 8.1 g/dL 5.0(L) 5.8(L) -  Total Bilirubin 0.3 - 1.2 mg/dL 1.4(H) 1.5(H) -  Alkaline Phos 38 - 126 U/L 62 68 -  AST 15 - 41 U/L 14(L) 33 -  ALT 0 - 44 U/L 38 61(H) -    CBC Latest Ref Rng & Units 10/04/2019 10/03/2019 10/02/2019  WBC 4.0 - 10.5 K/uL 2.4(L) 1.0(LL) -   Hemoglobin 12.0 - 15.0 g/dL 11.0(L) 11.2(L) 9.5(L)  Hematocrit 36.0 - 46.0 % 33.0(L) 33.6(L) 28.0(L)  Platelets 150 - 400 K/uL 39(L) 31(L) -    ABG    Component Value Date/Time   PHART 7.338 (L) 10/02/2019 2017   PCO2ART 40.7 10/02/2019 2017   PO2ART 353.0 (H) 10/02/2019 2017   HCO3 21.9 10/02/2019 2017   TCO2 23 10/02/2019 2017   ACIDBASEDEF 4.0 (H) 10/02/2019 2017   O2SAT 100.0 10/02/2019 2017    Chesley Mires, MD [facility] 10/04/2019, 9:39 AM

## 2019-10-04 NOTE — Progress Notes (Signed)
Spoke with pt's husband over the phone.  Updated current status and treatment plan.  Explained main issue is amount of outpt from chest tube, and we are working in coordination with TCTS and VVS to monitor this while she is on heparin gtt.    Chesley Mires, MD [facility] 10/04/2019, 11:22 AM

## 2019-10-04 NOTE — Progress Notes (Signed)
Patient ID: Veronica [name], female   DOB: 05/19/1971, 48 y.o.   MRN: [id number] TCTS:  Her pericardial tube output has increased some since heparin started but Hgb has been stable. I would leave this in until minimal drainage.

## 2019-10-04 NOTE — Progress Notes (Signed)
ANTICOAGULATION CONSULT NOTE  Pharmacy Consult for heparin Indication: pulmonary embolus   Patient Measurements: Height: 5\' 1"  (154.9 cm) Weight: 211 lb (95.7 kg) IBW/kg (Calculated) : 47.8 Heparin Dosing Weight: 71 kg  Vital Signs: Temp: 97.5 F (36.4 C) (12/23 2000) Temp Source: Oral (12/23 2000) Pulse Rate: 110 (12/24 0000)  Labs: Recent Labs    10/01/19 0705 10/01/19 1827 10/02/19 0730 10/02/19 1732 10/02/19 2017 10/03/19 0601 10/03/19 1627 10/03/19 2330  HGB   < > 9.9* 9.1* 7.5* 9.5* 11.2*  --   --   HCT   < > 31.7* 27.9* 22.0* 28.0* 33.6*  --   --   PLT  --  45* 50*  --   --  31*  --   --   HEPARINUNFRC  --   --  0.27*  --   --   --  0.29* 0.50  CREATININE  --   --  0.72  --   --  0.72  --   --    < > = values in this interval not displayed.      Assessment: Pharmacy consulted to dose/monitor heparin in this 48 year old female with PMH significant for breast cancer on chemotherapy with curative intent. CT angio on 12/18 revealed acute bilateral PE, acute occlusive SVC thrombosis. Heparin level 0.50 units/ml Goal of Therapy:  Heparin level 0.3-0.7 units/ml Monitor platelets by anticoagulation protocol: Yes   Plan:   Continue heparin at 1900 units/ml Monitor daily heparin level, CBC Monitor for signs/symptoms of bleeding  Thanks for allowing pharmacy to be a part of this patient's care.  Excell Seltzer, PharmD Clinical Pharmacist 10/04/2019 12:39 AM

## 2019-10-04 NOTE — Anesthesia Postprocedure Evaluation (Signed)
Anesthesia Post Note  Patient: Veronica [name]  Procedure(s) Performed: PERCUTANEOUS  SVC THROMBECTOMY USING INARI (N/A ) Central VENOGRAM (N/A ) Intravascular Ultrasound (N/A ) Removal Port-A-Cath Insertion Central Line Adult Ultrasound Guidance For Vascular Access Pericardial Window (N/A )     Anesthesia Post Evaluation  Last Vitals:  Vitals:   10/04/19 1600 10/04/19 1700  BP:    Pulse: (!) 123 (!) 127  Resp: (!) 26 (!) 25  Temp:    SpO2: 98% 95%    Last Pain:  Vitals:   10/04/19 1609  TempSrc:   PainSc: 3    Pain Goal: Patients Stated Pain Goal: 4 (10/03/19 1338)                 Effie Berkshire

## 2019-10-05 ENCOUNTER — Other Ambulatory Visit: Payer: Self-pay

## 2019-10-05 DIAGNOSIS — R000 Tachycardia, unspecified: Secondary | ICD-10-CM

## 2019-10-05 LAB — CBC WITH DIFFERENTIAL/PLATELET
Abs Immature Granulocytes: 0.33 10*3/uL — ABNORMAL HIGH (ref 0.00–0.07)
Basophils Absolute: 0.1 10*3/uL (ref 0.0–0.1)
Basophils Relative: 2 %
Eosinophils Absolute: 0 10*3/uL (ref 0.0–0.5)
Eosinophils Relative: 1 %
HCT: 33.6 % — ABNORMAL LOW (ref 36.0–46.0)
Hemoglobin: 11.4 g/dL — ABNORMAL LOW (ref 12.0–15.0)
Immature Granulocytes: 7 %
Lymphocytes Relative: 7 %
Lymphs Abs: 0.3 10*3/uL — ABNORMAL LOW (ref 0.7–4.0)
MCH: 30.3 pg (ref 26.0–34.0)
MCHC: 33.9 g/dL (ref 30.0–36.0)
MCV: 89.4 fL (ref 80.0–100.0)
Monocytes Absolute: 0.6 10*3/uL (ref 0.1–1.0)
Monocytes Relative: 13 %
Neutro Abs: 3.2 10*3/uL (ref 1.7–7.7)
Neutrophils Relative %: 70 %
Platelets: 47 10*3/uL — ABNORMAL LOW (ref 150–400)
RBC: 3.76 MIL/uL — ABNORMAL LOW (ref 3.87–5.11)
RDW: 15 % (ref 11.5–15.5)
WBC: 4.6 10*3/uL (ref 4.0–10.5)
nRBC: 0 % (ref 0.0–0.2)

## 2019-10-05 LAB — COMPREHENSIVE METABOLIC PANEL
ALT: 27 U/L (ref 0–44)
AST: 14 U/L — ABNORMAL LOW (ref 15–41)
Albumin: 2.8 g/dL — ABNORMAL LOW (ref 3.5–5.0)
Alkaline Phosphatase: 73 U/L (ref 38–126)
Anion gap: 10 (ref 5–15)
BUN: 9 mg/dL (ref 6–20)
CO2: 21 mmol/L — ABNORMAL LOW (ref 22–32)
Calcium: 8.9 mg/dL (ref 8.9–10.3)
Chloride: 103 mmol/L (ref 98–111)
Creatinine, Ser: 0.77 mg/dL (ref 0.44–1.00)
GFR calc Af Amer: >60 mL/min (ref >60)
GFR calc non Af Amer: >60 mL/min (ref >60)
Glucose, Bld: 191 mg/dL — ABNORMAL HIGH (ref 70–99)
Potassium: 3.8 mmol/L (ref 3.5–5.1)
Sodium: 134 mmol/L — ABNORMAL LOW (ref 135–145)
Total Bilirubin: 1.4 mg/dL — ABNORMAL HIGH (ref 0.3–1.2)
Total Protein: 5 g/dL — ABNORMAL LOW (ref 6.5–8.1)

## 2019-10-05 LAB — C-REACTIVE PROTEIN: CRP: 29.8 mg/dL — ABNORMAL HIGH (ref <1.0)

## 2019-10-05 LAB — D-DIMER, QUANTITATIVE: D-Dimer, Quant: 1.74 ug/mL-FEU — ABNORMAL HIGH (ref 0.00–0.50)

## 2019-10-05 LAB — FERRITIN: Ferritin: 882 ng/mL — ABNORMAL HIGH (ref 11–307)

## 2019-10-05 LAB — HEPARIN LEVEL (UNFRACTIONATED): Heparin Unfractionated: 0.48 IU/mL (ref 0.30–0.70)

## 2019-10-05 LAB — MAGNESIUM: Magnesium: 1.9 mg/dL (ref 1.7–2.4)

## 2019-10-05 MED ORDER — METOPROLOL TARTRATE 25 MG PO TABS
25.0000 mg | ORAL_TABLET | Freq: Two times a day (BID) | ORAL | Status: DC
  Administered 2019-10-05 – 2019-10-19 (×29): 25 mg via ORAL
  Filled 2019-10-05 (×29): qty 1

## 2019-10-05 MED ORDER — SODIUM CHLORIDE 0.9 % IV BOLUS
500.0000 mL | Freq: Once | INTRAVENOUS | Status: AC
  Administered 2019-10-05: 500 mL via INTRAVENOUS

## 2019-10-05 NOTE — Progress Notes (Signed)
Updated pt's husband about current status and treatment plan.  Chesley Mires, MD [facility] 10/05/2019, 4:34 PM

## 2019-10-05 NOTE — Progress Notes (Signed)
ANTICOAGULATION CONSULT NOTE  Pharmacy Consult for heparin Indication: pulmonary embolus   Patient Measurements: Height: 5\' 1"  (154.9 cm) Weight: 211 lb (95.7 kg) IBW/kg (Calculated) : 47.8 Heparin Dosing Weight: 71 kg  Vital Signs: Temp: 99.8 F (37.7 C) (12/25 0000) Temp Source: Oral (12/25 0000) Pulse Rate: 129 (12/25 0630)  Labs: Recent Labs    10/03/19 0601 10/03/19 2330 10/04/19 0454 10/04/19 0455 10/05/19 0230 10/05/19 0231  HGB 11.2*  --  11.0*  --  11.4*  --   HCT 33.6*  --  33.0*  --  33.6*  --   PLT 31*  --  39*  --  47*  --   HEPARINUNFRC  --  0.50  --  0.50  --  0.48  CREATININE 0.72  --  0.90  --  0.77  --       Assessment: Pharmacy consulted to dose/monitor heparin in this 48 year old female with PMH significant for breast cancer on chemotherapy with curative intent. CT angio on 12/18 revealed acute bilateral PE, acute occlusive SVC thrombosis.  Heparin level therapeutic this AM, CBC stable.  Goal of Therapy:  Heparin level 0.3-0.7 units/ml Monitor platelets by anticoagulation protocol: Yes   Plan:   Continue heparin gtt at 1900 units/hr Daily heparin level, CBC, s/s bleeding  Bertis Ruddy, PharmD Clinical Pharmacist Please check AMION for all [place] numbers 10/05/2019 7:56 AM

## 2019-10-05 NOTE — Progress Notes (Signed)
[facility] her face swelling is improving and arm swelling mildly improving.   Objective (!) 117/58 (!) 136 100.2 F (37.9 C) (Oral) (!) 35 96%  Intake/Output Summary (Last 24 hours) at 10/05/2019 1055 Last data filed at 10/05/2019 1000 Gross per 24 hour  Intake 976.25 ml  Output 1390 ml  Net -413.75 ml    Bilateral upper extremity edema Pericardial drain 380 output  Laboratory Lab Results: Recent Labs    10/04/19 0454 10/05/19 0230  WBC 2.4* 4.6  HGB 11.0* 11.4*  HCT 33.0* 33.6*  PLT 39* 47*   BMET Recent Labs    10/04/19 0454 10/05/19 0230  NA 133* 134*  K 3.6 3.8  CL 102 103  CO2 20* 21*  GLUCOSE 185* 191*  BUN 7 9  CREATININE 0.90 0.77  CALCIUM 9.1 8.9    COAG Lab Results  Component Value Date   INR 1.0 09/29/2019   INR 1.0 08/29/2019   No results found for: PTT  Assessment/Planning:  Continue pericardial drain for now with output 380 mL over the last 24 hours.  Discussed with CT surgery and will plan to keep until output is less than 50-100.  Continue heparin drip and ultimately will convert to DOAC once drain removed.  Facial swelling improved.  Arm swelling slowly improving.  Marty Heck 10/05/2019 10:55 AM --

## 2019-10-05 NOTE — Progress Notes (Signed)
NAME:  Veronica [name], MRN:  [id number], DOB:  June 26, 1971, LOS: 7 ADMISSION DATE:  09/28/2019, CONSULTATION DATE:  12/22 REFERRING MD:  Dr. Louanne Belton , CHIEF COMPLAINT:  PE   Brief History   48 yo female admitted with PE and SVC syndrome with hx of breast cancer.  Had thrombectomy on [id number] complicated by hemopericardium and tamponade requiring subxyphoid pericardial window.  Incidentally found to be positive for COVID 19.  Past Medical History  Breast cancer ER positive, HTN  Significant Hospital Events   12/18 admit. 12/22 to OR for thrombectomy which was complicated by large hemopericardium with tamponade requiring subxyphoid pericardial window. 12/23 extubated 12/25 add lopressor for tachycardia  Consults:  Vascular, TCTS, Oncology  Procedures:  ETT 12/22 > 12/23 L radial art line 12/22 >  Right femoral CVL >> 12/25  Significant Diagnostic Tests:  CTA chest 12/18 > bilateral lower lobe PE, SVC occlusion, probable RLL infarct, small b/l effusions. CT head 12/18 > no acute findings. Echo 12/20 > EF 60-65%, small pericardial effusion. Echo 12/22 (post thrombectomy) > significantly reduced clot burden in the SVC with improved flow on the venogram.  Large expanding circumferential pericardial effusion with evidence of tamponade. Echo 12/22 (post pericardial window) > resolution of pericardial effusion without evidence of tamponade, LVEF > 65%, small clot burden in SVC.  Micro Data:  SARS CoV2 12/18 > positive.  Antimicrobials/COVID19 treatment  Remdesivir 12/21 >> 12/25  Interim history/subjective:  Has intermittent cough.  Had some blood streaking in sputum.  Denies chest pain.  Breathing okay.  Denies palpitations.  Objective   Blood pressure (!) 117/58, pulse (!) 133, temperature 100.2 F (37.9 C), temperature source Oral, resp. rate (!) 30, height 5\' 1"  (1.549 m), weight 95.7 kg, SpO2 95 %.        Intake/Output Summary (Last 24 hours) at 10/05/2019 0902 Last data  filed at 10/05/2019 0800 Gross per 24 hour  Intake 1058.31 ml  Output 1690 ml  Net -631.69 ml   Filed Weights   09/28/19 1659  Weight: 95.7 kg    Examination:  General - alert Eyes - pupils reactive ENT - no sinus tenderness, no stridor Cardiac - regular, tachycardic Chest - equal breath sounds b/l, no wheezing or rales Abdomen - soft, non tender, + bowel sounds Extremities - no cyanosis, clubbing, or edema Skin - no rashes Neuro - normal strength, moves extremities, follows commands Psych - normal mood and behavior  Resolved Hospital Problem list   Acute respiratory failure with hypoxia  Assessment/plan:   Acute PE with acute SVC syndrome s/p mechanical thrombectomy complicated by hemopericardium with tamponade s/p window. - continue heparin gtt - chest drain per surgery - d/c arterial line 12/25  Tachycardia. Hx of HTN. - add lopressor 12/15 - hold outpt benicar, lasix  COVID 19 infection with initial concern for COVID 19 pneumonia. - complete course of remdesivir 12/25  Blood streaking in sputum noted 12/25. - monitor clinically - f/u CXR 12/26 - if progresses, then will need to hold heparin gtt  Chemotherapy induced pancytopenia. ER positive breast cancer. - f/u CBC - oncology following  Hx of GERD. - continue protonix   Best practice:  Diet: regular diet DVT prophylaxis: heparin gtt GI prophylaxis: PPI Mobility: OOB to chair Code Status: Full Disposition: ICU   Labs    CMP Latest Ref Rng & Units 10/05/2019 10/04/2019 10/03/2019  Glucose 70 - 99 mg/dL 191(H) 185(H) 116(H)  BUN 6 - 20 mg/dL 9 7 <5(L)  Creatinine  0.44 - 1.00 mg/dL 0.77 0.90 0.72  Sodium 135 - 145 mmol/L 134(L) 133(L) 138  Potassium 3.5 - 5.1 mmol/L 3.8 3.6 3.7  Chloride 98 - 111 mmol/L 103 102 106  CO2 22 - 32 mmol/L 21(L) 20(L) 22  Calcium 8.9 - 10.3 mg/dL 8.9 9.1 10.3  Total Protein 6.5 - 8.1 g/dL 5.0(L) 5.0(L) 5.8(L)  Total Bilirubin 0.3 - 1.2 mg/dL 1.4(H) 1.4(H) 1.5(H)   Alkaline Phos 38 - 126 U/L 73 62 68  AST 15 - 41 U/L 14(L) 14(L) 33  ALT 0 - 44 U/L 27 38 61(H)    CBC Latest Ref Rng & Units 10/05/2019 10/04/2019 10/03/2019  WBC 4.0 - 10.5 K/uL 4.6 2.4(L) 1.0(LL)  Hemoglobin 12.0 - 15.0 g/dL 11.4(L) 11.0(L) 11.2(L)  Hematocrit 36.0 - 46.0 % 33.6(L) 33.0(L) 33.6(L)  Platelets 150 - 400 K/uL 47(L) 39(L) 31(L)    ABG    Component Value Date/Time   PHART 7.338 (L) 10/02/2019 2017   PCO2ART 40.7 10/02/2019 2017   PO2ART 353.0 (H) 10/02/2019 2017   HCO3 21.9 10/02/2019 2017   TCO2 23 10/02/2019 2017   ACIDBASEDEF 4.0 (H) 10/02/2019 2017   O2SAT 100.0 10/02/2019 2017    Chesley Mires, MD [facility] 10/05/2019, 9:02 AM

## 2019-10-06 ENCOUNTER — Inpatient Hospital Stay (HOSPITAL_COMMUNITY): Payer: 59

## 2019-10-06 LAB — CBC WITH DIFFERENTIAL/PLATELET
Abs Immature Granulocytes: 0.37 10*3/uL — ABNORMAL HIGH (ref 0.00–0.07)
Basophils Absolute: 0 10*3/uL (ref 0.0–0.1)
Basophils Relative: 0 %
Eosinophils Absolute: 0.1 10*3/uL (ref 0.0–0.5)
Eosinophils Relative: 1 %
HCT: 32.9 % — ABNORMAL LOW (ref 36.0–46.0)
Hemoglobin: 10.9 g/dL — ABNORMAL LOW (ref 12.0–15.0)
Immature Granulocytes: 5 %
Lymphocytes Relative: 6 %
Lymphs Abs: 0.5 10*3/uL — ABNORMAL LOW (ref 0.7–4.0)
MCH: 29.7 pg (ref 26.0–34.0)
MCHC: 33.1 g/dL (ref 30.0–36.0)
MCV: 89.6 fL (ref 80.0–100.0)
Monocytes Absolute: 0.7 10*3/uL (ref 0.1–1.0)
Monocytes Relative: 9 %
Neutro Abs: 5.8 10*3/uL (ref 1.7–7.7)
Neutrophils Relative %: 79 %
Platelets: 89 10*3/uL — ABNORMAL LOW (ref 150–400)
RBC: 3.67 MIL/uL — ABNORMAL LOW (ref 3.87–5.11)
RDW: 14.7 % (ref 11.5–15.5)
WBC: 7.4 10*3/uL (ref 4.0–10.5)
nRBC: 0 % (ref 0.0–0.2)

## 2019-10-06 LAB — APTT: aPTT: 115 seconds — ABNORMAL HIGH (ref 24–36)

## 2019-10-06 LAB — PROTIME-INR
INR: 1.1 (ref 0.8–1.2)
Prothrombin Time: 14 seconds (ref 11.4–15.2)

## 2019-10-06 LAB — HEPARIN LEVEL (UNFRACTIONATED): Heparin Unfractionated: 0.4 IU/mL (ref 0.30–0.70)

## 2019-10-06 IMAGING — DX DG CHEST 1V PORT
1 series · 1 of 1 positions shown · non-contrast
Comparison: [DATE]

CLINICAL DATA: Hemopericardium.  [3K] positive.

EXAM:
PORTABLE CHEST 1 VIEW

[chest ap]
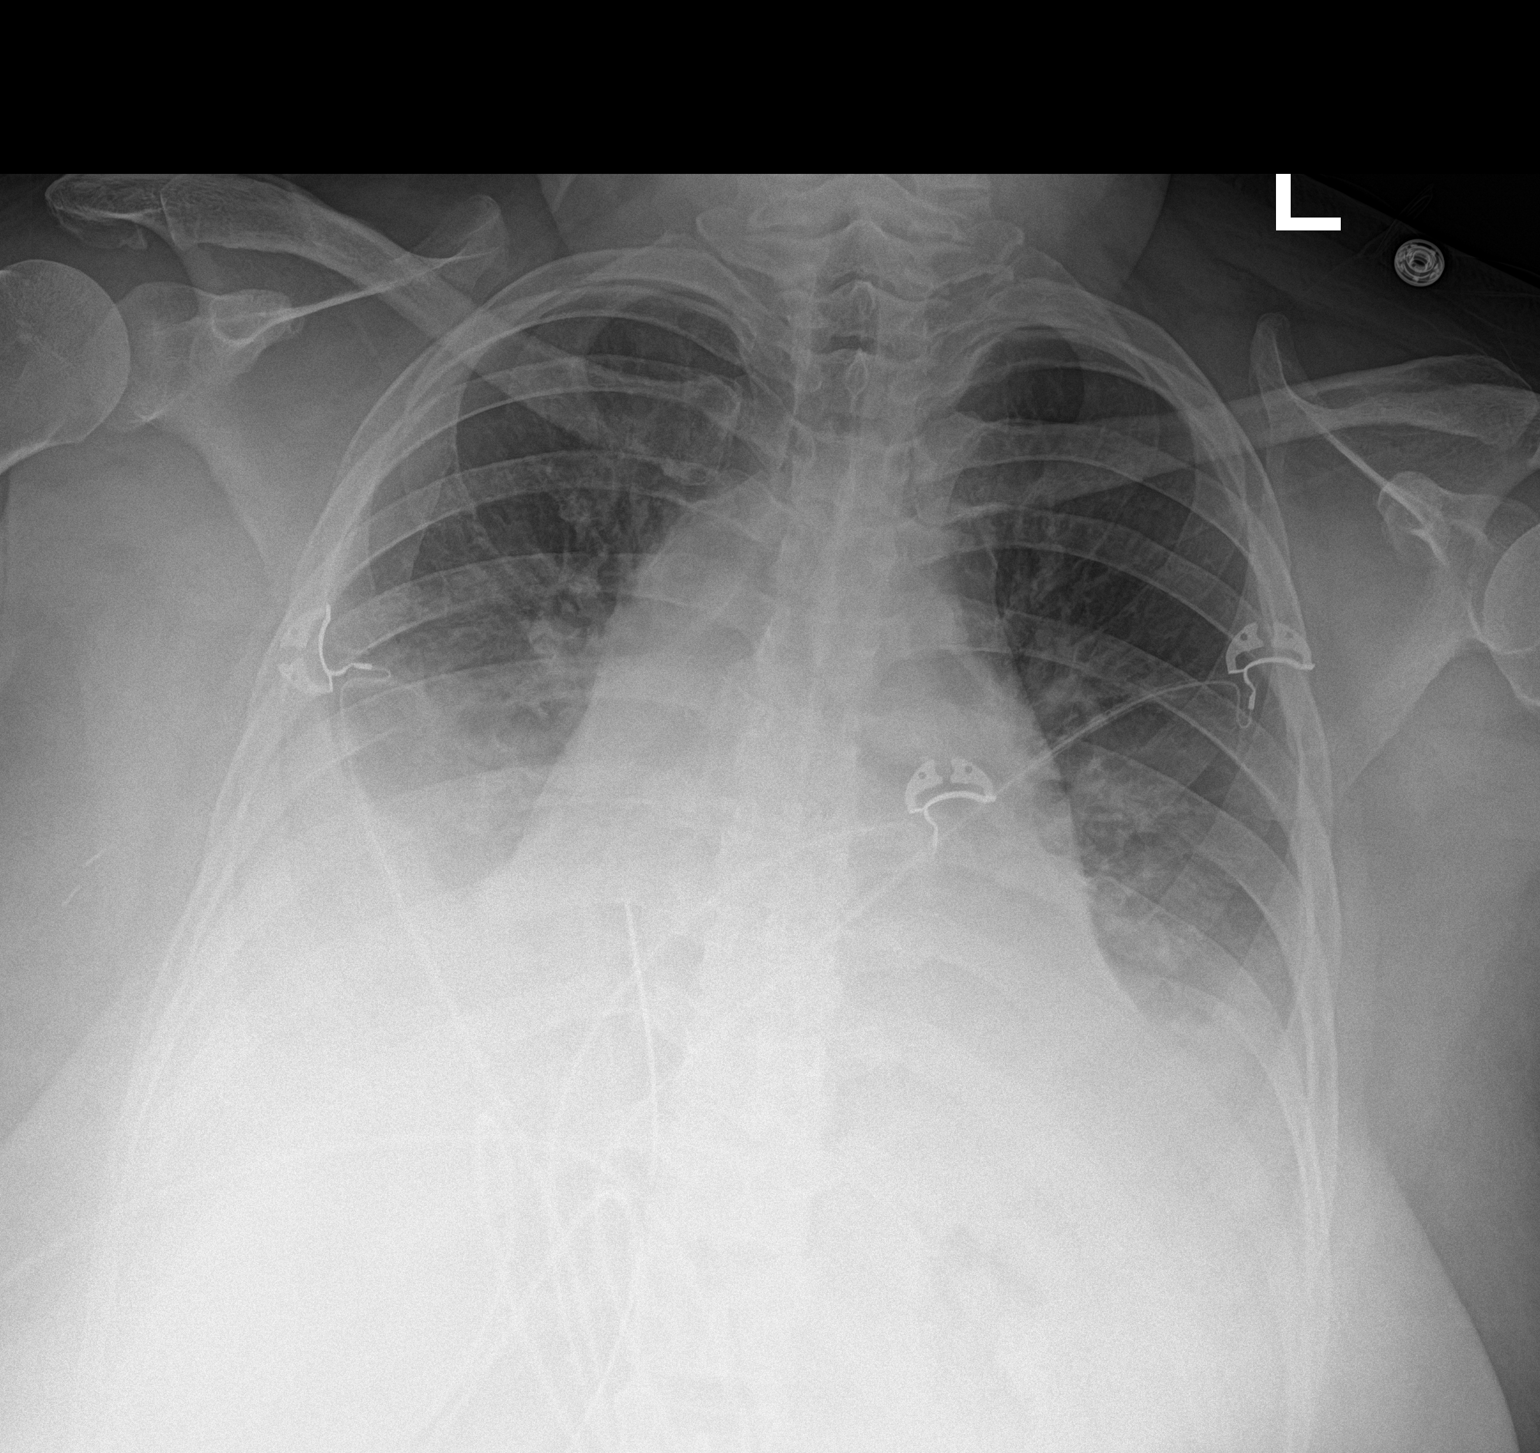

[1 of 1 positions shown; findings below may reference images not displayed]

FINDINGS: Findings suggesting pericardial drain over the medial right base
unchanged. Worsening bilateral moderate size pleural effusions
likely with associated basilar atelectasis. Prominence of the
cardiac silhouette with possible slight worsening. Remainder of the
exam is unchanged.
IMPRESSION: 1. Worsening bilateral moderate size pleural effusions likely with
associated basilar atelectasis.

2. Possible slight worsening prominence of the cardiac silhouette
with pericardial drainage catheter unchanged.

## 2019-10-06 MED ORDER — SODIUM CHLORIDE 0.9% FLUSH
10.0000 mL | Freq: Two times a day (BID) | INTRAVENOUS | Status: DC
  Administered 2019-10-07 – 2019-10-19 (×18): 10 mL

## 2019-10-06 MED ORDER — FUROSEMIDE 20 MG PO TABS
10.0000 mg | ORAL_TABLET | Freq: Every day | ORAL | Status: DC
  Administered 2019-10-06 – 2019-10-19 (×14): 10 mg via ORAL
  Filled 2019-10-06 (×14): qty 1

## 2019-10-06 MED ORDER — "THROMBI-PAD 3""X3"" EX PADS"
1.0000 | MEDICATED_PAD | Freq: Once | CUTANEOUS | Status: AC
  Administered 2019-10-06: 1 via TOPICAL
  Filled 2019-10-06: qty 1

## 2019-10-06 MED ORDER — SODIUM CHLORIDE 0.9% FLUSH
10.0000 mL | INTRAVENOUS | Status: DC | PRN
  Administered 2019-10-10: 10 mL

## 2019-10-06 NOTE — Progress Notes (Addendum)
ANTICOAGULATION CONSULT NOTE  Pharmacy Consult for heparin Indication: pulmonary embolus   Patient Measurements: Height: 5\' 1"  (154.9 cm) Weight: 211 lb (95.7 kg) IBW/kg (Calculated) : 47.8 Heparin Dosing Weight: 71 kg  Vital Signs: Temp: 98.2 F (36.8 C) (12/26 0742) Temp Source: Oral (12/26 0742) BP: 135/94 (12/26 0917) Pulse Rate: 113 (12/26 0917)  Labs: Recent Labs    10/04/19 0454 10/04/19 0455 10/05/19 0230 10/05/19 0231 10/06/19 1117  HGB 11.0*  --  11.4*  --   --   HCT 33.0*  --  33.6*  --   --   PLT 39*  --  47*  --   --   LABPROT  --   --   --   --  14.0  INR  --   --   --   --  1.1  HEPARINUNFRC  --  0.50  --  0.48 0.40  CREATININE 0.90  --  0.77  --   --       Assessment: Pharmacy consulted to dose/monitor heparin in this 48 year old female with PMH significant for breast cancer on chemotherapy with curative intent. CT angio on 12/18 revealed acute bilateral PE, acute occlusive SVC thrombosis.  Heparin level therapeutic this AM, CBC stable. Some bleeding in sputum overnight but now stable  Goal of Therapy:  Heparin level 0.3-0.7 units/ml Monitor platelets by anticoagulation protocol: Yes   Plan:   Continue heparin gtt at 1900 units/hr Daily heparin level, CBC, s/s bleeding  Barth Kirks, PharmD, BCPS, BCCCP Clinical Pharmacist [phone]  Please check AMION for all [place] numbers  10/06/2019 12:04 PM

## 2019-10-06 NOTE — Progress Notes (Addendum)
0100 - Patient pivoted to use BSC. On completion of using BSC, blood noted dripping on the floor. Blood draining from insertion site. Reinforced dressing and paged Vascular Surgery as it appeared they are managing pericardial drain with CTS. Vital signs stable, no obvious distress. Pt has no complaints at this time.  Notified by Vascular to reinforce or redress site as needed. Plan is to pull tube after output is reduced to 50-135mL. Will continue to monitor.  0430 - Dried blood again noted coming from insertion site. Serosanguineous. Attempted to redress site, but upon attempt to remove current dressing, pt stated it was painful. Will leave current dressing and continue to reinforce. Vital signs stable. Pt has no complaints at this time. Will continue to monitor.

## 2019-10-06 NOTE — Progress Notes (Signed)
NAME:  Veronica [name], MRN:  [id number], DOB:  11-Nov-1970, LOS: 8 ADMISSION DATE:  09/28/2019, CONSULTATION DATE:  12/22 REFERRING MD:  Dr. Louanne Belton , CHIEF COMPLAINT:  PE   Brief History   48 yo female admitted with PE and SVC syndrome with hx of breast cancer.  Had thrombectomy on [id number] complicated by hemopericardium and tamponade requiring subxyphoid pericardial window.  Incidentally found to be positive for COVID 19.  Past Medical History  Breast cancer ER positive, HTN  Significant Hospital Events   12/18 admit. 12/22 to OR for thrombectomy which was complicated by large hemopericardium with tamponade requiring subxyphoid pericardial window. 12/23 extubated 12/25 add lopressor for tachycardia  Consults:  Vascular, TCTS, Oncology  Procedures:  ETT 12/22 > 12/23 L radial art line 12/22 >  Right femoral CVL >> 12/25  Significant Diagnostic Tests:  CTA chest 12/18 > bilateral lower lobe PE, SVC occlusion, probable RLL infarct, small b/l effusions. CT head 12/18 > no acute findings. Echo 12/20 > EF 60-65%, small pericardial effusion. Echo 12/22 (post thrombectomy) > significantly reduced clot burden in the SVC with improved flow on the venogram.  Large expanding circumferential pericardial effusion with evidence of tamponade. Echo 12/22 (post pericardial window) > resolution of pericardial effusion without evidence of tamponade, LVEF > 65%, small clot burden in SVC.  Micro Data:  SARS CoV2 12/18 > positive.  Antimicrobials/COVID19 treatment  Remdesivir 12/21 >> 12/25  Interim history/subjective:  Breathing okay.  No further episodes of blood in sputum.  Had oozing from chest tube site last night >> better this AM.  Objective   Blood pressure (!) 135/94, pulse (!) 113, temperature 98.2 F (36.8 C), temperature source Oral, resp. rate 19, height 5\' 1"  (1.549 m), weight 95.7 kg, SpO2 94 %.        Intake/Output Summary (Last 24 hours) at 10/06/2019 0944 Last data filed at  10/06/2019 0800 Gross per 24 hour  Intake 1515.94 ml  Output 470 ml  Net 1045.94 ml   Filed Weights   09/28/19 1659  Weight: 95.7 kg    Examination:  General - alert Eyes - pupils reactive ENT - no sinus tenderness, no stridor Cardiac - regular, tachycardic Chest - faint crackles at bases b/l, chest tube dressing clean Abdomen - soft, non tender, + bowel sounds Extremities - no cyanosis, clubbing, or edema Skin - no rashes Neuro - normal strength, moves extremities, follows commands Psych - normal mood and behavior   CXR (reviewed by me) - b/l pleural effusions, atelectasis   Resolved Hospital Problem list   Acute respiratory failure with hypoxia  Assessment/plan:   Acute PE with acute SVC syndrome s/p mechanical thrombectomy complicated by hemopericardium with tamponade s/p window. - continue heparin gtt - drains per surgery  Tachycardia. Hx of HTN. - continue lopressor - hold outpt benicar  Atelectasis, b/l pleural effusions. - lasix 10 mg daily - Incentive spirometer  COVID 19 infection with initial concern for COVID 19 pneumonia. - completed course of remdesivir 12/25   Blood streaking in sputum noted 12/25. - improved 12/26 - monitor clinically  Chemotherapy induced pancytopenia. ER positive breast cancer. - f/u CBC - oncology following  Hx of GERD. - continue protonix   Best practice:  Diet: regular diet DVT prophylaxis: heparin gtt GI prophylaxis: PPI Mobility: OOB to chair Code Status: Full Disposition: ICU   Labs    CMP Latest Ref Rng & Units 10/05/2019 10/04/2019 10/03/2019  Glucose 70 - 99 mg/dL 191(H) 185(H) 116(H)  BUN 6 - 20 mg/dL 9 7 <5(L)  Creatinine 0.44 - 1.00 mg/dL 0.77 0.90 0.72  Sodium 135 - 145 mmol/L 134(L) 133(L) 138  Potassium 3.5 - 5.1 mmol/L 3.8 3.6 3.7  Chloride 98 - 111 mmol/L 103 102 106  CO2 22 - 32 mmol/L 21(L) 20(L) 22  Calcium 8.9 - 10.3 mg/dL 8.9 9.1 10.3  Total Protein 6.5 - 8.1 g/dL 5.0(L) 5.0(L)  5.8(L)  Total Bilirubin 0.3 - 1.2 mg/dL 1.4(H) 1.4(H) 1.5(H)  Alkaline Phos 38 - 126 U/L 73 62 68  AST 15 - 41 U/L 14(L) 14(L) 33  ALT 0 - 44 U/L 27 38 61(H)    CBC Latest Ref Rng & Units 10/05/2019 10/04/2019 10/03/2019  WBC 4.0 - 10.5 K/uL 4.6 2.4(L) 1.0(LL)  Hemoglobin 12.0 - 15.0 g/dL 11.4(L) 11.0(L) 11.2(L)  Hematocrit 36.0 - 46.0 % 33.6(L) 33.0(L) 33.6(L)  Platelets 150 - 400 K/uL 47(L) 39(L) 31(L)    ABG    Component Value Date/Time   PHART 7.338 (L) 10/02/2019 2017   PCO2ART 40.7 10/02/2019 2017   PO2ART 353.0 (H) 10/02/2019 2017   HCO3 21.9 10/02/2019 2017   TCO2 23 10/02/2019 2017   ACIDBASEDEF 4.0 (H) 10/02/2019 2017   O2SAT 100.0 10/02/2019 2017    Chesley Mires, MD [facility] 10/06/2019, 9:44 AM

## 2019-10-06 NOTE — Progress Notes (Signed)
Pt having some bleeding from the top of the drain site now as well. Vascular paged and instructed nursing to just change or reinforce dressing as needed. I also told him the site was red looking.

## 2019-10-06 NOTE — Progress Notes (Signed)
eLink Physician-Brief Progress Note Patient Name: Veronica [name] DOB: 12/08/1970 MRN: [id number]   Date of Service  10/06/2019  HPI/Events of Note  Pt with oozing from around her chest tube site, she's on Heparin gtt  eICU Interventions  Thrombin pad ordered to be placed around the bleeding chest tube site, PTT, PT, INR ordered stat.        Kerry Kass Dina [place] 10/06/2019, 5:29 AM

## 2019-10-07 LAB — HEPARIN LEVEL (UNFRACTIONATED): Heparin Unfractionated: 0.48 IU/mL (ref 0.30–0.70)

## 2019-10-07 MED ORDER — "THROMBI-PAD 3""X3"" EX PADS"
1.0000 | MEDICATED_PAD | Freq: Once | CUTANEOUS | Status: AC
  Administered 2019-10-07: 1 via TOPICAL
  Filled 2019-10-07: qty 1

## 2019-10-07 MED ORDER — CEPHALEXIN 500 MG PO CAPS
500.0000 mg | ORAL_CAPSULE | Freq: Three times a day (TID) | ORAL | Status: AC
  Administered 2019-10-07 – 2019-10-14 (×22): 500 mg via ORAL
  Filled 2019-10-07 (×29): qty 1

## 2019-10-07 MED ORDER — GUAIFENESIN 100 MG/5ML PO SOLN
5.0000 mL | ORAL | Status: DC | PRN
  Administered 2019-10-07 – 2019-10-08 (×4): 100 mg via ORAL
  Filled 2019-10-07 (×5): qty 10

## 2019-10-07 NOTE — Progress Notes (Signed)
Pt c/o persistent cough and requesting cough medication, notified ELink.

## 2019-10-07 NOTE — Progress Notes (Signed)
NAME:  Veronica [name], MRN:  [id number], DOB:  19-Nov-1970, LOS: 9 ADMISSION DATE:  09/28/2019, CONSULTATION DATE:  12/22 REFERRING MD:  Dr. Louanne Belton , CHIEF COMPLAINT:  PE   Brief History   48 yo female admitted with PE and SVC syndrome with hx of breast cancer.  Had thrombectomy on [id number] complicated by hemopericardium and tamponade requiring subxyphoid pericardial window.  Incidentally found to be positive for COVID 19.  Past Medical History  Breast cancer ER positive, HTN  Significant Hospital Events   12/18 admit. 12/22 to OR for thrombectomy which was complicated by large hemopericardium with tamponade requiring subxyphoid pericardial window. 12/23 extubated 12/25 add lopressor for tachycardia  Consults:  Vascular, TCTS, Oncology  Procedures:  ETT 12/22 > 12/23 L radial art line 12/22 >  Right femoral CVL >> 12/25  Significant Diagnostic Tests:  CTA chest 12/18 > bilateral lower lobe PE, SVC occlusion, probable RLL infarct, small b/l effusions. CT head 12/18 > no acute findings. Echo 12/20 > EF 60-65%, small pericardial effusion. Echo 12/22 (post thrombectomy) > significantly reduced clot burden in the SVC with improved flow on the venogram.  Large expanding circumferential pericardial effusion with evidence of tamponade. Echo 12/22 (post pericardial window) > resolution of pericardial effusion without evidence of tamponade, LVEF > 65%, small clot burden in SVC.  Micro Data:  SARS CoV2 12/18 > positive.  Antimicrobials/COVID19 treatment  Remdesivir 12/21 >> 12/25  Interim history/subjective:  Has intermittent cough with clear sputum.  Breathing okay.  Denies chest pain.  Objective   Blood pressure (!) 111/57, pulse (!) 106, temperature 98.5 F (36.9 C), temperature source Oral, resp. rate (!) 22, height 5\' 1"  (1.549 m), weight 95.7 kg, SpO2 92 %.        Intake/Output Summary (Last 24 hours) at 10/07/2019 [id number] Last data filed at 10/07/2019 0720 Gross per 24 hour   Intake 418.05 ml  Output 1375 ml  Net -956.95 ml   Filed Weights   09/28/19 1659  Weight: 95.7 kg    Examination:  General - alert Eyes - pupils reactive ENT - no sinus tenderness, no stridor Cardiac - regular rate/rhythm, no murmur Chest - equal breath sounds b/l, no wheezing or rales, chest drain in place Abdomen - soft, non tender, + bowel sounds Extremities - no cyanosis, clubbing, or edema Skin - no rashes Neuro - normal strength, moves extremities, follows commands Psych - normal mood and behavior   Resolved Hospital Problem list   Acute respiratory failure with hypoxia  Assessment/plan:   Acute PE with acute SVC syndrome s/p mechanical thrombectomy complicated by hemopericardium with tamponade s/p window. - continue heparin gtt - drain outpt appears to be decreasing; hopefully can d/c soon >> defer to cardiothoracic surgery  Sinus tachycardia. Hx of HTN. - continue lopressor - hold outpt benicar  Atelectasis, b/l pleural effusions. - continue lasix - Incentive spirometer  COVID 19 infection with initial concern for COVID 19 pneumonia. - completed course of remdesivir 12/25   Chemotherapy induced pancytopenia. ER positive breast cancer. - f/u CBC - oncology following  Hx of GERD. - continue protonix   Best practice:  Diet: regular diet DVT prophylaxis: heparin gtt GI prophylaxis: PPI Mobility: OOB to chair Code Status: Full Disposition: ICU > can transfer out of ICU once she has chest tube d/c'ed  Labs    CMP Latest Ref Rng & Units 10/05/2019 10/04/2019 10/03/2019  Glucose 70 - 99 mg/dL 191(H) 185(H) 116(H)  BUN 6 - 20 mg/dL 9  7 <5(L)  Creatinine 0.44 - 1.00 mg/dL 0.77 0.90 0.72  Sodium 135 - 145 mmol/L 134(L) 133(L) 138  Potassium 3.5 - 5.1 mmol/L 3.8 3.6 3.7  Chloride 98 - 111 mmol/L 103 102 106  CO2 22 - 32 mmol/L 21(L) 20(L) 22  Calcium 8.9 - 10.3 mg/dL 8.9 9.1 10.3  Total Protein 6.5 - 8.1 g/dL 5.0(L) 5.0(L) 5.8(L)  Total Bilirubin  0.3 - 1.2 mg/dL 1.4(H) 1.4(H) 1.5(H)  Alkaline Phos 38 - 126 U/L 73 62 68  AST 15 - 41 U/L 14(L) 14(L) 33  ALT 0 - 44 U/L 27 38 61(H)    CBC Latest Ref Rng & Units 10/06/2019 10/05/2019 10/04/2019  WBC 4.0 - 10.5 K/uL 7.4 4.6 2.4(L)  Hemoglobin 12.0 - 15.0 g/dL 10.9(L) 11.4(L) 11.0(L)  Hematocrit 36.0 - 46.0 % 32.9(L) 33.6(L) 33.0(L)  Platelets 150 - 400 K/uL 89(L) 47(L) 39(L)    ABG    Component Value Date/Time   PHART 7.338 (L) 10/02/2019 2017   PCO2ART 40.7 10/02/2019 2017   PO2ART 353.0 (H) 10/02/2019 2017   HCO3 21.9 10/02/2019 2017   TCO2 23 10/02/2019 2017   ACIDBASEDEF 4.0 (H) 10/02/2019 2017   O2SAT 100.0 10/02/2019 2017    Chesley Mires, MD [facility] 10/07/2019, 9:05 AM

## 2019-10-07 NOTE — Progress Notes (Signed)
ANTICOAGULATION CONSULT NOTE  Pharmacy Consult for heparin Indication: pulmonary embolus   Patient Measurements: Height: 5\' 1"  (154.9 cm) Weight: 211 lb (95.7 kg) IBW/kg (Calculated) : 47.8 Heparin Dosing Weight: 71 kg  Vital Signs: Temp: 97.6 F (36.4 C) (12/27 0816) Temp Source: Oral (12/27 0816) BP: 111/75 (12/27 0928) Pulse Rate: 122 (12/27 0928)  Labs: Recent Labs    10/05/19 0230 10/05/19 0231 10/06/19 1115 10/06/19 1117 10/07/19 0941  HGB 11.4*  --  10.9*  --   --   HCT 33.6*  --  32.9*  --   --   PLT 47*  --  89*  --   --   APTT  --   --   --  115*  --   LABPROT  --   --   --  14.0  --   INR  --   --   --  1.1  --   HEPARINUNFRC  --  0.48  --  0.40 0.48  CREATININE 0.77  --   --   --   --       Assessment: Pharmacy consulted to dose/monitor heparin in this 48 year old female with PMH significant for breast cancer on chemotherapy with curative intent. CT angio on 12/18 revealed acute bilateral PE, acute occlusive SVC thrombosis.  Heparin level therapeutic this AM, CBC stable. Some bleeding in sputum overnight but now stable  Goal of Therapy:  Heparin level 0.3-0.7 units/ml Monitor platelets by anticoagulation protocol: Yes   Plan:   Continue heparin gtt at 1900 units/hr Daily heparin level, CBC, s/s bleeding  Barth Kirks, PharmD, BCPS, BCCCP Clinical Pharmacist [phone]  Please check AMION for all [place] numbers  10/07/2019 10:20 AM

## 2019-10-07 NOTE — Progress Notes (Signed)
Appreciate CT surgery removing her pericardial drain.  Only 50 cc output yesterday.  Minimal today.  She will be started on Keflex for some cellulitis at her incision from her pericardial window.  Continue heparin drip.  Marty Heck, MD [facility] Office: [phone] Pager: Many

## 2019-10-07 NOTE — Progress Notes (Signed)
Called vascular to ask about chest tube removal since there was no drainage over night and we are at 20cc this morning. MD would like to keep ct in to monitor for the day, pt is  Concerned about chest tube and would like it removed, I explained to her MD would like to monitor but she would like him to come by. She is also worried about the redness and drainage at her incision site. I described it to MD over phone yesterday and he said he was not concerned. I told her that it looks unchanged from yesterday, but I will see if the doctor is available to come by and see her today since he did not come by yesterday.

## 2019-10-07 NOTE — Progress Notes (Addendum)
      [address]       [place] [address]             [phone]     50 cc drainage yesterday and only minimal today. Removed  drain per usual technique. She also has developed som cellulitis of her incision. Will start on Keflex.   ,John Giovanni, PA-C

## 2019-10-07 NOTE — Progress Notes (Signed)
eLink Physician-Brief Progress Note Patient Name: Veronica [name] DOB: [birth year] MRN: [id number]   Date of Service  10/07/2019  HPI/Events of Note  Request for cough medication  eICU Interventions  Robitussin ordered     Intervention Category Intermediate Interventions: Other:  Margaretmary Lombard 10/07/2019, 7:57 PM

## 2019-10-08 ENCOUNTER — Encounter: Payer: Self-pay | Admitting: *Deleted

## 2019-10-08 ENCOUNTER — Inpatient Hospital Stay (HOSPITAL_COMMUNITY): Payer: 59

## 2019-10-08 DIAGNOSIS — U071 COVID-19: Secondary | ICD-10-CM

## 2019-10-08 DIAGNOSIS — I313 Pericardial effusion (noninflammatory): Secondary | ICD-10-CM

## 2019-10-08 LAB — BASIC METABOLIC PANEL
Anion gap: 12 (ref 5–15)
BUN: <5 mg/dL — ABNORMAL LOW (ref 6–20)
CO2: 21 mmol/L — ABNORMAL LOW (ref 22–32)
Calcium: 8.4 mg/dL — ABNORMAL LOW (ref 8.9–10.3)
Chloride: 102 mmol/L (ref 98–111)
Creatinine, Ser: 0.74 mg/dL (ref 0.44–1.00)
GFR calc Af Amer: >60 mL/min (ref >60)
GFR calc non Af Amer: >60 mL/min (ref >60)
Glucose, Bld: 182 mg/dL — ABNORMAL HIGH (ref 70–99)
Potassium: 3.6 mmol/L (ref 3.5–5.1)
Sodium: 135 mmol/L (ref 135–145)

## 2019-10-08 LAB — CBC
HCT: 31.8 % — ABNORMAL LOW (ref 36.0–46.0)
Hemoglobin: 10.3 g/dL — ABNORMAL LOW (ref 12.0–15.0)
MCH: 29.7 pg (ref 26.0–34.0)
MCHC: 32.4 g/dL (ref 30.0–36.0)
MCV: 91.6 fL (ref 80.0–100.0)
Platelets: UNDETERMINED 10*3/uL (ref 150–400)
RBC: 3.47 MIL/uL — ABNORMAL LOW (ref 3.87–5.11)
RDW: 14.7 % (ref 11.5–15.5)
WBC: 13.1 10*3/uL — ABNORMAL HIGH (ref 4.0–10.5)
nRBC: 0 % (ref 0.0–0.2)

## 2019-10-08 LAB — ECHOCARDIOGRAM LIMITED
Height: 61 in
Weight: 3376 oz

## 2019-10-08 LAB — HEPARIN LEVEL (UNFRACTIONATED): Heparin Unfractionated: 0.47 IU/mL (ref 0.30–0.70)

## 2019-10-08 MED ORDER — PRO-STAT SUGAR FREE PO LIQD
30.0000 mL | Freq: Two times a day (BID) | ORAL | Status: DC
  Administered 2019-10-08 – 2019-10-19 (×15): 30 mL via ORAL
  Filled 2019-10-08 (×15): qty 30

## 2019-10-08 NOTE — Progress Notes (Signed)
6 Days Post-Op Procedure(s) (LRB): PERCUTANEOUS  SVC THROMBECTOMY USING INARI (N/A) Central VENOGRAM (N/A) Intravascular Ultrasound (N/A) Removal Port-A-Cath Insertion Central Line Adult Ultrasound Guidance For Vascular Access Pericardial Window (N/A) Subjective: Pericardial drain out yesterday after subxiphoid pericardial window.  History of breast cancer. Pathology and cytology of operative specimen still pending Chest x-ray shows right lower lobe atelectasis with small pleural effusion We will get follow-up chest x-ray tomorrow. Patient on high-dose heparin with history of hemorrhagic pericardial effusion drained 6 days ago.  Will need to repeat echocardiogram and reduce heparin level target to less than 0.4 Objective: Vital signs in last 24 hours: Temp:  [97.9 F (36.6 C)-98.7 F (37.1 C)] 97.9 F (36.6 C) (12/28 1135) Pulse Rate:  [100-134] 106 (12/28 1300) Cardiac Rhythm: Sinus tachycardia (12/28 1000) Resp:  [0-32] 0 (12/28 1300) BP: (105-194)/(48-165) 117/80 (12/28 1300) SpO2:  [92 %-100 %] 98 % (12/28 1300)  Hemodynamic parameters for last 24 hours:    Intake/Output from previous day: 12/27 0701 - 12/28 0700 In: 436.7 [I.V.:436.7] Out: 2870 [Urine:2850; Chest Tube:20] Intake/Output this shift: Total I/O In: 131.6 [I.V.:131.6] Out: 200 [Urine:200]  Vital signs stable Heart rate sinus tach at 108 Diminished breath sounds at right base Surgical dressing over incision clean and dry  Lab Results: Recent Labs    10/06/19 1115 10/08/19 0924  WBC 7.4 13.1*  HGB 10.9* 10.3*  HCT 32.9* 31.8*  PLT 89* PLATELET CLUMPS NOTED ON SMEAR, UNABLE TO ESTIMATE   BMET:  Recent Labs    10/08/19 0924  NA 135  K 3.6  CL 102  CO2 21*  GLUCOSE 182*  BUN <5*  CREATININE 0.74  CALCIUM 8.4*    PT/INR:  Recent Labs    10/06/19 1117  LABPROT 14.0  INR 1.1   ABG    Component Value Date/Time   PHART 7.338 (L) 10/02/2019 2017   HCO3 21.9 10/02/2019 2017   TCO2 23  10/02/2019 2017   ACIDBASEDEF 4.0 (H) 10/02/2019 2017   O2SAT 100.0 10/02/2019 2017   CBG (last 3)  No results for input(s): GLUCAP in the last 72 hours.  Assessment/Plan: S/P Procedure(s) (LRB): PERCUTANEOUS  SVC THROMBECTOMY USING INARI (N/A) Central VENOGRAM (N/A) Intravascular Ultrasound (N/A) Removal Port-A-Cath Insertion Central Line Adult Ultrasound Guidance For Vascular Access Pericardial Window (N/A) Status post subxiphoid pericardial window for hemorrhagic pericardial effusion and tamponade 12-22.  Patient at risk for increased bleeding with high-dose heparin. Will get follow-up limited echocardiogram, reduce heparin level goal to 0.3-0.4   LOS: 10 days    Veronica [name] 10/08/2019

## 2019-10-08 NOTE — Progress Notes (Signed)
Nutrition Follow-up  RD working remotely.  DOCUMENTATION CODES:   Obesity unspecified  INTERVENTION:   - Ensure Enlive po TID, each supplement provides 350 kcal and 20 grams of protein (vanilla flavor)  - Pro-stat 30 ml po BID, each supplement provides 100 kcal and 15 grams of protein  - Magic cup BID with meals, each supplement provides 290 kcal and 9 grams of protein  - Encourage adequate PO intake  NUTRITION DIAGNOSIS:   Increased nutrient needs related to chronic illness, acute illness (breast cancer undergoing chemotherapy, COVID-19) as evidenced by estimated needs.  Ongoing, being addressed via oral nutrition supplements  GOAL:   Patient will meet greater than or equal to 90% of their needs  Progressing  MONITOR:   PO intake, Supplement acceptance, Labs, Weight trends, I & O's  REASON FOR ASSESSMENT:   Malnutrition Screening Tool    ASSESSMENT:   48 year old female who presented to the ED on 12/18 for further management of newly diagnosed PE and occlusion of SVC. PMH of breast cancer currently undergoing chemotherapy. Pt also found to be COVID-19 positive.  12/22 - s/p SVC mechanical thrombectomy, s/p subxiphoid pericardial window for drainage of hemopericardium 12/23 - extubated 12/27 - pericardial drain removed  Pt accepting ~50% of Ensure Enlive supplements per MAR.  No new weights since admission.  Given variable PO intake and variable acceptance of Ensure Enlive, RD will order Pro-stat po BID as well as [employer] with meals.  Per RN edema assessment, pt with mild pitting generalized edema and mild to moderate pitting edema to BLE.  Meal Completion: 25-100% x 4 meals (several meals missing, averaging 75%)  Medications reviewed and include: Ensure Enlive TID, Lasix, protonix, heparin  Labs reviewed.  UOP: 2850 ml x 24 hours I/O's: +1.7 L since admit  NUTRITION - FOCUSED PHYSICAL EXAM:  Unable to complete at this time. RD working  remotely.  Diet Order:   Diet Order            Diet regular Room service appropriate? Yes; Fluid consistency: Thin  Diet effective now              EDUCATION NEEDS:   Education needs have been addressed  Skin:  Skin Assessment: Skin Integrity Issues: Incisions: right groin, chest  Last BM:  10/06/19  Height:   Ht Readings from Last 1 Encounters:  09/28/19 5\' 1"  (1.549 m)    Weight:   Wt Readings from Last 1 Encounters:  09/28/19 95.7 kg    Ideal Body Weight:  47.7 kg  BMI:  Body mass index is 39.87 kg/m.  Estimated Nutritional Needs:   Kcal:  2000-2200  Protein:  100-115 grams  Fluid:  >/= 2.0 L    Gaynell Face, MS, RD, LDN Inpatient Clinical Dietitian Pager: [phone] Weekend/After Hours: [phone]

## 2019-10-08 NOTE — Progress Notes (Signed)
NAME:  Veronica [name], MRN:  [id number], DOB:  [birth year], LOS: 51 ADMISSION DATE:  09/28/2019, CONSULTATION DATE:  12/22 REFERRING MD:  Dr. Louanne Belton , CHIEF COMPLAINT:  PE   Brief History   48 yo female admitted with PE and SVC syndrome with hx of breast cancer.  Had thrombectomy on [id number] complicated by hemopericardium and tamponade requiring subxyphoid pericardial window.  Incidentally found to be positive for COVID 19.  Past Medical History  Breast cancer ER positive, HTN  Significant Hospital Events   12/18 admit. 12/22 to OR for thrombectomy which was complicated by large hemopericardium with tamponade requiring subxyphoid pericardial window. 12/23 extubated 12/25 add lopressor for tachycardia 12/27 pericardial drain removed.   Consults:  Vascular, TCTS, Oncology  Procedures:  ETT 12/22 > 12/23 L radial art line 12/22 >  Right femoral CVL >> 12/25  Significant Diagnostic Tests:  CTA chest 12/18 > bilateral lower lobe PE, SVC occlusion, probable RLL infarct, small b/l effusions. CT head 12/18 > no acute findings. Echo 12/20 > EF 60-65%, small pericardial effusion. Echo 12/22 (post thrombectomy) > significantly reduced clot burden in the SVC with improved flow on the venogram.  Large expanding circumferential pericardial effusion with evidence of tamponade. Echo 12/22 (post pericardial window) > resolution of pericardial effusion without evidence of tamponade, LVEF > 65%, small clot burden in SVC.  Micro Data:  SARS CoV2 12/18 > positive.  Antimicrobials/COVID19 treatment  Remdesivir 12/21 >> 12/25  Interim history/subjective:  12/28: remained stable overnight. Ready for transfer to floor. PT consulted 12/27: Has intermittent cough with clear sputum.  Breathing okay.  Denies chest pain.  Objective   Blood pressure (!) 194/165, pulse (!) 124, temperature 98 F (36.7 C), temperature source Oral, resp. rate 11, height 5\' 1"  (1.549 m), weight 95.7 kg, SpO2 93 %.         Intake/Output Summary (Last 24 hours) at 10/08/2019 1050 Last data filed at 10/08/2019 1000 Gross per 24 hour  Intake 454.4 ml  Output 2375 ml  Net -1920.6 ml   Filed Weights   09/28/19 1659  Weight: 95.7 kg    Examination:  General - alert sitting up oob in chair in nad Eyes - pupils reactive ENT - no sinus tenderness, no stridor Cardiac - regular rate/rhythm, no murmur Chest - equal breath sounds b/l, no wheezing or rales Abdomen - soft, non tender, + bowel sounds Extremities - no cyanosis, clubbing, or edema Skin - no rashes Neuro - normal strength, moves extremities, follows commands Psych - normal mood and behavior   Resolved Hospital Problem list   Acute respiratory failure with hypoxia  Assessment/plan:   Acute PE with acute SVC syndrome s/p mechanical thrombectomy complicated by hemopericardium with tamponade s/p window. - continue heparin gtt -pericardial drain removed per CTS yesterday -remains on keflex for cellulitis as incision area.   Sinus tachycardia. Hx of HTN. - continue lopressor - hold outpt benicar  Atelectasis, b/l pleural effusions. - continue lasix - Incentive spirometer  COVID 19 infection with initial concern for COVID 19 pneumonia. - completed course of remdesivir 12/25   Chemotherapy induced pancytopenia. ER positive breast cancer. - f/u CBC - oncology following  Hx of GERD. - continue protonix   Best practice:  Diet: regular diet DVT prophylaxis: heparin gtt GI prophylaxis: PPI Mobility: OOB to chair Code Status: Full Disposition: med tele, will have TRH assume care and CCM will sign off.   Labs    CMP Latest Ref Rng & Units 10/08/2019  10/05/2019 10/04/2019  Glucose 70 - 99 mg/dL 182(H) 191(H) 185(H)  BUN 6 - 20 mg/dL <5(L) 9 7  Creatinine 0.44 - 1.00 mg/dL 0.74 0.77 0.90  Sodium 135 - 145 mmol/L 135 134(L) 133(L)  Potassium 3.5 - 5.1 mmol/L 3.6 3.8 3.6  Chloride 98 - 111 mmol/L 102 103 102  CO2 22 - 32 mmol/L  21(L) 21(L) 20(L)  Calcium 8.9 - 10.3 mg/dL 8.4(L) 8.9 9.1  Total Protein 6.5 - 8.1 g/dL - 5.0(L) 5.0(L)  Total Bilirubin 0.3 - 1.2 mg/dL - 1.4(H) 1.4(H)  Alkaline Phos 38 - 126 U/L - 73 62  AST 15 - 41 U/L - 14(L) 14(L)  ALT 0 - 44 U/L - 27 38    CBC Latest Ref Rng & Units 10/06/2019 10/05/2019 10/04/2019  WBC 4.0 - 10.5 K/uL 7.4 4.6 2.4(L)  Hemoglobin 12.0 - 15.0 g/dL 10.9(L) 11.4(L) 11.0(L)  Hematocrit 36.0 - 46.0 % 32.9(L) 33.6(L) 33.0(L)  Platelets 150 - 400 K/uL 89(L) 47(L) 39(L)    ABG    Component Value Date/Time   PHART 7.338 (L) 10/02/2019 2017   PCO2ART 40.7 10/02/2019 2017   PO2ART 353.0 (H) 10/02/2019 2017   HCO3 21.9 10/02/2019 2017   TCO2 23 10/02/2019 2017   ACIDBASEDEF 4.0 (H) 10/02/2019 2017   O2SAT 100.0 10/02/2019 2017    [id number] 55min of time    Audria Nine DO [facility] 10/08/2019, 10:50 AM

## 2019-10-08 NOTE — Progress Notes (Signed)
ANTICOAGULATION CONSULT NOTE  Pharmacy Consult for heparin Indication: pulmonary embolus   Patient Measurements: Height: 5\' 1"  (154.9 cm) Weight: 211 lb (95.7 kg) IBW/kg (Calculated) : 47.8 Heparin Dosing Weight: 71 kg  Vital Signs: Temp: 98 F (36.7 C) (12/28 0800) Temp Source: Oral (12/28 0800) BP: 194/165 (12/28 0900) Pulse Rate: 124 (12/28 1000)  Labs: Recent Labs    10/06/19 1115 10/06/19 1117 10/07/19 0941 10/08/19 0924  HGB 10.9*  --   --   --   HCT 32.9*  --   --   --   PLT 89*  --   --   --   APTT  --  115*  --   --   LABPROT  --  14.0  --   --   INR  --  1.1  --   --   HEPARINUNFRC  --  0.40 0.48 0.47  CREATININE  --   --   --  0.74      Assessment: Pharmacy consulted to dose/monitor heparin in this 48 year old female with PMH significant for breast cancer on chemotherapy with curative intent. CT angio on 12/18 revealed acute bilateral PE, acute occlusive SVC thrombosis.  Heparin level therapeutic this AM, CBC stable. Some bleeding in sputum on 12/27 but now stable.  Goal of Therapy:  Heparin level 0.3-0.7 units/ml Monitor platelets by anticoagulation protocol: Yes   Plan:   Continue heparin gtt at 1900 units/hr Daily heparin level, CBC, s/s bleeding   [name] 10/08/2019 10:45 AM

## 2019-10-08 NOTE — Progress Notes (Signed)
  Echocardiogram 2D Echocardiogram has been performed.  Veronica [name] 10/08/2019, 3:52 PM

## 2019-10-09 ENCOUNTER — Encounter: Payer: Self-pay | Admitting: *Deleted

## 2019-10-09 ENCOUNTER — Ambulatory Visit: Payer: 59 | Admitting: Oncology

## 2019-10-09 ENCOUNTER — Other Ambulatory Visit: Payer: Self-pay | Admitting: Oncology

## 2019-10-09 ENCOUNTER — Inpatient Hospital Stay (HOSPITAL_COMMUNITY): Payer: 59

## 2019-10-09 ENCOUNTER — Other Ambulatory Visit: Payer: 59

## 2019-10-09 ENCOUNTER — Ambulatory Visit: Payer: 59

## 2019-10-09 DIAGNOSIS — R000 Tachycardia, unspecified: Secondary | ICD-10-CM

## 2019-10-09 DIAGNOSIS — I10 Essential (primary) hypertension: Secondary | ICD-10-CM

## 2019-10-09 DIAGNOSIS — Z170 Estrogen receptor positive status [ER+]: Secondary | ICD-10-CM

## 2019-10-09 DIAGNOSIS — K219 Gastro-esophageal reflux disease without esophagitis: Secondary | ICD-10-CM

## 2019-10-09 DIAGNOSIS — T451X5A Adverse effect of antineoplastic and immunosuppressive drugs, initial encounter: Secondary | ICD-10-CM

## 2019-10-09 DIAGNOSIS — C50411 Malignant neoplasm of upper-outer quadrant of right female breast: Secondary | ICD-10-CM

## 2019-10-09 LAB — COMPREHENSIVE METABOLIC PANEL
ALT: 56 U/L — ABNORMAL HIGH (ref 0–44)
AST: 52 U/L — ABNORMAL HIGH (ref 15–41)
Albumin: 2.8 g/dL — ABNORMAL LOW (ref 3.5–5.0)
Alkaline Phosphatase: 140 U/L — ABNORMAL HIGH (ref 38–126)
Anion gap: 11 (ref 5–15)
BUN: 9 mg/dL (ref 6–20)
CO2: 23 mmol/L (ref 22–32)
Calcium: 9.2 mg/dL (ref 8.9–10.3)
Chloride: 101 mmol/L (ref 98–111)
Creatinine, Ser: 0.81 mg/dL (ref 0.44–1.00)
GFR calc Af Amer: >60 mL/min (ref >60)
GFR calc non Af Amer: >60 mL/min (ref >60)
Glucose, Bld: 198 mg/dL — ABNORMAL HIGH (ref 70–99)
Potassium: 4 mmol/L (ref 3.5–5.1)
Sodium: 135 mmol/L (ref 135–145)
Total Bilirubin: 1 mg/dL (ref 0.3–1.2)
Total Protein: 5.9 g/dL — ABNORMAL LOW (ref 6.5–8.1)

## 2019-10-09 LAB — PHOSPHORUS: Phosphorus: 5 mg/dL — ABNORMAL HIGH (ref 2.5–4.6)

## 2019-10-09 LAB — CBC WITH DIFFERENTIAL/PLATELET
Abs Immature Granulocytes: 0 10*3/uL (ref 0.00–0.07)
Basophils Absolute: 0 10*3/uL (ref 0.0–0.1)
Basophils Relative: 0 %
Eosinophils Absolute: 0 10*3/uL (ref 0.0–0.5)
Eosinophils Relative: 0 %
HCT: 32.9 % — ABNORMAL LOW (ref 36.0–46.0)
Hemoglobin: 10.7 g/dL — ABNORMAL LOW (ref 12.0–15.0)
Lymphocytes Relative: 12 %
Lymphs Abs: 1.9 10*3/uL (ref 0.7–4.0)
MCH: 29.7 pg (ref 26.0–34.0)
MCHC: 32.5 g/dL (ref 30.0–36.0)
MCV: 91.4 fL (ref 80.0–100.0)
Monocytes Absolute: 0.8 10*3/uL (ref 0.1–1.0)
Monocytes Relative: 5 %
Neutro Abs: 13 10*3/uL — ABNORMAL HIGH (ref 1.7–7.7)
Neutrophils Relative %: 83 %
Platelets: 277 10*3/uL (ref 150–400)
RBC: 3.6 MIL/uL — ABNORMAL LOW (ref 3.87–5.11)
RDW: 15 % (ref 11.5–15.5)
WBC: 15.7 10*3/uL — ABNORMAL HIGH (ref 4.0–10.5)
nRBC: 0.3 % — ABNORMAL HIGH (ref 0.0–0.2)

## 2019-10-09 LAB — PROTIME-INR
INR: 1 (ref 0.8–1.2)
Prothrombin Time: 13.5 seconds (ref 11.4–15.2)

## 2019-10-09 LAB — C-REACTIVE PROTEIN: CRP: 10.9 mg/dL — ABNORMAL HIGH (ref <1.0)

## 2019-10-09 LAB — D-DIMER, QUANTITATIVE: D-Dimer, Quant: 4.51 ug/mL-FEU — ABNORMAL HIGH (ref 0.00–0.50)

## 2019-10-09 LAB — MAGNESIUM: Magnesium: 1.9 mg/dL (ref 1.7–2.4)

## 2019-10-09 LAB — FERRITIN: Ferritin: 1470 ng/mL — ABNORMAL HIGH (ref 11–307)

## 2019-10-09 LAB — HEPARIN LEVEL (UNFRACTIONATED): Heparin Unfractionated: 0.35 IU/mL (ref 0.30–0.70)

## 2019-10-09 IMAGING — DX DG CHEST 1V PORT
1 series · 1 of 1 positions shown · non-contrast
Comparison: Radiograph [DATE]. Chest CT [DATE]

CLINICAL DATA: Pleural effusion.

EXAM:
PORTABLE CHEST 1 VIEW

[chest ap]
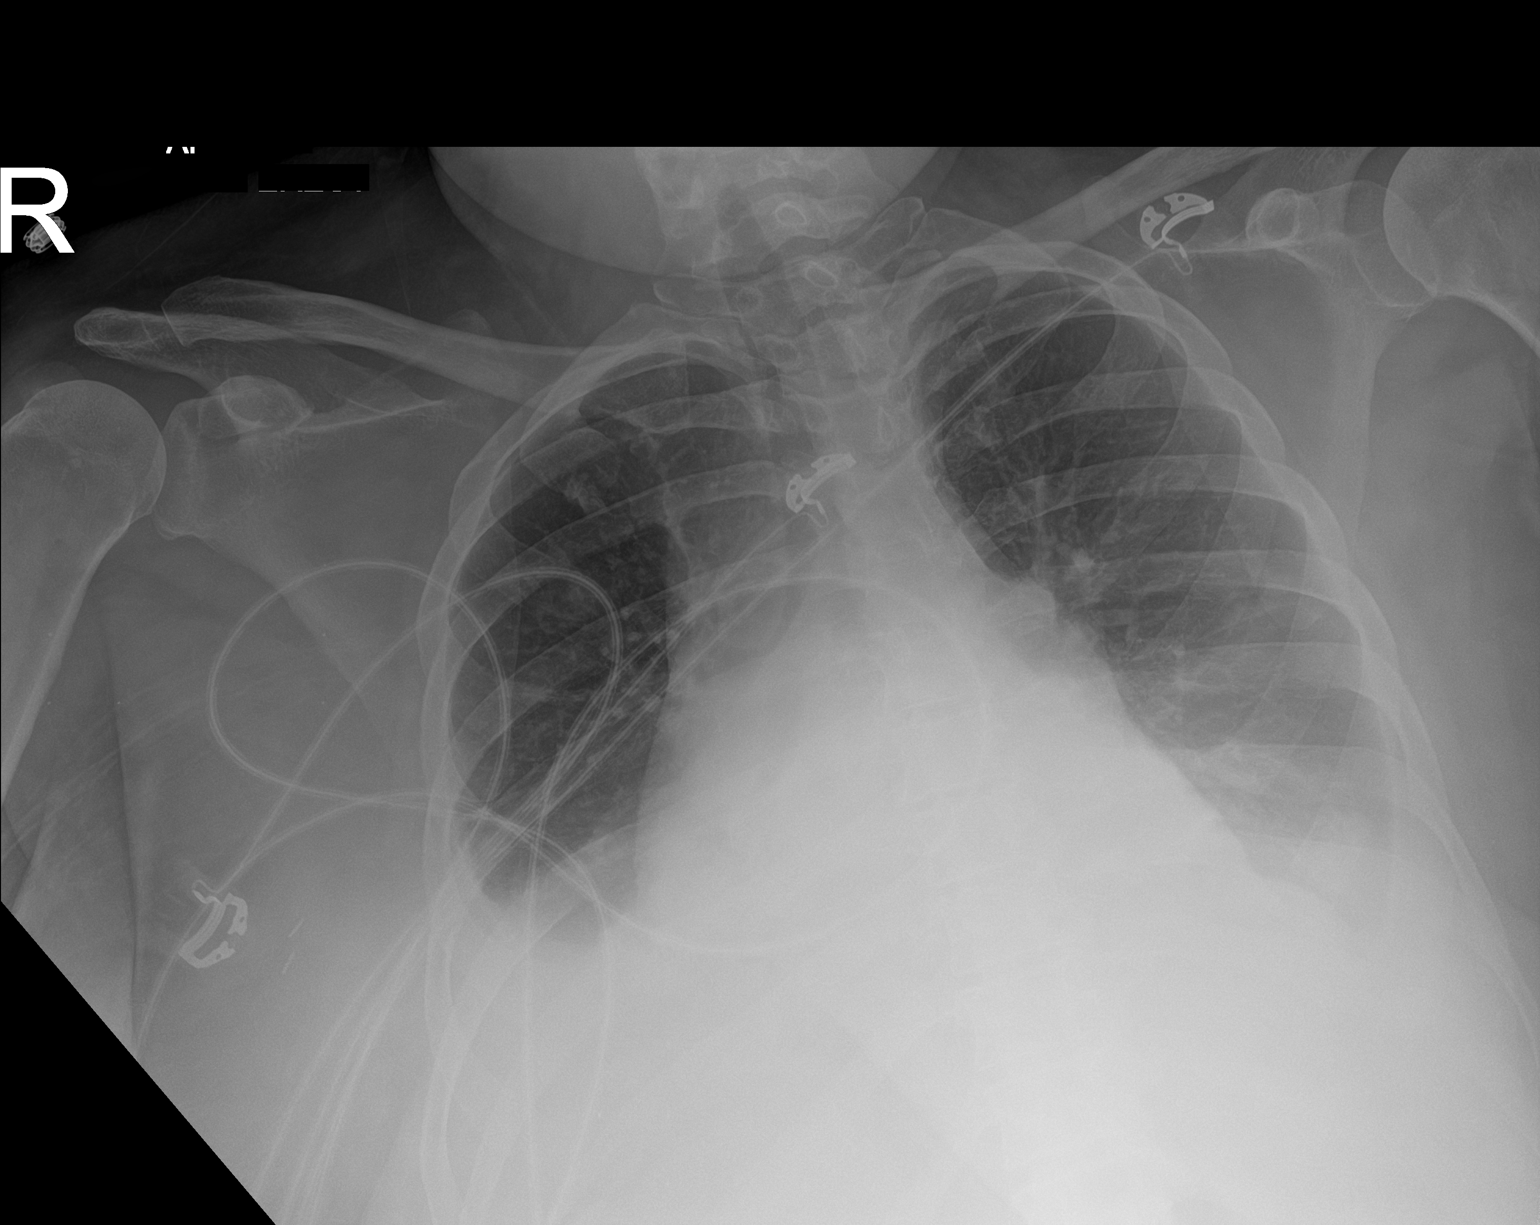

[1 of 1 positions shown; findings below may reference images not displayed]

FINDINGS: Hazy lung base opacities are not significantly changed from prior
exam, likely combination of pleural fluid and atelectasis. Unchanged
heart size and mediastinal contours. Pericardial drainage catheter
on prior exam is not well visualized currently. No visualized
pneumothorax.
IMPRESSION: 1. Unchanged hazy lung base opacities, likely combination of pleural
fluid and atelectasis.
2. Unchanged mediastinal contours and heart size after removal of
pericardial drainage catheter.

## 2019-10-09 MED ORDER — WARFARIN - PHARMACIST DOSING INPATIENT: Freq: Every day | Status: DC

## 2019-10-09 MED ORDER — WARFARIN VIDEO: Freq: Once | Status: AC

## 2019-10-09 MED ORDER — WARFARIN SODIUM 5 MG PO TABS
5.0000 mg | ORAL_TABLET | Freq: Once | ORAL | Status: AC
  Administered 2019-10-09: 5 mg via ORAL
  Filled 2019-10-09: qty 1

## 2019-10-09 MED ORDER — FAMOTIDINE 20 MG PO TABS
10.0000 mg | ORAL_TABLET | Freq: Two times a day (BID) | ORAL | Status: DC
  Administered 2019-10-09 – 2019-10-19 (×21): 10 mg via ORAL
  Filled 2019-10-09 (×22): qty 1

## 2019-10-09 MED ORDER — COUMADIN BOOK
Freq: Once | Status: AC
  Filled 2019-10-09: qty 1

## 2019-10-09 NOTE — Progress Notes (Signed)
Veronica [name]   DOB:Nov 23, 1970   LT#:[id number]   AQT#:[id number]  Subjective: Visit performed via E-link. Veronica [name] remains in the ICU. She reports that her facial swelling is better. Reports ongoing swelling in her right arm and in her legs.Pericardial drain has been removed. Reports some shortness of breath with exertion, but is not short of breath at rest.  Objective: young white woman examined in bed Vitals:   10/09/19 0859 10/09/19 0900  BP:  (!) 130/108  Pulse: (!) 114 (!) 113  Resp:  (!) 27  Temp: 98.8 F (37.1 C)   SpO2: 97% 97%    Body mass index is 39.87 kg/m.  Intake/Output Summary (Last 24 hours) at 10/09/2019 1155 Last data filed at 10/09/2019 1000 Gross per 24 hour  Intake 1681.14 ml  Output 1 ml  Net 1680.14 ml    CBG (last 3)  No results for input(s): GLUCAP in the last 72 hours.   Labs:  Lab Results  Component Value Date   WBC 13.1 (H) 10/08/2019   HGB 10.3 (L) 10/08/2019   HCT 31.8 (L) 10/08/2019   MCV 91.6 10/08/2019   PLT PLATELET CLUMPS NOTED ON SMEAR, UNABLE TO ESTIMATE 10/08/2019   NEUTROABS 5.8 10/06/2019    '@LASTCHEMISTRY' @  Urine Studies No results for input(s): UHGB, CRYS in the last 72 hours.  Invalid input(s): UACOL, UAPR, USPG, UPH, UTP, UGL, UKET, UBIL, UNIT, UROB, [place], UEPI, UWBC, Duwayne Heck [place], [place]  Basic Metabolic Panel: Recent Labs  Lab 10/02/19 2017 10/03/19 0601 10/04/19 0454 10/05/19 0230 10/08/19 0924  NA 133* 138 133* 134* 135  K 4.1 3.7 3.6 3.8 3.6  CL  --  106 102 103 102  CO2  --  22 20* 21* 21*  GLUCOSE  --  116* 185* 191* 182*  BUN  --  <5* 7 9 <5*  CREATININE  --  0.72 0.90 0.77 0.74  CALCIUM  --  10.3 9.1 8.9 8.4*  MG  --  1.6* 1.9 1.9  --    GFR Estimated Creatinine Clearance: 92.9 mL/min (by C-G formula based on SCr of 0.74 mg/dL). Liver Function Tests: Recent Labs  Lab 10/03/19 0601 10/04/19 0454 10/05/19 0230  AST 33 14* 14*  ALT 61* 38 27  ALKPHOS 68 62 73  BILITOT 1.5* 1.4* 1.4*  PROT  5.8* 5.0* 5.0*  ALBUMIN 4.0 3.2* 2.8*   No results for input(s): LIPASE, AMYLASE in the last 168 hours. No results for input(s): AMMONIA in the last 168 hours. Coagulation profile Recent Labs  Lab 10/06/19 1117  INR 1.1    CBC: Recent Labs  Lab 10/03/19 0601 10/04/19 0454 10/05/19 0230 10/06/19 1115 10/08/19 0924  WBC 1.0* 2.4* 4.6 7.4 13.1*  NEUTROABS 0.6* 1.5* 3.2 5.8  --   HGB 11.2* 11.0* 11.4* 10.9* 10.3*  HCT 33.6* 33.0* 33.6* 32.9* 31.8*  MCV 88.9 90.2 89.4 89.6 91.6  PLT 31* 39* 47* 89* PLATELET CLUMPS NOTED ON SMEAR, UNABLE TO ESTIMATE   Cardiac Enzymes: No results for input(s): CKTOTAL, CKMB, CKMBINDEX, TROPONINI in the last 168 hours. BNP: Invalid input(s): POCBNP CBG: No results for input(s): GLUCAP in the last 168 hours. D-Dimer No results for input(s): DDIMER in the last 72 hours. Hgb A1c No results for input(s): HGBA1C in the last 72 hours. Lipid Profile No results for input(s): CHOL, HDL, LDLCALC, TRIG, CHOLHDL, LDLDIRECT in the last 72 hours. Thyroid function studies No results for input(s): TSH, T4TOTAL, T3FREE, THYROIDAB in the last 72 hours.  Invalid  input(s): FREET3 Anemia work up No results for input(s): VITAMINB12, FOLATE, FERRITIN, TIBC, IRON, RETICCTPCT in the last 72 hours. Microbiology Recent Results (from the past 240 hour(s))  MRSA PCR Screening     Status: None   Collection Time: 10/02/19  8:57 PM   Specimen: Nasal Mucosa; Nasopharyngeal  Result Value Ref Range Status   MRSA by PCR NEGATIVE NEGATIVE Final    Comment:        The GeneXpert MRSA Assay (FDA approved for NASAL specimens only), is one component of a comprehensive MRSA colonization surveillance program. It is not intended to diagnose MRSA infection nor to guide or monitor treatment for MRSA infections. Performed at [facility], [place] [address], [place], [place] [address]       Studies:  DG Chest Port 1 View  Result Date: 10/09/2019 CLINICAL DATA:   Pleural effusion. EXAM: PORTABLE CHEST 1 VIEW COMPARISON:  Radiograph 10/06/2019. Chest CT 09/28/2019 FINDINGS: Hazy lung base opacities are not significantly changed from prior exam, likely combination of pleural fluid and atelectasis. Unchanged heart size and mediastinal contours. Pericardial drainage catheter on prior exam is not well visualized currently. No visualized pneumothorax. IMPRESSION: 1. Unchanged hazy lung base opacities, likely combination of pleural fluid and atelectasis. 2. Unchanged mediastinal contours and heart size after removal of pericardial drainage catheter. Electronically Signed   By: Keith Rake M.D.   On: 10/09/2019 06:32   ECHOCARDIOGRAM LIMITED  Result Date: 10/08/2019   ECHOCARDIOGRAM LIMITED REPORT   Patient Name:   Veronica [name] Date of Exam: 10/08/2019 Medical Rec #:  [id number]      Height:       61.0 in Accession #:    [id number]     Weight:       211.0 lb Date of Birth:  [birth year]      BSA:          1.93 m Patient Age:    48 years       BP:           117/80 mmHg Patient Gender: F              HR:           109 bpm. Exam Location:  Inpatient  Procedure: Limited Echo, Cardiac Doppler and Color Doppler Indications:    I31.3 Pericardial effusion (noninflammatory)  History:        Patient has prior history of Echocardiogram examinations, most                 recent 10/03/2019. Pericardial effusion. Cancer. Chemo.                 Tamponade. Covid 19 positive.  Sonographer:    Roseanna Rainbow RDCS Referring Phys: [id number] PETER VAN TRIGT  Sonographer Comments: Technically difficult study due to poor echo windows, suboptimal subcostal window, no subcostal window and patient is morbidly obese. Image acquisition challenging due to patient body habitus. Abdominal dressing in subcostal region. IMPRESSIONS  1. Left ventricular ejection fraction, by visual estimation, is 60 to 65%.  2. Small to moderate, circumferential but mostly posterior pericardial effusion.  3. The pericardial effusion  is posterior to the left ventricle.  4. The inferior vena cava is dilated in size with <50% respiratory variability, suggesting right atrial pressure of 15 mmHg.  5. Unable to directly compare prior study on 10/03/19 due to suboptimal images, however, in comparison to TEE images during pericardial window, there appears to be a larger pericardial  effusion FINDINGS  Left Ventricle: Left ventricular ejection fraction, by visual estimation, is 60 to 65%. Pericardium: A small pericardial effusion is present is seen. A small pericardial effusion is present. The pericardial effusion is posterior to the left ventricle. Venous: The inferior vena cava is dilated in size with less than 50% respiratory variability, suggesting right atrial pressure of 15 mmHg.  LEFT VENTRICLE         Normals PLAX 2D LVIDd:         3.45 cm 3.6 cm LVIDs:         2.50 cm 1.7 cm LV PW:         1.15 cm 1.4 cm LV IVS:        1.10 cm 1.3 cm LV SV:         27 ml   79 ml LV SV Index:   12.88   45 ml/m2  at IVC IVC diam: 2.20 cm LEFT ATRIUM         Index LA diam:    3.00 cm 1.55 cm/m   AORTA                 Normals Ao Root diam: 2.75 cm 31 mm  Lyman Bishop MD Electronically signed by Lyman Bishop MD Signature Date/Time: 10/08/2019/5:07:46 PM    Final     Assessment: 48 y.o. Veronica [name], [place] woman admitted with extensive SVC-associated clot/ PEs, complicated by clinical SVC syndrome, in the setting of early-stage breast cancer treatment as follows  (0) status post right breast upper outer quadrant biopsy 05/01/2019 for a clinical T1c N1, stage IIA invasive ductal carcinoma, grade 3, estrogen and progesterone receptor positive, HER-2 nonamplified, with an MIB-1-1 of 20%.             (a) mass in the axillary tail was a positive lymph node   (1) MammaPrint obtained from the original biopsy shows a high risk luminal subtype B tumor   (2) genetics testing 05/09/2019 through the Common Hereditary Gene Panel offered by [facility] found no deleterious  mutations in APC, ATM, AXIN2, BARD1, BMPR1A, BRCA1, BRCA2, BRIP1, CDH1, CDK4, CDKN2A (p14ARF), CDKN2A (p16INK4a), CHEK2, CTNNA1, DICER1, EPCAM (Deletion/duplication testing only), GREM1 (promoter region deletion/duplication testing only), KIT, MEN1, MLH1, MSH2, MSH3, MSH6, MUTYH, NBN, NF1, NHTL1, PALB2, PDGFRA, PMS2, POLD1, POLE, PTEN, RAD50, RAD51C, RAD51D, RNF43, SDHB, SDHC, SDHD, SMAD4, SMARCA4. STK11, TP53, TSC1, TSC2, and VHL.  The following genes were evaluated for sequence changes only: SDHA and HOXB13 c.251G>A variant only.              (a) A variant of uncertain significance (VUS) was detected in one of her MSH6 genes (c.831A>C).   (3) status post right lumpectomy and sentinel lymph node sampling 06/12/2019 for a pT2 pN1, stage IIA invasive ductal carcinoma, grade 2, with positive margins             (a) a total of 4 sentinel lymph nodes removed, one positive (with ECE), ine itc             (b) margin clearance 04/19/2019 successful medial margin close but negative for DCIS   (4) adjuvant chemotherapy will consist of doxorubicin and cyclophosphamide in dose dense fashion x4 starting 07/10/2019 followed by weekly paclitaxel x12             (a) echo 06/26/2019 shows an EF of 60-65%             (b echo on 09/19/2019 shows an EF of 60-65%   (  5) adjuvant radiation to follow   (6) antiestrogens to start at the completion of local treatment  (7) CT angio 09/28/2019 show bilateral pulmonary emboli and a filling defect in the SVC associated with port  (a) thrombectomy 10/02/2019 [facility] complicated by large hemopericardium and tamponade requiring subxiphoid pericardial window    Plan:  [place] is is now over 2 weeks out from cycle 4 of her chemotherapy; this final cycle was given at reduced doses. No labs have been drawn today. Labs from yesterday show a mildly elevated white blood cell count of 13.1 which is likely due to her Neulasta injection following her last chemotherapy. Her  hemoglobin stable at 10.3. Platelets were clumped yesterday but 2 days prior they had improved 89,000. She is not reporting any bleeding today.  Symptoms from her SVC syndrome seem to be improving. Pericardial drain has now been removed. Repeat echocardiogram performed on 10/08/2019 showed an LVEF of 60 to 65% with a small pericardial effusion. Cardiothoracic surgery is monitoring.  We will plan for outpatient follow-up after discharge.  Will follow with you  Mikey Bussing, NP 10/09/2019  11:55 AM Medical Oncology and Hematology [facility] [address] [place], [place] [address] Tel. [phone]    Fax. [phone]

## 2019-10-09 NOTE — Progress Notes (Signed)
Assisted tele visit to patient with provider.  Markez Dowland Anderson, RN   

## 2019-10-09 NOTE — Progress Notes (Signed)
ICU Transitions of Care Pharmacist Intervention    Veronica [name] is a 48 y.o. female admitted on 09/28/2019  and has been in the intensive care unit for 6d 14h.  Medical History: Past Medical History:  Diagnosis Date  . Cancer ([place])   . Chronic low back pain with left-sided sciatica   . Family history of leukemia   . Family history of stomach cancer   . Hypertension       Has a pharmacist made an intervention on this patient's medication list?  yes, because the patient stayed in medical-surgical ICU for > 48 hours, is discharging from ICU to another level of care within [facility], and has active orders for medications that should be discontinued upon transition out of ICU.    Transitions of care were discussed and the following interventions were made:   Stress Ulcer Prophylaxis:  A medication for stress ulcer prophylaxis was started during ICU admission and not intended to be continued at discharge. Protonix was stopped and patient's home acid suppressing therapy was resumed     October 09, 2019  Roddrick Sharron, Jake Church

## 2019-10-09 NOTE — Progress Notes (Addendum)
7 Days Post-Op Procedure(s) (LRB): PERCUTANEOUS  SVC THROMBECTOMY USING INARI (N/A) Central VENOGRAM (N/A) Intravascular Ultrasound (N/A) Removal Port-A-Cath Insertion Central Line Adult Ultrasound Guidance For Vascular Access Pericardial Window (N/A) Subjective: O2 sat 100% on RA Postop echo 12-28 shows some re accumulation of posterior pericardial effusion- no tamponade yet Need to stop heparin now and start coumadin this pm for DVT small lower lobe PE 11 days ago - would use coumadin, not NOAC so that degree of anticoagulation can be measured  Objective: Vital signs in last 24 hours: Temp:  [97.8 F (36.6 C)-98.8 F (37.1 C)] 98.8 F (37.1 C) (12/29 0859) Pulse Rate:  [101-129] 113 (12/29 0900) Cardiac Rhythm: Sinus tachycardia (12/29 0400) Resp:  [0-29] 27 (12/29 0900) BP: (91-130)/(57-108) 130/108 (12/29 0900) SpO2:  [89 %-100 %] 97 % (12/29 0900)     Exam  Patient breathing comfortably on room air up in chair Breath sounds slightly diminished but clear Subxiphoid incision with minimal serous drainage-fat necrosis.  Chest tube incision and suture intact without drainage Sinus rhythm   Hemodynamic parameters for last 24 hours:  stable  Intake/Output from previous day: 12/28 0701 - 12/29 0700 In: 1369.8 [P.O.:960; I.V.:409.8] Out: 200 [Urine:200] Intake/Output this shift: Total I/O In: 120 [P.O.:120] Out: 1 [Stool:1]  Lower chest incision slowly healing CXR with small bilat pleural effusions  Lab Results: Recent Labs    10/06/19 1115 10/08/19 0924  WBC 7.4 13.1*  HGB 10.9* 10.3*  HCT 32.9* 31.8*  PLT 89* PLATELET CLUMPS NOTED ON SMEAR, UNABLE TO ESTIMATE   BMET:  Recent Labs    10/08/19 0924  NA 135  K 3.6  CL 102  CO2 21*  GLUCOSE 182*  BUN <5*  CREATININE 0.74  CALCIUM 8.4*    PT/INR:  Recent Labs    10/06/19 1117  LABPROT 14.0  INR 1.1   ABG    Component Value Date/Time   PHART 7.338 (L) 10/02/2019 2017   HCO3 21.9 10/02/2019  2017   TCO2 23 10/02/2019 2017   ACIDBASEDEF 4.0 (H) 10/02/2019 2017   O2SAT 100.0 10/02/2019 2017   CBG (last 3)  No results for input(s): GLUCAP in the last 72 hours.  Assessment/Plan: S/P Procedure(s) (LRB): PERCUTANEOUS  SVC THROMBECTOMY USING INARI (N/A) Central VENOGRAM (N/A) Intravascular Ultrasound (N/A) Removal Port-A-Cath Insertion Central Line Adult Ultrasound Guidance For Vascular Access Pericardial Window (N/A) Recurrent  Small pericardial effusion related to postop anticoagulation- stop heparin and start coumadin goal 2.0-2.4 Chest x-ray today shows decreased lung volumes, encourage incentive spirometry and mobility as allowed will check another echocardiogram in 6 to 7 days.  LOS: 11 days    Veronica [name] 10/09/2019

## 2019-10-09 NOTE — Progress Notes (Addendum)
ANTICOAGULATION CONSULT NOTE  Pharmacy Consult for heparin Indication: pulmonary embolus   Patient Measurements: Height: 5\' 1"  (154.9 cm) Weight: 211 lb (95.7 kg) IBW/kg (Calculated) : 47.8 Heparin Dosing Weight: 71 kg  Vital Signs: Temp: 98.3 F (36.8 C) (12/29 0300) Temp Source: Oral (12/29 0300) BP: 115/84 (12/29 0600) Pulse Rate: 108 (12/29 0700)  Labs: Recent Labs    10/06/19 1115 10/06/19 1117 10/07/19 0941 10/08/19 0924 10/09/19 0500  HGB 10.9*  --   --  10.3*  --   HCT 32.9*  --   --  31.8*  --   PLT 89*  --   --  PLATELET CLUMPS NOTED ON SMEAR, UNABLE TO ESTIMATE  --   APTT  --  115*  --   --   --   LABPROT  --  14.0  --   --   --   INR  --  1.1  --   --   --   HEPARINUNFRC  --  0.40 0.48 0.47 0.35  CREATININE  --   --   --  0.74  --       Assessment: Pharmacy consulted to dose/monitor heparin in this 48 year old female with PMH significant for breast cancer on chemotherapy with curative intent. CT angio on 12/18 revealed acute bilateral PE, acute occlusive SVC thrombosis.  Heparin level therapeutic this AM, CBC stable. Some bleeding in sputum on 12/27 but now stable.  Goal of Therapy:  Heparin level 0.3-0.7 units/ml Monitor platelets by anticoagulation protocol: Yes   Plan:   Continue heparin gtt at 1500 units/hr Daily heparin level, CBC, s/s bleeding F/u plan for oral anticoagulation    [name] 10/09/2019 8:09 AM   Addendum -Patient will be started on warfarin, will begin with warfarin 5 mg po x1 -No bridge since patient has recurrent hemorrhagic pericardial effusion -Will target INR 2-2.4 per MD request   [name] 10/09/2019 10:29 AM

## 2019-10-09 NOTE — Progress Notes (Signed)
PROGRESS NOTE    Veronica [name]  [id number] DOB: 1971/01/07 DOA: 09/28/2019 PCP: Blair Heys, PA-C    Brief Narrative:  48 yo female admitted with PE and SVC syndrome with hx of breast cancer.  Had thrombectomy on [id number] complicated by hemopericardium and tamponade requiring subxyphoid pericardial window.  Incidentally found to be positive for COVID 19   Assessment & Plan:   Principal Problem:   Multiple subsegmental pulmonary emboli without acute cor pulmonale (HCC) Active Problems:   Malignant neoplasm of upper-outer quadrant of right breast in female, estrogen receptor positive (HCC)   Thrombocytopenia (HCC)   Superior vena cava thrombosis (HCC)   SVCO (superior vena cava obstruction)   SVC syndrome  1 acute PE with acute SVC syndrome status post mechanical thrombectomy complicated by hemopericardium with tamponade status post xiphoid window. Pericardial drain removed by cardiothoracic surgery on 10/07/2019. Patient being followed by vascular and cardiothoracic surgery.  Repeat 2D echo with a recurrent small pericardial effusion with normal EF.  Patient was on heparin which has been discontinued by cardiothoracic surgery and patient started on Coumadin with goal INR 2-2.4.  Continue Keflex for cellulitis at incision area.  Per cardiothoracic surgery.  2.  Sinus tachycardia/history of hypertension Blood pressure stable.  Patient still somewhat tachycardic however improving.  Continue Lopressor and uptitrate as needed for tachycardia.  Continue to hold Benicar.  Follow.  3.  Atelectasis, bilateral pleural effusions Continue low-dose Lasix.  Incentive spirometry.  4.  COVID-19 infection with initial concern for COVID-19 pneumonia Status post full course of remdesivir.  Patient improved clinically.  Patient with sats of 94 to 97% on room air.  Check inflammatory markers.  Follow.  5.  Chemotherapy-induced pancytopenia/A ER positive breast cancer Labs pending.  Per oncology  transfuse for hemoglobin less than 8 and platelet count less than 20.  Oncology following and appreciate input and recommendations.  6.  History of gastroesophageal reflux disease Continue Pepcid.   DVT prophylaxis: Coumadin Code Status: Full Family Communication: Updated patient.  Family on telephone at time of visit. Disposition Plan: Home when clinically improved, stable, INR therapeutic and when cleared by cardiothoracic surgery.   Consultants:  Vascular, TCTS, Oncology  Procedures:  CTA chest 12/18 > bilateral lower lobe PE, SVC occlusion, probable RLL infarct, small b/l effusions. CT head 12/18 > no acute findings. Echo 12/20 > EF 60-65%, small pericardial effusion. Echo 12/22 (post thrombectomy) >significantly reduced clot burden in the SVC with improved flow on the venogram. Large expanding circumferential pericardial effusion with evidence of tamponade. Echo 12/22 (post pericardial window) > resolution of pericardial effusion without evidence of tamponade, LVEF > 65%, small clot burden in SVC. ETT 12/22 >12/23 L radial art line 12/22 > Right femoral CVL >> 12/25 2D echo 10/08/2019  Significant Hospital Events   12/18 admit. 12/22 to OR for thrombectomy which was complicated bylarge hemopericardium with tamponade requiring subxyphoid pericardial window. 12/23 extubated 12/25 add lopressor for tachycardia 12/27 pericardial drain removed.    Antimicrobials:  Remdesivir 12/21 >> 12/25 Keflex 10/07/2019>>> 10/14/2019  Micro Data:  SARS CoV2 12/18 > positive.   Subjective: Patient sitting up in chair denies any significant shortness of breath.  Denies any chest pain.  States she is feeling better than on admission.  Objective: Vitals:   10/09/19 0700 10/09/19 0800 10/09/19 0859 10/09/19 0900  BP:  121/70  (!) 130/108  Pulse: (!) 108 (!) 107 (!) 114 (!) 113  Resp: (!) 25 (!) 26  (!) 27  Temp:  98.8 F (37.1 C)   TempSrc:   Oral   SpO2: 94% 100% 97% 97%    Weight:      Height:        Intake/Output Summary (Last 24 hours) at 10/09/2019 1052 Last data filed at 10/09/2019 0900 Gross per 24 hour  Intake 1415.02 ml  Output 1 ml  Net 1414.02 ml   Filed Weights   09/28/19 1659  Weight: 95.7 kg    Examination:  General exam: Appears calm and comfortable  Respiratory system: Clear to auscultation. Respiratory effort normal. Cardiovascular system: Tachycardia. No JVD, murmurs, rubs, gallops or clicks. No pedal edema. Gastrointestinal system: Abdomen is nondistended, soft and nontender. No organomegaly or masses felt. Normal bowel sounds heard. Central nervous system: Alert and oriented. No focal neurological deficits. Extremities: Symmetric 5 x 5 power. Skin: No rashes, lesions or ulcers Psychiatry: Judgement and insight appear normal. Mood & affect appropriate.     Data Reviewed: I have personally reviewed following labs and imaging studies  CBC: Recent Labs  Lab 10/03/19 0601 10/04/19 0454 10/05/19 0230 10/06/19 1115 10/08/19 0924  WBC 1.0* 2.4* 4.6 7.4 13.1*  NEUTROABS 0.6* 1.5* 3.2 5.8  --   HGB 11.2* 11.0* 11.4* 10.9* 10.3*  HCT 33.6* 33.0* 33.6* 32.9* 31.8*  MCV 88.9 90.2 89.4 89.6 91.6  PLT 31* 39* 47* 89* PLATELET CLUMPS NOTED ON SMEAR, UNABLE TO ESTIMATE   Basic Metabolic Panel: Recent Labs  Lab 10/02/19 2017 10/03/19 0601 10/04/19 0454 10/05/19 0230 10/08/19 0924  NA 133* 138 133* 134* 135  K 4.1 3.7 3.6 3.8 3.6  CL  --  106 102 103 102  CO2  --  22 20* 21* 21*  GLUCOSE  --  116* 185* 191* 182*  BUN  --  <5* 7 9 <5*  CREATININE  --  0.72 0.90 0.77 0.74  CALCIUM  --  10.3 9.1 8.9 8.4*  MG  --  1.6* 1.9 1.9  --    GFR: Estimated Creatinine Clearance: 92.9 mL/min (by C-G formula based on SCr of 0.74 mg/dL). Liver Function Tests: Recent Labs  Lab 10/03/19 0601 10/04/19 0454 10/05/19 0230  AST 33 14* 14*  ALT 61* 38 27  ALKPHOS 68 62 73  BILITOT 1.5* 1.4* 1.4*  PROT 5.8* 5.0* 5.0*  ALBUMIN 4.0  3.2* 2.8*   No results for input(s): LIPASE, AMYLASE in the last 168 hours. No results for input(s): AMMONIA in the last 168 hours. Coagulation Profile: Recent Labs  Lab 10/06/19 1117  INR 1.1   Cardiac Enzymes: No results for input(s): CKTOTAL, CKMB, CKMBINDEX, TROPONINI in the last 168 hours. BNP (last 3 results) No results for input(s): PROBNP in the last 8760 hours. HbA1C: No results for input(s): HGBA1C in the last 72 hours. CBG: No results for input(s): GLUCAP in the last 168 hours. Lipid Profile: No results for input(s): CHOL, HDL, LDLCALC, TRIG, CHOLHDL, LDLDIRECT in the last 72 hours. Thyroid Function Tests: No results for input(s): TSH, T4TOTAL, FREET4, T3FREE, THYROIDAB in the last 72 hours. Anemia Panel: No results for input(s): VITAMINB12, FOLATE, FERRITIN, TIBC, IRON, RETICCTPCT in the last 72 hours. Sepsis Labs: No results for input(s): PROCALCITON, LATICACIDVEN in the last 168 hours.  Recent Results (from the past 240 hour(s))  MRSA PCR Screening     Status: None   Collection Time: 10/02/19  8:57 PM   Specimen: Nasal Mucosa; Nasopharyngeal  Result Value Ref Range Status   MRSA by PCR NEGATIVE NEGATIVE Final    Comment:  The GeneXpert MRSA Assay (FDA approved for NASAL specimens only), is one component of a comprehensive MRSA colonization surveillance program. It is not intended to diagnose MRSA infection nor to guide or monitor treatment for MRSA infections. Performed at [facility], [place] [address], [place], [place] [address]          Radiology Studies: DG Chest Port 1 View  Result Date: 10/09/2019 CLINICAL DATA:  Pleural effusion. EXAM: PORTABLE CHEST 1 VIEW COMPARISON:  Radiograph 10/06/2019. Chest CT 09/28/2019 FINDINGS: Hazy lung base opacities are not significantly changed from prior exam, likely combination of pleural fluid and atelectasis. Unchanged heart size and mediastinal contours. Pericardial drainage catheter on prior  exam is not well visualized currently. No visualized pneumothorax. IMPRESSION: 1. Unchanged hazy lung base opacities, likely combination of pleural fluid and atelectasis. 2. Unchanged mediastinal contours and heart size after removal of pericardial drainage catheter. Electronically Signed   By: Keith Rake M.D.   On: 10/09/2019 06:32   ECHOCARDIOGRAM LIMITED  Result Date: 10/08/2019   ECHOCARDIOGRAM LIMITED REPORT   Patient Name:   Veronica [name] Date of Exam: 10/08/2019 Medical Rec #:  [id number]      Height:       61.0 in Accession #:    [id number]     Weight:       211.0 lb Date of Birth:  1971-09-03      BSA:          1.93 m Patient Age:    24 years       BP:           117/80 mmHg Patient Gender: F              HR:           109 bpm. Exam Location:  Inpatient  Procedure: Limited Echo, Cardiac Doppler and Color Doppler Indications:    I31.3 Pericardial effusion (noninflammatory)  History:        Patient has prior history of Echocardiogram examinations, most                 recent 10/03/2019. Pericardial effusion. Cancer. Chemo.                 Tamponade. Covid 19 positive.  Sonographer:    Roseanna Rainbow RDCS Referring Phys: [id number] PETER VAN TRIGT  Sonographer Comments: Technically difficult study due to poor echo windows, suboptimal subcostal window, no subcostal window and patient is morbidly obese. Image acquisition challenging due to patient body habitus. Abdominal dressing in subcostal region. IMPRESSIONS  1. Left ventricular ejection fraction, by visual estimation, is 60 to 65%.  2. Small to moderate, circumferential but mostly posterior pericardial effusion.  3. The pericardial effusion is posterior to the left ventricle.  4. The inferior vena cava is dilated in size with <50% respiratory variability, suggesting right atrial pressure of 15 mmHg.  5. Unable to directly compare prior study on 10/03/19 due to suboptimal images, however, in comparison to TEE images during pericardial window, there appears to  be a larger pericardial effusion FINDINGS  Left Ventricle: Left ventricular ejection fraction, by visual estimation, is 60 to 65%. Pericardium: A small pericardial effusion is present is seen. A small pericardial effusion is present. The pericardial effusion is posterior to the left ventricle. Venous: The inferior vena cava is dilated in size with less than 50% respiratory variability, suggesting right atrial pressure of 15 mmHg.  LEFT VENTRICLE         Normals  PLAX 2D LVIDd:         3.45 cm 3.6 cm LVIDs:         2.50 cm 1.7 cm LV PW:         1.15 cm 1.4 cm LV IVS:        1.10 cm 1.3 cm LV SV:         27 ml   79 ml LV SV Index:   12.88   45 ml/m2  at IVC IVC diam: 2.20 cm LEFT ATRIUM         Index LA diam:    3.00 cm 1.55 cm/m   AORTA                 Normals Ao Root diam: 2.75 cm 31 mm  Lyman Bishop MD Electronically signed by Lyman Bishop MD Signature Date/Time: 10/08/2019/5:07:46 PM    Final         Scheduled Meds: . cephALEXin  500 mg Oral Q8H  . Chlorhexidine Gluconate Cloth  6 each Topical Daily  . coumadin book   Does not apply Once  . famotidine  10 mg Oral BID  . feeding supplement (ENSURE ENLIVE)  237 mL Oral TID BM  . feeding supplement (PRO-STAT SUGAR FREE 64)  30 mL Oral BID  . furosemide  10 mg Oral Daily  . mouth rinse  15 mL Mouth Rinse BID  . metoprolol tartrate  25 mg Oral BID  . sodium chloride flush  10-40 mL Intracatheter Q12H  . warfarin  5 mg Oral ONCE-1800  . warfarin   Does not apply Once  . Warfarin - Pharmacist Dosing Inpatient   Does not apply q1800   Continuous Infusions: . sodium chloride Stopped (10/05/19 0829)     LOS: 11 days    Time spent: 40 minutes    Irine Seal, MD [facility]  If 7PM-7AM, please contact night-coverage [email or web address] 10/09/2019, 10:52 AM

## 2019-10-09 NOTE — Evaluation (Signed)
Physical Therapy Evaluation Patient Details Name: Veronica [name] MRN: [id number] DOB: [birth year] Today's Date: 10/09/2019   History of Present Illness  Pt adm with facial and UE swelling. 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 Pt currently being treated for metastatic breast CA. Pt found to be covid +. Pt underwent percutaneous SVC thrombectomy on 12/22 and developed Large hemopericardium with tamponade and underwent emergent Subxyphoid pericardial window for drainage of hemopericardium. PMH - metastatic breast CA with lumpectomy 04/2019, currently undergoing chemo with radiation planned, htn, low back pain  Clinical Impression  Pt doing well with mobility. Currently using rolling walker but hopeful that she can progress away from that quickly. Will follow acutely but doubt that she will need PT at dc.     Follow Up Recommendations No PT follow up    Equipment Recommendations  Other (comment)(To be determined)    Recommendations for Other Services       Precautions / Restrictions Precautions Precautions: None      Mobility  Bed Mobility               General bed mobility comments: Pt up in chair  Transfers Overall transfer level: Modified independent Equipment used: None             General transfer comment: able to stand from chair and commode unassisted  Ambulation/Gait Ambulation/Gait assistance: Supervision Gait Distance (Feet): 300 Feet Assistive device: Rolling walker (2 wheeled);None Gait Pattern/deviations: Step-through pattern;Decreased stride length Gait velocity: decr Gait velocity interpretation: 1.31 - 2.62 ft/sec, indicative of limited community ambulator General Gait Details: Pt amb in room short distance without assistive device. Gait guarded. Amb on hall with rolling walker with more confident gait. Pt with HR to 135 with amb.   Stairs            Wheelchair Mobility    Modified Rankin (Stroke Patients Only)        Balance Overall balance assessment: Mild deficits observed, not formally tested                                           Pertinent Vitals/Pain Pain Assessment: No/denies pain    Home Living Family/patient expects to be discharged to:: Private residence Living Arrangements: Spouse/significant other;Children Available Help at Discharge: Family Type of Home: House         Home Equipment: None      Prior Function Level of Independence: Independent               Hand Dominance        Extremity/Trunk Assessment   Upper Extremity Assessment Upper Extremity Assessment: Overall WFL for tasks assessed    Lower Extremity Assessment Lower Extremity Assessment: Generalized weakness       Communication   Communication: No difficulties  Cognition Arousal/Alertness: Awake/alert Behavior During Therapy: WFL for tasks assessed/performed Overall Cognitive Status: Within Functional Limits for tasks assessed                                        General Comments      Exercises     Assessment/Plan    PT Assessment Patient needs continued PT services  PT Problem List Decreased mobility;Decreased strength       PT Treatment Interventions DME instruction;Gait  training;Stair training;Functional mobility training;Therapeutic activities;Therapeutic exercise;Patient/family education    PT Goals (Current goals can be found in the Care Plan section)  Acute Rehab PT Goals Patient Stated Goal: return home PT Goal Formulation: With patient Time For Goal Achievement: 10/16/19 Potential to Achieve Goals: Good    Frequency Min 3X/week   Barriers to discharge        Co-evaluation               AM-PAC PT "6 Clicks" Mobility  Outcome Measure Help needed turning from your back to your side while in a flat bed without using bedrails?: None Help needed moving from lying on your back to sitting on the side of a flat bed without  using bedrails?: A Little Help needed moving to and from a bed to a chair (including a wheelchair)?: None Help needed standing up from a chair using your arms (e.g., wheelchair or bedside chair)?: None Help needed to walk in hospital room?: A Little Help needed climbing 3-5 steps with a railing? : A Little 6 Click Score: 21    End of Session   Activity Tolerance: Patient tolerated treatment well Patient left: in chair;with call bell/phone within reach Nurse Communication: Mobility status PT Visit Diagnosis: Other abnormalities of gait and mobility (R26.89)    Time: 1322-1340 PT Time Calculation (min) (ACUTE ONLY): 18 min   Charges:   PT Evaluation $PT Eval Low Complexity: 1 Low          [place] Pager [phone] Office [place] 10/09/2019, 3:19 PM

## 2019-10-10 LAB — CBC WITH DIFFERENTIAL/PLATELET
Abs Immature Granulocytes: 1.77 10*3/uL — ABNORMAL HIGH (ref 0.00–0.07)
Basophils Absolute: 0.2 10*3/uL — ABNORMAL HIGH (ref 0.0–0.1)
Basophils Relative: 2 %
Eosinophils Absolute: 0 10*3/uL (ref 0.0–0.5)
Eosinophils Relative: 0 %
HCT: 30.7 % — ABNORMAL LOW (ref 36.0–46.0)
Hemoglobin: 10.2 g/dL — ABNORMAL LOW (ref 12.0–15.0)
Immature Granulocytes: 16 %
Lymphocytes Relative: 6 %
Lymphs Abs: 0.7 10*3/uL (ref 0.7–4.0)
MCH: 29.8 pg (ref 26.0–34.0)
MCHC: 33.2 g/dL (ref 30.0–36.0)
MCV: 89.8 fL (ref 80.0–100.0)
Monocytes Absolute: 1.1 10*3/uL — ABNORMAL HIGH (ref 0.1–1.0)
Monocytes Relative: 10 %
Neutro Abs: 7.7 10*3/uL (ref 1.7–7.7)
Neutrophils Relative %: 66 %
Platelets: 251 10*3/uL (ref 150–400)
RBC: 3.42 MIL/uL — ABNORMAL LOW (ref 3.87–5.11)
RDW: 15.2 % (ref 11.5–15.5)
WBC: 11.4 10*3/uL — ABNORMAL HIGH (ref 4.0–10.5)
nRBC: 0.4 % — ABNORMAL HIGH (ref 0.0–0.2)

## 2019-10-10 LAB — COMPREHENSIVE METABOLIC PANEL
ALT: 50 U/L — ABNORMAL HIGH (ref 0–44)
AST: 32 U/L (ref 15–41)
Albumin: 2.7 g/dL — ABNORMAL LOW (ref 3.5–5.0)
Alkaline Phosphatase: 139 U/L — ABNORMAL HIGH (ref 38–126)
Anion gap: 15 (ref 5–15)
BUN: 8 mg/dL (ref 6–20)
CO2: 20 mmol/L — ABNORMAL LOW (ref 22–32)
Calcium: 9.2 mg/dL (ref 8.9–10.3)
Chloride: 102 mmol/L (ref 98–111)
Creatinine, Ser: 0.72 mg/dL (ref 0.44–1.00)
GFR calc Af Amer: >60 mL/min (ref >60)
GFR calc non Af Amer: >60 mL/min (ref >60)
Glucose, Bld: 167 mg/dL — ABNORMAL HIGH (ref 70–99)
Potassium: 4.1 mmol/L (ref 3.5–5.1)
Sodium: 137 mmol/L (ref 135–145)
Total Bilirubin: 0.8 mg/dL (ref 0.3–1.2)
Total Protein: 5.5 g/dL — ABNORMAL LOW (ref 6.5–8.1)

## 2019-10-10 LAB — PHOSPHORUS: Phosphorus: 5.5 mg/dL — ABNORMAL HIGH (ref 2.5–4.6)

## 2019-10-10 LAB — MAGNESIUM: Magnesium: 2 mg/dL (ref 1.7–2.4)

## 2019-10-10 LAB — PROTIME-INR
INR: 1.1 (ref 0.8–1.2)
Prothrombin Time: 14.1 seconds (ref 11.4–15.2)

## 2019-10-10 MED ORDER — WARFARIN SODIUM 7.5 MG PO TABS
7.5000 mg | ORAL_TABLET | Freq: Once | ORAL | Status: AC
  Administered 2019-10-10: 7.5 mg via ORAL
  Filled 2019-10-10: qty 1

## 2019-10-10 MED ORDER — FUROSEMIDE 10 MG/ML IJ SOLN
20.0000 mg | Freq: Once | INTRAMUSCULAR | Status: AC
  Administered 2019-10-10: 20 mg via INTRAVENOUS
  Filled 2019-10-10: qty 2

## 2019-10-10 NOTE — Progress Notes (Signed)
Patient transferred to unit from ICU in wheelchair. Alert and oriented x4. Patient was able to walk from the wheelchair to the bed; also walk without a walker to the bathroom. She has a midline left arm, flushed and saline locked. Attached to telemetry box and pulse ox. Complete and assessment and VSS, room air. Slightly tachycardic - had been so at [facility] as well. Denies any pain or dyspnea. Patient oriented to routine and assisted as needed.

## 2019-10-10 NOTE — Progress Notes (Signed)
[place] for Warfarin Indication: pulmonary embolus   Patient Measurements: Height: 5\' 1"  (154.9 cm) Weight: 211 lb (95.7 kg) IBW/kg (Calculated) : 47.8 Heparin Dosing Weight: 71 kg  Vital Signs: Temp: 98.2 F (36.8 C) (12/30 0555) Temp Source: Oral (12/30 0555) BP: 107/56 (12/30 0555) Pulse Rate: 100 (12/30 0555)  Labs: Recent Labs    10/07/19 0941 10/08/19 0924 10/09/19 0500 10/09/19 1443 10/10/19 0311  HGB  --  10.3*  --  10.7* 10.2*  HCT  --  31.8*  --  32.9* 30.7*  PLT  --  PLATELET CLUMPS NOTED ON SMEAR, UNABLE TO ESTIMATE  --  277 251  LABPROT  --   --   --  13.5 14.1  INR  --   --   --  1.0 1.1  HEPARINUNFRC 0.48 0.47 0.35  --   --   CREATININE  --  0.74  --  0.81 0.72      Assessment: Pharmacy consulted to dose/monitor heparin in this 48 year old female with PMH significant for breast cancer on chemotherapy with curative intent. CT angio on 12/18 revealed acute bilateral PE, acute occlusive SVC thrombosis.  Transitioned from heparin to warfarin 12/29, No bridge since patient has recurrent hemorrhagic pericardial effusion.     INR subtherapeutic at 1.1 s/p D#1 warfarin start.  CBC stable, no bleeding reported.    Goal of Therapy:  INR 2-2.4 per MD request Monitor platelets by anticoagulation protocol: Yes   Plan:   Warfarin 7.5mg  PO x 1 today Daily INR, s/s bleeding  Bertis Ruddy, PharmD Clinical Pharmacist Please check AMION for all [place] numbers 10/10/2019 9:00 AM

## 2019-10-10 NOTE — Progress Notes (Signed)
8 Days Post-Op Procedure(s) (LRB): PERCUTANEOUS  SVC THROMBECTOMY USING INARI (N/A) Central VENOGRAM (N/A) Intravascular Ultrasound (N/A) Removal Port-A-Cath Insertion Central Line Adult Ultrasound Guidance For Vascular Access Pericardial Window (N/A) Subjective: Patient doing well out of ICU in progressive care Sitting up in chair breathing comfortably on room air Some drainage from subxiphoid incision consistent with fat necrosis without surrounding cellulitis-dressing personally changed.  Chest tube suture intact.  Objective: Vital signs in last 24 hours: Temp:  [98.1 F (36.7 C)-98.8 F (37.1 C)] 98.3 F (36.8 C) (12/30 0800) Pulse Rate:  [97-109] 97 (12/30 0800) Cardiac Rhythm: Sinus tachycardia (12/30 0800) Resp:  [18-20] 18 (12/30 0800) BP: (101-132)/(56-97) 101/71 (12/30 0800) SpO2:  [97 %-100 %] 97 % (12/30 0800)  Hemodynamic parameters for last 24 hours:    Intake/Output from previous day: 12/29 0701 - 12/30 0700 In: 1555.1 [P.O.:1490; I.V.:65.1] Out: 2 [Stool:2] Intake/Output this shift: Total I/O In: 120 [P.O.:120] Out: -   No evidence of recurrent tamponade on exam Subxiphoid incision improved but still with minimal fat necrosis drainage Lab Results: Recent Labs    10/09/19 1443 10/10/19 0311  WBC 15.7* 11.4*  HGB 10.7* 10.2*  HCT 32.9* 30.7*  PLT 277 251   BMET:  Recent Labs    10/09/19 1443 10/10/19 0311  NA 135 137  K 4.0 4.1  CL 101 102  CO2 23 20*  GLUCOSE 198* 167*  BUN 9 8  CREATININE 0.81 0.72  CALCIUM 9.2 9.2    PT/INR:  Recent Labs    10/10/19 0311  LABPROT 14.1  INR 1.1   ABG    Component Value Date/Time   PHART 7.338 (L) 10/02/2019 2017   HCO3 21.9 10/02/2019 2017   TCO2 23 10/02/2019 2017   ACIDBASEDEF 4.0 (H) 10/02/2019 2017   O2SAT 100.0 10/02/2019 2017   CBG (last 3)  No results for input(s): GLUCAP in the last 72 hours.  Assessment/Plan: S/P Procedure(s) (LRB): PERCUTANEOUS  SVC THROMBECTOMY USING INARI  (N/A) Central VENOGRAM (N/A) Intravascular Ultrasound (N/A) Removal Port-A-Cath Insertion Central Line Adult Ultrasound Guidance For Vascular Access Pericardial Window (N/A) Continue with Coumadin for anticoagulation and follow-up limited echocardiogram in 5 days to reexamine recurrent pericardial effusion Daily dressing changes as above  LOS: 12 days    Tharon Aquas Trigt III 10/10/2019

## 2019-10-10 NOTE — Progress Notes (Signed)
PROGRESS NOTE    Veronica [name]  [id number] DOB: Dec 05, 1970 DOA: 09/28/2019 PCP: Blair Heys, PA-C    Brief Narrative:  48 yo female admitted with PE and SVC syndrome with hx of breast cancer.  Had thrombectomy on [id number] complicated by hemopericardium and tamponade requiring subxyphoid pericardial window.  Incidentally found to be positive for COVID 19   Assessment & Plan:   Principal Problem:   Multiple subsegmental pulmonary emboli without acute cor pulmonale (HCC) Active Problems:   Malignant neoplasm of upper-outer quadrant of right breast in female, estrogen receptor positive (HCC)   Thrombocytopenia (HCC)   Superior vena cava thrombosis (HCC)   SVCO (superior vena cava obstruction)   SVC syndrome   Sinus tachycardia   Essential hypertension   Gastroesophageal reflux disease  1 acute PE with acute SVC syndrome status post mechanical thrombectomy complicated by hemopericardium with tamponade status post xiphoid window. Pericardial drain removed by cardiothoracic surgery on 10/07/2019. Patient being followed by vascular and cardiothoracic surgery.  Repeat 2D echo with a recurrent small pericardial effusion with normal EF.  Patient was on heparin which has been discontinued by cardiothoracic surgery and patient started on Coumadin with goal INR 2-2.4.  Continue Keflex for cellulitis at incision area.  Per cardiothoracic surgery.  2.  Sinus tachycardia/history of hypertension Blood pressure stable.  Patient still somewhat tachycardic however improving.  Continue Lopressor and uptitrate as needed for tachycardia.  Continue to hold Benicar due to borderline blood pressure.  Follow.  3.  Atelectasis, bilateral pleural effusions/bilateral lower extremity edema Continue low-dose Lasix.  Incentive spirometry.  Patient with bilateral lower extremity edema however denies any shortness of breath or chest pain.  Patient noted to have DVTs.  Patient with borderline blood pressure and as  such unable to give IV Lasix at this time.  If blood pressure improves we will give a dose of Lasix 20 mg IV x1.  Follow.  4.  COVID-19 infection with initial concern for COVID-19 pneumonia Status post full course of remdesivir.  Patient improved clinically.  Patient with sats of 97% on room air.  CRP trending down.  Ferritin elevated.  Follow.   5.  Chemotherapy-induced pancytopenia/A ER positive breast cancer Pancytopenia improved.  Hemoglobin currently at 10.2, platelet count at 251.  Patient denies any overt bleeding.  Per oncology transfuse for hemoglobin less than 8 and platelet count less than 20.  Oncology following and appreciate input and recommendations.  6.  History of gastroesophageal reflux disease Continue Pepcid.   DVT prophylaxis: Coumadin Code Status: Full Family Communication: Updated patient.  No family at bedside. Disposition Plan: Home when clinically improved, stable, INR therapeutic and when cleared by cardiothoracic surgery.   Consultants:  Vascular, TCTS, Oncology  Procedures:  CTA chest 12/18 > bilateral lower lobe PE, SVC occlusion, probable RLL infarct, small b/l effusions. CT head 12/18 > no acute findings. Echo 12/20 > EF 60-65%, small pericardial effusion. Echo 12/22 (post thrombectomy) >significantly reduced clot burden in the SVC with improved flow on the venogram. Large expanding circumferential pericardial effusion with evidence of tamponade. Echo 12/22 (post pericardial window) > resolution of pericardial effusion without evidence of tamponade, LVEF > 65%, small clot burden in SVC. ETT 12/22 >12/23 L radial art line 12/22 > Right femoral CVL >> 12/25 2D echo 10/08/2019  Significant Hospital Events   12/18 admit. 12/22 to OR for thrombectomy which was complicated bylarge hemopericardium with tamponade requiring subxyphoid pericardial window. 12/23 extubated 12/25 add lopressor for tachycardia 12/27 pericardial drain  removed.     Antimicrobials:  Remdesivir 12/21 >> 12/25 Keflex 10/07/2019>>> 10/14/2019  Micro Data:  SARS CoV2 12/18 > positive.   Subjective: Patient sitting up on the side of the bed.  Denies any shortness of breath.  No chest pain.  Denies any abdominal pain.  Denies any bleeding.  Complains of bilateral lower extremity edema.    Objective: Vitals:   10/09/19 1600 10/09/19 2010 10/09/19 2325 10/10/19 0555  BP:  (!) 132/97 121/76 (!) 107/56  Pulse:   (!) 109 100  Resp:   20 19  Temp: 98.1 F (36.7 C) 98.2 F (36.8 C) 98.8 F (37.1 C) 98.2 F (36.8 C)  TempSrc:  Oral Oral Oral  SpO2:   100% 97%  Weight:      Height:        Intake/Output Summary (Last 24 hours) at 10/10/2019 1140 Last data filed at 10/10/2019 1100 Gross per 24 hour  Intake 1030 ml  Output 1 ml  Net 1029 ml   Filed Weights   09/28/19 1659  Weight: 95.7 kg    Examination:  General exam: NAD Respiratory system: Clear to auscultation bilaterally.  No wheezes, no crackles, no rhonchi.  Speaking in full sentences.  Normal respiratory effort. Cardiovascular system: Tachycardia.  No JVD, no murmurs rubs or gallops.  2+ bilateral lower extremity edema.  Subxiphoid area with bandage. Gastrointestinal system: Abdomen is soft, nontender, nondistended, positive bowel sounds.  No rebound.  No guarding.  Central nervous system: Alert and oriented. No focal neurological deficits. Extremities: Symmetric 5 x 5 power. Skin: No rashes, lesions or ulcers Psychiatry: Judgement and insight appear normal. Mood & affect appropriate.     Data Reviewed: I have personally reviewed following labs and imaging studies  CBC: Recent Labs  Lab 10/04/19 0454 10/05/19 0230 10/06/19 1115 10/08/19 0924 10/09/19 1443 10/10/19 0311  WBC 2.4* 4.6 7.4 13.1* 15.7* 11.4*  NEUTROABS 1.5* 3.2 5.8  --  13.0* 7.7  HGB 11.0* 11.4* 10.9* 10.3* 10.7* 10.2*  HCT 33.0* 33.6* 32.9* 31.8* 32.9* 30.7*  MCV 90.2 89.4 89.6 91.6 91.4 89.8  PLT  39* 47* 89* PLATELET CLUMPS NOTED ON SMEAR, UNABLE TO ESTIMATE 277 [id number]   Basic Metabolic Panel: Recent Labs  Lab 10/04/19 0454 10/05/19 0230 10/08/19 0924 10/09/19 1443 10/10/19 0311  NA 133* 134* 135 135 137  K 3.6 3.8 3.6 4.0 4.1  CL 102 103 102 101 102  CO2 20* 21* 21* 23 20*  GLUCOSE 185* 191* 182* 198* 167*  BUN 7 9 <5* 9 8  CREATININE 0.90 0.77 0.74 0.81 0.72  CALCIUM 9.1 8.9 8.4* 9.2 9.2  MG 1.9 1.9  --  1.9 2.0  PHOS  --   --   --  5.0* 5.5*   GFR: Estimated Creatinine Clearance: 92.9 mL/min (by C-G formula based on SCr of 0.72 mg/dL). Liver Function Tests: Recent Labs  Lab 10/04/19 0454 10/05/19 0230 10/09/19 1443 10/10/19 0311  AST 14* 14* 52* 32  ALT 38 27 56* 50*  ALKPHOS 62 73 140* 139*  BILITOT 1.4* 1.4* 1.0 0.8  PROT 5.0* 5.0* 5.9* 5.5*  ALBUMIN 3.2* 2.8* 2.8* 2.7*   No results for input(s): LIPASE, AMYLASE in the last 168 hours. No results for input(s): AMMONIA in the last 168 hours. Coagulation Profile: Recent Labs  Lab 10/06/19 1117 10/09/19 1443 10/10/19 0311  INR 1.1 1.0 1.1   Cardiac Enzymes: No results for input(s): CKTOTAL, CKMB, CKMBINDEX, TROPONINI in the last 168 hours. BNP (last  3 results) No results for input(s): PROBNP in the last 8760 hours. HbA1C: No results for input(s): HGBA1C in the last 72 hours. CBG: No results for input(s): GLUCAP in the last 168 hours. Lipid Profile: No results for input(s): CHOL, HDL, LDLCALC, TRIG, CHOLHDL, LDLDIRECT in the last 72 hours. Thyroid Function Tests: No results for input(s): TSH, T4TOTAL, FREET4, T3FREE, THYROIDAB in the last 72 hours. Anemia Panel: Recent Labs    10/09/19 1443  FERRITIN 1,470*   Sepsis Labs: No results for input(s): PROCALCITON, LATICACIDVEN in the last 168 hours.  Recent Results (from the past 240 hour(s))  MRSA PCR Screening     Status: None   Collection Time: 10/02/19  8:57 PM   Specimen: Nasal Mucosa; Nasopharyngeal  Result Value Ref Range Status   MRSA  by PCR NEGATIVE NEGATIVE Final    Comment:        The GeneXpert MRSA Assay (FDA approved for NASAL specimens only), is one component of a comprehensive MRSA colonization surveillance program. It is not intended to diagnose MRSA infection nor to guide or monitor treatment for MRSA infections. Performed at [facility], [place] [address] [place], [place] [address]          Radiology Studies: DG Chest Port 1 View  Result Date: 10/09/2019 CLINICAL DATA:  Pleural effusion. EXAM: PORTABLE CHEST 1 VIEW COMPARISON:  Radiograph 10/06/2019. Chest CT 09/28/2019 FINDINGS: Hazy lung base opacities are not significantly changed from prior exam, likely combination of pleural fluid and atelectasis. Unchanged heart size and mediastinal contours. Pericardial drainage catheter on prior exam is not well visualized currently. No visualized pneumothorax. IMPRESSION: 1. Unchanged hazy lung base opacities, likely combination of pleural fluid and atelectasis. 2. Unchanged mediastinal contours and heart size after removal of pericardial drainage catheter. Electronically Signed   By: Keith Rake M.D.   On: 10/09/2019 06:32   ECHOCARDIOGRAM LIMITED  Result Date: 10/08/2019   ECHOCARDIOGRAM LIMITED REPORT   Patient Name:   AZALEAH ALARID Date of Exam: 10/08/2019 Medical Rec #:  [id number]      Height:       61.0 in Accession #:    [id number]     Weight:       211.0 lb Date of Birth:  Sep 06, 1971      BSA:          1.93 m Patient Age:    60 years       BP:           117/80 mmHg Patient Gender: F              HR:           109 bpm. Exam Location:  Inpatient  Procedure: Limited Echo, Cardiac Doppler and Color Doppler Indications:    I31.3 Pericardial effusion (noninflammatory)  History:        Patient has prior history of Echocardiogram examinations, most                 recent 10/03/2019. Pericardial effusion. Cancer. Chemo.                 Tamponade. Covid 19 positive.  Sonographer:    Roseanna Rainbow RDCS Referring  Phys: [id number] PETER VAN TRIGT  Sonographer Comments: Technically difficult study due to poor echo windows, suboptimal subcostal window, no subcostal window and patient is morbidly obese. Image acquisition challenging due to patient body habitus. Abdominal dressing in subcostal region. IMPRESSIONS  1. Left ventricular ejection fraction, by visual  estimation, is 60 to 65%.  2. Small to moderate, circumferential but mostly posterior pericardial effusion.  3. The pericardial effusion is posterior to the left ventricle.  4. The inferior vena cava is dilated in size with <50% respiratory variability, suggesting right atrial pressure of 15 mmHg.  5. Unable to directly compare prior study on 10/03/19 due to suboptimal images, however, in comparison to TEE images during pericardial window, there appears to be a larger pericardial effusion FINDINGS  Left Ventricle: Left ventricular ejection fraction, by visual estimation, is 60 to 65%. Pericardium: A small pericardial effusion is present is seen. A small pericardial effusion is present. The pericardial effusion is posterior to the left ventricle. Venous: The inferior vena cava is dilated in size with less than 50% respiratory variability, suggesting right atrial pressure of 15 mmHg.  LEFT VENTRICLE         Normals PLAX 2D LVIDd:         3.45 cm 3.6 cm LVIDs:         2.50 cm 1.7 cm LV PW:         1.15 cm 1.4 cm LV IVS:        1.10 cm 1.3 cm LV SV:         27 ml   79 ml LV SV Index:   12.88   45 ml/m2  at IVC IVC diam: 2.20 cm LEFT ATRIUM         Index LA diam:    3.00 cm 1.55 cm/m   AORTA                 Normals Ao Root diam: 2.75 cm 31 mm  Lyman Bishop MD Electronically signed by Lyman Bishop MD Signature Date/Time: 10/08/2019/5:07:46 PM    Final         Scheduled Meds: . cephALEXin  500 mg Oral Q8H  . Chlorhexidine Gluconate Cloth  6 each Topical Daily  . famotidine  10 mg Oral BID  . feeding supplement (ENSURE ENLIVE)  237 mL Oral TID BM  . feeding supplement  (PRO-STAT SUGAR FREE 64)  30 mL Oral BID  . furosemide  10 mg Oral Daily  . mouth rinse  15 mL Mouth Rinse BID  . metoprolol tartrate  25 mg Oral BID  . sodium chloride flush  10-40 mL Intracatheter Q12H  . warfarin  7.5 mg Oral ONCE-1800  . Warfarin - Pharmacist Dosing Inpatient   Does not apply q1800   Continuous Infusions: . sodium chloride Stopped (10/05/19 0829)     LOS: 12 days    Time spent: 40 minutes    Irine Seal, MD [facility]  If 7PM-7AM, please contact night-coverage [email or web address] 10/10/2019, 11:40 AM

## 2019-10-10 NOTE — Progress Notes (Signed)
[facility]  Subjective  -feels that her facial and upper arm swelling have improved.  Heparin's been held due to reaccumulation of small pericardial effusion.  She is on Coumadin now.   Objective 101/71 97 98.3 F (36.8 C) (Oral) 18 97%  Intake/Output Summary (Last 24 hours) at 10/10/2019 1653 Last data filed at 10/10/2019 1600 Gross per 24 hour  Intake 1030 ml  Output 1 ml  Net 1029 ml    Continued swelling to bilateral upper extremities but does look improved, minimal facial swelling  Laboratory Lab Results: Recent Labs    10/09/19 1443 10/10/19 0311  WBC 15.7* 11.4*  HGB 10.7* 10.2*  HCT 32.9* 30.7*  PLT 277 251   BMET Recent Labs    10/09/19 1443 10/10/19 0311  NA 135 137  K 4.0 4.1  CL 101 102  CO2 23 20*  GLUCOSE 198* 167*  BUN 9 8  CREATININE 0.81 0.72  CALCIUM 9.2 9.2    COAG Lab Results  Component Value Date   INR 1.1 10/10/2019   INR 1.0 10/09/2019   INR 1.1 10/06/2019   No results found for: PTT  Assessment/Planning:  Discussed with Dr. Prescott Gum this morning plan for repeat echocardiogram given reccurrent small pericardial effusion.  Can continue Coumadin for anticoagulation while holding heparin.  Updated her on the plan of care.  She feels her upper extremity swelling is improving still.  Marty Heck 10/10/2019 4:53 PM --

## 2019-10-11 ENCOUNTER — Other Ambulatory Visit: Payer: Self-pay | Admitting: Oncology

## 2019-10-11 DIAGNOSIS — C50411 Malignant neoplasm of upper-outer quadrant of right female breast: Secondary | ICD-10-CM

## 2019-10-11 LAB — CBC WITH DIFFERENTIAL/PLATELET
Abs Immature Granulocytes: 0.7 10*3/uL — ABNORMAL HIGH (ref 0.00–0.07)
Basophils Absolute: 0.1 10*3/uL (ref 0.0–0.1)
Basophils Relative: 1 %
Eosinophils Absolute: 0.1 10*3/uL (ref 0.0–0.5)
Eosinophils Relative: 1 %
HCT: 30.3 % — ABNORMAL LOW (ref 36.0–46.0)
Hemoglobin: 9.9 g/dL — ABNORMAL LOW (ref 12.0–15.0)
Lymphocytes Relative: 2 %
Lymphs Abs: 0.2 10*3/uL — ABNORMAL LOW (ref 0.7–4.0)
MCH: 29.6 pg (ref 26.0–34.0)
MCHC: 32.7 g/dL (ref 30.0–36.0)
MCV: 90.7 fL (ref 80.0–100.0)
Metamyelocytes Relative: 2 %
Monocytes Absolute: 0.5 10*3/uL (ref 0.1–1.0)
Monocytes Relative: 4 %
Myelocytes: 4 %
Neutro Abs: 10.2 10*3/uL — ABNORMAL HIGH (ref 1.7–7.7)
Neutrophils Relative %: 86 %
Platelets: 300 10*3/uL (ref 150–400)
RBC: 3.34 MIL/uL — ABNORMAL LOW (ref 3.87–5.11)
RDW: 15.2 % (ref 11.5–15.5)
WBC: 11.9 10*3/uL — ABNORMAL HIGH (ref 4.0–10.5)
nRBC: 0 /100 WBC
nRBC: 0.5 % — ABNORMAL HIGH (ref 0.0–0.2)

## 2019-10-11 LAB — MAGNESIUM: Magnesium: 2 mg/dL (ref 1.7–2.4)

## 2019-10-11 LAB — COMPREHENSIVE METABOLIC PANEL
ALT: 46 U/L — ABNORMAL HIGH (ref 0–44)
AST: 27 U/L (ref 15–41)
Albumin: 2.7 g/dL — ABNORMAL LOW (ref 3.5–5.0)
Alkaline Phosphatase: 133 U/L — ABNORMAL HIGH (ref 38–126)
Anion gap: 11 (ref 5–15)
BUN: 9 mg/dL (ref 6–20)
CO2: 23 mmol/L (ref 22–32)
Calcium: 9.2 mg/dL (ref 8.9–10.3)
Chloride: 103 mmol/L (ref 98–111)
Creatinine, Ser: 0.85 mg/dL (ref 0.44–1.00)
GFR calc Af Amer: >60 mL/min (ref >60)
GFR calc non Af Amer: >60 mL/min (ref >60)
Glucose, Bld: 167 mg/dL — ABNORMAL HIGH (ref 70–99)
Potassium: 4 mmol/L (ref 3.5–5.1)
Sodium: 137 mmol/L (ref 135–145)
Total Bilirubin: 1 mg/dL (ref 0.3–1.2)
Total Protein: 5.7 g/dL — ABNORMAL LOW (ref 6.5–8.1)

## 2019-10-11 LAB — FERRITIN: Ferritin: 1015 ng/mL — ABNORMAL HIGH (ref 11–307)

## 2019-10-11 LAB — PATHOLOGIST SMEAR REVIEW

## 2019-10-11 LAB — C-REACTIVE PROTEIN: CRP: 5.4 mg/dL — ABNORMAL HIGH (ref <1.0)

## 2019-10-11 LAB — PHOSPHORUS: Phosphorus: 5.4 mg/dL — ABNORMAL HIGH (ref 2.5–4.6)

## 2019-10-11 LAB — PROTIME-INR
INR: 1.1 (ref 0.8–1.2)
Prothrombin Time: 14.3 seconds (ref 11.4–15.2)

## 2019-10-11 LAB — D-DIMER, QUANTITATIVE: D-Dimer, Quant: 7.67 ug/mL-FEU — ABNORMAL HIGH (ref 0.00–0.50)

## 2019-10-11 MED ORDER — WARFARIN SODIUM 7.5 MG PO TABS
7.5000 mg | ORAL_TABLET | Freq: Once | ORAL | Status: AC
  Administered 2019-10-11: 7.5 mg via ORAL
  Filled 2019-10-11: qty 1

## 2019-10-11 NOTE — Progress Notes (Signed)
9 Days Post-Op Procedure(s) (LRB): PERCUTANEOUS  SVC THROMBECTOMY USING INARI (N/A) Central VENOGRAM (N/A) Intravascular Ultrasound (N/A) Removal Port-A-Cath Insertion Central Line Adult Ultrasound Guidance For Vascular Access Pericardial Window (N/A) Subjective: Afebrile, NSR 98, BP normal Minimal subxyphoid  incision fat necrosis drainage- cont po Keflex another 72 hrs. WBC stable ar 11k Coumadin dosing by PHAarmD- no heparin bridge for upper extremity DVT with some recurrent pericardial effusion by echo after emergency pericardial window for tamponade, shock  Objective: Vital signs in last 24 hours: Temp:  [98.4 F (36.9 C)-98.6 F (37 C)] 98.4 F (36.9 C) (12/31 0530) Pulse Rate:  [99-107] 104 (12/31 0847) Cardiac Rhythm: Normal sinus rhythm (12/31 0808) Resp:  [19] 19 (12/30 1706) BP: (109-147)/(77-114) 109/77 (12/31 0530) SpO2:  [94 %-100 %] 100 % (12/31 0530)  Hemodynamic parameters for last 24 hours:  stable  Intake/Output from previous day: 12/30 0701 - 12/31 0700 In: 46 [P.O.:480; I.V.:10] Out: 0  Intake/Output this shift: No intake/output data recorded.    Lab Results: Recent Labs    10/10/19 0311 10/11/19 0500  WBC 11.4* 11.9*  HGB 10.2* 9.9*  HCT 30.7* 30.3*  PLT 251 300   BMET:  Recent Labs    10/10/19 0311 10/11/19 0500  NA 137 137  K 4.1 4.0  CL 102 103  CO2 20* 23  GLUCOSE 167* 167*  BUN 8 9  CREATININE 0.72 0.85  CALCIUM 9.2 9.2    PT/INR:  Recent Labs    10/11/19 0707  LABPROT 14.3  INR 1.1   ABG    Component Value Date/Time   PHART 7.338 (L) 10/02/2019 2017   HCO3 21.9 10/02/2019 2017   TCO2 23 10/02/2019 2017   ACIDBASEDEF 4.0 (H) 10/02/2019 2017   O2SAT 100.0 10/02/2019 2017   CBG (last 3)  No results for input(s): GLUCAP in the last 72 hours.  Assessment/Plan: S/P Procedure(s) (LRB): PERCUTANEOUS  SVC THROMBECTOMY USING INARI (N/A) Central VENOGRAM (N/A) Intravascular Ultrasound (N/A) Removal  Port-A-Cath Insertion Central Line Adult Ultrasound Guidance For Vascular Access Pericardial Window (N/A)   Covid 19 + but on room air Pericardial window for hemopericardium, tamponade 12-22 Recurrent 1.9 cm posterior pericardial effusion on echocardiogram 12-27 SVC syndrome from thrombus Cont coumadin loading, avoid heparin  Will get followup echocardiogram 1-4 to check effusion  LOS: 13 days    Veronica [name] 10/11/2019

## 2019-10-11 NOTE — Progress Notes (Signed)
PROGRESS NOTE    Veronica [name]  [id number] DOB: May 08, 1971 DOA: 09/28/2019 PCP: Blair Heys, PA-C    Brief Narrative:  48 yo female admitted with PE and SVC syndrome with hx of breast cancer.  Had thrombectomy on [id number] complicated by hemopericardium and tamponade requiring subxyphoid pericardial window.  Incidentally found to be positive for COVID 19   Assessment & Plan:   Principal Problem:   Multiple subsegmental pulmonary emboli without acute cor pulmonale (HCC) Active Problems:   Malignant neoplasm of upper-outer quadrant of right breast in female, estrogen receptor positive (HCC)   Thrombocytopenia (HCC)   Superior vena cava thrombosis (HCC)   SVCO (superior vena cava obstruction)   SVC syndrome   Sinus tachycardia   Essential hypertension   Gastroesophageal reflux disease  1 acute PE with acute SVC syndrome status post mechanical thrombectomy complicated by hemopericardium with tamponade status post xiphoid window. Pericardial drain removed by cardiothoracic surgery on 10/07/2019. Patient being followed by vascular and cardiothoracic surgery.  Repeat 2D echo with a recurrent small pericardial effusion with normal EF.  Patient was on heparin which has been discontinued by cardiothoracic surgery and patient started on Coumadin with goal INR 2-2.4.  Continue Keflex for cellulitis at incision area.  Per cardiothoracic surgery repeat 2D echo in about 4 days to follow-up pericardial effusion.  Per cardiothoracic surgery.   2.  Sinus tachycardia/history of hypertension Blood pressure stable.  Patient with improvement with tachycardia. Continue Lopressor and uptitrate as needed for tachycardia.  Continue to hold Benicar due to borderline blood pressure.  Follow.  3.  Atelectasis, bilateral pleural effusions/bilateral lower extremity edema Continue low-dose Lasix.  Incentive spirometry.  Patient with bilateral lower extremity edema however denies any shortness of breath or chest  pain.  Patient noted to have DVTs.  Patient with borderline blood pressure and as such unable to give IV Lasix at this time.  Continue current dose of oral Lasix.  Follow.   4.  COVID-19 infection with initial concern for COVID-19 pneumonia Status post full course of remdesivir.  Patient improved clinically.  Patient with sats of 97% on room air.  CRP trending down.  Ferritin elevated.  Follow.   5.  Chemotherapy-induced pancytopenia/A ER positive breast cancer Pancytopenia improved.  Hemoglobin currently at 9.9, platelet count at 300.  Patient denies any overt bleeding.  Per oncology transfuse for hemoglobin < 8 and platelet count  < 20.  Oncology following and appreciate input and recommendations.  6.  History of gastroesophageal reflux disease Pepcid.     DVT prophylaxis: Coumadin Code Status: Full Family Communication: Updated patient.  No family at bedside. Disposition Plan: Home when clinically improved, stable, INR therapeutic and when cleared by cardiothoracic surgery.   Consultants:  Vascular, TCTS, Oncology  Procedures:  CTA chest 12/18 > bilateral lower lobe PE, SVC occlusion, probable RLL infarct, small b/l effusions. CT head 12/18 > no acute findings. Echo 12/20 > EF 60-65%, small pericardial effusion. Echo 12/22 (post thrombectomy) >significantly reduced clot burden in the SVC with improved flow on the venogram. Large expanding circumferential pericardial effusion with evidence of tamponade. Echo 12/22 (post pericardial window) > resolution of pericardial effusion without evidence of tamponade, LVEF > 65%, small clot burden in SVC. ETT 12/22 >12/23 L radial art line 12/22 > Right femoral CVL >> 12/25 2D echo 10/08/2019  Significant Hospital Events   12/18 admit. 12/22 to OR for thrombectomy which was complicated bylarge hemopericardium with tamponade requiring subxyphoid pericardial window. 12/23 extubated 12/25 add  lopressor for tachycardia 12/27 pericardial  drain removed.    Antimicrobials:  Remdesivir 12/21 >> 12/25 Keflex 10/07/2019>>> 10/14/2019  Micro Data:  SARS CoV2 12/18 > positive.   Subjective: Patient sitting up in chair.  Denies any shortness of breath.  Still with lower extremity edema.  Feels upper extremity edema slowly improving.  Denies any bleeding.   Objective: Vitals:   10/10/19 2157 10/10/19 2307 10/11/19 0530 10/11/19 0847  BP:   109/77   Pulse:   99 (!) 104  Resp:      Temp:   98.4 F (36.9 C)   TempSrc:   Oral   SpO2: 98% 94% 100%   Weight:      Height:        Intake/Output Summary (Last 24 hours) at 10/11/2019 1029 Last data filed at 10/10/2019 2212 Gross per 24 hour  Intake 490 ml  Output 0 ml  Net 490 ml   Filed Weights   09/28/19 1659  Weight: 95.7 kg    Examination:  General exam: NAD Respiratory system: Lungs clear to auscultation bilaterally.  No wheezes, no crackles, no rhonchi.  Normal respiratory effort.   Cardiovascular system: Rate rhythm no murmurs rubs or gallops.  2+ bilateral lower extremity edema.  Bilateral upper extremity swelling improved.  Subxiphoid area with bandage. Gastrointestinal system: Abdomen is nontender, nondistended, soft, positive bowel sounds.  No rebound.  No guarding. Central nervous system: Alert and oriented. No focal neurological deficits. Extremities: Symmetric 5 x 5 power. Skin: No rashes, lesions or ulcers Psychiatry: Judgement and insight appear normal. Mood & affect appropriate.     Data Reviewed: I have personally reviewed following labs and imaging studies  CBC: Recent Labs  Lab 10/05/19 0230 10/06/19 1115 10/08/19 0924 10/09/19 1443 10/10/19 0311 10/11/19 0500  WBC 4.6 7.4 13.1* 15.7* 11.4* 11.9*  NEUTROABS 3.2 5.8  --  13.0* 7.7 10.2*  HGB 11.4* 10.9* 10.3* 10.7* 10.2* 9.9*  HCT 33.6* 32.9* 31.8* 32.9* 30.7* 30.3*  MCV 89.4 89.6 91.6 91.4 89.8 90.7  PLT 47* 89* PLATELET CLUMPS NOTED ON SMEAR, UNABLE TO ESTIMATE 277 251 [id number]    Basic Metabolic Panel: Recent Labs  Lab 10/05/19 0230 10/08/19 0924 10/09/19 1443 10/10/19 0311 10/11/19 0500  NA 134* 135 135 137 137  K 3.8 3.6 4.0 4.1 4.0  CL 103 102 101 102 103  CO2 21* 21* 23 20* 23  GLUCOSE 191* 182* 198* 167* 167*  BUN 9 <5* 9 8 9   CREATININE 0.77 0.74 0.81 0.72 0.85  CALCIUM 8.9 8.4* 9.2 9.2 9.2  MG 1.9  --  1.9 2.0 2.0  PHOS  --   --  5.0* 5.5* 5.4*   GFR: Estimated Creatinine Clearance: 87.5 mL/min (by C-G formula based on SCr of 0.85 mg/dL). Liver Function Tests: Recent Labs  Lab 10/05/19 0230 10/09/19 1443 10/10/19 0311 10/11/19 0500  AST 14* 52* 32 27  ALT 27 56* 50* 46*  ALKPHOS 73 140* 139* 133*  BILITOT 1.4* 1.0 0.8 1.0  PROT 5.0* 5.9* 5.5* 5.7*  ALBUMIN 2.8* 2.8* 2.7* 2.7*   No results for input(s): LIPASE, AMYLASE in the last 168 hours. No results for input(s): AMMONIA in the last 168 hours. Coagulation Profile: Recent Labs  Lab 10/06/19 1117 10/09/19 1443 10/10/19 0311 10/11/19 0707  INR 1.1 1.0 1.1 1.1   Cardiac Enzymes: No results for input(s): CKTOTAL, CKMB, CKMBINDEX, TROPONINI in the last 168 hours. BNP (last 3 results) No results for input(s): PROBNP in the last 8760  hours. HbA1C: No results for input(s): HGBA1C in the last 72 hours. CBG: No results for input(s): GLUCAP in the last 168 hours. Lipid Profile: No results for input(s): CHOL, HDL, LDLCALC, TRIG, CHOLHDL, LDLDIRECT in the last 72 hours. Thyroid Function Tests: No results for input(s): TSH, T4TOTAL, FREET4, T3FREE, THYROIDAB in the last 72 hours. Anemia Panel: Recent Labs    10/09/19 1443 10/11/19 0500  FERRITIN 1,470* 1,015*   Sepsis Labs: No results for input(s): PROCALCITON, LATICACIDVEN in the last 168 hours.  Recent Results (from the past 240 hour(s))  MRSA PCR Screening     Status: None   Collection Time: 10/02/19  8:57 PM   Specimen: Nasal Mucosa; Nasopharyngeal  Result Value Ref Range Status   MRSA by PCR NEGATIVE NEGATIVE Final     Comment:        The GeneXpert MRSA Assay (FDA approved for NASAL specimens only), is one component of a comprehensive MRSA colonization surveillance program. It is not intended to diagnose MRSA infection nor to guide or monitor treatment for MRSA infections. Performed at [facility], [place] [address], [place], [place] [address]          Radiology Studies: No results found.      Scheduled Meds: . cephALEXin  500 mg Oral Q8H  . Chlorhexidine Gluconate Cloth  6 each Topical Daily  . famotidine  10 mg Oral BID  . feeding supplement (ENSURE ENLIVE)  237 mL Oral TID BM  . feeding supplement (PRO-STAT SUGAR FREE 64)  30 mL Oral BID  . furosemide  10 mg Oral Daily  . mouth rinse  15 mL Mouth Rinse BID  . metoprolol tartrate  25 mg Oral BID  . sodium chloride flush  10-40 mL Intracatheter Q12H  . warfarin  7.5 mg Oral ONCE-1800  . Warfarin - Pharmacist Dosing Inpatient   Does not apply q1800   Continuous Infusions: . sodium chloride Stopped (10/05/19 0829)     LOS: 13 days    Time spent: 40 minutes    Irine Seal, MD [facility]  If 7PM-7AM, please contact night-coverage [email or web address] 10/11/2019, 10:29 AM

## 2019-10-11 NOTE — Progress Notes (Signed)
[place] for Warfarin Indication: pulmonary embolus   Patient Measurements: Height: 5\' 1"  (154.9 cm) Weight: 211 lb (95.7 kg) IBW/kg (Calculated) : 47.8 Heparin Dosing Weight: 71 kg  Vital Signs: Temp: 98.4 F (36.9 C) (12/31 0530) Temp Source: Oral (12/31 0530) BP: 109/77 (12/31 0530) Pulse Rate: 99 (12/31 0530)  Labs: Recent Labs    10/08/19 0924 10/09/19 0500 10/09/19 1443 10/10/19 0311 10/11/19 0500 10/11/19 0707  HGB 10.3*  --  10.7* 10.2* 9.9*  --   HCT 31.8*  --  32.9* 30.7* 30.3*  --   PLT PLATELET CLUMPS NOTED ON SMEAR, UNABLE TO ESTIMATE  --  277 251 300  --   LABPROT  --   --  13.5 14.1  --  14.3  INR  --   --  1.0 1.1  --  1.1  HEPARINUNFRC 0.47 0.35  --   --   --   --   CREATININE 0.74  --  0.81 0.72 0.85  --       Assessment: Pharmacy consulted to dose/monitor heparin in this 48 year old female with PMH significant for breast cancer on chemotherapy with curative intent. CT angio on 12/18 revealed acute bilateral PE, acute occlusive SVC thrombosis.  Transitioned from heparin to warfarin 12/29, No bridge since patient has recurrent hemorrhagic pericardial effusion.     INR subtherapeutic at 1.1 s/p D#2 warfarin start  Goal of Therapy:  INR 2-2.4 per MD request Monitor platelets by anticoagulation protocol: Yes   Plan:   Warfarin 7.5 mg PO x 1 today Daily INR, s/s bleeding  Bertis Ruddy, PharmD Clinical Pharmacist Please check AMION for all [place] numbers 10/11/2019 8:37 AM

## 2019-10-11 NOTE — Progress Notes (Signed)
Veronica [name]   DOB:December 21, 1970   YW#:[id number]   SWN#:[id number]  Subjective: Braeden looks and feels better.  Her facial swelling is greatly decreased.  As she tells me her legs are still a little bit "full" but she keeps them elevated as much as she can.  She ambulates to the bathroom without difficulty.  She denies pain, fever, rash, unusual headaches, visual changes, nausea, vomiting, cough, phlegm production, or change in bowel or bladder habits.  No family in room.  Objective: young white woman examined sitting at bedside Vitals:   10/11/19 0530 10/11/19 0847  BP: 109/77   Pulse: 99 (!) 104  Resp:    Temp: 98.4 F (36.9 C)   SpO2: 100%     Body mass index is 39.87 kg/m.  Intake/Output Summary (Last 24 hours) at 10/11/2019 1127 Last data filed at 10/10/2019 2212 Gross per 24 hour  Intake 250 ml  Output 0 ml  Net 250 ml   Sclerae unicteric, EOMs intact Facial plethora decreased No cervical or supraclavicular adenopathy Lungs no rales or rhonchi, dullness left base Heart regular rate and rhythm Abd soft, nontender, positive bowel sounds Neuro: nonfocal, well oriented, appropriate affect Breasts: Deferred   CBG (last 3)  No results for input(s): GLUCAP in the last 72 hours.   Labs:  Lab Results  Component Value Date   WBC 11.9 (H) 10/11/2019   HGB 9.9 (L) 10/11/2019   HCT 30.3 (L) 10/11/2019   MCV 90.7 10/11/2019   PLT 300 10/11/2019   NEUTROABS 10.2 (H) 10/11/2019    '@LASTCHEMISTRY' @  Urine Studies No results for input(s): UHGB, CRYS in the last 72 hours.  Invalid input(s): UACOL, UAPR, USPG, UPH, UTP, UGL, UKET, UBIL, UNIT, UROB, [place], UEPI, UWBC, [place], [place], [place], [place], [place]  Basic Metabolic Panel: Recent Labs  Lab 10/05/19 0230 10/08/19 0924 10/09/19 1443 10/10/19 0311 10/11/19 0500  NA 134* 135 135 137 137  K 3.8 3.6 4.0 4.1 4.0  CL 103 102 101 102 103  CO2 21* 21* 23 20* 23  GLUCOSE 191* 182* 198* 167* 167*  BUN 9 <5* '9 8 9  ' CREATININE 0.77 0.74  0.81 0.72 0.85  CALCIUM 8.9 8.4* 9.2 9.2 9.2  MG 1.9  --  1.9 2.0 2.0  PHOS  --   --  5.0* 5.5* 5.4*   GFR Estimated Creatinine Clearance: 87.5 mL/min (by C-G formula based on SCr of 0.85 mg/dL). Liver Function Tests: Recent Labs  Lab 10/05/19 0230 10/09/19 1443 10/10/19 0311 10/11/19 0500  AST 14* 52* 32 27  ALT 27 56* 50* 46*  ALKPHOS 73 140* 139* 133*  BILITOT 1.4* 1.0 0.8 1.0  PROT 5.0* 5.9* 5.5* 5.7*  ALBUMIN 2.8* 2.8* 2.7* 2.7*   No results for input(s): LIPASE, AMYLASE in the last 168 hours. No results for input(s): AMMONIA in the last 168 hours. Coagulation profile Recent Labs  Lab 10/06/19 1117 10/09/19 1443 10/10/19 0311 10/11/19 0707  INR 1.1 1.0 1.1 1.1    CBC: Recent Labs  Lab 10/05/19 0230 10/06/19 1115 10/08/19 0924 10/09/19 1443 10/10/19 0311 10/11/19 0500  WBC 4.6 7.4 13.1* 15.7* 11.4* 11.9*  NEUTROABS 3.2 5.8  --  13.0* 7.7 10.2*  HGB 11.4* 10.9* 10.3* 10.7* 10.2* 9.9*  HCT 33.6* 32.9* 31.8* 32.9* 30.7* 30.3*  MCV 89.4 89.6 91.6 91.4 89.8 90.7  PLT 47* 89* PLATELET CLUMPS NOTED ON SMEAR, UNABLE TO ESTIMATE 277 251 300   Cardiac Enzymes: No results for input(s): CKTOTAL, CKMB, CKMBINDEX, TROPONINI in  the last 168 hours. BNP: Invalid input(s): POCBNP CBG: No results for input(s): GLUCAP in the last 168 hours. D-Dimer Recent Labs    10/09/19 1443 10/11/19 0707  DDIMER 4.51* 7.67*   Hgb A1c No results for input(s): HGBA1C in the last 72 hours. Lipid Profile No results for input(s): CHOL, HDL, LDLCALC, TRIG, CHOLHDL, LDLDIRECT in the last 72 hours. Thyroid function studies No results for input(s): TSH, T4TOTAL, T3FREE, THYROIDAB in the last 72 hours.  Invalid input(s): FREET3 Anemia work up Recent Labs    10/09/19 1443 10/11/19 0500  FERRITIN 1,470* 1,015*   Microbiology Recent Results (from the past 240 hour(s))  MRSA PCR Screening     Status: None   Collection Time: 10/02/19  8:57 PM   Specimen: Nasal Mucosa;  Nasopharyngeal  Result Value Ref Range Status   MRSA by PCR NEGATIVE NEGATIVE Final    Comment:        The GeneXpert MRSA Assay (FDA approved for NASAL specimens only), is one component of a comprehensive MRSA colonization surveillance program. It is not intended to diagnose MRSA infection nor to guide or monitor treatment for MRSA infections. Performed at [facility], [place] [address], [place], [place] [address]       Studies:  No results found.  Assessment: 48 y.o. Heather Roberts, [place] woman admitted with extensive SVC-associated clot/ PEs, complicated by clinical SVC syndrome, in the setting of early-stage breast cancer treatment as follows  (0) status post right breast upper outer quadrant biopsy 05/01/2019 for a clinical T1c N1, stage IIA invasive ductal carcinoma, grade 3, estrogen and progesterone receptor positive, HER-2 nonamplified, with an MIB-1-1 of 20%.             (a) mass in the axillary tail was a positive lymph node   (1) MammaPrint obtained from the original biopsy shows a high risk luminal subtype B tumor   (2) genetics testing 05/09/2019 through the Common Hereditary Gene Panel offered by [facility] found no deleterious mutations in APC, ATM, AXIN2, BARD1, BMPR1A, BRCA1, BRCA2, BRIP1, CDH1, CDK4, CDKN2A (p14ARF), CDKN2A (p16INK4a), CHEK2, CTNNA1, DICER1, EPCAM (Deletion/duplication testing only), GREM1 (promoter region deletion/duplication testing only), KIT, MEN1, MLH1, MSH2, MSH3, MSH6, MUTYH, NBN, NF1, NHTL1, PALB2, PDGFRA, PMS2, POLD1, POLE, PTEN, RAD50, RAD51C, RAD51D, RNF43, SDHB, SDHC, SDHD, SMAD4, SMARCA4. STK11, TP53, TSC1, TSC2, and VHL.  The following genes were evaluated for sequence changes only: SDHA and HOXB13 c.251G>A variant only.              (a) A variant of uncertain significance (VUS) was detected in one of her MSH6 genes (c.831A>C).   (3) status post right lumpectomy and sentinel lymph node sampling 06/12/2019 for a pT2 pN1, stage IIA invasive  ductal carcinoma, grade 2, with positive margins             (a) a total of 4 sentinel lymph nodes removed, one positive (with ECE), ine itc             (b) margin clearance 04/19/2019 successful medial margin close but negative for DCIS   (4) adjuvant chemotherapy consisting of doxorubicin and cyclophosphamide in dose dense fashion x4 started 07/10/2019, completed 09/24/2019, to be followed by weekly paclitaxel x12             (a) echo 06/26/2019 shows an EF of 60-65%             (b echo on 09/19/2019 shows an EF of 60-65%   (5) adjuvant radiation to follow   (  6) antiestrogens to start at the completion of local treatment  (7) CT angio 09/28/2019 shows bilateral pulmonary emboli and a filling defect in the SVC associated with port  (a) thrombectomy 10/02/2019 [facility] complicated by large hemopericardium and tamponade requiring subxiphoid pericardial window    Plan:  Adrena is about 2-1/2 weeks out from her most recent chemotherapy dose.  Normally we would have started her paclitaxel weekly this week, but certainly delaying it another week or even to will not compromise results.  She has had a complex hospital course but appears to be making good progress and I expect she will be discharged home before I return to the clinic 10/14/2018  Tentatively I am scheduling her to see me late next week.  At that time we will review her overall situation and likely initiate weekly paclitaxel on October 22, 2018  I will be unavailable until 10/14/2018.  Please consult my partners if needed before that date.  Chauncey Cruel, MD 10/11/2019  11:27 AM Medical Oncology and Hematology [facility] [address] [place], [place] [address] Tel. [phone]    Fax. [phone]

## 2019-10-11 NOTE — Progress Notes (Signed)
Physical Therapy Treatment Patient Details Name: Veronica [name] MRN: [id number] DOB: 11/05/1970 Today's Date: 10/11/2019    History of Present Illness Pt adm with facial and UE swelling. 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 Pt currently being treated for metastatic breast CA. Pt found to be covid +. Pt underwent percutaneous SVC thrombectomy on 12/22 and developed Large hemopericardium with tamponade and underwent emergent Subxyphoid pericardial window for drainage of hemopericardium. PMH - metastatic breast CA with lumpectomy 04/2019, currently undergoing chemo with radiation planned, htn, low back pain    PT Comments    Pt doing well. Amb independently without assistive device. Encouraged to continue to mobilize. No further PT needed.    Follow Up Recommendations  No PT follow up     Equipment Recommendations  None recommended by PT    Recommendations for Other Services       Precautions / Restrictions Precautions Precautions: None    Mobility  Bed Mobility Overal bed mobility: Independent                Transfers Overall transfer level: Modified independent Equipment used: None                Ambulation/Gait Ambulation/Gait assistance: Modified independent (Device/Increase time) Gait Distance (Feet): 300 Feet Assistive device: None Gait Pattern/deviations: Step-through pattern;Decreased stride length Gait velocity: decr Gait velocity interpretation: 1.31 - 2.62 ft/sec, indicative of limited community ambulator General Gait Details: Steady gait without assist   Stairs             Wheelchair Mobility    Modified Rankin (Stroke Patients Only)       Balance Overall balance assessment: Mild deficits observed, not formally tested                                          Cognition Arousal/Alertness: Awake/alert Behavior During Therapy: WFL for tasks assessed/performed Overall Cognitive Status: Within  Functional Limits for tasks assessed                                        Exercises      General Comments        Pertinent Vitals/Pain Pain Assessment: No/denies pain    Home Living                      Prior Function            PT Goals (current goals can now be found in the care plan section) Acute Rehab PT Goals Patient Stated Goal: return home Progress towards PT goals: Goals met/education completed, patient discharged from PT    Frequency           PT Plan Current plan remains appropriate    Co-evaluation              AM-PAC PT "6 Clicks" Mobility   Outcome Measure  Help needed turning from your back to your side while in a flat bed without using bedrails?: None Help needed moving from lying on your back to sitting on the side of a flat bed without using bedrails?: None Help needed moving to and from a bed to a chair (including a wheelchair)?: None Help needed standing up from a chair using your arms (e.g.,  wheelchair or bedside chair)?: None Help needed to walk in hospital room?: None Help needed climbing 3-5 steps with a railing? : None 6 Click Score: 24    End of Session   Activity Tolerance: Patient tolerated treatment well Patient left: with call bell/phone within reach;in bed   PT Visit Diagnosis: Other abnormalities of gait and mobility (R26.89)     Time: 1449-1500 PT Time Calculation (min) (ACUTE ONLY): 11 min  Charges:  $Gait Training: 8-22 mins                     [place] Pager [phone] Office [place] 10/11/2019, 4:59 PM

## 2019-10-12 LAB — D-DIMER, QUANTITATIVE: D-Dimer, Quant: 7.13 ug/mL-FEU — ABNORMAL HIGH (ref 0.00–0.50)

## 2019-10-12 LAB — COMPREHENSIVE METABOLIC PANEL
ALT: 39 U/L (ref 0–44)
AST: 26 U/L (ref 15–41)
Albumin: 2.8 g/dL — ABNORMAL LOW (ref 3.5–5.0)
Alkaline Phosphatase: 116 U/L (ref 38–126)
Anion gap: 12 (ref 5–15)
BUN: 6 mg/dL (ref 6–20)
CO2: 23 mmol/L (ref 22–32)
Calcium: 8.9 mg/dL (ref 8.9–10.3)
Chloride: 102 mmol/L (ref 98–111)
Creatinine, Ser: 0.54 mg/dL (ref 0.44–1.00)
GFR calc Af Amer: >60 mL/min (ref >60)
GFR calc non Af Amer: >60 mL/min (ref >60)
Glucose, Bld: 149 mg/dL — ABNORMAL HIGH (ref 70–99)
Potassium: 3.8 mmol/L (ref 3.5–5.1)
Sodium: 137 mmol/L (ref 135–145)
Total Bilirubin: 1.2 mg/dL (ref 0.3–1.2)
Total Protein: 5.3 g/dL — ABNORMAL LOW (ref 6.5–8.1)

## 2019-10-12 LAB — CBC WITH DIFFERENTIAL/PLATELET
Abs Immature Granulocytes: 2.29 10*3/uL — ABNORMAL HIGH (ref 0.00–0.07)
Basophils Absolute: 0.1 10*3/uL (ref 0.0–0.1)
Basophils Relative: 1 %
Eosinophils Absolute: 0 10*3/uL (ref 0.0–0.5)
Eosinophils Relative: 0 %
HCT: 30.5 % — ABNORMAL LOW (ref 36.0–46.0)
Hemoglobin: 9.9 g/dL — ABNORMAL LOW (ref 12.0–15.0)
Immature Granulocytes: 21 %
Lymphocytes Relative: 6 %
Lymphs Abs: 0.6 10*3/uL — ABNORMAL LOW (ref 0.7–4.0)
MCH: 29.7 pg (ref 26.0–34.0)
MCHC: 32.5 g/dL (ref 30.0–36.0)
MCV: 91.6 fL (ref 80.0–100.0)
Monocytes Absolute: 1 10*3/uL (ref 0.1–1.0)
Monocytes Relative: 9 %
Neutro Abs: 7.1 10*3/uL (ref 1.7–7.7)
Neutrophils Relative %: 63 %
Platelets: 283 10*3/uL (ref 150–400)
RBC: 3.33 MIL/uL — ABNORMAL LOW (ref 3.87–5.11)
RDW: 15.3 % (ref 11.5–15.5)
WBC: 11 10*3/uL — ABNORMAL HIGH (ref 4.0–10.5)
nRBC: 0.5 % — ABNORMAL HIGH (ref 0.0–0.2)

## 2019-10-12 LAB — PROTIME-INR
INR: 1.2 (ref 0.8–1.2)
Prothrombin Time: 14.9 seconds (ref 11.4–15.2)

## 2019-10-12 LAB — FERRITIN: Ferritin: 935 ng/mL — ABNORMAL HIGH (ref 11–307)

## 2019-10-12 LAB — MAGNESIUM: Magnesium: 1.9 mg/dL (ref 1.7–2.4)

## 2019-10-12 LAB — C-REACTIVE PROTEIN: CRP: 4 mg/dL — ABNORMAL HIGH (ref <1.0)

## 2019-10-12 LAB — PHOSPHORUS: Phosphorus: 4.7 mg/dL — ABNORMAL HIGH (ref 2.5–4.6)

## 2019-10-12 MED ORDER — WARFARIN SODIUM 5 MG PO TABS
5.0000 mg | ORAL_TABLET | Freq: Once | ORAL | Status: AC
  Administered 2019-10-12: 5 mg via ORAL
  Filled 2019-10-12: qty 1

## 2019-10-12 NOTE — Progress Notes (Signed)
PROGRESS NOTE    Veronica [name]  [id number] DOB: 03/10/1971 DOA: 09/28/2019 PCP: Blair Heys, PA-C    Brief Narrative:  49 yo female admitted with PE and SVC syndrome with hx of breast cancer.  Had thrombectomy on [id number] complicated by hemopericardium and tamponade requiring subxyphoid pericardial window.  Incidentally found to be positive for COVID 19   Assessment & Plan:   Principal Problem:   Multiple subsegmental pulmonary emboli without acute cor pulmonale (HCC) Active Problems:   Malignant neoplasm of upper-outer quadrant of right breast in female, estrogen receptor positive (HCC)   Thrombocytopenia (HCC)   Superior vena cava thrombosis (HCC)   SVCO (superior vena cava obstruction)   SVC syndrome   Sinus tachycardia   Essential hypertension   Gastroesophageal reflux disease  1 acute PE with acute SVC syndrome status post mechanical thrombectomy complicated by hemopericardium with tamponade status post xiphoid window./Right lower extremity DVT Pericardial drain removed by cardiothoracic surgery on 10/07/2019. Patient being followed by vascular and cardiothoracic surgery.  Repeat 2D echo with a recurrent small pericardial effusion with normal EF.  Patient was on heparin which has been discontinued by cardiothoracic surgery and patient started on Coumadin with goal INR 2-2.4.  INR currently at 1.2.  Continue Keflex for cellulitis at incision area.  Per cardiothoracic surgery repeat 2D echo in about 4 days to follow-up pericardial effusion.  Per cardiothoracic surgery.   2.  Sinus tachycardia/history of hypertension Blood pressure stable.  Patient with improvement with tachycardia. Continue Lopressor and uptitrate as needed for tachycardia.  Continue to hold Benicar. Follow.  3.  Atelectasis, bilateral pleural effusions/bilateral lower extremity edema/right lower extremity DVT Continue low-dose Lasix.  Incentive spirometry.  Patient with bilateral lower extremity edema however  denies any shortness of breath or chest pain.  Patient noted to have DVTs.  Patient with borderline blood pressure which has improved.  Patient with a urine output of 3.1 L over the past 24 hours.  Continue current dose of oral Lasix.  Follow.  4.  COVID-19 infection with initial concern for COVID-19 pneumonia Status post full course of remdesivir.  Patient improved clinically.  Patient with sats of 98% on room air.  CRP trending down.  Ferritin elevated.  Follow.   5.  Chemotherapy-induced pancytopenia/A ER positive breast cancer Pancytopenia improved.  Hemoglobin currently at 9.9, platelet count at 283.  Patient denies any overt bleeding.  Per oncology transfuse for hemoglobin < 8 and platelet count  < 20.  Oncology following and appreciate input and recommendations.  Outpatient follow-up with oncology.  6.  History of gastroesophageal reflux disease Continue Pepcid.     DVT prophylaxis: Coumadin Code Status: Full Family Communication: Updated patient.  No family at bedside. Disposition Plan: Home when clinically improved, stable, INR therapeutic and when cleared by cardiothoracic surgery.   Consultants:  Vascular, TCTS, Oncology  Procedures:  CTA chest 12/18 > bilateral lower lobe PE, SVC occlusion, probable RLL infarct, small b/l effusions. CT head 12/18 > no acute findings. Echo 12/20 > EF 60-65%, small pericardial effusion. Echo 12/22 (post thrombectomy) >significantly reduced clot burden in the SVC with improved flow on the venogram. Large expanding circumferential pericardial effusion with evidence of tamponade. Echo 12/22 (post pericardial window) > resolution of pericardial effusion without evidence of tamponade, LVEF > 65%, small clot burden in SVC. ETT 12/22 >12/23 L radial art line 12/22 > Right femoral CVL >> 12/25 2D echo 10/08/2019  Significant Hospital Events   12/18 admit. 12/22 to OR for  thrombectomy which was complicated bylarge hemopericardium with  tamponade requiring subxyphoid pericardial window. 12/23 extubated 12/25 add lopressor for tachycardia 12/27 pericardial drain removed.    Antimicrobials:  Remdesivir 12/21 >> 12/25 Keflex 10/07/2019>>> 10/14/2019  Micro Data:  SARS CoV2 12/18 > positive.   COVID-19 Labs  Recent Labs    10/11/19 0500 10/11/19 0707 10/12/19 0353  DDIMER  --  7.67* 7.13*  FERRITIN 1,015*  --  935*  CRP 5.4*  --  4.0*    Lab Results  Component Value Date   SARSCOV2NAA POSITIVE (A) 09/28/2019   SARSCOV2NAA NEGATIVE 08/28/2019   SARSCOV2NAA NOT DETECTED 07/13/2019   [place] NEGATIVE 06/08/2019    Subjective: Patient sitting up in chair.  Patient somewhat frustrated about still being in the hospital asking when she can go home.  Asking whether she can go home and come back for 2D echo that is needed in a few days.  Feels upper extremity swelling slowly improving.  Denies any bleeding.   Objective: Vitals:   10/11/19 2101 10/12/19 0000 10/12/19 0500 10/12/19 0507  BP: (!) 120/95   120/69  Pulse:    93  Resp:  12  (!) 21  Temp: 98.9 F (37.2 C) 98.5 F (36.9 C)  98.8 F (37.1 C)  TempSrc:  Oral  Oral  SpO2:  100%  92%  Weight:   104.6 kg   Height:        Intake/Output Summary (Last 24 hours) at 10/12/2019 1118 Last data filed at 10/12/2019 0600 Gross per 24 hour  Intake 1240 ml  Output 2500 ml  Net -1260 ml   Filed Weights   09/28/19 1659 10/12/19 0500  Weight: 95.7 kg 104.6 kg    Examination:  General exam: NAD. Respiratory system: CTA B.  No wheezes, no crackles, no rhonchi.  Normal respiratory effort.  Cardiovascular system: RRR no murmurs rubs or gallops.  2+ bilateral lower extremity edema.  Bilateral upper extremity swelling.  Subxiphoid area with bandage. Gastrointestinal system: Abdomen is soft, nontender, nondistended, positive bowel sounds.  No rebound.  No guarding.   Central nervous system: Alert and oriented. No focal neurological deficits. Extremities:  Symmetric 5 x 5 power. Skin: No rashes, lesions or ulcers Psychiatry: Judgement and insight appear normal. Mood & affect appropriate.     Data Reviewed: I have personally reviewed following labs and imaging studies  CBC: Recent Labs  Lab 10/06/19 1115 10/08/19 0924 10/09/19 1443 10/10/19 0311 10/11/19 0500 10/12/19 0353  WBC 7.4 13.1* 15.7* 11.4* 11.9* 11.0*  NEUTROABS 5.8  --  13.0* 7.7 10.2* 7.1  HGB 10.9* 10.3* 10.7* 10.2* 9.9* 9.9*  HCT 32.9* 31.8* 32.9* 30.7* 30.3* 30.5*  MCV 89.6 91.6 91.4 89.8 90.7 91.6  PLT 89* PLATELET CLUMPS NOTED ON SMEAR, UNABLE TO ESTIMATE 277 251 300 [id number]   Basic Metabolic Panel: Recent Labs  Lab 10/08/19 0924 10/09/19 1443 10/10/19 0311 10/11/19 0500 10/12/19 0353  NA 135 135 137 137 137  K 3.6 4.0 4.1 4.0 3.8  CL 102 101 102 103 102  CO2 21* 23 20* 23 23  GLUCOSE 182* 198* 167* 167* 149*  BUN <5* 9 8 9 6   CREATININE 0.74 0.81 0.72 0.85 0.54  CALCIUM 8.4* 9.2 9.2 9.2 8.9  MG  --  1.9 2.0 2.0 1.9  PHOS  --  5.0* 5.5* 5.4* 4.7*   GFR: Estimated Creatinine Clearance: 97.8 mL/min (by C-G formula based on SCr of 0.54 mg/dL). Liver Function Tests: Recent Labs  Lab 10/09/19 1443  10/10/19 0311 10/11/19 0500 10/12/19 0353  AST 52* 32 27 26  ALT 56* 50* 46* 39  ALKPHOS 140* 139* 133* 116  BILITOT 1.0 0.8 1.0 1.2  PROT 5.9* 5.5* 5.7* 5.3*  ALBUMIN 2.8* 2.7* 2.7* 2.8*   No results for input(s): LIPASE, AMYLASE in the last 168 hours. No results for input(s): AMMONIA in the last 168 hours. Coagulation Profile: Recent Labs  Lab 10/06/19 1117 10/09/19 1443 10/10/19 0311 10/11/19 0707 10/12/19 0353  INR 1.1 1.0 1.1 1.1 1.2   Cardiac Enzymes: No results for input(s): CKTOTAL, CKMB, CKMBINDEX, TROPONINI in the last 168 hours. BNP (last 3 results) No results for input(s): PROBNP in the last 8760 hours. HbA1C: No results for input(s): HGBA1C in the last 72 hours. CBG: No results for input(s): GLUCAP in the last 168 hours. Lipid  Profile: No results for input(s): CHOL, HDL, LDLCALC, TRIG, CHOLHDL, LDLDIRECT in the last 72 hours. Thyroid Function Tests: No results for input(s): TSH, T4TOTAL, FREET4, T3FREE, THYROIDAB in the last 72 hours. Anemia Panel: Recent Labs    10/11/19 0500 10/12/19 0353  FERRITIN 1,015* 935*   Sepsis Labs: No results for input(s): PROCALCITON, LATICACIDVEN in the last 168 hours.  Recent Results (from the past 240 hour(s))  MRSA PCR Screening     Status: None   Collection Time: 10/02/19  8:57 PM   Specimen: Nasal Mucosa; Nasopharyngeal  Result Value Ref Range Status   MRSA by PCR NEGATIVE NEGATIVE Final    Comment:        The GeneXpert MRSA Assay (FDA approved for NASAL specimens only), is one component of a comprehensive MRSA colonization surveillance program. It is not intended to diagnose MRSA infection nor to guide or monitor treatment for MRSA infections. Performed at [facility], [place] [address] [place], [place] [address]          Radiology Studies: No results found.      Scheduled Meds: . cephALEXin  500 mg Oral Q8H  . Chlorhexidine Gluconate Cloth  6 each Topical Daily  . famotidine  10 mg Oral BID  . feeding supplement (ENSURE ENLIVE)  237 mL Oral TID BM  . feeding supplement (PRO-STAT SUGAR FREE 64)  30 mL Oral BID  . furosemide  10 mg Oral Daily  . mouth rinse  15 mL Mouth Rinse BID  . metoprolol tartrate  25 mg Oral BID  . sodium chloride flush  10-40 mL Intracatheter Q12H  . Warfarin - Pharmacist Dosing Inpatient   Does not apply q1800   Continuous Infusions: . sodium chloride Stopped (10/05/19 0829)     LOS: 14 days    Time spent: 40 minutes    Irine Seal, MD [facility]  If 7PM-7AM, please contact night-coverage [email or web address] 10/12/2019, 11:18 AM

## 2019-10-12 NOTE — Progress Notes (Signed)
[place] for Warfarin Indication: pulmonary embolus   Patient Measurements: Height: 5\' 1"  (154.9 cm) Weight: 230 lb 9.6 oz (104.6 kg) IBW/kg (Calculated) : 47.8 Heparin Dosing Weight: 71 kg  Vital Signs: Temp: 98.8 F (37.1 C) (01/01 0507) Temp Source: Oral (01/01 0507) BP: 120/69 (01/01 0507) Pulse Rate: 93 (01/01 0507)  Labs: Recent Labs    10/10/19 0311 10/11/19 0500 10/11/19 0707 10/12/19 0353  HGB 10.2* 9.9*  --  9.9*  HCT 30.7* 30.3*  --  30.5*  PLT 251 300  --  283  LABPROT 14.1  --  14.3 14.9  INR 1.1  --  1.1 1.2  CREATININE 0.72 0.85  --  0.54      Assessment: Pharmacy consulted to dose/monitor heparin in this 49 year old female with PMH significant for breast cancer on chemotherapy with curative intent. CT angio on 12/18 revealed acute bilateral PE, acute occlusive SVC thrombosis.  Transitioned from heparin to warfarin on 12/29 with plan to not bridge since patient has recurrent hemorrhagic pericardial effusion. INR subtherapeutic at 1.2. CBC stable. No bleeding reported.    Goal of Therapy:  INR 2-2.4 per MD request Monitor platelets by anticoagulation protocol: Yes   Plan:   Warfarin 5mg  PO x 1 today Daily INR, s/s bleeding  Cristela Felt, PharmD PGY1 Pharmacy Resident [employer]: [phone]  10/12/2019 1:11 PM

## 2019-10-13 ENCOUNTER — Inpatient Hospital Stay (HOSPITAL_COMMUNITY): Payer: 59

## 2019-10-13 DIAGNOSIS — M7989 Other specified soft tissue disorders: Secondary | ICD-10-CM

## 2019-10-13 LAB — MAGNESIUM: Magnesium: 2 mg/dL (ref 1.7–2.4)

## 2019-10-13 LAB — CBC WITH DIFFERENTIAL/PLATELET
Abs Immature Granulocytes: 1.76 10*3/uL — ABNORMAL HIGH (ref 0.00–0.07)
Basophils Absolute: 0.1 10*3/uL (ref 0.0–0.1)
Basophils Relative: 1 %
Eosinophils Absolute: 0 10*3/uL (ref 0.0–0.5)
Eosinophils Relative: 0 %
HCT: 30.1 % — ABNORMAL LOW (ref 36.0–46.0)
Hemoglobin: 9.8 g/dL — ABNORMAL LOW (ref 12.0–15.0)
Immature Granulocytes: 16 %
Lymphocytes Relative: 5 %
Lymphs Abs: 0.6 10*3/uL — ABNORMAL LOW (ref 0.7–4.0)
MCH: 29.7 pg (ref 26.0–34.0)
MCHC: 32.6 g/dL (ref 30.0–36.0)
MCV: 91.2 fL (ref 80.0–100.0)
Monocytes Absolute: 1.1 10*3/uL — ABNORMAL HIGH (ref 0.1–1.0)
Monocytes Relative: 10 %
Neutro Abs: 7.4 10*3/uL (ref 1.7–7.7)
Neutrophils Relative %: 68 %
Platelets: 296 10*3/uL (ref 150–400)
RBC: 3.3 MIL/uL — ABNORMAL LOW (ref 3.87–5.11)
RDW: 15.6 % — ABNORMAL HIGH (ref 11.5–15.5)
WBC: 10.9 10*3/uL — ABNORMAL HIGH (ref 4.0–10.5)
nRBC: 0.5 % — ABNORMAL HIGH (ref 0.0–0.2)

## 2019-10-13 LAB — COMPREHENSIVE METABOLIC PANEL
ALT: 34 U/L (ref 0–44)
AST: 23 U/L (ref 15–41)
Albumin: 2.6 g/dL — ABNORMAL LOW (ref 3.5–5.0)
Alkaline Phosphatase: 103 U/L (ref 38–126)
Anion gap: 14 (ref 5–15)
BUN: 7 mg/dL (ref 6–20)
CO2: 22 mmol/L (ref 22–32)
Calcium: 8.9 mg/dL (ref 8.9–10.3)
Chloride: 101 mmol/L (ref 98–111)
Creatinine, Ser: 0.62 mg/dL (ref 0.44–1.00)
GFR calc Af Amer: >60 mL/min (ref >60)
GFR calc non Af Amer: >60 mL/min (ref >60)
Glucose, Bld: 150 mg/dL — ABNORMAL HIGH (ref 70–99)
Potassium: 3.7 mmol/L (ref 3.5–5.1)
Sodium: 137 mmol/L (ref 135–145)
Total Bilirubin: 1.1 mg/dL (ref 0.3–1.2)
Total Protein: 5.1 g/dL — ABNORMAL LOW (ref 6.5–8.1)

## 2019-10-13 LAB — PHOSPHORUS: Phosphorus: 4.9 mg/dL — ABNORMAL HIGH (ref 2.5–4.6)

## 2019-10-13 LAB — D-DIMER, QUANTITATIVE: D-Dimer, Quant: 7.46 ug/mL-FEU — ABNORMAL HIGH (ref 0.00–0.50)

## 2019-10-13 LAB — PROTIME-INR
INR: 1.3 — ABNORMAL HIGH (ref 0.8–1.2)
Prothrombin Time: 15.8 seconds — ABNORMAL HIGH (ref 11.4–15.2)

## 2019-10-13 LAB — FERRITIN: Ferritin: 711 ng/mL — ABNORMAL HIGH (ref 11–307)

## 2019-10-13 LAB — C-REACTIVE PROTEIN: CRP: 4.2 mg/dL — ABNORMAL HIGH (ref <1.0)

## 2019-10-13 IMAGING — CT CT ANGIO CHEST
2 of 7 series · 15 of 46 positions shown · IV contrast (omnipaque)
Comparison: [DATE]
COMPARISON: [DATE]

Addendum:
CLINICAL DATA: Concern for PE. Concern SVC syndrome with history of
breast cancer in known DVT.

EXAM:
CT ANGIOGRAPHY CHEST WITH CONTRAST
TECHNIQUE: Multidetector CT imaging of the chest was performed using the
standard protocol during bolus administration of intravenous
contrast. Multiplanar CT image reconstructions and MIPs were
obtained to evaluate the vascular anatomy.
CONTRAST:  75mL OMNIPAQUE IOHEXOL 350 MG/ML SOLN

[Series 6: thins · axial · 0.92mm/px · z∈[+809,+1038]mm · 12 of 259 slices shown]
[im 15/259  lung]
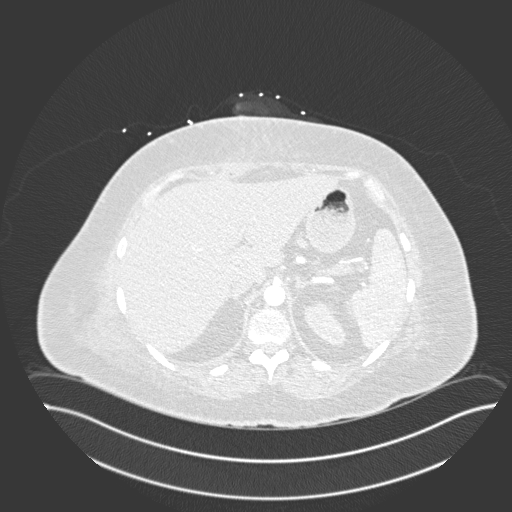
[im 44/259  soft-tissue]
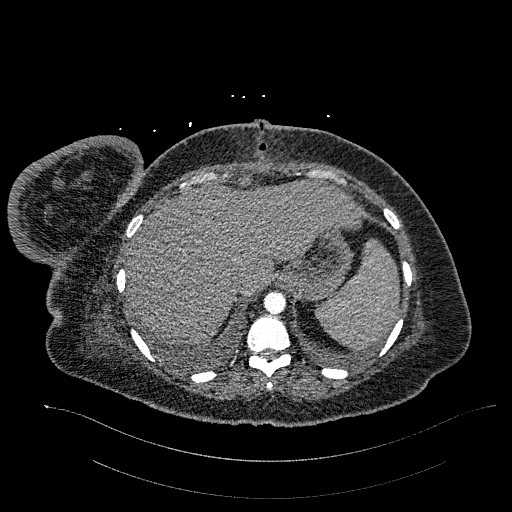
[im 58/259  lung]
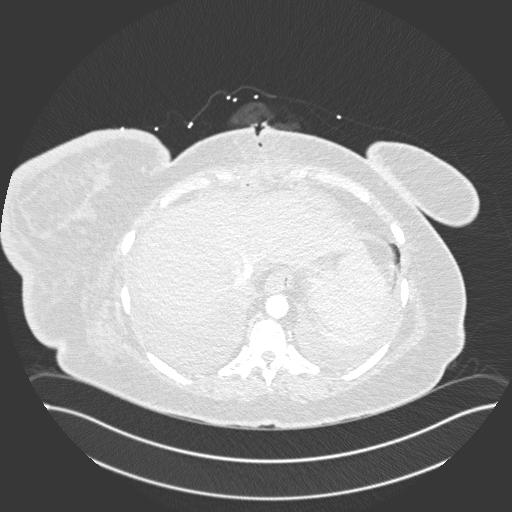
[im 72/259  soft-tissue]
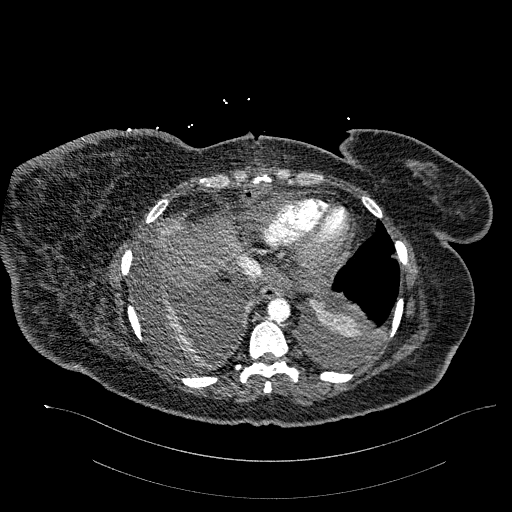
[im 101/259  lung]
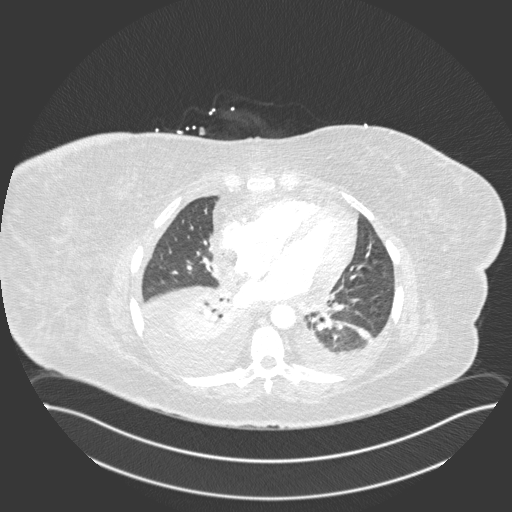
[im 115/259  soft-tissue]
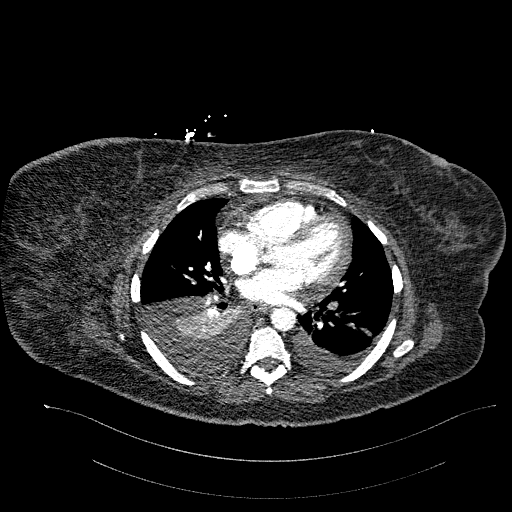
[im 144/259  lung]
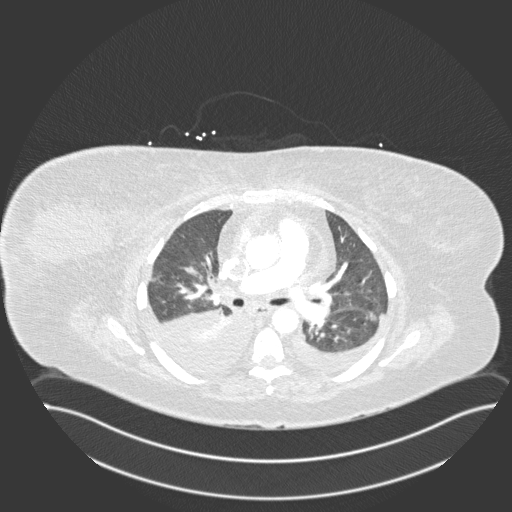
[im 158/259  soft-tissue]
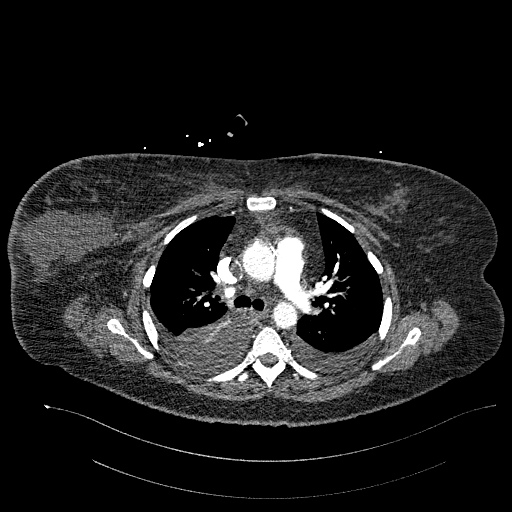
[im 187/259  lung]
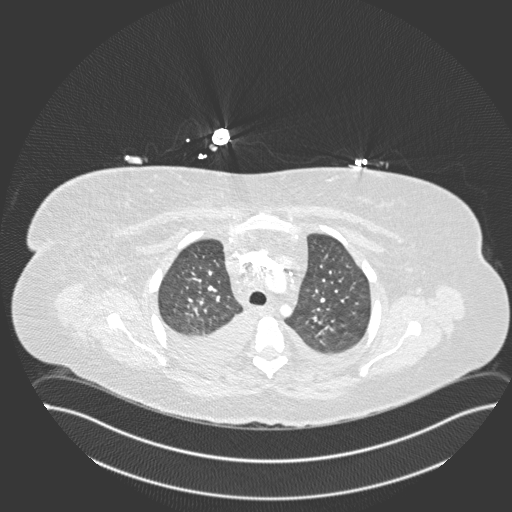
[im 201/259  soft-tissue]
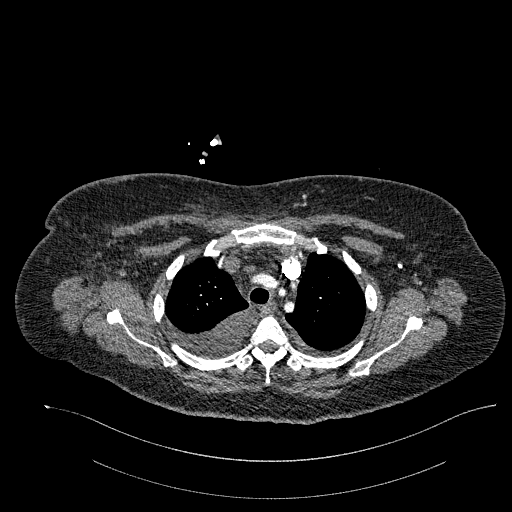
[im 216/259  lung]
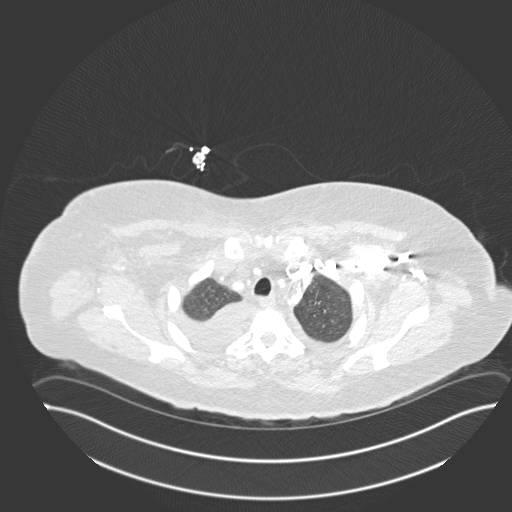
[im 244/259  soft-tissue]
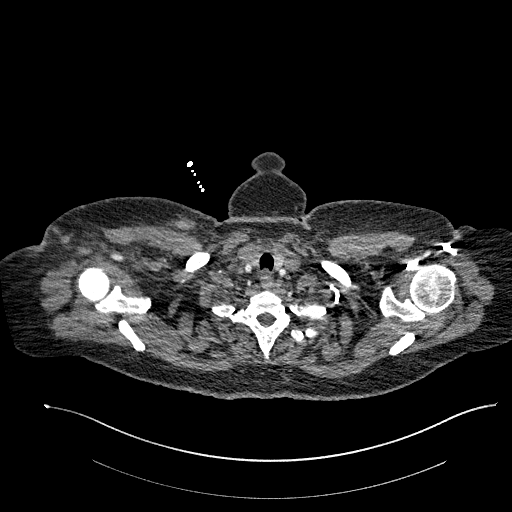

[Series 8: coronal mpr · coronal · 0.51mm/px · 3 of 151 slices shown]
[im 38/151  soft-tissue]
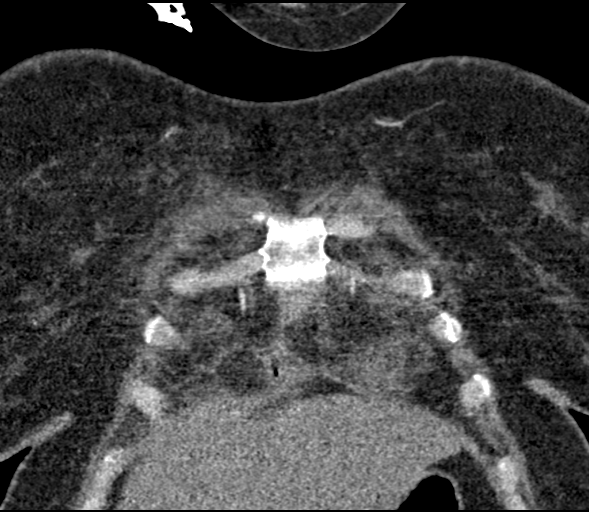
[im 76/151  soft-tissue]
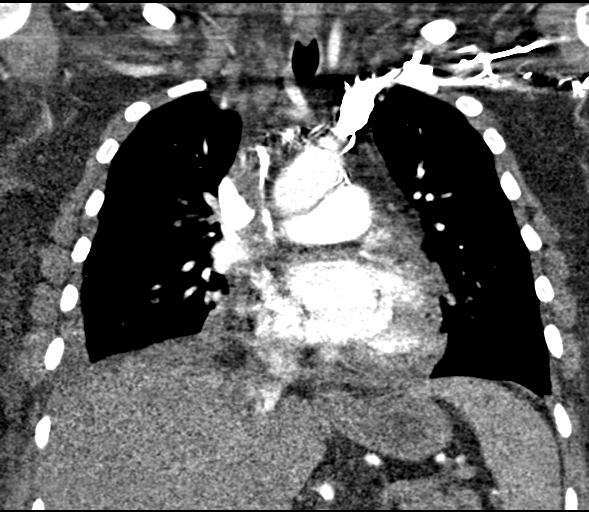
[im 113/151  soft-tissue]
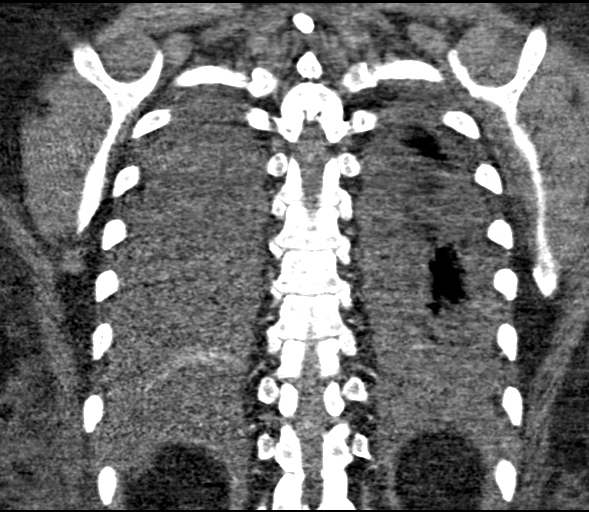

[15 of 46 positions shown; findings below may reference images not displayed]

FINDINGS: Cardiovascular: Contrast injection is sufficient to demonstrate
satisfactory opacification of the pulmonary arteries to the
segmental level.There are persistent acute segmental pulmonary
emboli involving the left lower lobe. There are scattered bilateral
subsegmental and segmental pulmonary emboli involving the bilateral
upper lobes. There appears to be lobar thrombus within the right
upper lobe pulmonary artery. There is evidence for right-sided heart
strain with an RV LV ratio measuring approximately 1.1. The heart
size is normal. Again noted is thrombus within the SVC, improved
from prior study. The right brachiocephalic vein is not well
evaluated secondary to contrast timing. There may be superior
extension of thrombus into this vein. Aortic calcifications are
noted. There are postsurgical changes of the mediastinum likely
related to open thrombectomy.

Mediastinum/Nodes:

--there are few mildly enlarged mediastinal lymph nodes.

--No axillary lymphadenopathy.

--No supraclavicular lymphadenopathy.

--Normal thyroid gland.

--The esophagus is unremarkable

Lungs/Pleura: There is a moderate to large right-sided pleural
effusion, increased from prior study. There is a small to
moderate-sized left-sided pleural effusion, increased from prior
study. There is complete collapse of the right lower lobe. There is
atelectasis at the left lung base. There is no pneumothorax.

Upper Abdomen: No acute abnormality.

Musculoskeletal: There is extensive overlying skin thickening
involving the right breast. There is a right breast mass measuring
approximately 8.1 by 4.8 cm and approximately 0 Hounsfield units.
There is no acute displaced fracture.

Review of the MIP images confirms the above findings.
IMPRESSION: 1. Acute bilateral pulmonary emboli are again noted as detailed
above. There is CT evidence for right-sided heart strain with an
RV/LV ratio measuring approximately 1.1.
2. Persistent but improved thrombus within the SVC.
3. Growing bilateral pleural effusions now moderate to large on the
right and small to moderate-sized on the left.
4. Complete collapse of the right lower lobe.
5. Low-attenuation right-sided breast mass as detailed above. This
could represent a postoperative seroma or hematoma in the
appropriate clinical setting. An abscess is not entirely excluded.
There is significant asymmetric skin thickening overlying the right
breast likely related to the patient's reported history of breast
cancer.

ADDENDUM:
These results were called by telephone at the time of interpretation
on [DATE] at [DATE] to provider NP BENJUMEA, who verbally
acknowledged these results.

*** End of Addendum ***
FINDINGS: Cardiovascular: Contrast injection is sufficient to demonstrate
satisfactory opacification of the pulmonary arteries to the
segmental level.There are persistent acute segmental pulmonary
emboli involving the left lower lobe. There are scattered bilateral
subsegmental and segmental pulmonary emboli involving the bilateral
upper lobes. There appears to be lobar thrombus within the right
upper lobe pulmonary artery. There is evidence for right-sided heart
strain with an RV LV ratio measuring approximately 1.1. The heart
size is normal. Again noted is thrombus within the SVC, improved
from prior study. The right brachiocephalic vein is not well
evaluated secondary to contrast timing. There may be superior
extension of thrombus into this vein. Aortic calcifications are
noted. There are postsurgical changes of the mediastinum likely
related to open thrombectomy.

Mediastinum/Nodes:

--there are few mildly enlarged mediastinal lymph nodes.

--No axillary lymphadenopathy.

--No supraclavicular lymphadenopathy.

--Normal thyroid gland.

--The esophagus is unremarkable

Lungs/Pleura: There is a moderate to large right-sided pleural
effusion, increased from prior study. There is a small to
moderate-sized left-sided pleural effusion, increased from prior
study. There is complete collapse of the right lower lobe. There is
atelectasis at the left lung base. There is no pneumothorax.

Upper Abdomen: No acute abnormality.

Musculoskeletal: There is extensive overlying skin thickening
involving the right breast. There is a right breast mass measuring
approximately 8.1 by 4.8 cm and approximately 0 Hounsfield units.
There is no acute displaced fracture.

Review of the MIP images confirms the above findings.
IMPRESSION: 1. Acute bilateral pulmonary emboli are again noted as detailed
above. There is CT evidence for right-sided heart strain with an
RV/LV ratio measuring approximately 1.1.
2. Persistent but improved thrombus within the SVC.
3. Growing bilateral pleural effusions now moderate to large on the
right and small to moderate-sized on the left.
4. Complete collapse of the right lower lobe.
5. Low-attenuation right-sided breast mass as detailed above. This
could represent a postoperative seroma or hematoma in the
appropriate clinical setting. An abscess is not entirely excluded.
There is significant asymmetric skin thickening overlying the right
breast likely related to the patient's reported history of breast
cancer.

## 2019-10-13 MED ORDER — IOHEXOL 350 MG/ML SOLN
75.0000 mL | Freq: Once | INTRAVENOUS | Status: AC | PRN
  Administered 2019-10-13: 75 mL via INTRAVENOUS

## 2019-10-13 MED ORDER — WARFARIN SODIUM 7.5 MG PO TABS
7.5000 mg | ORAL_TABLET | Freq: Once | ORAL | Status: AC
  Administered 2019-10-13: 7.5 mg via ORAL
  Filled 2019-10-13: qty 1

## 2019-10-13 MED ORDER — HEPARIN (PORCINE) 25000 UT/250ML-% IV SOLN
1250.0000 [IU]/h | INTRAVENOUS | Status: DC
  Administered 2019-10-13: 1250 [IU]/h via INTRAVENOUS
  Filled 2019-10-13: qty 250

## 2019-10-13 MED ORDER — ENOXAPARIN SODIUM 30 MG/0.3ML ~~LOC~~ SOLN
30.0000 mg | Freq: Every day | SUBCUTANEOUS | Status: DC
  Administered 2019-10-13: 30 mg via SUBCUTANEOUS
  Filled 2019-10-13: qty 0.3

## 2019-10-13 NOTE — Progress Notes (Signed)
No acute events overnight. Remains on room air. VSS. Ambulates in room unassisted. Declined bath overnight. No c/o pain. Chest dressing changed. I/O recorded.

## 2019-10-13 NOTE — Progress Notes (Addendum)
PROGRESS NOTE    Veronica [name]  [id number] DOB: Apr 18, 1971 DOA: 09/28/2019 PCP: Blair Heys, PA-C    Brief Narrative:  49 yo female admitted with PE and SVC syndrome with hx of breast cancer.  Had thrombectomy on [id number] complicated by hemopericardium and tamponade requiring subxyphoid pericardial window.  Incidentally found to be positive for COVID 19   Assessment & Plan:   Principal Problem:   Multiple subsegmental pulmonary emboli without acute cor pulmonale (HCC) Active Problems:   Malignant neoplasm of upper-outer quadrant of right breast in female, estrogen receptor positive (HCC)   Thrombocytopenia (HCC)   Superior vena cava thrombosis (HCC)   SVCO (superior vena cava obstruction)   SVC syndrome   Sinus tachycardia   Essential hypertension   Gastroesophageal reflux disease  1 acute PE with acute SVC syndrome status post mechanical thrombectomy complicated by hemopericardium with tamponade status post xiphoid window./Right lower extremity DVT Pericardial drain removed by cardiothoracic surgery on 10/07/2019. Patient being followed by vascular and cardiothoracic surgery.  Repeat 2D echo with a recurrent small pericardial effusion with normal EF.  Patient was on heparin which has been discontinued by cardiothoracic surgery and patient started on Coumadin with goal INR 2-2.4 due to concern for recurrent pericardial effusion.  INR currently at 1.3.  Continue Keflex for cellulitis at incision area.  Per cardiothoracic surgery repeat 2D echo on October 15, 2019 to follow-up on recurrent pericardial effusion/hemopericardium.  Patient with complaints of increasing swelling in the right upper extremity with tightening in the right forearm.  Will get a Doppler ultrasound.  Patient on Coumadin with a subtherapeutic INR.  Heparin was discontinued due to concern for recurrent hemopericardium.  Difficult situation.  Discussed with vascular who will follow up on Doppler ultrasounds of upper  extremities.  Spoke with Dr. Roxy Manns, cardiothoracic surgery in terms of worsening upper extremity swelling who had recommended that if upper extremity Dopplers are positive for a new clot then patient will need to be placed on IV heparin without a bolus.  Also recommended CT angio chest for further evaluation.  He also recommended patient be started on prophylactic dose Lovenox 30 mg subcu daily for now and keep right upper extremity elevated above the heart.  Mission sling has been ordered to keep right upper extremity elevated above the heart.  Per CT and vascular surgery.  2.  Sinus tachycardia/history of hypertension Blood pressure stable.  Tachycardia improving.  Continue Lopressor and uptitrate as needed for rate control.  Continue to hold Benicar.  Follow.   3.  Atelectasis, bilateral pleural effusions/bilateral lower extremity edema/right lower extremity DVT Continue low-dose Lasix.  Incentive spirometry.  Patient with bilateral lower extremity edema however denies any shortness of breath or chest pain.  Patient noted to have DVTs.  Patient with borderline blood pressure which has improved.  Patient with a urine output of 3.2 L over the past 24 hours.  Continue current dose of oral Lasix.  Follow.  4.  COVID-19 infection with initial concern for COVID-19 pneumonia Status post full course of remdesivir.  Patient improved clinically.  Patient with sats of 97% on room air.  CRP and ferritin trending down.  Ferritin elevated.  Follow.   5.  Chemotherapy-induced pancytopenia/A ER positive breast cancer Pancytopenia improved.  Hemoglobin currently at 9.8, platelet count at 296.  Patient denies any overt bleeding.  Per oncology transfuse for hemoglobin < 8 and platelet count  < 20.  Oncology following and appreciate input and recommendations.  Outpatient follow-up with  oncology.  6.  History of gastroesophageal reflux disease Pepcid.     DVT prophylaxis: Coumadin/Lovenox Code Status: Full Family  Communication: Updated patient.  No family at bedside. Disposition Plan: Home when clinically improved, stable, INR therapeutic and when cleared by cardiothoracic surgery.   Consultants:  Vascular, TCTS, Oncology  Procedures:  CTA chest 12/18 > bilateral lower lobe PE, SVC occlusion, probable RLL infarct, small b/l effusions. CT head 12/18 > no acute findings. Echo 12/20 > EF 60-65%, small pericardial effusion. Echo 12/22 (post thrombectomy) >significantly reduced clot burden in the SVC with improved flow on the venogram. Large expanding circumferential pericardial effusion with evidence of tamponade. Echo 12/22 (post pericardial window) > resolution of pericardial effusion without evidence of tamponade, LVEF > 65%, small clot burden in SVC. ETT 12/22 >12/23 L radial art line 12/22 > Right femoral CVL >> 12/25 2D echo 10/08/2019 Bilateral upper extremity Dopplers pending.  Significant Hospital Events   12/18 admit. 12/22 to OR for thrombectomy which was complicated bylarge hemopericardium with tamponade requiring subxyphoid pericardial window. 12/23 extubated 12/25 add lopressor for tachycardia 12/27 pericardial drain removed.    Antimicrobials:  Remdesivir 12/21 >> 12/25 Keflex 10/07/2019>>> 10/14/2019  Micro Data:  SARS CoV2 12/18 > positive.   COVID-19 Labs  Recent Labs    10/11/19 0500 10/11/19 0707 10/12/19 0353 10/13/19 0500  DDIMER  --  7.67* 7.13* 7.46*  FERRITIN 1,015*  --  935* 711*  CRP 5.4*  --  4.0* 4.2*    Lab Results  Component Value Date   SARSCOV2NAA POSITIVE (A) 09/28/2019   [place] NEGATIVE 08/28/2019   SARSCOV2NAA NOT DETECTED 07/13/2019   [place] NEGATIVE 06/08/2019    Subjective: Patient feels worsening with swelling in the right upper extremity.  Denies any chest pain.  No shortness of breath.  Denies any bleeding.    Objective: Vitals:   10/12/19 2119 10/12/19 2126 10/13/19 0500 10/13/19 0544  BP:  121/77  119/65    Pulse:  (!) 102    Resp:      Temp: 97.8 F (36.6 C)   98.4 F (36.9 C)  TempSrc: Oral   Oral  SpO2:  97%  97%  Weight:   102.2 kg   Height:        Intake/Output Summary (Last 24 hours) at 10/13/2019 1140 Last data filed at 10/13/2019 1057 Gross per 24 hour  Intake 2500 ml  Output 3400 ml  Net -900 ml   Filed Weights   09/28/19 1659 10/12/19 0500 10/13/19 0500  Weight: 95.7 kg 104.6 kg 102.2 kg    Examination:  General exam: NAD. Respiratory system: Lungs clear to auscultation bilaterally.  No wheezes, no crackles, no rhonchi.  Normal respiratory effort. Cardiovascular system: Regular rate and rhythm no murmurs rubs or gallops.  2+ bilateral lower extremity edema.  Bilateral upper extremity edema right greater than left with some tightening in right forearm.  Subxiphoid area with bandage. Gastrointestinal system: Abdomen is nontender, nondistended, soft, positive bowel sounds.  No rebound.  No guarding.   Central nervous system: Alert and oriented. No focal neurological deficits. Extremities: Symmetric 5 x 5 power. Skin: No rashes, lesions or ulcers Psychiatry: Judgement and insight appear normal. Mood & affect appropriate.     Data Reviewed: I have personally reviewed following labs and imaging studies  CBC: Recent Labs  Lab 10/09/19 1443 10/10/19 0311 10/11/19 0500 10/12/19 0353 10/13/19 0500  WBC 15.7* 11.4* 11.9* 11.0* 10.9*  NEUTROABS 13.0* 7.7 10.2* 7.1 7.4  HGB 10.7*  10.2* 9.9* 9.9* 9.8*  HCT 32.9* 30.7* 30.3* 30.5* 30.1*  MCV 91.4 89.8 90.7 91.6 91.2  PLT 277 251 300 283 0000000   Basic Metabolic Panel: Recent Labs  Lab 10/09/19 1443 10/10/19 0311 10/11/19 0500 10/12/19 0353 10/13/19 0500  NA 135 137 137 137 137  K 4.0 4.1 4.0 3.8 3.7  CL 101 102 103 102 101  CO2 23 20* 23 23 22   GLUCOSE 198* 167* 167* 149* 150*  BUN 9 8 9 6 7   CREATININE 0.81 0.72 0.85 0.54 0.62  CALCIUM 9.2 9.2 9.2 8.9 8.9  MG 1.9 2.0 2.0 1.9 2.0  PHOS 5.0* 5.5* 5.4* 4.7* 4.9*    GFR: Estimated Creatinine Clearance: 96.5 mL/min (by C-G formula based on SCr of 0.62 mg/dL). Liver Function Tests: Recent Labs  Lab 10/09/19 1443 10/10/19 0311 10/11/19 0500 10/12/19 0353 10/13/19 0500  AST 52* 32 27 26 23   ALT 56* 50* 46* 39 34  ALKPHOS 140* 139* 133* 116 103  BILITOT 1.0 0.8 1.0 1.2 1.1  PROT 5.9* 5.5* 5.7* 5.3* 5.1*  ALBUMIN 2.8* 2.7* 2.7* 2.8* 2.6*   No results for input(s): LIPASE, AMYLASE in the last 168 hours. No results for input(s): AMMONIA in the last 168 hours. Coagulation Profile: Recent Labs  Lab 10/09/19 1443 10/10/19 0311 10/11/19 0707 10/12/19 0353 10/13/19 0500  INR 1.0 1.1 1.1 1.2 1.3*   Cardiac Enzymes: No results for input(s): CKTOTAL, CKMB, CKMBINDEX, TROPONINI in the last 168 hours. BNP (last 3 results) No results for input(s): PROBNP in the last 8760 hours. HbA1C: No results for input(s): HGBA1C in the last 72 hours. CBG: No results for input(s): GLUCAP in the last 168 hours. Lipid Profile: No results for input(s): CHOL, HDL, LDLCALC, TRIG, CHOLHDL, LDLDIRECT in the last 72 hours. Thyroid Function Tests: No results for input(s): TSH, T4TOTAL, FREET4, T3FREE, THYROIDAB in the last 72 hours. Anemia Panel: Recent Labs    10/12/19 0353 10/13/19 0500  FERRITIN 935* 711*   Sepsis Labs: No results for input(s): PROCALCITON, LATICACIDVEN in the last 168 hours.  No results found for this or any previous visit (from the past 240 hour(s)).       Radiology Studies: No results found.      Scheduled Meds: . cephALEXin  500 mg Oral Q8H  . Chlorhexidine Gluconate Cloth  6 each Topical Daily  . famotidine  10 mg Oral BID  . feeding supplement (ENSURE ENLIVE)  237 mL Oral TID BM  . feeding supplement (PRO-STAT SUGAR FREE 64)  30 mL Oral BID  . furosemide  10 mg Oral Daily  . mouth rinse  15 mL Mouth Rinse BID  . metoprolol tartrate  25 mg Oral BID  . sodium chloride flush  10-40 mL Intracatheter Q12H  . warfarin  7.5  mg Oral ONCE-1800  . Warfarin - Pharmacist Dosing Inpatient   Does not apply q1800   Continuous Infusions: . sodium chloride Stopped (10/05/19 0829)     LOS: 15 days    Time spent: 40 minutes    Irine Seal, MD [facility]  If 7PM-7AM, please contact night-coverage [email or web address] 10/13/2019, 11:40 AM

## 2019-10-13 NOTE — Progress Notes (Signed)
[place] for Warfarin Indication: pulmonary embolus   Patient Measurements: Height: 5\' 1"  (154.9 cm) Weight: 225 lb 5 oz (102.2 kg) IBW/kg (Calculated) : 47.8 Heparin Dosing Weight: 71 kg  Vital Signs: Temp: 98.4 F (36.9 C) (01/02 0544) Temp Source: Oral (01/02 0544) BP: 119/65 (01/02 0544)  Labs: Recent Labs    10/11/19 0500 10/11/19 0707 10/12/19 0353 10/13/19 0500  HGB 9.9*  --  9.9* 9.8*  HCT 30.3*  --  30.5* 30.1*  PLT 300  --  283 296  LABPROT  --  14.3 14.9 15.8*  INR  --  1.1 1.2 1.3*  CREATININE 0.85  --  0.54 0.62      Assessment: Pharmacy consulted to dose/monitor heparin in this 49 year old female with PMH significant for breast cancer on chemotherapy with curative intent. CT angio on 12/18 revealed acute bilateral PE, acute occlusive SVC thrombosis.  Transitioned from heparin to warfarin on 12/29 with plan to not bridge since patient has recurrent hemorrhagic pericardial effusion. INR subtherapeutic at 1.3. CBC stable. No bleeding reported.    Goal of Therapy:  INR 2-2.4 per MD request Monitor platelets by anticoagulation protocol: Yes   Plan:   Warfarin 7.5mg  PO x 1 today Daily INR, s/s bleeding  Cristela Felt, PharmD PGY1 Pharmacy Resident [employer]: [phone]  10/13/2019 10:34 AM

## 2019-10-13 NOTE — Progress Notes (Signed)
VASCULAR SURGERY:  The patient developed right upper extremity swelling today.  Her venous duplex scan shows bilateral upper extremity DVTs. I have spoken with Dr. Grandville Silos, who has also spoken with Dr. Roxy Manns.  Given the duplex findings I would agree with starting IV heparin with no bolus.  Continue arm elevation.  No further surgical intervention is indicated.  Deitra Mayo, MD Office: [phone]

## 2019-10-13 NOTE — Progress Notes (Signed)
Bilateral upper extremity venous duplex has been completed. Preliminary results can be found in CV Proc through chart review.  Results were given to the patient's nurse, Ryan. [id number]  10/13/19 4:02 PM Veronica [name] RVT

## 2019-10-13 NOTE — Progress Notes (Signed)
[place] for Warfarin, heparin  Indication: pulmonary embolus   Patient Measurements: Height: 5\' 1"  (154.9 cm) Weight: 225 lb 5 oz (102.2 kg) IBW/kg (Calculated) : 47.8 Heparin Dosing Weight: 71 kg  Vital Signs:    Labs: Recent Labs    10/11/19 0500 10/11/19 0707 10/12/19 0353 10/13/19 0500  HGB 9.9*  --  9.9* 9.8*  HCT 30.3*  --  30.5* 30.1*  PLT 300  --  283 296  LABPROT  --  14.3 14.9 15.8*  INR  --  1.1 1.2 1.3*  CREATININE 0.85  --  0.54 0.62      Assessment: Pharmacy consulted to dose/monitor heparin in this 49 year old female with PMH significant for breast cancer on chemotherapy with curative intent. CT angio on 12/18 revealed acute bilateral PE, acute occlusive SVC thrombosis.  Transitioned from heparin to warfarin on 12/29 with plan to not bridge since patient has recurrent hemorrhagic pericardial effusion. INR subtherapeutic at 1.3.   She has now been found with bilateral DVT. Pharmacy has been asked to restart heparin (no bolus) -Hg= 9.8 -last heparin rate was 1500 units/hr and heparin level was 0.35   Goal of Therapy:  Heparin level = 0.3-0.5 INR 2-2.4 per MD request Monitor platelets by anticoagulation protocol: Yes   Plan:   -Start heparin at 1250 units/hr and increase to goal -Heparin level in 6 hours and daily wth CBC daily  Hildred Laser, PharmD Clinical Pharmacist **Pharmacist phone directory can now be found on [email or web address] ([email or web address]).  Listed under [place].

## 2019-10-13 NOTE — Progress Notes (Signed)
      [address]       [place] [address]             [phone]     CARDIOTHORACIC SURGERY PROGRESS NOTE  11 Days Post-Op  S/P Procedure(s) (LRB): PERCUTANEOUS  SVC THROMBECTOMY USING INARI (N/A) Central VENOGRAM (N/A) Intravascular Ultrasound (N/A) Removal Port-A-Cath Insertion Central Line Adult Ultrasound Guidance For Vascular Access Pericardial Window (N/A)  Subjective: Reported increased swelling right arm w/out worsening SOB  Vital signs in last 24 hours: Temp:  [97.8 F (36.6 C)-98.4 F (36.9 C)] 98.4 F (36.9 C) (01/02 0544) Pulse Rate:  [102] 102 (01/01 2126) Cardiac Rhythm: Normal sinus rhythm (01/02 0712) BP: (119-121)/(65-77) 119/65 (01/02 0544) SpO2:  [97 %] 97 % (01/02 0544) Weight:  [102.2 kg] 102.2 kg (01/02 0500)   Intake/Output from previous day: 01/01 0701 - 01/02 0700 In: 2740 [P.O.:2740] Out: 3200 [Urine:3200] Intake/Output this shift: Total I/O In: 240 [P.O.:240] Out: 600 [Urine:600]  Lab Results: Recent Labs    10/12/19 0353 10/13/19 0500  WBC 11.0* 10.9*  HGB 9.9* 9.8*  HCT 30.5* 30.1*  PLT 283 296   BMET:  Recent Labs    10/12/19 0353 10/13/19 0500  NA 137 137  K 3.8 3.7  CL 102 101  CO2 23 22  GLUCOSE 149* 150*  BUN 6 7  CREATININE 0.54 0.62  CALCIUM 8.9 8.9    CBG (last 3)  No results for input(s): GLUCAP in the last 72 hours. PT/INR:   Recent Labs    10/13/19 0500  LABPROT 15.8*  INR 1.3*    CXR:  N/A  Assessment/Plan: S/P Procedure(s) (LRB): PERCUTANEOUS  SVC THROMBECTOMY USING INARI (N/A) Central VENOGRAM (N/A) Intravascular Ultrasound (N/A) Removal Port-A-Cath Insertion Central Line Adult Ultrasound Guidance For Vascular Access Pericardial Window (N/A)    I.V. heparin stopped 4 days ago due to concerns of possible re-bleeding into pericardial space.  Patient currently receiving oral warfarin but still subtherapeutic.  Swelling in right arm is expected given SVC occlusion and  should be treated primarily by keeping right arm elevated as much as possible.  With occluded SVC the presence of swelling in right arm does not suggest increased risk of PE and the old Port-a-cath has been removed, which was the original source of PE.  I think it's reasonable to treat patient with low dose lovenox while she remains subtherapeutic on warfarin.  I would resume I.V. heparin only if there is evidence of new or increased clot in central venous system.   I doubt that upper extremity duplex would be helpful.  Consider f/u CTA as an alternative if there are concerns regarding possible increased central venous clot or PE.  Would check f/u ECHO prior to resuming I.V. heparin.     Rexene Alberts, MD 10/13/2019 3:36 PM

## 2019-10-14 LAB — CBC WITH DIFFERENTIAL/PLATELET
Abs Immature Granulocytes: 0.8 10*3/uL — ABNORMAL HIGH (ref 0.00–0.07)
Basophils Absolute: 0.1 10*3/uL (ref 0.0–0.1)
Basophils Relative: 1 %
Eosinophils Absolute: 0 10*3/uL (ref 0.0–0.5)
Eosinophils Relative: 0 %
HCT: 33.8 % — ABNORMAL LOW (ref 36.0–46.0)
Hemoglobin: 11.1 g/dL — ABNORMAL LOW (ref 12.0–15.0)
Lymphocytes Relative: 2 %
Lymphs Abs: 0.3 10*3/uL — ABNORMAL LOW (ref 0.7–4.0)
MCH: 30 pg (ref 26.0–34.0)
MCHC: 32.8 g/dL (ref 30.0–36.0)
MCV: 91.4 fL (ref 80.0–100.0)
Metamyelocytes Relative: 2 %
Monocytes Absolute: 0.5 10*3/uL (ref 0.1–1.0)
Monocytes Relative: 4 %
Myelocytes: 4 %
Neutro Abs: 10.9 10*3/uL — ABNORMAL HIGH (ref 1.7–7.7)
Neutrophils Relative %: 87 %
Platelets: 375 10*3/uL (ref 150–400)
RBC: 3.7 MIL/uL — ABNORMAL LOW (ref 3.87–5.11)
RDW: 15.8 % — ABNORMAL HIGH (ref 11.5–15.5)
WBC: 12.5 10*3/uL — ABNORMAL HIGH (ref 4.0–10.5)
nRBC: 0 /100 WBC
nRBC: 0.3 % — ABNORMAL HIGH (ref 0.0–0.2)

## 2019-10-14 LAB — COMPREHENSIVE METABOLIC PANEL
ALT: 32 U/L (ref 0–44)
AST: 27 U/L (ref 15–41)
Albumin: 2.8 g/dL — ABNORMAL LOW (ref 3.5–5.0)
Alkaline Phosphatase: 98 U/L (ref 38–126)
Anion gap: 11 (ref 5–15)
BUN: 6 mg/dL (ref 6–20)
CO2: 24 mmol/L (ref 22–32)
Calcium: 8.9 mg/dL (ref 8.9–10.3)
Chloride: 105 mmol/L (ref 98–111)
Creatinine, Ser: 0.72 mg/dL (ref 0.44–1.00)
GFR calc Af Amer: >60 mL/min (ref >60)
GFR calc non Af Amer: >60 mL/min (ref >60)
Glucose, Bld: 159 mg/dL — ABNORMAL HIGH (ref 70–99)
Potassium: 3.5 mmol/L (ref 3.5–5.1)
Sodium: 140 mmol/L (ref 135–145)
Total Bilirubin: 1.1 mg/dL (ref 0.3–1.2)
Total Protein: 5.8 g/dL — ABNORMAL LOW (ref 6.5–8.1)

## 2019-10-14 LAB — PHOSPHORUS: Phosphorus: 4.1 mg/dL (ref 2.5–4.6)

## 2019-10-14 LAB — MAGNESIUM: Magnesium: 2 mg/dL (ref 1.7–2.4)

## 2019-10-14 LAB — C-REACTIVE PROTEIN: CRP: 4.4 mg/dL — ABNORMAL HIGH (ref <1.0)

## 2019-10-14 LAB — HEPARIN LEVEL (UNFRACTIONATED): Heparin Unfractionated: 0.25 IU/mL — ABNORMAL LOW (ref 0.30–0.70)

## 2019-10-14 LAB — PROTIME-INR
INR: 1.3 — ABNORMAL HIGH (ref 0.8–1.2)
Prothrombin Time: 16.2 seconds — ABNORMAL HIGH (ref 11.4–15.2)

## 2019-10-14 LAB — D-DIMER, QUANTITATIVE: D-Dimer, Quant: 6.6 ug/mL-FEU — ABNORMAL HIGH (ref 0.00–0.50)

## 2019-10-14 LAB — FERRITIN: Ferritin: 760 ng/mL — ABNORMAL HIGH (ref 11–307)

## 2019-10-14 MED ORDER — WARFARIN SODIUM 7.5 MG PO TABS
7.5000 mg | ORAL_TABLET | Freq: Once | ORAL | Status: AC
  Administered 2019-10-14: 7.5 mg via ORAL
  Filled 2019-10-14: qty 1

## 2019-10-14 MED ORDER — HEPARIN (PORCINE) 25000 UT/250ML-% IV SOLN
1400.0000 [IU]/h | INTRAVENOUS | Status: AC
  Administered 2019-10-14: 1250 [IU]/h via INTRAVENOUS
  Administered 2019-10-14: 1400 [IU]/h via INTRAVENOUS
  Filled 2019-10-14: qty 250

## 2019-10-14 MED ORDER — ENOXAPARIN SODIUM 100 MG/ML ~~LOC~~ SOLN
100.0000 mg | Freq: Two times a day (BID) | SUBCUTANEOUS | Status: DC
  Filled 2019-10-14: qty 1

## 2019-10-14 NOTE — Progress Notes (Signed)
[place] for Warfarin, heparin  Indication: pulmonary embolus   Patient Measurements: Height: 5\' 1"  (154.9 cm) Weight: 225 lb 5 oz (102.2 kg) IBW/kg (Calculated) : 47.8 Heparin Dosing Weight: 71 kg  Vital Signs: Temp: 98.5 F (36.9 C) (01/03 0537) Temp Source: Oral (01/03 0537) BP: 122/106 (01/03 0537) Pulse Rate: 100 (01/02 2050)  Labs: Recent Labs    10/12/19 0353 10/13/19 0500  HGB 9.9* 9.8*  HCT 30.5* 30.1*  PLT 283 296  LABPROT 14.9 15.8*  INR 1.2 1.3*  CREATININE 0.54 0.62      Assessment: Pharmacy consulted to dose/monitor heparin in this 49 year old female with PMH significant for breast cancer on chemotherapy with curative intent. CT angio on 12/18 revealed acute bilateral PE, acute occlusive SVC thrombosis and right lower extremity DVT.  Transitioned from heparin to warfarin on 12/29 with plan to not bridge since patient has recurrent hemorrhagic pericardial effusion. Yesterday patient found to have bilateral DVTs in right and left upper extremities. Pharmacy consulted to restart heparin when bilateral upper extremity DVTS found yesterday and dose warfarin for VTE treatment. Heparin level to be collected at 0200 delayed ~12 hrs due to phlebotomy unable to obtain and after discussion with Dr. Rodena Piety planned to transition from heparin to enoxaparin given difficulty with labs. Heparin was stopped at ~1200 and after dicussion with Dr. Rodena Piety desired to resume heparin and not transition to enoxaparin given Dr Guy Sandifer recommendation. CBC stable. No reported bleeding.   INR 1.3 is subtherapeutic.    Goal of Therapy:  Heparin level = 0.3-0.5 (per MD request) INR 2-2.4 per MD request Monitor platelets by anticoagulation protocol: Yes   Plan:   Resume heparin 1250 units/hr  Check heparin level at 1900 today  Warfarin 7.5mg  x1 tonight  Monitor INR, heparin level, CBC, and S/S of bleeding daily    Cristela Felt, PharmD PGY1  Pharmacy Resident [employer]: [phone]

## 2019-10-14 NOTE — Progress Notes (Addendum)
TCTS BRIEF PROGRESS NOTE  12 Days Post-Op  S/P Procedure(s) (LRB): PERCUTANEOUS  SVC THROMBECTOMY USING INARI (N/A) Central VENOGRAM (N/A) Intravascular Ultrasound (N/A) Removal Port-A-Cath Insertion Central Line Adult Ultrasound Guidance For Vascular Access Pericardial Window (N/A)   I have personally reviewed the f/u CTA chest performed last night and agree w/ restarting IV heparin while waiting for patient to become therapeutic on warfarin.  I would favor using heparin rather than lovenox because of the ability to measure it's activity directly and rapidly reverse it should significant bleeding develop.  Will plan f/u echocardiogram tomorrow but there did not appear to be a significant pericardial effusion on CTA   Rexene Alberts, MD 10/14/2019 12:16 PM

## 2019-10-14 NOTE — Progress Notes (Signed)
[place] for Warfarin, heparin  Indication: pulmonary embolus   Patient Measurements: Height: 5\' 1"  (154.9 cm) Weight: 225 lb 5 oz (102.2 kg) IBW/kg (Calculated) : 47.8 Heparin Dosing Weight: 71 kg  Vital Signs: Temp: 97.8 F (36.6 C) (01/03 1408) Temp Source: Oral (01/03 1408) BP: 119/74 (01/03 1408) Pulse Rate: 74 (01/03 1408)  Labs: Recent Labs    10/12/19 0353 10/13/19 0500 10/14/19 1220 10/14/19 2202  HGB 9.9* 9.8* 11.1*  --   HCT 30.5* 30.1* 33.8*  --   PLT 283 296 375  --   LABPROT 14.9 15.8* 16.2*  --   INR 1.2 1.3* 1.3*  --   HEPARINUNFRC  --   --   --  0.25*  CREATININE 0.54 0.62 0.72  --       Assessment: Pharmacy consulted to dose/monitor heparin in this 49 year old female with PMH significant for breast cancer on chemotherapy with curative intent. CT angio on 12/18 revealed acute bilateral PE, acute occlusive SVC thrombosis and right lower extremity DVT.  Transitioned from heparin to warfarin on 12/29 with plan to not bridge since patient has recurrent hemorrhagic pericardial effusion. Also found to have bilateral DVTs in right and left upper extremities. Pharmacy dose heparin and Warfarin. -heparin level= 0.25  Goal of Therapy:  Heparin level = 0.3-0.5 (per MD request) INR 2-2.4 per MD request Monitor platelets by anticoagulation protocol: Yes   Plan:   -increase heparin to 1400 units/hr -Heparin level in 6 hours and daily wth CBC daily  Hildred Laser, PharmD Clinical Pharmacist **Pharmacist phone directory can now be found on [email or web address] ([email or web address]).  Listed under [place].

## 2019-10-14 NOTE — Progress Notes (Addendum)
      [address]       [place] [address]             [phone]     The sub-xyphoid wound was noted to be separated on rounds today. The subcutaneous suture was exposed along the lower 2/3 of the incision. The suture was removed allowing debridement of some loose , superficial devitalized fat. The wound was moist but not actively draining and there were no bleeding surfaces. The wound was gently packed with a sterile gauze and a dry cover dressing.  Will discuss with Dr. Cyndia Bent tomorrow. May need further debridement in the OR and possibly a wound vac.   Enid Cutter, [place] [phone]

## 2019-10-14 NOTE — Progress Notes (Signed)
PROGRESS NOTE    Veronica [name]  [id number] DOB: 09-Sep-1971 DOA: 09/28/2019 PCP: Blair Heys, PA-C    Brief Narrative:49 yo female admitted with PE and SVC syndrome with hx of breast cancer. Had thrombectomy on [id number] complicated by hemopericardium and tamponade requiring subxyphoid pericardial window. Incidentally found to be positive for COVID 19  Assessment & Plan:   Principal Problem:   Multiple subsegmental pulmonary emboli without acute cor pulmonale (HCC) Active Problems:   Malignant neoplasm of upper-outer quadrant of right breast in female, estrogen receptor positive (HCC)   Thrombocytopenia (HCC)   Superior vena cava thrombosis (HCC)   SVCO (superior vena cava obstruction)   SVC syndrome   Sinus tachycardia   Essential hypertension   Gastroesophageal reflux disease   1 acute PE with acute SVC syndrome status post mechanical thrombectomy complicated by hemopericardium with tamponade status post xiphoid window./Right lower extremity DVT Pericardial drain removed by cardiothoracic surgery on 10/07/2019. Patient being followed by vascular and cardiothoracic surgery.  Repeat 2D echo with a recurrent small pericardial effusion with normal EF.  Patient was on heparin which has been discontinued by cardiothoracic surgery and patient started on Coumadin with goal INR 2-2.4 due to concern for recurrent pericardial effusion.   INR pending for today Received Keflex for cellulitis at the incision site last dose was October 14, 2019.   Per cardiothoracic surgery repeat 2D echo on October 15, 2019 to follow-up on recurrent pericardial effusion/hemopericardium.   Patient with complaints of increasing swelling in the right upper extremity with tightening in the right forearm.  Dr. Grandville Silos discussed with cardiothoracic surgery-who recommended to get a right upper extremity ultrasound and CT angiogram of the chest. Right upper extremity Doppler-acute DVT involving the right internal  jugular right subclavian right axillary and right brachial veins and radial veins and ulnar veins.  She also has acute superficial vein thrombosis.  Left upper extremity findings consistent with acute DVT involving the left axillary and left brachial veins and acute superficial vein thrombosis of the left cephalic vein.  CT angiogram of the chest shows-acute bilateral PE.  CT evidence of right heart strain.  Persistent but improved thrombus within the SVC.  Growing bilateral pleural effusion moderate to large on the right and small to moderate on the left.  Complete collapse of the right lower lobe.   2.  Sinus tachycardia/history of hypertension-pressure 122/106 with a heart rate of 100.  Continue Lopressor as.  Benicar on hold.  3.  Atelectasis, bilateral pleural effusions/bilateral lower extremity edema/right lower extremity DVT-CT evidence of bilateral pleural effusion right more than left.  Consult interventional radiology for thoracentesis.  4.  COVID-19 infection with initial concern for COVID-19 pneumonia Status post full course of remdesivir. Patient with sats of 93% on 2 L compared to yesterday she was on room air and saturating 96%.  CRP and ferritin trending down.  Ferritin elevated.  Follow.   5.  Chemotherapy-induced pancytopenia/A ER positive breast cancer Pancytopenia improved.  Hemoglobin currently at 9.8, platelet count at 296.  Patient denies any overt bleeding.  Per oncology transfuse for hemoglobin < 8 and platelet count  < 20.  Oncology following and appreciate input and recommendations.  Outpatient follow-up with oncology.  6.  History of gastroesophageal reflux disease Pepcid.       Nutrition Problem: Increased nutrient needs Etiology: chronic illness, acute illness(breast cancer undergoing chemotherapy, COVID-19)     Signs/Symptoms: estimated needs    Interventions: Ensure Enlive (each supplement provides 350kcal and 20 grams  of protein), Magic cup,  Prostat  Estimated body mass index is 42.57 kg/m as calculated from the following:   Height as of this encounter: 5\' 1"  (1.549 m).   Weight as of this encounter: 102.2 kg.  DVT prophylaxis: Coumadin/heparin Code Status: Full Family Communication: Updated patient.  No family at bedside. Disposition Plan: Home when clinically improved, stable, INR therapeutic and when cleared by cardiothoracic surgery.   Consultants:  Vascular, TCTS, Oncology  Procedures:  CTA chest 12/18 > bilateral lower lobe PE, SVC occlusion, probable RLL infarct, small b/l effusions. CT head 12/18 > no acute findings. Echo 12/20 > EF 60-65%, small pericardial effusion. Echo 12/22 (post thrombectomy) >significantly reduced clot burden in the SVC with improved flow on the venogram. Large expanding circumferential pericardial effusion with evidence of tamponade. Echo 12/22 (post pericardial window) > resolution of pericardial effusion without evidence of tamponade, LVEF > 65%, small clot burden in SVC. ETT 12/22 >12/23 L radial art line 12/22 > Right femoral CVL >> 12/25 2D echo 10/08/2019 Bilateral upper extremity Dopplers pending.  Significant Hospital Events   12/18 admit. 12/22 to OR for thrombectomy which was complicated bylarge hemopericardium with tamponade requiring subxyphoid pericardial window. 12/23 extubated 12/25 add lopressor for tachycardia 12/27 pericardial drain removed.   Antimicrobials:  Remdesivir 12/21 >> 12/25 Keflex 10/07/2019>>> 10/14/2019   Subjective:  Resting in bed swelling in both left and  right upper extremity Feels breathing is okay denies any chest pain  Objective: Vitals:   10/13/19 0544 10/13/19 2000 10/13/19 2050 10/14/19 0537  BP: 119/65 106/68 106/68 (!) 122/106  Pulse:   100   Resp:   20 19  Temp: 98.4 F (36.9 C)  98 F (36.7 C) 98.5 F (36.9 C)  TempSrc: Oral  Oral Oral  SpO2: 97% 93%    Weight:      Height:        Intake/Output Summary  (Last 24 hours) at 10/14/2019 1209 Last data filed at 10/14/2019 1050 Gross per 24 hour  Intake 1157.5 ml  Output 2200 ml  Net -1042.5 ml   Filed Weights   09/28/19 1659 10/12/19 0500 10/13/19 0500  Weight: 95.7 kg 104.6 kg 102.2 kg    Examination:  General exam: Appears calm and comfortable  Respiratory system: Diminished breath sounds at the bases to auscultation. Respiratory effort normal. Cardiovascular system: S1 & S2 heard, RRR. No JVD, murmurs, rubs, gallops or clicks. No pedal edema.  Bandage in the subxiphoid area. Gastrointestinal system: Abdomen is nondistended, soft and nontender. No organomegaly or masses felt. Normal bowel sounds heard. Central nervous system: Alert and oriented. No focal neurological deficits. Extremities: Bilateral upper extremity swelling right more than left.  Right lower extremity swelling more compared to left. Skin: No rashes, lesions or ulcers Psychiatry: Judgement and insight appear normal. Mood & affect appropriate.     Data Reviewed: I have personally reviewed following labs and imaging studies  CBC: Recent Labs  Lab 10/09/19 1443 10/10/19 0311 10/11/19 0500 10/12/19 0353 10/13/19 0500  WBC 15.7* 11.4* 11.9* 11.0* 10.9*  NEUTROABS 13.0* 7.7 10.2* 7.1 7.4  HGB 10.7* 10.2* 9.9* 9.9* 9.8*  HCT 32.9* 30.7* 30.3* 30.5* 30.1*  MCV 91.4 89.8 90.7 91.6 91.2  PLT 277 251 300 283 0000000   Basic Metabolic Panel: Recent Labs  Lab 10/09/19 1443 10/10/19 0311 10/11/19 0500 10/12/19 0353 10/13/19 0500  NA 135 137 137 137 137  K 4.0 4.1 4.0 3.8 3.7  CL 101 102 103 102 101  CO2  23 20* 23 23 22   GLUCOSE 198* 167* 167* 149* 150*  BUN 9 8 9 6 7   CREATININE 0.81 0.72 0.85 0.54 0.62  CALCIUM 9.2 9.2 9.2 8.9 8.9  MG 1.9 2.0 2.0 1.9 2.0  PHOS 5.0* 5.5* 5.4* 4.7* 4.9*   GFR: Estimated Creatinine Clearance: 96.5 mL/min (by C-G formula based on SCr of 0.62 mg/dL). Liver Function Tests: Recent Labs  Lab 10/09/19 1443 10/10/19 0311  10/11/19 0500 10/12/19 0353 10/13/19 0500  AST 52* 32 27 26 23   ALT 56* 50* 46* 39 34  ALKPHOS 140* 139* 133* 116 103  BILITOT 1.0 0.8 1.0 1.2 1.1  PROT 5.9* 5.5* 5.7* 5.3* 5.1*  ALBUMIN 2.8* 2.7* 2.7* 2.8* 2.6*   No results for input(s): LIPASE, AMYLASE in the last 168 hours. No results for input(s): AMMONIA in the last 168 hours. Coagulation Profile: Recent Labs  Lab 10/09/19 1443 10/10/19 0311 10/11/19 0707 10/12/19 0353 10/13/19 0500  INR 1.0 1.1 1.1 1.2 1.3*   Cardiac Enzymes: No results for input(s): CKTOTAL, CKMB, CKMBINDEX, TROPONINI in the last 168 hours. BNP (last 3 results) No results for input(s): PROBNP in the last 8760 hours. HbA1C: No results for input(s): HGBA1C in the last 72 hours. CBG: No results for input(s): GLUCAP in the last 168 hours. Lipid Profile: No results for input(s): CHOL, HDL, LDLCALC, TRIG, CHOLHDL, LDLDIRECT in the last 72 hours. Thyroid Function Tests: No results for input(s): TSH, T4TOTAL, FREET4, T3FREE, THYROIDAB in the last 72 hours. Anemia Panel: Recent Labs    10/12/19 0353 10/13/19 0500  FERRITIN 935* 711*   Sepsis Labs: No results for input(s): PROCALCITON, LATICACIDVEN in the last 168 hours.  No results found for this or any previous visit (from the past 240 hour(s)).       Radiology Studies: CT ANGIO CHEST PE W OR WO CONTRAST  Addendum Date: 10/13/2019   ADDENDUM REPORT: 10/13/2019 23:36 ADDENDUM: These results were called by telephone at the time of interpretation on 10/13/2019 at 11:36 pm to provider NP Kennon Holter, who verbally acknowledged these results. Electronically Signed   By: Constance Holster M.D.   On: 10/13/2019 23:36   Result Date: 10/13/2019 CLINICAL DATA:  Concern for PE. Concern SVC syndrome with history of breast cancer in known DVT. EXAM: CT ANGIOGRAPHY CHEST WITH CONTRAST TECHNIQUE: Multidetector CT imaging of the chest was performed using the standard protocol during bolus administration of intravenous  contrast. Multiplanar CT image reconstructions and MIPs were obtained to evaluate the vascular anatomy. CONTRAST:  4mL OMNIPAQUE IOHEXOL 350 MG/ML SOLN COMPARISON:  September 28, 2019 FINDINGS: Cardiovascular: Contrast injection is sufficient to demonstrate satisfactory opacification of the pulmonary arteries to the segmental level.There are persistent acute segmental pulmonary emboli involving the left lower lobe. There are scattered bilateral subsegmental and segmental pulmonary emboli involving the bilateral upper lobes. There appears to be lobar thrombus within the right upper lobe pulmonary artery. There is evidence for right-sided heart strain with an RV LV ratio measuring approximately 1.1. The heart size is normal. Again noted is thrombus within the SVC, improved from prior study. The right brachiocephalic vein is not well evaluated secondary to contrast timing. There may be superior extension of thrombus into this vein. Aortic calcifications are noted. There are postsurgical changes of the mediastinum likely related to open thrombectomy. Mediastinum/Nodes: --there are few mildly enlarged mediastinal lymph nodes. --No axillary lymphadenopathy. --No supraclavicular lymphadenopathy. --Normal thyroid gland. --The esophagus is unremarkable Lungs/Pleura: There is a moderate to large right-sided pleural effusion, increased  from prior study. There is a small to moderate-sized left-sided pleural effusion, increased from prior study. There is complete collapse of the right lower lobe. There is atelectasis at the left lung base. There is no pneumothorax. Upper Abdomen: No acute abnormality. Musculoskeletal: There is extensive overlying skin thickening involving the right breast. There is a right breast mass measuring approximately 8.1 by 4.8 cm and approximately 0 Hounsfield units. There is no acute displaced fracture. Review of the MIP images confirms the above findings. IMPRESSION: 1. Acute bilateral pulmonary  emboli are again noted as detailed above. There is CT evidence for right-sided heart strain with an RV/LV ratio measuring approximately 1.1. 2. Persistent but improved thrombus within the SVC. 3. Growing bilateral pleural effusions now moderate to large on the right and small to moderate-sized on the left. 4. Complete collapse of the right lower lobe. 5. Low-attenuation right-sided breast mass as detailed above. This could represent a postoperative seroma or hematoma in the appropriate clinical setting. An abscess is not entirely excluded. There is significant asymmetric skin thickening overlying the right breast likely related to the patient's reported history of breast cancer. Electronically Signed: By: Constance Holster M.D. On: 10/13/2019 23:24   VAS [place] UPPER EXTREMITY VENOUS DUPLEX  Result Date: 10/13/2019 UPPER VENOUS STUDY  Indications: Swelling, and SVC syndrome Risk Factors: Cancer. Comparison Study: No prior studies. Performing Technologist: Oliver Hum RVT  Examination Guidelines: A complete evaluation includes B-mode imaging, spectral Doppler, color Doppler, and power Doppler as needed of all accessible portions of each vessel. Bilateral testing is considered an integral part of a complete examination. Limited examinations for reoccurring indications may be performed as noted.  Right Findings: +----------+------------+---------+-----------+----------+-------+ RIGHT     CompressiblePhasicitySpontaneousPropertiesSummary +----------+------------+---------+-----------+----------+-------+ IJV           None       No        No                Acute  +----------+------------+---------+-----------+----------+-------+ Subclavian    None       No        No                Acute  +----------+------------+---------+-----------+----------+-------+ Axillary      None       No        No                Acute  +----------+------------+---------+-----------+----------+-------+ Brachial     Partial      No        No                Acute  +----------+------------+---------+-----------+----------+-------+ Radial      Partial                                  Acute  +----------+------------+---------+-----------+----------+-------+ Ulnar       Partial                                  Acute  +----------+------------+---------+-----------+----------+-------+ Cephalic      Full                                          +----------+------------+---------+-----------+----------+-------+ Basilic  None                                   Acute  +----------+------------+---------+-----------+----------+-------+  Left Findings: +----------+------------+---------+-----------+----------+-------+ LEFT      CompressiblePhasicitySpontaneousPropertiesSummary +----------+------------+---------+-----------+----------+-------+ IJV           Full       Yes       Yes                      +----------+------------+---------+-----------+----------+-------+ Subclavian    Full       Yes       Yes                      +----------+------------+---------+-----------+----------+-------+ Axillary      None       No        No                Acute  +----------+------------+---------+-----------+----------+-------+ Brachial    Partial      No        No                Acute  +----------+------------+---------+-----------+----------+-------+ Radial        Full                                          +----------+------------+---------+-----------+----------+-------+ Ulnar         Full                                          +----------+------------+---------+-----------+----------+-------+ Cephalic      None                                   Acute  +----------+------------+---------+-----------+----------+-------+ Basilic       Full                                          +----------+------------+---------+-----------+----------+-------+   Summary:  Right: Findings consistent with acute deep vein thrombosis involving the right internal jugular vein, right subclavian vein, right axillary vein, right brachial veins, right radial veins and right ulnar veins. Findings consistent with acute superficial vein thrombosis involving the right basilic vein.  Left: Findings consistent with acute deep vein thrombosis involving the left axillary vein and left brachial veins. Findings consistent with acute superficial vein thrombosis involving the left cephalic vein.  *See table(s) above for measurements and observations.  Diagnosing physician: Deitra Mayo MD Electronically signed by Deitra Mayo MD on 10/13/2019 at 4:31:10 PM.    Final         Scheduled Meds: . cephALEXin  500 mg Oral Q8H  . Chlorhexidine Gluconate Cloth  6 each Topical Daily  . enoxaparin (LOVENOX) injection  100 mg Subcutaneous Q12H  . famotidine  10 mg Oral BID  . feeding supplement (ENSURE ENLIVE)  237 mL Oral TID BM  . feeding supplement (PRO-STAT SUGAR FREE 64)  30 mL Oral BID  . furosemide  10 mg Oral Daily  . mouth rinse  15 mL Mouth Rinse BID  . metoprolol tartrate  25 mg Oral BID  . sodium chloride flush  10-40 mL Intracatheter Q12H  . Warfarin - Pharmacist Dosing Inpatient   Does not apply q1800   Continuous Infusions: . sodium chloride Stopped (10/05/19 0829)     LOS: 16 days     Georgette Shell, MD [facility] If 7PM-7AM, please contact night-coverage [email or web address] Password [facility] 10/14/2019, 12:09 PM

## 2019-10-15 ENCOUNTER — Inpatient Hospital Stay (HOSPITAL_COMMUNITY): Payer: 59

## 2019-10-15 ENCOUNTER — Telehealth: Payer: Self-pay | Admitting: Oncology

## 2019-10-15 DIAGNOSIS — I361 Nonrheumatic tricuspid (valve) insufficiency: Secondary | ICD-10-CM

## 2019-10-15 DIAGNOSIS — J90 Pleural effusion, not elsewhere classified: Secondary | ICD-10-CM

## 2019-10-15 LAB — CBC
HCT: 31.1 % — ABNORMAL LOW (ref 36.0–46.0)
Hemoglobin: 9.8 g/dL — ABNORMAL LOW (ref 12.0–15.0)
MCH: 29.3 pg (ref 26.0–34.0)
MCHC: 31.5 g/dL (ref 30.0–36.0)
MCV: 93.1 fL (ref 80.0–100.0)
Platelets: 324 10*3/uL (ref 150–400)
RBC: 3.34 MIL/uL — ABNORMAL LOW (ref 3.87–5.11)
RDW: 15.8 % — ABNORMAL HIGH (ref 11.5–15.5)
WBC: 7.7 10*3/uL (ref 4.0–10.5)
nRBC: 0.4 % — ABNORMAL HIGH (ref 0.0–0.2)

## 2019-10-15 LAB — PROTEIN, PLEURAL OR PERITONEAL FLUID: Total protein, fluid: <3 g/dL

## 2019-10-15 LAB — BODY FLUID CELL COUNT WITH DIFFERENTIAL
Eos, Fluid: 1 %
Lymphs, Fluid: 54 %
Monocyte-Macrophage-Serous Fluid: 7 % — ABNORMAL LOW (ref 50–90)
Neutrophil Count, Fluid: 38 % — ABNORMAL HIGH (ref 0–25)
Total Nucleated Cell Count, Fluid: 705 cu mm (ref 0–1000)

## 2019-10-15 LAB — FERRITIN: Ferritin: 706 ng/mL — ABNORMAL HIGH (ref 11–307)

## 2019-10-15 LAB — LACTATE DEHYDROGENASE, PLEURAL OR PERITONEAL FLUID: LD, Fluid: 95 U/L — ABNORMAL HIGH (ref 3–23)

## 2019-10-15 LAB — ECHOCARDIOGRAM LIMITED
Height: 61 in
Weight: 3604.96 oz

## 2019-10-15 LAB — PROTIME-INR
INR: 1.4 — ABNORMAL HIGH (ref 0.8–1.2)
Prothrombin Time: 16.8 seconds — ABNORMAL HIGH (ref 11.4–15.2)

## 2019-10-15 LAB — GLUCOSE, PLEURAL OR PERITONEAL FLUID: Glucose, Fluid: 181 mg/dL

## 2019-10-15 LAB — PHOSPHORUS: Phosphorus: 4.9 mg/dL — ABNORMAL HIGH (ref 2.5–4.6)

## 2019-10-15 LAB — D-DIMER, QUANTITATIVE: D-Dimer, Quant: 4.96 ug/mL-FEU — ABNORMAL HIGH (ref 0.00–0.50)

## 2019-10-15 LAB — C-REACTIVE PROTEIN: CRP: 3.4 mg/dL — ABNORMAL HIGH (ref <1.0)

## 2019-10-15 LAB — HEPARIN LEVEL (UNFRACTIONATED): Heparin Unfractionated: 0.38 IU/mL (ref 0.30–0.70)

## 2019-10-15 IMAGING — DX DG CHEST 1V
1 series · 1 of 1 positions shown · non-contrast
Comparison: [DATE]

CLINICAL DATA: Post thoracentesis

EXAM:
CHEST  1 VIEW

[chest]
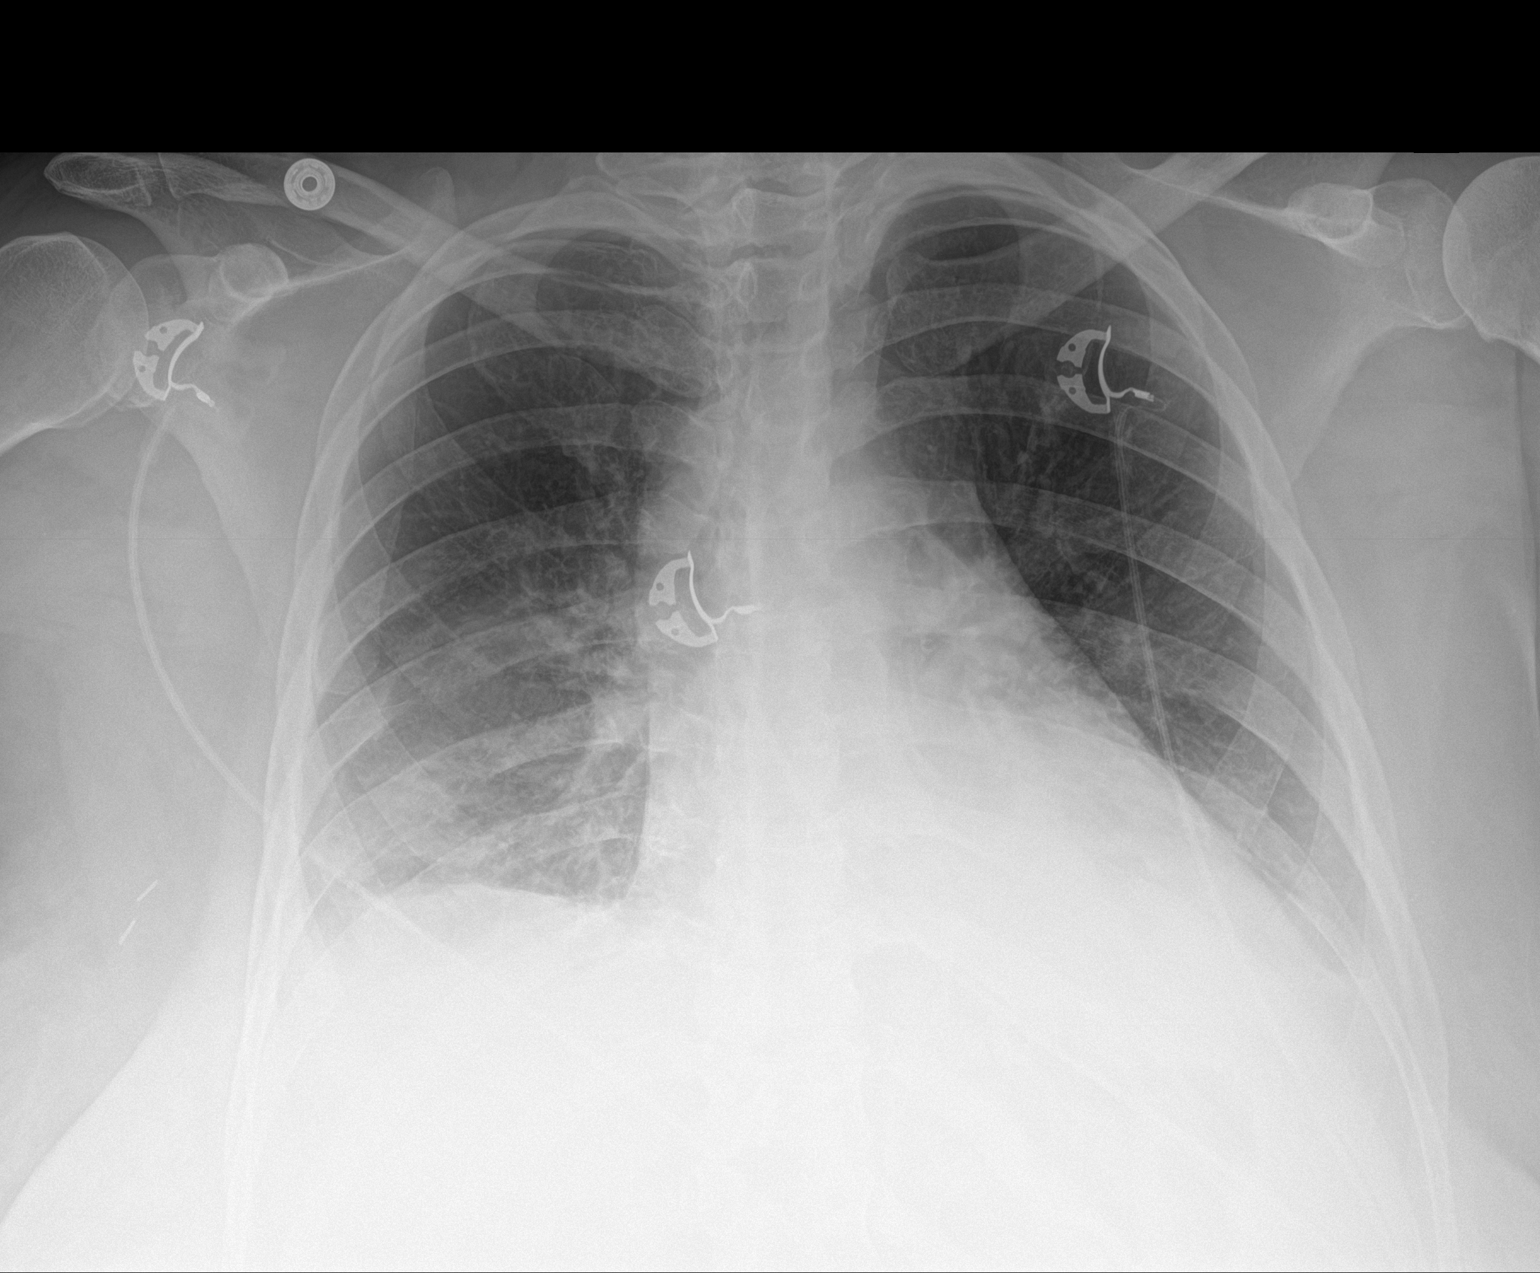

[1 of 1 positions shown; findings below may reference images not displayed]

FINDINGS: Improvement in small bilateral effusions and bibasilar atelectasis.
No pneumothorax post thoracentesis.
IMPRESSION: No pneumothorax post thoracentesis.

Improvement in bilateral effusions and bibasilar consolidation.

## 2019-10-15 MED ORDER — HEPARIN (PORCINE) 25000 UT/250ML-% IV SOLN
1550.0000 [IU]/h | INTRAVENOUS | Status: DC
  Administered 2019-10-16: 1500 [IU]/h via INTRAVENOUS
  Administered 2019-10-17 – 2019-10-18 (×2): 1550 [IU]/h via INTRAVENOUS
  Filled 2019-10-15 (×4): qty 250

## 2019-10-15 NOTE — Progress Notes (Signed)
   NAME:  Veronica [name], MRN:  [id number], DOB:  [birth year], LOS: 67 ADMISSION DATE:  09/28/2019, CONSULTATION DATE:  12/22 REFERRING MD:  Dr. Louanne Belton , CHIEF COMPLAINT:  PE   Brief History   49 yo female admitted with PE and SVC syndrome with hx of breast cancer.  Had thrombectomy on [id number] complicated by hemopericardium and tamponade requiring subxyphoid pericardial window.  Incidentally found to be positive for COVID 19.  Past Medical History  Breast cancer ER positive, HTN  Significant Hospital Events   12/18 admit. 12/22 to OR for thrombectomy which was complicated by large hemopericardium with tamponade requiring subxyphoid pericardial window. 12/23 extubated 12/25 add lopressor for tachycardia  Consults:  Vascular, TCTS, Oncology  Procedures:  ETT 12/22 > 12/23 L radial art line 12/22 >  Right femoral CVL >> 12/25  Significant Diagnostic Tests:  CTA chest 12/18 > bilateral lower lobe PE, SVC occlusion, probable RLL infarct, small b/l effusions. CT head 12/18 > no acute findings. Echo 12/20 > EF 60-65%, small pericardial effusion. Echo 12/22 (post thrombectomy) > significantly reduced clot burden in the SVC with improved flow on the venogram.  Large expanding circumferential pericardial effusion with evidence of tamponade. Echo 12/22 (post pericardial window) > resolution of pericardial effusion without evidence of tamponade, LVEF > 65%, small clot burden in SVC.  Micro Data:  SARS CoV2 12/18 > positive.  Antimicrobials/COVID19 treatment  Remdesivir 12/21 >> 12/25  Interim history/subjective:  Reconsulted for pleural effusion. Patient dyspneic with activity but otherwise okay at rest. Has leg swelling and RUE swelling  Objective   Blood pressure 110/72, pulse 92, temperature 98 F (36.7 C), temperature source Oral, resp. rate 18, height 5\' 1"  (1.549 m), weight 102.2 kg, SpO2 95 %.        Intake/Output Summary (Last 24 hours) at 10/15/2019 1557 Last data filed at  10/15/2019 1045 Gross per 24 hour  Intake 849.36 ml  Output 2600 ml  Net -1750.64 ml   Filed Weights   09/28/19 1659 10/12/19 0500 10/13/19 0500  Weight: 95.7 kg 104.6 kg 102.2 kg    Examination:  GEN: cushingnoid woman in NAD HEENT: MMM, malampatti 3 CV: RRR, ext warm PULM: Diminished bases, R>L effusions GI: Soft, dehiscence in pericardial window site EXT: 2+ edema bilaterally, RUE ext edema NEURO: Moves all 4 ext to command PSYCH: RASS 0 SKIN: No rashes   Resolved Hospital Problem list   Acute respiratory failure with hypoxia  Assessment/plan:  Pleural effusion- likely related to recent intra-thoracic issues and third spacing, will tap and send for usual studies including cytology given breast cancer history. - 600 cc drained from R pleural space - Further re-accumulation will be helped with maintaining euvolemia and nutrition - Will f/u pleural fluid studies peripherally and reach out if something further needs to be done  Erskine Emery MD PCCM

## 2019-10-15 NOTE — Progress Notes (Addendum)
13 Days Post-Op Procedure(s) (LRB): PERCUTANEOUS  SVC THROMBECTOMY USING INARI (N/A) Central VENOGRAM (N/A) Intravascular Ultrasound (N/A) Removal Port-A-Cath Insertion Central Line Adult Ultrasound Guidance For Vascular Access Pericardial Window (N/A) Subjective: Feels ok. Waiting to have thoracentesis today.  Objective: Vital signs in last 24 hours: Temp:  [98 F (36.7 C)-98.3 F (36.8 C)] 98 F (36.7 C) (01/04 0541) Pulse Rate:  [92-100] 92 (01/04 0541) Cardiac Rhythm: Normal sinus rhythm (01/04 0700) Resp:  [18] 18 (01/03 2030) BP: (110)/(72) 110/72 (01/03 2030)  Hemodynamic parameters for last 24 hours:    Intake/Output from previous day: 01/03 0701 - 01/04 0700 In: 1569.4 [P.O.:1360; I.V.:209.4] Out: 2800 [Urine:2800] Intake/Output this shift: Total I/O In: -  Out: 600 [Urine:600]  General appearance: alert and cooperative Heart: regular rate and rhythm, S1, S2 normal, no murmur, click, rub or gallop Lungs: diminished breath sounds LLL and RLL Wound: epigastric incision has dehisced. there is minimal drainage, some exudate in wound and yellow fat.   Lab Results: Recent Labs    10/14/19 1220 10/15/19 0738  WBC 12.5* 7.7  HGB 11.1* 9.8*  HCT 33.8* 31.1*  PLT 375 324   BMET:  Recent Labs    10/13/19 0500 10/14/19 1220  NA 137 140  K 3.7 3.5  CL 101 105  CO2 22 24  GLUCOSE 150* 159*  BUN 7 6  CREATININE 0.62 0.72  CALCIUM 8.9 8.9    PT/INR:  Recent Labs    10/15/19 0738  LABPROT 16.8*  INR 1.4*   ABG    Component Value Date/Time   PHART 7.338 (L) 10/02/2019 2017   HCO3 21.9 10/02/2019 2017   TCO2 23 10/02/2019 2017   ACIDBASEDEF 4.0 (H) 10/02/2019 2017   O2SAT 100.0 10/02/2019 2017   CBG (last 3)  No results for input(s): GLUCAP in the last 72 hours.  Assessment/Plan: S/P Procedure(s) (LRB): PERCUTANEOUS  SVC THROMBECTOMY USING INARI (N/A) Central VENOGRAM (N/A) Intravascular Ultrasound (N/A) Removal Port-A-Cath Insertion  Central Line Adult Ultrasound Guidance For Vascular Access Pericardial Window (N/A)  She has dehisced the epigastric incision and I think the best course of action is to debride this in the operating wound and apply a wound vac, otherwise it may open up further and have further devitalized fat. I discussed this with the patient and she is in agreement. I will plan to do tomorrow afternoon which is the first available time to do it. She will need to be NPO after MN but can have meds with sip of water.  I reviewed her CTA and echo. CTA shows no significant pericardial effusion, moderate to large right pleural effusion, small left effusion. 2D echo today shows normal LV systolic function, normal RV function, no pericardial effusion.   LOS: 17 days    Gaye Pollack 10/15/2019

## 2019-10-15 NOTE — Telephone Encounter (Signed)
Scheduled appt per 1/3 sch message - pt is aware of appt date and time   

## 2019-10-15 NOTE — Progress Notes (Signed)
Nutrition Follow-up  RD working remotely.  DOCUMENTATION CODES:   Obesity unspecified  INTERVENTION:   - ContinueEnsure Enlive poTID, each supplement provides 350 kcal and 20 grams of protein(vanilla flavor)  - Continue Pro-stat 30 ml po BID, each supplement provides 100 kcal and 15 grams of protein  - Continue Magic Cup BID with meals, each supplement provides 290 kcal and 9 grams of protein  - Encourage adequate PO intake  NUTRITION DIAGNOSIS:   Increased nutrient needs related to chronic illness, acute illness (breast cancer undergoing chemotherapy, COVID-19) as evidenced by estimated needs.  Ongoing, being addressed via supplements  GOAL:   Patient will meet greater than or equal to 90% of their needs  Progressing  MONITOR:   PO intake, Supplement acceptance, Labs, Weight trends, I & O's  REASON FOR ASSESSMENT:   Malnutrition Screening Tool    ASSESSMENT:   49 year old female who presented to the ED on 12/18 for further management of newly diagnosed PE and occlusion of SVC. PMH of breast cancer currently undergoing chemotherapy. Pt also found to be COVID-19 positive.  12/22 - s/p SVC mechanical thrombectomy, s/p subxiphoid pericardial window for drainage of hemopericardium 12/23 - extubated 12/27 - pericardial drain removed  Follow-up ECHO today with no evidence of pericardial effusion. PCCM consulted for diagnostic and therapeutic thoracentesis of bilateral pleural effusions.  Pt accepting most Ensure Enlive and Pro-stat supplements per [facility] documentation.  Per RN edema assessment, pt with mild pitting generalized edema and mild pitting edema to BUE and BLE.  Unable to reach pt via phone call to room.  Meal Completion: 75-100% x last 7 meals  Medications reviewed and include: Pepcid, Ensure Enlive TID, Pro-stat BID, Lasix, warfarin  Labs reviewed: phosphorus 4.9, hemoglobin 9.8  UOP: 2800 ml x 24 hours  NUTRITION - FOCUSED PHYSICAL  EXAM:  Unable to complete at this time. RD working remotely.  Diet Order:   Diet Order            Diet regular Room service appropriate? Yes; Fluid consistency: Thin  Diet effective now              EDUCATION NEEDS:   Education needs have been addressed  Skin:  Skin Assessment: Skin Integrity Issues: Incisions: right groin, chest  Last BM:  10/14/19 large type 4  Height:   Ht Readings from Last 1 Encounters:  09/28/19 5\' 1"  (1.549 m)    Weight:   Wt Readings from Last 1 Encounters:  10/13/19 102.2 kg    Ideal Body Weight:  47.7 kg  BMI:  Body mass index is 42.57 kg/m.  Estimated Nutritional Needs:   Kcal:  2000-2200  Protein:  100-115 grams  Fluid:  >/= 2.0 L    Gaynell Face, MS, RD, LDN Inpatient Clinical Dietitian Pager: [phone] Weekend/After Hours: [phone]

## 2019-10-15 NOTE — Progress Notes (Signed)
[place] for Warfarin, heparin  Indication: pulmonary embolus   Patient Measurements: Height: 5\' 1"  (154.9 cm) Weight: 225 lb 5 oz (102.2 kg) IBW/kg (Calculated) : 47.8 Heparin Dosing Weight: 71 kg  Vital Signs: Temp: 97.9 F (36.6 C) (01/04 1628) Temp Source: Oral (01/04 1628) BP: 133/80 (01/04 1628) Pulse Rate: 102 (01/04 1628)  Labs: Recent Labs    10/13/19 0500 10/14/19 1220 10/14/19 2202 10/15/19 0738  HGB 9.8* 11.1*  --  9.8*  HCT 30.1* 33.8*  --  31.1*  PLT 296 375  --  324  LABPROT 15.8* 16.2*  --  16.8*  INR 1.3* 1.3*  --  1.4*  HEPARINUNFRC  --   --  0.25* 0.38  CREATININE 0.62 0.72  --   --     Assessment: Pharmacy consulted to dose/monitor heparin in this 49 year old female with PMH significant for breast cancer on chemotherapy with curative intent. CT angio on 12/18 revealed acute bilateral PE, acute occlusive SVC thrombosis and right lower extremity DVT.  Found to have bilateral DVTs in right and left upper extremities. Pharmacy dosing heparin and warfarin.  Heparin level earlier today on heparin infusion rate of 1400 units/hr was 0.38 units/ml, which is within the goal range for this pt, INR 1.4, H/H 9.8/31.1, platelets 324.  Pharmacy rec'd call this AM from primary MD re: holding heparin for planned procedure today (thoracentesis); heparin turned off ~0900. Per Dr. Rodena Piety, pt has been cleared to restart heparin now (~5 PM) following thoracentesis today. Pt is scheduled for OR tomorrow (1/5) for debridement of pericardial window site and placement of wound vac. Dr. Rodena Piety gave order to hold warfarin tonight, due to planned procedure tomorrow.  Per RN, no bleeding observed post procedure this afternoon..   Goal of Therapy:  Heparin level = 0.3-0.5 (per MD request) INR 2-2.4 per MD request Monitor platelets by anticoagulation protocol: Yes   Plan:   Restart heparin infusion at 1400 units/hr Check 6-hr heparin  level Hold warfarin tonight (OR scheduled for tomorrow) Monitor daily heparin level, INR, CBC  Gillermina Hu, PharmD, BCPS, [facility] Clinical Pharmacist 10/15/19, 17:10 PM

## 2019-10-15 NOTE — Progress Notes (Signed)
[place] for Warfarin, heparin  Indication: pulmonary embolus   Patient Measurements: Height: 5\' 1"  (154.9 cm) Weight: 225 lb 5 oz (102.2 kg) IBW/kg (Calculated) : 47.8 Heparin Dosing Weight: 71 kg  Vital Signs: Temp: 98 F (36.7 C) (01/04 0541) Temp Source: Oral (01/04 0541) Pulse Rate: 92 (01/04 0541)  Labs: Recent Labs    10/13/19 0500 10/14/19 1220 10/14/19 2202 10/15/19 0738  HGB 9.8* 11.1*  --  9.8*  HCT 30.1* 33.8*  --  31.1*  PLT 296 375  --  324  LABPROT 15.8* 16.2*  --  16.8*  INR 1.3* 1.3*  --  1.4*  HEPARINUNFRC  --   --  0.25* 0.38  CREATININE 0.62 0.72  --   --       Assessment: Pharmacy consulted to dose/monitor heparin in this 49 year old female with PMH significant for breast cancer on chemotherapy with curative intent. CT angio on 12/18 revealed acute bilateral PE, acute occlusive SVC thrombosis and right lower extremity DVT.  Found to have bilateral DVTs in right and left upper extremities. Pharmacy dosing heparin and Warfarin. -heparin level= 0.38, INR 1.4  Received call from primary MD re: holding heparin for planned procedure. Heparin turned off ~0900. Once cleared post procedure for heparin restart, will likely restart at current rate and give warfarin 7.5mg  PO tonight.   Goal of Therapy:  Heparin level = 0.3-0.5 (per MD request) INR 2-2.4 per MD request Monitor platelets by anticoagulation protocol: Yes   Plan:   - hold heparin for now due to procedure - once OK to restart heparin, will restart at 1400 units/hr - Heparin level and INR daily - Warfarin 7.5mg  PO x 1 once cleared to restart anticoagulation after procedure.   Shenise Wolgamott A. Levada Dy, PharmD, BCPS, FNKF Clinical Pharmacist [place] Please utilize Amion for appropriate phone number to reach the unit pharmacist ([place])

## 2019-10-15 NOTE — Procedures (Signed)
Thoracentesis Procedure Note  Pre-operative Diagnosis: clinically significant pleural effusion   Post-operative Diagnosis: same  Indications: therapeutic and diagnostic   Procedure Details  Consent: Informed consent was obtained. Risks of the procedure were discussed including: infection, bleeding, pain, pneumothorax.  Site was identified with ultrasound assistance and marked. Under sterile conditions the patient was positioned. Betadine solution and sterile drapes were utilized.  1% buffered lidocaine was used to anesthetize the 6th rib space. Fluid was obtained without any difficulties and minimal blood loss.  A dressing was applied to the wound and wound care instructions were provided.   Findings 660 ml of amber pleural fluid was obtained. A sample was sent to Pathology for cytogenetics, flow, and cell counts, as well as for infection analysis.  Complications:  Cough that subsided, otherwise patient tolerated the procedure well.          Condition: stable  Plan A follow up chest x-ray was ordered.   Dr Tamala Julian was present for duration of procedure.

## 2019-10-15 NOTE — Progress Notes (Addendum)
PROGRESS NOTE    Veronica [name]  [id number] DOB: 1970/11/02 DOA: 09/28/2019 PCP: Blair Heys, PA-C   Brief Narrative: 49 yo female admitted with PE and SVC syndrome with hx of breast cancer. Had thrombectomy on [id number] complicated by hemopericardium and tamponade requiring subxyphoid pericardial window. Incidentally found to be positive for COVID 19  Assessment & Plan:   Principal Problem:   Multiple subsegmental pulmonary emboli without acute cor pulmonale (HCC) Active Problems:   Malignant neoplasm of upper-outer quadrant of right breast in female, estrogen receptor positive (HCC)   Thrombocytopenia (HCC)   Superior vena cava thrombosis (HCC)   SVCO (superior vena cava obstruction)   SVC syndrome   Sinus tachycardia   Essential hypertension   Gastroesophageal reflux disease   1 acute PE with acute SVC syndrome status post mechanicalthrombectomy complicated by hemopericardium with tamponadestatus post xiphoid window./Right lower extremity DVT Pericardial drain removed by cardiothoracic surgery on 10/07/2019. Patient being followed by vascular and cardiothoracic surgery. Follow-up echo 10/15/2019 with no evidence of pericardial effusion.  Ejection fraction 60 to 65%.  Received Keflex for cellulitis at the incision site last dose was October 14, 2019.  Patient with complaints of increasing swelling in the right upper extremity with tightening in the right forearm.  Dr. Grandville Silos discussed with cardiothoracic surgery-who recommended to get a right upper extremity ultrasound and CT angiogram of the chest. Right upper extremity Doppler-acute DVT involving the right internal jugular right subclavian right axillary and right brachial veins and radial veins and ulnar veins.  She also has acute superficial vein thrombosis.  Left upper extremity findings consistent with acute DVT involving the left axillary and left brachial veins and acute superficial vein thrombosis of the left cephalic  vein.  CT angiogram of the chest shows-acute bilateral PE.  CT evidence of right heart strain.  Persistent but improved thrombus within the SVC.  Growing bilateral pleural effusion moderate to large on the right and small to moderate on the left.  Complete collapse of the right lower lobe.  2. Sinus tachycardia/history of hypertension-blood pressure 110/72 with a heart rate of 88.  Continue Lopressor.  Continue to hold Benicar.    3. Atelectasis, bilateral pleural effusions/bilateral lower extremity edema/right lower extremity DVT-CT evidence of bilateral pleural effusion right more than left.    PCCM consulted for diagnostic and therapeutic thoracentesis.  Heparin on hold for the procedure.  4. COVID-19 infection with initial concern for COVID-19pneumonia Status post full course of remdesivir.Patient with sats 95% on 2 L.  When I saw her she was on room air.   5. Chemotherapy-induced pancytopenia/A ER positive breastcancer Pancytopenia improved. Hemoglobin currently at 9.8, platelet count at 324.Marland Kitchen Patient denies any overt bleeding. Per oncology transfuse for hemoglobin <8 and platelet count <20. Oncology following and appreciate input and recommendations. Outpatient follow-up with oncology.  6. History of gastroesophageal reflux disease Pepcid.   7 nonhealing subxiphoid area at the site of pericardial window-CT surgery planning to take her to the OR tomorrow for debridement and wound VAC.  N.p.o. after midnight.       Nutrition Problem: Increased nutrient needs Etiology: chronic illness, acute illness(breast cancer undergoing chemotherapy, COVID-19)     Signs/Symptoms: estimated needs    Interventions: Ensure Enlive (each supplement provides 350kcal and 20 grams of protein), Magic cup, Prostat  Estimated body mass index is 42.57 kg/m as calculated from the following:   Height as of this encounter: 5\' 1"  (1.549 m).   Weight as of this encounter: 102.2  kg. DVT prophylaxis:Coumadin/heparin Code Status:Full Family Communication:Updated patient. No family at bedside. Disposition Plan:Home when clinically improved, stable, INR therapeutic and when cleared by cardiothoracic surgery.   Consultants: Vascular, TCTS, Oncology, PCCM  Procedures: CTA chest 12/18 > bilateral lower lobe PE, SVC occlusion, probable RLL infarct, small b/l effusions. CT head 12/18 > no acute findings. Echo 12/20 > EF 60-65%, small pericardial effusion. Echo 12/22 (post thrombectomy) >significantly reduced clot burden in the SVC with improved flow on the venogram. Large expanding circumferential pericardial effusion with evidence of tamponade. Echo 12/22 (post pericardial window) > resolution of pericardial effusion without evidence of tamponade, LVEF > 65%, small clot burden in SVC. ETT 12/22 >12/23 L radial art line 12/22 > Right femoral CVL >> 12/25 2D echo 10/08/2019 Bilateral upper extremity Dopplers pending.  Significant Hospital Events   12/18 admit. 12/22 to OR for thrombectomy which was complicated bylarge hemopericardium with tamponade requiring subxyphoid pericardial window. 12/23 extubated 12/25 add lopressor for tachycardia 12/27 pericardial drain removed.   Antimicrobials:  Remdesivir 12/21 >> 12/25 Keflex 10/07/2019>>> 10/14/2019    Subjective: She is resting in bed complains of right upper extremity swelling more than the left, she is on room air she complains of dyspnea on exertion she denies any cough Objective: Vitals:   10/14/19 0537 10/14/19 1408 10/14/19 2030 10/15/19 0541  BP: (!) 122/106 119/74 110/72   Pulse:  74 100 92  Resp: 19 18 18    Temp: 98.5 F (36.9 C) 97.8 F (36.6 C) 98.3 F (36.8 C) 98 F (36.7 C)  TempSrc: Oral Oral Axillary Oral  SpO2:  95%    Weight:      Height:        Intake/Output Summary (Last 24 hours) at 10/15/2019 1233 Last data filed at 10/15/2019 1045 Gross per 24 hour  Intake  1089.36 ml  Output 2600 ml  Net -1510.64 ml   Filed Weights   09/28/19 1659 10/12/19 0500 10/13/19 0500  Weight: 95.7 kg 104.6 kg 102.2 kg    Examination:  General exam: Appears calm and comfortable  Respiratory system: Diminished breath sounds at the bases to auscultation. Respiratory effort normal. Cardiovascular system: S1 & S2 heard, RRR. No JVD, murmurs, rubs, gallops or clicks. No pedal edema. Gastrointestinal system: Abdomen is nondistended, soft and nontender. No organomegaly or masses felt. Normal bowel sounds heard. Central nervous system: Alert and oriented. No focal neurological deficits. Extremities: Bilateral upper extremity swelling right more than left trace bilateral lower extremity edema Skin: No rashes, lesions or ulcers Psychiatry: Judgement and insight appear normal. Mood & affect appropriate.     Data Reviewed: I have personally reviewed following labs and imaging studies  CBC: Recent Labs  Lab 10/10/19 0311 10/11/19 0500 10/12/19 0353 10/13/19 0500 10/14/19 1220 10/15/19 0738  WBC 11.4* 11.9* 11.0* 10.9* 12.5* 7.7  NEUTROABS 7.7 10.2* 7.1 7.4 10.9*  --   HGB 10.2* 9.9* 9.9* 9.8* 11.1* 9.8*  HCT 30.7* 30.3* 30.5* 30.1* 33.8* 31.1*  MCV 89.8 90.7 91.6 91.2 91.4 93.1  PLT 251 300 283 296 375 0000000   Basic Metabolic Panel: Recent Labs  Lab 10/10/19 0311 10/11/19 0500 10/12/19 0353 10/13/19 0500 10/14/19 1220 10/15/19 0738  NA 137 137 137 137 140  --   K 4.1 4.0 3.8 3.7 3.5  --   CL 102 103 102 101 105  --   CO2 20* 23 23 22 24   --   GLUCOSE 167* 167* 149* 150* 159*  --   BUN 8 9  6 7 6   --   CREATININE 0.72 0.85 0.54 0.62 0.72  --   CALCIUM 9.2 9.2 8.9 8.9 8.9  --   MG 2.0 2.0 1.9 2.0 2.0  --   PHOS 5.5* 5.4* 4.7* 4.9* 4.1 4.9*   GFR: Estimated Creatinine Clearance: 96.5 mL/min (by C-G formula based on SCr of 0.72 mg/dL). Liver Function Tests: Recent Labs  Lab 10/10/19 0311 10/11/19 0500 10/12/19 0353 10/13/19 0500 10/14/19 1220    AST 32 27 26 23 27   ALT 50* 46* 39 34 32  ALKPHOS 139* 133* 116 103 98  BILITOT 0.8 1.0 1.2 1.1 1.1  PROT 5.5* 5.7* 5.3* 5.1* 5.8*  ALBUMIN 2.7* 2.7* 2.8* 2.6* 2.8*   No results for input(s): LIPASE, AMYLASE in the last 168 hours. No results for input(s): AMMONIA in the last 168 hours. Coagulation Profile: Recent Labs  Lab 10/11/19 0707 10/12/19 0353 10/13/19 0500 10/14/19 1220 10/15/19 0738  INR 1.1 1.2 1.3* 1.3* 1.4*   Cardiac Enzymes: No results for input(s): CKTOTAL, CKMB, CKMBINDEX, TROPONINI in the last 168 hours. BNP (last 3 results) No results for input(s): PROBNP in the last 8760 hours. HbA1C: No results for input(s): HGBA1C in the last 72 hours. CBG: No results for input(s): GLUCAP in the last 168 hours. Lipid Profile: No results for input(s): CHOL, HDL, LDLCALC, TRIG, CHOLHDL, LDLDIRECT in the last 72 hours. Thyroid Function Tests: No results for input(s): TSH, T4TOTAL, FREET4, T3FREE, THYROIDAB in the last 72 hours. Anemia Panel: Recent Labs    10/14/19 1220 10/15/19 0738  FERRITIN 760* 706*   Sepsis Labs: No results for input(s): PROCALCITON, LATICACIDVEN in the last 168 hours.  No results found for this or any previous visit (from the past 240 hour(s)).       Radiology Studies: CT ANGIO CHEST PE W OR WO CONTRAST  Addendum Date: 10/13/2019   ADDENDUM REPORT: 10/13/2019 23:36 ADDENDUM: These results were called by telephone at the time of interpretation on 10/13/2019 at 11:36 pm to provider NP Kennon Holter, who verbally acknowledged these results. Electronically Signed   By: Constance Holster M.D.   On: 10/13/2019 23:36   Result Date: 10/13/2019 CLINICAL DATA:  Concern for PE. Concern SVC syndrome with history of breast cancer in known DVT. EXAM: CT ANGIOGRAPHY CHEST WITH CONTRAST TECHNIQUE: Multidetector CT imaging of the chest was performed using the standard protocol during bolus administration of intravenous contrast. Multiplanar CT image reconstructions  and MIPs were obtained to evaluate the vascular anatomy. CONTRAST:  60mL OMNIPAQUE IOHEXOL 350 MG/ML SOLN COMPARISON:  September 28, 2019 FINDINGS: Cardiovascular: Contrast injection is sufficient to demonstrate satisfactory opacification of the pulmonary arteries to the segmental level.There are persistent acute segmental pulmonary emboli involving the left lower lobe. There are scattered bilateral subsegmental and segmental pulmonary emboli involving the bilateral upper lobes. There appears to be lobar thrombus within the right upper lobe pulmonary artery. There is evidence for right-sided heart strain with an RV LV ratio measuring approximately 1.1. The heart size is normal. Again noted is thrombus within the SVC, improved from prior study. The right brachiocephalic vein is not well evaluated secondary to contrast timing. There may be superior extension of thrombus into this vein. Aortic calcifications are noted. There are postsurgical changes of the mediastinum likely related to open thrombectomy. Mediastinum/Nodes: --there are few mildly enlarged mediastinal lymph nodes. --No axillary lymphadenopathy. --No supraclavicular lymphadenopathy. --Normal thyroid gland. --The esophagus is unremarkable Lungs/Pleura: There is a moderate to large right-sided pleural effusion, increased from prior  study. There is a small to moderate-sized left-sided pleural effusion, increased from prior study. There is complete collapse of the right lower lobe. There is atelectasis at the left lung base. There is no pneumothorax. Upper Abdomen: No acute abnormality. Musculoskeletal: There is extensive overlying skin thickening involving the right breast. There is a right breast mass measuring approximately 8.1 by 4.8 cm and approximately 0 Hounsfield units. There is no acute displaced fracture. Review of the MIP images confirms the above findings. IMPRESSION: 1. Acute bilateral pulmonary emboli are again noted as detailed above. There is  CT evidence for right-sided heart strain with an RV/LV ratio measuring approximately 1.1. 2. Persistent but improved thrombus within the SVC. 3. Growing bilateral pleural effusions now moderate to large on the right and small to moderate-sized on the left. 4. Complete collapse of the right lower lobe. 5. Low-attenuation right-sided breast mass as detailed above. This could represent a postoperative seroma or hematoma in the appropriate clinical setting. An abscess is not entirely excluded. There is significant asymmetric skin thickening overlying the right breast likely related to the patient's reported history of breast cancer. Electronically Signed: By: Constance Holster M.D. On: 10/13/2019 23:24   VAS [place] UPPER EXTREMITY VENOUS DUPLEX  Result Date: 10/13/2019 UPPER VENOUS STUDY  Indications: Swelling, and SVC syndrome Risk Factors: Cancer. Comparison Study: No prior studies. Performing Technologist: Oliver Hum RVT  Examination Guidelines: A complete evaluation includes B-mode imaging, spectral Doppler, color Doppler, and power Doppler as needed of all accessible portions of each vessel. Bilateral testing is considered an integral part of a complete examination. Limited examinations for reoccurring indications may be performed as noted.  Right Findings: +----------+------------+---------+-----------+----------+-------+ RIGHT     CompressiblePhasicitySpontaneousPropertiesSummary +----------+------------+---------+-----------+----------+-------+ IJV           None       No        No                Acute  +----------+------------+---------+-----------+----------+-------+ Subclavian    None       No        No                Acute  +----------+------------+---------+-----------+----------+-------+ Axillary      None       No        No                Acute  +----------+------------+---------+-----------+----------+-------+ Brachial    Partial      No        No                 Acute  +----------+------------+---------+-----------+----------+-------+ Radial      Partial                                  Acute  +----------+------------+---------+-----------+----------+-------+ Ulnar       Partial                                  Acute  +----------+------------+---------+-----------+----------+-------+ Cephalic      Full                                          +----------+------------+---------+-----------+----------+-------+ Basilic       None  Acute  +----------+------------+---------+-----------+----------+-------+  Left Findings: +----------+------------+---------+-----------+----------+-------+ LEFT      CompressiblePhasicitySpontaneousPropertiesSummary +----------+------------+---------+-----------+----------+-------+ IJV           Full       Yes       Yes                      +----------+------------+---------+-----------+----------+-------+ Subclavian    Full       Yes       Yes                      +----------+------------+---------+-----------+----------+-------+ Axillary      None       No        No                Acute  +----------+------------+---------+-----------+----------+-------+ Brachial    Partial      No        No                Acute  +----------+------------+---------+-----------+----------+-------+ Radial        Full                                          +----------+------------+---------+-----------+----------+-------+ Ulnar         Full                                          +----------+------------+---------+-----------+----------+-------+ Cephalic      None                                   Acute  +----------+------------+---------+-----------+----------+-------+ Basilic       Full                                          +----------+------------+---------+-----------+----------+-------+  Summary:  Right: Findings consistent with acute  deep vein thrombosis involving the right internal jugular vein, right subclavian vein, right axillary vein, right brachial veins, right radial veins and right ulnar veins. Findings consistent with acute superficial vein thrombosis involving the right basilic vein.  Left: Findings consistent with acute deep vein thrombosis involving the left axillary vein and left brachial veins. Findings consistent with acute superficial vein thrombosis involving the left cephalic vein.  *See table(s) above for measurements and observations.  Diagnosing physician: Deitra Mayo MD Electronically signed by Deitra Mayo MD on 10/13/2019 at 4:31:10 PM.    Final    ECHOCARDIOGRAM LIMITED  Result Date: 10/15/2019   ECHOCARDIOGRAM LIMITED REPORT   Patient Name:   Veronica [name] Date of Exam: 10/15/2019 Medical Rec #:  [id number]      Height:       61.0 in Accession #:    [id number]     Weight:       225.3 lb Date of Birth:  1970-11-02      BSA:          1.99 m Patient Age:    20 years       BP:           82/69 mmHg Patient Gender: F  HR:           101 bpm. Exam Location:  Inpatient  Procedure: Limited Echo, Cardiac Doppler and Limited Color Doppler Indications:    I31.3 Pericardial effusion  History:        Patient has prior history of Echocardiogram examinations, most                 recent 10/08/2019. COVID-19 Positive. Cancer.  Sonographer:    Jonelle Sidle Dance Referring Phys: [place]  1. Left ventricular ejection fraction, by visual estimation, is 60 to 65%. The left ventricle has normal function. There is no increased left ventricular wall thickness.  2. The left ventricle has no regional wall motion abnormalities.  3. Global right ventricle has normal systolc function.The right ventricular size is normal. no increase in right ventricular wall thickness.  4. The mitral valve is normal in structure. No evidence of mitral valve regurgitation.  5. The tricuspid valve was normal in structure.  Tricuspid valve regurgitation is mild.  6. Tricuspid valve regurgitation is mild.  7. The pulmonic valve was normal in structure.  8. Mildly elevated pulmonary artery systolic pressure.  9. The atrial septum is grossly normal. FINDINGS  Left Ventricle: Left ventricular ejection fraction, by visual estimation, is 60 to 65%. The left ventricle has normal function. The left ventricle has no regional wall motion abnormalities. There is no increased left ventricular wall thickness. Right Ventricle: The right ventricular size is normal. No increase in right ventricular wall thickness. Global RV systolic function is has normal systolic function. The tricuspid regurgitant velocity is 2.62 m/s, and with an assumed right atrial pressure  of 8 mmHg, the estimated right ventricular systolic pressure is mildly elevated at 35.5 mmHg. Left Atrium: Left atrial size was normal in size. Right Atrium: Right atrial size was normal in size. Right atrial pressure is estimated at 8 mmHg. Pericardium: There is no evidence of pericardial effusion is seen. There is no evidence of pericardial effusion. Mitral Valve: The mitral valve is normal in structure. MV Area by PHT, 4.15 cm. MV PHT, 53.07 msec. No evidence of mitral valve regurgitation. Tricuspid Valve: The tricuspid valve is normal in structure. Tricuspid valve regurgitation is mild. Aortic Valve: The aortic valve is normal in structure. Aortic valve regurgitation is not visualized. Pulmonic Valve: The pulmonic valve was normal in structure. Pulmonic valve regurgitation is not visualized by color flow Doppler. Pulmonic regurgitation is not visualized by color flow Doppler. Aorta: The aortic root and ascending aorta are structurally normal, with no evidence of dilitation. Shunts: The atrial septum is grossly normal.  LEFT VENTRICLE          Normals PLAX 2D LVIDd:         3.70 cm  3.6 cm LVIDs:         2.52 cm  1.7 cm LV PW:         0.77 cm  1.4 cm LV IVS:        0.80 cm  1.3 cm LVOT  diam:     1.90 cm  2.0 cm LV SV:         35 ml    79 ml LV SV Index:   16.39    45 ml/m2 LVOT Area:     2.84 cm 3.14 cm2  IVC IVC diam: 1.92 cm LEFT ATRIUM             Index LA diam:        3.70 cm  1.86 cm/m LA Vol (A2C):   39.6 ml 19.93 ml/m LA Vol (A4C):   36.9 ml 18.57 ml/m LA Biplane Vol: 40.2 ml 20.23 ml/m  AORTIC VALVE             Normals LVOT Vmax:   122.00 cm/s LVOT Vmean:  70.700 cm/s 75 cm/s LVOT VTI:    0.206 m     25.3 cm  AORTA                 Normals Ao Root diam: 2.60 cm 31 mm Ao Asc diam:  2.40 cm 31 mm MITRAL VALVE              Normals   TRICUSPID VALVE             Normals MV Area (PHT): 4.15 cm             TR Peak grad:   27.5 mmHg MV PHT:        53.07 msec 55 ms     TR Vmax:        262.00 cm/s 288 cm/s MV Decel Time: 183 msec   187 ms MV E velocity: 82.50 cm/s 103 cm/s  SHUNTS MV A velocity: 68.90 cm/s 70.3 cm/s Systemic VTI:  0.21 m MV E/A ratio:  1.20       1.5       Systemic Diam: 1.90 cm  Mertie Moores MD Electronically signed by Mertie Moores MD Signature Date/Time: 10/15/2019/11:13:54 AMThe mitral valve is normal in structure.    Final         Scheduled Meds: . Chlorhexidine Gluconate Cloth  6 each Topical Daily  . famotidine  10 mg Oral BID  . feeding supplement (ENSURE ENLIVE)  237 mL Oral TID BM  . feeding supplement (PRO-STAT SUGAR FREE 64)  30 mL Oral BID  . furosemide  10 mg Oral Daily  . mouth rinse  15 mL Mouth Rinse BID  . metoprolol tartrate  25 mg Oral BID  . sodium chloride flush  10-40 mL Intracatheter Q12H  . Warfarin - Pharmacist Dosing Inpatient   Does not apply q1800   Continuous Infusions: . sodium chloride Stopped (10/05/19 0829)     LOS: 17 days     Georgette Shell, MD [facility]   If 7PM-7AM, please contact night-coverage [email or web address] Password [facility] 10/15/2019, 12:33 PM

## 2019-10-15 NOTE — Progress Notes (Signed)
  Echocardiogram 2D Echocardiogram has been performed.  Veronica [name] 10/15/2019, 9:31 AM

## 2019-10-16 ENCOUNTER — Other Ambulatory Visit: Payer: 59

## 2019-10-16 ENCOUNTER — Inpatient Hospital Stay (HOSPITAL_COMMUNITY): Payer: 59 | Admitting: Anesthesiology

## 2019-10-16 ENCOUNTER — Other Ambulatory Visit: Payer: Self-pay

## 2019-10-16 ENCOUNTER — Ambulatory Visit: Payer: 59

## 2019-10-16 ENCOUNTER — Encounter (HOSPITAL_COMMUNITY)
Admission: EM | Disposition: A | Discharge status: Home / Self Care | Payer: Self-pay | Source: Home / Self Care | Attending: Internal Medicine

## 2019-10-16 ENCOUNTER — Ambulatory Visit: Payer: 59 | Admitting: Adult Health

## 2019-10-16 ENCOUNTER — Encounter: Payer: Self-pay | Admitting: *Deleted

## 2019-10-16 ENCOUNTER — Encounter (HOSPITAL_COMMUNITY): Payer: Self-pay | Admitting: Internal Medicine

## 2019-10-16 DIAGNOSIS — R2233 Localized swelling, mass and lump, upper limb, bilateral: Secondary | ICD-10-CM

## 2019-10-16 DIAGNOSIS — I9789 Other postprocedural complications and disorders of the circulatory system, not elsewhere classified: Secondary | ICD-10-CM

## 2019-10-16 HISTORY — PX: APPLICATION OF WOUND VAC: SHX5189

## 2019-10-16 HISTORY — PX: I & D EXTREMITY: SHX5045

## 2019-10-16 LAB — CBC
HCT: 31.8 % — ABNORMAL LOW (ref 36.0–46.0)
Hemoglobin: 10.3 g/dL — ABNORMAL LOW (ref 12.0–15.0)
MCH: 29.5 pg (ref 26.0–34.0)
MCHC: 32.4 g/dL (ref 30.0–36.0)
MCV: 91.1 fL (ref 80.0–100.0)
Platelets: 359 10*3/uL (ref 150–400)
RBC: 3.49 MIL/uL — ABNORMAL LOW (ref 3.87–5.11)
RDW: 15.6 % — ABNORMAL HIGH (ref 11.5–15.5)
WBC: 10.1 10*3/uL (ref 4.0–10.5)
nRBC: 0.2 % (ref 0.0–0.2)

## 2019-10-16 LAB — PROTIME-INR
INR: 1.5 — ABNORMAL HIGH (ref 0.8–1.2)
Prothrombin Time: 17.7 seconds — ABNORMAL HIGH (ref 11.4–15.2)

## 2019-10-16 LAB — HEPARIN LEVEL (UNFRACTIONATED)
Heparin Unfractionated: 0.23 IU/mL — ABNORMAL LOW (ref 0.30–0.70)
Heparin Unfractionated: 0.27 IU/mL — ABNORMAL LOW (ref 0.30–0.70)
Heparin Unfractionated: 0.36 IU/mL (ref 0.30–0.70)

## 2019-10-16 LAB — D-DIMER, QUANTITATIVE: D-Dimer, Quant: 5.98 ug/mL-FEU — ABNORMAL HIGH (ref 0.00–0.50)

## 2019-10-16 LAB — C-REACTIVE PROTEIN: CRP: 2.9 mg/dL — ABNORMAL HIGH (ref <1.0)

## 2019-10-16 LAB — FERRITIN: Ferritin: 669 ng/mL — ABNORMAL HIGH (ref 11–307)

## 2019-10-16 LAB — TRIGLYCERIDES, BODY FLUIDS: Triglycerides, Fluid: 36 mg/dL

## 2019-10-16 LAB — PHOSPHORUS: Phosphorus: 4 mg/dL (ref 2.5–4.6)

## 2019-10-16 SURGERY — IRRIGATION AND DEBRIDEMENT EXTREMITY
Anesthesia: General

## 2019-10-16 MED ORDER — FENTANYL CITRATE (PF) 100 MCG/2ML IJ SOLN
INTRAMUSCULAR | Status: DC | PRN
  Administered 2019-10-16: 50 ug via INTRAVENOUS

## 2019-10-16 MED ORDER — ONDANSETRON HCL 4 MG/2ML IJ SOLN
INTRAMUSCULAR | Status: DC | PRN
  Administered 2019-10-16: 4 mg via INTRAVENOUS

## 2019-10-16 MED ORDER — MIDAZOLAM HCL 2 MG/2ML IJ SOLN
INTRAMUSCULAR | Status: AC
  Filled 2019-10-16: qty 2

## 2019-10-16 MED ORDER — FENTANYL CITRATE (PF) 250 MCG/5ML IJ SOLN
INTRAMUSCULAR | Status: AC
  Filled 2019-10-16: qty 5

## 2019-10-16 MED ORDER — PROPOFOL 500 MG/50ML IV EMUL
INTRAVENOUS | Status: DC | PRN
  Administered 2019-10-16: 50 ug/kg/min via INTRAVENOUS

## 2019-10-16 MED ORDER — LACTATED RINGERS IV SOLN: INTRAVENOUS | Status: DC | PRN

## 2019-10-16 MED ORDER — KETAMINE HCL 100 MG/ML IJ SOLN
INTRAMUSCULAR | Status: AC
  Filled 2019-10-16: qty 1

## 2019-10-16 MED ORDER — WARFARIN SODIUM 7.5 MG PO TABS
7.5000 mg | ORAL_TABLET | Freq: Once | ORAL | Status: AC
  Administered 2019-10-16: 7.5 mg via ORAL
  Filled 2019-10-16: qty 1

## 2019-10-16 MED ORDER — 0.9 % SODIUM CHLORIDE (POUR BTL) OPTIME
TOPICAL | Status: DC | PRN
  Administered 2019-10-16: 1000 mL

## 2019-10-16 MED ORDER — KETAMINE HCL 100 MG/ML IJ SOLN
INTRAMUSCULAR | Status: DC | PRN
  Administered 2019-10-16: 20 mg via INTRAVENOUS
  Administered 2019-10-16 (×2): 30 mg via INTRAVENOUS

## 2019-10-16 MED ORDER — MIDAZOLAM HCL 5 MG/5ML IJ SOLN
INTRAMUSCULAR | Status: DC | PRN
  Administered 2019-10-16: 2 mg via INTRAVENOUS

## 2019-10-16 MED ORDER — PROPOFOL 10 MG/ML IV BOLUS
INTRAVENOUS | Status: AC
  Filled 2019-10-16: qty 20

## 2019-10-16 SURGICAL SUPPLY — 34 items
BLADE CLIPPER SURG (BLADE) IMPLANT
BNDG ELASTIC 4X5.8 VLCR STR LF (GAUZE/BANDAGES/DRESSINGS) IMPLANT
BNDG ELASTIC 6X5.8 VLCR STR LF (GAUZE/BANDAGES/DRESSINGS) IMPLANT
BNDG GAUZE ELAST 4 BULKY (GAUZE/BANDAGES/DRESSINGS) IMPLANT
CANISTER SUCT 3000ML PPV (MISCELLANEOUS) ×2 IMPLANT
CANISTER WOUNDNEG PRESSURE 500 (CANNISTER) ×2 IMPLANT
COVER SURGICAL LIGHT HANDLE (MISCELLANEOUS) ×2 IMPLANT
COVER WAND RF STERILE (DRAPES) IMPLANT
DRSG VAC ATS LRG SENSATRAC (GAUZE/BANDAGES/DRESSINGS) IMPLANT
DRSG VAC ATS MED SENSATRAC (GAUZE/BANDAGES/DRESSINGS) IMPLANT
DRSG VAC ATS SM SENSATRAC (GAUZE/BANDAGES/DRESSINGS) ×2 IMPLANT
ELECT REM PT RETURN 9FT ADLT (ELECTROSURGICAL) ×2
ELECTRODE REM PT RTRN 9FT ADLT (ELECTROSURGICAL) ×1 IMPLANT
GAUZE SPONGE 4X4 12PLY STRL (GAUZE/BANDAGES/DRESSINGS) IMPLANT
GLOVE BIOGEL PI IND STRL 6.5 (GLOVE) ×1 IMPLANT
GLOVE BIOGEL PI INDICATOR 6.5 (GLOVE) ×1
GLOVE EUDERMIC 7 POWDERFREE (GLOVE) ×2 IMPLANT
GOWN STRL REUS W/ TWL LRG LVL3 (GOWN DISPOSABLE) ×2 IMPLANT
GOWN STRL REUS W/ TWL XL LVL3 (GOWN DISPOSABLE) ×1 IMPLANT
GOWN STRL REUS W/TWL LRG LVL3 (GOWN DISPOSABLE) ×2
GOWN STRL REUS W/TWL XL LVL3 (GOWN DISPOSABLE) ×1
KIT BASIN OR (CUSTOM PROCEDURE TRAY) ×2 IMPLANT
KIT TURNOVER KIT B (KITS) ×2 IMPLANT
MARKER SKIN DUAL TIP RULER LAB (MISCELLANEOUS) ×2 IMPLANT
NS IRRIG 1000ML POUR BTL (IV SOLUTION) ×2 IMPLANT
PACK GENERAL/GYN (CUSTOM PROCEDURE TRAY) ×2 IMPLANT
PACK UNIVERSAL I (CUSTOM PROCEDURE TRAY) ×2 IMPLANT
PAD ARMBOARD 7.5X6 YLW CONV (MISCELLANEOUS) ×4 IMPLANT
PENCIL SMOKE EVACUATOR (MISCELLANEOUS) ×2 IMPLANT
SUT VIC AB 3-0 SH 27 (SUTURE)
SUT VIC AB 3-0 SH 27X BRD (SUTURE) IMPLANT
TOWEL GREEN STERILE (TOWEL DISPOSABLE) ×2 IMPLANT
TOWEL GREEN STERILE FF (TOWEL DISPOSABLE) ×2 IMPLANT
WATER STERILE IRR 1000ML POUR (IV SOLUTION) ×2 IMPLANT

## 2019-10-16 NOTE — Anesthesia Postprocedure Evaluation (Signed)
Anesthesia Post Note  Patient: Veronica [name]  Procedure(s) Performed: DEBRIDEMENT of DEHISCED MIDLINE SURGICAL INCISION (N/A ) APPLICATION OF WOUND VAC (N/A )     Patient location during evaluation: PACU Anesthesia Type: General Level of consciousness: awake and alert Pain management: pain level controlled Vital Signs Assessment: post-procedure vital signs reviewed and stable Respiratory status: spontaneous breathing, nonlabored ventilation, respiratory function stable and patient connected to nasal cannula oxygen Cardiovascular status: blood pressure returned to baseline and stable Postop Assessment: no apparent nausea or vomiting Anesthetic complications: no    Last Vitals:  Vitals:   10/16/19 0500 10/16/19 1302  BP: (!) 114/58 123/73  Pulse: 95 85  Resp:  16  Temp: 37.4 C 36.5 C  SpO2: 96% 96%    Last Pain:  Vitals:   10/16/19 1302  TempSrc: Oral  PainSc:                  Tiajuana Amass

## 2019-10-16 NOTE — Brief Op Note (Signed)
10/16/2019  4:25 PM  PATIENT:  Veronica [name]  49 y.o. female  PRE-OPERATIVE DIAGNOSIS:  WOUND INFECTION COVID POSITIVE PATIENT  POST-OPERATIVE DIAGNOSIS:  WOUND INFECTION COVID POSITIVE PATIENT  PROCEDURE:  Procedure(s): DEBRIDEMENT of DEHISCED MIDLINE SURGICAL INCISION (N/A) APPLICATION OF WOUND VAC (N/A)  SURGEON:  Surgeon(s) and Role:    * Vaishnav Demartin, Fernande Boyden, MD - Primary  PHYSICIAN ASSISTANT: none  ASSISTANTS: none  ANESTHESIA:   MAC  EBL:  none   BLOOD ADMINISTERED:none  DRAINS: none   LOCAL MEDICATIONS USED:  NONE  SPECIMEN:  No Specimen  DISPOSITION OF SPECIMEN:  N/A  COUNTS:  YES  TOURNIQUET:  * No tourniquets in log *  DICTATION: .Note written in EPIC  PLAN OF CARE: Admit to inpatient   PATIENT DISPOSITION:  PACU   Delay start of Pharmacological VTE agent (>24hrs) due to surgical blood loss or risk of bleeding: no

## 2019-10-16 NOTE — Progress Notes (Signed)
TCTS:  Her heparin can be restarted immediately postop and Coumadin can be continued.  Dr. Ezequiel Ganser [phone]

## 2019-10-16 NOTE — Progress Notes (Addendum)
[place] for Heparin  Indication: pulmonary embolus   Patient Measurements: Height: 5\' 1"  (154.9 cm) Weight: 225 lb 5 oz (102.2 kg) IBW/kg (Calculated) : 47.8 Heparin Dosing Weight: 71 kg  Vital Signs: Temp: 99 F (37.2 C) (01/04 2151) Temp Source: Oral (01/04 2151) BP: 141/81 (01/04 2151) Pulse Rate: 110 (01/04 2151)  Labs: Recent Labs    10/13/19 0500 10/14/19 1220 10/14/19 2202 10/15/19 0738 10/15/19 2351  HGB 9.8* 11.1*  --  9.8* 10.3*  HCT 30.1* 33.8*  --  31.1* 31.8*  PLT 296 375  --  324 359  LABPROT 15.8* 16.2*  --  16.8* 17.7*  INR 1.3* 1.3*  --  1.4* 1.5*  HEPARINUNFRC  --   --  0.25* 0.38 0.23*  CREATININE 0.62 0.72  --   --   --     Assessment: Pharmacy consulted to dose/monitor heparin in this 49 year old female with PMH significant for breast cancer on chemotherapy with curative intent. CT angio on 12/18 revealed acute bilateral PE, acute occlusive SVC thrombosis and right lower extremity DVT.  Found to have bilateral DVTs in right and left upper extremities. Pharmacy dosing heparin and warfarin.  Heparin level earlier today on heparin infusion rate of 1400 units/hr was 0.38 units/ml, which is within the goal range for this pt, INR 1.4, H/H 9.8/31.1, platelets 324.  Pharmacy rec'd call this AM from primary MD re: holding heparin for planned procedure today (thoracentesis); heparin turned off ~0900. Per Dr. Rodena Piety, pt has been cleared to restart heparin now (~5 PM) following thoracentesis today. Pt is scheduled for OR tomorrow (1/5) for debridement of pericardial window site and placement of wound vac. Dr. Rodena Piety gave order to hold warfarin tonight, due to planned procedure tomorrow.  Per RN, no bleeding observed post procedure this afternoon..   1/5 AM update:  Heparin level below goal Hgb stable  Goal of Therapy:  Heparin level = 0.3-0.5 (per MD request) INR 2-2.4 per MD request Monitor platelets by  anticoagulation protocol: Yes   Plan:   -Inc heparin to 1500 units/hr -Re-check heparin level at [place], PharmD, BCPS, [facility] Clinical Pharmacist 10/15/19, 17:10 PM  ADDENDUM:  Above note was written by Narda Bonds, PharmD, BCPS at 12:44 AM on 1/5

## 2019-10-16 NOTE — Progress Notes (Signed)
    Subjective  -   Neck swelling has resolved, still with swelling to both arms   Physical Exam:  Midline upper abd incision with fat necrosis Mild B UE edema without blistering or skin compromise Breathing non labored on RA       Assessment/Plan:    SVC now patent by CT scan.  She has persistent bilateral upper extremity DVT.  Her arm symptoms are tolerable.  I would recommend treating her with anticoagulation.  She would benefit from upper extremity compression garmets once she is discharged.  No plans for further intervention at this time  For upper abd debridement today with Dr. Isac Caddy Nani Ingram 10/16/2019 2:43 PM --  Vitals:   10/16/19 0500 10/16/19 1302  BP: (!) 114/58 123/73  Pulse: 95 85  Resp:  16  Temp: 99.3 F (37.4 C) 97.7 F (36.5 C)  SpO2: 96% 96%    Intake/Output Summary (Last 24 hours) at 10/16/2019 1443 Last data filed at 10/16/2019 1100 Gross per 24 hour  Intake 600 ml  Output 3450 ml  Net -2850 ml     Laboratory CBC    Component Value Date/Time   WBC 10.1 10/15/2019 2351   HGB 10.3 (L) 10/15/2019 2351   HGB 14.8 05/09/2019 1225   HCT 31.8 (L) 10/15/2019 2351   PLT 359 10/15/2019 2351   PLT 210 05/09/2019 1225    BMET    Component Value Date/Time   NA 140 10/14/2019 1220   K 3.5 10/14/2019 1220   CL 105 10/14/2019 1220   CO2 24 10/14/2019 1220   GLUCOSE 159 (H) 10/14/2019 1220   BUN 6 10/14/2019 1220   CREATININE 0.72 10/14/2019 1220   CREATININE 0.71 09/05/2019 1030   CALCIUM 8.9 10/14/2019 1220   GFRNONAA >60 10/14/2019 1220   GFRNONAA >60 09/05/2019 1030   GFRAA >60 10/14/2019 1220   GFRAA >60 09/05/2019 1030    COAG Lab Results  Component Value Date   INR 1.5 (H) 10/15/2019   INR 1.4 (H) 10/15/2019   INR 1.3 (H) 10/14/2019   No results found for: PTT  Antibiotics Anti-infectives (From admission, onward)   Start     Dose/Rate Route Frequency Ordered Stop   10/07/19 1400  cephALEXin (KEFLEX) capsule 500 mg      500 mg Oral Every 8 hours 10/07/19 1214 10/14/19 2359   10/02/19 1000  remdesivir 100 mg in sodium chloride 0.9 % 100 mL IVPB     100 mg 200 mL/hr over 30 Minutes Intravenous Daily 10/01/19 1121 10/05/19 1042   10/01/19 1200  ciprofloxacin (CIPRO) IVPB 400 mg  Status:  Discontinued     400 mg 200 mL/hr over 60 Minutes Intravenous Every 12 hours 10/01/19 1113 10/03/19 2154   10/01/19 1130  remdesivir 200 mg in sodium chloride 0.9% 250 mL IVPB     200 mg 580 mL/hr over 30 Minutes Intravenous Once 10/01/19 1121 10/01/19 1301       V. Leia Alf, M.D., [facility] [place] Office: [phone] Pager:  [phone]

## 2019-10-16 NOTE — Transfer of Care (Signed)
Immediate Anesthesia Transfer of Care Note  Patient: Veronica [name]  Procedure(s) Performed: DEBRIDEMENT of DEHISCED MIDLINE SURGICAL INCISION (N/A ) APPLICATION OF WOUND VAC (N/A )  Patient Location: Nursing Unit  Anesthesia Type:MAC  Level of Consciousness: awake, alert  and oriented  Airway & Oxygen Therapy: Patient Spontanous Breathing and Patient connected to face mask oxygen  Post-op Assessment: Report given to RN and Post -op Vital signs reviewed and stable  Post vital signs: Reviewed and stable  Last Vitals:  Vitals Value Taken Time  BP    Temp    Pulse    Resp    SpO2      Last Pain:  Vitals:   10/16/19 1302  TempSrc: Oral  PainSc:       Patients Stated Pain Goal: 0 (12/29/20 4825)  Complications: No apparent anesthesia complications

## 2019-10-16 NOTE — Progress Notes (Addendum)
[place] for Heparin  Indication: pulmonary embolus   Patient Measurements: Height: 5\' 1"  (154.9 cm) Weight: 225 lb 5 oz (102.2 kg) IBW/kg (Calculated) : 47.8 Heparin Dosing Weight: 71 kg  Vital Signs: Temp: 99.3 F (37.4 C) (01/05 0500) Temp Source: Oral (01/05 0500) BP: 114/58 (01/05 0500) Pulse Rate: 95 (01/05 0500)  Labs: Recent Labs    10/14/19 1220 10/15/19 0738 10/15/19 2351 10/16/19 0902  HGB 11.1* 9.8* 10.3*  --   HCT 33.8* 31.1* 31.8*  --   PLT 375 324 359  --   LABPROT 16.2* 16.8* 17.7*  --   INR 1.3* 1.4* 1.5*  --   HEPARINUNFRC  --  0.38 0.23* 0.36  CREATININE 0.72  --   --   --     Assessment: Pharmacy consulted to dose/monitor heparin in this 49 year old female with PMH significant for breast cancer on chemotherapy with curative intent. CT angio on 12/18 revealed acute bilateral PE, acute occlusive SVC thrombosis and right lower extremity DVT.  Found to have bilateral DVTs in right and left upper extremities. Pharmacy dosing heparin and warfarin.  Heparin level earlier today on heparin infusion rate of 1400 units/hr was 0.38 units/ml, which is within the goal range for this pt, INR 1.4, H/H 9.8/31.1, platelets 324.  Pharmacy rec'd call from primary MD re: holding heparin for planned procedure yesterday (thoracentesis); heparin turned off ~0900. Per Dr. Rodena Piety, pt was cleared to restart heparin at ~5 PM following thoracentesis. Pt is scheduled for OR today (1/5) for debridement of pericardial window site and placement of wound vac. Dr. Rodena Piety gave order to hold warfarin, due to planned procedure today  No bleeding noted.  Heparin level is 0.36 this AM. Will continue current rate and get confirmatory heparin level this afternoon depending on surgery schedule.   Goal of Therapy:  Heparin level = 0.3-0.5 (per MD request) INR 2-2.4 per MD request Monitor platelets by anticoagulation protocol: Yes   Plan:   -Continue  heparin at 1500 units/hr -Will f/u with RN regarding hold time for surgery today.  -Re-check heparin level once heparin restarted post surgery. Hold warfarin for now given surgery. F/u with MD re: restart post-op   Aaira Oestreicher A. Levada Dy, PharmD, BCPS, FNKF Clinical Pharmacist [place] Please utilize Amion for appropriate phone number to reach the unit pharmacist ([place])

## 2019-10-16 NOTE — Progress Notes (Signed)
PROGRESS NOTE    Veronica [name]  [id number] DOB: [birth year] DOA: 09/28/2019 PCP: Blair Heys, PA-C  Brief Narrative: 49 yo female admitted with PE and SVC syndrome with hx of breast cancer. Had thrombectomy on [id number] complicated by hemopericardium and tamponade requiring subxyphoid pericardial window. Incidentally found to be positive for COVID 19  Assessment & Plan:   Principal Problem:   Multiple subsegmental pulmonary emboli without acute cor pulmonale (HCC) Active Problems:   Malignant neoplasm of upper-outer quadrant of right breast in female, estrogen receptor positive (HCC)   Thrombocytopenia (HCC)   Superior vena cava thrombosis (HCC)   SVCO (superior vena cava obstruction)   SVC syndrome   Sinus tachycardia   Essential hypertension   Gastroesophageal reflux disease   Pleural effusion   1 acute PE with acute SVC syndrome status post mechanicalthrombectomy complicated by hemopericardium with tamponadestatus post xiphoid window./Right lower extremity DVT Pericardial drain removed by cardiothoracic surgery on 10/07/2019. Patient being followed by vascular and cardiothoracic surgery. Follow-up echo 10/15/2019 with no evidence of pericardial effusion.  Ejection fraction 60 to 65%.  Received Keflex for cellulitis at the incision site last dose was October 14, 2019.  Dr. Grandville Silos discussed with cardiothoracic surgery-who recommended to get a right upper extremity ultrasound and CT angiogram of the chest. Right upper extremity Doppler-acute DVT involving the right internal jugular right subclavian right axillary and right brachial veins and radial veins and ulnar veins. She also has acute superficial vein thrombosis. Left upper extremity findings consistent with acute DVT involving the left axillary and left brachial veins and acute superficial vein thrombosis of the left cephalic vein.  CT angiogram of the chest shows-acute bilateral PE. CT evidence of right heart strain.  Persistent but improved thrombus within the SVC. Growing bilateral pleural effusion moderate to large on the right and small to moderate on the left. Complete collapse of the right lower lobe.  Thoracentesis 10/15/2019 600 cc of fluid yielded by PCCM.  Appears to be a transudate however culture and cytology pending.  2. Sinus tachycardia/essential hypertension-blood pressure 123/73 with a heart rate of 85 continue Lopressor and hold Benicar.   3. Atelectasis, bilateral pleural effusions/bilateral lower extremity edema/right lower extremity DVT-CT evidence of bilateral pleural effusion right more than left.  Status post right thoracentesis 10/14/2018 1 repeat chest x-ray shows no pneumothorax and decreasing effusion.  4. COVID-19 infection with initial concern for COVID-19pneumonia Status post full course of remdesivir. She is saturating on room air 95%.   5. Chemotherapy-induced pancytopenia/A ER positive breastcancer-resolved  Hemoglobin 10.3 platelet count 359. Per oncology transfuse for hemoglobin <8 and platelet count <20. Oncology following and appreciate input and recommendations. Outpatient follow-up with oncology.  6. History of gastroesophageal reflux disease Pepcid.  7 nonhealing subxiphoid area at the site of pericardial window-CT surgery planning to take her to the OR today for debridement and wound VAC     Nutrition Problem: Increased nutrient needs Etiology: chronic illness, acute illness(breast cancer undergoing chemotherapy, COVID-19)     Signs/Symptoms: estimated needs    Interventions: Ensure Enlive (each supplement provides 350kcal and 20 grams of protein), Magic cup, Prostat  Estimated body mass index is 42.57 kg/m as calculated from the following:   Height as of this encounter: 5\' 1"  (1.549 m).   Weight as of this encounter: 102.2 kg.  DVT prophylaxis:Coumadin/heparin Code Status:Full Family Communication discussed with patient  who will discuss with her husband. Disposition Plan:Pending clinical improvement   Consultants: Vascular, TCTS, Oncology, PCCM  Procedures: CTA  chest 12/18 > bilateral lower lobe PE, SVC occlusion, probable RLL infarct, small b/l effusions. CT head 12/18 > no acute findings. Echo 12/20 > EF 60-65%, small pericardial effusion. Echo 12/22 (post thrombectomy) >significantly reduced clot burden in the SVC with improved flow on the venogram. Large expanding circumferential pericardial effusion with evidence of tamponade. Echo 12/22 (post pericardial window) > resolution of pericardial effusion without evidence of tamponade, LVEF > 65%, small clot burden in SVC. ETT 12/22 >12/23 L radial art line 12/22 > Right femoral CVL >> 12/25 2D echo 10/08/2019 Upper extremity Dopplers positive for acute DVT  Significant Hospital Events   12/18 admit. 12/22 to OR for thrombectomy which was complicated bylarge hemopericardium with tamponade requiring subxyphoid pericardial window. 12/23 extubated 12/25 add lopressor for tachycardia 12/27 pericardial drain removed. 10/15/2019 right thoracentesis 600 cc fluid taken out 10/16/2019 debridement of the xiphoid wound and wound VAC to be done Antimicrobials:  Remdesivir 12/21 >> 12/25 Keflex 10/07/2019>>> 10/14/2019   Subjective: Anxious to have procedure done  Breathing better after thoracentesis  Objective: Vitals:   10/15/19 1628 10/15/19 2151 10/16/19 0500 10/16/19 1302  BP: 133/80 (!) 141/81 (!) 114/58 123/73  Pulse: (!) 102 (!) 110 95 85  Resp: 18   16  Temp: 97.9 F (36.6 C) 99 F (37.2 C) 99.3 F (37.4 C) 97.7 F (36.5 C)  TempSrc: Oral Oral Oral Oral  SpO2: 94% 95% 96% 96%  Weight:      Height:        Intake/Output Summary (Last 24 hours) at 10/16/2019 1407 Last data filed at 10/16/2019 1100 Gross per 24 hour  Intake 600 ml  Output 3450 ml  Net -2850 ml   Filed Weights   09/28/19 1659 10/12/19 0500 10/13/19 0500    Weight: 95.7 kg 104.6 kg 102.2 kg    Examination:  General exam: Appears calm and comfortable  Respiratory system: Clear to auscultation. Respiratory effort normal.  Dressing on xiphoid area. Cardiovascular system: S1 & S2 heard, RRR. No JVD, murmurs, rubs, gallops or clicks. No pedal edema. Gastrointestinal system: Abdomen is nondistended, soft and nontender. No organomegaly or masses felt. Normal bowel sounds heard. Central nervous system: Alert and oriented. No focal neurological deficits. Extremities: Decreased edema to the right upper extremity and left upper extremity.   Skin: No rashes, lesions or ulcers Psychiatry: Judgement and insight appear normal. Mood & affect appropriate.     Data Reviewed: I have personally reviewed following labs and imaging studies  CBC: Recent Labs  Lab 10/10/19 0311 10/11/19 0500 10/12/19 0353 10/13/19 0500 10/14/19 1220 10/15/19 0738 10/15/19 2351  WBC 11.4* 11.9* 11.0* 10.9* 12.5* 7.7 10.1  NEUTROABS 7.7 10.2* 7.1 7.4 10.9*  --   --   HGB 10.2* 9.9* 9.9* 9.8* 11.1* 9.8* 10.3*  HCT 30.7* 30.3* 30.5* 30.1* 33.8* 31.1* 31.8*  MCV 89.8 90.7 91.6 91.2 91.4 93.1 91.1  PLT 251 300 283 296 375 324 [id number]   Basic Metabolic Panel: Recent Labs  Lab 10/10/19 0311 10/11/19 0500 10/12/19 0353 10/13/19 0500 10/14/19 1220 10/15/19 0738 10/15/19 2351  NA 137 137 137 137 140  --   --   K 4.1 4.0 3.8 3.7 3.5  --   --   CL 102 103 102 101 105  --   --   CO2 20* 23 23 22 24   --   --   GLUCOSE 167* 167* 149* 150* 159*  --   --   BUN 8 9 6 7 6   --   --  CREATININE 0.72 0.85 0.54 0.62 0.72  --   --   CALCIUM 9.2 9.2 8.9 8.9 8.9  --   --   MG 2.0 2.0 1.9 2.0 2.0  --   --   PHOS 5.5* 5.4* 4.7* 4.9* 4.1 4.9* 4.0   GFR: Estimated Creatinine Clearance: 96.5 mL/min (by C-G formula based on SCr of 0.72 mg/dL). Liver Function Tests: Recent Labs  Lab 10/10/19 0311 10/11/19 0500 10/12/19 0353 10/13/19 0500 10/14/19 1220  AST 32 27 26 23 27   ALT  50* 46* 39 34 32  ALKPHOS 139* 133* 116 103 98  BILITOT 0.8 1.0 1.2 1.1 1.1  PROT 5.5* 5.7* 5.3* 5.1* 5.8*  ALBUMIN 2.7* 2.7* 2.8* 2.6* 2.8*   No results for input(s): LIPASE, AMYLASE in the last 168 hours. No results for input(s): AMMONIA in the last 168 hours. Coagulation Profile: Recent Labs  Lab 10/12/19 0353 10/13/19 0500 10/14/19 1220 10/15/19 0738 10/15/19 2351  INR 1.2 1.3* 1.3* 1.4* 1.5*   Cardiac Enzymes: No results for input(s): CKTOTAL, CKMB, CKMBINDEX, TROPONINI in the last 168 hours. BNP (last 3 results) No results for input(s): PROBNP in the last 8760 hours. HbA1C: No results for input(s): HGBA1C in the last 72 hours. CBG: No results for input(s): GLUCAP in the last 168 hours. Lipid Profile: No results for input(s): CHOL, HDL, LDLCALC, TRIG, CHOLHDL, LDLDIRECT in the last 72 hours. Thyroid Function Tests: No results for input(s): TSH, T4TOTAL, FREET4, T3FREE, THYROIDAB in the last 72 hours. Anemia Panel: Recent Labs    10/15/19 0738 10/15/19 2351  FERRITIN 706* 669*   Sepsis Labs: No results for input(s): PROCALCITON, LATICACIDVEN in the last 168 hours.  Recent Results (from the past 240 hour(s))  Body fluid culture (includes gram stain)     Status: None (Preliminary result)   Collection Time: 10/15/19  3:30 PM   Specimen: Pleural Fluid  Result Value Ref Range Status   Specimen Description PLEURAL  Final   Special Requests NONE  Final   Gram Stain   Final    RARE WBC PRESENT, PREDOMINANTLY MONONUCLEAR NO ORGANISMS SEEN    Culture   Final    NO GROWTH < 24 HOURS Performed at [facility], [address] [place], [place] [address]    Report Status PENDING  Incomplete         Radiology Studies: DG Chest 1 View  Result Date: 10/15/2019 CLINICAL DATA:  Post thoracentesis EXAM: CHEST  1 VIEW COMPARISON:  10/09/2019 FINDINGS: Improvement in small bilateral effusions and bibasilar atelectasis. No pneumothorax post thoracentesis.  IMPRESSION: No pneumothorax post thoracentesis. Improvement in bilateral effusions and bibasilar consolidation. Electronically Signed   By: Franchot Gallo M.D.   On: 10/15/2019 15:54   ECHOCARDIOGRAM LIMITED  Result Date: 10/15/2019   ECHOCARDIOGRAM LIMITED REPORT   Patient Name:   Veronica [name] Date of Exam: 10/15/2019 Medical Rec #:  [id number]      Height:       61.0 in Accession #:    [id number]     Weight:       225.3 lb Date of Birth:  09/16/1971      BSA:          1.99 m Patient Age:    69 years       BP:           82/69 mmHg Patient Gender: F              HR:  101 bpm. Exam Location:  Inpatient  Procedure: Limited Echo, Cardiac Doppler and Limited Color Doppler Indications:    I31.3 Pericardial effusion  History:        Patient has prior history of Echocardiogram examinations, most                 recent 10/08/2019. COVID-19 Positive. Cancer.  Sonographer:    Jonelle Sidle Dance Referring Phys: Fifty-Six  1. Left ventricular ejection fraction, by visual estimation, is 60 to 65%. The left ventricle has normal function. There is no increased left ventricular wall thickness.  2. The left ventricle has no regional wall motion abnormalities.  3. Global right ventricle has normal systolc function.The right ventricular size is normal. no increase in right ventricular wall thickness.  4. The mitral valve is normal in structure. No evidence of mitral valve regurgitation.  5. The tricuspid valve was normal in structure. Tricuspid valve regurgitation is mild.  6. Tricuspid valve regurgitation is mild.  7. The pulmonic valve was normal in structure.  8. Mildly elevated pulmonary artery systolic pressure.  9. The atrial septum is grossly normal. FINDINGS  Left Ventricle: Left ventricular ejection fraction, by visual estimation, is 60 to 65%. The left ventricle has normal function. The left ventricle has no regional wall motion abnormalities. There is no increased left ventricular wall  thickness. Right Ventricle: The right ventricular size is normal. No increase in right ventricular wall thickness. Global RV systolic function is has normal systolic function. The tricuspid regurgitant velocity is 2.62 m/s, and with an assumed right atrial pressure  of 8 mmHg, the estimated right ventricular systolic pressure is mildly elevated at 35.5 mmHg. Left Atrium: Left atrial size was normal in size. Right Atrium: Right atrial size was normal in size. Right atrial pressure is estimated at 8 mmHg. Pericardium: There is no evidence of pericardial effusion is seen. There is no evidence of pericardial effusion. Mitral Valve: The mitral valve is normal in structure. MV Area by PHT, 4.15 cm. MV PHT, 53.07 msec. No evidence of mitral valve regurgitation. Tricuspid Valve: The tricuspid valve is normal in structure. Tricuspid valve regurgitation is mild. Aortic Valve: The aortic valve is normal in structure. Aortic valve regurgitation is not visualized. Pulmonic Valve: The pulmonic valve was normal in structure. Pulmonic valve regurgitation is not visualized by color flow Doppler. Pulmonic regurgitation is not visualized by color flow Doppler. Aorta: The aortic root and ascending aorta are structurally normal, with no evidence of dilitation. Shunts: The atrial septum is grossly normal.  LEFT VENTRICLE          Normals PLAX 2D LVIDd:         3.70 cm  3.6 cm LVIDs:         2.52 cm  1.7 cm LV PW:         0.77 cm  1.4 cm LV IVS:        0.80 cm  1.3 cm LVOT diam:     1.90 cm  2.0 cm LV SV:         35 ml    79 ml LV SV Index:   16.39    45 ml/m2 LVOT Area:     2.84 cm 3.14 cm2  IVC IVC diam: 1.92 cm LEFT ATRIUM             Index LA diam:        3.70 cm 1.86 cm/m LA Vol (A2C):   39.6 ml 19.93 ml/m LA  Vol (A4C):   36.9 ml 18.57 ml/m LA Biplane Vol: 40.2 ml 20.23 ml/m  AORTIC VALVE             Normals LVOT Vmax:   122.00 cm/s LVOT Vmean:  70.700 cm/s 75 cm/s LVOT VTI:    0.206 m     25.3 cm  AORTA                 Normals  Ao Root diam: 2.60 cm 31 mm Ao Asc diam:  2.40 cm 31 mm MITRAL VALVE              Normals   TRICUSPID VALVE             Normals MV Area (PHT): 4.15 cm             TR Peak grad:   27.5 mmHg MV PHT:        53.07 msec 55 ms     TR Vmax:        262.00 cm/s 288 cm/s MV Decel Time: 183 msec   187 ms MV E velocity: 82.50 cm/s 103 cm/s  SHUNTS MV A velocity: 68.90 cm/s 70.3 cm/s Systemic VTI:  0.21 m MV E/A ratio:  1.20       1.5       Systemic Diam: 1.90 cm  Mertie Moores MD Electronically signed by Mertie Moores MD Signature Date/Time: 10/15/2019/11:13:54 AMThe mitral valve is normal in structure.    Final         Scheduled Meds: . Chlorhexidine Gluconate Cloth  6 each Topical Daily  . famotidine  10 mg Oral BID  . feeding supplement (ENSURE ENLIVE)  237 mL Oral TID BM  . feeding supplement (PRO-STAT SUGAR FREE 64)  30 mL Oral BID  . furosemide  10 mg Oral Daily  . mouth rinse  15 mL Mouth Rinse BID  . metoprolol tartrate  25 mg Oral BID  . sodium chloride flush  10-40 mL Intracatheter Q12H  . Warfarin - Pharmacist Dosing Inpatient   Does not apply q1800   Continuous Infusions: . sodium chloride Stopped (10/05/19 0829)  . heparin 1,500 Units/hr (10/16/19 0058)     LOS: 18 days     Georgette Shell, MD [facility]   If 7PM-7AM, please contact night-coverage [email or web address] Password [email or web address] 10/16/2019, 2:07 PM

## 2019-10-16 NOTE — Op Note (Signed)
  CARDIOVASCULAR SURGERY OPERATIVE NOTE  10/16/2019  Surgeon:  Gaye Pollack, MD  First Assistant: None   Preoperative Diagnosis:  Dehiscence of epigastric wound following subxyphoid pericardial window   Postoperative Diagnosis:  Same   Procedure:  Debridement of wound and placement of Wound VAC.  Anesthesia:  General Endotracheal   Clinical History/Surgical Indication:  The patient is a 49 year old woman with metastatic breast cancer and COVID-19 positive who presented with SVC thrombus with occlusion and was also found to be Covid-19 +. She was undergoing percutaneous SVC thrombectomy by Dr. Trula Slade on 10/02/2019 and then developed progressive hypotension and tachycardic. TEE showed development of an enlarging pericardial effusion.  She underwent emergent subxiphoid pericardial window with drainage of 900 cc of dark venous blood and no further bleeding stopped after reversal of heparin.  A pericardial drainage tube was placed through separate stab incision and the epigastric wound was closed in the usual manner.  Over the past few days she has developed dehiscence of the wound which I personally examined yesterday.  The skin edges were completely separated and superficial subcutaneous tissue has begun to separate with some exudate over the subcutaneous fat.  There is no drainage and no sign of cellulitis.  I felt the best course of action would be to explore the wound and perform further debridement in the operating room under anesthesia and placement of a wound VAC.  I discussed the operative procedure with the patient including alternatives, benefits, and risk including but not limited to bleeding, need for further debridement, and slow wound healing due to her morbid obesity.  She understood and agreed to proceed.  Preparation:  The patient was was taken directly to the operating room due to  her COVID-19 positivity.  She was positioned supine on the operating room table. After being placed under MAC by the anesthesia team the chest and abdomen were prepped with betadine soap and solution and draped in the usual sterile manner. A surgical time-out was taken and the correct patient and operative procedure were confirmed with the nursing and anesthesia staff.   Wound debridement:  The wound was explored and it was only the superficial subcutaneous tissue which had separated.  There was some fibrinous exudate over the subcutaneous tissue which was sharply debrided.  The subcutaneous fat did appear viable.  There is no active bleeding.  The remaining subcutaneous sutures and subcuticular sutures were removed.  The fascial sutures were only visible inferiorly and were not removed.  There is not appear to be any active infection.  Application of wound VAC:  A black wound VAC sponge was cut to the appropriate size and shape and position in the wound.  This was covered with the occlusive plastic adhesive dressing and connected to suction at 75 mm continuous.  There not appear to be any air leaks.  The sponge, needle, and instrument counts were correct according the scrub nurse.  The patient was then transported to the postanesthesia care unit by anesthesia in stable condition.

## 2019-10-16 NOTE — Progress Notes (Signed)
[place] for Heparin  Indication: pulmonary embolus   Patient Measurements: Height: 5\' 1"  (154.9 cm) Weight: 225 lb 5 oz (102.2 kg) IBW/kg (Calculated) : 47.8 Heparin Dosing Weight: 71 kg  Vital Signs: Temp: 97.7 F (36.5 C) (01/05 1302) Temp Source: Oral (01/05 1302) BP: 123/73 (01/05 1302) Pulse Rate: 85 (01/05 1302)  Labs: Recent Labs    10/14/19 1220 10/15/19 0738 10/15/19 2351 10/16/19 0902  HGB 11.1* 9.8* 10.3*  --   HCT 33.8* 31.1* 31.8*  --   PLT 375 324 359  --   LABPROT 16.2* 16.8* 17.7*  --   INR 1.3* 1.4* 1.5*  --   HEPARINUNFRC  --  0.38 0.23* 0.36  CREATININE 0.72  --   --   --     Assessment: Pharmacy consulted to dose/monitor heparin in this 49 year old female with PMH significant for breast cancer on chemotherapy with curative intent. CT angio on 12/18 revealed acute bilateral PE, acute occlusive SVC thrombosis and right lower extremity DVT.  Found to have bilateral DVTs in right and left upper extremities. Pharmacy dosing heparin and warfarin.  Pt scheduled for OR today (1/5) for debridement of dehisced midline surgical incision and application of wound vac; warfarin was held last evening in anticipation of procedure today.  Heparin level was 0.36 units/ml (in goal range) this AM on heparin infusion at 1500 units/hr prior to being stopped for procedure. CBC, INR were ordered with AM labs today, but were not done; last INR was 1.5 on 10/15/19.  Per Dr. Vivi Martens note, heparin can be restarted immediately post op and warfarin can be continued. Per RN, no bleeding observed post procedure.  Goal of Therapy:  Heparin level = 0.3-0.5 (per MD request) INR 2-2.4 per MD request Monitor platelets by anticoagulation protocol: Yes   Plan:   Restart heparin at 1500 units/hr Check heparin level in 6 hours Warfarin 7.5 mg PO X 1 this evening Monitor daily heparin level, INR, CBC Monitor for signs/symptoms of  bleeding  Gillermina Hu, PharmD, BCPS, [facility] Clinical Pharmacist 10/16/19, 17:24 PM

## 2019-10-16 NOTE — Anesthesia Preprocedure Evaluation (Signed)
Anesthesia Evaluation  Patient identified by MRN, date of birth, ID band Patient awake    Reviewed: Allergy & Precautions, NPO status , Patient's Chart, lab work & pertinent test results  Airway Mallampati: III  TM Distance: >3 FB Neck ROM: Full    Dental  (+) Teeth Intact, Dental Advisory Given   Pulmonary neg pulmonary ROS,  Pleural effusion s/p drainage.   breath sounds clear to auscultation       Cardiovascular hypertension, Pt. on medications + DVT   Rhythm:Regular Rate:Normal  S/p pericardial window.   Neuro/Psych negative neurological ROS  negative psych ROS   GI/Hepatic Neg liver ROS, GERD  Medicated,  Endo/Other  negative endocrine ROS  Renal/GU negative Renal ROS     Musculoskeletal negative musculoskeletal ROS (+)   Abdominal (+) + obese,   Peds  Hematology  (+) anemia ,   Anesthesia Other Findings   Reproductive/Obstetrics                             Lab Results  Component Value Date   WBC 10.1 10/15/2019   HGB 10.3 (L) 10/15/2019   HCT 31.8 (L) 10/15/2019   MCV 91.1 10/15/2019   PLT 359 10/15/2019   Lab Results  Component Value Date   CREATININE 0.72 10/14/2019   BUN 6 10/14/2019   NA 140 10/14/2019   K 3.5 10/14/2019   CL 105 10/14/2019   CO2 24 10/14/2019    Anesthesia Physical  Anesthesia Plan  ASA: III  Anesthesia Plan: General   Post-op Pain Management:    Induction: Intravenous  PONV Risk Score and Plan: 4 or greater and Ondansetron, Dexamethasone, Midazolam and Scopolamine patch - Pre-op  Airway Management Planned: Oral ETT and Video Laryngoscope Planned  Additional Equipment:   Intra-op Plan:   Post-operative Plan: Extubation in OR  Informed Consent: I have reviewed the patients History and Physical, chart, labs and discussed the procedure including the risks, benefits and alternatives for the proposed anesthesia with the patient or  authorized representative who has indicated his/her understanding and acceptance.     Dental advisory given  Plan Discussed with: CRNA  Anesthesia Plan Comments:         Anesthesia Quick Evaluation

## 2019-10-17 ENCOUNTER — Encounter: Payer: Self-pay | Admitting: *Deleted

## 2019-10-17 LAB — CBC
HCT: 31.3 % — ABNORMAL LOW (ref 36.0–46.0)
Hemoglobin: 9.8 g/dL — ABNORMAL LOW (ref 12.0–15.0)
MCH: 29.5 pg (ref 26.0–34.0)
MCHC: 31.3 g/dL (ref 30.0–36.0)
MCV: 94.3 fL (ref 80.0–100.0)
Platelets: 335 10*3/uL (ref 150–400)
RBC: 3.32 MIL/uL — ABNORMAL LOW (ref 3.87–5.11)
RDW: 15.9 % — ABNORMAL HIGH (ref 11.5–15.5)
WBC: 7.2 10*3/uL (ref 4.0–10.5)
nRBC: 0.3 % — ABNORMAL HIGH (ref 0.0–0.2)

## 2019-10-17 LAB — D-DIMER, QUANTITATIVE: D-Dimer, Quant: 3.97 ug/mL-FEU — ABNORMAL HIGH (ref 0.00–0.50)

## 2019-10-17 LAB — BASIC METABOLIC PANEL
Anion gap: 8 (ref 5–15)
BUN: <5 mg/dL — ABNORMAL LOW (ref 6–20)
CO2: 23 mmol/L (ref 22–32)
Calcium: 8.7 mg/dL — ABNORMAL LOW (ref 8.9–10.3)
Chloride: 110 mmol/L (ref 98–111)
Creatinine, Ser: 0.65 mg/dL (ref 0.44–1.00)
GFR calc Af Amer: >60 mL/min (ref >60)
GFR calc non Af Amer: >60 mL/min (ref >60)
Glucose, Bld: 101 mg/dL — ABNORMAL HIGH (ref 70–99)
Potassium: 3.8 mmol/L (ref 3.5–5.1)
Sodium: 141 mmol/L (ref 135–145)

## 2019-10-17 LAB — PHOSPHORUS: Phosphorus: 4.9 mg/dL — ABNORMAL HIGH (ref 2.5–4.6)

## 2019-10-17 LAB — C-REACTIVE PROTEIN: CRP: 2.7 mg/dL — ABNORMAL HIGH (ref <1.0)

## 2019-10-17 LAB — FERRITIN: Ferritin: 648 ng/mL — ABNORMAL HIGH (ref 11–307)

## 2019-10-17 LAB — HEPARIN LEVEL (UNFRACTIONATED)
Heparin Unfractionated: 0.43 IU/mL (ref 0.30–0.70)
Heparin Unfractionated: 0.36 IU/mL (ref 0.30–0.70)

## 2019-10-17 LAB — PROTIME-INR
INR: 1.5 — ABNORMAL HIGH (ref 0.8–1.2)
Prothrombin Time: 17.7 seconds — ABNORMAL HIGH (ref 11.4–15.2)

## 2019-10-17 LAB — CYTOLOGY - NON PAP

## 2019-10-17 MED ORDER — WARFARIN SODIUM 7.5 MG PO TABS
7.5000 mg | ORAL_TABLET | Freq: Once | ORAL | Status: AC
  Administered 2019-10-17: 7.5 mg via ORAL
  Filled 2019-10-17: qty 1

## 2019-10-17 NOTE — Progress Notes (Signed)
ANTICOAGULATION CONSULT NOTE  Pharmacy Consult for Heparin  Indication: pulmonary embolus   Patient Measurements: Height: 5\' 1"  (154.9 cm) Weight: 240 lb 11.9 oz (109.2 kg) IBW/kg (Calculated) : 47.8 Heparin Dosing Weight: 71 kg  Vital Signs: Temp: 98.3 F (36.8 C) (01/06 1210) Temp Source: Oral (01/06 1210) BP: 117/69 (01/06 1210) Pulse Rate: 81 (01/06 1210)  Labs: Recent Labs    10/15/19 0738 10/15/19 2351 10/16/19 2312 10/17/19 0500 10/17/19 0752 10/17/19 0835 10/17/19 1049 10/17/19 1725  HGB 9.8* 10.3*  --  9.8*  --   --   --   --   HCT 31.1* 31.8*  --  31.3*  --   --   --   --   PLT 324 359  --  335  --   --   --   --   LABPROT 16.8* 17.7*  --   --   --  17.7*  --   --   INR 1.4* 1.5*  --   --   --  1.5*  --   --   HEPARINUNFRC 0.38 0.23* 0.27*  --   --   --  0.43 0.36  CREATININE  --   --   --   --  0.65  --   --   --     Assessment: Pharmacy consulted to dose/monitor heparin in this 49 year old female with PMH significant for breast cancer on chemotherapy with curative intent. CT angio on 12/18 revealed acute bilateral PE, acute occlusive SVC thrombosis and right lower extremity DVT.  Found to have bilateral DVTs in right and left upper extremities. Pharmacy dosing heparin and warfarin. -heparin level at goal on 1550 units/hr   Goal of Therapy:  Heparin level = 0.3-0.5 (per MD request) INR 2-2.4 per MD request Monitor platelets by anticoagulation protocol: Yes   Plan:   -Continue heparin at 1550 units/hr -Daily heparin level and CBC  [place] Please utilize Amion for appropriate phone number to reach the unit pharmacist ([place])

## 2019-10-17 NOTE — Progress Notes (Signed)
ANTICOAGULATION CONSULT NOTE  Pharmacy Consult for Heparin  Indication: pulmonary embolus   Patient Measurements: Height: 5\' 1"  (154.9 cm) Weight: 240 lb 11.9 oz (109.2 kg) IBW/kg (Calculated) : 47.8 Heparin Dosing Weight: 71 kg  Vital Signs: Temp: 98.3 F (36.8 C) (01/06 1210) Temp Source: Oral (01/06 1210) BP: 117/69 (01/06 1210) Pulse Rate: 81 (01/06 1210)  Labs: Recent Labs    10/15/19 0738 10/15/19 2351 10/16/19 0902 10/16/19 2312 10/17/19 0500 10/17/19 0752 10/17/19 0835 10/17/19 1049  HGB 9.8* 10.3*  --   --  9.8*  --   --   --   HCT 31.1* 31.8*  --   --  31.3*  --   --   --   PLT 324 359  --   --  335  --   --   --   LABPROT 16.8* 17.7*  --   --   --   --  17.7*  --   INR 1.4* 1.5*  --   --   --   --  1.5*  --   HEPARINUNFRC 0.38 0.23* 0.36 0.27*  --   --   --  0.43  CREATININE  --   --   --   --   --  0.65  --   --     Assessment: Pharmacy consulted to dose/monitor heparin in this 49 year old female with PMH significant for breast cancer on chemotherapy with curative intent. CT angio on 12/18 revealed acute bilateral PE, acute occlusive SVC thrombosis and right lower extremity DVT.  Found to have bilateral DVTs in right and left upper extremities. Pharmacy dosing heparin and warfarin.   No bleeding noted.  Heparin level is 0.43 this AM. Will continue current rate. INR 1.5  Goal of Therapy:  Heparin level = 0.3-0.5 (per MD request) INR 2-2.4 per MD request Monitor platelets by anticoagulation protocol: Yes   Plan:   -Continue heparin at 1550 units/hr Confirmatory Heparin level in 6 hours Warfarin 7.5mg  PO x 1 today  Tashanti Dalporto A. Levada Dy, PharmD, BCPS, FNKF Clinical Pharmacist [place] Please utilize Amion for appropriate phone number to reach the unit pharmacist ([place])

## 2019-10-17 NOTE — Plan of Care (Signed)
  Problem: Education: Goal: Knowledge of General Education information will improve Description: Including pain rating scale, medication(s)/side effects and non-pharmacologic comfort measures Outcome: Progressing   Problem: Clinical Measurements: Goal: Ability to maintain clinical measurements within normal limits will improve Outcome: Progressing Goal: Will remain free from infection Outcome: Progressing Goal: Respiratory complications will improve Outcome: Progressing Goal: Cardiovascular complication will be avoided Outcome: Progressing   Problem: Nutrition: Goal: Adequate nutrition will be maintained Outcome: Progressing   Problem: Safety: Goal: Ability to remain free from injury will improve Outcome: Progressing

## 2019-10-17 NOTE — Progress Notes (Signed)
ANTICOAGULATION CONSULT NOTE  Pharmacy Consult for Heparin  Indication: pulmonary embolus   Patient Measurements: Height: 5\' 1"  (154.9 cm) Weight: 225 lb 5 oz (102.2 kg) IBW/kg (Calculated) : 47.8 Heparin Dosing Weight: 71 kg  Vital Signs: Temp: 99.6 F (37.6 C) (01/05 2333) Temp Source: Oral (01/05 2333) BP: 110/60 (01/05 2332) Pulse Rate: 100 (01/05 2332)  Labs: Recent Labs    10/14/19 1220 10/15/19 0738 10/15/19 2351 10/16/19 0902 10/16/19 2312  HGB 11.1* 9.8* 10.3*  --   --   HCT 33.8* 31.1* 31.8*  --   --   PLT 375 324 359  --   --   LABPROT 16.2* 16.8* 17.7*  --   --   INR 1.3* 1.4* 1.5*  --   --   HEPARINUNFRC  --  0.38 0.23* 0.36 0.27*  CREATININE 0.72  --   --   --   --     Assessment: Pharmacy consulted to dose/monitor heparin in this 49 year old female with PMH significant for breast cancer on chemotherapy with curative intent. CT angio on 12/18 revealed acute bilateral PE, acute occlusive SVC thrombosis and right lower extremity DVT.  Found to have bilateral DVTs in right and left upper extremities. Pharmacy dosing heparin and warfarin.  Pt scheduled for OR today (1/5) for debridement of dehisced midline surgical incision and application of wound vac; warfarin was held last evening in anticipation of procedure today.  Heparin level was 0.36 units/ml (in goal range) this AM on heparin infusion at 1500 units/hr prior to being stopped for procedure. CBC, INR were ordered with AM labs today, but were not done; last INR was 1.5 on 10/15/19.  Per Dr. Vivi Martens note, heparin can be restarted immediately post op and warfarin can be continued. Per RN, no bleeding observed post procedure.  1/6 AM update:  Heparin level just below goal Last INR 1.5  Goal of Therapy:  Heparin level = 0.3-0.5 (per MD request) INR 2-2.4 per MD request Monitor platelets by anticoagulation protocol: Yes   Plan:   Inc heparin slightly to 1550 units/hr Re-check heparin level in 6-8  hours Monitor daily heparin level, INR, CBC Monitor for signs/symptoms of bleeding  Narda Bonds, PharmD, BCPS Clinical Pharmacist Phone: [phone]

## 2019-10-17 NOTE — Consult Note (Signed)
[place] Nurse Consult Note: Reason for Consult: Mid-epigastric wound with NPWT placed in OR yesterday by Dr. Cyndia Bent. Wound type: Sugical  Pressure Injury POA: N/A  Consulted noted and first surgical dressing change will be tomorrow, 10/18/19. Supplies ordered to bedside: 2 Medium V.A.C. dressing kits (granufoam), 1 white foam dressing kit.  Note:  NPWT settings are 75 mmHg Continuous.  [place] nursing team will follow, and will remain available to this patient, the nursing, surgical and medical teams.  Please re-consult if needed. Thanks, Maudie Flakes, MSN, RN, [place], Arther Abbott  Pager# [phone]

## 2019-10-17 NOTE — Progress Notes (Signed)
PROGRESS NOTE    Veronica [name]    Code Status: Full Code  [id number] DOB: [birth year] DOA: 09/28/2019  PCP: Blair Heys, [facility] Summary  Is a 49 year old female with history of breast cancer, presented with SVC syndrome and PE.  She was undergoing a cutaneous SVC thrombectomy by Dr. Trula Slade on 10/02/2019 and developed progressive hypotension and tachycardia, TEE showed development of pericardial effusion, underwent emergent subxiphoid pericardial window with drainage of 900 cc venous blood with no further bleeding after reversal of heparin.  Pericardial drainage tube placed.  Has since developed dehiscence of wound and has been evaluated by CT surgery and underwent debridement of wound and wound VAC placement on 1/5 by Dr. Cyndia Bent and restarted on heparin and Coumadin.  A & P   Principal Problem:   Multiple subsegmental pulmonary emboli without acute cor pulmonale (HCC) Active Problems:   Malignant neoplasm of upper-outer quadrant of right breast in female, estrogen receptor positive (HCC)   Thrombocytopenia (HCC)   Superior vena cava thrombosis (HCC)   SVCO (superior vena cava obstruction)   SVC syndrome   Sinus tachycardia   Essential hypertension   Gastroesophageal reflux disease   Pleural effusion   1. Acute bilateral PE with acute SVC syndrome status post mechanical thrombectomy complicated by hemopericardium and tamponade status post xiphoid window/RLE DVT a. Pericardial drain removed by CT surgery 12/27 b. Echo 10/15/2019 without evidence of pericardial effusion, EF 60 to 65% c. Received Keflex for cellulitis at incision site 1/3 d. Improved bilateral upper extremity symptoms e. On heparin and Coumadin f. CT surgery and vascular surgery g. Per vascular surgery: Upper extremity compression garments at discharge 2. Exudative pleural effusion status post thoracentesis 10/15/2019, unknown etiology likely from above a. Pleural fluid LD 95 elevated  b. Monitor 3.  Right lower extremity DVT a. Anticoagulation as above 4. COVID-19 a. Satting well on room air, status post full course of remdesivir 5. Chemo induced pancytopenia in setting of breast cancer a. Pancytopenia resolved 6. Breast cancer a. Oncology was consulted previously, follows with Dr. Ruel Favors outpatient b. Consider reaching back out just for inpatient follow-up 7. GERD a. Pepcid  DVT prophylaxis: Coumadin Heparin Family Communication: No family at bedside Disposition Plan: Barriers to discharge include but clinical stability, likely DC in 2 to 3 days  Consultants  Oncology Vascular surgery CT surgery Pulmonology   Procedures  ETT 12/22 >12/23 L radial art line 12/22 > Right femoral CVL >> 12/25 Thoracentesis 1/4 Pericardial drain removal 12/27 Wound debridement and wound VAC placement 1/5    Antibiotics   Anti-infectives (From admission, onward)   Start     Dose/Rate Route Frequency Ordered Stop   10/07/19 1400  cephALEXin (KEFLEX) capsule 500 mg     500 mg Oral Every 8 hours 10/07/19 1214 10/14/19 2359   10/02/19 1000  remdesivir 100 mg in sodium chloride 0.9 % 100 mL IVPB     100 mg 200 mL/hr over 30 Minutes Intravenous Daily 10/01/19 1121 10/05/19 1042   10/01/19 1200  ciprofloxacin (CIPRO) IVPB 400 mg  Status:  Discontinued     400 mg 200 mL/hr over 60 Minutes Intravenous Every 12 hours 10/01/19 1113 10/03/19 2154   10/01/19 1130  remdesivir 200 mg in sodium chloride 0.9% 250 mL IVPB     200 mg 580 mL/hr over 30 Minutes Intravenous Once 10/01/19 1121 10/01/19 1301           Subjective   Patient seen and examined at  bedside in no acute distress and resting comfortably. No acute events overnight. Denies any acute complaints at this time. Tolerating diet well.   Objective   Vitals:   10/17/19 0500 10/17/19 0759 10/17/19 0800 10/17/19 1210  BP: 99/62 (!) 97/56 107/63 117/69  Pulse: 96   81  Resp: 18 16 16 16   Temp: 99.3 F (37.4 C) 97.6 F (36.4  C)  98.3 F (36.8 C)  TempSrc: Oral Axillary  Oral  SpO2:  93% 93%   Weight:      Height:        Intake/Output Summary (Last 24 hours) at 10/17/2019 1848 Last data filed at 10/17/2019 1800 Gross per 24 hour  Intake 1220 ml  Output 20 ml  Net 1200 ml   Filed Weights   10/12/19 0500 10/13/19 0500 10/17/19 0355  Weight: 104.6 kg 102.2 kg 109.2 kg    Examination:  Physical Exam Vitals and nursing note reviewed.  Constitutional:      Appearance: She is obese.  HENT:     Head: Normocephalic.     Nose: Nose normal.     Mouth/Throat:     Mouth: Mucous membranes are moist.  Eyes:     Extraocular Movements: Extraocular movements intact.  Cardiovascular:     Rate and Rhythm: Normal rate and regular rhythm.  Pulmonary:     Effort: Pulmonary effort is normal. No respiratory distress.  Musculoskeletal:     Cervical back: Normal range of motion.     Comments: Bilateral upper extremity swelling Right lower extremity swelling  Skin:    Coloration: Skin is not jaundiced.  Neurological:     Mental Status: She is alert.  Psychiatric:        Mood and Affect: Mood normal.        Behavior: Behavior normal.     Data Reviewed: I have personally reviewed following labs and imaging studies  CBC: Recent Labs  Lab 10/11/19 0500 10/12/19 0353 10/13/19 0500 10/14/19 1220 10/15/19 0738 10/15/19 2351 10/17/19 0500  WBC 11.9* 11.0* 10.9* 12.5* 7.7 10.1 7.2  NEUTROABS 10.2* 7.1 7.4 10.9*  --   --   --   HGB 9.9* 9.9* 9.8* 11.1* 9.8* 10.3* 9.8*  HCT 30.3* 30.5* 30.1* 33.8* 31.1* 31.8* 31.3*  MCV 90.7 91.6 91.2 91.4 93.1 91.1 94.3  PLT 300 283 296 375 324 [id number]   Basic Metabolic Panel: Recent Labs  Lab 10/11/19 0500 10/12/19 0353 10/13/19 0500 10/14/19 1220 10/15/19 0738 10/15/19 2351 10/17/19 0752 10/17/19 0835  NA 137 137 137 140  --   --  141  --   K 4.0 3.8 3.7 3.5  --   --  3.8  --   CL 103 102 101 105  --   --  110  --   CO2 23 23 22 24   --   --  23  --   GLUCOSE  167* 149* 150* 159*  --   --  101*  --   BUN 9 6 7 6   --   --  <5*  --   CREATININE 0.85 0.54 0.62 0.72  --   --  0.65  --   CALCIUM 9.2 8.9 8.9 8.9  --   --  8.7*  --   MG 2.0 1.9 2.0 2.0  --   --   --   --   PHOS 5.4* 4.7* 4.9* 4.1 4.9* 4.0  --  4.9*   GFR: Estimated Creatinine Clearance: 100.4 mL/min (by C-G  formula based on SCr of 0.65 mg/dL). Liver Function Tests: Recent Labs  Lab 10/11/19 0500 10/12/19 0353 10/13/19 0500 10/14/19 1220  AST 27 26 23 27   ALT 46* 39 34 32  ALKPHOS 133* 116 103 98  BILITOT 1.0 1.2 1.1 1.1  PROT 5.7* 5.3* 5.1* 5.8*  ALBUMIN 2.7* 2.8* 2.6* 2.8*   No results for input(s): LIPASE, AMYLASE in the last 168 hours. No results for input(s): AMMONIA in the last 168 hours. Coagulation Profile: Recent Labs  Lab 10/13/19 0500 10/14/19 1220 10/15/19 0738 10/15/19 2351 10/17/19 0835  INR 1.3* 1.3* 1.4* 1.5* 1.5*   Cardiac Enzymes: No results for input(s): CKTOTAL, CKMB, CKMBINDEX, TROPONINI in the last 168 hours. BNP (last 3 results) No results for input(s): PROBNP in the last 8760 hours. HbA1C: No results for input(s): HGBA1C in the last 72 hours. CBG: No results for input(s): GLUCAP in the last 168 hours. Lipid Profile: No results for input(s): CHOL, HDL, LDLCALC, TRIG, CHOLHDL, LDLDIRECT in the last 72 hours. Thyroid Function Tests: No results for input(s): TSH, T4TOTAL, FREET4, T3FREE, THYROIDAB in the last 72 hours. Anemia Panel: Recent Labs    10/15/19 2351 10/17/19 0835  FERRITIN 669* 648*   Sepsis Labs: No results for input(s): PROCALCITON, LATICACIDVEN in the last 168 hours.  Recent Results (from the past 240 hour(s))  Body fluid culture (includes gram stain)     Status: None (Preliminary result)   Collection Time: 10/15/19  3:30 PM   Specimen: Pleural Fluid  Result Value Ref Range Status   Specimen Description PLEURAL  Final   Special Requests NONE  Final   Gram Stain   Final    RARE WBC PRESENT, PREDOMINANTLY MONONUCLEAR  NO ORGANISMS SEEN    Culture   Final    NO GROWTH 2 DAYS Performed at [facility], [address], [place], [place] [address]    Report Status PENDING  Incomplete         Radiology Studies: No results found.      Scheduled Meds: . Chlorhexidine Gluconate Cloth  6 each Topical Daily  . famotidine  10 mg Oral BID  . feeding supplement (ENSURE ENLIVE)  237 mL Oral TID BM  . feeding supplement (PRO-STAT SUGAR FREE 64)  30 mL Oral BID  . furosemide  10 mg Oral Daily  . mouth rinse  15 mL Mouth Rinse BID  . metoprolol tartrate  25 mg Oral BID  . sodium chloride flush  10-40 mL Intracatheter Q12H  . Warfarin - Pharmacist Dosing Inpatient   Does not apply q1800   Continuous Infusions: . sodium chloride Stopped (10/05/19 0829)  . heparin 1,550 Units/hr (10/17/19 1615)     LOS: 19 days    Time spent: 33 minutes with over 50% of the time coordinating the patient's care    Harold Hedge, DO [facility] Pager [phone]  If 7PM-7AM, please contact night-coverage [email or web address] Password [facility] 10/17/2019, 6:48 PM

## 2019-10-18 ENCOUNTER — Other Ambulatory Visit: Payer: Self-pay | Admitting: Oncology

## 2019-10-18 LAB — BASIC METABOLIC PANEL
Anion gap: 12 (ref 5–15)
BUN: 5 mg/dL — ABNORMAL LOW (ref 6–20)
CO2: 22 mmol/L (ref 22–32)
Calcium: 8.8 mg/dL — ABNORMAL LOW (ref 8.9–10.3)
Chloride: 107 mmol/L (ref 98–111)
Creatinine, Ser: 0.66 mg/dL (ref 0.44–1.00)
GFR calc Af Amer: >60 mL/min (ref >60)
GFR calc non Af Amer: >60 mL/min (ref >60)
Glucose, Bld: 109 mg/dL — ABNORMAL HIGH (ref 70–99)
Potassium: 3.6 mmol/L (ref 3.5–5.1)
Sodium: 141 mmol/L (ref 135–145)

## 2019-10-18 LAB — HEPARIN LEVEL (UNFRACTIONATED): Heparin Unfractionated: 0.41 IU/mL (ref 0.30–0.70)

## 2019-10-18 LAB — PHOSPHORUS: Phosphorus: 4.4 mg/dL (ref 2.5–4.6)

## 2019-10-18 LAB — CBC
HCT: 31.2 % — ABNORMAL LOW (ref 36.0–46.0)
Hemoglobin: 9.8 g/dL — ABNORMAL LOW (ref 12.0–15.0)
MCH: 29.3 pg (ref 26.0–34.0)
MCHC: 31.4 g/dL (ref 30.0–36.0)
MCV: 93.4 fL (ref 80.0–100.0)
Platelets: 314 10*3/uL (ref 150–400)
RBC: 3.34 MIL/uL — ABNORMAL LOW (ref 3.87–5.11)
RDW: 15.7 % — ABNORMAL HIGH (ref 11.5–15.5)
WBC: 7.2 10*3/uL (ref 4.0–10.5)
nRBC: 0.3 % — ABNORMAL HIGH (ref 0.0–0.2)

## 2019-10-18 LAB — PH, BODY FLUID: pH, Body Fluid: 7.8

## 2019-10-18 LAB — BODY FLUID CULTURE: Culture: NO GROWTH

## 2019-10-18 LAB — PROTIME-INR
INR: 1.5 — ABNORMAL HIGH (ref 0.8–1.2)
Prothrombin Time: 18.2 seconds — ABNORMAL HIGH (ref 11.4–15.2)

## 2019-10-18 LAB — C-REACTIVE PROTEIN: CRP: 3 mg/dL — ABNORMAL HIGH (ref <1.0)

## 2019-10-18 LAB — FERRITIN: Ferritin: 550 ng/mL — ABNORMAL HIGH (ref 11–307)

## 2019-10-18 LAB — D-DIMER, QUANTITATIVE: D-Dimer, Quant: 3.38 ug/mL-FEU — ABNORMAL HIGH (ref 0.00–0.50)

## 2019-10-18 MED ORDER — RIVAROXABAN 20 MG PO TABS: 20.0000 mg | ORAL_TABLET | Freq: Every day | ORAL | Status: DC

## 2019-10-18 MED ORDER — METOPROLOL TARTRATE 25 MG PO TABS: 25.0000 mg | ORAL_TABLET | Freq: Two times a day (BID) | ORAL | 0 refills | Status: DC

## 2019-10-18 MED ORDER — WARFARIN SODIUM 5 MG PO TABS: 10.0000 mg | ORAL_TABLET | Freq: Once | ORAL | Status: DC

## 2019-10-18 MED ORDER — RIVAROXABAN 15 MG PO TABS
15.0000 mg | ORAL_TABLET | Freq: Two times a day (BID) | ORAL | Status: DC
  Administered 2019-10-18 – 2019-10-19 (×2): 15 mg via ORAL
  Filled 2019-10-18 (×3): qty 1

## 2019-10-18 MED ORDER — RIVAROXABAN (XARELTO) VTE STARTER PACK (15 & 20 MG): ORAL_TABLET | ORAL | 0 refills | Status: DC

## 2019-10-18 NOTE — Progress Notes (Signed)
Pt will follow up with Dr. Trula Slade in 4 weeks with bilateral upper extremity venous duplex.  Our office will call pt to arrange appointment.   Leontine Locket, [facility] 10/18/2019 12:41 PM

## 2019-10-18 NOTE — Progress Notes (Signed)
      [address]       [place] [address]             [phone]     Confirmed with nursing that the wound vac will be changed Monday, Wednesday, and Friday. Next change will be on 1/8. Order placed for wound vac and communication order added for nursing.    Nicholes Rough, PA-C

## 2019-10-18 NOTE — TOC Initial Note (Signed)
Transition of Care [facility]) - Initial/Assessment Note    Patient Details  Name: Veronica [name] MRN: [id number] Date of Birth: Mar 05, 1971  Transition of Care [facility]) CM/SW Contact:    Maryclare Labrador, RN Phone Number: 10/18/2019, 3:15 PM   Clinical Narrative:  PTA completely independent from home with spouse.  Pt has PCP and denied barriers with paying for medications.  Pt will discharge home with a wound vac - CVTS request [facility] for vac DME agency - pt is in agreement.  [facility] accepted referral and will inform CM of ETA.  CM provide medicare.gov HH list choice - pt chose [facility] accepts referral.  PXarelto benefit check submitted     Alvis Lemmings will not be able to start services until Monday.  CM exhausted Medicare.gov HH list however no other agency could accept pt/start prior to Monday.  Per attending and CVTS start of care of Monday for [facility] is acceptable.   [facility] ETA for wound vac will be tomorrow.  Pt will have wound vac changed here at [facility] in the am prior to discharge.  CM called free 30 day benefit into pts pharmacy - pharmacist informed CM that she is very familiar with pt and that she will personally assist pt in obtaining reduced copay benefit for continued supply.    Expected Discharge Plan: [place]     Patient Goals and CMS Choice   CMS Medicare.gov Compare Post Acute Care list provided to:: Patient Choice offered to / list presented to : Patient  Expected Discharge Plan and Services Expected Discharge Plan: [place] Choice: [place] arrangements for the past 2 months: Single Family Home                 DME Arranged: Negative pressure wound device DME Agency: [facility] Date DME Agency Contacted: 10/18/19 Time DME Agency Contacted: [phone] Representative spoke with at DME Agency: Leontine Locket HH Arranged: RN [place] Agency: [place] Date [place]: 10/18/19 Time [place]:  [place] Representative spoke with at [place]: [place] Arrangements/Services Living arrangements for the past 2 months: [place] Lives with:: Spouse Patient language and need for interpreter reviewed:: Yes Do you feel safe going back to the place where you live?: Yes      Need for Family Participation in Patient Care: Yes (Comment) Care giver support system in place?: Yes (comment)   Criminal Activity/Legal Involvement Pertinent to Current Situation/Hospitalization: No - Comment as needed  Activities of Daily Living Home Assistive Devices/Equipment: Blood pressure cuff, Eyeglasses, Grab bars in shower, Scales ADL Screening (condition at time of admission) Patient's cognitive ability adequate to safely complete daily activities?: Yes Is the patient deaf or have difficulty hearing?: No Does the patient have difficulty seeing, even when wearing glasses/contacts?: No Does the patient have difficulty concentrating, remembering, or making decisions?: No Patient able to express need for assistance with ADLs?: Yes Does the patient have difficulty dressing or bathing?: No Independently performs ADLs?: Yes (appropriate for developmental age) Does the patient have difficulty walking or climbing stairs?: No Weakness of Legs: None Weakness of Arms/Hands: None  Permission Sought/Granted         Permission granted to share info w AGENCY: [facility] and [employer]        Emotional Assessment   Attitude/Demeanor/Rapport: Gracious, Charismatic, Self-Confident, Engaged Affect (typically observed): Accepting, Adaptable Orientation: : Oriented to Self, Oriented  to Place, Oriented to  Time, Oriented to Situation   Psych Involvement: No (comment)  Admission diagnosis:  SVC syndrome [I87.1] SVC (superior vena cava obstruction) [I87.1] SVCO (superior vena cava obstruction) [I87.1] Multiple subsegmental pulmonary emboli without acute cor pulmonale (HCC) [I26.94] Patient Active Problem  List   Diagnosis Date Noted  . Pleural effusion   . Sinus tachycardia   . Essential hypertension   . Gastroesophageal reflux disease   . SVC syndrome 09/29/2019  . COVID-19 virus infection   . Angioedema 09/28/2019  . Multiple subsegmental pulmonary emboli without acute cor pulmonale ([place]) 09/28/2019  . Superior vena cava thrombosis ([place]) 09/28/2019  . SVCO (superior vena cava obstruction) 09/28/2019  . Cough   . Sepsis ([place]) 08/29/2019  . Neutropenia with fever ([place]) 08/29/2019  . Hematochezia 08/29/2019  . Anemia 08/29/2019  . Thrombocytopenia ([place]) 08/29/2019  . Port-A-Cath in place 07/24/2019  . Genetic testing 05/17/2019  . Family history of leukemia   . Family history of stomach cancer   . Malignant neoplasm of upper-outer quadrant of right breast in female, estrogen receptor positive ([place]) 05/07/2019  . S/P laparoscopic assisted vaginal hysterectomy (LAVH) 05/17/2011    Class: Chronic  . FOOT PAIN, RIGHT 07/11/2010   PCP:  Blair Heys, PA-C Pharmacy:   [place], [place] - [address] [place] [address] Phone: [phone] Fax: [phone]  [facility] [phone]) [place], [place] - [address] [place] [address] Phone: [phone] Fax: [phone]     Social Determinants of Health (SDOH) Interventions    Readmission Risk Interventions No flowsheet data found.

## 2019-10-18 NOTE — Progress Notes (Signed)
ANTICOAGULATION CONSULT NOTE  Pharmacy Consult for Heparin  Indication: pulmonary embolus   Patient Measurements: Height: 5\' 1"  (154.9 cm) Weight: 240 lb 11.9 oz (109.2 kg) IBW/kg (Calculated) : 47.8 Heparin Dosing Weight: 71 kg  Vital Signs: Temp: 98 F (36.7 C) (01/07 0521) Temp Source: Oral (01/07 0521) BP: 141/99 (01/07 0833) Pulse Rate: 94 (01/07 0521)  Labs: Recent Labs    10/15/19 2351 10/17/19 0500 10/17/19 0752 10/17/19 0835 10/17/19 1049 10/17/19 1725 10/18/19 0346 10/18/19 0928  HGB 10.3* 9.8*  --   --   --   --  9.8*  --   HCT 31.8* 31.3*  --   --   --   --  31.2*  --   PLT 359 335  --   --   --   --  314  --   LABPROT 17.7*  --   --  17.7*  --   --  18.2*  --   INR 1.5*  --   --  1.5*  --   --  1.5*  --   HEPARINUNFRC 0.23*  --   --   --  0.43 0.36  --  0.41  CREATININE  --   --  0.65  --   --   --  0.66  --     Assessment: Pharmacy consulted to dose/monitor heparin in this 49 year old female with PMH significant for breast cancer on chemotherapy with curative intent. CT angio on 12/18 revealed acute bilateral PE, acute occlusive SVC thrombosis and right lower extremity DVT.  Found to have bilateral DVTs in right and left upper extremities. Pharmacy dosing heparin and warfarin.   No bleeding noted.  Heparin level is 0.41 this AM. Will continue current rate. INR remains 1.5  Goal of Therapy:  Heparin level = 0.3-0.5 (per MD request) INR 2-2.4 per MD request Monitor platelets by anticoagulation protocol: Yes   Plan:   -Continue heparin at 1550 units/hr Warfarin 10 mg PO x 1 today  Catheline Hixon A. Levada Dy, PharmD, BCPS, FNKF Clinical Pharmacist [place] Please utilize Amion for appropriate phone number to reach the unit pharmacist ([place])

## 2019-10-18 NOTE — Discharge Instructions (Signed)

## 2019-10-18 NOTE — Care Management (Signed)
CM reached out to Cardiothoracic group to determine if home wound vac equipment will need to be ordered.  Group to follow up with CM

## 2019-10-18 NOTE — Progress Notes (Signed)
PROGRESS NOTE    Veronica [name]    Code Status: Full Code  [id number] DOB: August 13, 1971 DOA: 09/28/2019  PCP: Blair Heys, [employer] Summary  Is a 49 year old female with history of breast cancer, presented with SVC syndrome and PE.  She was undergoing a cutaneous SVC thrombectomy by Dr. Trula Slade on 10/02/2019 and developed progressive hypotension and tachycardia, TEE showed development of pericardial effusion, underwent emergent subxiphoid pericardial window with drainage of 900 cc venous blood with no further bleeding after reversal of heparin.  Pericardial drainage tube placed.  Has since developed dehiscence of wound and has been evaluated by CT surgery and underwent debridement of wound and wound VAC placement on 1/5 by Dr. Cyndia Bent and restarted on heparin and Coumadin.  A & P   Principal Problem:   Multiple subsegmental pulmonary emboli without acute cor pulmonale (HCC) Active Problems:   Malignant neoplasm of upper-outer quadrant of right breast in female, estrogen receptor positive (HCC)   Thrombocytopenia (HCC)   Superior vena cava thrombosis (HCC)   SVCO (superior vena cava obstruction)   SVC syndrome   Sinus tachycardia   Essential hypertension   Gastroesophageal reflux disease   Pleural effusion   1. Acute bilateral PE with acute SVC syndrome status post mechanical thrombectomy complicated by hemopericardium and tamponade status post xiphoid window/RLE DVT a. Pericardial drain removed by CT surgery 12/27 b. Echo 10/15/2019 without evidence of pericardial effusion, EF 60 to 65% c. Received Keflex for cellulitis at incision site 1/3 d. Improved bilateral upper extremity symptoms e. Currently on heparin and Coumadin f. CT surgery and vascular surgery were on board g. Per vascular surgery: Upper extremity compression garments at discharge h. Discussed anticoagulation with oncology and CT surgery, okay to switch heparin and Coumadin to Xarelto at discharge.  Will give  first dose here per pharmacy consult i. Wound VAC to be changed Monday Wednesday Friday  j. will need to follow-up in CT surgery and vascular office as outpatient 2. Exudative pleural effusion status post thoracentesis 10/15/2019, unknown etiology likely from above a. Pleural fluid LD 95 elevated  b. Stable, on room air and no need for repeat chest x-ray at this time 3. Right lower extremity DVT a. Anticoagulation as above b. No further recommendations per PT/OT 4. COVID-19 a. Satting well on room air, status post full course of remdesivir 5. Chemo induced pancytopenia in setting of breast cancer a. Pancytopenia resolved 6. Breast cancer a. Oncology was consulted previously, follows with Dr. Jana Hakim outpatient b. Consider reaching back out just for inpatient follow-up c. Follow-up outpatient 7. GERD a. Pepcid  DVT prophylaxis: Change Coumadin and heparin to Xarelto Family Communication: Patient requested to update family on her own Disposition Plan: Likely discharge tomorrow after wound VAC change.  Home health nurse unavailable until Monday  Consultants  Oncology Vascular surgery CT surgery Pulmonology   Procedures  ETT 12/22 >12/23 L radial art line 12/22 > Right femoral CVL >> 12/25 Thoracentesis 1/4 Pericardial drain removal 12/27 Wound debridement and wound VAC placement 1/5    Antibiotics   Anti-infectives (From admission, onward)   Start     Dose/Rate Route Frequency Ordered Stop   10/07/19 1400  cephALEXin (KEFLEX) capsule 500 mg     500 mg Oral Every 8 hours 10/07/19 1214 10/14/19 2359   10/02/19 1000  remdesivir 100 mg in sodium chloride 0.9 % 100 mL IVPB     100 mg 200 mL/hr over 30 Minutes Intravenous Daily 10/01/19 1121 10/05/19  1042   10/01/19 1200  ciprofloxacin (CIPRO) IVPB 400 mg  Status:  Discontinued     400 mg 200 mL/hr over 60 Minutes Intravenous Every 12 hours 10/01/19 1113 10/03/19 2154   10/01/19 1130  remdesivir 200 mg in sodium chloride  0.9% 250 mL IVPB     200 mg 580 mL/hr over 30 Minutes Intravenous Once 10/01/19 1121 10/01/19 1301           Subjective   Patient seen and examined at bedside no acute distress and resting comfortably.  No events overnight.  Tolerating diet.  Denies any chest pain, shortness of breath, fever, nausea, vomiting, urinary complaints.  Admits to having bowel movement.  Otherwise ROS negative   Objective   Vitals:   10/17/19 2128 10/18/19 0521 10/18/19 0833 10/18/19 1418  BP: 123/66 (!) 102/59 (!) 141/99 124/76  Pulse:  94    Resp:  16  18  Temp: 98.1 F (36.7 C) 98 F (36.7 C)  98.7 F (37.1 C)  TempSrc:  Oral  Oral  SpO2: 96% 93% 94%   Weight:      Height:        Intake/Output Summary (Last 24 hours) at 10/18/2019 1707 Last data filed at 10/18/2019 1508 Gross per 24 hour  Intake 1284.6 ml  Output 20 ml  Net 1264.6 ml   Filed Weights   10/12/19 0500 10/13/19 0500 10/17/19 0355  Weight: 104.6 kg 102.2 kg 109.2 kg    Examination:  Physical Exam Vitals and nursing note reviewed.  Constitutional:      Appearance: She is obese. She is not ill-appearing.  HENT:     Head: Normocephalic and atraumatic.     Nose: Nose normal.     Mouth/Throat:     Mouth: Mucous membranes are moist.  Eyes:     Extraocular Movements: Extraocular movements intact.  Cardiovascular:     Rate and Rhythm: Normal rate and regular rhythm.  Pulmonary:     Effort: Pulmonary effort is normal.     Breath sounds: Normal breath sounds.  Abdominal:     General: There is no distension.  Musculoskeletal:     Cervical back: Normal range of motion. No rigidity.     Comments: Bilateral upper extremity swelling and right lower extremity swelling  Epigastric wound healing  Neurological:     General: No focal deficit present.     Mental Status: She is alert. Mental status is at baseline.  Psychiatric:        Mood and Affect: Mood normal.        Behavior: Behavior normal.     Data Reviewed: I  have personally reviewed following labs and imaging studies  CBC: Recent Labs  Lab 10/12/19 0353 10/13/19 0500 10/14/19 1220 10/15/19 0738 10/15/19 2351 10/17/19 0500 10/18/19 0346  WBC 11.0* 10.9* 12.5* 7.7 10.1 7.2 7.2  NEUTROABS 7.1 7.4 10.9*  --   --   --   --   HGB 9.9* 9.8* 11.1* 9.8* 10.3* 9.8* 9.8*  HCT 30.5* 30.1* 33.8* 31.1* 31.8* 31.3* 31.2*  MCV 91.6 91.2 91.4 93.1 91.1 94.3 93.4  PLT 283 296 375 324 359 335 [id number]   Basic Metabolic Panel: Recent Labs  Lab 10/12/19 0353 10/13/19 0500 10/14/19 1220 10/15/19 0738 10/15/19 2351 10/17/19 0752 10/17/19 0835 10/18/19 0346  NA 137 137 140  --   --  141  --  141  K 3.8 3.7 3.5  --   --  3.8  --  3.6  CL 102 101 105  --   --  110  --  107  CO2 23 22 24   --   --  23  --  22  GLUCOSE 149* 150* 159*  --   --  101*  --  109*  BUN 6 7 6   --   --  <5*  --  5*  CREATININE 0.54 0.62 0.72  --   --  0.65  --  0.66  CALCIUM 8.9 8.9 8.9  --   --  8.7*  --  8.8*  MG 1.9 2.0 2.0  --   --   --   --   --   PHOS 4.7* 4.9* 4.1 4.9* 4.0  --  4.9* 4.4   GFR: Estimated Creatinine Clearance: 100.4 mL/min (by C-G formula based on SCr of 0.66 mg/dL). Liver Function Tests: Recent Labs  Lab 10/12/19 0353 10/13/19 0500 10/14/19 1220  AST 26 23 27   ALT 39 34 32  ALKPHOS 116 103 98  BILITOT 1.2 1.1 1.1  PROT 5.3* 5.1* 5.8*  ALBUMIN 2.8* 2.6* 2.8*   No results for input(s): LIPASE, AMYLASE in the last 168 hours. No results for input(s): AMMONIA in the last 168 hours. Coagulation Profile: Recent Labs  Lab 10/14/19 1220 10/15/19 0738 10/15/19 2351 10/17/19 0835 10/18/19 0346  INR 1.3* 1.4* 1.5* 1.5* 1.5*   Cardiac Enzymes: No results for input(s): CKTOTAL, CKMB, CKMBINDEX, TROPONINI in the last 168 hours. BNP (last 3 results) No results for input(s): PROBNP in the last 8760 hours. HbA1C: No results for input(s): HGBA1C in the last 72 hours. CBG: No results for input(s): GLUCAP in the last 168 hours. Lipid Profile: No  results for input(s): CHOL, HDL, LDLCALC, TRIG, CHOLHDL, LDLDIRECT in the last 72 hours. Thyroid Function Tests: No results for input(s): TSH, T4TOTAL, FREET4, T3FREE, THYROIDAB in the last 72 hours. Anemia Panel: Recent Labs    10/17/19 0835 10/18/19 0346  FERRITIN 648* 550*   Sepsis Labs: No results for input(s): PROCALCITON, LATICACIDVEN in the last 168 hours.  Recent Results (from the past 240 hour(s))  Body fluid culture (includes gram stain)     Status: None   Collection Time: 10/15/19  3:30 PM   Specimen: Pleural Fluid  Result Value Ref Range Status   Specimen Description PLEURAL  Final   Special Requests NONE  Final   Gram Stain   Final    RARE WBC PRESENT, PREDOMINANTLY MONONUCLEAR NO ORGANISMS SEEN    Culture   Final    NO GROWTH 3 DAYS Performed at [facility], [address] [place], [place] [address]    Report Status 10/18/2019 FINAL  Final         Radiology Studies: No results found.      Scheduled Meds: . Chlorhexidine Gluconate Cloth  6 each Topical Daily  . famotidine  10 mg Oral BID  . feeding supplement (ENSURE ENLIVE)  237 mL Oral TID BM  . feeding supplement (PRO-STAT SUGAR FREE 64)  30 mL Oral BID  . furosemide  10 mg Oral Daily  . mouth rinse  15 mL Mouth Rinse BID  . metoprolol tartrate  25 mg Oral BID  . sodium chloride flush  10-40 mL Intracatheter Q12H   Continuous Infusions: . sodium chloride Stopped (10/05/19 0829)     LOS: 20 days    Time spent: 30 minutes with over 50% of the time coordinating the patient's care between social work, CT surgery, vascular surgery, oncology  discussions    Harold Hedge, DO [facility] Pager [phone]  If 7PM-7AM, please contact night-coverage [email or web address] Password [facility] 10/18/2019, 5:07 PM

## 2019-10-18 NOTE — Progress Notes (Signed)
ANTICOAGULATION CONSULT NOTE - Initial Consult  Pharmacy Consult for Xarelto Indication: DVT and PE  No Known Allergies  Patient Measurements: Height: 5\' 1"  (154.9 cm) Weight: 240 lb 11.9 oz (109.2 kg) IBW/kg (Calculated) : 47.8  Vital Signs: Temp: 98.7 F (37.1 C) (01/07 1418) Temp Source: Oral (01/07 1418) BP: 124/76 (01/07 1418) Pulse Rate: 94 (01/07 0521)  Labs: Recent Labs    10/15/19 2351 10/17/19 0500 10/17/19 0752 10/17/19 0835 10/17/19 1049 10/17/19 1725 10/18/19 0346 10/18/19 0928  HGB 10.3* 9.8*  --   --   --   --  9.8*  --   HCT 31.8* 31.3*  --   --   --   --  31.2*  --   PLT 359 335  --   --   --   --  314  --   LABPROT 17.7*  --   --  17.7*  --   --  18.2*  --   INR 1.5*  --   --  1.5*  --   --  1.5*  --   HEPARINUNFRC 0.23*  --   --   --  0.43 0.36  --  0.41  CREATININE  --   --  0.65  --   --   --  0.66  --     Estimated Creatinine Clearance: 100.4 mL/min (by C-G formula based on SCr of 0.66 mg/dL).   Medical History: Past Medical History:  Diagnosis Date  . Cancer ([place])   . Chronic low back pain with left-sided sciatica   . Family history of leukemia   . Family history of stomach cancer   . Hypertension     Assessment:  Anticoag: Bilateral PE + RLE DVT, + BL UE DVT (new), SVC thrombosis. 12/22: During thrombectomy, pt developed hemopericardium/tamponade > required draining and pRBC. 12/23: Dopplers reveal R acute DVT. +cancer  - 12/18> 1/7 on Heparin - 12/29> 1/6 Coumadin INR 1.5 (9 doses Coumadin) never therapeutic - 1/7: Hgb 9.8 remains stable. Convert to Xarelto  Goal of Therapy:  Therapeutic oral anticoagulation   Plan:  D/c heparin and warfarin Xarelto 15mg  BID x 21d, then 20mg  daily   Jacque Byron S. Alford Highland, PharmD, BCPS Clinical Staff Pharmacist [email or web address] Alford Highland, [employer] 10/18/2019,5:17 PM

## 2019-10-18 NOTE — Consult Note (Signed)
[place] Nurse Consult Note: Reason for Consult:Midline surgical debridement.  S/P debridement and application of VAC on [id number] PM.  Confirmed with Tessa, PA that [facility] dressing changes can be M/W/F and we will change 10/19/19.   Wound type: surgical, infectious Pressure Injury POA: /NA Measurement:to be assessed 10/19/19 Wound bed:not assessed Drainage (amount, consistency, odor) Serosanguinous in canister Periwound:Seal in place Dressing procedure/placement/frequency:change M/W/F WOC team will follow.  Domenic Moras MSN, RN, FNP-BC CWON Wound, Ostomy, Continence Nurse Pager [phone]

## 2019-10-18 NOTE — Progress Notes (Signed)
Veronica [name] was supposed to have been discharged today but there was a problem with the wound VAC so it likely will be tomorrow except of course tomorrow is going to be a snow day.  She was supposed to see [place] tomorrow but I told her not to come.  Instead she is scheduled for visit next Tuesday, January 12.  If she is up to it she can have her first Taxol dose that day.  However she may not be quite up to it and if so I told her to omits that visit and just come back and see [place] on the following week, on 01/19.  I asked her to call us on 01/ 11 to let us know what she would prefer to do.

## 2019-10-18 NOTE — TOC Benefit Eligibility Note (Signed)
Transition of Care [facility]) Benefit Eligibility Note    Patient Details  Name: Veronica [name] MRN: [id number] Date of Birth: [birth year]   Medication/Dose: [place] Reasons  Covered?: Yes  Tier: 2 Drug     Spoke with Person/Company/Phone Number:: [employer] [phone]  Co-Pay: 20%of the total cost of the medications  Prior Approval: No     Additional Notes: patient is elegible to use of RX coupon since she has Psychologist, occupational Phone Number: 10/18/2019, 3:57 PM

## 2019-10-18 NOTE — Progress Notes (Signed)
      [address]       [place] [address]             [phone]       Spoke with Dr. Cyndia Bent regarding discharge on Xarelto either today or tomorrow after speaking with Dr. Neysa Bonito. Faythe Ghee to discharge from our perspective On Xarelto. She will need home health nursing set up to change her wound vac on Monday, Wednesday, Friday. Care management already consulted. Will arrange follow-up for her to be seen in our office 1-2 weeks.    Nicholes Rough, PA-C

## 2019-10-18 NOTE — Plan of Care (Signed)

## 2019-10-18 NOTE — Evaluation (Signed)
Physical Therapy Evaluation and Discharge Patient Details Name: Veronica [name] MRN: [id number] DOB: [birth year] Today's Date: 10/18/2019   History of Present Illness  Pt adm with facial and UE swelling. 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 Pt currently being treated for metastatic breast CA. Pt found to be covid +. Pt underwent percutaneous SVC thrombectomy on 12/22 and developed Large hemopericardium with tamponade and underwent emergent Subxyphoid pericardial window for drainage of hemopericardium. Developed wound dehiscence and to OR for I&D with wound VAC placed. PMH - metastatic breast CA with lumpectomy 04/2019, currently undergoing chemo with radiation planned, htn, low back pain  Clinical Impression   Patient evaluated by Physical Therapy with no further acute PT needs identified. All education has been completed and the patient has no further questions. She walked 300 ft with no device and no imbalance with sats >90% and only mild dyspnea (she was talking majority of the time). PT is signing off. Thank you for this referral.     Follow Up Recommendations No PT follow up    Equipment Recommendations  None recommended by PT    Recommendations for Other Services       Precautions / Restrictions Precautions Precautions: None Restrictions Weight Bearing Restrictions: No      Mobility  Bed Mobility Overal bed mobility: Independent                Transfers Overall transfer level: Modified independent Equipment used: None             General transfer comment: able to stand from chair and commode unassisted  Ambulation/Gait Ambulation/Gait assistance: Modified independent (Device/Increase time) Gait Distance (Feet): 300 Feet Assistive device: None Gait Pattern/deviations: Step-through pattern;Decreased stride length;Wide base of support   Gait velocity interpretation: 1.31 - 2.62 ft/sec, indicative of limited community ambulator General Gait  Details: Steady gait without assist  Stairs            Wheelchair Mobility    Modified Rankin (Stroke Patients Only)       Balance Overall balance assessment: Independent                                           Pertinent Vitals/Pain Pain Assessment: No/denies pain    Home Living Family/patient expects to be discharged to:: Private residence Living Arrangements: Spouse/significant other;Children Available Help at Discharge: Family Type of Home: House Home Access: Stairs to enter Entrance Stairs-Rails: Right Entrance Stairs-Number of Steps: 3 Home Layout: One level Home Equipment: None      Prior Function Level of Independence: Independent               Hand Dominance        Extremity/Trunk Assessment   Upper Extremity Assessment Upper Extremity Assessment: (+ edema bil, using functionally without difficulty)    Lower Extremity Assessment Lower Extremity Assessment: Generalized weakness       Communication   Communication: No difficulties  Cognition Arousal/Alertness: Awake/alert Behavior During Therapy: WFL for tasks assessed/performed Overall Cognitive Status: Within Functional Limits for tasks assessed                                        General Comments      Exercises Other Exercises Other Exercises: discussed continued walking and  use of IS once dc'd home. Pt normally walks with her daughter and able to recognize she will not be able to"pick up where she left off"   Assessment/Plan    PT Assessment Patent does not need any further PT services  PT Problem List         PT Treatment Interventions      PT Goals (Current goals can be found in the Care Plan section)  Acute Rehab PT Goals Patient Stated Goal: return home PT Goal Formulation: All assessment and education complete, DC therapy    Frequency     Barriers to discharge        Co-evaluation               AM-PAC PT "6  Clicks" Mobility  Outcome Measure Help needed turning from your back to your side while in a flat bed without using bedrails?: None Help needed moving from lying on your back to sitting on the side of a flat bed without using bedrails?: None Help needed moving to and from a bed to a chair (including a wheelchair)?: None Help needed standing up from a chair using your arms (e.g., wheelchair or bedside chair)?: None Help needed to walk in hospital room?: None Help needed climbing 3-5 steps with a railing? : None 6 Click Score: 24    End of Session   Activity Tolerance: Patient tolerated treatment well Patient left: with call bell/phone within reach;in bed Nurse Communication: Mobility status(no PT needs) PT Visit Diagnosis: Other abnormalities of gait and mobility (R26.89)    Time: [id number] PT Time Calculation (min) (ACUTE ONLY): 23 min   Charges:   PT Evaluation $PT Eval Low Complexity: 1 Low PT Treatments $Self Care/Home Management: 8-22         Arby Barrette, PT Pager [phone]   Rexanne Mano 10/18/2019, 4:39 PM

## 2019-10-19 ENCOUNTER — Encounter: Payer: Self-pay | Admitting: *Deleted

## 2019-10-19 ENCOUNTER — Inpatient Hospital Stay: Payer: 59 | Admitting: Oncology

## 2019-10-19 ENCOUNTER — Inpatient Hospital Stay: Payer: 59

## 2019-10-19 LAB — FERRITIN: Ferritin: 498 ng/mL — ABNORMAL HIGH (ref 11–307)

## 2019-10-19 LAB — D-DIMER, QUANTITATIVE: D-Dimer, Quant: 4.7 ug/mL-FEU — ABNORMAL HIGH (ref 0.00–0.50)

## 2019-10-19 LAB — PHOSPHORUS: Phosphorus: 4.6 mg/dL (ref 2.5–4.6)

## 2019-10-19 LAB — C-REACTIVE PROTEIN: CRP: 2.9 mg/dL — ABNORMAL HIGH (ref <1.0)

## 2019-10-19 MED ORDER — RIVAROXABAN 20 MG PO TABS: 20.0000 mg | ORAL_TABLET | Freq: Every day | ORAL | Status: DC

## 2019-10-19 MED ORDER — HYDROCODONE-ACETAMINOPHEN 7.5-325 MG PO TABS
1.0000 | ORAL_TABLET | Freq: Once | ORAL | Status: AC
  Administered 2019-10-19: 1 via ORAL
  Filled 2019-10-19: qty 1

## 2019-10-19 MED ORDER — RIVAROXABAN 15 MG PO TABS: 15.0000 mg | ORAL_TABLET | Freq: Two times a day (BID) | ORAL | Status: DC

## 2019-10-19 MED ORDER — FUROSEMIDE 20 MG PO TABS: 10.0000 mg | ORAL_TABLET | Freq: Every day | ORAL | 0 refills | Status: DC

## 2019-10-19 MED ORDER — RIVAROXABAN 20 MG PO TABS: 20.0000 mg | ORAL_TABLET | Freq: Every day | ORAL | 3 refills | Status: DC

## 2019-10-19 MED ORDER — HYDROCODONE-ACETAMINOPHEN 5-325 MG PO TABS: 1.0000 | ORAL_TABLET | ORAL | 0 refills | Status: AC | PRN

## 2019-10-19 NOTE — Consult Note (Signed)
[facility] Nurse Consult Note: Reason for Consult:Mid epigastric surgical line 2 openings.  Will bridge together Wound type:surgical Pressure Injury POA: NA Measurement: 8 cm x 5 cm x 4 cm proximal 0.3 cm x 1 cm x 1 cm distal.  Skin between two wounds is protected with drape for bridging.  Periwound skin is protected with skin prep. Some erythema and pustules noted between breasts Wound [id number] red with yellow fibrin to wound bed.  Drainage (amount, consistency, odor) minimal serosanguinous  No odor.  Periwound:See above Dressing procedure/placement/frequency:Cleanse wound with NS.  Apply black foam to both wound openings with foam between for bridge. Cover with drape. Seal at 75 mmHg obtained immediately.  Change M/W/F.  Bedside RN to change over to home unit when it arrives for discharge.  Alvis Lemmings will be assisting with [facility] care.   Will not follow at this time.  Please re-consult if needed.  Domenic Moras MSN, RN, FNP-BC CWON Wound, Ostomy, Continence Nurse Pager [phone]

## 2019-10-19 NOTE — TOC Transition Note (Signed)
Transition of Care [facility]) - CM/SW Discharge Note   Patient Details  Name: Veronica [name] MRN: [id number] Date of Birth: [birth year]  [facility] CM/SW Contact:  Maryclare Labrador, RN Phone Number: 10/19/2019, 2:45 PM   Clinical Narrative:    Pt to discharge home today.  [employer] wound vac has been applied to pt.  Pt has Xarelto copay card in her possession.  [facility] aware that pt will discharge home today.  No other CM needs determined - CM signing off         Patient Goals and CMS Choice   CMS Medicare.gov Compare Post Acute Care list provided to:: Patient Choice offered to / list presented to : Patient  Discharge Placement                       Discharge Plan and Services     Post Acute Care Choice: Home Health          DME Arranged: Negative pressure wound device DME Agency: [employer] Date DME Agency Contacted: 10/18/19 Time DME Agency Contacted: [id number] Representative spoke with at DME Agency: Leontine Locket HH Arranged: RN [place] Agency: [place] Date [place]: 10/18/19 Time [place]: [place] Representative spoke with at [place]: [place] ([place]) Interventions     Readmission Risk Interventions Readmission Risk Prevention Plan 10/18/2019  Transportation Screening Complete  Medication Review [employer]) Complete  PCP or Specialist appointment within 3-5 days of discharge (No Data)  [place] or [place] Not Applicable  Some recent data might be hidden

## 2019-10-19 NOTE — Plan of Care (Signed)
  Problem: Pain Managment: Goal: General experience of comfort will improve Outcome: Progressing   Problem: Safety: Goal: Ability to remain free from injury will improve Outcome: Progressing   Problem: Skin Integrity: Goal: Risk for impaired skin integrity will decrease Outcome: Progressing   Problem: Education: Goal: Knowledge of risk factors and measures for prevention of condition will improve Outcome: Progressing

## 2019-10-19 NOTE — Progress Notes (Signed)
Discharge instructions gone over with patient, wound nurse in to assist with new wound vac. All questions answered. Xarelto information given by pharmacist. IV removed. No other concerns.

## 2019-10-19 NOTE — Discharge Summary (Addendum)
Physician Discharge Summary  Veronica [name] [id number] DOB: [birth year] DOA: 09/28/2019  PCP: Blair Heys, PA-C  Admit date: 09/28/2019 Discharge date: 10/19/2019   Code Status: Full Code  Admitted From: Home Discharged to: Home Home health: RN for wound VAC changes Monday, Wednesday, Friday Equipment/Devices: Wound VAC Discharge Condition: Stable  Recommendations for Outpatient Follow-up   1. Patient taking Xarelto for multiple VTE, monitor for bleed/recurrent tamponade. 2. To follow-up outpatient with vascular surgery in 4 weeks with bilateral upper extremity venous duplex 3. To follow-up cardiothoracic surgery in 1 to 2 weeks 4. To have wound VAC changes Monday, Wednesday, Friday, discharged with Norco post changes 5. To follow-up with oncology next week either 1/12 or 1/19  6. Per vascular surgery: Upper extremity compression garments after discharge, to be obtained outpatient  Hospital Summary  This is a 49 year old female with history of breast cancer, presented with SVC syndrome and PE.  She was undergoing a cutaneous SVC thrombectomy by Dr. Trula Slade on 10/02/2019 and developed progressive hypotension and tachycardia, TEE showed development of pericardial effusion, underwent emergent subxiphoid pericardial window with drainage of 900 cc venous blood with no further bleeding after reversal of heparin.  Pericardial drainage tube placed.  Has since developed dehiscence of wound and has been evaluated by CT surgery and underwent debridement of wound and wound VAC placement on 1/5 by Dr. Cyndia Bent and restarted on heparin bridge and Coumadin.  After further discussion with patient's hematologist/oncologist as well as cardiothoracic surgeon, patient was okay to change to Xarelto for VTE treatment.  Patient also was found to be COVID-19 positive on 12/18 and completed 5 days remdesivir treatment as she was requiring O2 via 2 L nasal cannula.  She was weaned off O2 on 12/26 and tolerated room  air since.    A & P   Principal Problem:   Multiple subsegmental pulmonary emboli without acute cor pulmonale (HCC) Active Problems:   Malignant neoplasm of upper-outer quadrant of right breast in female, estrogen receptor positive (HCC)   Thrombocytopenia (HCC)   Superior vena cava thrombosis (HCC)   SVCO (superior vena cava obstruction)   SVC syndrome   Sinus tachycardia   Essential hypertension   Gastroesophageal reflux disease   Pleural effusion   1. Acute bilateral PE with acute SVC syndrome status post mechanical thrombectomy complicated by hemopericardium and tamponade status post xiphoid window/RLE DVT a. Pericardial drain removed by CT surgery 12/27 b. Echo 10/15/2019 without evidence of pericardial effusion, EF 60 to 65% c. Received Keflex for cellulitis at incision site 1/3 d. Improved bilateral upper extremity symptoms e. Per vascular surgery: Upper extremity compression garments after discharge to be obtained outpatient f. Xarelto starter pack at discharge with refills.  Given her breast cancer diagnosis she may need indefinite anticoagulation.  To be managed outpatient g. Wound VAC to be changed Monday Wednesday Friday  h. will need to follow-up in CT surgery and vascular office as outpatient i. will continue low-dose Lasix at discharge and follow-up labs in 1 week j. Norco post wound VAC changes as needed for pain 2. Exudative pleural effusion status post thoracentesis 10/15/2019, unknown etiology likely from above a. Pleural fluid LD 95 elevated  b. Stable, on room air and no need for repeat chest x-ray at this time 3. Right lower extremity DVT a. Anticoagulation as above b. No further recommendations per PT/OT 4. COVID-19 a. Satting well on room air, status post full course of remdesivir 5. Chemo induced pancytopenia in setting of breast cancer a.  Pancytopenia resolved 6. Breast cancer a. Oncology was consulted previously, follows with Dr. Jana Hakim  outpatient b. Follow-up outpatient next week 7. GERD a. Pepcid  Consultants  . Oncology . Cardiothoracic surgery . Vascular surgery  Procedures  ETT 12/22 >12/23 L radial art line 12/22 > Right femoral CVL >> 12/25 Thoracentesis 1/4 Pericardial drain removal 12/27 Wound debridement and wound VAC placement 1/5   Antibiotics   Anti-infectives (From admission, onward)   Start     Dose/Rate Route Frequency Ordered Stop   10/07/19 1400  cephALEXin (KEFLEX) capsule 500 mg     500 mg Oral Every 8 hours 10/07/19 1214 10/14/19 2359   10/02/19 1000  remdesivir 100 mg in sodium chloride 0.9 % 100 mL IVPB     100 mg 200 mL/hr over 30 Minutes Intravenous Daily 10/01/19 1121 10/05/19 1042   10/01/19 1200  ciprofloxacin (CIPRO) IVPB 400 mg  Status:  Discontinued     400 mg 200 mL/hr over 60 Minutes Intravenous Every 12 hours 10/01/19 1113 10/03/19 2154   10/01/19 1130  remdesivir 200 mg in sodium chloride 0.9% 250 mL IVPB     200 mg 580 mL/hr over 30 Minutes Intravenous Once 10/01/19 1121 10/01/19 1301        Subjective  Seen and examined at bedside no acute distress resting comfortably.  Denies any complaints of chest pain or shortness of breath or palpitations, no nausea or vomiting.  Admits that her extremity swelling has improved but not resolved. Objective   Discharge Exam: Vitals:   10/18/19 2220 10/19/19 0454  BP: 111/69 116/76  Pulse:  87  Resp:  18  Temp:  98.4 F (36.9 C)  SpO2: 96% 96%   Vitals:   10/18/19 0833 10/18/19 1418 10/18/19 2220 10/19/19 0454  BP: (!) 141/99 124/76 111/69 116/76  Pulse:    87  Resp:  18  18  Temp:  98.7 F (37.1 C)  98.4 F (36.9 C)  TempSrc:  Oral  Oral  SpO2: 94%  96% 96%  Weight:    105.3 kg  Height:        Physical Exam Vitals and nursing note reviewed.  Constitutional:      General: She is not in acute distress. HENT:     Head: Normocephalic.     Mouth/Throat:     Mouth: Mucous membranes are moist.  Eyes:      Conjunctiva/sclera: Conjunctivae normal.  Cardiovascular:     Rate and Rhythm: Normal rate.     Pulses: Normal pulses.  Pulmonary:     Effort: Pulmonary effort is normal.     Breath sounds: Normal breath sounds.  Abdominal:     General: Abdomen is flat. There is no distension.  Musculoskeletal:        General: No deformity.     Comments: Bilateral upper extremity swelling and right lower extremity swelling without tenderness  Skin:    General: Skin is warm.     Coloration: Skin is not jaundiced.  Neurological:     Mental Status: She is alert. Mental status is at baseline.  Psychiatric:        Mood and Affect: Mood normal.        Behavior: Behavior normal.       The results of significant diagnostics from this hospitalization (including imaging, microbiology, ancillary and laboratory) are listed below for reference.     Microbiology: Recent Results (from the past 240 hour(s))  Body fluid culture (includes gram stain)  Status: None   Collection Time: 10/15/19  3:30 PM   Specimen: Pleural Fluid  Result Value Ref Range Status   Specimen Description PLEURAL  Final   Special Requests NONE  Final   Gram Stain   Final    RARE WBC PRESENT, PREDOMINANTLY MONONUCLEAR NO ORGANISMS SEEN    Culture   Final    NO GROWTH 3 DAYS Performed at [facility], [address] [place], [place] [address]    Report Status 10/18/2019 FINAL  Final     Labs: BNP (last 3 results) Recent Labs    09/01/19 1931 09/05/19 1030 09/28/19 1652  BNP 222.0* 60.3 [id number]   Basic Metabolic Panel: Recent Labs  Lab 10/13/19 0500 10/14/19 1220 10/15/19 0738 10/15/19 2351 10/17/19 0752 10/17/19 0835 10/18/19 0346 10/19/19 0407  NA 137 140  --   --  141  --  141  --   K 3.7 3.5  --   --  3.8  --  3.6  --   CL 101 105  --   --  110  --  107  --   CO2 22 24  --   --  23  --  22  --   GLUCOSE 150* 159*  --   --  101*  --  109*  --   BUN 7 6  --   --  <5*  --  5*  --   CREATININE 0.62  0.72  --   --  0.65  --  0.66  --   CALCIUM 8.9 8.9  --   --  8.7*  --  8.8*  --   MG 2.0 2.0  --   --   --   --   --   --   PHOS 4.9* 4.1 4.9* 4.0  --  4.9* 4.4 4.6   Liver Function Tests: Recent Labs  Lab 10/13/19 0500 10/14/19 1220  AST 23 27  ALT 34 32  ALKPHOS 103 98  BILITOT 1.1 1.1  PROT 5.1* 5.8*  ALBUMIN 2.6* 2.8*   No results for input(s): LIPASE, AMYLASE in the last 168 hours. No results for input(s): AMMONIA in the last 168 hours. CBC: Recent Labs  Lab 10/13/19 0500 10/14/19 1220 10/15/19 0738 10/15/19 2351 10/17/19 0500 10/18/19 0346  WBC 10.9* 12.5* 7.7 10.1 7.2 7.2  NEUTROABS 7.4 10.9*  --   --   --   --   HGB 9.8* 11.1* 9.8* 10.3* 9.8* 9.8*  HCT 30.1* 33.8* 31.1* 31.8* 31.3* 31.2*  MCV 91.2 91.4 93.1 91.1 94.3 93.4  PLT 296 375 324 359 335 314   Cardiac Enzymes: No results for input(s): CKTOTAL, CKMB, CKMBINDEX, TROPONINI in the last 168 hours. BNP: Invalid input(s): POCBNP CBG: No results for input(s): GLUCAP in the last 168 hours. D-Dimer Recent Labs    10/18/19 0346 10/19/19 0407  DDIMER 3.38* 4.70*   Hgb A1c No results for input(s): HGBA1C in the last 72 hours. Lipid Profile No results for input(s): CHOL, HDL, LDLCALC, TRIG, CHOLHDL, LDLDIRECT in the last 72 hours. Thyroid function studies No results for input(s): TSH, T4TOTAL, T3FREE, THYROIDAB in the last 72 hours.  Invalid input(s): FREET3 Anemia work up Recent Labs    10/18/19 0346 10/19/19 0407  FERRITIN 550* 498*   Urinalysis    Component Value Date/Time   COLORURINE YELLOW 08/28/2019 2326   APPEARANCEUR CLOUDY (A) 08/28/2019 2326   LABSPEC 1.013 08/28/2019 2326   PHURINE 5.0 08/28/2019 2326  GLUCOSEU NEGATIVE 08/28/2019 2326   HGBUR MODERATE (A) 08/28/2019 2326   BILIRUBINUR NEGATIVE 08/28/2019 2326   KETONESUR NEGATIVE 08/28/2019 2326   PROTEINUR NEGATIVE 08/28/2019 2326   NITRITE NEGATIVE 08/28/2019 2326   LEUKOCYTESUR MODERATE (A) 08/28/2019 2326   Sepsis  Labs Invalid input(s): PROCALCITONIN,  WBC,  LACTICIDVEN Microbiology Recent Results (from the past 240 hour(s))  Body fluid culture (includes gram stain)     Status: None   Collection Time: 10/15/19  3:30 PM   Specimen: Pleural Fluid  Result Value Ref Range Status   Specimen Description PLEURAL  Final   Special Requests NONE  Final   Gram Stain   Final    RARE WBC PRESENT, PREDOMINANTLY MONONUCLEAR NO ORGANISMS SEEN    Culture   Final    NO GROWTH 3 DAYS Performed at [facility], [place] [address], [place], [place] [address]    Report Status 10/18/2019 FINAL  Final    Discharge Instructions     Discharge Instructions    Diet - low sodium heart healthy   Complete by: As directed    Discharge instructions   Complete by: As directed    You were seen and examined in the hospital for multiple blood clots and found diagnosed with COVID 19 on 12/18 and cared for by a hospitalist, vascular surgeon, and cardiothoracic surgeon  Upon Discharge:  -Take Xarelto as prescribed daily, monitor closely for blood in stool and be very careful not to fall as this medication can increase your risk of bleeding -Take metoprolol 25 mg twice daily -Follow up with your vascular surgeon, cardiothoracic surgeon and oncologist in the coming weeks -Get lab work in one week -A nurse is going to come to your house every Monday, Wednesday, and Friday to change your wound vac starting next week  Bring all home medications to your appointment to review Request that your primary physician go over all hospital tests and procedures/radiological results at the follow up.   Please get all hospital records sent to your physician by signing a hospital release before you go home.     Read the complete instructions along with all the possible side effects for all the medicines you take and that have been prescribed to you. Take any new medicines after you have completely understood and accept all the possible  adverse reactions/side effects.   If you have any questions about your discharge medications or the care you received while you were in the hospital, you can call the unit and asked to speak with the hospitalist on call. Once you are discharged, your primary care physician will handle any further medical issues. Please note that NO REFILLS for any discharge medications will be authorized, as it is imperative that you return to your primary care physician (or establish a relationship with a primary care physician if you do not have one) for your aftercare needs so that they can reassess your need for medications and monitor your lab values.   Do not drive, operate heavy machinery, perform activities at heights, swimming or participation in water activities or provide baby sitting services if your were admitted for loss of consciousness/seizures or if you are on sedating medications including, but not limited to benzodiazepines, sleep medications, narcotic pain medications, etc., until you have been cleared to do so by a medical doctor.   Do not take more than prescribed medications.   Wear a seat belt while driving.  If you have smoked or chewed Tobacco in  the last 2 years please stop smoking; also stop any regular Alcohol and/or any Recreational drug use including marijuana.  If you experience worsening of your admission symptoms or develop shortness of breath, chest pain, suicidal or homicidal thoughts or experience a life threatening emergency, you must seek medical attention immediately by calling 911 or calling your PCP immediately.   Increase activity slowly   Complete by: As directed    MyChart COVID-19 home monitoring program   Complete by: Oct 19, 2019    Is the patient willing to use the [place] for home monitoring?: Yes   Temperature monitoring   Complete by: Oct 19, 2019    After how many days would you like to receive a notification of this patient's flowsheet entries?: 1      Allergies as of 10/19/2019   No Known Allergies     Medication List    STOP taking these medications   olmesartan-hydrochlorothiazide 20-12.5 MG tablet Commonly known as: BENICAR HCT   ondansetron 8 MG tablet Commonly known as: ZOFRAN   potassium chloride SA 20 MEQ tablet Commonly known as: KLOR-CON     TAKE these medications   acetaminophen 500 MG tablet Commonly known as: TYLENOL Take 500 mg by mouth every 6 (six) hours as needed for mild pain.   dexamethasone 4 MG tablet Commonly known as: DECADRON TAKE TWO TABLETS BY MOUTH ONCE A DAY ON THE DAY AFTER CHEMOTHERAPY AND THEN TAKE TWO TABLETS BY MOUTH TWICE DAILY FOR 2 DAYS What changed:   how much to take  how to take this  when to take this  additional instructions   famotidine 10 MG tablet Commonly known as: PEPCID Take 10 mg by mouth 2 (two) times daily.   furosemide 20 MG tablet Commonly known as: LASIX Take 0.5 tablets (10 mg total) by mouth daily. What changed: how much to take   HYDROcodone-acetaminophen 5-325 MG tablet Commonly known as: Norco Take 1 tablet by mouth as needed for up to 6 days for moderate pain or severe pain (after wound vac changes, Monday/Wednesday/Friday).   lidocaine-prilocaine cream Commonly known as: EMLA Apply to affected area once   loratadine 10 MG tablet Commonly known as: CLARITIN Take 10 mg by mouth See admin instructions. Takes for 5 days after chemo   LORazepam 0.5 MG tablet Commonly known as: Ativan Take 1 tablet (0.5 mg total) by mouth at bedtime as needed (Nausea or vomiting).   metoprolol tartrate 25 MG tablet Commonly known as: LOPRESSOR Take 1 tablet (25 mg total) by mouth 2 (two) times daily.   prochlorperazine 10 MG tablet Commonly known as: COMPAZINE Take 1 tablet (10 mg total) by mouth every 6 (six) hours as needed (Nausea or vomiting).   Rivaroxaban 15 & 20 MG Tbpk Follow package directions: Take one 15mg  tablet by mouth twice a day. On day  22, switch to one 20mg  tablet once a day. Take with food.   rivaroxaban 20 MG Tabs tablet Commonly known as: Xarelto Take 1 tablet (20 mg total) by mouth daily with supper. Start taking on: November 19, 2019   Vitamin D3 25 MCG (1000 UT) Diona Fanti 1 tablet by mouth daily.            Durable Medical Equipment  (From admission, onward)         Start     Ordered   10/18/19 1331  For home use only DME Negative pressure wound device  Once    Question Answer  Comment  Frequency of dressing change 3 times per week   Length of need 3 Months   Dressing type Foam   Amount of suction 75 mm/Hg   Pressure application Continuous pressure   Supplies 10 canisters and 15 dressings per month for duration of therapy      10/18/19 1331         Follow-up Information    Serafina Mitchell, MD In 4 weeks.   Specialties: Vascular Surgery, Cardiology Why: Office will call you to arrange your appt (sent) Contact information: [place] [address] [place], [facility] Follow up.   Specialty: Home Health Services Why: Home Health Contact information: [address] [place] [address] [phone]        Gaye Pollack, MD Follow up.   Specialty: Cardiothoracic Surgery Why: Your routine follow-up appointment is on 11/07/2019 at 3:00pm. Please bring your hospital paperwork.  Contact information: [address] [address] [phone]          No Known Allergies  Time coordinating discharge: Over 30 minutes   SIGNED:   Harold Hedge, D.O. [facility] Pager: [phone]  10/19/2019, 3:08 PM

## 2019-10-23 ENCOUNTER — Encounter: Payer: Self-pay | Admitting: Adult Health

## 2019-10-23 ENCOUNTER — Other Ambulatory Visit: Payer: Self-pay

## 2019-10-23 ENCOUNTER — Inpatient Hospital Stay: Payer: 59

## 2019-10-23 ENCOUNTER — Inpatient Hospital Stay: Payer: 59 | Attending: Oncology

## 2019-10-23 ENCOUNTER — Inpatient Hospital Stay (HOSPITAL_BASED_OUTPATIENT_CLINIC_OR_DEPARTMENT_OTHER): Payer: 59 | Admitting: Adult Health

## 2019-10-23 VITALS — BP 104/56 | HR 91 | Temp 98.5°F | Resp 20 | Ht 61.0 in | Wt 217.4 lb

## 2019-10-23 DIAGNOSIS — Z170 Estrogen receptor positive status [ER+]: Secondary | ICD-10-CM

## 2019-10-23 DIAGNOSIS — C50411 Malignant neoplasm of upper-outer quadrant of right female breast: Secondary | ICD-10-CM | POA: Diagnosis not present

## 2019-10-23 LAB — CBC WITH DIFFERENTIAL/PLATELET
Abs Immature Granulocytes: 0.09 10*3/uL — ABNORMAL HIGH (ref 0.00–0.07)
Basophils Absolute: 0.1 10*3/uL (ref 0.0–0.1)
Basophils Relative: 1 %
Eosinophils Absolute: 0.2 10*3/uL (ref 0.0–0.5)
Eosinophils Relative: 3 %
HCT: 38.4 % (ref 36.0–46.0)
Hemoglobin: 12.3 g/dL (ref 12.0–15.0)
Immature Granulocytes: 1 %
Lymphocytes Relative: 11 %
Lymphs Abs: 0.8 10*3/uL (ref 0.7–4.0)
MCH: 29 pg (ref 26.0–34.0)
MCHC: 32 g/dL (ref 30.0–36.0)
MCV: 90.6 fL (ref 80.0–100.0)
Monocytes Absolute: 0.6 10*3/uL (ref 0.1–1.0)
Monocytes Relative: 9 %
Neutro Abs: 5.3 10*3/uL (ref 1.7–7.7)
Neutrophils Relative %: 75 %
Platelets: 316 10*3/uL (ref 150–400)
RBC: 4.24 MIL/uL (ref 3.87–5.11)
RDW: 15.4 % (ref 11.5–15.5)
WBC: 7.1 10*3/uL (ref 4.0–10.5)
nRBC: 0 % (ref 0.0–0.2)

## 2019-10-23 LAB — COMPREHENSIVE METABOLIC PANEL
ALT: 27 U/L (ref 0–44)
AST: 27 U/L (ref 15–41)
Albumin: 3.5 g/dL (ref 3.5–5.0)
Alkaline Phosphatase: 97 U/L (ref 38–126)
Anion gap: 14 (ref 5–15)
BUN: 8 mg/dL (ref 6–20)
CO2: 22 mmol/L (ref 22–32)
Calcium: 9.4 mg/dL (ref 8.9–10.3)
Chloride: 107 mmol/L (ref 98–111)
Creatinine, Ser: 0.78 mg/dL (ref 0.44–1.00)
GFR calc Af Amer: >60 mL/min (ref >60)
GFR calc non Af Amer: >60 mL/min (ref >60)
Glucose, Bld: 127 mg/dL — ABNORMAL HIGH (ref 70–99)
Potassium: 3.9 mmol/L (ref 3.5–5.1)
Sodium: 143 mmol/L (ref 135–145)
Total Bilirubin: 0.8 mg/dL (ref 0.3–1.2)
Total Protein: 6.8 g/dL (ref 6.5–8.1)

## 2019-10-23 LAB — PROTIME-INR
INR: 2.2 — ABNORMAL HIGH (ref 0.8–1.2)
Prothrombin Time: 24.3 seconds — ABNORMAL HIGH (ref 11.4–15.2)

## 2019-10-23 MED ORDER — FAMOTIDINE IN NACL 20-0.9 MG/50ML-% IV SOLN
INTRAVENOUS | Status: AC
  Filled 2019-10-23: qty 50

## 2019-10-23 MED ORDER — DIPHENHYDRAMINE HCL 50 MG/ML IJ SOLN
INTRAMUSCULAR | Status: AC
  Filled 2019-10-23: qty 1

## 2019-10-23 NOTE — Progress Notes (Addendum)
[place]  Telephone:([phone] Fax:(336) [phone]     ID: Veronica [name] DOB: [birth year]  MR#: [id number]  ZJQ#:[id number]  Patient Care Team: Blair Heys, Hershal Coria as PCP - General (Physician Assistant) Mauro Kaufmann, RN as Oncology Nurse Navigator [name], RN as Oncology Nurse Navigator Erroll Luna, MD as Consulting Physician (General Surgery) Magrinat, Virgie Dad, MD as Consulting Physician (Oncology) Kyung Rudd, MD as Consulting Physician (Radiation Oncology) Chauncey Cruel, MD OTHER MD:  CHIEF COMPLAINT: Estrogen receptor positive breast cancer  CURRENT TREATMENT:  adjuvant chemotherapy   INTERVAL HISTORY: Veronica [name] returns today for follow-up and treatment of her estrogen receptor positive breast cancer.   She last received her fourth cycle of adjuvant chemotherapy on 09/24/2019.  She was hospitalized from 09/28/2019 for SVC syndrome, extensive VTE, pulmonary embolus.  Her hospital course was complicated by a pericardial effusion/tamponade that occurred during thrombectomy and subsequently underwent a pericardial window.  Her wound dehisced and she now has a wound vac that home health is taking care of.  She was discharged on 10/19/2019.  She is feeling improved, and is happy to be out of the hospital.    REVIEW OF SYSTEMS: Veronica [name] notes that she is regaining her strength.  She has less facial swelling, and isn't feeling as short of breath.  She is without any fever or chills.  She denies nausea, vomiting, bowel/bladder changes.  She is without chestpain, palpitations, cough, or shortness of breath.  She has no skin rash or lesion.  She is without headaches,visionchanges. A detailed ROS was otherwise non contributory.   HISTORY OF CURRENT ILLNESS: From the original intake note:  Veronica [name] had routine screening mammography on 04/27/2019 showing a possible abnormality in the right breast. She underwent right diagnostic mammography with tomography and  right breast ultrasonography at [facility] on 05/01/2019 showing: breast density category C; highly suspicious 1.7 cm irregular mass in the right breast at 10 o'clock, 8 cm from the nipple; indeterminate hypoechoic round mass in the right axillary tail/low axilla. Physical exam showed no suspicious lumps.  Accordingly on 05/02/2019 she proceeded to biopsy of the right breast area in question. The pathology from this procedure ([id number]) showed: invasive ductal carcinoma, grade 3; ductal carcinoma in situ; lymphovascular invasion present. Prognostic indicators significant for: estrogen receptor, 30% positive with moderate staining intensity, and progesterone receptor, 30% positive with strong staining intensity. Proliferation marker Ki67 at 20%. HER2 negative by immunohistochemistry (1+).  The second "satellite" lesion was a lymph node which was also positive.  The patient's subsequent history is as detailed below.   PAST MEDICAL HISTORY: Past Medical History:  Diagnosis Date  . Cancer ([place])   . Chronic low back pain with left-sided sciatica   . Family history of leukemia   . Family history of stomach cancer   . Hypertension     PAST SURGICAL HISTORY: Past Surgical History:  Procedure Laterality Date  . APPLICATION OF WOUND VAC N/A 10/16/2019   Procedure: APPLICATION OF WOUND VAC;  Surgeon: Gaye Pollack, MD;  Location: [facility];  Service: Vascular;  Laterality: N/A;  . BREAST BIOPSY Right 05/02/2019   right clips X2  . BREAST LUMPECTOMY WITH RADIOACTIVE SEED AND SENTINEL LYMPH NODE BIOPSY Right 06/12/2019   Procedure: RIGHT BREAST RADIOACTIVE SEED LUMPECTOMY  AND RIGHT AXILLARY SEED TARGETED LYMPH NODE AND AXILLARY SENTINEL LYMPH NODE MAPPING;  Surgeon: Erroll Luna, MD;  Location: [facility];  Service: General;  Laterality: Right;  PEC  BLOCK  . C/S x2  '98 '03  . CENTRAL VENOUS CATHETER INSERTION  10/02/2019   Procedure: Insertion Central Line Adult;  Surgeon:  Serafina Mitchell, MD;  Location: [facility];  Service: Vascular;;  . CESAREAN SECTION    . CHOLECYSTECTOMY    . I & D EXTREMITY N/A 10/16/2019   Procedure: DEBRIDEMENT of DEHISCED MIDLINE SURGICAL INCISION;  Surgeon: Gaye Pollack, MD;  Location: [facility];  Service: Vascular;  Laterality: N/A;  . LAPAROSCOPIC ASSISTED VAGINAL HYSTERECTOMY  05/17/2011   Procedure: LAPAROSCOPIC ASSISTED VAGINAL HYSTERECTOMY;  Surgeon: Sharene Butters;  Location: [facility];  Service: Gynecology;  Laterality: N/A;  Laparoscopic Assisted Vaginal Hysterectomy With Lysis Of Adhesions  . PERICARDIAL WINDOW N/A 10/02/2019   Procedure: Pericardial Window;  Surgeon: Serafina Mitchell, MD;  Location: [facility];  Service: Vascular;  Laterality: N/A;  . PORT-A-CATH REMOVAL  10/02/2019   Procedure: Removal Port-A-Cath;  Surgeon: Serafina Mitchell, MD;  Location: [facility];  Service: Vascular;;  . PORTACATH PLACEMENT Right 06/12/2019   Procedure: INSERTION PORT-A-CATH WITH ULTRASOUND;  Surgeon: Erroll Luna, MD;  Location: [facility];  Service: General;  Laterality: Right;  . RE-EXCISION OF BREAST LUMPECTOMY Right 07/17/2019   Procedure: RE-EXCISION OF RIGHT BREAST LUMPECTOMY;  Surgeon: Erroll Luna, MD;  Location: [facility];  Service: General;  Laterality: Right;  . TUBAL LIGATION    . ULTRASOUND GUIDANCE FOR VASCULAR ACCESS  10/02/2019   Procedure: Ultrasound Guidance For Vascular Access;  Surgeon: Serafina Mitchell, MD;  Location: [facility];  Service: Vascular;;  . VENOGRAM N/A 10/02/2019   Procedure: Central VENOGRAM;  Surgeon: Serafina Mitchell, MD;  Location: [facility] OR;  Service: Vascular;  Laterality: N/A;    FAMILY HISTORY: Family History  Problem Relation Age of Onset  . Arthritis Mother   . COPD Mother   . Hypertension Mother   . Hyperlipidemia Mother   . Leukemia Brother 10  . Stomach cancer Maternal Grandmother        late 48s   Patient's parents are living (as of September 2020). Her father is 62 and  her mother is 38 as of 04/2019. The patient denies a family hx of breast or ovarian cancer. She has 7 siblings, 6 brothers and 1 sister. She reports her maternal grandmother was diagnosed with stomach cancer at age 2. Her brother was diagnosed with leukemia at age 1.   GYNECOLOGIC HISTORY:  No LMP recorded. Patient has had a hysterectomy. Menarche: 49 years old Age at first live birth: 49 years old GX P 2 (+1 stillborn) LMP age 12-42 Contraceptive: unsure, maybe for a year at age 7. HRT no Hysterectomy? Yes, 05/2011 BSO? 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 She is married. Husband Sheppard Plumber") is a Hydrologist for a telephone company. She lives at home with her husband and two daughters. Daughter Suszanne Conners, age 60, is a Scientist, water quality. Daughter Veronica [name], age 5, is a Ship broker.     ADVANCED DIRECTIVES: Husband Veronica [name] is her HCPOA.   HEALTH MAINTENANCE: Social History   Tobacco Use  . Smoking status: Never Smoker  . Smokeless tobacco: Never Used  Substance Use Topics  . Alcohol use: Yes    Alcohol/week: 2.0 standard drinks    Types: 2 Standard drinks or equivalent per week    Comment: "2-3 a week"  . Drug use: No     Colonoscopy: n/a  PAP: 02/2018  Bone density: n/a  No Known Allergies  Current Outpatient Medications  Medication Sig Dispense Refill  . acetaminophen (TYLENOL) 500 MG tablet Take 500 mg by mouth every 6 (six) hours as needed for mild pain.     . Cholecalciferol (VITAMIN D3) 25 MCG (1000 UT) CHEW Chew 1 tablet by mouth daily.     . famotidine (PEPCID) 10 MG tablet Take 10 mg by mouth 2 (two) times daily.    . furosemide (LASIX) 20 MG tablet Take 0.5 tablets (10 mg total) by mouth daily. 30 tablet 0  . loratadine (CLARITIN) 10 MG tablet Take 10 mg by mouth See admin instructions. Takes for 5 days after chemo    . metoprolol tartrate (LOPRESSOR) 25 MG tablet Take 1 tablet (25 mg total) by mouth 2 (two) times  daily. 60 tablet 0  . [START ON 11/19/2019] rivaroxaban (XARELTO) 20 MG TABS tablet Take 1 tablet (20 mg total) by mouth daily with supper. 30 tablet 3  . Rivaroxaban 15 & 20 MG TBPK Follow package directions: Take one 69m tablet by mouth twice a day. On day 22, switch to one 291mtablet once a day. Take with food. 51 each 0   No current facility-administered medications for this visit.    OBJECTIVE:   Vitals:   10/23/19 1331  BP: (!) 104/56  Pulse: 91  Resp: 20  Temp: 98.5 F (36.9 C)  SpO2: 100%   Wt Readings from Last 3 Encounters:  10/23/19 217 lb 6.4 oz (98.6 kg)  10/19/19 232 lb 2.3 oz (105.3 kg)  09/28/19 217 lb 8 oz (98.7 kg)   Body mass index is 41.08 kg/m.    ECOG FS:1 - Symptomatic but completely ambulatory GENERAL: Patient is a well appearing female in no acute distress HEENT:  Sclerae anicteric.  Mask in Place. Neck is supple.  NODES:  No cervical, supraclavicular, or axillary lymphadenopathy palpated.  BREAST EXAM:  Deferred.  Wound vac noted over central chest just below sternum, area underneath clear dressing is erythematous, appears irritated LUNGS:  Clear to auscultation bilaterally.  No wheezes or rhonchi. HEART:  Regular rate and rhythm. No murmur appreciated. ABDOMEN:  Soft, nontender.  Positive, normoactive bowel sounds. No organomegaly palpated. MSK:  No focal spinal tenderness to palpation. EXTREMITIES:  No peripheral edema.   SKIN:  Clear with no obvious rashes or skin changes. No nail dyscrasia. NEURO:  Nonfocal. Well oriented.  Appropriate affect.     LAB RESULTS:  CMP     Component Value Date/Time   NA 143 10/23/2019 1305   K 3.9 10/23/2019 1305   CL 107 10/23/2019 1305   CO2 22 10/23/2019 1305   GLUCOSE 127 (H) 10/23/2019 1305   BUN 8 10/23/2019 1305   CREATININE 0.78 10/23/2019 1305   CREATININE 0.71 09/05/2019 1030   CALCIUM 9.4 10/23/2019 1305   PROT 6.8 10/23/2019 1305   ALBUMIN 3.5 10/23/2019 1305   AST 27 10/23/2019 1305    AST 18 05/09/2019 1225   ALT 27 10/23/2019 1305   ALT 23 05/09/2019 1225   ALKPHOS 97 10/23/2019 1305   BILITOT 0.8 10/23/2019 1305   BILITOT 0.7 05/09/2019 1225   GFRNONAA >60 10/23/2019 1305   GFRNONAA >60 09/05/2019 1030   GFRAA >60 10/23/2019 1305   GFRAA >60 09/05/2019 1030    No results found for: TOTALPROTELP, ALBUMINELP, A1GS, A2GS, BETS, BETA2SER, GAMS, MSPIKE, SPEI  No results found for: KPAFRELGTCHN, LAMBDASER, KAPLAMBRATIO  Lab Results  Component Value Date   WBC 7.1 10/23/2019  NEUTROABS 5.3 10/23/2019   HGB 12.3 10/23/2019   HCT 38.4 10/23/2019   MCV 90.6 10/23/2019   PLT 316 10/23/2019    '@LASTCHEMISTRY' @  No results found for: LABCA2  No components found for: [id number]  Recent Labs  Lab 10/23/19 1305  INR 2.2*    No results found for: LABCA2  No results found for: RSW546  No results found for: EVO350  No results found for: KXF818  No results found for: CA2729  No components found for: HGQUANT  No results found for: CEA1 / No results found for: CEA1   No results found for: AFPTUMOR  No results found for: CHROMOGRNA  No results found for: PSA1  Appointment on 10/23/2019  Component Date Value Ref Range Status  . Prothrombin Time 10/23/2019 24.3* 11.4 - 15.2 seconds Final  . INR 10/23/2019 2.2* 0.8 - 1.2 Final   Comment: (NOTE) INR goal varies based on device and disease states. Performed at [facility], [place] [address], [place], [place] [address]   . Sodium 10/23/2019 143  135 - 145 mmol/L Final  . Potassium 10/23/2019 3.9  3.5 - 5.1 mmol/L Final  . Chloride 10/23/2019 107  98 - 111 mmol/L Final  . CO2 10/23/2019 22  22 - 32 mmol/L Final  . Glucose, Bld 10/23/2019 127* 70 - 99 mg/dL Final  . BUN 10/23/2019 8  6 - 20 mg/dL Final  . Creatinine, Ser 10/23/2019 0.78  0.44 - 1.00 mg/dL Final  . Calcium 10/23/2019 9.4  8.9 - 10.3 mg/dL Final  . Total Protein 10/23/2019 6.8  6.5 - 8.1 g/dL Final  . Albumin 10/23/2019  3.5  3.5 - 5.0 g/dL Final  . AST 10/23/2019 27  15 - 41 U/L Final  . ALT 10/23/2019 27  0 - 44 U/L Final  . Alkaline Phosphatase 10/23/2019 97  38 - 126 U/L Final  . Total Bilirubin 10/23/2019 0.8  0.3 - 1.2 mg/dL Final  . GFR calc non Af Amer 10/23/2019 >60  >60 mL/min Final  . GFR calc Af Amer 10/23/2019 >60  >60 mL/min Final  . Anion gap 10/23/2019 14  5 - 15 Final   Performed at [facility], [place] [address], [place], [place] [address]  . WBC 10/23/2019 7.1  4.0 - 10.5 K/uL Final  . RBC 10/23/2019 4.24  3.87 - 5.11 MIL/uL Final  . Hemoglobin 10/23/2019 12.3  12.0 - 15.0 g/dL Final  . HCT 10/23/2019 38.4  36.0 - 46.0 % Final  . MCV 10/23/2019 90.6  80.0 - 100.0 fL Final  . MCH 10/23/2019 29.0  26.0 - 34.0 pg Final  . MCHC 10/23/2019 32.0  30.0 - 36.0 g/dL Final  . RDW 10/23/2019 15.4  11.5 - 15.5 % Final  . Platelets 10/23/2019 316  150 - 400 K/uL Final  . nRBC 10/23/2019 0.0  0.0 - 0.2 % Final  . Neutrophils Relative % 10/23/2019 75  % Final  . Neutro Abs 10/23/2019 5.3  1.7 - 7.7 K/uL Final  . Lymphocytes Relative 10/23/2019 11  % Final  . Lymphs Abs 10/23/2019 0.8  0.7 - 4.0 K/uL Final  . Monocytes Relative 10/23/2019 9  % Final  . Monocytes Absolute 10/23/2019 0.6  0.1 - 1.0 K/uL Final  . Eosinophils Relative 10/23/2019 3  % Final  . Eosinophils Absolute 10/23/2019 0.2  0.0 - 0.5 K/uL Final  . Basophils Relative 10/23/2019 1  % Final  . Basophils Absolute 10/23/2019 0.1  0.0 -  0.1 K/uL Final  . Immature Granulocytes 10/23/2019 1  % Final  . Abs Immature Granulocytes 10/23/2019 0.09* 0.00 - 0.07 K/uL Final   Performed at [facility], [place] [address] [place], [place] [address]    (this displays the last labs from the last 3 days)  No results found for: TOTALPROTELP, ALBUMINELP, A1GS, A2GS, BETS, BETA2SER, GAMS, MSPIKE, SPEI (this displays SPEP labs)  No results found for: KPAFRELGTCHN, LAMBDASER, KAPLAMBRATIO (kappa/lambda  light chains)  No results found for: HGBA, HGBA2QUANT, HGBFQUANT, HGBSQUAN (Hemoglobinopathy evaluation)   Lab Results  Component Value Date   LDH 170 09/30/2019    No results found for: IRON, TIBC, IRONPCTSAT (Iron and TIBC)  Lab Results  Component Value Date   FERRITIN 498 (H) 10/19/2019    Urinalysis    Component Value Date/Time   COLORURINE YELLOW 08/28/2019 2326   APPEARANCEUR CLOUDY (A) 08/28/2019 2326   LABSPEC 1.013 08/28/2019 2326   PHURINE 5.0 08/28/2019 2326   GLUCOSEU NEGATIVE 08/28/2019 2326   HGBUR MODERATE (A) 08/28/2019 2326   BILIRUBINUR NEGATIVE 08/28/2019 2326   KETONESUR NEGATIVE 08/28/2019 2326   PROTEINUR NEGATIVE 08/28/2019 2326   NITRITE NEGATIVE 08/28/2019 2326   LEUKOCYTESUR MODERATE (A) 08/28/2019 2326     STUDIES:   ELIGIBLE FOR AVAILABLE RESEARCH PROTOCOL: no  ASSESSMENT: 49 y.o. [place], [place] woman status post right breast upper outer quadrant biopsy 05/01/2019 for a clinical T1c N1, stage IIA invasive ductal carcinoma, grade 3, estrogen and progesterone receptor positive, HER-2 nonamplified, with an MIB-1-1 of 20%.  (a) mass in the axillary tail was a positive lymph node  (1) MammaPrint obtained from the original biopsy shows a high risk luminal subtype B tumor  (2) genetics testing 05/09/2019 through the Common Hereditary Gene Panel offered by [facility] found no deleterious mutations in APC, ATM, AXIN2, BARD1, BMPR1A, BRCA1, BRCA2, BRIP1, CDH1, CDK4, CDKN2A (p14ARF), CDKN2A (p16INK4a), CHEK2, CTNNA1, DICER1, EPCAM (Deletion/duplication testing only), GREM1 (promoter region deletion/duplication testing only), KIT, MEN1, MLH1, MSH2, MSH3, MSH6, MUTYH, NBN, NF1, NHTL1, PALB2, PDGFRA, PMS2, POLD1, POLE, PTEN, RAD50, RAD51C, RAD51D, RNF43, SDHB, SDHC, SDHD, SMAD4, SMARCA4. STK11, TP53, TSC1, TSC2, and VHL.  The following genes were evaluated for sequence changes only: SDHA and HOXB13 c.251G>A variant only.   (a) A variant of uncertain  significance (VUS) was detected in one of her MSH6 genes (c.831A>C).  (3) status post right lumpectomy and sentinel lymph node sampling 06/12/2019 for a pT2 pN1, stage IIA invasive ductal carcinoma, grade 2, with positive margins  (a) a total of 4 sentinel lymph nodes removed, one positive (with ECE), ine itc  (b) margin clearance 04/19/2019 successful medial margin close but negative for DCIS  (4) adjuvant chemotherapy will consist of doxorubicin and cyclophosphamide in dose dense fashion x4 starting 07/10/2019 followed by weekly paclitaxel x12  (a) echo 06/26/2019 shows an EF of 60-65%  (b echo on 09/19/2019 shows an EF of 60-65%  (c) chemotherapy stopped after four cycles of Doxorubicin and cyclophosphamide due to #5 below  (5) Multiple VTE, on Xarelto   (a) presented with SVC syndrome and PE on 09/28/2019  (b) s/p SVC thrombectomy complicated by pericardial effusion and pericardial window   (c) Wound Vac related to wound dehiscence cared for by home health  (6) adjuvant radiation to follow  (7) antiestrogens to start at the completion of local treatment  PLAN:  Veronica [name] is doing better today.  She met with Dr. Jana Hakim.  After discussion, and considering her complicated hospitalization and wound  requiring wound vac, we cannot restart her treatment with Pacltaxel.  Also, Dr. Jana Hakim reviewed that he thinks the risks of restarting chemotherapy will outweight the benefit.  Instead, we will refer Veronica [name] to go ahead and start adjuvant radiation therapy.  After her adjuvant radiation, Dr. Jana Hakim will meet with her to discuss optimizing her anti estrogen therapy.    We will see Veronica [name] back at the end of March, 2021. She was recommended to continue with the appropriate pandemic precautions. She knows to call for any questions that may arise between now and her next appointment.  We are happy to see her sooner if needed.   Wilber Bihari, NP Medical Oncology and Hematology [facility] [place], [place] [address] Tel. [phone]    Fax. [phone]   ADDENDUM: Veronica [name] situation is difficult.  She had multiple complications relating to her port, clot, thrombectomy, and pericardial tamponade.  She is recovering from all this, although she still has a wound VAC in place.  She has not had chemotherapy since 09/24/2019.  Currently she does not have good access.  In the original registration study adding 12 cycles of paclitaxel to the initial 4 cycles of cyclophosphamide and doxorubicin resulted in a benefit of net 3%.  While I had to forego that benefit, I am concerned that if we try to give chemotherapy to Veronica [name] at this point we are going to land her in more trouble.  Instead I think a better plan will be to try to optimize her antiestrogens.  The 2 ways of doing that are lengthen the amount of time on antiestrogens and then consideration of a second drug such as a CDK inhibitor understanding that the results of using this drug in nonstage 4 settings have been mixed.  The plan then is to move on to radiation if it is the patient is able to receive it.  She will be meeting with Dr. Lisbeth [name] 11/01/2019 to operationalize that.  Total encounter time 25 minutes.*  I personally saw this patient and performed a substantive portion of this encounter with the listed APP documented above.   Chauncey Cruel, MD Medical Oncology and Hematology [facility] [address] [place], [place] [address] Tel. [phone]    Fax. [phone]

## 2019-10-24 ENCOUNTER — Telehealth: Payer: Self-pay | Admitting: Oncology

## 2019-10-24 NOTE — Telephone Encounter (Signed)
I talk with patient regarding schedule  

## 2019-10-29 ENCOUNTER — Encounter: Payer: Self-pay | Admitting: *Deleted

## 2019-10-29 ENCOUNTER — Telehealth: Payer: Self-pay | Admitting: *Deleted

## 2019-10-29 NOTE — Telephone Encounter (Signed)
Veronica [name]'s HHN called the office to relate continued skn problems with the use of the WOUND VAC to her sternal wound since discharge.  She is experiencing worsening blisters with each usage/change.  Ms. Arms states these started while she was still in the hospital when treatment was started.  I suggested skin prep, but that has already been in use.  I suggested changing to w-d N/S packings today and tomorrow and then see Dr. Cyndia Bent on Wednesday to reassess the wound and care going forward.  She was in total agreement with this plan.  All wound vac supplies will be brought to the office.

## 2019-10-30 ENCOUNTER — Ambulatory Visit: Payer: 59 | Admitting: Adult Health

## 2019-10-30 ENCOUNTER — Ambulatory Visit: Payer: 59

## 2019-10-30 ENCOUNTER — Other Ambulatory Visit: Payer: 59

## 2019-10-30 ENCOUNTER — Other Ambulatory Visit: Payer: Self-pay | Admitting: Surgery

## 2019-10-30 DIAGNOSIS — J90 Pleural effusion, not elsewhere classified: Secondary | ICD-10-CM

## 2019-10-30 NOTE — Progress Notes (Signed)
Radiation Oncology         ([phone]) [phone] ________________________________  Follow Up New Visit - Conducted via telephone due to current COVID-19 concerns for limiting patient exposure  I spoke with the patient to conduct this consult visit via telephone to spare the patient unnecessary potential exposure in the healthcare setting during the current COVID-19 pandemic. The patient was notified in advance and was offered a [place] meeting to allow for face to face communication but unfortunately reported that they did not have the appropriate resources/technology to support such a visit and instead preferred to proceed with a telephone visit. ________________________________  Name: Veronica [name]        MRN: [id number]  Date of Service: 10/31/2019 DOB: 10-02-1971  XV:QMGQ, Caryl Pina, PA-C  Causey, Lindsey Cornett*     REFERRING PHYSICIAN: Wilber Bihari Cornett*   DIAGNOSIS: The encounter diagnosis was Malignant neoplasm of upper-outer quadrant of right breast in female, estrogen receptor positive ([place]).   HISTORY OF PRESENT ILLNESS: Veronica [name] is a 49 y.o. female originally seen in the multidisciplinary breast clinic for a new diagnosis of right breast cancer. The patient was noted to have a screening detected abnormality in the rigth breast. On diagnostic imaging there was a mass at the 10:00 posiiton, measuring 1.7 x 1.5 x 1.5 cm, and her axilla was negative. Interestingly though in the axillary tail of the breast, there was a 6 x 5 x 5 mm lesion also at 10:00. She underwent a biopsy of the breast revealing a grade 3, invasive ductal carcinoma ER positive with moderate staining, PR positive, and HER2 was negative. Ki 67 was 20%. In the lesion in the axillary tail, the tissue was confirmed to be lymphoid and consistent with metastatic disease. She proceeded with right lumpectomy and targeted axillary dissection on 06/12/2019. Final pathology revealed a grade 2, 3.4 cm invasive dcutal carcinom with  intermediate DCIS. Her carcinoma was noted at the medial and posterior margins with LVSI. Metastatic disease was noted in one of the axillary nodes with extracapsular extension, LVSI, and another axillary node contained isolated tumor cells, the two other nodes sampled of four were negative. She underwent re-excision on 07/17/2019 revealed DCIS in the right medial margin measuring 1.8 cm, and this was 1 mm from the final medial margin. She began adjuvant chemotherapy on 07/24/2019. 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 She was hospitalized with pericardial effusion/tamponade following thrombectomy and underwent pericardial window. Her wound dehisced and she was taken to the OR on 10/16/19 by Dr. Cyndia Bent. The wound was dressed with a wound vac which recently was removed last week. She continues wet to dry dressings. As there is no plan to continue systemic chemotherapy, she is now ready to consider adjuvant radiotherapy and is contacted by phone to discuss this.    PREVIOUS RADIATION THERAPY: No   PAST MEDICAL HISTORY:  Past Medical History:  Diagnosis Date  . Cancer ([place])   . Chronic low back pain with left-sided sciatica   . Family history of leukemia   . Family history of stomach cancer   . Hypertension        PAST SURGICAL HISTORY: Past Surgical History:  Procedure Laterality Date  . APPLICATION OF WOUND VAC N/A 10/16/2019   Procedure: APPLICATION OF WOUND VAC;  Surgeon: Gaye Pollack, MD;  Location: [place];  Service: Vascular;  Laterality: N/A;  . BREAST BIOPSY Right  05/02/2019   right clips X2  . BREAST LUMPECTOMY WITH RADIOACTIVE SEED AND SENTINEL LYMPH NODE BIOPSY Right 06/12/2019   Procedure: RIGHT BREAST RADIOACTIVE SEED LUMPECTOMY  AND RIGHT AXILLARY SEED TARGETED LYMPH NODE AND AXILLARY SENTINEL LYMPH NODE MAPPING;  Surgeon: Erroll Luna, MD;  Location: [place];  Service: General;  Laterality: Right;  PEC BLOCK  . C/S x2  '98 '03  . CENTRAL VENOUS CATHETER INSERTION  10/02/2019   Procedure: Insertion Central Line Adult;  Surgeon: Serafina Mitchell, MD;  Location: [place];  Service: Vascular;;  . CESAREAN SECTION    . CHOLECYSTECTOMY    . I & D EXTREMITY N/A 10/16/2019   Procedure: DEBRIDEMENT of DEHISCED MIDLINE SURGICAL INCISION;  Surgeon: Gaye Pollack, MD;  Location: [facility];  Service: Vascular;  Laterality: N/A;  . LAPAROSCOPIC ASSISTED VAGINAL HYSTERECTOMY  05/17/2011   Procedure: LAPAROSCOPIC ASSISTED VAGINAL HYSTERECTOMY;  Surgeon: Sharene Butters;  Location: [facility];  Service: Gynecology;  Laterality: N/A;  Laparoscopic Assisted Vaginal Hysterectomy With Lysis Of Adhesions  . PERICARDIAL WINDOW N/A 10/02/2019   Procedure: Pericardial Window;  Surgeon: Serafina Mitchell, MD;  Location: [place];  Service: Vascular;  Laterality: N/A;  . PORT-A-CATH REMOVAL  10/02/2019   Procedure: Removal Port-A-Cath;  Surgeon: Serafina Mitchell, MD;  Location: [place];  Service: Vascular;;  . PORTACATH PLACEMENT Right 06/12/2019   Procedure: INSERTION PORT-A-CATH WITH ULTRASOUND;  Surgeon: Erroll Luna, MD;  Location: [place];  Service: General;  Laterality: Right;  . RE-EXCISION OF BREAST LUMPECTOMY Right 07/17/2019   Procedure: RE-EXCISION OF RIGHT BREAST LUMPECTOMY;  Surgeon: Erroll Luna, MD;  Location: [place];  Service: General;  Laterality: Right;  . TUBAL LIGATION    . ULTRASOUND GUIDANCE FOR VASCULAR ACCESS  10/02/2019   Procedure: Ultrasound Guidance For Vascular Access;  Surgeon: Serafina Mitchell, MD;  Location: [facility];  Service: Vascular;;  . VENOGRAM N/A 10/02/2019   Procedure: Central VENOGRAM;  Surgeon: Serafina Mitchell, MD;  Location: [facility];  Service: Vascular;  Laterality: N/A;     FAMILY HISTORY:  Family History  Problem Relation Age of Onset  . Arthritis Mother   . COPD Mother   . Hypertension Mother     . Hyperlipidemia Mother   . Leukemia Brother 97  . Stomach cancer Maternal Grandmother        late 63s     SOCIAL HISTORY:  reports that she has never smoked. She has never used smokeless tobacco. She reports current alcohol use of about 2.0 standard drinks of alcohol per week. She reports that she does not use drugs. The patient is married and lives in [place]. 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   MEDICATIONS:  Current Outpatient Medications  Medication Sig Dispense Refill  . acetaminophen (TYLENOL) 500 MG tablet Take 500 mg by mouth every 6 (six) hours as needed for mild pain.     . Cholecalciferol (VITAMIN D3) 25 MCG (1000 UT) CHEW Chew 1 tablet by mouth daily.     . famotidine (PEPCID) 10 MG tablet Take 10 mg by mouth 2 (two) times daily.    . furosemide (LASIX) 20 MG tablet Take 0.5 tablets (10 mg total) by mouth daily. 30 tablet 0  . loratadine (CLARITIN) 10 MG tablet Take 10 mg by mouth See admin instructions. Takes for 5 days after chemo    . metoprolol tartrate (LOPRESSOR) 25 MG tablet Take 1  tablet (25 mg total) by mouth 2 (two) times daily. 60 tablet 0  . [START ON 11/19/2019] rivaroxaban (XARELTO) 20 MG TABS tablet Take 1 tablet (20 mg total) by mouth daily with supper. 30 tablet 3  . Rivaroxaban 15 & 20 MG TBPK Follow package directions: Take one 28m tablet by mouth twice a day. On day 22, switch to one 255mtablet once a day. Take with food. 51 each 0   No current facility-administered medications for this visit.     REVIEW OF SYSTEMS: On review of systems, the patient reports that she is doing well overall. Her breathing is improved she states and she's trying to increase her physical activity. She still has an open area below the sternum that she's caring for, but states this area is about the size of a silver dollar. She sees Dr. BaCyndia Bentater today. She denies any chest pain,  cough, fevers, chills, night sweats, unintended  weight changes. She denies any bowel or bladder disturbances, and denies abdominal pain, nausea or vomiting. She denies any new musculoskeletal or joint aches or pains. A complete review of systems is obtained and is otherwise negative.     PHYSICAL EXAM:  Unable to assess due to encounter type.    ECOG = 0  0 - Asymptomatic (Fully active, able to carry on all predisease activities without restriction)  1 - Symptomatic but completely ambulatory (Restricted in physically strenuous activity but ambulatory and able to carry out work of a light or sedentary nature. For example, light housework, office work)  2 - Symptomatic, <50% in bed during the day (Ambulatory and capable of all self care but unable to carry out any work activities. Up and about more than 50% of waking hours)  3 - Symptomatic, >50% in bed, but not bedbound (Capable of only limited self-care, confined to bed or chair 50% or more of waking hours)  4 - Bedbound (Completely disabled. Cannot carry on any self-care. Totally confined to bed or chair)  5 - Death   OkEustace PenM, Creech RH, Tormey DC, et al. ([phone] "Toxicity and response criteria of the [facility]". AmGlenwoodncol. 5 (6): 649-55    LABORATORY DATA:  Lab Results  Component Value Date   WBC 7.1 10/23/2019   HGB 12.3 10/23/2019   HCT 38.4 10/23/2019   MCV 90.6 10/23/2019   PLT 316 10/23/2019   Lab Results  Component Value Date   NA 143 10/23/2019   K 3.9 10/23/2019   CL 107 10/23/2019   CO2 22 10/23/2019   Lab Results  Component Value Date   ALT 27 10/23/2019   AST 27 10/23/2019   ALKPHOS 97 10/23/2019   BILITOT 0.8 10/23/2019      RADIOGRAPHY: DG Chest 1 View  Result Date: 10/15/2019 CLINICAL DATA:  Post thoracentesis EXAM: CHEST  1 VIEW COMPARISON:  10/09/2019 FINDINGS: Improvement in small bilateral effusions and bibasilar atelectasis. No pneumothorax post thoracentesis. IMPRESSION: No pneumothorax post thoracentesis.  Improvement in bilateral effusions and bibasilar consolidation. Electronically Signed   By: ChFranchot Gallo.D.   On: 10/15/2019 15:54   CT ANGIO CHEST PE W OR WO CONTRAST  Addendum Date: 10/13/2019   ADDENDUM REPORT: 10/13/2019 23:36 ADDENDUM: These results were called by telephone at the time of interpretation on 10/13/2019 at 11:36 pm to provider NP BlKennon Holterwho verbally acknowledged these results. Electronically Signed   By: ChConstance Holster.D.   On: 10/13/2019 23:36   Result Date: 10/13/2019  CLINICAL DATA:  Concern for PE. Concern SVC syndrome with history of breast cancer in known DVT. EXAM: CT ANGIOGRAPHY CHEST WITH CONTRAST TECHNIQUE: Multidetector CT imaging of the chest was performed using the standard protocol during bolus administration of intravenous contrast. Multiplanar CT image reconstructions and MIPs were obtained to evaluate the vascular anatomy. CONTRAST:  58m OMNIPAQUE IOHEXOL 350 MG/ML SOLN COMPARISON:  September 28, 2019 FINDINGS: Cardiovascular: Contrast injection is sufficient to demonstrate satisfactory opacification of the pulmonary arteries to the segmental level.There are persistent acute segmental pulmonary emboli involving the left lower lobe. There are scattered bilateral subsegmental and segmental pulmonary emboli involving the bilateral upper lobes. There appears to be lobar thrombus within the right upper lobe pulmonary artery. There is evidence for right-sided heart strain with an RV LV ratio measuring approximately 1.1. The heart size is normal. Again noted is thrombus within the SVC, improved from prior study. The right brachiocephalic vein is not well evaluated secondary to contrast timing. There may be superior extension of thrombus into this vein. Aortic calcifications are noted. There are postsurgical changes of the mediastinum likely related to open thrombectomy. Mediastinum/Nodes: --there are few mildly enlarged mediastinal lymph nodes. --No axillary lymphadenopathy.  --No supraclavicular lymphadenopathy. --Normal thyroid gland. --The esophagus is unremarkable Lungs/Pleura: There is a moderate to large right-sided pleural effusion, increased from prior study. There is a small to moderate-sized left-sided pleural effusion, increased from prior study. There is complete collapse of the right lower lobe. There is atelectasis at the left lung base. There is no pneumothorax. Upper Abdomen: No acute abnormality. Musculoskeletal: There is extensive overlying skin thickening involving the right breast. There is a right breast mass measuring approximately 8.1 by 4.8 cm and approximately 0 Hounsfield units. There is no acute displaced fracture. Review of the MIP images confirms the above findings. IMPRESSION: 1. Acute bilateral pulmonary emboli are again noted as detailed above. There is CT evidence for right-sided heart strain with an RV/LV ratio measuring approximately 1.1. 2. Persistent but improved thrombus within the SVC. 3. Growing bilateral pleural effusions now moderate to large on the right and small to moderate-sized on the left. 4. Complete collapse of the right lower lobe. 5. Low-attenuation right-sided breast mass as detailed above. This could represent a postoperative seroma or hematoma in the appropriate clinical setting. An abscess is not entirely excluded. There is significant asymmetric skin thickening overlying the right breast likely related to the patient's reported history of breast cancer. Electronically Signed: By: CConstance HolsterM.D. On: 10/13/2019 23:24   DG Chest Port 1 View  Result Date: 10/09/2019 CLINICAL DATA:  Pleural effusion. EXAM: PORTABLE CHEST 1 VIEW COMPARISON:  Radiograph 10/06/2019. Chest CT 09/28/2019 FINDINGS: Hazy lung base opacities are not significantly changed from prior exam, likely combination of pleural fluid and atelectasis. Unchanged heart size and mediastinal contours. Pericardial drainage catheter on prior exam is not well  visualized currently. No visualized pneumothorax. IMPRESSION: 1. Unchanged hazy lung base opacities, likely combination of pleural fluid and atelectasis. 2. Unchanged mediastinal contours and heart size after removal of pericardial drainage catheter. Electronically Signed   By: MKeith RakeM.D.   On: 10/09/2019 06:32   DG Chest Port 1 View  Result Date: 10/06/2019 CLINICAL DATA:  Hemopericardium.  COVID-19 positive. EXAM: PORTABLE CHEST 1 VIEW COMPARISON:  10/03/2019 FINDINGS: Findings suggesting pericardial drain over the medial right base unchanged. Worsening bilateral moderate size pleural effusions likely with associated basilar atelectasis. Prominence of the cardiac silhouette with possible slight worsening. Remainder of the  exam is unchanged. IMPRESSION: 1. Worsening bilateral moderate size pleural effusions likely with associated basilar atelectasis. 2. Possible slight worsening prominence of the cardiac silhouette with pericardial drainage catheter unchanged. Electronically Signed   By: Marin Olp M.D.   On: 10/06/2019 08:34   DG CHEST PORT 1 VIEW  Result Date: 10/03/2019 CLINICAL DATA:  History of cardiac tamponade. EXAM: PORTABLE CHEST 1 VIEW COMPARISON:  10/02/2019 FINDINGS: Probable pericardial drainage catheter on the right side. No change. The endotracheal tube has been removed. Suspect small right pleural effusion. No infiltrates or worrisome pulmonary lesions. Stable slightly prominent mediastinal contours. IMPRESSION: Interval removal of ET tube. Stable pericardial drainage catheter. Possible small right effusion. Electronically Signed   By: Marijo Sanes M.D.   On: 10/03/2019 12:18   DG Chest Portable 1 View  Addendum Date: 10/02/2019   ADDENDUM REPORT: 10/02/2019 17:48 ADDENDUM: These results were called by telephone at the time of interpretation on 10/02/2019 at 5:47 pm to provider Harold Barban , who verbally acknowledged these results. Electronically Signed   By: Zetta Bills M.D.   On: 10/02/2019 17:48   Result Date: 10/02/2019 CLINICAL DATA:  Instrument count, emergent case. Rule out foreign body in chest cavity. EXAM: PORTABLE CHEST 1 VIEW COMPARISON:  Chest x-ray of 09/24/2019 FINDINGS: Endotracheal tube approximately 3 cm above the carina. Mild shift from right to left of the trachea. Mild widening of mediastinal contours, potentially Lea somewhat accentuated by AP position. Right-sided chest tube or mediastinal drain is seen passing into the right cardiophrenic region. Lungs with mild increased interstitial markings or vascular crowding. No signs of radiopaque foreign body, unexpected with pacer defibrillator pad projecting over right upper chest. No acute bone process. IMPRESSION: 1. No unexpected radiopaque foreign body. 2. Mild widening of the mediastinal contours, potentially extension weighted by position but likely related to recent surgery in this location given right-sided chest tube. 3. Right-sided chest tube or mediastinal drain. No signs of pneumothorax. 4. A call is out to the referring provider further discuss findings in the above case. Electronically Signed: By: Zetta Bills M.D. On: 10/02/2019 17:43   ECHO INTRAOPERATIVE TEE  Result Date: 10/02/2019  *INTRAOPERATIVE TRANSESOPHAGEAL REPORT *  Patient Name:   Veronica [name] Date of Exam: 10/02/2019 Medical Rec #:  [id number]      Height:       61.0 in Accession #:    [id number]     Weight:       211.0 lb Date of Birth:  [birth year]      BSA:          1.93 m Patient Age:    25 years       BP:           115/75 mmHg Patient Gender: F              HR:           100 bpm. Exam Location:  Anesthesiology Transesophogeal exam was perform intraoperatively during surgical procedure. Patient was closely monitored under general anesthesia during the entirety of examination. Indications:     TEE Evaluation of SVC Clot Performing Phys: [id number] BRABHAM Complications: No known complications during this procedure.  SUMMARY  Initial Exam:  - Significant clot burden in the SVC, no clot noted in the IVC, RA or RV. Minimum to no flow through the SVC. - Small Pericardial effusion noted, no evidence of tamponade.  Post Thrombectomy:  - Significantly reduced clot burden in the SVC  with improved flow on venogram. - Large, expanding circumferential pericardial effusion with evidence of tamponade, RV compression and hemodyanmic instability.  Post Pericardal Window:  - Resolution of the pericardial effusion, no evidence of tamponade remains. - LV function normal. LVEF > 65%. - Small clot burden in the SVC.  PRE-OP FINDINGS  Left Ventricle: The left ventricle has normal systolic function, with an ejection fraction of 60-65%. The cavity size was normal. There is no increase in left ventricular wall thickness. No evidence of left ventricular regional wall motion abnormalities. Right Ventricle: The right ventricle has normal systolic function. The cavity was normal. There is no increase in right ventricular wall thickness. Left Atrium: Left atrial size was normal in size. The left atrial appendage is well visualized and there is no evidence of thrombus present. Left atrial appendage velocity is normal at greater than 40 cm/s. Right Atrium: Right atrial size was normal in size. Right atrial pressure is estimated at 10 mmHg. Interatrial Septum: No atrial level shunt detected by color flow Doppler. Pericardium: A small pericardial effusion is present. The pericardial effusion is localized near the right ventricle. There is no pleural effusion. Mitral Valve: The mitral valve is normal in structure. No thickening of the mitral valve leaflet. No calcification of the mitral valve leaflet. Mitral valve regurgitation is trivial by color flow Doppler. There is no evidence of mitral valve vegetation. Tricuspid Valve: The tricuspid valve was normal in structure. Tricuspid valve regurgitation is trivial by color flow Doppler. The jet is directed  centrally. No TV vegetation was visualized. Aortic Valve: The aortic valve is tricuspid Aortic valve regurgitation was not visualized by color flow Doppler. There is no evidence of aortic valve stenosis. There is no evidence of a vegetation on the aortic valve. Pulmonic Valve: The pulmonic valve was normal in structure. Pulmonic valve regurgitation is not visualized by color flow Doppler. Aorta: The aortic root, ascending aorta and aortic arch are normal in size and structure.  Suella Broad MD Electronically signed by Suella Broad MD Signature Date/Time: 10/02/2019/5:34:48 PM    Final    VAS [place] LOWER EXTREMITY VENOUS (DVT)  Result Date: 10/03/2019  Lower Venous Study Indications: Pulmonary embolism.  Comparison Study: no prior Performing Technologist: Abram Sander RVS  Examination Guidelines: A complete evaluation includes B-mode imaging, spectral Doppler, color Doppler, and power Doppler as needed of all accessible portions of each vessel. Bilateral testing is considered an integral part of a complete examination. Limited examinations for reoccurring indications may be performed as noted.  +---------+---------------+---------+-----------+----------+--------------+ RIGHT    CompressibilityPhasicitySpontaneityPropertiesThrombus Aging +---------+---------------+---------+-----------+----------+--------------+ CFV      None           Yes      Yes                  Acute          +---------+---------------+---------+-----------+----------+--------------+ SFJ      Full                                                        +---------+---------------+---------+-----------+----------+--------------+ FV Prox  Full                                                        +---------+---------------+---------+-----------+----------+--------------+  FV Mid   Full                                                        +---------+---------------+---------+-----------+----------+--------------+  FV DistalFull                                                        +---------+---------------+---------+-----------+----------+--------------+ PFV      None                                         Acute          +---------+---------------+---------+-----------+----------+--------------+ POP      None           Yes      Yes                  Acute          +---------+---------------+---------+-----------+----------+--------------+ PTV      Full                                                        +---------+---------------+---------+-----------+----------+--------------+ PERO     Full                                                        +---------+---------------+---------+-----------+----------+--------------+   +---------+---------------+---------+-----------+----------+--------------+ LEFT     CompressibilityPhasicitySpontaneityPropertiesThrombus Aging +---------+---------------+---------+-----------+----------+--------------+ CFV      Full           Yes      Yes                                 +---------+---------------+---------+-----------+----------+--------------+ SFJ      Full                                                        +---------+---------------+---------+-----------+----------+--------------+ FV Prox  Full                                                        +---------+---------------+---------+-----------+----------+--------------+ FV Mid   Full                                                        +---------+---------------+---------+-----------+----------+--------------+  FV DistalFull                                                        +---------+---------------+---------+-----------+----------+--------------+ PFV      Full                                                        +---------+---------------+---------+-----------+----------+--------------+ POP      Full           Yes      Yes                                  +---------+---------------+---------+-----------+----------+--------------+ PTV      Full                                                        +---------+---------------+---------+-----------+----------+--------------+ PERO     Full                                                        +---------+---------------+---------+-----------+----------+--------------+     Summary: Right: Findings consistent with acute deep vein thrombosis involving the right proximal profunda vein, right common femoral vein, and right popliteal vein. No cystic structure found in the popliteal fossa. Left: There is no evidence of deep vein thrombosis in the lower extremity. No cystic structure found in the popliteal fossa.  *See table(s) above for measurements and observations. Electronically signed by Curt Jews MD on 10/03/2019 at 4:05:14 PM.    Final    VAS [place] UPPER EXTREMITY VENOUS DUPLEX  Result Date: 10/13/2019 UPPER VENOUS STUDY  Indications: Swelling, and SVC syndrome Risk Factors: Cancer. Comparison Study: No prior studies. Performing Technologist: Oliver Hum RVT  Examination Guidelines: A complete evaluation includes B-mode imaging, spectral Doppler, color Doppler, and power Doppler as needed of all accessible portions of each vessel. Bilateral testing is considered an integral part of a complete examination. Limited examinations for reoccurring indications may be performed as noted.  Right Findings: +----------+------------+---------+-----------+----------+-------+ RIGHT     CompressiblePhasicitySpontaneousPropertiesSummary +----------+------------+---------+-----------+----------+-------+ IJV           None       No        No                Acute  +----------+------------+---------+-----------+----------+-------+ Subclavian    None       No        No                Acute  +----------+------------+---------+-----------+----------+-------+ Axillary       None       No        No                Acute  +----------+------------+---------+-----------+----------+-------+ Brachial  Partial      No        No                Acute  +----------+------------+---------+-----------+----------+-------+ Radial      Partial                                  Acute  +----------+------------+---------+-----------+----------+-------+ Ulnar       Partial                                  Acute  +----------+------------+---------+-----------+----------+-------+ Cephalic      Full                                          +----------+------------+---------+-----------+----------+-------+ Basilic       None                                   Acute  +----------+------------+---------+-----------+----------+-------+  Left Findings: +----------+------------+---------+-----------+----------+-------+ LEFT      CompressiblePhasicitySpontaneousPropertiesSummary +----------+------------+---------+-----------+----------+-------+ IJV           Full       Yes       Yes                      +----------+------------+---------+-----------+----------+-------+ Subclavian    Full       Yes       Yes                      +----------+------------+---------+-----------+----------+-------+ Axillary      None       No        No                Acute  +----------+------------+---------+-----------+----------+-------+ Brachial    Partial      No        No                Acute  +----------+------------+---------+-----------+----------+-------+ Radial        Full                                          +----------+------------+---------+-----------+----------+-------+ Ulnar         Full                                          +----------+------------+---------+-----------+----------+-------+ Cephalic      None                                   Acute  +----------+------------+---------+-----------+----------+-------+  Basilic       Full                                          +----------+------------+---------+-----------+----------+-------+  Summary:  Right: Findings consistent with acute deep vein  thrombosis involving the right internal jugular vein, right subclavian vein, right axillary vein, right brachial veins, right radial veins and right ulnar veins. Findings consistent with acute superficial vein thrombosis involving the right basilic vein.  Left: Findings consistent with acute deep vein thrombosis involving the left axillary vein and left brachial veins. Findings consistent with acute superficial vein thrombosis involving the left cephalic vein.  *See table(s) above for measurements and observations.  Diagnosing physician: Deitra Mayo MD Electronically signed by Deitra Mayo MD on 10/13/2019 at 4:31:10 PM.    Final    ECHOCARDIOGRAM LIMITED  Result Date: 10/15/2019   ECHOCARDIOGRAM LIMITED REPORT   Patient Name:   Veronica [name] Date of Exam: 10/15/2019 Medical Rec #:  [id number]      Height:       61.0 in Accession #:    [id number]     Weight:       225.3 lb Date of Birth:  [birth year]      BSA:          1.99 m Patient Age:    41 years       BP:           82/69 mmHg Patient Gender: F              HR:           101 bpm. Exam Location:  Inpatient  Procedure: Limited Echo, Cardiac Doppler and Limited Color Doppler Indications:    I31.3 Pericardial effusion  History:        Patient has prior history of Echocardiogram examinations, most                 recent 10/08/2019. COVID-19 Positive. Cancer.  Sonographer:    Jonelle Sidle Dance Referring Phys: [place]  1. Left ventricular ejection fraction, by visual estimation, is 60 to 65%. The left ventricle has normal function. There is no increased left ventricular wall thickness.  2. The left ventricle has no regional wall motion abnormalities.  3. Global right ventricle has normal systolc function.The right ventricular size is normal. no  increase in right ventricular wall thickness.  4. The mitral valve is normal in structure. No evidence of mitral valve regurgitation.  5. The tricuspid valve was normal in structure. Tricuspid valve regurgitation is mild.  6. Tricuspid valve regurgitation is mild.  7. The pulmonic valve was normal in structure.  8. Mildly elevated pulmonary artery systolic pressure.  9. The atrial septum is grossly normal. FINDINGS  Left Ventricle: Left ventricular ejection fraction, by visual estimation, is 60 to 65%. The left ventricle has normal function. The left ventricle has no regional wall motion abnormalities. There is no increased left ventricular wall thickness. Right Ventricle: The right ventricular size is normal. No increase in right ventricular wall thickness. Global RV systolic function is has normal systolic function. The tricuspid regurgitant velocity is 2.62 m/s, and with an assumed right atrial pressure  of 8 mmHg, the estimated right ventricular systolic pressure is mildly elevated at 35.5 mmHg. Left Atrium: Left atrial size was normal in size. Right Atrium: Right atrial size was normal in size. Right atrial pressure is estimated at 8 mmHg. Pericardium: There is no evidence of pericardial effusion is seen. There is no evidence of pericardial effusion. Mitral Valve: The mitral valve is normal in structure. MV Area by PHT, 4.15 cm. MV PHT, 53.07 msec. No evidence of mitral valve regurgitation. Tricuspid Valve: The tricuspid valve is normal in structure. Tricuspid  valve regurgitation is mild. Aortic Valve: The aortic valve is normal in structure. Aortic valve regurgitation is not visualized. Pulmonic Valve: The pulmonic valve was normal in structure. Pulmonic valve regurgitation is not visualized by color flow Doppler. Pulmonic regurgitation is not visualized by color flow Doppler. Aorta: The aortic root and ascending aorta are structurally normal, with no evidence of dilitation. Shunts: The atrial septum is  grossly normal.  LEFT VENTRICLE          Normals PLAX 2D LVIDd:         3.70 cm  3.6 cm LVIDs:         2.52 cm  1.7 cm LV PW:         0.77 cm  1.4 cm LV IVS:        0.80 cm  1.3 cm LVOT diam:     1.90 cm  2.0 cm LV SV:         35 ml    79 ml LV SV Index:   16.39    45 ml/m2 LVOT Area:     2.84 cm 3.14 cm2  IVC IVC diam: 1.92 cm LEFT ATRIUM             Index LA diam:        3.70 cm 1.86 cm/m LA Vol (A2C):   39.6 ml 19.93 ml/m LA Vol (A4C):   36.9 ml 18.57 ml/m LA Biplane Vol: 40.2 ml 20.23 ml/m  AORTIC VALVE             Normals LVOT Vmax:   122.00 cm/s LVOT Vmean:  70.700 cm/s 75 cm/s LVOT VTI:    0.206 m     25.3 cm  AORTA                 Normals Ao Root diam: 2.60 cm 31 mm Ao Asc diam:  2.40 cm 31 mm MITRAL VALVE              Normals   TRICUSPID VALVE             Normals MV Area (PHT): 4.15 cm             TR Peak grad:   27.5 mmHg MV PHT:        53.07 msec 55 ms     TR Vmax:        262.00 cm/s 288 cm/s MV Decel Time: 183 msec   187 ms MV E velocity: 82.50 cm/s 103 cm/s  SHUNTS MV A velocity: 68.90 cm/s 70.3 cm/s Systemic VTI:  0.21 m MV E/A ratio:  1.20       1.5       Systemic Diam: 1.90 cm  Mertie Moores MD Electronically signed by Mertie Moores MD Signature Date/Time: 10/15/2019/11:13:54 AMThe mitral valve is normal in structure.    Final    ECHOCARDIOGRAM LIMITED  Result Date: 10/08/2019   ECHOCARDIOGRAM LIMITED REPORT   Patient Name:   Veronica [name] Date of Exam: 10/08/2019 Medical Rec #:  [id number]      Height:       61.0 in Accession #:    [id number]     Weight:       211.0 lb Date of Birth:  Nov 02, 1970      BSA:          1.93 m Patient Age:    59 years       BP:  117/80 mmHg Patient Gender: F              HR:           109 bpm. Exam Location:  Inpatient  Procedure: Limited Echo, Cardiac Doppler and Color Doppler Indications:    I31.3 Pericardial effusion (noninflammatory)  History:        Patient has prior history of Echocardiogram examinations, most                 recent 10/03/2019.  Pericardial effusion. Cancer. Chemo.                 Tamponade. Covid 19 positive.  Sonographer:    Roseanna Rainbow RDCS Referring Phys: [id number] PETER VAN TRIGT  Sonographer Comments: Technically difficult study due to poor echo windows, suboptimal subcostal window, no subcostal window and patient is morbidly obese. Image acquisition challenging due to patient body habitus. Abdominal dressing in subcostal region. IMPRESSIONS  1. Left ventricular ejection fraction, by visual estimation, is 60 to 65%.  2. Small to moderate, circumferential but mostly posterior pericardial effusion.  3. The pericardial effusion is posterior to the left ventricle.  4. The inferior vena cava is dilated in size with <50% respiratory variability, suggesting right atrial pressure of 15 mmHg.  5. Unable to directly compare prior study on 10/03/19 due to suboptimal images, however, in comparison to TEE images during pericardial window, there appears to be a larger pericardial effusion FINDINGS  Left Ventricle: Left ventricular ejection fraction, by visual estimation, is 60 to 65%. Pericardium: A small pericardial effusion is present is seen. A small pericardial effusion is present. The pericardial effusion is posterior to the left ventricle. Venous: The inferior vena cava is dilated in size with less than 50% respiratory variability, suggesting right atrial pressure of 15 mmHg.  LEFT VENTRICLE         Normals PLAX 2D LVIDd:         3.45 cm 3.6 cm LVIDs:         2.50 cm 1.7 cm LV PW:         1.15 cm 1.4 cm LV IVS:        1.10 cm 1.3 cm LV SV:         27 ml   79 ml LV SV Index:   12.88   45 ml/m2  at IVC IVC diam: 2.20 cm LEFT ATRIUM         Index LA diam:    3.00 cm 1.55 cm/m   AORTA                 Normals Ao Root diam: 2.75 cm 31 mm  Lyman Bishop MD Electronically signed by Lyman Bishop MD Signature Date/Time: 10/08/2019/5:07:46 PM    Final    ECHOCARDIOGRAM LIMITED  Result Date: 10/03/2019   ECHOCARDIOGRAM LIMITED REPORT   Patient Name:    Veronica [name] Date of Exam: 10/03/2019 Medical Rec #:  [id number]      Height:       61.0 in Accession #:    [id number]     Weight:       211.0 lb Date of Birth:  1970/11/16      BSA:          1.93 m Patient Age:    29 years       BP:           n/a mmHg Patient Gender: F  HR:           111 bpm. Exam Location:  Inpatient  Procedure: Limited Echo STAT ECHO Indications:    cardiac tamponade  History:        Patient has prior history of Echocardiogram examinations, most                 recent 09/30/2019. Covid + . cancer.  Sonographer:    [name] RDCS (AE) Referring Phys: [id number] RAKESH V ALVA  Sonographer Comments: Technically difficult study due to poor echo windows, suboptimal parasternal window, suboptimal apical window and no subcostal window. Image acquisition challenging due to patient body habitus. challenging windows. IMPRESSIONS  1. Extremely limited; LV function appears to be preserved; focal wall motion abnormality cannot be excluded; no pericardial effusion noted on limited images; no other useful information could be obtained with this study. FINDINGS  Additional Comments: Extremely limited; LV function appears to be preserved; focal wall motion abnormality cannot be excluded; no pericardial effusion noted on limited images; no other useful information could be obtained with this study.  Kirk Ruths MD Electronically signed by Kirk Ruths MD Signature Date/Time: 10/03/2019/2:27:50 PM   Final    HYBRID OR IMAGING ([place])  Result Date: 10/02/2019 There is no interpretation for this exam.  This order is for images obtained during a surgical procedure.  Please See "Surgeries" Tab for more information regarding the procedure.       IMPRESSION/PLAN: 1. Stage IIA, cT1cN1M0 grade 3, ER/PR positive invasive ductal carcinoma of the right breast. Dr. Lisbeth Renshaw reviewed the final pathology findings and reviews the nature of invasive right breast disease. She has completed systemic therapy  now and is still healing from her wound revision surgery, however once she's healed, she would benefit from external radiotherapy to the breast and regional nodes followed by antiestrogen therapy. We discussed the risks, benefits, short, and long term effects of radiotherapy, and the patient is interested in proceeding at the appropriate time. Dr. Lisbeth Renshaw discusses the delivery and logistics of radiotherapy and recommends 6 1/2 weeks of radiotherapy to the right breast and regional nodes . We will see her back about 2 weeks after surgery to discuss the simulation process and anticipate we starting radiotherapy about 4-6 weeks after surgery.  2. Contraceptive Counseling. The patient has had a hysterectomy and does not need pregnancy screening. 3. Recent wound dehiscence following pericardial window with surgical wound. She is healing well and the wound vac is no longer in place. She sees Dr. Cyndia Bent today and we will have her return for simulation if she's healed in about 2 weeks. 4. History of Covid 19. The patient was diagnosed more than a month ago now. She is clear to proceed with treatments in the cancer center.   Given current concerns for patient exposure during the COVID-19 pandemic, this encounter was conducted via telephone.  The patient has given verbal consent for this type of encounter. The time spent during this encounter was 45 minutes and 50% of that time was spent in the coordination of her care. The attendants for this meeting include Dr. Lisbeth Renshaw, Shona Simpson, [facility] and [name]  During the encounter, Dr. Lisbeth Renshaw and  Shona Simpson [facility] were located at [facility] Radiation Oncology Department.  Veronica [name]  was located at home.   The above documentation reflects my direct findings during this shared patient visit. Please see the separate note by Dr. Lisbeth Renshaw on this date for the remainder of  the patient's plan of care.    Carola Rhine, PAC

## 2019-10-31 ENCOUNTER — Ambulatory Visit
Admission: RE | Admit: 2019-10-31 | Discharge: 2019-10-31 | Disposition: A | Discharge status: Home / Self Care | Payer: 59 | Source: Ambulatory Visit | Attending: Radiation Oncology | Admitting: Radiation Oncology

## 2019-10-31 ENCOUNTER — Other Ambulatory Visit: Payer: Self-pay

## 2019-10-31 ENCOUNTER — Encounter: Payer: Self-pay | Admitting: Surgery

## 2019-10-31 ENCOUNTER — Ambulatory Visit (INDEPENDENT_AMBULATORY_CARE_PROVIDER_SITE_OTHER): Payer: Self-pay | Admitting: Surgery

## 2019-10-31 ENCOUNTER — Encounter: Payer: Self-pay | Admitting: Radiation Oncology

## 2019-10-31 VITALS — BP 127/83 | HR 84 | Temp 97.7°F | Resp 18 | Ht 61.0 in | Wt 211.0 lb

## 2019-10-31 DIAGNOSIS — Z09 Encounter for follow-up examination after completed treatment for conditions other than malignant neoplasm: Secondary | ICD-10-CM

## 2019-10-31 DIAGNOSIS — T8131XD Disruption of external operation (surgical) wound, not elsewhere classified, subsequent encounter: Secondary | ICD-10-CM

## 2019-10-31 DIAGNOSIS — C50411 Malignant neoplasm of upper-outer quadrant of right female breast: Secondary | ICD-10-CM

## 2019-10-31 NOTE — Addendum Note (Signed)
Encounter addended by: Sherrlyn Hock, LPN on: [id number] PM  Actions taken: External Videoconference Connected, External Videoconference Disconnected

## 2019-10-31 NOTE — Progress Notes (Signed)
    HPI: Patient returns for routine postoperative follow-up having undergone emergent subxiphoid pericardial window for drainage of hemopericardium with tamponade on 10/02/2019. The patient's early postoperative recovery while in the hospital was notable for development of wound dehiscence requiring minimal debridement and placement of a wound VAC on 10/16/2019.Marland Kitchen Since hospital discharge the patient reports that she has been feeling well.  She was discharged home with a wound VAC being changed by home health nursing.  For the past few days she has been doing wet-to-dry dressing changes instead of the wound VAC due to some blistering of the surrounding skin.  She said that her right arm feels much better and the swelling has decreased.   Current Outpatient Medications  Medication Sig Dispense Refill  . Cholecalciferol (VITAMIN D3) 25 MCG (1000 UT) CHEW Chew 1 tablet by mouth daily.     . metoprolol tartrate (LOPRESSOR) 25 MG tablet Take 1 tablet (25 mg total) by mouth 2 (two) times daily. 60 tablet 0  . Rivaroxaban 15 & 20 MG TBPK Follow package directions: Take one 15mg  tablet by mouth twice a day. On day 22, switch to one 20mg  tablet once a day. Take with food. 51 each 0  . [START ON 11/19/2019] rivaroxaban (XARELTO) 20 MG TABS tablet Take 1 tablet (20 mg total) by mouth daily with supper. (Patient not taking: Reported on 10/31/2019) 30 tablet 3   No current facility-administered medications for this visit.   Physical Exam:  BP 127/83 (BP Location: Left Arm, Patient Position: Sitting, Cuff Size: Large)   Pulse 84   Temp 97.7 F (36.5 C)   Resp 18   Ht 5\' 1"  (1.549 m)   Wt 211 lb (95.7 kg)   SpO2 97% Comment: ON RA  BMI 39.87 kg/m  She looks well. She has mild residual swelling in the right upper extremity. The epigastric wound is very clean and granulating.  It is about half as deep as it was when the wound VAC was placed.  There is no sign of infection or surrounding  cellulitis.  Diagnostic Tests:  None today  Impression:  The wound appears to be healing well.  It is probably too superficial now for a wound VAC and we will change to normal saline wet-to-dry dressing changes daily.  Her right upper extremity swelling is much improved and she continues on Xarelto.  Plan:  She will return to see me in 3 weeks for follow-up.   Gaye Pollack, MD [facility] [phone]

## 2019-11-01 ENCOUNTER — Ambulatory Visit: Payer: 59 | Admitting: Radiation Oncology

## 2019-11-05 ENCOUNTER — Encounter: Payer: Self-pay | Admitting: *Deleted

## 2019-11-06 ENCOUNTER — Ambulatory Visit: Payer: 59

## 2019-11-06 ENCOUNTER — Other Ambulatory Visit: Payer: 59

## 2019-11-07 ENCOUNTER — Ambulatory Visit: Payer: 59 | Admitting: Surgery

## 2019-11-13 ENCOUNTER — Other Ambulatory Visit: Payer: 59

## 2019-11-13 ENCOUNTER — Ambulatory Visit: Payer: 59

## 2019-11-13 ENCOUNTER — Ambulatory Visit: Payer: 59 | Admitting: Oncology

## 2019-11-14 ENCOUNTER — Telehealth: Payer: Self-pay | Admitting: *Deleted

## 2019-11-14 NOTE — Telephone Encounter (Signed)
Spoke with the patient to let her know that we would be pushing her simulation appointment out about 2 weeks to allow her to completely heal.  I let her know to expect a phone call from the sim department with her new day and time.  When it is closer to her appointment and her skin is still not healed I asked that she give [place] a call and we would continue to hold on the sim.  She verbalized understanding.  Will continue to follow as necessary.  Gloriajean Dell. Leonie Green, BSN

## 2019-11-15 ENCOUNTER — Ambulatory Visit: Payer: 59 | Admitting: Radiation Oncology

## 2019-11-15 ENCOUNTER — Inpatient Hospital Stay: Payer: 59 | Attending: Oncology

## 2019-11-15 ENCOUNTER — Other Ambulatory Visit: Payer: Self-pay

## 2019-11-15 DIAGNOSIS — I2699 Other pulmonary embolism without acute cor pulmonale: Secondary | ICD-10-CM | POA: Diagnosis not present

## 2019-11-15 DIAGNOSIS — I871 Compression of vein: Secondary | ICD-10-CM | POA: Diagnosis not present

## 2019-11-15 DIAGNOSIS — I82623 Acute embolism and thrombosis of deep veins of upper extremity, bilateral: Secondary | ICD-10-CM | POA: Diagnosis not present

## 2019-11-15 DIAGNOSIS — Z8616 Personal history of COVID-19: Secondary | ICD-10-CM | POA: Diagnosis not present

## 2019-11-15 DIAGNOSIS — C773 Secondary and unspecified malignant neoplasm of axilla and upper limb lymph nodes: Secondary | ICD-10-CM | POA: Insufficient documentation

## 2019-11-15 DIAGNOSIS — Z170 Estrogen receptor positive status [ER+]: Secondary | ICD-10-CM | POA: Diagnosis not present

## 2019-11-15 DIAGNOSIS — C50411 Malignant neoplasm of upper-outer quadrant of right female breast: Secondary | ICD-10-CM | POA: Diagnosis present

## 2019-11-15 LAB — CMP (CANCER CENTER ONLY)
ALT: 30 U/L (ref 0–44)
AST: 28 U/L (ref 15–41)
Albumin: 4 g/dL (ref 3.5–5.0)
Alkaline Phosphatase: 69 U/L (ref 38–126)
Anion gap: 9 (ref 5–15)
BUN: 10 mg/dL (ref 6–20)
CO2: 25 mmol/L (ref 22–32)
Calcium: 9.8 mg/dL (ref 8.9–10.3)
Chloride: 105 mmol/L (ref 98–111)
Creatinine: 0.74 mg/dL (ref 0.44–1.00)
GFR, Est AFR Am: >60 mL/min (ref >60)
GFR, Estimated: >60 mL/min (ref >60)
Glucose, Bld: 137 mg/dL — ABNORMAL HIGH (ref 70–99)
Potassium: 4.4 mmol/L (ref 3.5–5.1)
Sodium: 139 mmol/L (ref 135–145)
Total Bilirubin: 1.4 mg/dL — ABNORMAL HIGH (ref 0.3–1.2)
Total Protein: 7.1 g/dL (ref 6.5–8.1)

## 2019-11-15 LAB — CBC WITH DIFFERENTIAL (CANCER CENTER ONLY)
Abs Immature Granulocytes: 0.04 10*3/uL (ref 0.00–0.07)
Basophils Absolute: 0 10*3/uL (ref 0.0–0.1)
Basophils Relative: 1 %
Eosinophils Absolute: 0.2 10*3/uL (ref 0.0–0.5)
Eosinophils Relative: 2 %
HCT: 42.4 % (ref 36.0–46.0)
Hemoglobin: 13.8 g/dL (ref 12.0–15.0)
Immature Granulocytes: 1 %
Lymphocytes Relative: 11 %
Lymphs Abs: 0.9 10*3/uL (ref 0.7–4.0)
MCH: 28.8 pg (ref 26.0–34.0)
MCHC: 32.5 g/dL (ref 30.0–36.0)
MCV: 88.5 fL (ref 80.0–100.0)
Monocytes Absolute: 0.6 10*3/uL (ref 0.1–1.0)
Monocytes Relative: 7 %
Neutro Abs: 6.5 10*3/uL (ref 1.7–7.7)
Neutrophils Relative %: 78 %
Platelet Count: 204 10*3/uL (ref 150–400)
RBC: 4.79 MIL/uL (ref 3.87–5.11)
RDW: 13.1 % (ref 11.5–15.5)
WBC Count: 8.3 10*3/uL (ref 4.0–10.5)
nRBC: 0 % (ref 0.0–0.2)

## 2019-11-16 ENCOUNTER — Telehealth (HOSPITAL_COMMUNITY): Payer: Self-pay

## 2019-11-16 LAB — FOLLICLE STIMULATING HORMONE: FSH: 52.4 m[IU]/mL

## 2019-11-16 NOTE — Telephone Encounter (Signed)

## 2019-11-18 ENCOUNTER — Other Ambulatory Visit: Payer: Self-pay

## 2019-11-18 DIAGNOSIS — I871 Compression of vein: Secondary | ICD-10-CM

## 2019-11-19 ENCOUNTER — Encounter: Payer: Self-pay | Admitting: Surgery

## 2019-11-19 ENCOUNTER — Ambulatory Visit (INDEPENDENT_AMBULATORY_CARE_PROVIDER_SITE_OTHER): Payer: Self-pay | Admitting: Surgery

## 2019-11-19 ENCOUNTER — Ambulatory Visit (HOSPITAL_COMMUNITY)
Admission: RE | Admit: 2019-11-19 | Discharge: 2019-11-19 | Disposition: A | Discharge status: Home / Self Care | Payer: 59 | Source: Ambulatory Visit | Attending: Surgery | Admitting: Surgery

## 2019-11-19 ENCOUNTER — Other Ambulatory Visit: Payer: Self-pay

## 2019-11-19 VITALS — BP 138/79 | HR 65 | Temp 97.7°F | Resp 20 | Ht 61.0 in | Wt 206.8 lb

## 2019-11-19 DIAGNOSIS — I871 Compression of vein: Secondary | ICD-10-CM

## 2019-11-19 NOTE — Progress Notes (Signed)
[facility]  Patient name: Veronica [name] MRN: [id number] DOB: 1970-10-20 Sex: female   REASON FOR VISIT:    Follow up  HISOTRY OF PRESENT ILLNESS:   Veronica [name] is a 49 y.o. female, who presented to the ER in December 2020 with facial and upper extremity swelling for several days.  CTA revealed  an occluded SVC.  She has a port in place for chemo for breast cancer.  She is currently ongoing treatment.  She tested positive for COVID.  She was saturating 100% on room air.  She was admitted and placed on IV heparin.  Her neck and face swelling improved with heparin however her arms remained edematous.   Because of her symptoms, I took her to the operating room on 10/02/2019 and perform mechanical thrombectomy of the right subclavian and brachiocephalic vein as well as the superior vena cava.  I also removed her right internal jugular Port-A-Cath.  She became somewhat unstable with hypotension.  I had a transesophageal echo in place and we noticed an enlarging pericardial effusion.  Dr. Cyndia Bent came in and performed an emergency pericardial window.  She has recovered nicely from her operation.  She is still healing her midline incision.  She states that her arm swelling is almost nearly resolved and she has no further head or neck swelling.   PAST MEDICAL HISTORY:   Past Medical History:  Diagnosis Date  . Cancer ([place])   . Chronic low back pain with left-sided sciatica   . Family history of leukemia   . Family history of stomach cancer   . Hypertension      FAMILY HISTORY:   Family History  Problem Relation Age of Onset  . Arthritis Mother   . COPD Mother   . Hypertension Mother   . Hyperlipidemia Mother   . Leukemia Brother 32  . Stomach cancer Maternal Grandmother        late 19s    SOCIAL HISTORY:   Social History   Tobacco Use  . Smoking status: Never Smoker  . Smokeless tobacco: Never Used  Substance  Use Topics  . Alcohol use: Yes    Alcohol/week: 2.0 standard drinks    Types: 2 Standard drinks or equivalent per week    Comment: "2-3 a week"     ALLERGIES:   No Known Allergies   CURRENT MEDICATIONS:   Current Outpatient Medications  Medication Sig Dispense Refill  . Cholecalciferol (VITAMIN D3) 25 MCG (1000 UT) CHEW Chew 1 tablet by mouth daily.     . metoprolol tartrate (LOPRESSOR) 25 MG tablet Take 1 tablet (25 mg total) by mouth 2 (two) times daily. 60 tablet 0  . rivaroxaban (XARELTO) 20 MG TABS tablet Take 1 tablet (20 mg total) by mouth daily with supper. 30 tablet 3   No current facility-administered medications for this visit.    REVIEW OF SYSTEMS:   [X]  denotes positive finding, [ ]  denotes negative finding Cardiac  Comments:  Chest pain or chest pressure:    Shortness of breath upon exertion:    Short of breath when lying flat:    Irregular heart rhythm:        Vascular    Pain in calf, thigh, or hip brought on by ambulation:    Pain in feet at night that wakes you up from your sleep:     Blood clot in your veins:    Leg swelling:  Pulmonary    Oxygen at home:    Productive cough:     Wheezing:         Neurologic    Sudden weakness in arms or legs:     Sudden numbness in arms or legs:     Sudden onset of difficulty speaking or slurred speech:    Temporary loss of vision in one eye:     Problems with dizziness:         Gastrointestinal    Blood in stool:     Vomited blood:         Genitourinary    Burning when urinating:     Blood in urine:        Psychiatric    Major depression:         Hematologic    Bleeding problems:    Problems with blood clotting too easily:        Skin    Rashes or ulcers:        Constitutional    Fever or chills:      PHYSICAL EXAM:   Vitals:   11/19/19 1509  BP: 138/79  Pulse: 65  Resp: 20  Temp: 97.7 F (36.5 C)  SpO2: 100%  Weight: 206 lb 12.8 oz (93.8 kg)  Height: 5\' 1"  (1.549 m)     GENERAL: The patient is a well-nourished female, in no acute distress. The vital signs are documented above. CARDIAC: There is a regular rate and rhythm.  VASCULAR: Minimal upper extremity edema PULMONARY: Non-labored respirations ABDOMEN: Open midline incision with granulation tissue MUSCULOSKELETAL: There are no major deformities or cyanosis. NEUROLOGIC: No focal weakness or paresthesias are detected. SKIN: There are no ulcers or rashes noted. PSYCHIATRIC: The patient has a normal affect.  STUDIES:   I have reviewed the following:  Right:  Findings consistent with chronic deep vein thrombosis involving the right  internal jugular vein and right axillary vein. The axillary vein is  partially  compressible consistent with chronic, recanalized thrombus.    Left:  No evidence of deep vein thrombosis in the upper extremity. No evidence of  superficial vein thrombosis in the upper extremity.   MEDICAL ISSUES:   Occlusion of the SVC: Patient superior vena cava has been recanalized.  There is still evidence of chronic thrombus in the right internal jugular and axillary veins.  The left side is clear.  I told the patient I would like her to remain on anticoagulation as long as she is receiving hormonal therapy and treatment for her cancer.  This potentially could be lifelong.  I would discourage her from having central venous access if additional rounds of chemotherapy are required in the future.  I have her scheduled for follow-up with me in 6 months with repeat ultrasound.    Leia Alf, MD, FACS [facility] [phone] Pager [phone]

## 2019-11-20 ENCOUNTER — Encounter: Payer: Self-pay | Admitting: *Deleted

## 2019-11-20 ENCOUNTER — Other Ambulatory Visit: Payer: Self-pay | Admitting: *Deleted

## 2019-11-20 ENCOUNTER — Ambulatory Visit: Payer: 59

## 2019-11-20 ENCOUNTER — Other Ambulatory Visit: Payer: 59

## 2019-11-20 DIAGNOSIS — I871 Compression of vein: Secondary | ICD-10-CM

## 2019-11-20 LAB — ESTRADIOL, ULTRA SENS: Estradiol, Sensitive: 10.4 pg/mL

## 2019-11-21 ENCOUNTER — Other Ambulatory Visit: Payer: Self-pay

## 2019-11-21 ENCOUNTER — Encounter: Payer: Self-pay | Admitting: Surgery

## 2019-11-21 ENCOUNTER — Ambulatory Visit (INDEPENDENT_AMBULATORY_CARE_PROVIDER_SITE_OTHER): Payer: Self-pay | Admitting: Surgery

## 2019-11-21 VITALS — BP 132/86 | HR 77 | Temp 97.7°F | Resp 20 | Ht 61.0 in | Wt 206.0 lb

## 2019-11-21 DIAGNOSIS — T8131XD Disruption of external operation (surgical) wound, not elsewhere classified, subsequent encounter: Secondary | ICD-10-CM

## 2019-11-21 DIAGNOSIS — Z09 Encounter for follow-up examination after completed treatment for conditions other than malignant neoplasm: Secondary | ICD-10-CM

## 2019-11-21 NOTE — Progress Notes (Signed)
     HPI:  The patient returns today for follow-up of her epigastric wound status post subxiphoid pericardial window with postoperative wound breakdown.  She has been doing normal saline wet-to-dry dressings daily at home.  She said that she feels well overall.  Current Outpatient Medications  Medication Sig Dispense Refill  . Cholecalciferol (VITAMIN D3) 25 MCG (1000 UT) CHEW Chew 1 tablet by mouth daily.     . metoprolol tartrate (LOPRESSOR) 25 MG tablet Take 1 tablet (25 mg total) by mouth 2 (two) times daily. 60 tablet 0  . rivaroxaban (XARELTO) 20 MG TABS tablet Take 1 tablet (20 mg total) by mouth daily with supper. 30 tablet 3   No current facility-administered medications for this visit.     Physical Exam: BP 132/86   Pulse 77   Temp 97.7 F (36.5 C) (Skin)   Resp 20   Ht 5\' 1"  (1.549 m)   Wt 206 lb (93.4 kg)   SpO2 98% Comment: RA  BMI 38.92 kg/m  She looks well. Cardiac exam shows a regular rate and rhythm with normal heart sounds. Lung exam is clear. The epigastric wound is superficial with some hypertrophic granulation tissue present.  The tissue is clean.  It does not track deeply.  There is no drainage.  Diagnostic Tests:  None today  Impression:  The wound is healing in with wet-to-dry dressings and I would like estimate that it would take about 3 more weeks to completely heal.  Plan:  She will continue daily wet-to-dry dressing changes with normal saline and I will see her back for wound check in 3 weeks.   Gaye Pollack, MD [facility] [phone]

## 2019-11-26 ENCOUNTER — Other Ambulatory Visit: Payer: Self-pay

## 2019-11-26 ENCOUNTER — Ambulatory Visit (INDEPENDENT_AMBULATORY_CARE_PROVIDER_SITE_OTHER): Payer: Self-pay | Admitting: Physician Assistant

## 2019-11-26 ENCOUNTER — Encounter: Payer: Self-pay | Admitting: Physician Assistant

## 2019-11-26 VITALS — BP 124/78 | HR 68 | Temp 97.5°F | Resp 16 | Ht 61.0 in | Wt 206.0 lb

## 2019-11-26 DIAGNOSIS — T8131XD Disruption of external operation (surgical) wound, not elsewhere classified, subsequent encounter: Secondary | ICD-10-CM

## 2019-11-26 DIAGNOSIS — Z09 Encounter for follow-up examination after completed treatment for conditions other than malignant neoplasm: Secondary | ICD-10-CM

## 2019-11-26 NOTE — Patient Instructions (Signed)
Pack open wound with a dry 2X2 gauze daily and PRN.

## 2019-11-26 NOTE — Progress Notes (Signed)
  HPI: Patient is a 49 year old female with history of breast cancer and SVC syndrome.  She was admitted to the hospital in December 2020 with an acute pulmonary embolus.  She underwent percutaneous thrombectomy by the vascular service and subsequently developed a large pericardial effusion with tamponade.  She was taken to the operating room emergently by Dr. Mohammed Kindle where pericardial window was performed with evacuation of the 900 mill liter bloody effusion.  Subsequent to that procedure, she developed a breakdown of the subxiphoid wound.  This was managed with debridement in the operating room followed by placement of a wound VAC.  After it had healed sufficiently, the wound VAC was converted to saline moist to dry dressings.  She has been followed in the office.  She was last seen in 5 days ago at which time Dr. Laurene Footman description was  "the wound superficial with some hypertrophic granulation tissue present.  The tissue is clean.  It does not track deeply.  There is no drainage". She returns today after developing a "blister" at the top of the incision which had already healed.  She denies any pain or fever.      Current Outpatient Medications  Medication Sig Dispense Refill  . Cholecalciferol (VITAMIN D3) 25 MCG (1000 UT) CHEW Chew 1 tablet by mouth daily.     . metoprolol tartrate (LOPRESSOR) 25 MG tablet Take 1 tablet (25 mg total) by mouth 2 (two) times daily. 60 tablet 0  . rivaroxaban (XARELTO) 20 MG TABS tablet Take 1 tablet (20 mg total) by mouth daily with supper. 30 tablet 3   No current facility-administered medications for this visit.    Physical Exam   VS: T  97.57F P  68 RR  16 BP  124/78 SpO2  100%  The sub-xyphoid wound has soft, hypertrophic granulation on the lower end. There is a 1cm x 2cm thin blister at the top end of the wound with surrounding erythema. There is no significant erythema. The blister was carefully incised with scissors along its length. A small  amount of clear fluid drained out. Using sterile forceps, the wound was probed and noted to track about 1 1/2 cm straight in on the superior end. The site was hemostatic.   The corner of a dry 2x2 gauze was packed into the wound a then it was covered with a dry dressing and secured.       Impression / Plan: Superficial fluid collection at site of previous sub-xyphoid pericardial window.  The fluid was drained and does not appear infected. This may be some on-going fat necrosis.  Instructed Ms. Jia to do dressing changes to this site daily and prn with 2x2 gauze packing and we will follow closely in the office. I explained that further debridement may be required if this scenario recurs.     Antony Odea, PA-C [facility] [phone]

## 2019-11-27 ENCOUNTER — Other Ambulatory Visit: Payer: 59

## 2019-11-27 ENCOUNTER — Ambulatory Visit: Payer: 59

## 2019-11-27 ENCOUNTER — Ambulatory Visit: Payer: 59 | Admitting: Adult Health

## 2019-11-29 ENCOUNTER — Ambulatory Visit: Payer: 59 | Admitting: Radiation Oncology

## 2019-11-30 ENCOUNTER — Ambulatory Visit: Payer: 59

## 2019-12-03 ENCOUNTER — Other Ambulatory Visit: Payer: Self-pay

## 2019-12-03 ENCOUNTER — Ambulatory Visit: Admission: RE | Admit: 2019-12-03 | Payer: 59 | Source: Ambulatory Visit | Admitting: Radiation Oncology

## 2019-12-03 ENCOUNTER — Ambulatory Visit: Payer: 59 | Admitting: Radiation Oncology

## 2019-12-03 ENCOUNTER — Ambulatory Visit (INDEPENDENT_AMBULATORY_CARE_PROVIDER_SITE_OTHER): Payer: Self-pay | Admitting: Surgical

## 2019-12-03 VITALS — BP 130/84 | HR 69 | Temp 97.5°F | Resp 20 | Ht 61.0 in | Wt 206.0 lb

## 2019-12-03 DIAGNOSIS — T8131XD Disruption of external operation (surgical) wound, not elsewhere classified, subsequent encounter: Secondary | ICD-10-CM

## 2019-12-03 DIAGNOSIS — T8130XA Disruption of wound, unspecified, initial encounter: Secondary | ICD-10-CM

## 2019-12-03 DIAGNOSIS — Z09 Encounter for follow-up examination after completed treatment for conditions other than malignant neoplasm: Secondary | ICD-10-CM

## 2019-12-03 NOTE — Progress Notes (Signed)
Veronica [name]  [phone] [phone]     ID: Veronica [name] DOB: 11-Dec-1970  MR#: [id number]  PYY#:[id number]  Patient Care Team: Blair Heys, Hershal Coria as PCP - General (Physician Assistant) Mauro Kaufmann, RN as Oncology Nurse Navigator [name], RN as Oncology Nurse Navigator Erroll Luna, MD as Consulting Physician (General Surgery) Jaquise Faux, Virgie Dad, MD as Consulting Physician (Oncology) Kyung Rudd, MD as Consulting Physician (Radiation Oncology) Gaye Pollack, MD as Consulting Physician (Cardiothoracic Surgery) Chauncey Cruel, MD OTHER MD:  CHIEF COMPLAINT: Estrogen receptor positive breast cancer  CURRENT TREATMENT:  adjuvant radiation therapy   INTERVAL HISTORY: Veronica [name] returns today for follow-up of her estrogen receptor positive breast cancer.  The interval history has been very rocky, with DVT/PEs in the setting of COVID-19 positivity, with port-associated clot, superior vena cava syndrome, then tamponade secondary to bleeding necessitating window placement, then wound dehiscence requiring a wound VAC.Marland Kitchen  She was finally discharged 10/19/2019.  She was referred back to Dr. Lisbeth Renshaw on 10/31/2019 for radiation therapy. She is scheduled for simulation 12/17/2019.  REVIEW OF SYSTEMS: Veronica [name] is finally recovering from her RSD. She is "feeling good". She is taking walks outside as often as she can. She is tolerating the Xarelto well, with no overt bleeding. The mid chest wound has almost completely healed over and this will allow her to finally start her radiation treatments. She still has some clot in the right upper extremity system but no clinical lymphedema. She is now in menopause, but aside from mild hot flashes at night she has no symptoms related to this. Detailed review of systems was otherwise stable.   HISTORY OF CURRENT ILLNESS: From the original intake note:  Veronica [name] had routine screening mammography on 04/27/2019 showing  a possible abnormality in the right breast. She underwent right diagnostic mammography with tomography and right breast ultrasonography at [facility] on 05/01/2019 showing: breast density category C; highly suspicious 1.7 cm irregular mass in the right breast at 10 o'clock, 8 cm from the nipple; indeterminate hypoechoic round mass in the right axillary tail/low axilla. Physical exam showed no suspicious lumps.  Accordingly on 05/02/2019 she proceeded to biopsy of the right breast area in question. The pathology from this procedure ([id number]) showed: invasive ductal carcinoma, grade 3; ductal carcinoma in situ; lymphovascular invasion present. Prognostic indicators significant for: estrogen receptor, 30% positive with moderate staining intensity, and progesterone receptor, 30% positive with strong staining intensity. Proliferation marker Ki67 at 20%. HER2 negative by immunohistochemistry (1+).  The second "satellite" lesion was a lymph node which was also positive.  The patient's subsequent history is as detailed below.   PAST MEDICAL HISTORY: Past Medical History:  Diagnosis Date  . Cancer ([place])   . Chronic low back pain with left-sided sciatica   . Family history of leukemia   . Family history of stomach cancer   . Hypertension     PAST SURGICAL HISTORY: Past Surgical History:  Procedure Laterality Date  . APPLICATION OF WOUND VAC N/A 10/16/2019   Procedure: APPLICATION OF WOUND VAC;  Surgeon: Gaye Pollack, MD;  Location: [facility];  Service: Vascular;  Laterality: N/A;  . BREAST BIOPSY Right 05/02/2019   right clips X2  . BREAST LUMPECTOMY WITH RADIOACTIVE SEED AND SENTINEL LYMPH NODE BIOPSY Right 06/12/2019   Procedure: RIGHT BREAST RADIOACTIVE SEED LUMPECTOMY  AND RIGHT AXILLARY SEED TARGETED LYMPH NODE AND AXILLARY SENTINEL LYMPH NODE MAPPING;  Surgeon: Erroll Luna, MD;  Location: [facility];  Service: General;  Laterality: Right;  PEC BLOCK  . C/S x2  '98 '03    . CENTRAL VENOUS CATHETER INSERTION  10/02/2019   Procedure: Insertion Central Line Adult;  Surgeon: Serafina Mitchell, MD;  Location: [facility];  Service: Vascular;;  . CESAREAN SECTION    . CHOLECYSTECTOMY    . I & D EXTREMITY N/A 10/16/2019   Procedure: DEBRIDEMENT of DEHISCED MIDLINE SURGICAL INCISION;  Surgeon: Gaye Pollack, MD;  Location: [facility];  Service: Vascular;  Laterality: N/A;  . LAPAROSCOPIC ASSISTED VAGINAL HYSTERECTOMY  05/17/2011   Procedure: LAPAROSCOPIC ASSISTED VAGINAL HYSTERECTOMY;  Surgeon: Sharene Butters;  Location: [facility];  Service: Gynecology;  Laterality: N/A;  Laparoscopic Assisted Vaginal Hysterectomy With Lysis Of Adhesions  . PERICARDIAL WINDOW N/A 10/02/2019   Procedure: Pericardial Window;  Surgeon: Serafina Mitchell, MD;  Location: [facility];  Service: Vascular;  Laterality: N/A;  . PORT-A-CATH REMOVAL  10/02/2019   Procedure: Removal Port-A-Cath;  Surgeon: Serafina Mitchell, MD;  Location: [facility];  Service: Vascular;;  . PORTACATH PLACEMENT Right 06/12/2019   Procedure: INSERTION PORT-A-CATH WITH ULTRASOUND;  Surgeon: Erroll Luna, MD;  Location: [facility];  Service: General;  Laterality: Right;  . RE-EXCISION OF BREAST LUMPECTOMY Right 07/17/2019   Procedure: RE-EXCISION OF RIGHT BREAST LUMPECTOMY;  Surgeon: Erroll Luna, MD;  Location: [facility];  Service: General;  Laterality: Right;  . TUBAL LIGATION    . ULTRASOUND GUIDANCE FOR VASCULAR ACCESS  10/02/2019   Procedure: Ultrasound Guidance For Vascular Access;  Surgeon: Serafina Mitchell, MD;  Location: [facility];  Service: Vascular;;  . VENOGRAM N/A 10/02/2019   Procedure: Central VENOGRAM;  Surgeon: Serafina Mitchell, MD;  Location: [facility];  Service: Vascular;  Laterality: N/A;    FAMILY HISTORY: Family History  Problem Relation Age of Onset  . Arthritis Mother   . COPD Mother   . Hypertension Mother   . Hyperlipidemia Mother   . Leukemia Brother 26  . Stomach cancer Maternal  Grandmother        late 25s   Patient's parents are living (as of September 2020). Her father is 65 and her mother is 31 as of 04/2019. The patient denies a family hx of breast or ovarian cancer. She has 7 siblings, 6 brothers and 1 sister. She reports her maternal grandmother was diagnosed with stomach cancer at age 49. Her brother was diagnosed with leukemia at age 15.   GYNECOLOGIC HISTORY:  No LMP recorded. Patient has had a hysterectomy. Menarche: 49 years old Age at first live birth: 49 years old GX P 2 (+1 stillborn) LMP age 108-42 Contraceptive: unsure, maybe for a year at age 75. HRT no Hysterectomy? Yes, 05/2011 BSO? 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 She is married. Husband Veronica [name]") is a Hydrologist for a telephone company. She lives at home with her husband and two daughters. Daughter Veronica [name], age 10, is a Scientist, water quality. Daughter Veronica [name], age 7, is a Ship broker.     ADVANCED DIRECTIVES: Husband Veronica [name] is her HCPOA.   HEALTH MAINTENANCE: Social History   Tobacco Use  . Smoking status: Never Smoker  . Smokeless tobacco: Never Used  Substance Use Topics  . Alcohol use: Yes    Alcohol/week: 2.0 standard drinks    Types: 2 Standard drinks or equivalent per week    Comment: "2-3 a week"  . Drug use: No  Colonoscopy: n/a  PAP: 02/2018  Bone density: n/a   No Known Allergies  Current Outpatient Medications  Medication Sig Dispense Refill  . Cholecalciferol (VITAMIN D3) 25 MCG (1000 UT) CHEW Chew 1 tablet by mouth daily.     . metoprolol tartrate (LOPRESSOR) 25 MG tablet Take 1 tablet (25 mg total) by mouth 2 (two) times daily. 60 tablet 0  . rivaroxaban (XARELTO) 20 MG TABS tablet Take 1 tablet (20 mg total) by mouth daily with supper. 30 tablet 3   No current facility-administered medications for this visit.    OBJECTIVE: Young white woman who appears stated age  32:   12/04/19 1520  BP: 123/79    Pulse: 70  Resp: 18  Temp: 97.8 F (36.6 C)  SpO2: 100%   Wt Readings from Last 3 Encounters:  12/04/19 205 lb 11.2 oz (93.3 kg)  12/03/19 206 lb (93.4 kg)  11/26/19 206 lb (93.4 kg)   Body mass index is 38.87 kg/m.    ECOG FS:1 - Symptomatic but completely ambulatory  Sclerae unicteric, EOMs intact Wearing a mask No cervical or supraclavicular adenopathy Lungs no rales or rhonchi Heart regular rate and rhythm Abd soft, nontender, positive bowel sounds MSK no focal spinal tenderness, no upper extremity lymphedema Neuro: nonfocal, well oriented, appropriate affect Breasts: The right breast is status post lumpectomy. There is no skin or nipple change of concern and no palpable mass. The left breast is benign. Both axillae are benign. Skin: The suprasternal wound is healing by secondary intention with a 0.5 x 3 cm residual area of dehiscence  LAB RESULTS:  CMP     Component Value Date/Time   NA 139 11/15/2019 1129   K 4.4 11/15/2019 1129   CL 105 11/15/2019 1129   CO2 25 11/15/2019 1129   GLUCOSE 137 (H) 11/15/2019 1129   BUN 10 11/15/2019 1129   CREATININE 0.74 11/15/2019 1129   CALCIUM 9.8 11/15/2019 1129   PROT 7.1 11/15/2019 1129   ALBUMIN 4.0 11/15/2019 1129   AST 28 11/15/2019 1129   ALT 30 11/15/2019 1129   ALKPHOS 69 11/15/2019 1129   BILITOT 1.4 (H) 11/15/2019 1129   GFRNONAA >60 11/15/2019 1129   GFRAA >60 11/15/2019 1129    No results found for: TOTALPROTELP, ALBUMINELP, A1GS, A2GS, BETS, BETA2SER, GAMS, MSPIKE, SPEI  No results found for: KPAFRELGTCHN, LAMBDASER, KAPLAMBRATIO  Lab Results  Component Value Date   WBC 8.3 11/15/2019   NEUTROABS 6.5 11/15/2019   HGB 13.8 11/15/2019   HCT 42.4 11/15/2019   MCV 88.5 11/15/2019   PLT 204 11/15/2019   No results found for: LABCA2  No components found for: OEHOZY248  No results for input(s): INR in the last 168 hours.  No results found for: LABCA2  No results found for: GNO037  No results  found for: CWU889  No results found for: VQX450  No results found for: CA2729  No components found for: HGQUANT  No results found for: CEA1 / No results found for: CEA1   No results found for: AFPTUMOR  No results found for: CHROMOGRNA  No results found for: HGBA, HGBA2QUANT, HGBFQUANT, HGBSQUAN (Hemoglobinopathy evaluation)   Lab Results  Component Value Date   LDH 170 09/30/2019    No results found for: IRON, TIBC, IRONPCTSAT (Iron and TIBC)  Lab Results  Component Value Date   FERRITIN 498 (H) 10/19/2019    Urinalysis    Component Value Date/Time   COLORURINE YELLOW 08/28/2019 2326   APPEARANCEUR CLOUDY (A)  08/28/2019 2326   LABSPEC 1.013 08/28/2019 2326   PHURINE 5.0 08/28/2019 2326   GLUCOSEU NEGATIVE 08/28/2019 2326   HGBUR MODERATE (A) 08/28/2019 2326   BILIRUBINUR NEGATIVE 08/28/2019 2326   KETONESUR NEGATIVE 08/28/2019 2326   PROTEINUR NEGATIVE 08/28/2019 2326   NITRITE NEGATIVE 08/28/2019 2326   LEUKOCYTESUR MODERATE (A) 08/28/2019 2326     STUDIES: VAS [place] UPPER EXTREMITY VENOUS DUPLEX  Result Date: 11/19/2019 UPPER VENOUS STUDY  Indications: Swelling, and Edema Risk Factors: Surgery Veronica [name] mechanical thrombectomy of right subclavian , brachio-cephalic and SVC on 72/06/4708. Anticoagulation: Xarelto. Performing Technologist: Delorise Shiner RVT  Examination Guidelines: A complete evaluation includes B-mode imaging, spectral Doppler, color Doppler, and power Doppler as needed of all accessible portions of each vessel. Bilateral testing is considered an integral part of a complete examination. Limited examinations for reoccurring indications may be performed as noted.  Right Findings: +----------+------------+---------+-----------+-----------------------+-------+ RIGHT     CompressiblePhasicitySpontaneous      Properties       Summary +----------+------------+---------+-----------+-----------------------+-------+ IJV         Partial             recanalizedpartially re-cannulatedChronic +----------+------------+---------+-----------+-----------------------+-------+ Subclavian                         Yes                                   +----------+------------+---------+-----------+-----------------------+-------+ Axillary    Partial      Yes       Yes                           Chronic +----------+------------+---------+-----------+-----------------------+-------+ Brachial      Full                 Yes                                   +----------+------------+---------+-----------+-----------------------+-------+ Radial        Full                                                       +----------+------------+---------+-----------+-----------------------+-------+ Ulnar         Full                                                       +----------+------------+---------+-----------+-----------------------+-------+ Cephalic      Full                                                       +----------+------------+---------+-----------+-----------------------+-------+ Basilic       Full                                                       +----------+------------+---------+-----------+-----------------------+-------+  Right IJV is small and non compressible, with a second, patent collateral coursing medial to a thyroid structure.  Left Findings: +----------+------------+---------+-----------+----------+-------+ LEFT      CompressiblePhasicitySpontaneousPropertiesSummary +----------+------------+---------+-----------+----------+-------+ IJV           Full       Yes       Yes                      +----------+------------+---------+-----------+----------+-------+ Subclavian               Yes       Yes                      +----------+------------+---------+-----------+----------+-------+ Axillary      Full       Yes       Yes                       +----------+------------+---------+-----------+----------+-------+ Brachial      Full                                          +----------+------------+---------+-----------+----------+-------+ Radial        Full                                          +----------+------------+---------+-----------+----------+-------+ Ulnar         Full                                          +----------+------------+---------+-----------+----------+-------+ Cephalic      Full                 Yes                      +----------+------------+---------+-----------+----------+-------+ Basilic       Full                 Yes                      +----------+------------+---------+-----------+----------+-------+  Summary:  Right: Findings consistent with chronic deep vein thrombosis involving the right internal jugular vein and right axillary vein. The axillary vein is partially compressible consistent with chronic, recanalized thrombus.  Left: No evidence of deep vein thrombosis in the upper extremity. No evidence of superficial vein thrombosis in the upper extremity.  *See table(s) above for measurements and observations.  Diagnosing physician: Harold Barban MD Electronically signed by Harold Barban MD on 11/19/2019 at 4:33:24 PM.    Final     ELIGIBLE FOR AVAILABLE RESEARCH PROTOCOL: AET  ASSESSMENT: 49 y.o. [place], [name] status post right breast upper outer quadrant biopsy 05/01/2019 for a clinical T1c N1, stage IIA invasive ductal carcinoma, grade 3, estrogen and progesterone receptor positive, HER-2 nonamplified, with an MIB-1-1 of 20%.  (a) mass in the axillary tail was a positive lymph node  (1) MammaPrint obtained from the original biopsy shows a high risk luminal B tumor and predicts a 5-year disease-free survival of 91% in her case  (2) genetics testing 05/09/2019 through the Common Hereditary Gene Panel offered by [facility] found no deleterious  mutations in APC, ATM, AXIN2,  BARD1, BMPR1A, BRCA1, BRCA2, BRIP1, CDH1, CDK4, CDKN2A (p14ARF), CDKN2A (p16INK4a), CHEK2, CTNNA1, DICER1, EPCAM (Deletion/duplication testing only), GREM1 (promoter region deletion/duplication testing only), KIT, MEN1, MLH1, MSH2, MSH3, MSH6, MUTYH, NBN, NF1, NHTL1, PALB2, PDGFRA, PMS2, POLD1, POLE, PTEN, RAD50, RAD51C, RAD51D, RNF43, SDHB, SDHC, SDHD, SMAD4, SMARCA4. STK11, TP53, TSC1, TSC2, and VHL.  The following genes were evaluated for sequence changes only: SDHA and HOXB13 c.251G>A variant only.   (a) A variant of uncertain significance (VUS) was detected in one of her MSH6 genes (c.831A>C).  (3) status post right lumpectomy and sentinel lymph node sampling 06/12/2019 for a pT2 pN1, stage IIA invasive ductal carcinoma, grade 2, with positive margins  (a) a total of 4 sentinel lymph nodes removed, one positive (with ECE), ine itc  (b) margin clearance 04/19/2019 successful medial margin close but negative for DCIS  (4) adjuvant chemotherapy consisting of doxorubicin and cyclophosphamide in dose dense fashion x4 starting 07/10/2019, completed 09/24/2019; planned weekly paclitaxel x12 omitted   (a) echo 06/26/2019 shows an EF of 60-65%  (b echo on 09/19/2019 shows an EF of 60-65%  (c) chemotherapy stopped after four cycles of doxorubicin and cyclophosphamide due to #5 below  (5) Multiple VTE/PE documented 10/02/2019, on intravenous heparin initially, then Xarelto started 10/19/2019  (a) presented with SVC syndrome and bilateral pulmonary emboli on 09/28/2019  (b) s/p SVC thrombectomy and port removal 44/31/5400 complicated by hemopericardium and tamponade, necessitating pericardial window placement   (c) postop course complicated by wound dehiscence requiring wound VAC  (c) Doppler 10/03/2019 showed right lower extremity DVT  (d) CT angio and Doppler of both upper extremities 10/13/2019 found persistent acute bilateral pulmonary emboli, persistent but improved SVC thrombus, bilateral pleural  effusions, and right lower lobe collapse. Bilateral upper extremity DVTs also noted  (e) Dopplers upper extremity 11/19/2019 showed clots cleared on the left, chronic DVT right internal jugular and axillary veins  (6) COVID-19 infection documented 09/27/2020, status post remdesivir  (7) adjuvant radiation to start 12/17/2019, status post remdesivir  (8) anastrozole to start at the completion of local treatment  (a) [place] and estradiol obtained 11/15/2019 consistent with menopause  (b) not a good candidate for tamoxifen given history of DVT/PE above   PLAN:  Veronica [name] has made an impressive recovery from her multiple clotting problems, the complications relating to their treatment, and her COVID-19 infection. She is exercising regularly and trying to regain her full prior functional status. Remarkably despite the upper extremity clot she has no lymphedema clinically. Her chest wall wound is finally healing over and she should be ready to start her radiation treatments as scheduled 12/17/2019.  Two effects of all of these problems are the fact that she did not receive her paclitaxel treatment. As discussed previously this would have provided an additional 3% risk reduction in terms of breast cancer recurrence. Her radiation also had to be delayed. Hopefully that will not significantly decrease its effectiveness at controlling the possibility of local recurrence.  On the favorable side her cancer is estrogen and progesterone receptor positive and she will receive antiestrogens for a minimum of 7 years. I am going to wait until she is done with radiation to start dose in the meantime I have encouraged her plan to exercise and improve her functional status  She knows to call for any other issue that may develop before her return visit here  Total encounter time 30 minutes.Sarajane Jews C. Julius Matus, MD Medical Oncology and Hematology [facility] [place],   [address] Tel. [phone]    Fax. [phone]   I, Wilburn Mylar, am acting as scribe for Dr. Virgie Dad. Adeoluwa Silvers.    *Total Encounter Time as defined by the Centers for Medicare and Medicaid Services includes, in addition to the face-to-face time of a patient visit (documented in the note above) non-face-to-face time: obtaining and reviewing outside history, ordering and reviewing medications, tests or procedures, care coordination (communications with other health care professionals or caregivers) and documentation in the medical record.

## 2019-12-03 NOTE — Progress Notes (Signed)
[address]       [place] [address]             [phone]      Laresa L Joines [place] Medical Record [id number] Date of Birth: 07/19/1971  Referring: Harold Hedge, MD Primary Care: Blair Heys, PA-C Primary Cardiologist: No primary care provider on file.   Chief Complaint:   POST OP FOLLOW UP 10/16/2019  Surgeon:  Gaye Pollack, MD  First Assistant: None   Preoperative Diagnosis:  Dehiscence of epigastric wound following subxyphoid pericardial window   Postoperative Diagnosis:  Same   Procedure:  Debridement of wound and placement of Wound VAC.  Anesthesia:  General Endotracheal   History of Present Illness:    The patient is a 49 year old female with a history of metastatic breast cancer and COVID-19 who presented with an SVC thrombus and occlusion treated with SVC thrombectomy by Dr. Trula Slade on 10/02/2019.  She subsequently developed a enlarging pericardial effusion which required pericardial window which was done by Dr. Cyndia Bent.  This was done on 10/02/2019.  She subsequently developed cellulitis of the wound which was treated with oral Keflex.  The wound subsequently dehisced and on 10/16/2019 she was taken back to the operating room for debridement and wound VAC placement.  We have followed her in the office and last week she required a little bit further debridement of a blister that developed.  She is seen in the office on today's date for follow-up and wound care.      Past Medical History:  Diagnosis Date  . Cancer ([place])   . Chronic low back pain with left-sided sciatica   . Family history of leukemia   . Family history of stomach cancer   . Hypertension      Social History   Tobacco Use  Smoking Status Never Smoker  Smokeless Tobacco Never Used    Social History   Substance and Sexual Activity  Alcohol Use Yes  . Alcohol/week: 2.0 standard drinks  . Types: 2 Standard drinks or equivalent per week   Comment: "2-3 a week"     No Known Allergies  Current Outpatient Medications  Medication Sig Dispense Refill  . Cholecalciferol (VITAMIN D3) 25 MCG (1000 UT) CHEW Chew 1 tablet by mouth daily.     . rivaroxaban (XARELTO) 20 MG TABS tablet Take 1 tablet (20 mg total) by mouth daily with supper. 30 tablet 3  . metoprolol tartrate (LOPRESSOR) 25 MG tablet Take 1 tablet (25 mg total) by mouth 2 (two) times daily. 60 tablet 0   No current facility-administered medications for this visit.       Physical Exam: BP 130/84   Pulse 69   Temp (!) 97.5 F (36.4 C) (Skin)   Resp 20   Ht 5\' 1"  (1.549 m)   Wt 93.4 kg   SpO2 100% Comment: RA  BMI 38.92 kg/m   General appearance: alert, appears stated age and no distress Wound: The incision appears to be granulating well.  There is no purulence.  There is no superficial erythema or cellulitis.   Diagnostic Studies & Laboratory data:     Recent Radiology Findings:   No results found.    Recent Lab Findings: Lab Results  Component Value Date   WBC 8.3 11/15/2019   HGB 13.8 11/15/2019   HCT 42.4 11/15/2019   PLT 204 11/15/2019   GLUCOSE 137 (H) 11/15/2019   TRIG 85 10/03/2019   ALT  30 11/15/2019   AST 28 11/15/2019   NA 139 11/15/2019   K 4.4 11/15/2019   CL 105 11/15/2019   CREATININE 0.74 11/15/2019   BUN 10 11/15/2019   CO2 25 11/15/2019   INR 2.2 (H) 10/23/2019      Assessment / Plan: There are 2 poles to this wound.  The upper pole is slightly deeper and I packed it with a corner of saline gauze.  The lower part of the incision has a pole that is granulating well but it is not deep enough for wound packing.  I kept a dry portion of the gauze over this and she will be instructed to do this once daily.  She is scheduled to see Dr. Cyndia Bent again next week and hopefully the wound will be mostly healed at that time.      Medication Changes: No orders of the defined types were placed in this encounter.     John Giovanni, PA-C 12/03/2019 2:30 PM

## 2019-12-03 NOTE — Patient Instructions (Signed)
Continue daily wet-to-dry saline dressing changes with 2 x 2

## 2019-12-04 ENCOUNTER — Other Ambulatory Visit: Payer: Self-pay

## 2019-12-04 ENCOUNTER — Ambulatory Visit: Payer: 59 | Admitting: Adult Health

## 2019-12-04 ENCOUNTER — Other Ambulatory Visit: Payer: 59

## 2019-12-04 ENCOUNTER — Ambulatory Visit: Payer: 59

## 2019-12-04 ENCOUNTER — Inpatient Hospital Stay (HOSPITAL_BASED_OUTPATIENT_CLINIC_OR_DEPARTMENT_OTHER): Payer: 59 | Admitting: Oncology

## 2019-12-04 VITALS — BP 123/79 | HR 70 | Temp 97.8°F | Resp 18 | Ht 61.0 in | Wt 205.7 lb

## 2019-12-04 DIAGNOSIS — C50411 Malignant neoplasm of upper-outer quadrant of right female breast: Secondary | ICD-10-CM

## 2019-12-04 DIAGNOSIS — J90 Pleural effusion, not elsewhere classified: Secondary | ICD-10-CM

## 2019-12-04 DIAGNOSIS — Z170 Estrogen receptor positive status [ER+]: Secondary | ICD-10-CM

## 2019-12-04 DIAGNOSIS — R000 Tachycardia, unspecified: Secondary | ICD-10-CM | POA: Diagnosis not present

## 2019-12-04 DIAGNOSIS — I871 Compression of vein: Secondary | ICD-10-CM

## 2019-12-04 DIAGNOSIS — T8130XA Disruption of wound, unspecified, initial encounter: Secondary | ICD-10-CM

## 2019-12-04 DIAGNOSIS — D696 Thrombocytopenia, unspecified: Secondary | ICD-10-CM

## 2019-12-04 MED ORDER — RIVAROXABAN 20 MG PO TABS: 20.0000 mg | ORAL_TABLET | Freq: Every day | ORAL | 3 refills | Status: DC

## 2019-12-05 ENCOUNTER — Telehealth: Payer: Self-pay | Admitting: Oncology

## 2019-12-05 NOTE — Telephone Encounter (Signed)
I talk with patient regarding schedule  

## 2019-12-06 ENCOUNTER — Ambulatory Visit: Payer: 59 | Admitting: Radiation Oncology

## 2019-12-10 ENCOUNTER — Telehealth: Payer: Self-pay

## 2019-12-10 ENCOUNTER — Ambulatory Visit: Payer: 59

## 2019-12-10 ENCOUNTER — Encounter: Payer: Self-pay | Admitting: *Deleted

## 2019-12-10 ENCOUNTER — Other Ambulatory Visit: Payer: Self-pay | Admitting: Oncology

## 2019-12-10 ENCOUNTER — Ambulatory Visit: Payer: 59 | Admitting: Radiation Oncology

## 2019-12-10 MED ORDER — CEPHALEXIN 500 MG PO CAPS: 500.0000 mg | ORAL_CAPSULE | Freq: Two times a day (BID) | ORAL | 0 refills | Status: DC

## 2019-12-10 NOTE — Telephone Encounter (Signed)
Pt called to report redness and "heat" to right breast.    Pt with history of right breast cancer, lumpectomy.  Pt to begin radiation to right breast on 3/8.    Pt reports heat and redness to right breast X 2 days.  Pt denies fever, injury, drainage, or any lumps to right breast.  Pt reports tenderness and redness around nipple.    Per MD recommendations pt to start Keflex 500mg  BID X 7 days.  Pt educated per MD - improvement should be noted by 3/3 - Pt verbalized agreement if no improvement to call clinic.

## 2019-12-11 ENCOUNTER — Ambulatory Visit: Payer: 59

## 2019-12-11 ENCOUNTER — Other Ambulatory Visit: Payer: 59

## 2019-12-11 ENCOUNTER — Ambulatory Visit: Payer: 59 | Admitting: Adult Health

## 2019-12-12 ENCOUNTER — Ambulatory Visit (INDEPENDENT_AMBULATORY_CARE_PROVIDER_SITE_OTHER): Payer: Self-pay | Admitting: Surgery

## 2019-12-12 ENCOUNTER — Encounter: Payer: Self-pay | Admitting: Surgery

## 2019-12-12 ENCOUNTER — Ambulatory Visit: Payer: 59

## 2019-12-12 ENCOUNTER — Other Ambulatory Visit: Payer: Self-pay

## 2019-12-12 VITALS — BP 133/93 | HR 71 | Temp 97.5°F | Resp 20 | Ht 61.0 in | Wt 206.0 lb

## 2019-12-12 DIAGNOSIS — Z9889 Other specified postprocedural states: Secondary | ICD-10-CM

## 2019-12-12 DIAGNOSIS — T8131XD Disruption of external operation (surgical) wound, not elsewhere classified, subsequent encounter: Secondary | ICD-10-CM

## 2019-12-12 NOTE — Progress Notes (Signed)
     HPI: The patient returns today for follow-up of her epigastric wound status post subxiphoid pericardial window.  She saw Jadene Pierini, PA-C on 12/03/2019 and had a blister formed at the top of the wound which was debrided.  She has been doing wet-to-dry dressing changes to the wound.  She denies any fever or chills.  She said she was started on antibiotics by Dr. Jana Hakim on Monday this week for right breast cellulitis which has improved.  Current Outpatient Medications  Medication Sig Dispense Refill  . cephALEXin (KEFLEX) 500 MG capsule Take 1 capsule (500 mg total) by mouth 2 (two) times daily. 14 capsule 0  . Cholecalciferol (VITAMIN D3) 25 MCG (1000 UT) CHEW Chew 1 tablet by mouth daily.     . rivaroxaban (XARELTO) 20 MG TABS tablet Take 1 tablet (20 mg total) by mouth daily with supper. 90 tablet 3  . metoprolol tartrate (LOPRESSOR) 25 MG tablet Take 1 tablet (25 mg total) by mouth 2 (two) times daily. 60 tablet 0   No current facility-administered medications for this visit.     Physical Exam: BP (!) 133/93 (BP Location: Right Arm, Patient Position: Sitting, Cuff Size: Normal)   Pulse 71   Temp (!) 97.5 F (36.4 C) (Temporal)   Resp 20   Ht 5\' 1"  (1.549 m)   Wt 206 lb (93.4 kg)   SpO2 100% Comment: RA  BMI 38.92 kg/m  The epigastric wound is almost completely healed.  There are 2 tiny areas of granulation tissue that are almost closed over.  There is no sign of cellulitis.  Diagnostic Tests: None today  Impression:  The wound is almost completely healed.  I told her that she could discontinue the wet-to-dry dressings and just keep a dry dressing over the wound until it is completely closed over.  She asked about doing her radiation simulation and starting radiation therapy next week.  I think that should be fine as far as this epigastric wound is concerned since that should be healed over by then.  Plan:  She will continue to follow-up with oncology and will return to  see [place] if she has any problems with her epigastric wound.   Gaye Pollack, MD [facility] [phone]

## 2019-12-13 ENCOUNTER — Ambulatory Visit: Payer: 59

## 2019-12-14 ENCOUNTER — Ambulatory Visit: Payer: 59

## 2019-12-17 ENCOUNTER — Ambulatory Visit
Admission: RE | Admit: 2019-12-17 | Discharge: 2019-12-17 | Disposition: A | Discharge status: Home / Self Care | Payer: 59 | Source: Ambulatory Visit | Attending: Radiation Oncology | Admitting: Radiation Oncology

## 2019-12-17 ENCOUNTER — Ambulatory Visit: Payer: 59

## 2019-12-17 ENCOUNTER — Other Ambulatory Visit: Payer: Self-pay

## 2019-12-17 ENCOUNTER — Ambulatory Visit: Payer: 59 | Admitting: Radiation Oncology

## 2019-12-17 DIAGNOSIS — C50411 Malignant neoplasm of upper-outer quadrant of right female breast: Secondary | ICD-10-CM | POA: Diagnosis present

## 2019-12-17 DIAGNOSIS — Z170 Estrogen receptor positive status [ER+]: Secondary | ICD-10-CM | POA: Diagnosis present

## 2019-12-18 ENCOUNTER — Ambulatory Visit: Payer: 59

## 2019-12-18 ENCOUNTER — Ambulatory Visit: Payer: 59 | Admitting: Adult Health

## 2019-12-18 ENCOUNTER — Other Ambulatory Visit: Payer: 59

## 2019-12-19 ENCOUNTER — Ambulatory Visit: Payer: 59

## 2019-12-20 ENCOUNTER — Ambulatory Visit: Payer: 59

## 2019-12-21 ENCOUNTER — Ambulatory Visit: Payer: 59

## 2019-12-24 ENCOUNTER — Ambulatory Visit: Payer: 59

## 2019-12-24 ENCOUNTER — Other Ambulatory Visit: Payer: Self-pay

## 2019-12-24 ENCOUNTER — Ambulatory Visit
Admission: RE | Admit: 2019-12-24 | Discharge: 2019-12-24 | Disposition: A | Discharge status: Home / Self Care | Payer: 59 | Source: Ambulatory Visit | Attending: Radiation Oncology | Admitting: Radiation Oncology

## 2019-12-24 DIAGNOSIS — C50411 Malignant neoplasm of upper-outer quadrant of right female breast: Secondary | ICD-10-CM | POA: Diagnosis not present

## 2019-12-25 ENCOUNTER — Other Ambulatory Visit: Payer: Self-pay

## 2019-12-25 ENCOUNTER — Other Ambulatory Visit: Payer: 59

## 2019-12-25 ENCOUNTER — Ambulatory Visit: Payer: 59 | Admitting: Oncology

## 2019-12-25 ENCOUNTER — Ambulatory Visit: Payer: 59

## 2019-12-25 ENCOUNTER — Ambulatory Visit
Admission: RE | Admit: 2019-12-25 | Discharge: 2019-12-25 | Disposition: A | Discharge status: Home / Self Care | Payer: 59 | Source: Ambulatory Visit | Attending: Radiation Oncology | Admitting: Radiation Oncology

## 2019-12-25 DIAGNOSIS — C50411 Malignant neoplasm of upper-outer quadrant of right female breast: Secondary | ICD-10-CM | POA: Diagnosis not present

## 2019-12-26 ENCOUNTER — Ambulatory Visit: Payer: 59

## 2019-12-26 ENCOUNTER — Other Ambulatory Visit: Payer: Self-pay

## 2019-12-26 ENCOUNTER — Ambulatory Visit
Admission: RE | Admit: 2019-12-26 | Discharge: 2019-12-26 | Disposition: A | Discharge status: Home / Self Care | Payer: 59 | Source: Ambulatory Visit | Attending: Radiation Oncology | Admitting: Radiation Oncology

## 2019-12-26 DIAGNOSIS — C50411 Malignant neoplasm of upper-outer quadrant of right female breast: Secondary | ICD-10-CM | POA: Diagnosis not present

## 2019-12-27 ENCOUNTER — Ambulatory Visit: Payer: 59

## 2019-12-27 ENCOUNTER — Other Ambulatory Visit: Payer: Self-pay

## 2019-12-27 ENCOUNTER — Ambulatory Visit
Admission: RE | Admit: 2019-12-27 | Discharge: 2019-12-27 | Disposition: A | Discharge status: Home / Self Care | Payer: 59 | Source: Ambulatory Visit | Attending: Radiation Oncology | Admitting: Radiation Oncology

## 2019-12-27 DIAGNOSIS — C50411 Malignant neoplasm of upper-outer quadrant of right female breast: Secondary | ICD-10-CM | POA: Diagnosis not present

## 2019-12-28 ENCOUNTER — Ambulatory Visit
Admission: RE | Admit: 2019-12-28 | Discharge: 2019-12-28 | Disposition: A | Discharge status: Home / Self Care | Payer: 59 | Source: Ambulatory Visit | Attending: Radiation Oncology | Admitting: Radiation Oncology

## 2019-12-28 ENCOUNTER — Ambulatory Visit: Payer: 59

## 2019-12-28 ENCOUNTER — Other Ambulatory Visit: Payer: Self-pay

## 2019-12-28 DIAGNOSIS — Z170 Estrogen receptor positive status [ER+]: Secondary | ICD-10-CM

## 2019-12-28 DIAGNOSIS — C50411 Malignant neoplasm of upper-outer quadrant of right female breast: Secondary | ICD-10-CM

## 2019-12-28 MED ORDER — SONAFINE EX EMUL
1.0000 "application " | Freq: Once | CUTANEOUS | Status: AC
  Administered 2019-12-28: 1 via TOPICAL

## 2019-12-28 MED ORDER — ALRA NON-METALLIC DEODORANT (RAD-ONC)
1.0000 "application " | Freq: Once | TOPICAL | Status: AC
  Administered 2019-12-28: 1 via TOPICAL

## 2019-12-28 NOTE — Progress Notes (Signed)
Pt here for patient teaching.  Pt given Radiation and You booklet, skin care instructions, Alra deodorant and Sonafine.  Reviewed areas of pertinence such as fatigue, hair loss, skin changes, breast tenderness and breast swelling . Pt able to give teach back of to pat skin and use unscented/gentle soap,apply Sonafine bid, avoid applying anything to skin within 4 hours of treatment, avoid wearing an under wire bra and to use an electric razor if they must shave. Pt verbalizes understanding of information given and will contact nursing with any questions or concerns.     Lakeva Hollon M. Karis Rilling RN, BSN             

## 2019-12-31 ENCOUNTER — Ambulatory Visit: Payer: 59

## 2019-12-31 ENCOUNTER — Other Ambulatory Visit: Payer: Self-pay

## 2019-12-31 ENCOUNTER — Ambulatory Visit
Admission: RE | Admit: 2019-12-31 | Discharge: 2019-12-31 | Disposition: A | Discharge status: Home / Self Care | Payer: 59 | Source: Ambulatory Visit | Attending: Radiation Oncology | Admitting: Radiation Oncology

## 2019-12-31 DIAGNOSIS — C50411 Malignant neoplasm of upper-outer quadrant of right female breast: Secondary | ICD-10-CM | POA: Diagnosis not present

## 2020-01-01 ENCOUNTER — Ambulatory Visit: Payer: 59

## 2020-01-01 ENCOUNTER — Other Ambulatory Visit: Payer: Self-pay

## 2020-01-01 ENCOUNTER — Ambulatory Visit
Admission: RE | Admit: 2020-01-01 | Discharge: 2020-01-01 | Disposition: A | Discharge status: Home / Self Care | Payer: 59 | Source: Ambulatory Visit | Attending: Radiation Oncology | Admitting: Radiation Oncology

## 2020-01-01 DIAGNOSIS — C50411 Malignant neoplasm of upper-outer quadrant of right female breast: Secondary | ICD-10-CM | POA: Diagnosis not present

## 2020-01-02 ENCOUNTER — Ambulatory Visit: Payer: 59

## 2020-01-02 ENCOUNTER — Other Ambulatory Visit: Payer: Self-pay

## 2020-01-02 ENCOUNTER — Ambulatory Visit
Admission: RE | Admit: 2020-01-02 | Discharge: 2020-01-02 | Disposition: A | Discharge status: Home / Self Care | Payer: 59 | Source: Ambulatory Visit | Attending: Radiation Oncology | Admitting: Radiation Oncology

## 2020-01-02 DIAGNOSIS — C50411 Malignant neoplasm of upper-outer quadrant of right female breast: Secondary | ICD-10-CM | POA: Diagnosis not present

## 2020-01-03 ENCOUNTER — Ambulatory Visit
Admission: RE | Admit: 2020-01-03 | Discharge: 2020-01-03 | Disposition: A | Discharge status: Home / Self Care | Payer: 59 | Source: Ambulatory Visit | Attending: Radiation Oncology | Admitting: Radiation Oncology

## 2020-01-03 ENCOUNTER — Other Ambulatory Visit: Payer: Self-pay

## 2020-01-03 ENCOUNTER — Ambulatory Visit: Payer: 59

## 2020-01-03 DIAGNOSIS — C50411 Malignant neoplasm of upper-outer quadrant of right female breast: Secondary | ICD-10-CM | POA: Diagnosis not present

## 2020-01-04 ENCOUNTER — Other Ambulatory Visit: Payer: Self-pay

## 2020-01-04 ENCOUNTER — Ambulatory Visit: Payer: 59

## 2020-01-04 ENCOUNTER — Ambulatory Visit
Admission: RE | Admit: 2020-01-04 | Discharge: 2020-01-04 | Disposition: A | Discharge status: Home / Self Care | Payer: 59 | Source: Ambulatory Visit | Attending: Radiation Oncology | Admitting: Radiation Oncology

## 2020-01-04 DIAGNOSIS — C50411 Malignant neoplasm of upper-outer quadrant of right female breast: Secondary | ICD-10-CM | POA: Diagnosis not present

## 2020-01-07 ENCOUNTER — Ambulatory Visit
Admission: RE | Admit: 2020-01-07 | Discharge: 2020-01-07 | Disposition: A | Discharge status: Home / Self Care | Payer: 59 | Source: Ambulatory Visit | Attending: Radiation Oncology | Admitting: Radiation Oncology

## 2020-01-07 ENCOUNTER — Ambulatory Visit: Payer: 59

## 2020-01-07 ENCOUNTER — Other Ambulatory Visit: Payer: Self-pay

## 2020-01-07 DIAGNOSIS — C50411 Malignant neoplasm of upper-outer quadrant of right female breast: Secondary | ICD-10-CM | POA: Diagnosis not present

## 2020-01-08 ENCOUNTER — Other Ambulatory Visit: Payer: Self-pay

## 2020-01-08 ENCOUNTER — Ambulatory Visit
Admission: RE | Admit: 2020-01-08 | Discharge: 2020-01-08 | Disposition: A | Discharge status: Home / Self Care | Payer: 59 | Source: Ambulatory Visit | Attending: Radiation Oncology | Admitting: Radiation Oncology

## 2020-01-08 ENCOUNTER — Ambulatory Visit: Payer: 59

## 2020-01-08 DIAGNOSIS — C50411 Malignant neoplasm of upper-outer quadrant of right female breast: Secondary | ICD-10-CM | POA: Diagnosis not present

## 2020-01-09 ENCOUNTER — Ambulatory Visit: Payer: 59

## 2020-01-09 ENCOUNTER — Other Ambulatory Visit: Payer: Self-pay

## 2020-01-09 ENCOUNTER — Ambulatory Visit
Admission: RE | Admit: 2020-01-09 | Discharge: 2020-01-09 | Disposition: A | Discharge status: Home / Self Care | Payer: 59 | Source: Ambulatory Visit | Attending: Radiation Oncology | Admitting: Radiation Oncology

## 2020-01-09 DIAGNOSIS — C50411 Malignant neoplasm of upper-outer quadrant of right female breast: Secondary | ICD-10-CM | POA: Diagnosis not present

## 2020-01-10 ENCOUNTER — Ambulatory Visit
Admission: RE | Admit: 2020-01-10 | Discharge: 2020-01-10 | Disposition: A | Discharge status: Home / Self Care | Payer: 59 | Source: Ambulatory Visit | Attending: Radiation Oncology | Admitting: Radiation Oncology

## 2020-01-10 ENCOUNTER — Ambulatory Visit: Payer: 59

## 2020-01-10 ENCOUNTER — Other Ambulatory Visit: Payer: Self-pay

## 2020-01-10 DIAGNOSIS — C50411 Malignant neoplasm of upper-outer quadrant of right female breast: Secondary | ICD-10-CM | POA: Insufficient documentation

## 2020-01-10 DIAGNOSIS — Z170 Estrogen receptor positive status [ER+]: Secondary | ICD-10-CM | POA: Insufficient documentation

## 2020-01-11 ENCOUNTER — Other Ambulatory Visit: Payer: Self-pay

## 2020-01-11 ENCOUNTER — Ambulatory Visit
Admission: RE | Admit: 2020-01-11 | Discharge: 2020-01-11 | Disposition: A | Discharge status: Home / Self Care | Payer: 59 | Source: Ambulatory Visit | Attending: Radiation Oncology | Admitting: Radiation Oncology

## 2020-01-11 ENCOUNTER — Ambulatory Visit: Payer: 59

## 2020-01-11 ENCOUNTER — Other Ambulatory Visit: Payer: Self-pay | Admitting: *Deleted

## 2020-01-11 DIAGNOSIS — Z170 Estrogen receptor positive status [ER+]: Secondary | ICD-10-CM

## 2020-01-11 DIAGNOSIS — C50411 Malignant neoplasm of upper-outer quadrant of right female breast: Secondary | ICD-10-CM | POA: Diagnosis not present

## 2020-01-13 NOTE — Progress Notes (Signed)
[place]  Telephone:([phone] Fax:(336) [phone]     ID: Veronica [name] DOB: 12-22-1970  MR#: [id number]  EXH#:[id number]  Patient Care Team: Veronica [name], Veronica [name] as PCP - General (Physician Assistant) Veronica Kaufmann, RN as Oncology Nurse Navigator Veronica Germany, RN as Oncology Nurse Navigator Veronica Luna, MD as Consulting Physician (General Surgery) Veronica [name], Veronica Dad, MD as Consulting Physician (Oncology) Veronica Rudd, MD as Consulting Physician (Radiation Oncology) Veronica Pollack, MD as Consulting Physician (Cardiothoracic Surgery) Veronica Cruel, MD OTHER MD:  CHIEF COMPLAINT: Estrogen receptor positive breast cancer  CURRENT TREATMENT:  adjuvant radiation therapy   INTERVAL HISTORY: Veronica [name] returns today for follow-up of her estrogen receptor positive breast cancer.   She was referred back to Dr. Lisbeth [name] on 10/31/2019 for radiation therapy. She began treatment on 12/24/2019 and is scheduled to finish on 02/06/2020.  She is tolerating treatment well so far although she has developed a rash under the right breast.  She was given a powder she says to treat this but it has not helped   REVIEW OF SYSTEMS: Veronica [name] continues to recover from her post chemo problems.  She is walking for exercise including cold and rainy days.  She did a lot of cooking for the holidays.  She has had no unusual headaches visual changes nausea vomiting cough phlegm production pleurisy or change in bowel or bladder habits.  Her midline chest incision has healed well by secondary intention.  A detailed review of systems was otherwise noncontributory   HISTORY OF CURRENT ILLNESS: From the original intake note:  Veronica [name] had routine screening mammography on 04/27/2019 showing a possible abnormality in the right breast. She underwent right diagnostic mammography with tomography and right breast ultrasonography at [facility] on 05/01/2019 showing: breast density category C; highly  suspicious 1.7 cm irregular mass in the right breast at 10 o'clock, 8 cm from the nipple; indeterminate hypoechoic round mass in the right axillary tail/low axilla. Physical exam showed no suspicious lumps.  Accordingly on 05/02/2019 she proceeded to biopsy of the right breast area in question. The pathology from this procedure ([id number]) showed: invasive ductal carcinoma, grade 3; ductal carcinoma in situ; lymphovascular invasion present. Prognostic indicators significant for: estrogen receptor, 30% positive with moderate staining intensity, and progesterone receptor, 30% positive with strong staining intensity. Proliferation marker Ki67 at 20%. HER2 negative by immunohistochemistry (1+).  The second "satellite" lesion was a lymph node which was also positive.  The patient's subsequent history is as detailed below.   PAST MEDICAL HISTORY: Past Medical History:  Diagnosis Date  . Cancer ([place])   . Chronic low back pain with left-sided sciatica   . Family history of leukemia   . Family history of stomach cancer   . Hypertension     PAST SURGICAL HISTORY: Past Surgical History:  Procedure Laterality Date  . APPLICATION OF WOUND VAC N/A 10/16/2019   Procedure: APPLICATION OF WOUND VAC;  Surgeon: Veronica Pollack, MD;  Location: [facility];  Service: Vascular;  Laterality: N/A;  . BREAST BIOPSY Right 05/02/2019   right clips X2  . BREAST LUMPECTOMY WITH RADIOACTIVE SEED AND SENTINEL LYMPH NODE BIOPSY Right 06/12/2019   Procedure: RIGHT BREAST RADIOACTIVE SEED LUMPECTOMY  AND RIGHT AXILLARY SEED TARGETED LYMPH NODE AND AXILLARY SENTINEL LYMPH NODE MAPPING;  Surgeon: Veronica Luna, MD;  Location: [place];  Service: General;  Laterality: Right;  PEC BLOCK  . C/S x2  '98 '03  . CENTRAL VENOUS  CATHETER INSERTION  10/02/2019   Procedure: Insertion Central Line Adult;  Surgeon: Veronica Mitchell, MD;  Location: [place];  Service: Vascular;;  . CESAREAN SECTION    . CHOLECYSTECTOMY    . I  & D EXTREMITY N/A 10/16/2019   Procedure: DEBRIDEMENT of DEHISCED MIDLINE SURGICAL INCISION;  Surgeon: Veronica Pollack, MD;  Location: [facility];  Service: Vascular;  Laterality: N/A;  . LAPAROSCOPIC ASSISTED VAGINAL HYSTERECTOMY  05/17/2011   Procedure: LAPAROSCOPIC ASSISTED VAGINAL HYSTERECTOMY;  Surgeon: Veronica [name];  Location: [facility];  Service: Gynecology;  Laterality: N/A;  Laparoscopic Assisted Vaginal Hysterectomy With Lysis Of Adhesions  . PERICARDIAL WINDOW N/A 10/02/2019   Procedure: Pericardial Window;  Surgeon: Veronica Mitchell, MD;  Location: [place];  Service: Vascular;  Laterality: N/A;  . PORT-A-CATH REMOVAL  10/02/2019   Procedure: Removal Port-A-Cath;  Surgeon: Veronica Mitchell, MD;  Location: [place];  Service: Vascular;;  . PORTACATH PLACEMENT Right 06/12/2019   Procedure: INSERTION PORT-A-CATH WITH ULTRASOUND;  Surgeon: Veronica Luna, MD;  Location: [place];  Service: General;  Laterality: Right;  . RE-EXCISION OF BREAST LUMPECTOMY Right 07/17/2019   Procedure: RE-EXCISION OF RIGHT BREAST LUMPECTOMY;  Surgeon: Veronica Luna, MD;  Location: [place];  Service: General;  Laterality: Right;  . TUBAL LIGATION    . ULTRASOUND GUIDANCE FOR VASCULAR ACCESS  10/02/2019   Procedure: Ultrasound Guidance For Vascular Access;  Surgeon: Veronica Mitchell, MD;  Location: [facility];  Service: Vascular;;  . VENOGRAM N/A 10/02/2019   Procedure: Central VENOGRAM;  Surgeon: Veronica Mitchell, MD;  Location: [facility];  Service: Vascular;  Laterality: N/A;    FAMILY HISTORY: Family History  Problem Relation Age of Onset  . Arthritis Mother   . COPD Mother   . Hypertension Mother   . Hyperlipidemia Mother   . Leukemia Brother 2  . Stomach cancer Maternal Grandmother        late 43s   Patient's parents are living (as of September 2020). Her father is 91 and her mother is 26 as of 04/2019. The patient denies a family hx of breast or ovarian cancer. She has 7 siblings, 6  brothers and 1 sister. She reports her maternal grandmother was diagnosed with stomach cancer at age 73. Her brother was diagnosed with leukemia at age 41.   GYNECOLOGIC HISTORY:  No LMP recorded. Patient has had a hysterectomy. Menarche: 49 years old Age at first live birth: 49 years old GX P 2 (+1 stillborn) LMP age 74-42 Contraceptive: unsure, maybe for a year at age 31. HRT no Hysterectomy? Yes, 05/2011 BSO? 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 She is married. Husband Sheppard Plumber") is a Hydrologist for a telephone company. She lives at home with her husband and two daughters. Daughter Suszanne Conners, age 26, is a Scientist, water quality. Daughter Jarrett Soho, age 16, is a Ship broker.     ADVANCED DIRECTIVES: Husband Heath Lark is her HCPOA.   HEALTH MAINTENANCE: Social History   Tobacco Use  . Smoking status: Never Smoker  . Smokeless tobacco: Never Used  Substance Use Topics  . Alcohol use: Yes    Alcohol/week: 2.0 standard drinks    Types: 2 Standard drinks or equivalent per week    Comment: "2-3 a week"  . Drug use: No     Colonoscopy: n/a  PAP: 02/2018  Bone density: n/a   No Known Allergies  Current Outpatient Medications  Medication Sig Dispense  Refill  . anastrozole (ARIMIDEX) 1 MG tablet Take 1 tablet (1 mg total) by mouth daily. 90 tablet 4  . Cholecalciferol (VITAMIN D3) 25 MCG (1000 UT) CHEW Chew 1 tablet by mouth daily.     . fluconazole (DIFLUCAN) 100 MG tablet Take 1 tablet (100 mg total) by mouth daily. 20 tablet 0  . ketoconazole (NIZORAL) 2 % cream Apply 1 application topically daily. 15 g 0  . metoprolol tartrate (LOPRESSOR) 25 MG tablet Take 1 tablet (25 mg total) by mouth 2 (two) times daily. 60 tablet 0  . rivaroxaban (XARELTO) 20 MG TABS tablet Take 1 tablet (20 mg total) by mouth daily with supper. 90 tablet 3   No current facility-administered medications for this visit.    OBJECTIVE: white woman in no acute  distress  Vitals:   01/14/20 1151  BP: 126/72  Pulse: (!) 57  Resp: 18  Temp: 98.5 F (36.9 C)  SpO2: 100%   Wt Readings from Last 3 Encounters:  01/14/20 203 lb 1.6 oz (92.1 kg)  12/12/19 206 lb (93.4 kg)  12/04/19 205 lb 11.2 oz (93.3 kg)   Body mass index is 38.38 kg/m.    ECOG FS:1 - Symptomatic but completely ambulatory  Sclerae unicteric, EOMs intact Wearing a mask No cervical or supraclavicular adenopathy Lungs no rales or rhonchi Heart regular rate and rhythm Abd soft, nontender, positive bowel sounds MSK no focal spinal tenderness, no upper extremity lymphedema Neuro: nonfocal, well oriented, appropriate affect Breasts: The right breast is status post lumpectomy and is currently receiving radiation.  There is some swelling and minimal skin hyperpigmentation.  There is a candidal appearing rash in the inframammary fold the left breast is unremarkable.  Both axillae are benign.   LAB RESULTS:  CMP     Component Value Date/Time   NA 139 11/15/2019 1129   K 4.4 11/15/2019 1129   CL 105 11/15/2019 1129   CO2 25 11/15/2019 1129   GLUCOSE 137 (H) 11/15/2019 1129   BUN 10 11/15/2019 1129   CREATININE 0.74 11/15/2019 1129   CALCIUM 9.8 11/15/2019 1129   PROT 7.1 11/15/2019 1129   ALBUMIN 4.0 11/15/2019 1129   AST 28 11/15/2019 1129   ALT 30 11/15/2019 1129   ALKPHOS 69 11/15/2019 1129   BILITOT 1.4 (H) 11/15/2019 1129   GFRNONAA >60 11/15/2019 1129   GFRAA >60 11/15/2019 1129    No results found for: TOTALPROTELP, ALBUMINELP, A1GS, A2GS, BETS, BETA2SER, GAMS, MSPIKE, SPEI  No results found for: Nils Pyle, [facility]  Lab Results  Component Value Date   WBC 4.6 01/14/2020   NEUTROABS 3.4 01/14/2020   HGB 14.1 01/14/2020   HCT 41.5 01/14/2020   MCV 84.7 01/14/2020   PLT 177 01/14/2020   No results found for: LABCA2  No components found for: ZESPQZ300  Recent Labs  Lab 01/14/20 1123  INR 1.4*    No results found for:  LABCA2  No results found for: TMA263  No results found for: FHL456  No results found for: YBW389  No results found for: CA2729  No components found for: HGQUANT  No results found for: CEA1 / No results found for: CEA1   No results found for: AFPTUMOR  No results found for: CHROMOGRNA  No results found for: HGBA, HGBA2QUANT, HGBFQUANT, HGBSQUAN (Hemoglobinopathy evaluation)   Lab Results  Component Value Date   LDH 170 09/30/2019    No results found for: IRON, TIBC, IRONPCTSAT (Iron and TIBC)  Lab Results  Component Value  Date   FERRITIN 498 (H) 10/19/2019    Urinalysis    Component Value Date/Time   COLORURINE YELLOW 08/28/2019 2326   APPEARANCEUR CLOUDY (A) 08/28/2019 2326   LABSPEC 1.013 08/28/2019 2326   PHURINE 5.0 08/28/2019 2326   GLUCOSEU NEGATIVE 08/28/2019 2326   HGBUR MODERATE (A) 08/28/2019 2326   BILIRUBINUR NEGATIVE 08/28/2019 2326   KETONESUR NEGATIVE 08/28/2019 2326   PROTEINUR NEGATIVE 08/28/2019 2326   NITRITE NEGATIVE 08/28/2019 2326   LEUKOCYTESUR MODERATE (A) 08/28/2019 2326    STUDIES: No results found.   ELIGIBLE FOR AVAILABLE RESEARCH PROTOCOL: AET  ASSESSMENT: 49 y.o. [place], [place] woman status post right breast upper outer quadrant biopsy 05/01/2019 for a clinical T1c N1, stage IIA invasive ductal carcinoma, grade 3, estrogen and progesterone receptor positive, HER-2 nonamplified, with an MIB-1-1 of 20%.  (a) mass in the axillary tail was a positive lymph node  (1) MammaPrint obtained from the original biopsy shows a high risk luminal B tumor and predicts a 5-year disease-free survival of 91% in her case  (2) genetics testing 05/09/2019 through the Common Hereditary Gene Panel offered by [facility] found no deleterious mutations in APC, ATM, AXIN2, BARD1, BMPR1A, BRCA1, BRCA2, BRIP1, CDH1, CDK4, CDKN2A (p14ARF), CDKN2A (p16INK4a), CHEK2, CTNNA1, DICER1, EPCAM (Deletion/duplication testing only), GREM1 (promoter region  deletion/duplication testing only), KIT, MEN1, MLH1, MSH2, MSH3, MSH6, MUTYH, NBN, NF1, NHTL1, PALB2, PDGFRA, PMS2, POLD1, POLE, PTEN, RAD50, RAD51C, RAD51D, RNF43, SDHB, SDHC, SDHD, SMAD4, SMARCA4. STK11, TP53, TSC1, TSC2, and VHL.  The following genes were evaluated for sequence changes only: SDHA and HOXB13 c.251G>A variant only.   (a) A variant of uncertain significance (VUS) was detected in one of her MSH6 genes (c.831A>C).  (3) status post right lumpectomy and sentinel lymph node sampling 06/12/2019 for a pT2 pN1, stage IIA invasive ductal carcinoma, grade 2, with positive margins  (a) a total of 4 sentinel lymph nodes removed, one positive (with ECE), ine itc  (b) margin clearance 04/19/2019 successful (medial margin close but negative for DCIS)  (4) adjuvant chemotherapy consisting of doxorubicin and cyclophosphamide in dose dense fashion x4 starting 07/10/2019, completed 09/24/2019; planned weekly paclitaxel x12 omitted   (a) echo 06/26/2019 shows an EF of 60-65%  (b echo on 09/19/2019 shows an EF of 60-65%  (c) chemotherapy stopped after four cycles of doxorubicin and cyclophosphamide due to #5 below  (5) Multiple VTE/PE documented 10/02/2019, on intravenous heparin initially, then Xarelto started 10/19/2019  (a) presented with SVC syndrome and bilateral pulmonary emboli on 09/28/2019  (b) s/p SVC thrombectomy and port removal 99/35/7017 complicated by hemopericardium and tamponade, necessitating pericardial window placement   (c) postop course complicated by wound dehiscence requiring wound VAC  (c) Doppler 10/03/2019 showed right lower extremity DVT  (d) CT angio and Doppler of both upper extremities 10/13/2019 found persistent acute bilateral pulmonary emboli, persistent but improved SVC thrombus, bilateral pleural effusions, and right lower lobe collapse. Bilateral upper extremity DVTs also noted  (e) Dopplers upper extremity 11/19/2019 showed clots cleared on the left, chronic DVT  right internal jugular and axillary veins  (6) COVID-19 infection documented 09/27/2020, status post remdesivir  (7) adjuvant radiation 12/24/2019 - 02/06/2020  (8) anastrozole to start mid May 2021  (a) [facility] and estradiol obtained 11/15/2019 consistent with menopause  (b) not a good candidate for tamoxifen given history of DVT/PE above   PLAN:  Veronica [name] is beginning to approach her new normal.  Her hair is coming in thickened curly and a little salt-and-pepper.  She has  good energy.  She is tolerating radiation well.  I wrote for Nizoral cream for her inframammary yeast infection and if that does not work she will use Diflucan.  She will let me know if that does not clear.  I think she will be ready to start anastrozole in mid May and I went ahead and wrote the prescription for her.  We did discuss the possible toxicities side effects and complications of this agent.  She will see me then mid July.  She is tolerating anastrozole well the plan will be to continue that a minimum of 5 years.  She will have repeat mammography sometime in the fall  She knows to call for any other issue that may develop before the next visit.  Total encounter time 25 minutes.Veronica Jews C. Cataldo Cosgriff, MD Medical Oncology and Hematology [facility] [place], [place] [address] Tel. [phone]    Fax. [phone]   I, Wilburn Mylar, am acting as scribe for Dr. Virgie [name]. Veronica [name].  I, Lurline Del MD, have reviewed the above documentation for accuracy and completeness, and I agree with the above.    *Total Encounter Time as defined by the Centers for Medicare and Medicaid Services includes, in addition to the face-to-face time of a patient visit (documented in the note above) non-face-to-face time: obtaining and reviewing outside history, ordering and reviewing medications, tests or procedures, care coordination (communications with other health care professionals or  caregivers) and documentation in the medical record.

## 2020-01-14 ENCOUNTER — Ambulatory Visit
Admission: RE | Admit: 2020-01-14 | Discharge: 2020-01-14 | Disposition: A | Discharge status: Home / Self Care | Payer: 59 | Source: Ambulatory Visit | Attending: Radiation Oncology | Admitting: Radiation Oncology

## 2020-01-14 ENCOUNTER — Ambulatory Visit: Payer: 59

## 2020-01-14 ENCOUNTER — Inpatient Hospital Stay: Payer: 59

## 2020-01-14 ENCOUNTER — Inpatient Hospital Stay: Payer: 59 | Attending: Oncology | Admitting: Oncology

## 2020-01-14 ENCOUNTER — Other Ambulatory Visit: Payer: Self-pay

## 2020-01-14 VITALS — BP 126/72 | HR 57 | Temp 98.5°F | Resp 18 | Ht 61.0 in | Wt 203.1 lb

## 2020-01-14 DIAGNOSIS — D696 Thrombocytopenia, unspecified: Secondary | ICD-10-CM | POA: Diagnosis not present

## 2020-01-14 DIAGNOSIS — Z170 Estrogen receptor positive status [ER+]: Secondary | ICD-10-CM

## 2020-01-14 DIAGNOSIS — Z8616 Personal history of COVID-19: Secondary | ICD-10-CM | POA: Diagnosis not present

## 2020-01-14 DIAGNOSIS — Z86711 Personal history of pulmonary embolism: Secondary | ICD-10-CM | POA: Insufficient documentation

## 2020-01-14 DIAGNOSIS — Z86718 Personal history of other venous thrombosis and embolism: Secondary | ICD-10-CM | POA: Diagnosis not present

## 2020-01-14 DIAGNOSIS — I871 Compression of vein: Secondary | ICD-10-CM

## 2020-01-14 DIAGNOSIS — B379 Candidiasis, unspecified: Secondary | ICD-10-CM | POA: Diagnosis not present

## 2020-01-14 DIAGNOSIS — C50411 Malignant neoplasm of upper-outer quadrant of right female breast: Secondary | ICD-10-CM | POA: Diagnosis present

## 2020-01-14 DIAGNOSIS — C773 Secondary and unspecified malignant neoplasm of axilla and upper limb lymph nodes: Secondary | ICD-10-CM | POA: Diagnosis not present

## 2020-01-14 DIAGNOSIS — T8130XA Disruption of wound, unspecified, initial encounter: Secondary | ICD-10-CM

## 2020-01-14 LAB — CBC WITH DIFFERENTIAL (CANCER CENTER ONLY)
Abs Immature Granulocytes: 0.02 10*3/uL (ref 0.00–0.07)
Basophils Absolute: 0 10*3/uL (ref 0.0–0.1)
Basophils Relative: 0 %
Eosinophils Absolute: 0.1 10*3/uL (ref 0.0–0.5)
Eosinophils Relative: 2 %
HCT: 41.5 % (ref 36.0–46.0)
Hemoglobin: 14.1 g/dL (ref 12.0–15.0)
Immature Granulocytes: 0 %
Lymphocytes Relative: 13 %
Lymphs Abs: 0.6 10*3/uL — ABNORMAL LOW (ref 0.7–4.0)
MCH: 28.8 pg (ref 26.0–34.0)
MCHC: 34 g/dL (ref 30.0–36.0)
MCV: 84.7 fL (ref 80.0–100.0)
Monocytes Absolute: 0.4 10*3/uL (ref 0.1–1.0)
Monocytes Relative: 9 %
Neutro Abs: 3.4 10*3/uL (ref 1.7–7.7)
Neutrophils Relative %: 76 %
Platelet Count: 177 10*3/uL (ref 150–400)
RBC: 4.9 MIL/uL (ref 3.87–5.11)
RDW: 13.8 % (ref 11.5–15.5)
WBC Count: 4.6 10*3/uL (ref 4.0–10.5)
nRBC: 0 % (ref 0.0–0.2)

## 2020-01-14 LAB — PROTIME-INR
INR: 1.4 — ABNORMAL HIGH (ref 0.8–1.2)
Prothrombin Time: 16.9 seconds — ABNORMAL HIGH (ref 11.4–15.2)

## 2020-01-14 LAB — CMP (CANCER CENTER ONLY)
ALT: 24 U/L (ref 0–44)
AST: 24 U/L (ref 15–41)
Albumin: 3.9 g/dL (ref 3.5–5.0)
Alkaline Phosphatase: 63 U/L (ref 38–126)
Anion gap: 11 (ref 5–15)
BUN: 14 mg/dL (ref 6–20)
CO2: 25 mmol/L (ref 22–32)
Calcium: 9.9 mg/dL (ref 8.9–10.3)
Chloride: 108 mmol/L (ref 98–111)
Creatinine: 0.76 mg/dL (ref 0.44–1.00)
GFR, Est AFR Am: >60 mL/min (ref >60)
GFR, Estimated: >60 mL/min (ref >60)
Glucose, Bld: 113 mg/dL — ABNORMAL HIGH (ref 70–99)
Potassium: 4.6 mmol/L (ref 3.5–5.1)
Sodium: 144 mmol/L (ref 135–145)
Total Bilirubin: 1.1 mg/dL (ref 0.3–1.2)
Total Protein: 6.7 g/dL (ref 6.5–8.1)

## 2020-01-14 MED ORDER — FLUCONAZOLE 100 MG PO TABS: 100.0000 mg | ORAL_TABLET | Freq: Every day | ORAL | 0 refills | Status: DC

## 2020-01-14 MED ORDER — ANASTROZOLE 1 MG PO TABS: 1.0000 mg | ORAL_TABLET | Freq: Every day | ORAL | 4 refills | Status: DC

## 2020-01-14 MED ORDER — KETOCONAZOLE 2 % EX CREA: 1.0000 "application " | TOPICAL_CREAM | Freq: Every day | CUTANEOUS | 0 refills | Status: DC

## 2020-01-15 ENCOUNTER — Other Ambulatory Visit: Payer: Self-pay

## 2020-01-15 ENCOUNTER — Ambulatory Visit
Admission: RE | Admit: 2020-01-15 | Discharge: 2020-01-15 | Disposition: A | Discharge status: Home / Self Care | Payer: 59 | Source: Ambulatory Visit | Attending: Radiation Oncology | Admitting: Radiation Oncology

## 2020-01-15 ENCOUNTER — Ambulatory Visit: Payer: 59

## 2020-01-15 ENCOUNTER — Telehealth: Payer: Self-pay | Admitting: Oncology

## 2020-01-15 DIAGNOSIS — C50411 Malignant neoplasm of upper-outer quadrant of right female breast: Secondary | ICD-10-CM | POA: Diagnosis not present

## 2020-01-15 NOTE — Telephone Encounter (Signed)
Scheduled appts per 4/5 los. Pt confirmed appt date and times.

## 2020-01-16 ENCOUNTER — Other Ambulatory Visit: Payer: Self-pay

## 2020-01-16 ENCOUNTER — Ambulatory Visit: Payer: 59

## 2020-01-16 ENCOUNTER — Ambulatory Visit
Admission: RE | Admit: 2020-01-16 | Discharge: 2020-01-16 | Disposition: A | Discharge status: Home / Self Care | Payer: 59 | Source: Ambulatory Visit | Attending: Radiation Oncology | Admitting: Radiation Oncology

## 2020-01-16 DIAGNOSIS — C50411 Malignant neoplasm of upper-outer quadrant of right female breast: Secondary | ICD-10-CM | POA: Diagnosis not present

## 2020-01-17 ENCOUNTER — Other Ambulatory Visit: Payer: Self-pay

## 2020-01-17 ENCOUNTER — Ambulatory Visit: Payer: 59

## 2020-01-17 ENCOUNTER — Ambulatory Visit
Admission: RE | Admit: 2020-01-17 | Discharge: 2020-01-17 | Disposition: A | Discharge status: Home / Self Care | Payer: 59 | Source: Ambulatory Visit | Attending: Radiation Oncology | Admitting: Radiation Oncology

## 2020-01-17 DIAGNOSIS — C50411 Malignant neoplasm of upper-outer quadrant of right female breast: Secondary | ICD-10-CM | POA: Diagnosis not present

## 2020-01-18 ENCOUNTER — Ambulatory Visit: Payer: 59 | Admitting: Radiation Oncology

## 2020-01-18 ENCOUNTER — Ambulatory Visit: Payer: 59

## 2020-01-18 ENCOUNTER — Other Ambulatory Visit: Payer: Self-pay

## 2020-01-18 ENCOUNTER — Ambulatory Visit
Admission: RE | Admit: 2020-01-18 | Discharge: 2020-01-18 | Disposition: A | Discharge status: Home / Self Care | Payer: 59 | Source: Ambulatory Visit | Attending: Radiation Oncology | Admitting: Radiation Oncology

## 2020-01-18 DIAGNOSIS — C50411 Malignant neoplasm of upper-outer quadrant of right female breast: Secondary | ICD-10-CM | POA: Diagnosis not present

## 2020-01-21 ENCOUNTER — Other Ambulatory Visit: Payer: Self-pay

## 2020-01-21 ENCOUNTER — Ambulatory Visit: Payer: 59

## 2020-01-21 ENCOUNTER — Ambulatory Visit
Admission: RE | Admit: 2020-01-21 | Discharge: 2020-01-21 | Disposition: A | Discharge status: Home / Self Care | Payer: 59 | Source: Ambulatory Visit | Attending: Radiation Oncology | Admitting: Radiation Oncology

## 2020-01-21 DIAGNOSIS — C50411 Malignant neoplasm of upper-outer quadrant of right female breast: Secondary | ICD-10-CM | POA: Diagnosis not present

## 2020-01-22 ENCOUNTER — Ambulatory Visit: Payer: 59

## 2020-01-22 ENCOUNTER — Ambulatory Visit
Admission: RE | Admit: 2020-01-22 | Discharge: 2020-01-22 | Disposition: A | Discharge status: Home / Self Care | Payer: 59 | Source: Ambulatory Visit | Attending: Radiation Oncology | Admitting: Radiation Oncology

## 2020-01-22 ENCOUNTER — Other Ambulatory Visit: Payer: Self-pay

## 2020-01-22 DIAGNOSIS — C50411 Malignant neoplasm of upper-outer quadrant of right female breast: Secondary | ICD-10-CM | POA: Diagnosis not present

## 2020-01-23 ENCOUNTER — Ambulatory Visit
Admission: RE | Admit: 2020-01-23 | Discharge: 2020-01-23 | Disposition: A | Discharge status: Home / Self Care | Payer: 59 | Source: Ambulatory Visit | Attending: Radiation Oncology | Admitting: Radiation Oncology

## 2020-01-23 ENCOUNTER — Ambulatory Visit: Payer: 59

## 2020-01-23 ENCOUNTER — Other Ambulatory Visit: Payer: Self-pay

## 2020-01-23 DIAGNOSIS — C50411 Malignant neoplasm of upper-outer quadrant of right female breast: Secondary | ICD-10-CM | POA: Diagnosis not present

## 2020-01-24 ENCOUNTER — Other Ambulatory Visit: Payer: Self-pay

## 2020-01-24 ENCOUNTER — Ambulatory Visit
Admission: RE | Admit: 2020-01-24 | Discharge: 2020-01-24 | Disposition: A | Discharge status: Home / Self Care | Payer: 59 | Source: Ambulatory Visit | Attending: Radiation Oncology | Admitting: Radiation Oncology

## 2020-01-24 ENCOUNTER — Ambulatory Visit: Payer: 59

## 2020-01-24 DIAGNOSIS — C50411 Malignant neoplasm of upper-outer quadrant of right female breast: Secondary | ICD-10-CM | POA: Diagnosis not present

## 2020-01-25 ENCOUNTER — Other Ambulatory Visit: Payer: Self-pay

## 2020-01-25 ENCOUNTER — Ambulatory Visit: Payer: 59

## 2020-01-25 ENCOUNTER — Ambulatory Visit
Admission: RE | Admit: 2020-01-25 | Discharge: 2020-01-25 | Disposition: A | Discharge status: Home / Self Care | Payer: 59 | Source: Ambulatory Visit | Attending: Radiation Oncology | Admitting: Radiation Oncology

## 2020-01-25 DIAGNOSIS — C50411 Malignant neoplasm of upper-outer quadrant of right female breast: Secondary | ICD-10-CM | POA: Diagnosis not present

## 2020-01-28 ENCOUNTER — Ambulatory Visit: Payer: 59

## 2020-01-28 ENCOUNTER — Other Ambulatory Visit: Payer: Self-pay

## 2020-01-28 ENCOUNTER — Ambulatory Visit
Admission: RE | Admit: 2020-01-28 | Discharge: 2020-01-28 | Disposition: A | Discharge status: Home / Self Care | Payer: 59 | Source: Ambulatory Visit | Attending: Radiation Oncology | Admitting: Radiation Oncology

## 2020-01-28 DIAGNOSIS — C50411 Malignant neoplasm of upper-outer quadrant of right female breast: Secondary | ICD-10-CM | POA: Diagnosis not present

## 2020-01-29 ENCOUNTER — Ambulatory Visit: Payer: 59

## 2020-01-29 ENCOUNTER — Other Ambulatory Visit: Payer: Self-pay

## 2020-01-29 ENCOUNTER — Ambulatory Visit
Admission: RE | Admit: 2020-01-29 | Discharge: 2020-01-29 | Disposition: A | Discharge status: Home / Self Care | Payer: 59 | Source: Ambulatory Visit | Attending: Radiation Oncology | Admitting: Radiation Oncology

## 2020-01-29 DIAGNOSIS — C50411 Malignant neoplasm of upper-outer quadrant of right female breast: Secondary | ICD-10-CM | POA: Diagnosis not present

## 2020-01-30 ENCOUNTER — Other Ambulatory Visit: Payer: Self-pay

## 2020-01-30 ENCOUNTER — Ambulatory Visit: Payer: 59

## 2020-01-30 ENCOUNTER — Ambulatory Visit
Admission: RE | Admit: 2020-01-30 | Discharge: 2020-01-30 | Disposition: A | Discharge status: Home / Self Care | Payer: 59 | Source: Ambulatory Visit | Attending: Radiation Oncology | Admitting: Radiation Oncology

## 2020-01-30 DIAGNOSIS — C50411 Malignant neoplasm of upper-outer quadrant of right female breast: Secondary | ICD-10-CM | POA: Diagnosis not present

## 2020-01-31 ENCOUNTER — Ambulatory Visit: Payer: 59

## 2020-01-31 ENCOUNTER — Other Ambulatory Visit: Payer: Self-pay

## 2020-01-31 ENCOUNTER — Ambulatory Visit
Admission: RE | Admit: 2020-01-31 | Discharge: 2020-01-31 | Disposition: A | Discharge status: Home / Self Care | Payer: 59 | Source: Ambulatory Visit | Attending: Radiation Oncology | Admitting: Radiation Oncology

## 2020-01-31 DIAGNOSIS — C50411 Malignant neoplasm of upper-outer quadrant of right female breast: Secondary | ICD-10-CM | POA: Diagnosis not present

## 2020-02-01 ENCOUNTER — Ambulatory Visit: Payer: 59

## 2020-02-01 ENCOUNTER — Ambulatory Visit
Admission: RE | Admit: 2020-02-01 | Discharge: 2020-02-01 | Disposition: A | Discharge status: Home / Self Care | Payer: 59 | Source: Ambulatory Visit | Attending: Radiation Oncology | Admitting: Radiation Oncology

## 2020-02-01 ENCOUNTER — Other Ambulatory Visit: Payer: Self-pay

## 2020-02-01 DIAGNOSIS — C50411 Malignant neoplasm of upper-outer quadrant of right female breast: Secondary | ICD-10-CM

## 2020-02-01 DIAGNOSIS — Z170 Estrogen receptor positive status [ER+]: Secondary | ICD-10-CM

## 2020-02-01 MED ORDER — SILVER SULFADIAZINE 1 % EX CREA: TOPICAL_CREAM | Freq: Once | CUTANEOUS | Status: AC

## 2020-02-04 ENCOUNTER — Other Ambulatory Visit: Payer: Self-pay

## 2020-02-04 ENCOUNTER — Ambulatory Visit: Payer: 59

## 2020-02-04 ENCOUNTER — Ambulatory Visit
Admission: RE | Admit: 2020-02-04 | Discharge: 2020-02-04 | Disposition: A | Discharge status: Home / Self Care | Payer: 59 | Source: Ambulatory Visit | Attending: Radiation Oncology | Admitting: Radiation Oncology

## 2020-02-04 DIAGNOSIS — C50411 Malignant neoplasm of upper-outer quadrant of right female breast: Secondary | ICD-10-CM | POA: Diagnosis not present

## 2020-02-05 ENCOUNTER — Ambulatory Visit: Payer: 59

## 2020-02-05 ENCOUNTER — Other Ambulatory Visit: Payer: Self-pay

## 2020-02-05 ENCOUNTER — Ambulatory Visit
Admission: RE | Admit: 2020-02-05 | Discharge: 2020-02-05 | Disposition: A | Discharge status: Home / Self Care | Payer: 59 | Source: Ambulatory Visit | Attending: Radiation Oncology | Admitting: Radiation Oncology

## 2020-02-05 DIAGNOSIS — C50411 Malignant neoplasm of upper-outer quadrant of right female breast: Secondary | ICD-10-CM | POA: Diagnosis not present

## 2020-02-06 ENCOUNTER — Ambulatory Visit
Admission: RE | Admit: 2020-02-06 | Discharge: 2020-02-06 | Disposition: A | Discharge status: Home / Self Care | Payer: 59 | Source: Ambulatory Visit | Attending: Radiation Oncology | Admitting: Radiation Oncology

## 2020-02-06 ENCOUNTER — Encounter: Payer: Self-pay | Admitting: *Deleted

## 2020-02-06 ENCOUNTER — Other Ambulatory Visit: Payer: Self-pay

## 2020-02-06 ENCOUNTER — Encounter: Payer: Self-pay | Admitting: Radiation Oncology

## 2020-02-06 DIAGNOSIS — C50411 Malignant neoplasm of upper-outer quadrant of right female breast: Secondary | ICD-10-CM | POA: Diagnosis not present

## 2020-02-27 NOTE — Progress Notes (Signed)
  Radiation Oncology         [phone] ________________________________  Name: Veronica [name] MRN: [id number]  Date: 02/06/2020  DOB: 22-Sep-1971  End of Treatment Note  Diagnosis:   right-sided breast cancer     Indication for treatment:  Curative       Radiation treatment dates:   12/24/19 - 02/06/20  Site/dose:   The patient initially received a dose of 50.4 Gy in 28 fractions to the breast and SCLV region using a 4-field approach. This was delivered using a 3-D conformal technique. The patient then received a boost to the seroma. This delivered an additional 10 Gy in 5 fractions using a 3-field photon boost technique. The total dose was 60.4 Gy.  Narrative: The patient tolerated radiation treatment relatively well.   The patient had some expected skin irritation as she progressed during treatment. Moist desquamation was not present at the end of treatment.  Plan: The patient has completed radiation treatment. The patient will return to radiation oncology clinic for routine followup in one month. I advised the patient to call or return sooner if they have any questions or concerns related to their recovery or treatment. ________________________________  Jodelle Gross, M.D., Ph.D.

## 2020-03-04 ENCOUNTER — Telehealth: Payer: Self-pay | Admitting: Radiation Oncology

## 2020-03-04 NOTE — Telephone Encounter (Signed)
  Radiation Oncology         [phone] ________________________________  Name: Veronica [name] MRN: [id number]  Date of Service: 03/26/20  DOB: 1971/02/16  Post Treatment Telephone Note  Diagnosis:  Stage IIA, cT1cN1M0 grade 3, ER/PR positive invasive ductal carcinoma of the right breast.  Interval Since Last Radiation: 7 weeks   12/24/19-02/06/20: The right breast and regional nodes were treated to 50.4 Gy in 28 fractions followed by a 10 Gy boost over 5 fractions.   Narrative:  The patient was contacted today for routine follow-up. During treatment she did very well with radiotherapy and did not have significant desquamation. She reports she is doing very well. She denies any concerns or questions about her skin, and reports everything has healed nicely.  Impression/Plan: 1. Stage IIA, cT1cN1M0 grade 3, ER/PR positive invasive ductal carcinoma of the right breast. The patient has been doing well since completion of radiotherapy. We discussed that we would be happy to continue to follow her as needed, but she will also continue to follow up with Dr. Jana Hakim in medical oncology. She was counseled on skin care as well as measures to avoid sun exposure to this area.  2. Survivorship. We discussed the importance of survivorship evaluation and encouraged her to attend her upcoming visit with that clinic.    Carola Rhine, PAC

## 2020-04-02 ENCOUNTER — Other Ambulatory Visit: Payer: Self-pay | Admitting: *Deleted

## 2020-04-02 MED ORDER — ANASTROZOLE 1 MG PO TABS: 1.0000 mg | ORAL_TABLET | Freq: Every day | ORAL | 4 refills | Status: DC

## 2020-04-23 ENCOUNTER — Other Ambulatory Visit: Payer: Self-pay | Admitting: *Deleted

## 2020-04-23 DIAGNOSIS — Z170 Estrogen receptor positive status [ER+]: Secondary | ICD-10-CM

## 2020-04-24 ENCOUNTER — Inpatient Hospital Stay: Payer: 59

## 2020-04-24 ENCOUNTER — Other Ambulatory Visit: Payer: Self-pay

## 2020-04-24 ENCOUNTER — Inpatient Hospital Stay: Payer: 59 | Attending: Oncology | Admitting: Oncology

## 2020-04-24 VITALS — BP 131/72 | HR 58 | Temp 98.7°F | Resp 18 | Ht 61.0 in | Wt 206.0 lb

## 2020-04-24 DIAGNOSIS — Z86711 Personal history of pulmonary embolism: Secondary | ICD-10-CM | POA: Diagnosis not present

## 2020-04-24 DIAGNOSIS — Z7901 Long term (current) use of anticoagulants: Secondary | ICD-10-CM | POA: Diagnosis not present

## 2020-04-24 DIAGNOSIS — Z9221 Personal history of antineoplastic chemotherapy: Secondary | ICD-10-CM | POA: Insufficient documentation

## 2020-04-24 DIAGNOSIS — Z8616 Personal history of COVID-19: Secondary | ICD-10-CM | POA: Insufficient documentation

## 2020-04-24 DIAGNOSIS — Z923 Personal history of irradiation: Secondary | ICD-10-CM | POA: Insufficient documentation

## 2020-04-24 DIAGNOSIS — Z170 Estrogen receptor positive status [ER+]: Secondary | ICD-10-CM

## 2020-04-24 DIAGNOSIS — C50411 Malignant neoplasm of upper-outer quadrant of right female breast: Secondary | ICD-10-CM | POA: Insufficient documentation

## 2020-04-24 DIAGNOSIS — Z86718 Personal history of other venous thrombosis and embolism: Secondary | ICD-10-CM | POA: Diagnosis not present

## 2020-04-24 DIAGNOSIS — C773 Secondary and unspecified malignant neoplasm of axilla and upper limb lymph nodes: Secondary | ICD-10-CM | POA: Insufficient documentation

## 2020-04-24 LAB — CMP (CANCER CENTER ONLY)
ALT: 20 U/L (ref 0–44)
AST: 23 U/L (ref 15–41)
Albumin: 3.8 g/dL (ref 3.5–5.0)
Alkaline Phosphatase: 74 U/L (ref 38–126)
Anion gap: 8 (ref 5–15)
BUN: 13 mg/dL (ref 6–20)
CO2: 25 mmol/L (ref 22–32)
Calcium: 9.4 mg/dL (ref 8.9–10.3)
Chloride: 107 mmol/L (ref 98–111)
Creatinine: 0.75 mg/dL (ref 0.44–1.00)
GFR, Est AFR Am: >60 mL/min (ref >60)
GFR, Estimated: >60 mL/min (ref >60)
Glucose, Bld: 105 mg/dL — ABNORMAL HIGH (ref 70–99)
Potassium: 4.1 mmol/L (ref 3.5–5.1)
Sodium: 140 mmol/L (ref 135–145)
Total Bilirubin: 1.1 mg/dL (ref 0.3–1.2)
Total Protein: 6.8 g/dL (ref 6.5–8.1)

## 2020-04-24 LAB — CBC WITH DIFFERENTIAL (CANCER CENTER ONLY)
Abs Immature Granulocytes: 0.03 10*3/uL (ref 0.00–0.07)
Basophils Absolute: 0 10*3/uL (ref 0.0–0.1)
Basophils Relative: 1 %
Eosinophils Absolute: 0.2 10*3/uL (ref 0.0–0.5)
Eosinophils Relative: 3 %
HCT: 42.1 % (ref 36.0–46.0)
Hemoglobin: 14.4 g/dL (ref 12.0–15.0)
Immature Granulocytes: 1 %
Lymphocytes Relative: 16 %
Lymphs Abs: 0.9 10*3/uL (ref 0.7–4.0)
MCH: 29.8 pg (ref 26.0–34.0)
MCHC: 34.2 g/dL (ref 30.0–36.0)
MCV: 87 fL (ref 80.0–100.0)
Monocytes Absolute: 0.5 10*3/uL (ref 0.1–1.0)
Monocytes Relative: 9 %
Neutro Abs: 3.8 10*3/uL (ref 1.7–7.7)
Neutrophils Relative %: 70 %
Platelet Count: 170 10*3/uL (ref 150–400)
RBC: 4.84 MIL/uL (ref 3.87–5.11)
RDW: 11.9 % (ref 11.5–15.5)
WBC Count: 5.3 10*3/uL (ref 4.0–10.5)
nRBC: 0 % (ref 0.0–0.2)

## 2020-04-24 NOTE — Progress Notes (Signed)
[place]  [phone] [phone]) [phone]     ID: DIMOND CROTTY DOB: [birth year]  MR#: [id number]  KKX#:[id number]  Patient Care Team: Blair Heys, Hershal Coria as PCP - General (Physician Assistant) Mauro Kaufmann, RN as Oncology Nurse Navigator [name], RN as Oncology Nurse Navigator Erroll Luna, MD as Consulting Physician (General Surgery) Holli Rengel, Virgie Dad, MD as Consulting Physician (Oncology) Kyung Rudd, MD as Consulting Physician (Radiation Oncology) Gaye Pollack, MD as Consulting Physician (Cardiothoracic Surgery) Chauncey Cruel, MD OTHER MD:  CHIEF COMPLAINT: Estrogen receptor positive breast cancer  CURRENT TREATMENT:  anastrozole   INTERVAL HISTORY: Thomas returns today for follow-up and treatment of her estrogen receptor positive breast cancer. She was last seen here on 01/14/2020.   She started anastrozole May 2021.  She is tolerating this well, with no unusual side effects or vaginal dryness problems.  She has not had arthralgias or myalgias associated with this.  She does not have a bone density screening on file.  Since her last visit here, she completed adjuvant radiation lasting from 12/24/2019 to 02/06/2020. The patient initially received a dose of 50.4 Gy in 28 fractions to the breast and SCLV region using a 4-field approach. This was delivered using a 3-D conformal technique. The patient then received a boost to the seroma. This delivered an additional 10 Gy in 5 fractions using a 3-field photon boost technique. The total dose was 60.4 Gy.    REVIEW OF SYSTEMS: Jalayiah tells me she walks about 2 miles most days.  She has normal activity at present.  She tolerated her radiation oncology with some fatigue and some skin changes all of which has resolved.  She has not yet had the COVID-19 vaccine.  She continues on rivaroxaban and has had no bleeding or bruising issues.  She has had no cough phlegm production pleurisy or shortness  of breath.  A detailed review of systems today was otherwise stable  HISTORY OF CURRENT ILLNESS: From the original intake note:  ASRA GAMBREL had routine screening mammography on 04/27/2019 showing a possible abnormality in the right breast. She underwent right diagnostic mammography with tomography and right breast ultrasonography at [facility] on 05/01/2019 showing: breast density category C; highly suspicious 1.7 cm irregular mass in the right breast at 10 o'clock, 8 cm from the nipple; indeterminate hypoechoic round mass in the right axillary tail/low axilla. Physical exam showed no suspicious lumps.  Accordingly on 05/02/2019 she proceeded to biopsy of the right breast area in question. The pathology from this procedure ([id number]) showed: invasive ductal carcinoma, grade 3; ductal carcinoma in situ; lymphovascular invasion present. Prognostic indicators significant for: estrogen receptor, 30% positive with moderate staining intensity, and progesterone receptor, 30% positive with strong staining intensity. Proliferation marker Ki67 at 20%. HER2 negative by immunohistochemistry (1+).  The second "satellite" lesion was a lymph node which was also positive.  The patient's subsequent history is as detailed below.   PAST MEDICAL HISTORY: Past Medical History:  Diagnosis Date  . Cancer ([place])   . Chronic low back pain with left-sided sciatica   . Family history of leukemia   . Family history of stomach cancer   . Hypertension     PAST SURGICAL HISTORY: Past Surgical History:  Procedure Laterality Date  . APPLICATION OF WOUND VAC N/A 10/16/2019   Procedure: APPLICATION OF WOUND VAC;  Surgeon: Gaye Pollack, MD;  Location: [facility];  Service: Vascular;  Laterality: N/A;  . BREAST BIOPSY  Right 05/02/2019   right clips X2  . BREAST LUMPECTOMY WITH RADIOACTIVE SEED AND SENTINEL LYMPH NODE BIOPSY Right 06/12/2019   Procedure: RIGHT BREAST RADIOACTIVE SEED LUMPECTOMY  AND RIGHT AXILLARY SEED  TARGETED LYMPH NODE AND AXILLARY SENTINEL LYMPH NODE MAPPING;  Surgeon: Harriette Bouillon, MD;  Location: [facility];  Service: General;  Laterality: Right;  PEC BLOCK  . C/S x2  '98 '03  . CENTRAL VENOUS CATHETER INSERTION  10/02/2019   Procedure: Insertion Central Line Adult;  Surgeon: Nada Libman, MD;  Location: [facility] OR;  Service: Vascular;;  . CESAREAN SECTION    . CHOLECYSTECTOMY    . I & D EXTREMITY N/A 10/16/2019   Procedure: DEBRIDEMENT of DEHISCED MIDLINE SURGICAL INCISION;  Surgeon: Alleen Borne, MD;  Location: [facility] OR;  Service: Vascular;  Laterality: N/A;  . LAPAROSCOPIC ASSISTED VAGINAL HYSTERECTOMY  05/17/2011   Procedure: LAPAROSCOPIC ASSISTED VAGINAL HYSTERECTOMY;  Surgeon: Mickel Baas;  Location: [facility];  Service: Gynecology;  Laterality: N/A;  Laparoscopic Assisted Vaginal Hysterectomy With Lysis Of Adhesions  . PERICARDIAL WINDOW N/A 10/02/2019   Procedure: Pericardial Window;  Surgeon: Nada Libman, MD;  Location: [facility] OR;  Service: Vascular;  Laterality: N/A;  . PORT-A-CATH REMOVAL  10/02/2019   Procedure: Removal Port-A-Cath;  Surgeon: Nada Libman, MD;  Location: [facility] OR;  Service: Vascular;;  . PORTACATH PLACEMENT Right 06/12/2019   Procedure: INSERTION PORT-A-CATH WITH ULTRASOUND;  Surgeon: Harriette Bouillon, MD;  Location: [facility];  Service: General;  Laterality: Right;  . RE-EXCISION OF BREAST LUMPECTOMY Right 07/17/2019   Procedure: RE-EXCISION OF RIGHT BREAST LUMPECTOMY;  Surgeon: Harriette Bouillon, MD;  Location: [facility];  Service: General;  Laterality: Right;  . TUBAL LIGATION    . ULTRASOUND GUIDANCE FOR VASCULAR ACCESS  10/02/2019   Procedure: Ultrasound Guidance For Vascular Access;  Surgeon: Nada Libman, MD;  Location: [facility] OR;  Service: Vascular;;  . VENOGRAM N/A 10/02/2019   Procedure: Central VENOGRAM;  Surgeon: Nada Libman, MD;  Location: [facility] OR;  Service: Vascular;  Laterality: N/A;    FAMILY  HISTORY: Family History  Problem Relation Age of Onset  . Arthritis Mother   . COPD Mother   . Hypertension Mother   . Hyperlipidemia Mother   . Leukemia Brother 18  . Stomach cancer Maternal Grandmother        late 70s   Patient's parents are living (as of September 2020). Her father is 75 and her mother is 30 as of 04/2019. The patient denies a family hx of breast or ovarian cancer. She has 7 siblings, 6 brothers and 1 sister. She reports her maternal grandmother was diagnosed with stomach cancer at age 24. Her brother was diagnosed with leukemia at age 12.   GYNECOLOGIC HISTORY:  No LMP recorded. Patient has had a hysterectomy. Menarche: 49 years old Age at first live birth: 49 years old GX P 2 (+1 stillborn) LMP age 32-42 Contraceptive: unsure, maybe for a year at age 72. HRT no Hysterectomy? Yes, 05/2011 BSO? 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 She is married. Husband Anders Grant") is a Market researcher for a telephone company. She lives at home with her husband and two daughters. Daughter Darrel Reach, age 54, is a Conservation officer, nature. Daughter Dahlia Client, age 20, is a Consulting civil engineer.     ADVANCED DIRECTIVES: Husband Gaynelle Adu is her HCPOA.   HEALTH MAINTENANCE: Social History   Tobacco Use  . Smoking  status: Never Smoker  . Smokeless tobacco: Never Used  Vaping Use  . Vaping Use: Never used  Substance Use Topics  . Alcohol use: Yes    Alcohol/week: 2.0 standard drinks    Types: 2 Standard drinks or equivalent per week    Comment: "2-3 a week"  . Drug use: No     Colonoscopy: n/a  PAP: 02/2018  Bone density: n/a   No Known Allergies  Current Outpatient Medications  Medication Sig Dispense Refill  . anastrozole (ARIMIDEX) 1 MG tablet Take 1 tablet (1 mg total) by mouth daily. 90 tablet 4  . Cholecalciferol (VITAMIN D3) 25 MCG (1000 UT) CHEW Chew 1 tablet by mouth daily.     . fluconazole (DIFLUCAN) 100 MG tablet Take 1 tablet (100 mg  total) by mouth daily. 20 tablet 0  . ketoconazole (NIZORAL) 2 % cream Apply 1 application topically daily. 15 g 0  . metoprolol tartrate (LOPRESSOR) 25 MG tablet Take 1 tablet (25 mg total) by mouth 2 (two) times daily. 60 tablet 0  . rivaroxaban (XARELTO) 20 MG TABS tablet Take 1 tablet (20 mg total) by mouth daily with supper. 90 tablet 3   No current facility-administered medications for this visit.    OBJECTIVE: white woman who appears stated age  22:   04/24/20 1050  BP: 131/72  Pulse: (!) 58  Resp: 18  Temp: 98.7 F (37.1 C)  SpO2: 100%   Wt Readings from Last 3 Encounters:  04/24/20 206 lb (93.4 kg)  01/14/20 203 lb 1.6 oz (92.1 kg)  12/12/19 206 lb (93.4 kg)   Body mass index is 38.92 kg/m.    ECOG FS:1 - Symptomatic but completely ambulatory  Ocular: Sclerae unicteric, pupils round and equal Ear-nose-throat: Wearing a mask Lymphatic: No cervical or supraclavicular adenopathy Lungs no rales or rhonchi Heart regular rate and rhythm Abd soft, nontender, positive bowel sounds MSK no focal spinal tenderness, no joint edema Neuro: non-focal, well-oriented, appropriate affect Breasts: The right breast is status post lumpectomy and radiation.  There is the expected skin coarsening but no erythema and no evidence of local recurrence or residual disease.  The left breast is benign.  Both axillae are benign.  LAB RESULTS:  CMP     Component Value Date/Time   NA 144 01/14/2020 1123   K 4.6 01/14/2020 1123   CL 108 01/14/2020 1123   CO2 25 01/14/2020 1123   GLUCOSE 113 (H) 01/14/2020 1123   BUN 14 01/14/2020 1123   CREATININE 0.76 01/14/2020 1123   CALCIUM 9.9 01/14/2020 1123   PROT 6.7 01/14/2020 1123   ALBUMIN 3.9 01/14/2020 1123   AST 24 01/14/2020 1123   ALT 24 01/14/2020 1123   ALKPHOS 63 01/14/2020 1123   BILITOT 1.1 01/14/2020 1123   GFRNONAA >60 01/14/2020 1123   GFRAA >60 01/14/2020 1123    No results found for: TOTALPROTELP, ALBUMINELP, A1GS,  A2GS, BETS, BETA2SER, GAMS, MSPIKE, SPEI  No results found for: KPAFRELGTCHN, LAMBDASER, KAPLAMBRATIO  Lab Results  Component Value Date   WBC 5.3 04/24/2020   NEUTROABS 3.8 04/24/2020   HGB 14.4 04/24/2020   HCT 42.1 04/24/2020   MCV 87.0 04/24/2020   PLT 170 04/24/2020   No results found for: LABCA2  No components found for: LGXQJJ941  No results for input(s): INR in the last 168 hours.  No results found for: LABCA2  No results found for: DEY814  No results found for: GYJ856  No results found for: DJS970  No  results found for: CA2729  No components found for: HGQUANT  No results found for: CEA1 / No results found for: CEA1   No results found for: AFPTUMOR  No results found for: CHROMOGRNA  No results found for: HGBA, HGBA2QUANT, HGBFQUANT, HGBSQUAN (Hemoglobinopathy evaluation)   Lab Results  Component Value Date   LDH 170 09/30/2019    No results found for: IRON, TIBC, IRONPCTSAT (Iron and TIBC)  Lab Results  Component Value Date   FERRITIN 498 (H) 10/19/2019    Urinalysis    Component Value Date/Time   COLORURINE YELLOW 08/28/2019 2326   APPEARANCEUR CLOUDY (A) 08/28/2019 2326   LABSPEC 1.013 08/28/2019 2326   PHURINE 5.0 08/28/2019 2326   GLUCOSEU NEGATIVE 08/28/2019 2326   HGBUR MODERATE (A) 08/28/2019 2326   BILIRUBINUR NEGATIVE 08/28/2019 2326   KETONESUR NEGATIVE 08/28/2019 2326   PROTEINUR NEGATIVE 08/28/2019 2326   NITRITE NEGATIVE 08/28/2019 2326   LEUKOCYTESUR MODERATE (A) 08/28/2019 2326    STUDIES: No results found.   ELIGIBLE FOR AVAILABLE RESEARCH PROTOCOL: AET  ASSESSMENT: 49 y.o. [place], [place] woman status post right breast upper outer quadrant biopsy 05/01/2019 for a clinical T1c N1, stage IIA invasive ductal carcinoma, grade 3, estrogen and progesterone receptor positive, HER-2 nonamplified, with an MIB-1-1 of 20%.  (a) mass in the axillary tail was a positive lymph node  (1) MammaPrint obtained from the original  biopsy shows a high risk luminal B tumor and predicts a 5-year disease-free survival of 91% in her case  (2) genetics testing 05/09/2019 through the Common Hereditary Gene Panel offered by [facility] found no deleterious mutations in APC, ATM, AXIN2, BARD1, BMPR1A, BRCA1, BRCA2, BRIP1, CDH1, CDK4, CDKN2A (p14ARF), CDKN2A (p16INK4a), CHEK2, CTNNA1, DICER1, EPCAM (Deletion/duplication testing only), GREM1 (promoter region deletion/duplication testing only), KIT, MEN1, MLH1, MSH2, MSH3, MSH6, MUTYH, NBN, NF1, NHTL1, PALB2, PDGFRA, PMS2, POLD1, POLE, PTEN, RAD50, RAD51C, RAD51D, RNF43, SDHB, SDHC, SDHD, SMAD4, SMARCA4. STK11, TP53, TSC1, TSC2, and VHL.  The following genes were evaluated for sequence changes only: SDHA and HOXB13 c.251G>A variant only.   (a) A variant of uncertain significance (VUS) was detected in one of her MSH6 genes (c.831A>C).  (3) status post right lumpectomy and sentinel lymph node sampling 06/12/2019 for a pT2 pN1, stage IIA invasive ductal carcinoma, grade 2, with positive margins  (a) a total of 4 sentinel lymph nodes removed, one positive (with ECE), ine itc  (b) margin clearance 04/19/2019 successful (medial margin close but negative for DCIS)  (4) adjuvant chemotherapy consisting of doxorubicin and cyclophosphamide in dose dense fashion x4 starting 07/10/2019, completed 09/24/2019; planned weekly paclitaxel x12 omitted   (a) echo 06/26/2019 shows an EF of 60-65%  (b echo on 09/19/2019 shows an EF of 60-65%  (c) chemotherapy stopped after four cycles of doxorubicin and cyclophosphamide due to #5 below  (5) Multiple VTE/PE documented 10/02/2019, on intravenous heparin initially, then Xarelto started 10/19/2019  (a) presented with SVC syndrome and bilateral pulmonary emboli on 09/28/2019  (b) s/p SVC thrombectomy and port removal 35/36/1443 complicated by hemopericardium and tamponade, necessitating pericardial window placement   (c) postop course complicated by wound dehiscence  requiring wound VAC  (c) Doppler 10/03/2019 showed right lower extremity DVT  (d) CT angio and Doppler of both upper extremities 10/13/2019 found persistent acute bilateral pulmonary emboli, persistent but improved SVC thrombus, bilateral pleural effusions, and right lower lobe collapse. Bilateral upper extremity DVTs also noted  (e) Dopplers upper extremity 11/19/2019 showed clots cleared on the left, chronic DVT right internal  jugular and axillary veins  (6) COVID-19 infection documented 09/27/2020, status post remdesivir  (7) adjuvant radiation 12/24/2019 - 02/06/2020  (8) anastrozole started mid May 2021  (a) [facility] and estradiol obtained 11/15/2019 consistent with menopause  (b) not a good candidate for tamoxifen given history of DVT/PE above   PLAN:  Jalysa will soon be a year out from definitive surgery for her breast cancer, with no evidence of disease recurrence.  This is favorable.  She is tolerating anastrozole well and the plan will be to continue that most likely 7 years.  She is having no symptoms related to her pulmonary emboli and she is tolerating rivaroxaban well.  When she sees me again in October we will do a D-dimer and if that is normal we will consider repeating some scans and perhaps going off treatment after 1 year total therapy.  I have added a bone density to her mammography in October  She knows to call for any other issue that may develop before the next visit.  Total encounter time 35 minutes.Sarajane Jews C. Madysin Crisp, MD Medical Oncology and Hematology [facility] [place], [place] [address] Tel. [phone]    Fax. [phone]   I, Jacqualyn Posey am acting as a Education administrator for Chauncey Cruel, MD.   I, Lurline Del MD, have reviewed the above documentation for accuracy and completeness, and I agree with the above.      *Total Encounter Time as defined by the Centers for Medicare and Medicaid Services includes, in addition  to the face-to-face time of a patient visit (documented in the note above) non-face-to-face time: obtaining and reviewing outside history, ordering and reviewing medications, tests or procedures, care coordination (communications with other health care professionals or caregivers) and documentation in the medical record.

## 2020-06-23 ENCOUNTER — Ambulatory Visit (INDEPENDENT_AMBULATORY_CARE_PROVIDER_SITE_OTHER): Payer: 59 | Admitting: Surgery

## 2020-06-23 ENCOUNTER — Other Ambulatory Visit: Payer: Self-pay

## 2020-06-23 ENCOUNTER — Ambulatory Visit (HOSPITAL_COMMUNITY)
Admission: RE | Admit: 2020-06-23 | Discharge: 2020-06-23 | Disposition: A | Discharge status: Home / Self Care | Payer: 59 | Source: Ambulatory Visit | Attending: Surgery | Admitting: Surgery

## 2020-06-23 ENCOUNTER — Encounter: Payer: Self-pay | Admitting: Surgery

## 2020-06-23 VITALS — BP 113/79 | HR 81 | Temp 98.2°F | Resp 20 | Ht 61.0 in | Wt 211.8 lb

## 2020-06-23 DIAGNOSIS — I871 Compression of vein: Secondary | ICD-10-CM | POA: Diagnosis present

## 2020-06-23 NOTE — Progress Notes (Signed)
[facility]  Patient name: Veronica [name] MRN: [id number] DOB: 06/22/1971 Sex: female   REASON FOR VISIT:    Follow up  HISOTRY OF PRESENT ILLNESS:    Loretha Ure Subotnikis a 49 y.o.female, whopresented to the ER in December 2020 with facial and upper extremity swelling for several days. CTA revealed  an occluded SVC. She has a port in place for chemo for breast cancer. She is currently ongoing treatment. She tested positive for COVID. She was saturating 100% on room air.She was admitted and placed on IV heparin.  Her neck and face swelling improved with heparin however her arms remained edematous.  Because of her symptoms, I took her to the operating room on 10/02/2019 and perform mechanical thrombectomy of the right subclavian and brachiocephalic vein as well as the superior vena cava.  I also removed her right internal jugular Port-A-Cath.  She became somewhat unstable with hypotension.  I had a transesophageal echo in place and we noticed an enlarging pericardial effusion.  Dr. Cyndia Bent came in and performed an emergency pericardial window.  She has recovered nicely from her operation.    She remains on anticoagulation.  She no longer complains of arm face or neck swelling.  PAST MEDICAL HISTORY:   Past Medical History:  Diagnosis Date  . Cancer ([place])   . Chronic low back pain with left-sided sciatica   . Family history of leukemia   . Family history of stomach cancer   . Hypertension      FAMILY HISTORY:   Family History  Problem Relation Age of Onset  . Arthritis Mother   . COPD Mother   . Hypertension Mother   . Hyperlipidemia Mother   . Leukemia Brother 73  . Stomach cancer Maternal Grandmother        late 39s    SOCIAL HISTORY:   Social History   Tobacco Use  . Smoking status: Never Smoker  . Smokeless tobacco: Never Used  Substance Use Topics  . Alcohol use: Yes    Alcohol/week: 2.0  standard drinks    Types: 2 Standard drinks or equivalent per week    Comment: "2-3 a week"     ALLERGIES:   No Known Allergies   CURRENT MEDICATIONS:   Current Outpatient Medications  Medication Sig Dispense Refill  . anastrozole (ARIMIDEX) 1 MG tablet Take 1 tablet (1 mg total) by mouth daily. 90 tablet 4  . Cholecalciferol (VITAMIN D3) 25 MCG (1000 UT) CHEW Chew 1 tablet by mouth daily.     . metoprolol tartrate (LOPRESSOR) 25 MG tablet Take 1 tablet (25 mg total) by mouth 2 (two) times daily. 60 tablet 0  . rivaroxaban (XARELTO) 20 MG TABS tablet Take 1 tablet (20 mg total) by mouth daily with supper. 90 tablet 3   No current facility-administered medications for this visit.    REVIEW OF SYSTEMS:   [X]  denotes positive finding, [ ]  denotes negative finding Cardiac  Comments:  Chest pain or chest pressure:    Shortness of breath upon exertion:    Short of breath when lying flat:    Irregular heart rhythm:        Vascular    Pain in calf, thigh, or hip brought on by ambulation:    Pain in feet at night that wakes you up from your sleep:     Blood clot in your veins:    Leg swelling:         Pulmonary  Oxygen at home:    Productive cough:     Wheezing:         Neurologic    Sudden weakness in arms or legs:     Sudden numbness in arms or legs:     Sudden onset of difficulty speaking or slurred speech:    Temporary loss of vision in one eye:     Problems with dizziness:         Gastrointestinal    Blood in stool:     Vomited blood:         Genitourinary    Burning when urinating:     Blood in urine:        Psychiatric    Major depression:         Hematologic    Bleeding problems:    Problems with blood clotting too easily:        Skin    Rashes or ulcers:        Constitutional    Fever or chills:      PHYSICAL EXAM:   Vitals:   06/23/20 1438  BP: 113/79  Pulse: 81  Resp: 20  Temp: 98.2 F (36.8 C)  SpO2: 98%  Weight: 211 lb 12.8 oz  (96.1 kg)  Height: 5\' 1"  (1.549 m)    GENERAL: The patient is a well-nourished female, in no acute distress. The vital signs are documented above. CARDIAC: There is a regular rate and rhythm.  VASCULAR: No significant upper extremity swelling PULMONARY: Non-labored respirations MUSCULOSKELETAL: There are no major deformities or cyanosis. NEUROLOGIC: No focal weakness or paresthesias are detected. SKIN: There are no ulcers or rashes noted. PSYCHIATRIC: The patient has a normal affect.  STUDIES:   I have reviewed the following UE venous duplex: Findings consistent with chronic deep vein thrombosis involving the right  internal jugular vein and right axillary vein.    Left:  No evidence of deep vein thrombosis in the upper extremity.  MEDICAL ISSUES:   She is getting follow-up cancer imaging consisting of a mammogram and bone density study next week.  She is remaining on anastrozole.  I have encouraged her to remain on anticoagulation as long as she is on anticancer, or hormonal medications, and until she is deemed cancer free.  I have her scheduled for follow-up with me in 1 year.    Leia Alf, MD, FACS [facility] [phone] Pager [phone]

## 2020-07-16 ENCOUNTER — Other Ambulatory Visit: Payer: Self-pay | Admitting: Oncology

## 2020-07-16 ENCOUNTER — Telehealth: Payer: Self-pay

## 2020-07-16 ENCOUNTER — Other Ambulatory Visit: Payer: Self-pay

## 2020-07-16 ENCOUNTER — Ambulatory Visit (HOSPITAL_COMMUNITY)
Admission: RE | Admit: 2020-07-16 | Discharge: 2020-07-16 | Disposition: A | Discharge status: Home / Self Care | Payer: 59 | Source: Ambulatory Visit | Attending: Oncology | Admitting: Oncology

## 2020-07-16 DIAGNOSIS — Z170 Estrogen receptor positive status [ER+]: Secondary | ICD-10-CM | POA: Insufficient documentation

## 2020-07-16 DIAGNOSIS — C50411 Malignant neoplasm of upper-outer quadrant of right female breast: Secondary | ICD-10-CM | POA: Diagnosis not present

## 2020-07-16 IMAGING — CT CT HEAD W/O CM
3 series · 16 of 47 positions shown, 19 images · non-contrast
Comparison: [DATE]

CLINICAL DATA: Fall today with head trauma. Dizziness. History of
breast cancer.

EXAM:
CT HEAD WITHOUT CONTRAST
TECHNIQUE: Contiguous axial images were obtained from the base of the skull
through the vertex without intravenous contrast.

[Series 2: head wo · axial · 0.47mm/px · z∈[+1570,+1710]mm · 10 of 34 slices shown, 13 images]
[im 3/34  brain]
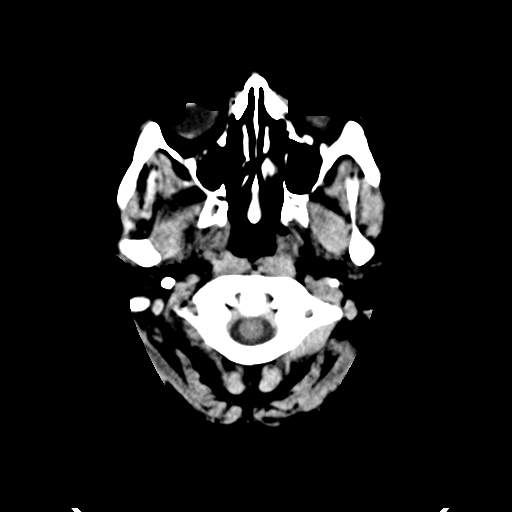
[im 3/34  bone]
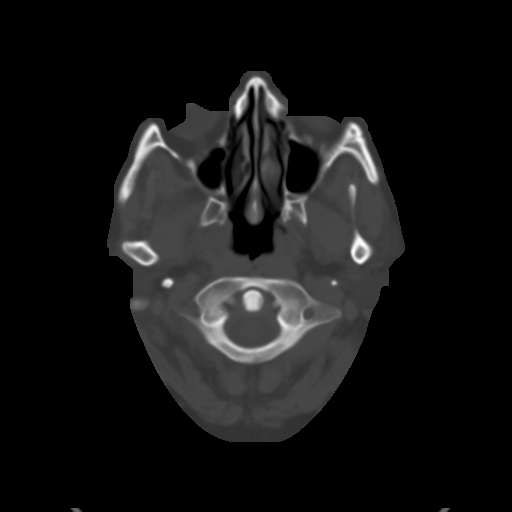
[im 6/34  brain]
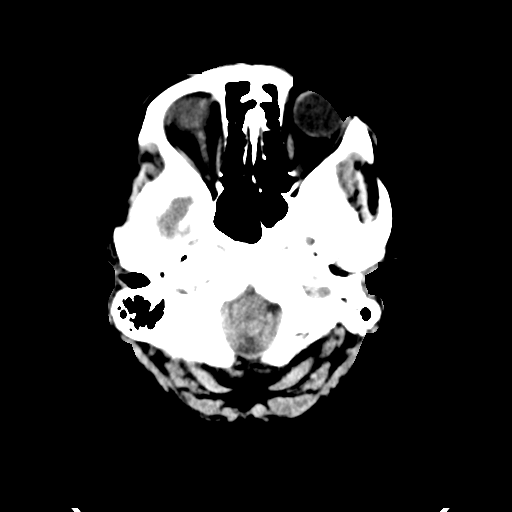
[im 10/34  brain]
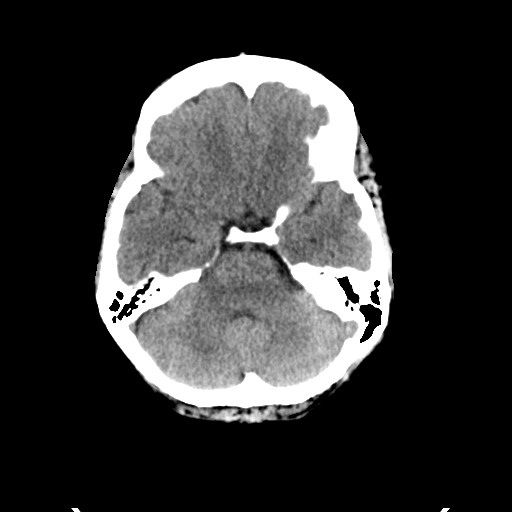
[im 12/34  brain]
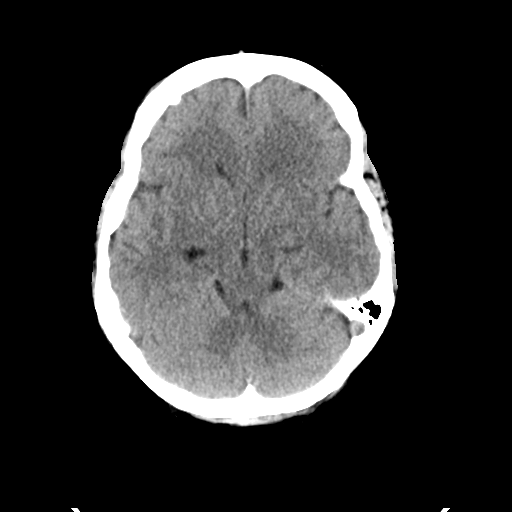
[im 15/34  brain]
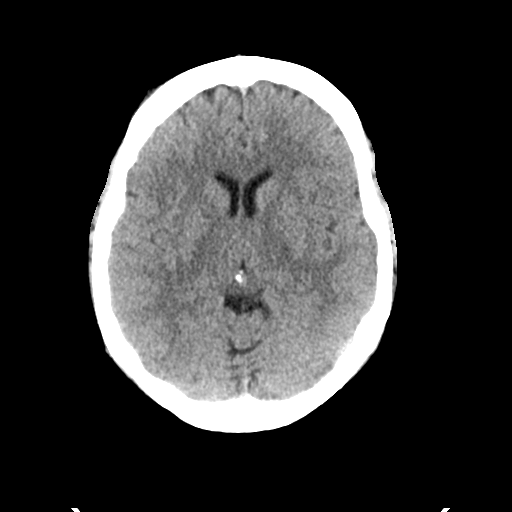
[im 15/34  bone]
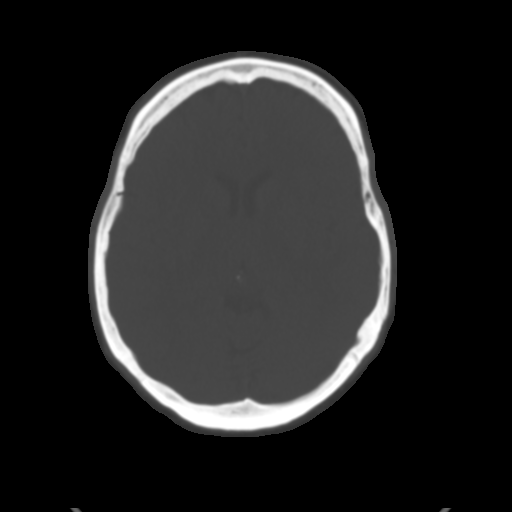
[im 19/34  brain]
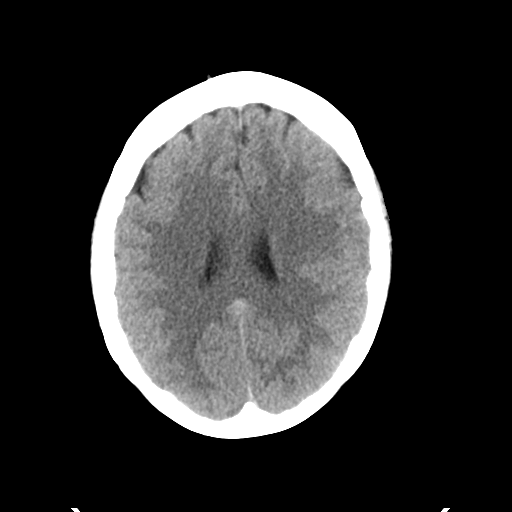
[im 22/34  brain]
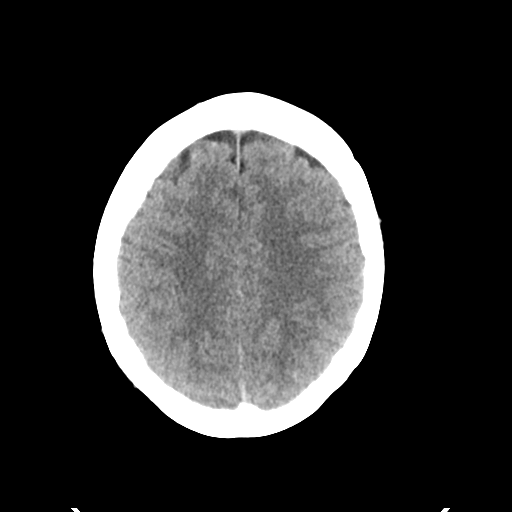
[im 26/34  brain]
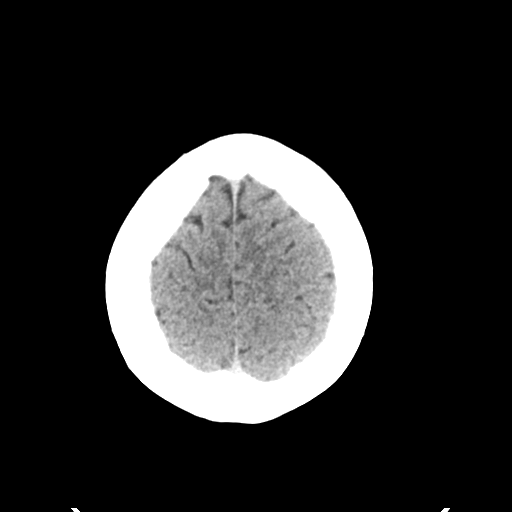
[im 28/34  brain]
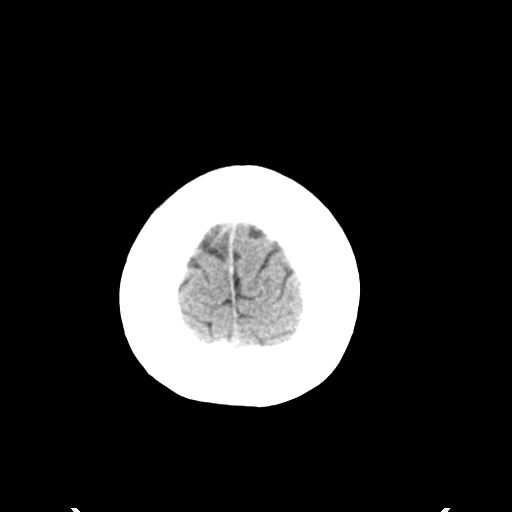
[im 28/34  bone]
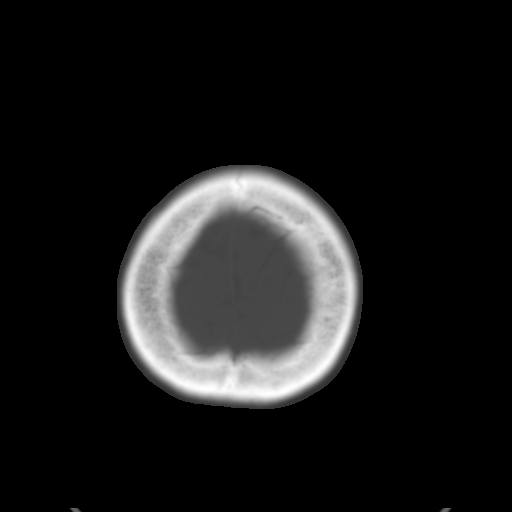
[im 31/34  brain]
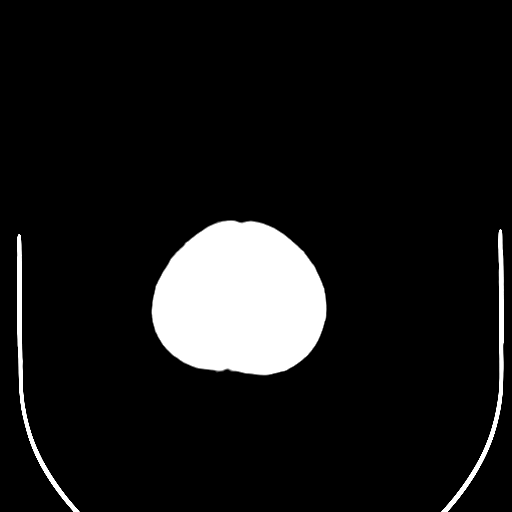

[Series 4: coronal soft tissue · coronal · 0.35mm/px · 3 of 72 slices shown]
[im 24/72  brain]
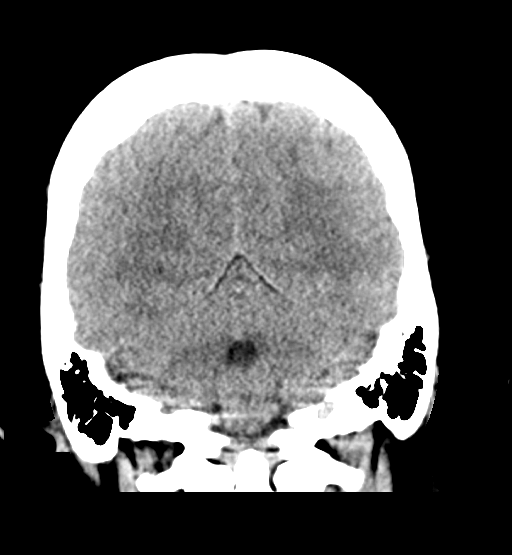
[im 32/72  brain]
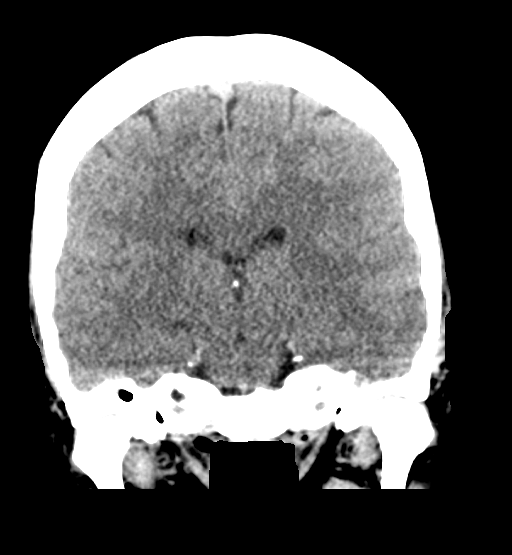
[im 40/72  brain]
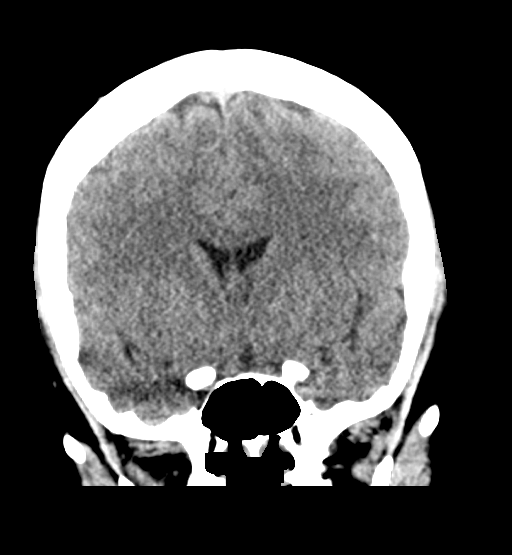

[Series 5: sagittal soft tissue · sagittal · 0.37mm/px · 3 of 53 slices shown]
[im 18/53  brain]
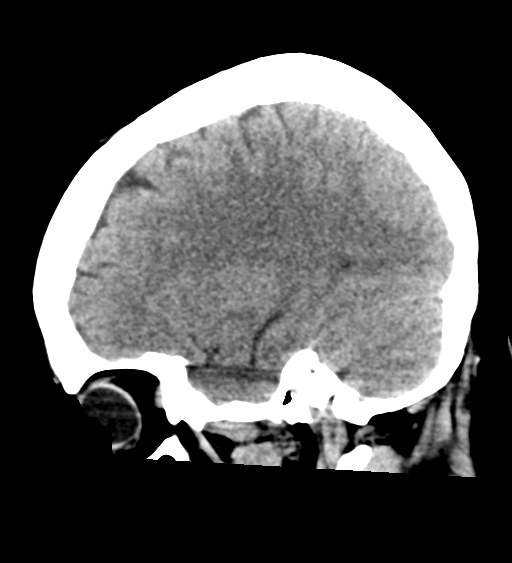
[im 27/53  brain]
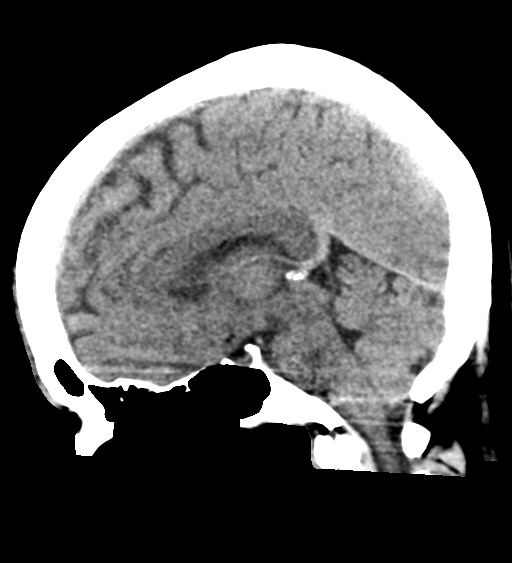
[im 35/53  brain]
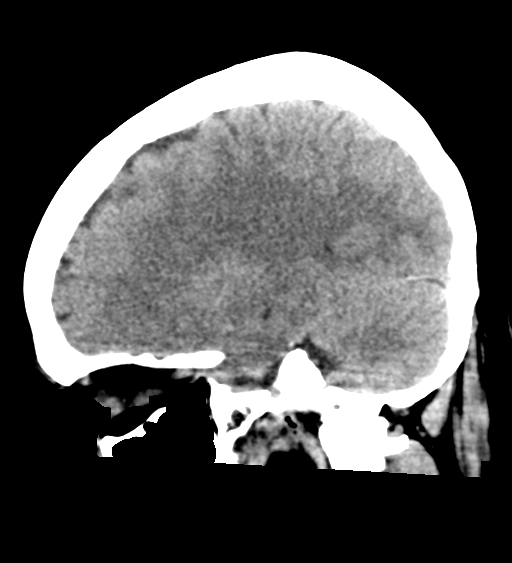

[16 of 47 positions shown; findings below may reference images not displayed]

FINDINGS: Brain: The brain shows a normal appearance without evidence of
malformation, atrophy, old or acute small or large vessel
infarction, mass lesion, hemorrhage, hydrocephalus or extra-axial
collection.

Vascular: No hyperdense vessel. No evidence of atherosclerotic
calcification.

Skull: Normal.  No traumatic finding.  No focal bone lesion.

Sinuses/Orbits: Sinuses are clear. Orbits appear normal. Mastoids
are clear.

Other: None significant
IMPRESSION: Normal head CT.

## 2020-07-16 NOTE — Telephone Encounter (Signed)
Pt called to report that she has developed a headache since our last conversation.  (See previous encounter.)  RN reviewed with MD - MD recommendations for CT head without contrast.   RN notified patient, patient verbalized agreement.  Orders placed, awaiting authorization and will schedule for 10/6 or 10/7 per MD.

## 2020-07-16 NOTE — Progress Notes (Signed)
Per MD recommendations orders placed for CT head without contrast.

## 2020-07-16 NOTE — Telephone Encounter (Signed)
Patient called to report hitting her head on cabinet this AM and is on blood thinner.  Pt inquiring if she needs to "get this checked out since I'm on a blood thinner."    Pt reports bending down and hitting her forehead on cabinet.counter this AM.  Pt denies losing consciousness.  Pt A & O X 4 during phone conversation.  Denies any nausea, vomiting, dizziness, or headache.  Denies any abnormal bruising or bleeding at site.    RN reviewed with MD -  MD OK for patient to be monitored at home with education on signs and symptoms that were reviewed with patient.  Pt verbalized understanding and agreement.  Pt with her mom at her house as well to monitor.  Pt will call back if any changes.

## 2020-07-16 NOTE — Telephone Encounter (Signed)
Pt will be worked in today for STAT CT head, patient verbalized understanding.

## 2020-07-23 ENCOUNTER — Telehealth: Payer: Self-pay

## 2020-07-23 MED ORDER — METOPROLOL TARTRATE 25 MG PO TABS: 25.0000 mg | ORAL_TABLET | Freq: Two times a day (BID) | ORAL | 0 refills | Status: AC

## 2020-07-23 NOTE — Telephone Encounter (Signed)
Per MD okay to provide 30 day courtesy refill for metoprolol as patient is in between primary care physicians.     RN encouraged patient to establish care with PCP, patient is in process of getting appointments.

## 2020-07-30 ENCOUNTER — Other Ambulatory Visit: Payer: Self-pay | Admitting: *Deleted

## 2020-08-01 ENCOUNTER — Other Ambulatory Visit: Payer: Self-pay

## 2020-08-01 ENCOUNTER — Ambulatory Visit
Admission: RE | Admit: 2020-08-01 | Discharge: 2020-08-01 | Disposition: A | Discharge status: Home / Self Care | Payer: 59 | Source: Ambulatory Visit | Attending: Oncology | Admitting: Oncology

## 2020-08-01 DIAGNOSIS — Z170 Estrogen receptor positive status [ER+]: Secondary | ICD-10-CM

## 2020-08-01 DIAGNOSIS — C50411 Malignant neoplasm of upper-outer quadrant of right female breast: Secondary | ICD-10-CM

## 2020-08-01 HISTORY — DX: Personal history of irradiation: Z92.3

## 2020-08-01 HISTORY — DX: Personal history of antineoplastic chemotherapy: Z92.21

## 2020-08-01 IMAGING — MG DIGITAL DIAGNOSTIC BILAT W/ TOMO W/ CAD
6 of 9 series · 6 of 25 positions shown · non-contrast
Comparison: Previous exam(s).

CLINICAL DATA: RIGHT lumpectomy with radiation in [DATE].

EXAM:
DIGITAL DIAGNOSTIC BILATERAL MAMMOGRAM WITH TOMO AND CAD

[R MLO]
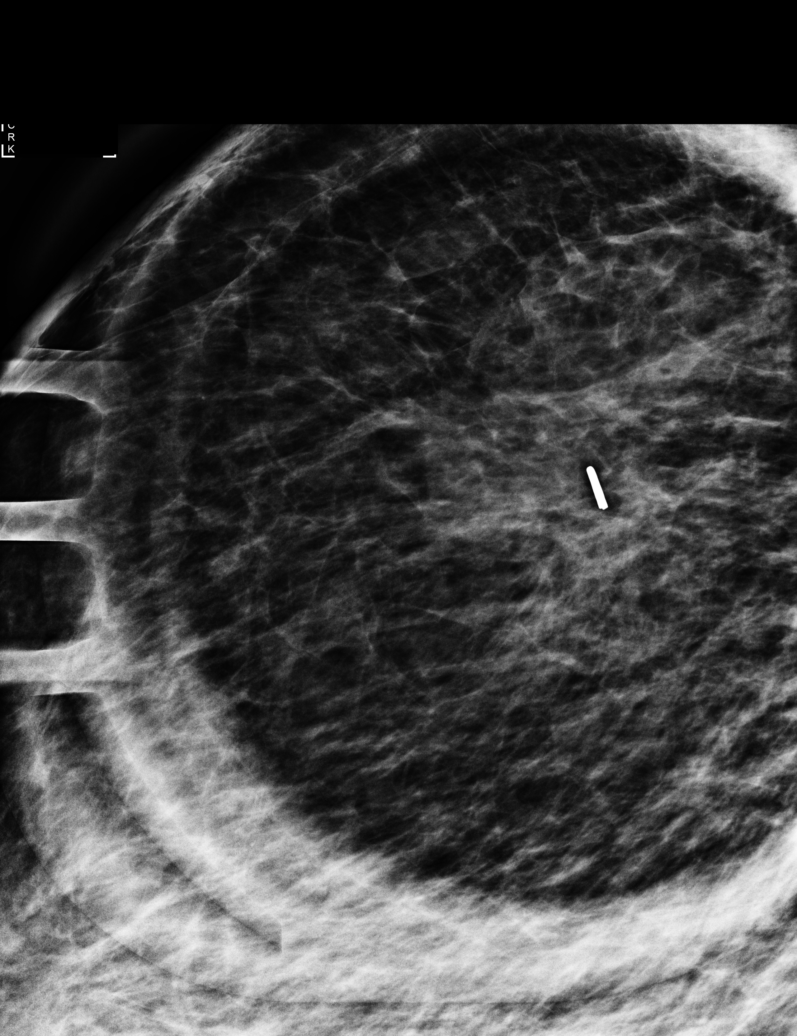

[L MLO synth-2D]
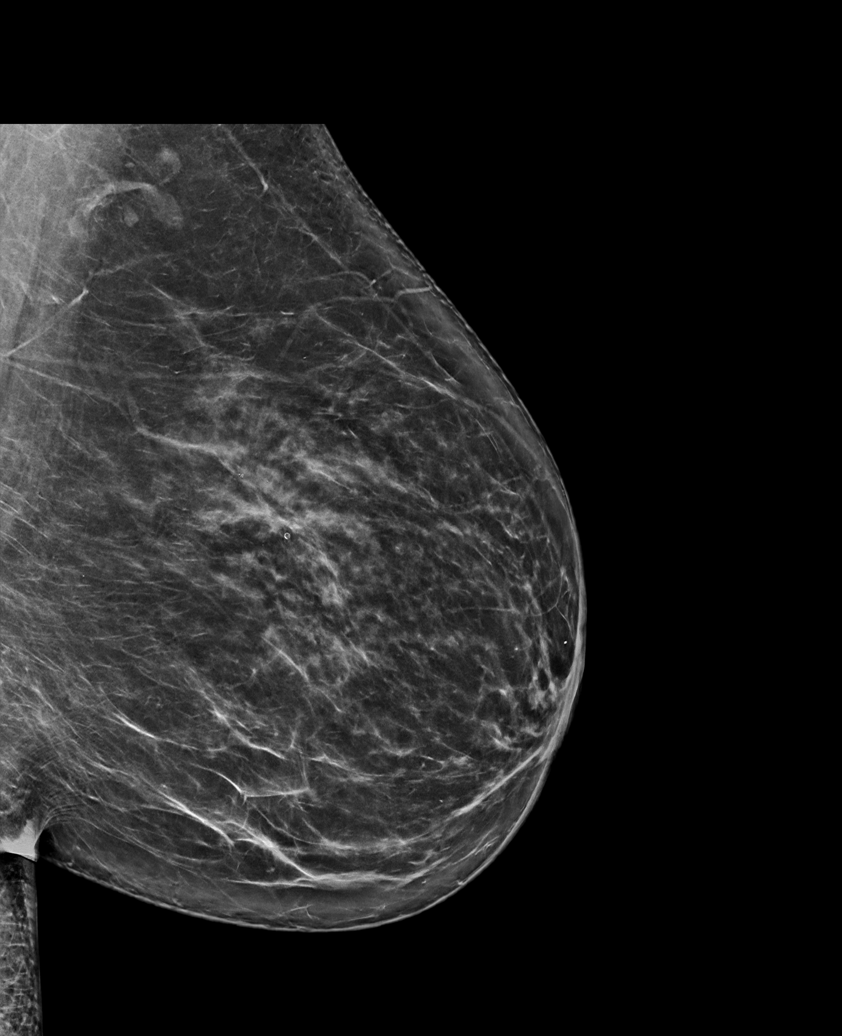

[R CC synth-2D]
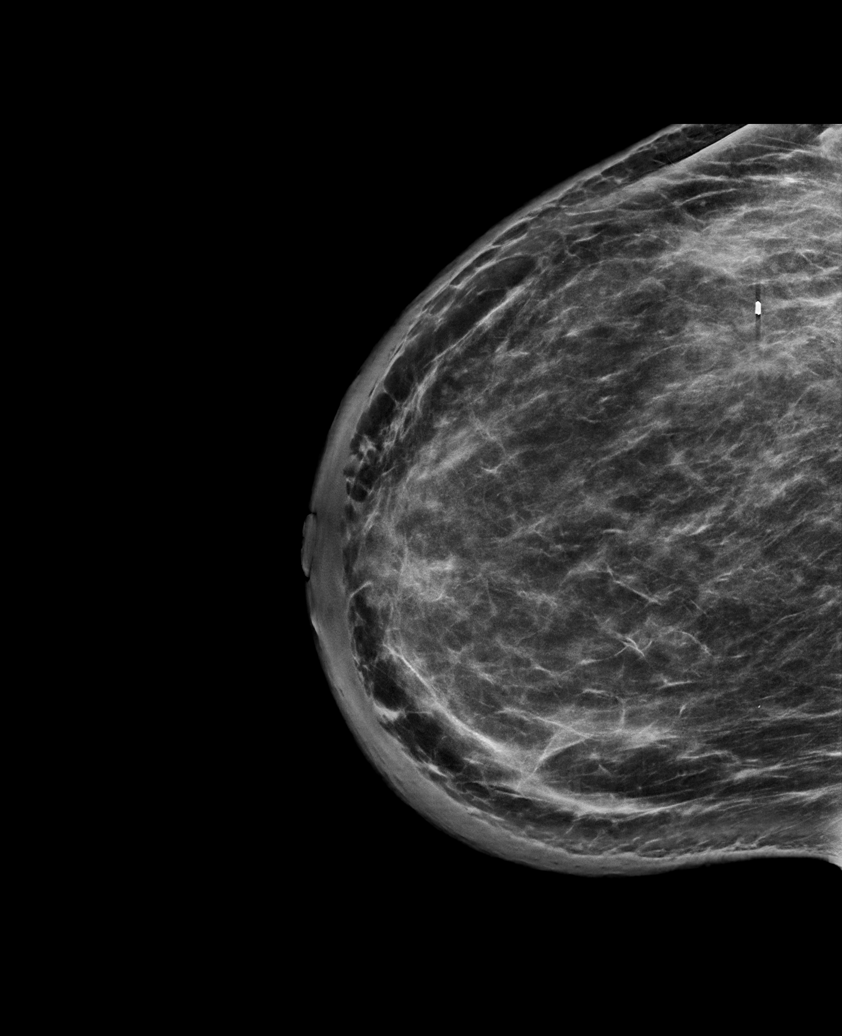

[L CC synth-2D]
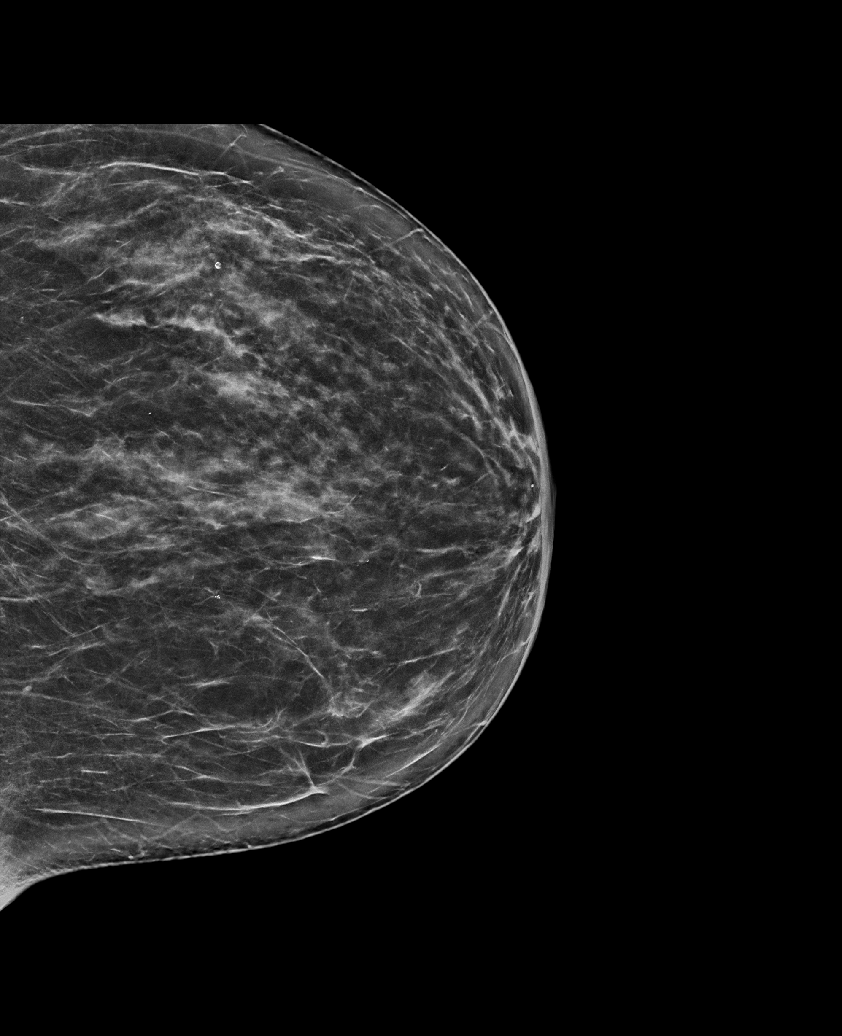

[R MLO synth-2D]
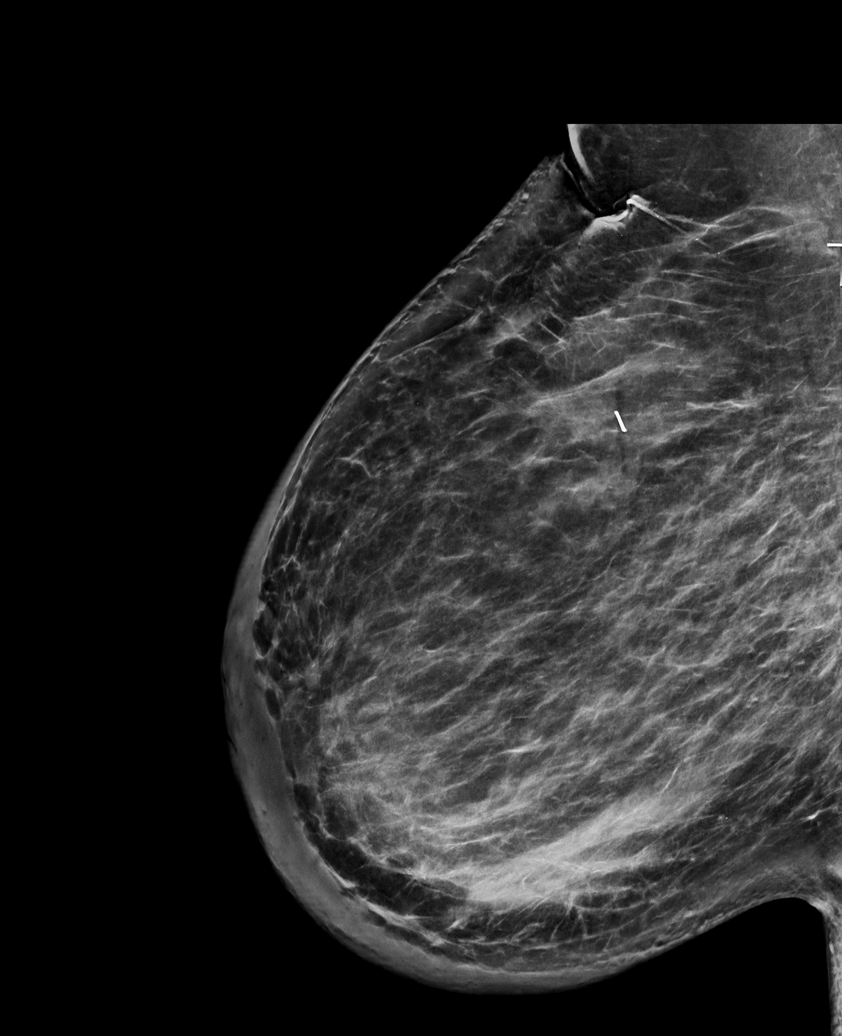

[L MLO tomo · tomo slice 43/85.0]
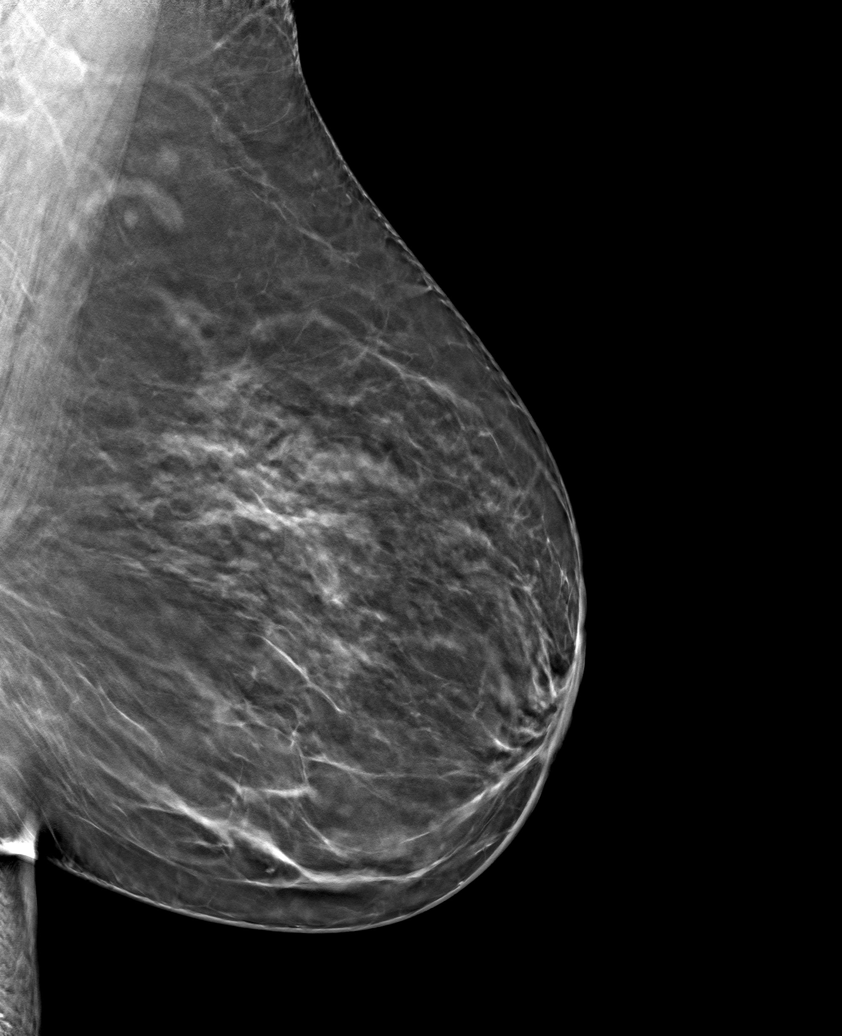

[6 of 25 positions shown; findings below may reference images not displayed]

ACR Breast Density Category c: The breast tissue is heterogeneously
dense, which may obscure small masses.
FINDINGS: Post operative and post treatment changes are seen in the
RIGHTbreast. No suspicious mass, distortion, or microcalcifications
are identified to suggest presence of malignancy.

Mammographic images were processed with CAD.
IMPRESSION: No mammographic evidence for malignancy.

RECOMMENDATION:
Diagnostic mammogram is suggested in 1 year. (Code:[VI])

I have discussed the findings and recommendations with the patient.
If applicable, a reminder letter will be sent to the patient
regarding the next appointment.

BI-RADS CATEGORY  2: Benign.

## 2020-08-04 NOTE — Progress Notes (Signed)
[facility]  [phone] [phone]     ID: Veronica [name] DOB: 08/14/1971  MR#: [id number]  UXN#:[id number]  Patient Care Team: Jamie Kato as PCP - General (Family Medicine) Mauro Kaufmann, RN as Oncology Nurse Navigator [name], RN as Oncology Nurse Navigator Erroll Luna, MD as Consulting Physician (General Surgery) Amonie Wisser, Virgie Dad, MD as Consulting Physician (Oncology) Kyung Rudd, MD as Consulting Physician (Radiation Oncology) Gaye Pollack, MD as Consulting Physician (Cardiothoracic Surgery) Chauncey Cruel, MD OTHER MD:  CHIEF COMPLAINT: Estrogen receptor positive breast cancer  CURRENT TREATMENT:  anastrozole   INTERVAL HISTORY: Veronica [name] returns today for follow-up of her estrogen receptor positive breast cancer.   She started anastrozole May 2021.  Hot flashes are mild during the day but a real problem at night when they keep her up.  Vaginal dryness has not been an issue.  Since her last visit, she underwent bilateral diagnostic mammography with tomography at [facility] on 08/01/2020 showing: breast density category C; no evidence of malignancy in either breast.  She also underwent bone density screening the same day showing a T-score of +0.1, which is normal.  She experienced a fall with head trauma on 07/16/2020. She proceeded to head CT that day showing no abnormalities.   REVIEW OF SYSTEMS: Audianna generally feels well.  She says the fall was due to tripping over her phone line.  She hit her right temple on a cabinet.  She did not lose consciousness.  She and her family have not been vaccinated and are not planning to against the COVID-19 virus.  She exercises by walking.  She denies unusual shortness of breath cough phlegm production or pleurisy.  She is not aware of any upper extremity lymphedema issues.  A detailed review of systems today was otherwise stable.   HISTORY OF CURRENT ILLNESS: From  the original intake note:  Veronica [name] had routine screening mammography on 04/27/2019 showing a possible abnormality in the right breast. She underwent right diagnostic mammography with tomography and right breast ultrasonography at [facility] on 05/01/2019 showing: breast density category C; highly suspicious 1.7 cm irregular mass in the right breast at 10 o'clock, 8 cm from the nipple; indeterminate hypoechoic round mass in the right axillary tail/low axilla. Physical exam showed no suspicious lumps.  Accordingly on 05/02/2019 she proceeded to biopsy of the right breast area in question. The pathology from this procedure ([id number]) showed: invasive ductal carcinoma, grade 3; ductal carcinoma in situ; lymphovascular invasion present. Prognostic indicators significant for: estrogen receptor, 30% positive with moderate staining intensity, and progesterone receptor, 30% positive with strong staining intensity. Proliferation marker Ki67 at 20%. HER2 negative by immunohistochemistry (1+).  The second "satellite" lesion was a lymph node which was also positive.  The patient's subsequent history is as detailed below.   PAST MEDICAL HISTORY: Past Medical History:  Diagnosis Date   Cancer ([place])    Chronic low back pain with left-sided sciatica    Family history of leukemia    Family history of stomach cancer    Hypertension    Personal history of chemotherapy    Personal history of radiation therapy    finished june '21    PAST SURGICAL HISTORY: Past Surgical History:  Procedure Laterality Date   APPLICATION OF WOUND VAC N/A 10/16/2019   Procedure: APPLICATION OF WOUND VAC;  Surgeon: Gaye Pollack, MD;  Location: [facility];  Service: Vascular;  Laterality: N/A;  BREAST BIOPSY Right 05/02/2019   right clips X2   BREAST LUMPECTOMY Right    06/2019   BREAST LUMPECTOMY WITH RADIOACTIVE SEED AND SENTINEL LYMPH NODE BIOPSY Right 06/12/2019   Procedure: RIGHT BREAST RADIOACTIVE  SEED LUMPECTOMY  AND RIGHT AXILLARY SEED TARGETED LYMPH NODE AND AXILLARY SENTINEL LYMPH NODE MAPPING;  Surgeon: Erroll Luna, MD;  Location: [facility];  Service: General;  Laterality: Right;  PEC BLOCK   C/S x2  '98 '03   CENTRAL VENOUS CATHETER INSERTION  10/02/2019   Procedure: Insertion Central Line Adult;  Surgeon: Serafina Mitchell, MD;  Location: [facility];  Service: Vascular;;   CESAREAN SECTION     CHOLECYSTECTOMY     I & D EXTREMITY N/A 10/16/2019   Procedure: DEBRIDEMENT of DEHISCED MIDLINE SURGICAL INCISION;  Surgeon: Gaye Pollack, MD;  Location: [facility];  Service: Vascular;  Laterality: N/A;   LAPAROSCOPIC ASSISTED VAGINAL HYSTERECTOMY  05/17/2011   Procedure: LAPAROSCOPIC ASSISTED VAGINAL HYSTERECTOMY;  Surgeon: Sharene Butters;  Location: [facility];  Service: Gynecology;  Laterality: N/A;  Laparoscopic Assisted Vaginal Hysterectomy With Lysis Of Adhesions   PERICARDIAL WINDOW N/A 10/02/2019   Procedure: Pericardial Window;  Surgeon: Serafina Mitchell, MD;  Location: [facility];  Service: Vascular;  Laterality: N/A;   PORT-A-CATH REMOVAL  10/02/2019   Procedure: Removal Port-A-Cath;  Surgeon: Serafina Mitchell, MD;  Location: [facility] [place];  Service: Vascular;;   PORTACATH PLACEMENT Right 06/12/2019   Procedure: INSERTION PORT-A-CATH WITH ULTRASOUND;  Surgeon: Erroll Luna, MD;  Location: [facility];  Service: General;  Laterality: Right;   RE-EXCISION OF BREAST LUMPECTOMY Right 07/17/2019   Procedure: RE-EXCISION OF RIGHT BREAST LUMPECTOMY;  Surgeon: Erroll Luna, MD;  Location: [facility];  Service: General;  Laterality: Right;   TUBAL LIGATION     ULTRASOUND GUIDANCE FOR VASCULAR ACCESS  10/02/2019   Procedure: Ultrasound Guidance For Vascular Access;  Surgeon: Serafina Mitchell, MD;  Location: [facility];  Service: Vascular;;   VENOGRAM N/A 10/02/2019   Procedure: Central VENOGRAM;  Surgeon: Serafina Mitchell, MD;  Location: [facility];  Service:  Vascular;  Laterality: N/A;    FAMILY HISTORY: Family History  Problem Relation Age of Onset   Arthritis Mother    COPD Mother    Hypertension Mother    Hyperlipidemia Mother    Leukemia Brother 4   Stomach cancer Maternal Grandmother        late 30s   Patient's parents are living (as of September 2020). Her father is 54 and her mother is 55 as of 04/2019. The patient denies a family hx of breast or ovarian cancer. She has 7 siblings, 6 brothers and 1 sister. She reports her maternal grandmother was diagnosed with stomach cancer at age 43. Her brother was diagnosed with leukemia at age 45.   GYNECOLOGIC HISTORY:  No LMP recorded. Patient has had a hysterectomy. Menarche: 49 years old Age at first live birth: 49 years old GX P 2 (+1 stillborn) LMP age 31-42 Contraceptive: unsure, maybe for a year at age 18. HRT no Hysterectomy? Yes, 05/2011 BSO? 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 She is married. Husband Sheppard Plumber") is a Hydrologist for a telephone company. She lives at home with her husband and two daughters. Daughter Suszanne Conners, age 39, is a Scientist, water quality. Daughter Jarrett Soho, age 80, is a Ship broker.     ADVANCED DIRECTIVES: Husband Heath Lark is her HCPOA.  HEALTH MAINTENANCE: Social History   Tobacco Use   Smoking status: Never Smoker   Smokeless tobacco: Never Used  Vaping Use   Vaping Use: Never used  Substance Use Topics   Alcohol use: Yes    Alcohol/week: 2.0 standard drinks    Types: 2 Standard drinks or equivalent per week    Comment: "2-3 a week"   Drug use: No     Colonoscopy: n/a  PAP: 02/2018  Bone density: n/a   No Known Allergies  Current Outpatient Medications  Medication Sig Dispense Refill   anastrozole (ARIMIDEX) 1 MG tablet Take 1 tablet (1 mg total) by mouth daily. 90 tablet 4   Cholecalciferol (VITAMIN D3) 25 MCG (1000 UT) CHEW Chew 1 tablet by mouth daily.      gabapentin  (NEURONTIN) 300 MG capsule Take 1 capsule (300 mg total) by mouth at bedtime. 90 capsule 4   metoprolol tartrate (LOPRESSOR) 25 MG tablet Take 1 tablet (25 mg total) by mouth 2 (two) times daily. 60 tablet 0   rivaroxaban (XARELTO) 20 MG TABS tablet Take 1 tablet (20 mg total) by mouth daily with supper. 90 tablet 3   No current facility-administered medications for this visit.    OBJECTIVE: white woman who appears stated age  18:   08/05/20 1128  BP: (!) 150/76  Pulse: 65  Resp: 18  Temp: 97.6 F (36.4 C)  SpO2: 100%   Wt Readings from Last 3 Encounters:  08/05/20 214 lb 4.8 oz (97.2 kg)  06/23/20 211 lb 12.8 oz (96.1 kg)  04/24/20 206 lb (93.4 kg)   Body mass index is 40.49 kg/m.    ECOG FS:1 - Symptomatic but completely ambulatory  Hair has come in salt-and-pepper and very curly Ocular: Sclerae unicteric, pupils round and equal Ear-nose-throat: Wearing a mask Lymphatic: No cervical or supraclavicular adenopathy Lungs no rales or rhonchi Heart regular rate and rhythm Abd soft, nontender, positive bowel sounds MSK no focal spinal tenderness, no joint edema Neuro: non-focal, well-oriented, appropriate affect Breasts: The right breast is status post lumpectomy and radiation.  There is the expected skin coarsening but no erythema and no evidence of local recurrence or residual disease.  The left breast is benign.  Both axillae are benign.  LAB RESULTS:  CMP     Component Value Date/Time   NA 138 08/05/2020 1045   K 4.4 08/05/2020 1045   CL 106 08/05/2020 1045   CO2 25 08/05/2020 1045   GLUCOSE 118 (H) 08/05/2020 1045   BUN 14 08/05/2020 1045   CREATININE 0.79 08/05/2020 1045   CREATININE 0.75 04/24/2020 1035   CALCIUM 9.9 08/05/2020 1045   PROT 6.8 08/05/2020 1045   ALBUMIN 3.8 08/05/2020 1045   AST 31 08/05/2020 1045   AST 23 04/24/2020 1035   ALT 37 08/05/2020 1045   ALT 20 04/24/2020 1035   ALKPHOS 101 08/05/2020 1045   BILITOT 1.2 08/05/2020 1045    BILITOT 1.1 04/24/2020 1035   GFRNONAA >60 08/05/2020 1045   GFRNONAA >60 04/24/2020 1035   GFRAA >60 04/24/2020 1035    No results found for: TOTALPROTELP, ALBUMINELP, A1GS, A2GS, BETS, BETA2SER, GAMS, MSPIKE, SPEI  No results found for: KPAFRELGTCHN, LAMBDASER, KAPLAMBRATIO  Lab Results  Component Value Date   WBC 5.8 08/05/2020   NEUTROABS 4.2 08/05/2020   HGB 15.3 (H) 08/05/2020   HCT 44.9 08/05/2020   MCV 87.0 08/05/2020   PLT 184 08/05/2020   No results found for: LABCA2  No components found for:  XAJOIN867  No results for input(s): INR in the last 168 hours.  No results found for: LABCA2  No results found for: EHM094  No results found for: BSJ628  No results found for: ZMO294  No results found for: CA2729  No components found for: HGQUANT  No results found for: CEA1 / No results found for: CEA1   No results found for: AFPTUMOR  No results found for: CHROMOGRNA  No results found for: HGBA, HGBA2QUANT, HGBFQUANT, HGBSQUAN (Hemoglobinopathy evaluation)   Lab Results  Component Value Date   LDH 170 09/30/2019    No results found for: IRON, TIBC, IRONPCTSAT (Iron and TIBC)  Lab Results  Component Value Date   FERRITIN 498 (H) 10/19/2019    Urinalysis    Component Value Date/Time   COLORURINE YELLOW 08/28/2019 2326   APPEARANCEUR CLOUDY (A) 08/28/2019 2326   LABSPEC 1.013 08/28/2019 2326   PHURINE 5.0 08/28/2019 2326   GLUCOSEU NEGATIVE 08/28/2019 2326   HGBUR MODERATE (A) 08/28/2019 2326   BILIRUBINUR NEGATIVE 08/28/2019 2326   KETONESUR NEGATIVE 08/28/2019 2326   PROTEINUR NEGATIVE 08/28/2019 2326   NITRITE NEGATIVE 08/28/2019 2326   LEUKOCYTESUR MODERATE (A) 08/28/2019 2326    STUDIES: CT Head Wo Contrast  Result Date: 07/16/2020 CLINICAL DATA:  Fall today with head trauma. Dizziness. History of breast cancer. EXAM: CT HEAD WITHOUT CONTRAST TECHNIQUE: Contiguous axial images were obtained from the base of the skull through the vertex  without intravenous contrast. COMPARISON:  09/28/2019 FINDINGS: Brain: The brain shows a normal appearance without evidence of malformation, atrophy, old or acute small or large vessel infarction, mass lesion, hemorrhage, hydrocephalus or extra-axial collection. Vascular: No hyperdense vessel. No evidence of atherosclerotic calcification. Skull: Normal.  No traumatic finding.  No focal bone lesion. Sinuses/Orbits: Sinuses are clear. Orbits appear normal. Mastoids are clear. Other: None significant IMPRESSION: Normal head CT. Electronically Signed   By: Nelson Chimes M.D.   On: 07/16/2020 16:13   DG Bone Density  Result Date: 08/01/2020 EXAM: DUAL X-RAY ABSORPTIOMETRY (DXA) FOR BONE MINERAL DENSITY IMPRESSION: Referring Physician:  Chauncey Cruel Your patient completed a BMD test using Lunar IDXA DXA system ( analysis version: 16 ) manufactured by [employer]. Technologist: AD PATIENT: Name: Veronica [name], Veronica [name] Patient ID:  [id number] Birth Date: 1971/10/08 Height:     61.0 in. Sex: Female Measured: 08/01/2020 Weight: 210.0 lbs. Indications: Anastrazole, Breast Cancer History, Caucasian, Chemo, Estrogen Deficient, Hysterectomy, Postmenopausal Fractures: None Treatments: Vitamin D (E933.5), Xarelto ASSESSMENT: The BMD measured at Femur Neck Right is 1.054 g/cm2 with a T-score of 0.1. This patient is considered NORMAL according to [facility]) criteria. The scan quality is good. L-4 was excluded due to degenerative changes. Site Region Measured Date Measured Age YA T-score BMD Significant CHANGE DualFemur Neck Right 08/01/2020    47.1         0.1     1.054 g/cm2 AP Spine  L1-L3      10/01/2020    47.1         1.7     1.371 g/cm2 DualFemur Total Mean 08/01/2020    47.1         0.9     1.118 g/cm2 World Health Organization [facility]) criteria for post-menopausal, Caucasian Women: Normal       T-score at or above -1 SD Osteopenia   T-score between -1 and -2.5 SD Osteoporosis T-score at or below -2.5 SD  RECOMMENDATION: 1. All patients should optimize calcium and vitamin D intake.  2. Consider FDA approved medical therapies in postmenopausal women and men aged 65 years and older, based on the following: a. A hip or vertebral (clinical or morphometric) fracture b. T-score < or = -2.5 at the femoral neck or spine after appropriate evaluation to exclude secondary causes c. Low bone mass (T-score between -1.0 and -2.5 at the femoral neck or spine) and a 10 year probability of a hip fracture > or = 3% or a 10 year probability of a major osteoporosis-related fracture > or = 20% based on the [place]-adapted WHO algorithm d. Clinician judgment and/or patient preferences may indicate treatment for people with 10-year fracture probabilities above or below these levels FOLLOW-UP: Patients with diagnosis of osteoporosis or at high risk for fracture should have regular bone mineral density tests. For patients eligible for Medicare, routine testing is allowed once every 2 years. The testing frequency can be increased to one year for patients who have rapidly progressing disease, those who are receiving or discontinuing medical therapy to restore bone mass, or have additional risk factors. I have reviewed this report and agree with the above findings. Mark A. Thornton Papas, M.D. [facility] Radiology Electronically Signed   By: Lavonia Dana M.D.   On: 08/01/2020 13:29   MM DIAG BREAST TOMO BILATERAL  Result Date: 08/01/2020 CLINICAL DATA:  RIGHT lumpectomy with radiation in July 2020. EXAM: DIGITAL DIAGNOSTIC BILATERAL MAMMOGRAM WITH TOMO AND CAD COMPARISON:  Previous exam(s). ACR Breast Density Category c: The breast tissue is heterogeneously dense, which may obscure small masses. FINDINGS: Post operative and post treatment changes are seen in the RIGHTbreast. No suspicious mass, distortion, or microcalcifications are identified to suggest presence of malignancy. Mammographic images were processed with CAD. IMPRESSION: No mammographic  evidence for malignancy. RECOMMENDATION: Diagnostic mammogram is suggested in 1 year. (Code:DM-B-01Y) I have discussed the findings and recommendations with the patient. If applicable, a reminder letter will be sent to the patient regarding the next appointment. BI-RADS CATEGORY  2: Benign. Electronically Signed   By: Nolon Nations M.D.   On: 08/01/2020 11:56     ELIGIBLE FOR AVAILABLE RESEARCH PROTOCOL: AET  ASSESSMENT: 49 y.o. Veronica [name], Veronica [name] woman status post right breast upper outer quadrant biopsy 05/01/2019 for a clinical T1c N1, stage IIA invasive ductal carcinoma, grade 3, estrogen and progesterone receptor positive, HER-2 nonamplified, with an MIB-1-1 of 20%.  (a) mass in the axillary tail was a positive lymph node  (1) MammaPrint obtained from the original biopsy shows a high risk luminal B tumor and predicts a 5-year disease-free survival of 91% in her case  (2) genetics testing 05/09/2019 through the Common Hereditary Gene Panel offered by [facility] found no deleterious mutations in APC, ATM, AXIN2, BARD1, BMPR1A, BRCA1, BRCA2, BRIP1, CDH1, CDK4, CDKN2A (p14ARF), CDKN2A (p16INK4a), CHEK2, CTNNA1, DICER1, EPCAM (Deletion/duplication testing only), GREM1 (promoter region deletion/duplication testing only), KIT, MEN1, MLH1, MSH2, MSH3, MSH6, MUTYH, NBN, NF1, NHTL1, PALB2, PDGFRA, PMS2, POLD1, POLE, PTEN, RAD50, RAD51C, RAD51D, RNF43, SDHB, SDHC, SDHD, SMAD4, SMARCA4. STK11, TP53, TSC1, TSC2, and VHL.  The following genes were evaluated for sequence changes only: SDHA and HOXB13 c.251G>A variant only.   (a) A variant of uncertain significance (VUS) was detected in one of her MSH6 genes (c.831A>C).  (3) status post right lumpectomy and sentinel lymph node sampling 06/12/2019 for a pT2 pN1, stage IIA invasive ductal carcinoma, grade 2, with positive margins  (a) a total of 4 sentinel lymph nodes removed, one positive (with ECE), ine itc  (b) margin clearance 04/19/2019 successful (  medial margin  close but negative for DCIS)  (4) adjuvant chemotherapy consisting of doxorubicin and cyclophosphamide in dose dense fashion x4 starting 07/10/2019, completed 09/24/2019; planned weekly paclitaxel x12 omitted   (a) echo 06/26/2019 shows an EF of 60-65%  (b echo on 09/19/2019 shows an EF of 60-65%  (c) chemotherapy stopped after four cycles of doxorubicin and cyclophosphamide due to #5 below  (5) Multiple VTE/PE documented 10/02/2019, on intravenous heparin initially, then Xarelto started 10/19/2019  (a) presented with SVC syndrome and bilateral pulmonary emboli on 09/28/2019  (b) s/p SVC thrombectomy and port removal 04/59/9774 complicated by hemopericardium and tamponade, necessitating pericardial window placement   (c) postop course complicated by wound dehiscence requiring wound VAC  (c) Doppler 10/03/2019 showed right lower extremity DVT  (d) CT angio and Doppler of both upper extremities 10/13/2019 found persistent acute bilateral pulmonary emboli, persistent but improved SVC thrombus, bilateral pleural effusions, and right lower lobe collapse. Bilateral upper extremity DVTs also noted  (e) Dopplers upper extremity 11/19/2019 showed clots cleared on the left, chronic DVT right internal jugular and axillary veins  (6) COVID-19 infection documented 09/27/2020, status post remdesivir  (7) adjuvant radiation 12/24/2019 - 02/06/2020  (8) anastrozole started mid May 2021  (a) [facility] and estradiol obtained 11/15/2019 consistent with menopause  (b) not a good candidate for tamoxifen given history of DVT/PE above  (c) bone density 07/31/2020 normal (T score equals 0.1).   PLAN:  Veronica [name] is now a little over a year out from definitive surgery for her breast cancer, with no evidence of disease recurrence.  This is very favorable.  She is tolerating anastrozole moderately well.  It is affecting her sleep because of hot flashes.  We find gabapentin can be very helpful in this setting.  She will take  300 mg at bedtime only.  We discussed the possible toxicities side effects and complications and she will let me know if she has any problems from it.  She is tolerating the rivaroxaban well and she will soon be a year into her anticoagulation.  It is always a question how long to do this and I would like to discuss that with Dr. Trula Slade as well.  My plan would be to discontinue in January understanding that this was a "provoked" clot since the patient had been on chemotherapy and had had recent surgery for breast cancer  She will have a virtual visit with me in January.  She knows to call for any other issue that may develop before then  Total encounter time 25 minutes.Veronica Jews C. Jakyle Petrucelli, MD Medical Oncology and Hematology [facility], [place] [address] Tel. [phone]    Fax. [phone]   I, Wilburn Mylar, am acting as scribe for Dr. Virgie Dad. Veronica [name].  I, Lurline Del MD, have reviewed the above documentation for accuracy and completeness, and I agree with the above.    *Total Encounter Time as defined by the Centers for Medicare and Medicaid Services includes, in addition to the face-to-face time of a patient visit (documented in the note above) non-face-to-face time: obtaining and reviewing outside history, ordering and reviewing medications, tests or procedures, care coordination (communications with other health care professionals or caregivers) and documentation in the medical record.

## 2020-08-05 ENCOUNTER — Inpatient Hospital Stay (HOSPITAL_BASED_OUTPATIENT_CLINIC_OR_DEPARTMENT_OTHER): Payer: 59 | Admitting: Oncology

## 2020-08-05 ENCOUNTER — Inpatient Hospital Stay: Payer: 59 | Attending: Oncology

## 2020-08-05 ENCOUNTER — Other Ambulatory Visit: Payer: Self-pay

## 2020-08-05 VITALS — BP 150/76 | HR 65 | Temp 97.6°F | Resp 18 | Ht 61.0 in | Wt 214.3 lb

## 2020-08-05 DIAGNOSIS — Z9181 History of falling: Secondary | ICD-10-CM | POA: Insufficient documentation

## 2020-08-05 DIAGNOSIS — C50411 Malignant neoplasm of upper-outer quadrant of right female breast: Secondary | ICD-10-CM

## 2020-08-05 DIAGNOSIS — N951 Menopausal and female climacteric states: Secondary | ICD-10-CM | POA: Insufficient documentation

## 2020-08-05 DIAGNOSIS — Z170 Estrogen receptor positive status [ER+]: Secondary | ICD-10-CM | POA: Insufficient documentation

## 2020-08-05 DIAGNOSIS — Z9221 Personal history of antineoplastic chemotherapy: Secondary | ICD-10-CM | POA: Insufficient documentation

## 2020-08-05 DIAGNOSIS — C773 Secondary and unspecified malignant neoplasm of axilla and upper limb lymph nodes: Secondary | ICD-10-CM | POA: Diagnosis not present

## 2020-08-05 DIAGNOSIS — Z8616 Personal history of COVID-19: Secondary | ICD-10-CM | POA: Insufficient documentation

## 2020-08-05 DIAGNOSIS — Z923 Personal history of irradiation: Secondary | ICD-10-CM | POA: Diagnosis not present

## 2020-08-05 DIAGNOSIS — I82210 Acute embolism and thrombosis of superior vena cava: Secondary | ICD-10-CM

## 2020-08-05 LAB — CBC WITH DIFFERENTIAL/PLATELET
Abs Immature Granulocytes: 0.04 10*3/uL (ref 0.00–0.07)
Basophils Absolute: 0 10*3/uL (ref 0.0–0.1)
Basophils Relative: 1 %
Eosinophils Absolute: 0.2 10*3/uL (ref 0.0–0.5)
Eosinophils Relative: 3 %
HCT: 44.9 % (ref 36.0–46.0)
Hemoglobin: 15.3 g/dL — ABNORMAL HIGH (ref 12.0–15.0)
Immature Granulocytes: 1 %
Lymphocytes Relative: 16 %
Lymphs Abs: 0.9 10*3/uL (ref 0.7–4.0)
MCH: 29.7 pg (ref 26.0–34.0)
MCHC: 34.1 g/dL (ref 30.0–36.0)
MCV: 87 fL (ref 80.0–100.0)
Monocytes Absolute: 0.4 10*3/uL (ref 0.1–1.0)
Monocytes Relative: 6 %
Neutro Abs: 4.2 10*3/uL (ref 1.7–7.7)
Neutrophils Relative %: 73 %
Platelets: 184 10*3/uL (ref 150–400)
RBC: 5.16 MIL/uL — ABNORMAL HIGH (ref 3.87–5.11)
RDW: 12.1 % (ref 11.5–15.5)
WBC: 5.8 10*3/uL (ref 4.0–10.5)
nRBC: 0 % (ref 0.0–0.2)

## 2020-08-05 LAB — COMPREHENSIVE METABOLIC PANEL
ALT: 37 U/L (ref 0–44)
AST: 31 U/L (ref 15–41)
Albumin: 3.8 g/dL (ref 3.5–5.0)
Alkaline Phosphatase: 101 U/L (ref 38–126)
Anion gap: 7 (ref 5–15)
BUN: 14 mg/dL (ref 6–20)
CO2: 25 mmol/L (ref 22–32)
Calcium: 9.9 mg/dL (ref 8.9–10.3)
Chloride: 106 mmol/L (ref 98–111)
Creatinine, Ser: 0.79 mg/dL (ref 0.44–1.00)
GFR, Estimated: >60 mL/min (ref >60)
Glucose, Bld: 118 mg/dL — ABNORMAL HIGH (ref 70–99)
Potassium: 4.4 mmol/L (ref 3.5–5.1)
Sodium: 138 mmol/L (ref 135–145)
Total Bilirubin: 1.2 mg/dL (ref 0.3–1.2)
Total Protein: 6.8 g/dL (ref 6.5–8.1)

## 2020-08-05 LAB — D-DIMER, QUANTITATIVE: D-Dimer, Quant: <0.27 ug/mL-FEU (ref 0.00–0.50)

## 2020-08-05 MED ORDER — GABAPENTIN 300 MG PO CAPS: 300.0000 mg | ORAL_CAPSULE | Freq: Every day | ORAL | 4 refills | Status: DC

## 2020-08-08 ENCOUNTER — Telehealth: Payer: Self-pay | Admitting: Oncology

## 2020-08-08 NOTE — Telephone Encounter (Signed)
Scheduled per 10/26 los. Called and spoke with pt, confirmed 1/19 appt

## 2020-09-09 ENCOUNTER — Telehealth: Payer: Self-pay

## 2020-09-09 NOTE — Telephone Encounter (Signed)
Pt called stating she is waiting to hear about appt for CTs. This LPN explained to pt that PA would first need to be obtained and the PA team would take care of this, then she will hear from radiology, but if she has not heard from them in the next couple of weeks, she should call them at [phone]. Pt verbalized understanding and agreement.

## 2020-09-11 ENCOUNTER — Other Ambulatory Visit: Payer: Self-pay | Admitting: Oncology

## 2020-10-07 ENCOUNTER — Encounter (HOSPITAL_COMMUNITY): Payer: Self-pay

## 2020-10-07 ENCOUNTER — Ambulatory Visit (HOSPITAL_COMMUNITY)
Admission: RE | Admit: 2020-10-07 | Discharge: 2020-10-07 | Disposition: A | Discharge status: Home / Self Care | Payer: 59 | Source: Ambulatory Visit | Attending: Oncology | Admitting: Oncology

## 2020-10-07 ENCOUNTER — Other Ambulatory Visit: Payer: Self-pay

## 2020-10-07 DIAGNOSIS — I82210 Acute embolism and thrombosis of superior vena cava: Secondary | ICD-10-CM | POA: Diagnosis present

## 2020-10-07 DIAGNOSIS — C50411 Malignant neoplasm of upper-outer quadrant of right female breast: Secondary | ICD-10-CM | POA: Insufficient documentation

## 2020-10-07 DIAGNOSIS — Z170 Estrogen receptor positive status [ER+]: Secondary | ICD-10-CM | POA: Diagnosis not present

## 2020-10-07 LAB — POCT I-STAT CREATININE: Creatinine, Ser: 0.8 mg/dL (ref 0.44–1.00)

## 2020-10-07 IMAGING — CT CT ANGIO CHEST
2 of 7 series · 17 of 46 positions shown · IV contrast (OMNIPAQUE)
Comparison: Chest CTA [DATE].

CLINICAL DATA: History of pulmonary embolism on anticoagulation.
Right breast cancer.

EXAM:
CT ANGIOGRAPHY CHEST WITH CONTRAST
TECHNIQUE: Multidetector CT imaging of the chest was performed using the
standard protocol during bolus administration of intravenous
contrast. Multiplanar CT image reconstructions and MIPs were
obtained to evaluate the vascular anatomy.
CONTRAST:  64mL OMNIPAQUE IOHEXOL 350 MG/ML SOLN

[Series 6: thins · axial · 0.71mm/px · z∈[-243,-8]mm · 15 of 267 slices shown]
[im 16/267  lung]
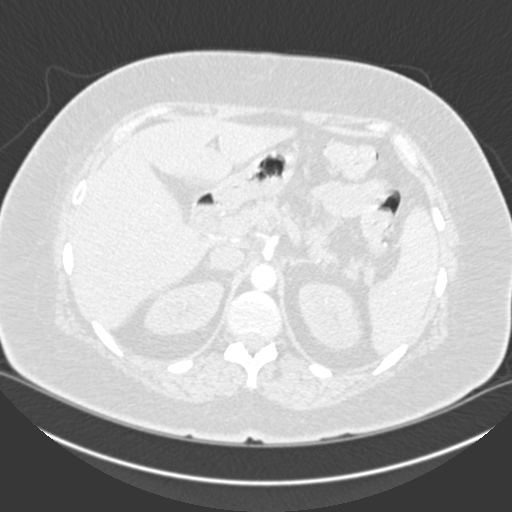
[im 32/267  soft-tissue]
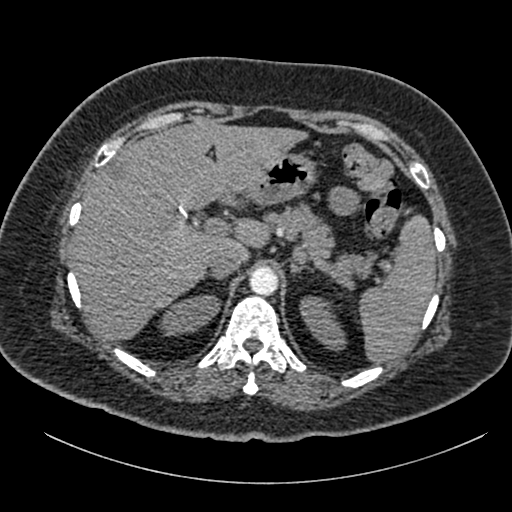
[im 47/267  lung]
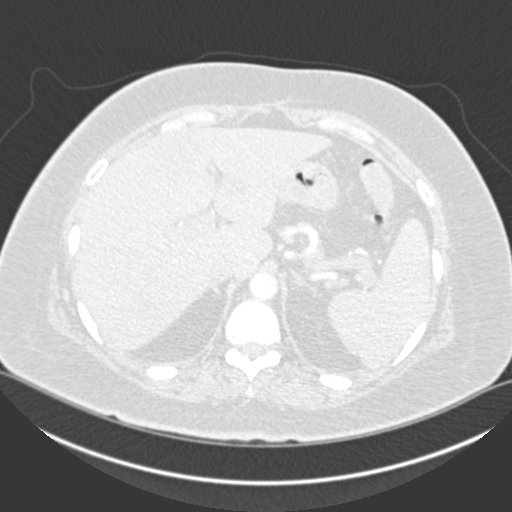
[im 63/267  soft-tissue]
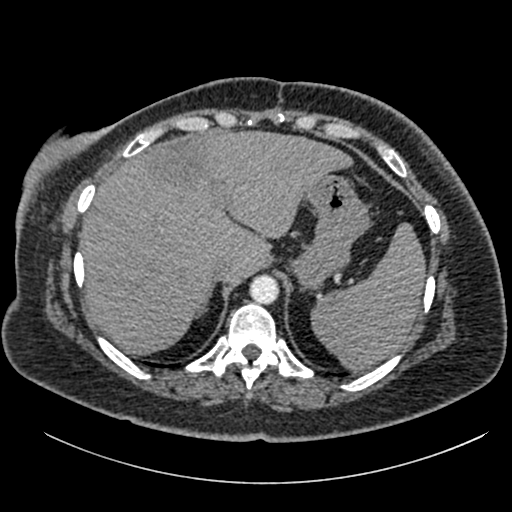
[im 79/267  lung]
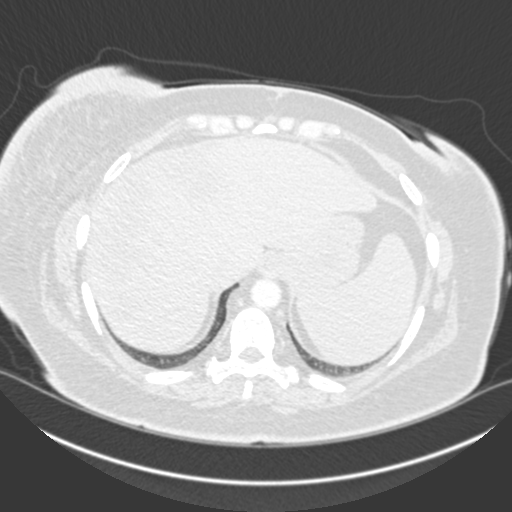
[im 94/267  soft-tissue]
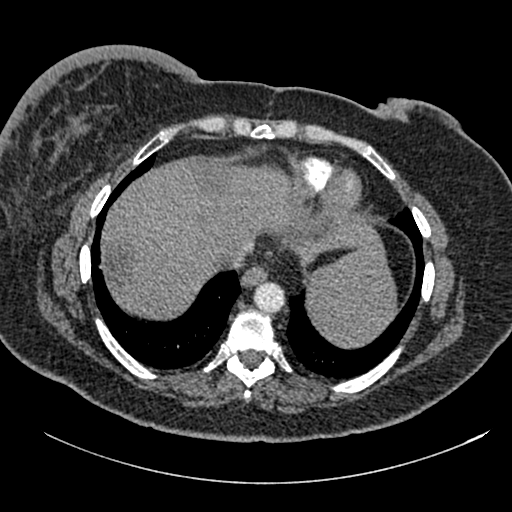
[im 110/267  lung]
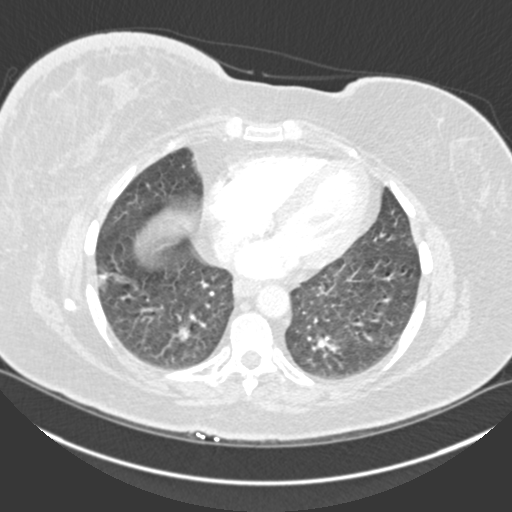
[im 141/267  soft-tissue]
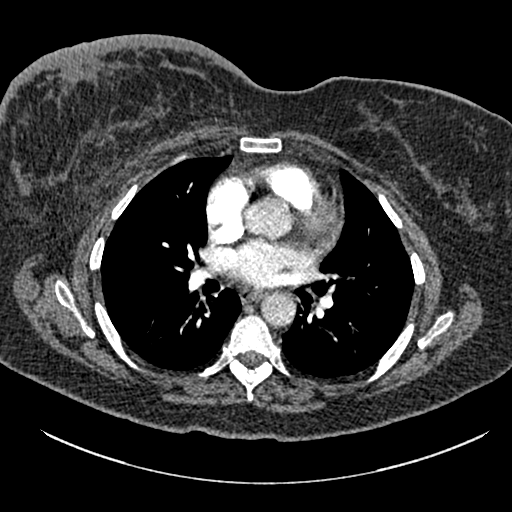
[im 157/267  lung]
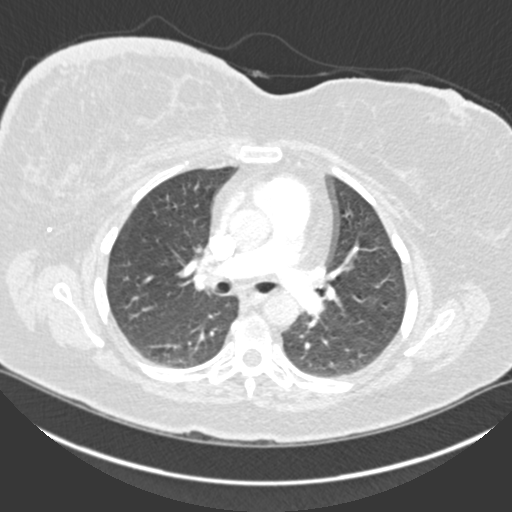
[im 173/267  soft-tissue]
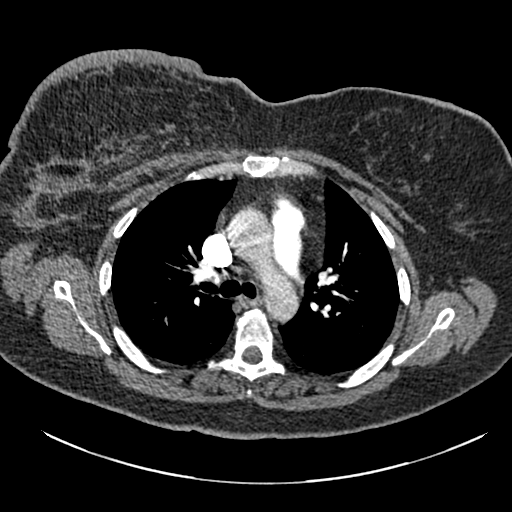
[im 188/267  lung]
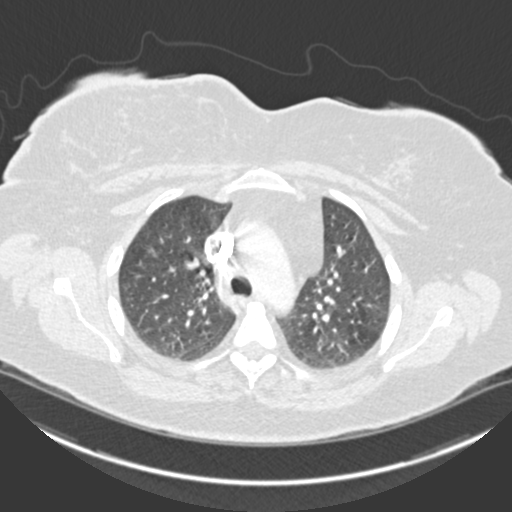
[im 204/267  soft-tissue]
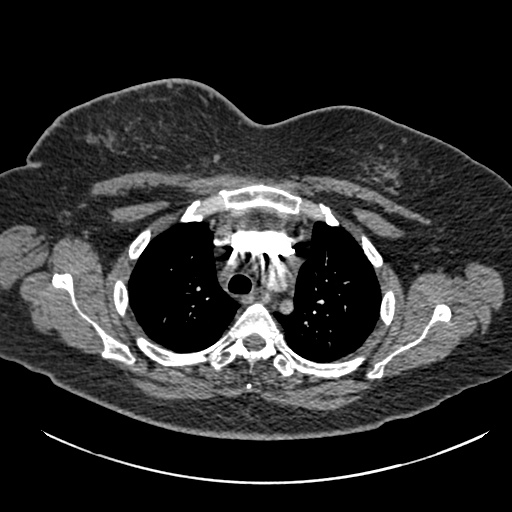
[im 220/267  lung]
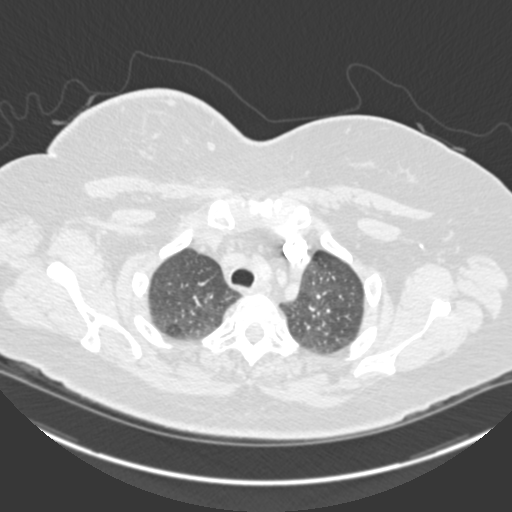
[im 235/267  soft-tissue]
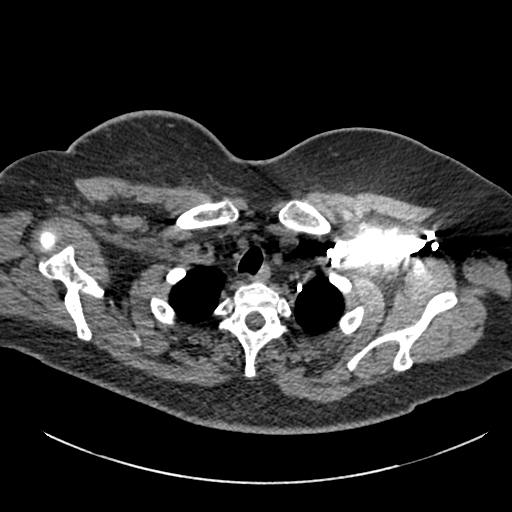
[im 251/267  lung]
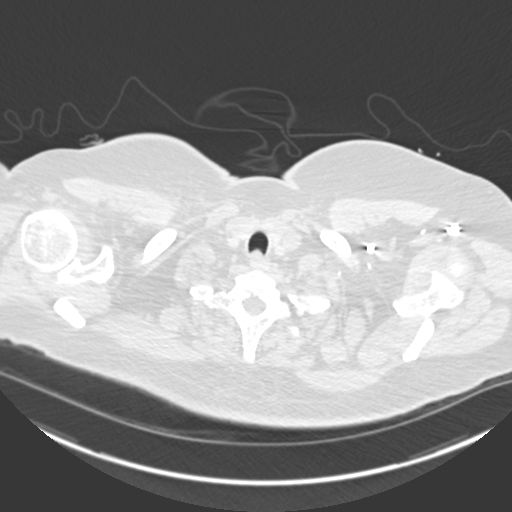

[Series 7: coronal mpr · coronal · 0.61mm/px · 2 of 72 slices shown]
[im 24/72  soft-tissue]
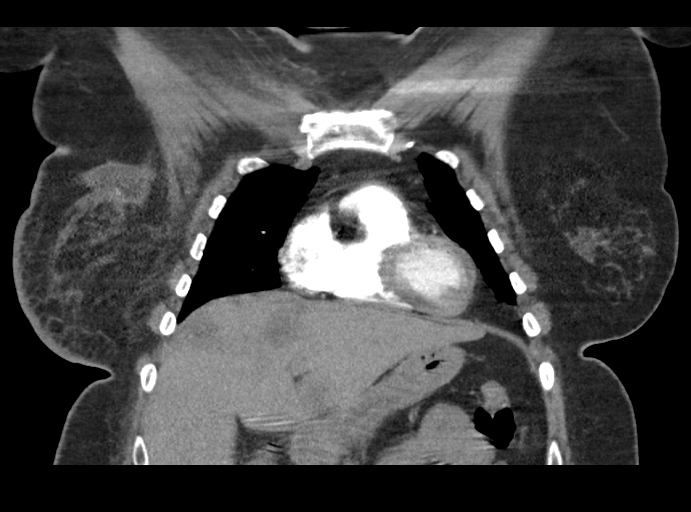
[im 48/72  soft-tissue]
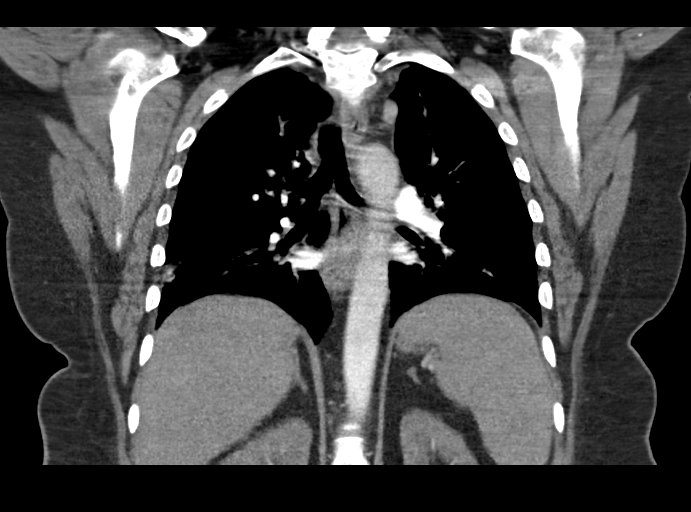

[17 of 46 positions shown; findings below may reference images not displayed]

FINDINGS: Cardiovascular: The pulmonary arteries are well opacified with
contrast to the level of the segmental branches. Peripheral
evaluation is mildly limited by breathing artifact. There is no
evidence of recurrent acute pulmonary embolism. No significant
systemic arterial abnormalities are identified. The heart size is
stable. There is no significant pericardial fluid.

Mediastinum/Nodes: There are no enlarged mediastinal, hilar,
axillary or internal mammary lymph nodes. Surgical clips are present
within the right axilla with decreased size of a presumed
postoperative fluid collection, now measuring 3.8 cm on image 33/5.
The thyroid gland, trachea and esophagus demonstrate no significant
findings.

Lungs/Pleura: Interval resolution of previously demonstrated
bilateral pleural effusions. The aeration of the lung bases has
improved with mild residual subsegmental atelectasis. There is
diffuse central airway thickening, but no suspicious pulmonary
nodularity.

Upper abdomen: There are multiple ill-defined low-density hepatic
lesions which were not visualized previously, highly worrisome for
multifocal metastatic disease. Representative lesions include a
cm lesion laterally in the dome of the right lobe on image 61/5, a
4.5 cm lesion in segment 4A (image 68/5) and a 2.1 cm lesion in
segment 3 (image 77/5). Previous cholecystectomy. No adnexal mass.

Musculoskeletal/Chest wall: Postsurgical changes in the right breast
with diffuse dermal thickening. As above, decreased size of presumed
postsurgical fluid collection laterally in the right breast. No
suspicious chest wall mass or suspicious osseous findings.

Review of the MIP images confirms the above findings.
IMPRESSION: 1. No evidence of recurrent acute pulmonary embolism or other acute
chest process.
2. Interval resolution of previously demonstrated bilateral pleural
effusions and bibasilar atelectasis.
3. Multiple ill-defined low-density hepatic lesions were not
visualized previously, highly worrisome for multifocal metastatic
disease. These could be further evaluated with abdominal MRI if
clinically warranted.
4. Postsurgical changes in the right breast and right axilla.
Decreased size of presumed postsurgical fluid collection laterally
in the right breast. No evidence of locally recurrent breast cancer
or thoracic metastatic disease.

## 2020-10-07 MED ORDER — IOHEXOL 350 MG/ML SOLN
100.0000 mL | Freq: Once | INTRAVENOUS | Status: AC | PRN
  Administered 2020-10-07: 12:00:00 64 mL via INTRAVENOUS

## 2020-10-07 NOTE — Progress Notes (Signed)
Bilateral upper extremity venous duplex has been completed. Preliminary results can be found in CV Proc through chart review.  Results were given to [facility] at Dr. Darrall Dears office.  Jody H RVT, RDMS 12:17 10/07/2020

## 2020-10-14 ENCOUNTER — Other Ambulatory Visit: Payer: Self-pay | Admitting: Oncology

## 2020-10-14 ENCOUNTER — Telehealth: Payer: Self-pay | Admitting: Oncology

## 2020-10-14 DIAGNOSIS — R160 Hepatomegaly, not elsewhere classified: Secondary | ICD-10-CM

## 2020-10-14 DIAGNOSIS — C50411 Malignant neoplasm of upper-outer quadrant of right female breast: Secondary | ICD-10-CM

## 2020-10-14 NOTE — Progress Notes (Unsigned)
I called Veronica [name] this morning and discussed the results of the CT angio with her.  As far as the clots are concerned the results are very favorable.  She only has about 3 more Xarelto pills left and she is going to just run through those and then stop.  Her last Xarelto dose will be 10/18/2019  The other news of course is that we are now seeing some spots in her liver.  I explained that we really do not know what these are but these may be her breast cancer.  I discussed getting a breast MRI.  She does not have a history of claustrophobia.  We will try to get that done ASAP and if this still looks suspicious for cancer we will try to get a biopsy before her appointment with me 10/30/2019.

## 2020-10-14 NOTE — Telephone Encounter (Signed)
Rescheduled appointment and added labs per 1/4 schedule message. Patient is aware of changes.

## 2020-10-22 ENCOUNTER — Other Ambulatory Visit: Payer: Self-pay

## 2020-10-22 ENCOUNTER — Ambulatory Visit (HOSPITAL_COMMUNITY)
Admission: RE | Admit: 2020-10-22 | Discharge: 2020-10-22 | Disposition: A | Discharge status: Home / Self Care | Payer: 59 | Source: Ambulatory Visit | Attending: Oncology | Admitting: Oncology

## 2020-10-22 DIAGNOSIS — C50411 Malignant neoplasm of upper-outer quadrant of right female breast: Secondary | ICD-10-CM | POA: Insufficient documentation

## 2020-10-22 DIAGNOSIS — Z170 Estrogen receptor positive status [ER+]: Secondary | ICD-10-CM | POA: Diagnosis present

## 2020-10-22 DIAGNOSIS — R160 Hepatomegaly, not elsewhere classified: Secondary | ICD-10-CM

## 2020-10-22 IMAGING — MR MR ABDOMEN WO/W CM
18 series · 48 of 48 positions shown · IV contrast (gadavist)
Comparison: CTA chest [DATE].

CLINICAL DATA: Hepatic lesions visualized on chest CT concerning
for metastatic disease in the setting of breast cancer.

EXAM:
MRI ABDOMEN WITHOUT AND WITH CONTRAST
TECHNIQUE: Multiplanar multisequence MR imaging of the abdomen was performed
both before and after the administration of intravenous contrast.
CONTRAST:  10mL GADAVIST GADOBUTROL 1 MMOL/ML IV SOLN

[Series 3: T2 · coronal · 6.0mm · 1.56mm/px · 2 of 34 slices shown (1 of 2)]
[im 1/34]
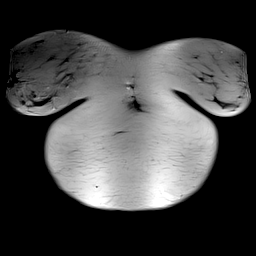
[im 34/34]
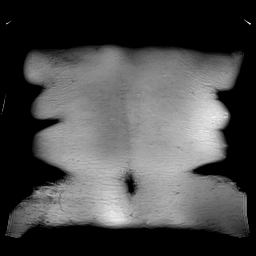

[Series 4: T2 fat-sat · axial · 6.0mm · 1.25mm/px · z∈[-134,+147]mm · 2 of 40 slices shown]
[im 1/40]
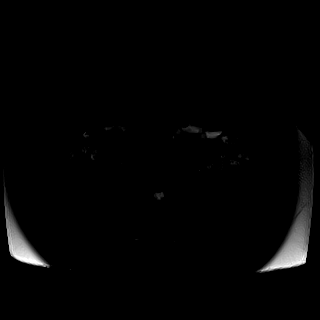
[im 40/40]
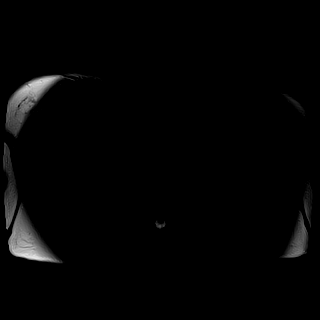

[Series 6: T1 · axial · 3.0mm · 1.25mm/px · z∈[-121,+140]mm · 3 of 88 slices shown (1 of 2)]
[im 1/88]
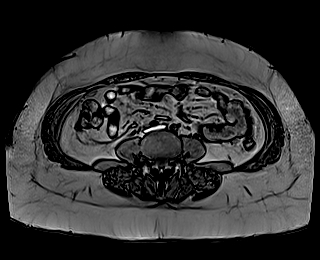
[im 44/88]
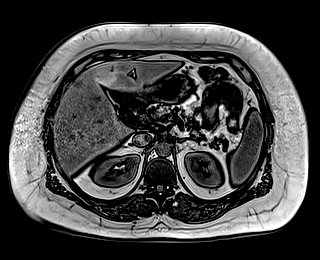
[im 88/88]
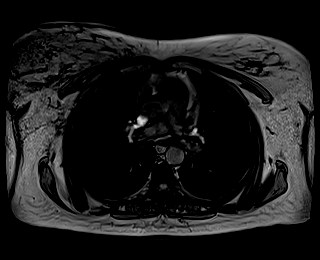

[Series 7: T1 · axial · 3.0mm · 1.25mm/px · z∈[-121,+140]mm · 3 of 88 slices shown (2 of 2)]
[im 1/88]
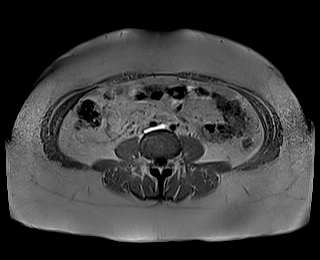
[im 44/88]
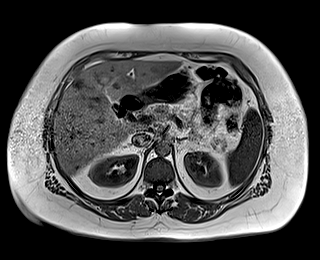
[im 88/88]
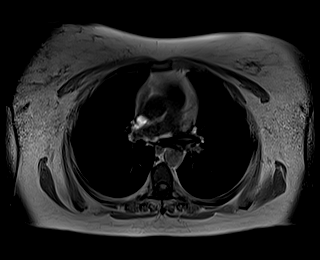

[Series 8: DWI · axial · 6.0mm · 1.49mm/px · z∈[-134,+147]mm · 3 of 80 slices shown (1 of 2)]
[im 1/80]
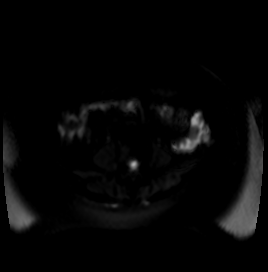
[im 40/80]
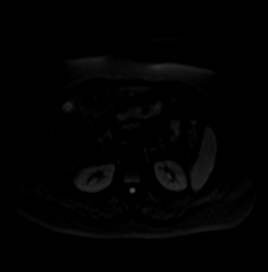
[im 80/80]
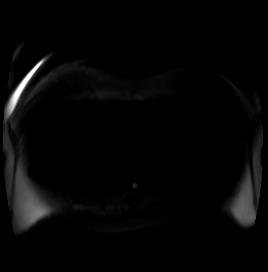

[Series 9: DWI · axial · 6.0mm · 1.49mm/px · z∈[-134,+147]mm · 2 of 40 slices shown (2 of 2)]
[im 1/40]
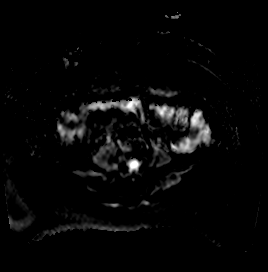
[im 40/40]
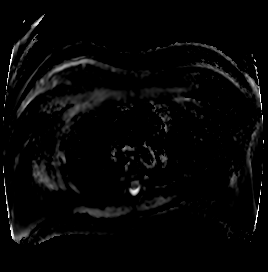

[Series 10: bSSFP · axial · 4.0mm · 0.84mm/px · z∈[-118,+138]mm · 3 of 65 slices shown]
[im 1/65]
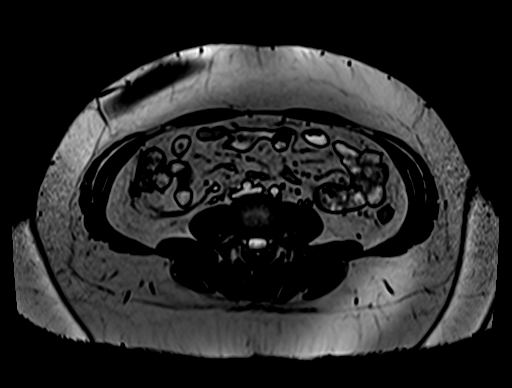
[im 33/65]
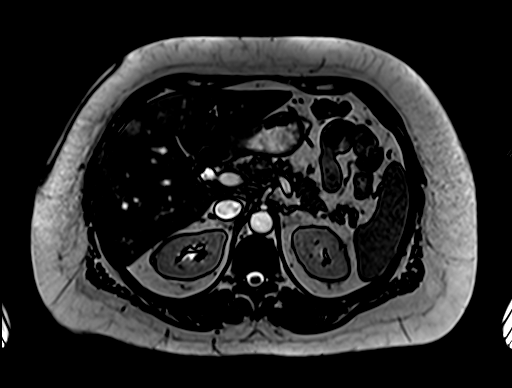
[im 65/65]
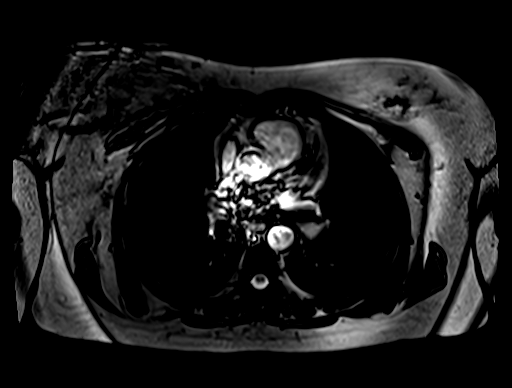

[Series 12: T1 dynamic · axial · 3.0mm · 1.25mm/px · z∈[-109,+128]mm · 3 of 80 slices shown (1 of 6)]
[im 1/80]
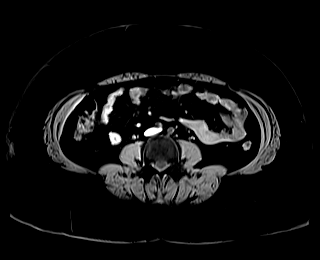
[im 40/80]
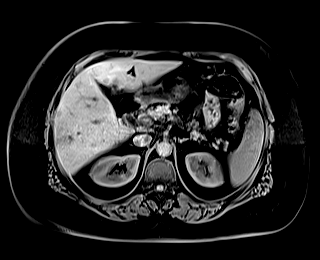
[im 80/80]
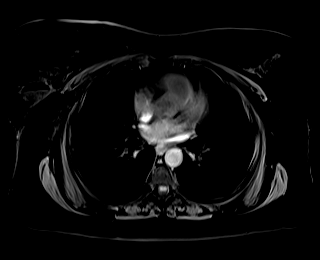

[Series 15: T1 dynamic · axial · 3.0mm · 1.25mm/px · z∈[-109,+128]mm · 3 of 80 slices shown (2 of 6)]
[im 1/80]
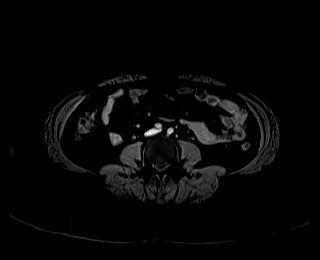
[im 40/80]
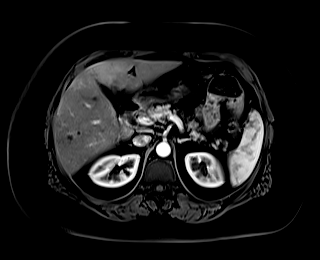
[im 80/80]
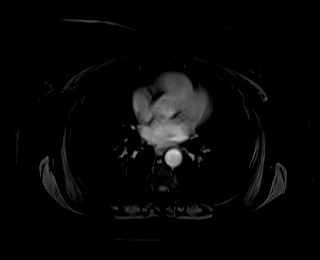

[Series 17: T1 dynamic · axial · 3.0mm · 1.25mm/px · z∈[-109,+128]mm · 3 of 80 slices shown (3 of 6)]
[im 1/80]
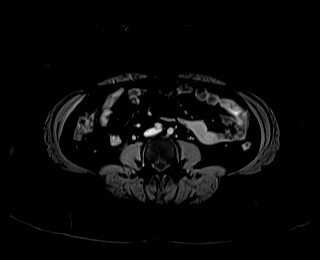
[im 40/80]
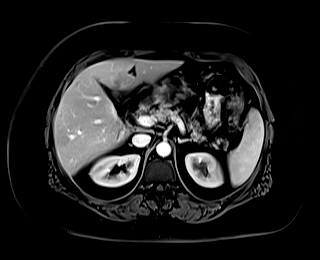
[im 80/80]
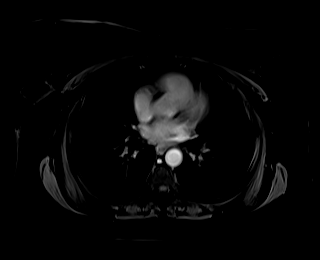

[Series 19: T1 dynamic · axial · 3.0mm · 1.25mm/px · z∈[-109,+128]mm · 3 of 80 slices shown (4 of 6)]
[im 1/80]
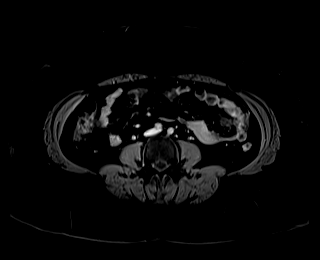
[im 40/80]
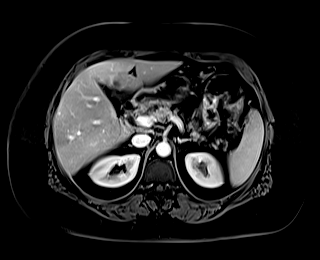
[im 80/80]
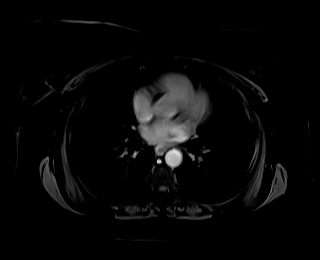

[Series 21: T1 dynamic · coronal · 5.0mm · 1.41mm/px · 2 of 52 slices shown (5 of 6)]
[im 1/52]
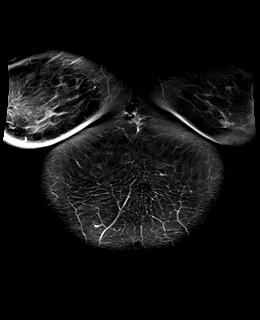
[im 52/52]
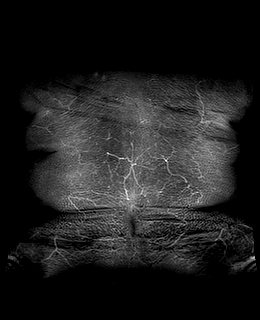

[Series 22: T2 · axial · 6.0mm · 1.56mm/px · 1 of 38 slices shown (2 of 2)]
[im 1/38]
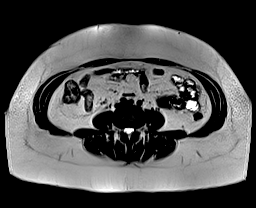

[Series 24: T1 dynamic · axial · 3.0mm · 1.25mm/px · z∈[-109,+128]mm · 3 of 80 slices shown (6 of 6)]
[im 1/80]
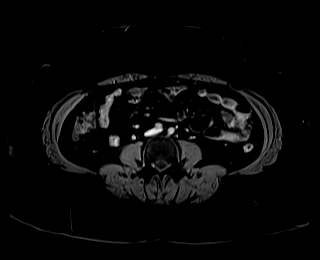
[im 40/80]
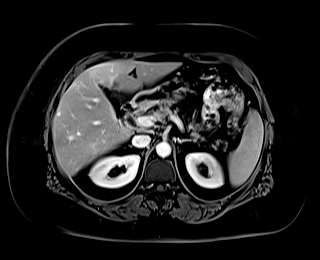
[im 80/80]
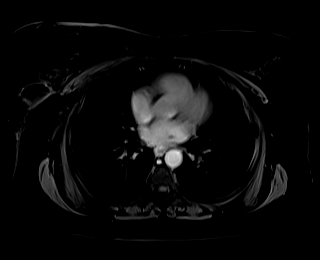

[Series 100: sub_arterial · axial · 3.0mm · 1.25mm/px · z∈[-109,+128]mm · 3 of 80 slices shown]
[im 1/80]
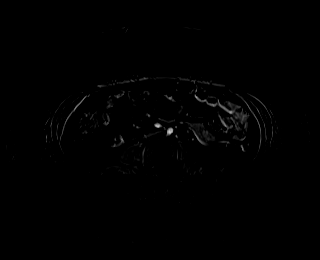
[im 40/80]
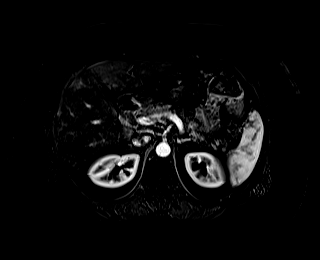
[im 80/80]
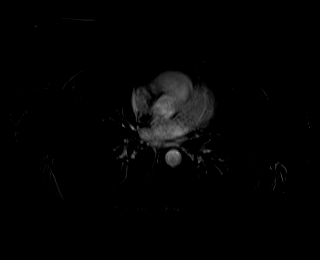

[Series 101: sub_(id) · axial · 3.0mm · 1.25mm/px · z∈[-109,+128]mm · 3 of 80 slices shown (1 of 2)]
[im 1/80]
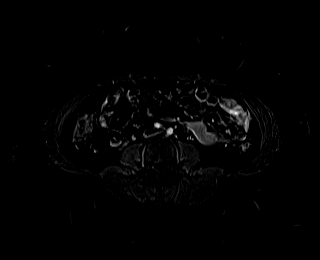
[im 40/80]
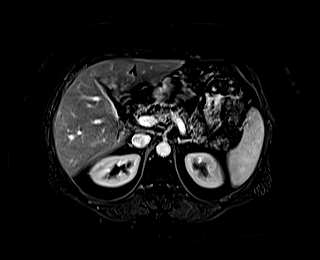
[im 80/80]
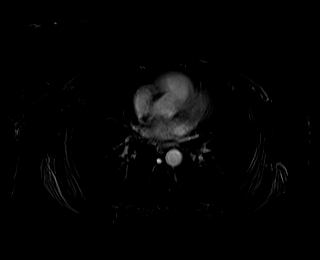

[Series 102: sub_(id) · axial · 3.0mm · 1.25mm/px · z∈[-109,+128]mm · 3 of 80 slices shown (2 of 2)]
[im 1/80]
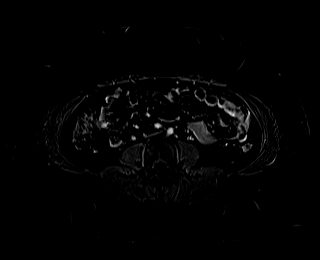
[im 40/80]
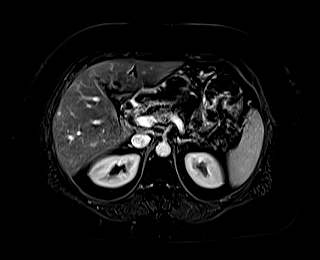
[im 80/80]
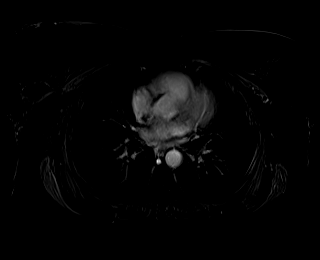

[Series 103: sub_delay · axial · 3.0mm · 1.25mm/px · z∈[-109,+128]mm · 3 of 80 slices shown]
[im 1/80]
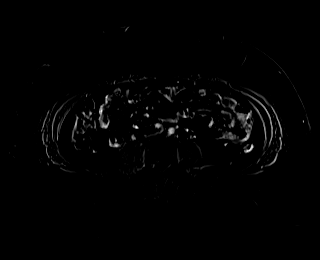
[im 40/80]
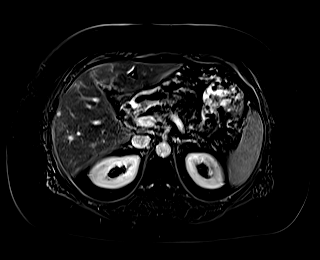
[im 80/80]
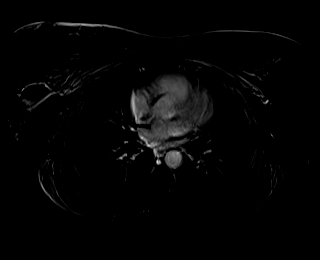

[48 of 48 positions shown; findings below may reference images not displayed]

FINDINGS: Lower chest: Similar scattered subsegmental atelectasis in the
bases.

Hepatobiliary: There are numerous bilobar peripherally enhancing
solid hepatic lesions. For reference:

-segment VII lesion which measures 2.0 cm (series 4, image 10)

-segment V/VIII 8 lesion which measures 2.5 cm (series 4, image 14).

-segment III lesion which measures 6 mm (series 4, image 20)

Pancreas: No mass, inflammatory changes, or other parenchymal
abnormality identified.

Spleen:  Within normal limits in size and appearance.

Adrenals/Urinary Tract: No masses identified. No evidence of
hydronephrosis.

Stomach/Bowel: Visualized portions within the abdomen are
unremarkable.

Vascular/Lymphatic: No pathologically enlarged lymph nodes
identified. No abdominal aortic aneurysm demonstrated.

Other:  No ascites.

Musculoskeletal: No suspicious bone lesions identified. Postsurgical
change in the right breast and axilla.
IMPRESSION: Numerous bilobar peripherally enhancing solid hepatic lesions,
consistent with metastatic disease.

## 2020-10-22 MED ORDER — GADOBUTROL 1 MMOL/ML IV SOLN
10.0000 mL | Freq: Once | INTRAVENOUS | Status: AC | PRN
  Administered 2020-10-22: 10 mL via INTRAVENOUS

## 2020-10-24 ENCOUNTER — Encounter (HOSPITAL_COMMUNITY): Payer: Self-pay | Admitting: Radiology

## 2020-10-24 ENCOUNTER — Other Ambulatory Visit: Payer: Self-pay | Admitting: Oncology

## 2020-10-24 DIAGNOSIS — Z170 Estrogen receptor positive status [ER+]: Secondary | ICD-10-CM

## 2020-10-24 NOTE — Progress Notes (Signed)
Veronica [name] Female, 50 y.o., [birth year]  MRN:  [id number] Phone:  [phone] Jerilynn Mages)       PCP:  Jamie Kato Coverage:  [facility] Other  Next Appt With Oncology 10/29/2020 at 3:30 PM           RE: [place] CORE BIOPSY (LIVER) Received: Today Magrinat, Virgie Dad, MD  Garth Bigness D  OK to hold as requested   GM        Previous Messages   ----- Message -----  From: Garth Bigness D  Sent: 10/24/2020 10:17 AM EST  To: Chauncey Cruel, MD  Subject: FW: [place] CORE BIOPSY (LIVER)            Good morning, patient is on Eliquis and will need to hold for 1 day prior to Biopsy. Please advise if okay to hold. Thanks Aniceto Boss  ----- Message -----  From: Garth Bigness D  Sent: 10/24/2020 10:16 AM EST  To: Ir Procedure Requests  Subject: [place] CORE BIOPSY (LIVER)              Procedure: [place] CORE BIOPSY (LIVER)   Reason:  Malignant neoplasm of upper-outer quadrant of right breast in female, estrogen receptor positive, evaluate masses seen on liver MRI, history breast cancer   History: MR, CT in computer   Provider: Chauncey Cruel   Provider Contact: [phone]

## 2020-10-24 NOTE — Progress Notes (Signed)
[name] Female, 50 y.o., 1971/04/20  MRN:  [id number] Phone:  [phone] Jerilynn Mages)       PCP:  Jamie Kato Coverage:  [facility] Other  Next Appt With Oncology 10/29/2020 at 3:30 PM           RE: [place] CORE BIOPSY (LIVER) Received: Today Suttle, Rosanne Ashing, MD  Garth Bigness D  Approved for US guided liver mass biopsy, segment IV involvement most likely best target.   Dylan        Previous Messages   ----- Message -----  From: Garth Bigness D  Sent: 10/24/2020 10:16 AM EST  To: Ir Procedure Requests  Subject: [place] CORE BIOPSY (LIVER)              Procedure: [place] CORE BIOPSY (LIVER)   Reason:  Malignant neoplasm of upper-outer quadrant of right breast in female, estrogen receptor positive, evaluate masses seen on liver MRI, history breast cancer   History: MR, CT in computer   Provider: Chauncey Cruel   Provider Contact: [phone]

## 2020-10-24 NOTE — Progress Notes (Signed)
I called Veronica [name] with the results of her MRI of the liver which are worrisome for metastatic disease.  We do not see cancer in the bones or lungs.  It is possible this is really unrelated.  She needs a liver biopsy.  She is agreeable to that.  Accordingly I have set her up for labs and liver biopsy and moved back the appointment with me to sometime later in the month.

## 2020-10-28 ENCOUNTER — Telehealth: Payer: Self-pay | Admitting: Oncology

## 2020-10-28 NOTE — Telephone Encounter (Signed)
Left message with rescheduled follow-up appointment per 1/14 schedule message. Gave option to call back to reschedule if needed.

## 2020-10-29 ENCOUNTER — Ambulatory Visit: Payer: 59 | Admitting: Oncology

## 2020-10-29 ENCOUNTER — Other Ambulatory Visit: Payer: Self-pay

## 2020-10-29 ENCOUNTER — Inpatient Hospital Stay: Payer: 59 | Attending: Oncology

## 2020-10-29 DIAGNOSIS — C50411 Malignant neoplasm of upper-outer quadrant of right female breast: Secondary | ICD-10-CM | POA: Insufficient documentation

## 2020-10-29 DIAGNOSIS — Z170 Estrogen receptor positive status [ER+]: Secondary | ICD-10-CM | POA: Diagnosis not present

## 2020-10-29 LAB — COMPREHENSIVE METABOLIC PANEL
ALT: 35 U/L (ref 0–44)
AST: 29 U/L (ref 15–41)
Albumin: 4.1 g/dL (ref 3.5–5.0)
Alkaline Phosphatase: 176 U/L — ABNORMAL HIGH (ref 38–126)
Anion gap: 13 (ref 5–15)
BUN: 14 mg/dL (ref 6–20)
CO2: 25 mmol/L (ref 22–32)
Calcium: 10 mg/dL (ref 8.9–10.3)
Chloride: 103 mmol/L (ref 98–111)
Creatinine, Ser: 0.82 mg/dL (ref 0.44–1.00)
GFR, Estimated: >60 mL/min (ref >60)
Glucose, Bld: 116 mg/dL — ABNORMAL HIGH (ref 70–99)
Potassium: 4 mmol/L (ref 3.5–5.1)
Sodium: 141 mmol/L (ref 135–145)
Total Bilirubin: 0.7 mg/dL (ref 0.3–1.2)
Total Protein: 7.7 g/dL (ref 6.5–8.1)

## 2020-10-29 LAB — CBC WITH DIFFERENTIAL/PLATELET
Abs Immature Granulocytes: 0.03 10*3/uL (ref 0.00–0.07)
Basophils Absolute: 0 10*3/uL (ref 0.0–0.1)
Basophils Relative: 1 %
Eosinophils Absolute: 0.2 10*3/uL (ref 0.0–0.5)
Eosinophils Relative: 3 %
HCT: 43 % (ref 36.0–46.0)
Hemoglobin: 14.7 g/dL (ref 12.0–15.0)
Immature Granulocytes: 0 %
Lymphocytes Relative: 19 %
Lymphs Abs: 1.3 10*3/uL (ref 0.7–4.0)
MCH: 29.3 pg (ref 26.0–34.0)
MCHC: 34.2 g/dL (ref 30.0–36.0)
MCV: 85.8 fL (ref 80.0–100.0)
Monocytes Absolute: 0.5 10*3/uL (ref 0.1–1.0)
Monocytes Relative: 7 %
Neutro Abs: 4.8 10*3/uL (ref 1.7–7.7)
Neutrophils Relative %: 70 %
Platelets: 219 10*3/uL (ref 150–400)
RBC: 5.01 MIL/uL (ref 3.87–5.11)
RDW: 11.7 % (ref 11.5–15.5)
WBC: 6.9 10*3/uL (ref 4.0–10.5)
nRBC: 0 % (ref 0.0–0.2)

## 2020-10-30 LAB — CANCER ANTIGEN 27.29: CA 27.29: 104.2 U/mL — ABNORMAL HIGH (ref 0.0–38.6)

## 2020-10-30 LAB — CEA (IN HOUSE-CHCC): CEA (CHCC-In House): 2.45 ng/mL (ref 0.00–5.00)

## 2020-10-31 ENCOUNTER — Other Ambulatory Visit: Payer: Self-pay | Admitting: Radiology

## 2020-11-03 ENCOUNTER — Ambulatory Visit (HOSPITAL_COMMUNITY)
Admission: RE | Admit: 2020-11-03 | Discharge: 2020-11-03 | Disposition: A | Discharge status: Home / Self Care | Payer: 59 | Source: Ambulatory Visit

## 2020-11-03 ENCOUNTER — Other Ambulatory Visit: Payer: Self-pay

## 2020-11-03 ENCOUNTER — Ambulatory Visit (HOSPITAL_COMMUNITY)
Admission: RE | Admit: 2020-11-03 | Discharge: 2020-11-03 | Disposition: A | Discharge status: Home / Self Care | Payer: 59 | Source: Ambulatory Visit | Attending: Oncology | Admitting: Oncology

## 2020-11-03 ENCOUNTER — Encounter (HOSPITAL_COMMUNITY): Payer: Self-pay

## 2020-11-03 DIAGNOSIS — Z86718 Personal history of other venous thrombosis and embolism: Secondary | ICD-10-CM | POA: Diagnosis not present

## 2020-11-03 DIAGNOSIS — C787 Secondary malignant neoplasm of liver and intrahepatic bile duct: Secondary | ICD-10-CM | POA: Diagnosis not present

## 2020-11-03 DIAGNOSIS — Z170 Estrogen receptor positive status [ER+]: Secondary | ICD-10-CM | POA: Insufficient documentation

## 2020-11-03 DIAGNOSIS — I10 Essential (primary) hypertension: Secondary | ICD-10-CM | POA: Insufficient documentation

## 2020-11-03 DIAGNOSIS — Z853 Personal history of malignant neoplasm of breast: Secondary | ICD-10-CM | POA: Diagnosis not present

## 2020-11-03 DIAGNOSIS — Z7901 Long term (current) use of anticoagulants: Secondary | ICD-10-CM | POA: Diagnosis not present

## 2020-11-03 DIAGNOSIS — Z86711 Personal history of pulmonary embolism: Secondary | ICD-10-CM | POA: Insufficient documentation

## 2020-11-03 DIAGNOSIS — Z79899 Other long term (current) drug therapy: Secondary | ICD-10-CM | POA: Diagnosis not present

## 2020-11-03 DIAGNOSIS — Z9049 Acquired absence of other specified parts of digestive tract: Secondary | ICD-10-CM | POA: Insufficient documentation

## 2020-11-03 DIAGNOSIS — K769 Liver disease, unspecified: Secondary | ICD-10-CM | POA: Diagnosis present

## 2020-11-03 LAB — PROTIME-INR
INR: 0.8 (ref 0.8–1.2)
Prothrombin Time: 11.2 seconds — ABNORMAL LOW (ref 11.4–15.2)

## 2020-11-03 LAB — COMPREHENSIVE METABOLIC PANEL
ALT: 32 U/L (ref 0–44)
AST: 28 U/L (ref 15–41)
Albumin: 4.3 g/dL (ref 3.5–5.0)
Alkaline Phosphatase: 130 U/L — ABNORMAL HIGH (ref 38–126)
Anion gap: 9 (ref 5–15)
BUN: 14 mg/dL (ref 6–20)
CO2: 24 mmol/L (ref 22–32)
Calcium: 9.7 mg/dL (ref 8.9–10.3)
Chloride: 106 mmol/L (ref 98–111)
Creatinine, Ser: 0.81 mg/dL (ref 0.44–1.00)
GFR, Estimated: >60 mL/min (ref >60)
Glucose, Bld: 132 mg/dL — ABNORMAL HIGH (ref 70–99)
Potassium: 4.1 mmol/L (ref 3.5–5.1)
Sodium: 139 mmol/L (ref 135–145)
Total Bilirubin: 1 mg/dL (ref 0.3–1.2)
Total Protein: 7.4 g/dL (ref 6.5–8.1)

## 2020-11-03 LAB — CBC WITH DIFFERENTIAL/PLATELET
Abs Immature Granulocytes: 0.03 10*3/uL (ref 0.00–0.07)
Basophils Absolute: 0 10*3/uL (ref 0.0–0.1)
Basophils Relative: 1 %
Eosinophils Absolute: 0.1 10*3/uL (ref 0.0–0.5)
Eosinophils Relative: 2 %
HCT: 44.3 % (ref 36.0–46.0)
Hemoglobin: 15.2 g/dL — ABNORMAL HIGH (ref 12.0–15.0)
Immature Granulocytes: 1 %
Lymphocytes Relative: 19 %
Lymphs Abs: 1 10*3/uL (ref 0.7–4.0)
MCH: 30.1 pg (ref 26.0–34.0)
MCHC: 34.3 g/dL (ref 30.0–36.0)
MCV: 87.7 fL (ref 80.0–100.0)
Monocytes Absolute: 0.3 10*3/uL (ref 0.1–1.0)
Monocytes Relative: 6 %
Neutro Abs: 3.9 10*3/uL (ref 1.7–7.7)
Neutrophils Relative %: 71 %
Platelets: 185 10*3/uL (ref 150–400)
RBC: 5.05 MIL/uL (ref 3.87–5.11)
RDW: 11.9 % (ref 11.5–15.5)
WBC: 5.5 10*3/uL (ref 4.0–10.5)
nRBC: 0 % (ref 0.0–0.2)

## 2020-11-03 IMAGING — US US BIOPSY CORE LIVER
1 series · 13 of 14 positions shown · non-contrast
Comparison: none

INDICATION: History of breast cancer with liver masses concerning for metastatic
disease.

[Series 1: us biopsy core liver · 13 of 14 slices shown]
[im 1/14]
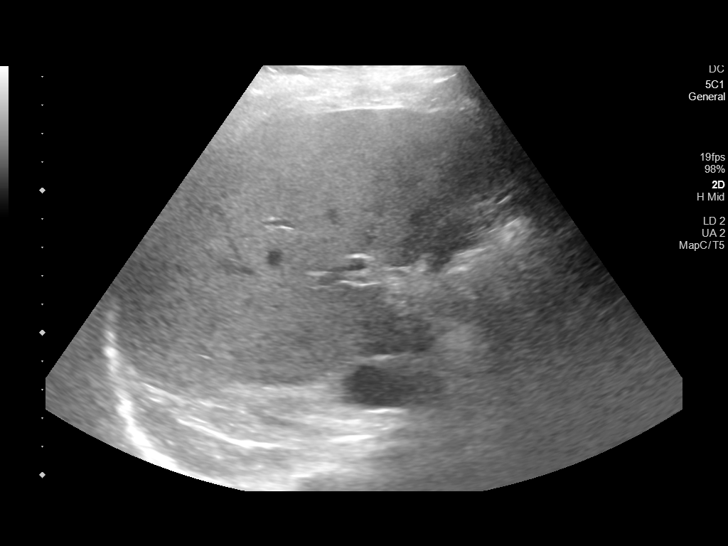
[im 2/14]
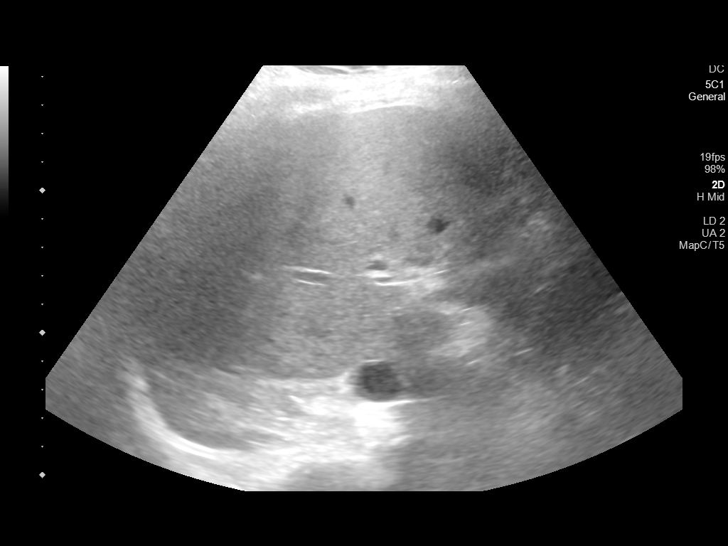
[im 3/14]
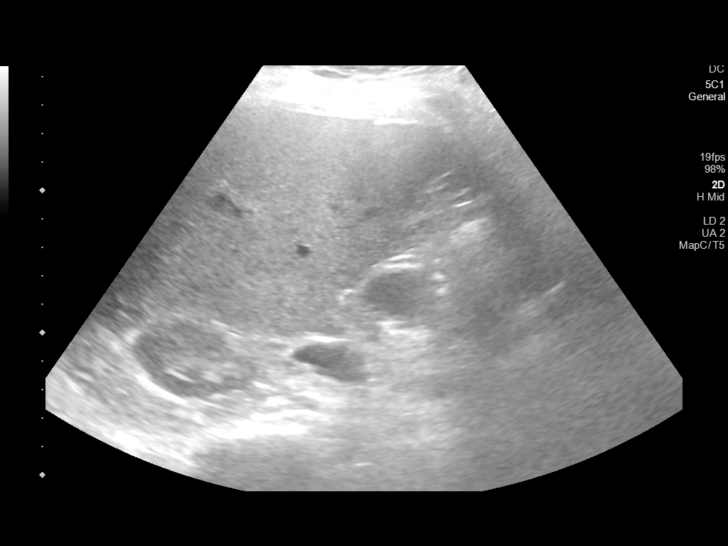
[im 4/14]
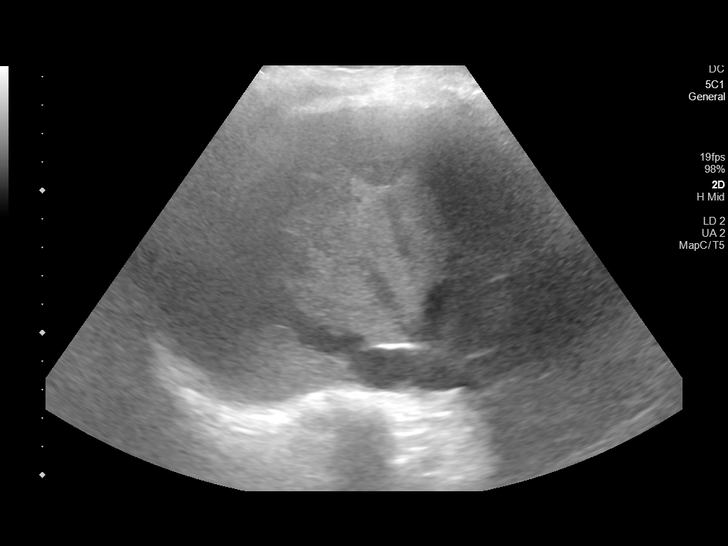
[im 5/14]
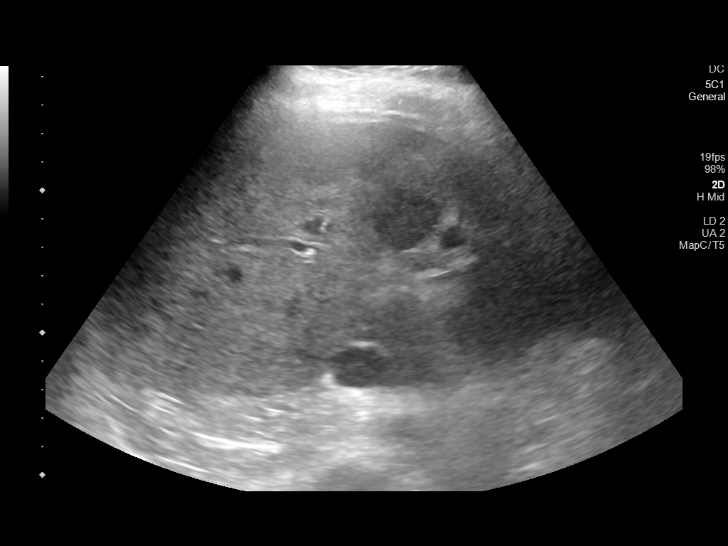
[im 6/14]
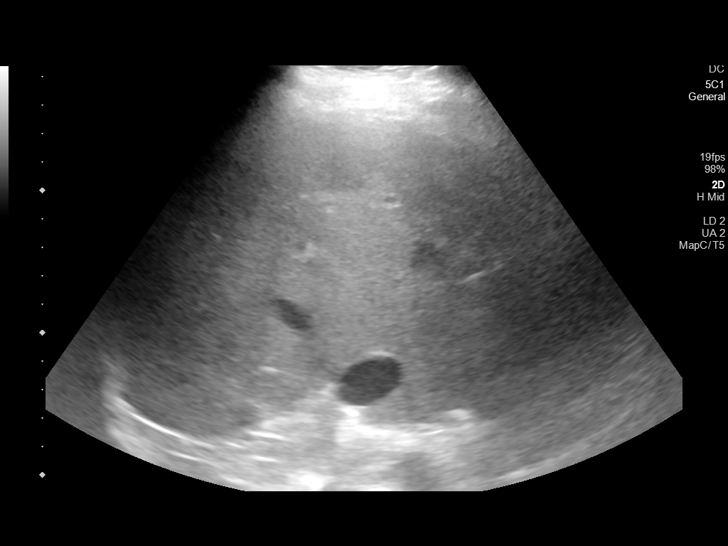
[im 8/14]
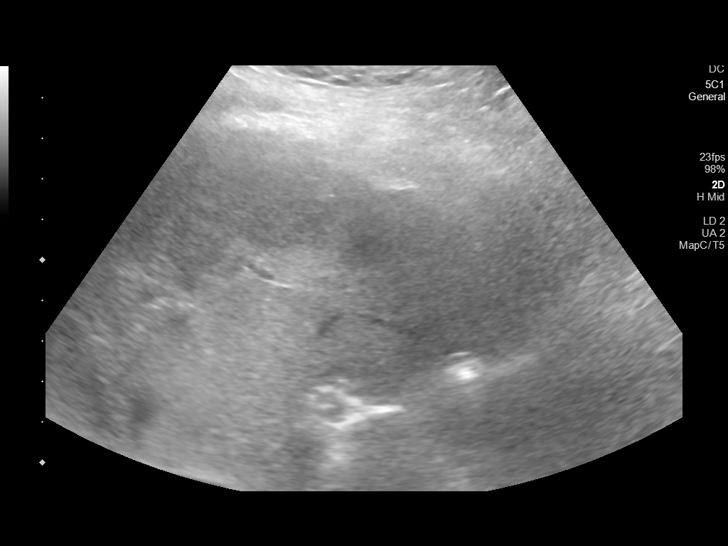
[im 9/14]
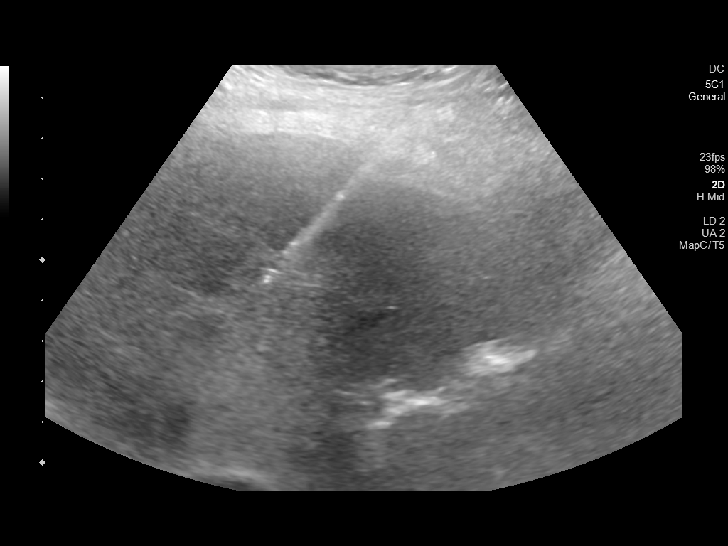
[im 10/14]
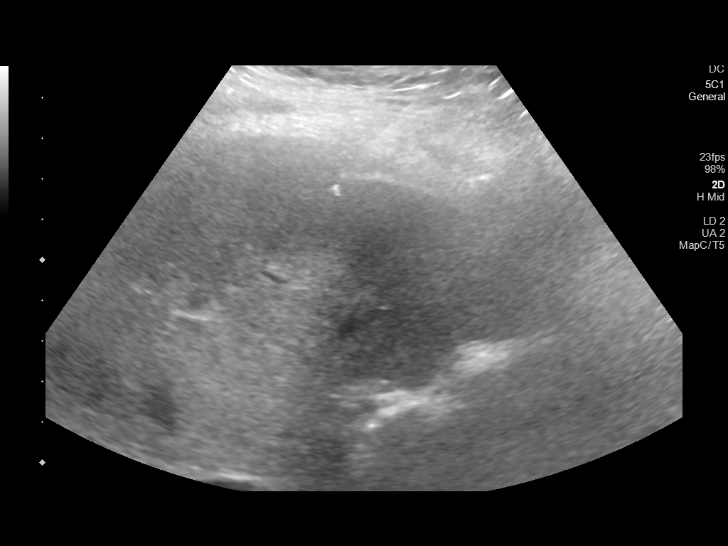
[im 11/14]
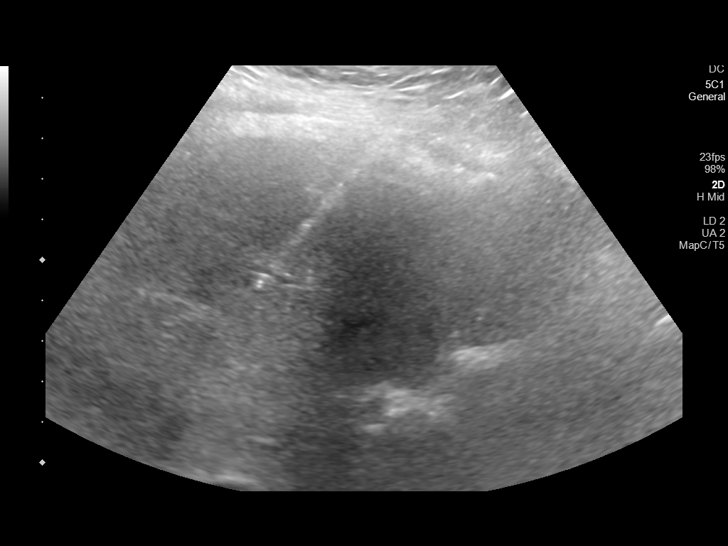
[im 12/14]
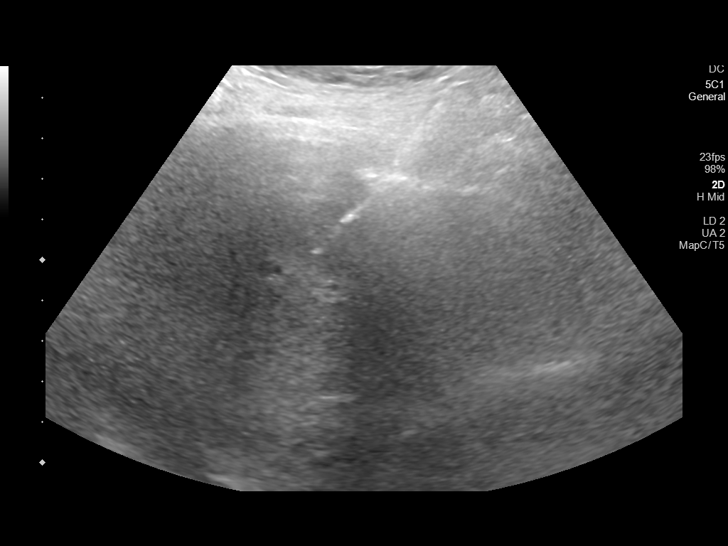
[im 13/14]
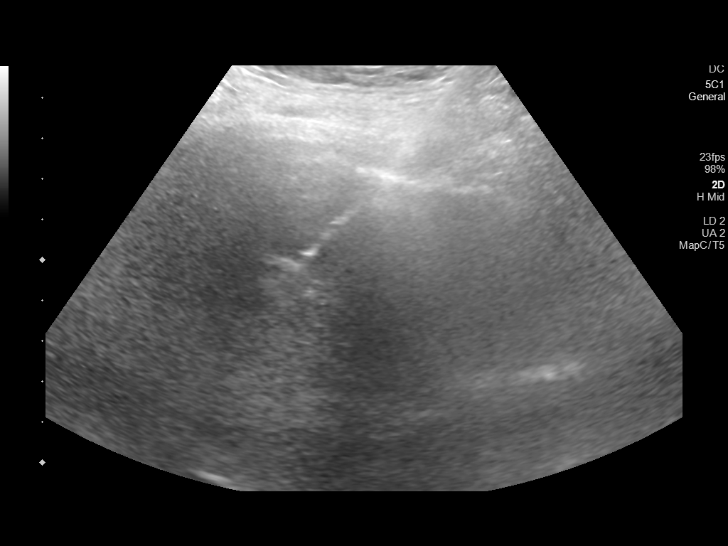
[im 14/14]
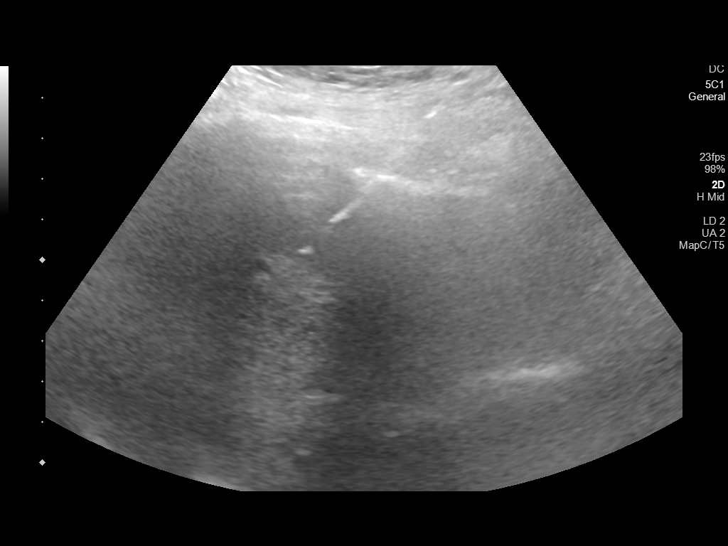

[13 of 14 positions shown; findings below may reference images not displayed]

EXAM:
ULTRASOUND BIOPSY CORE LIVER

MEDICATIONS:
None.

ANESTHESIA/SEDATION:
Moderate (conscious) sedation was employed during this procedure. A
total of Versed 2 mg and Fentanyl 100 mcg was administered
intravenously.

Moderate Sedation Time: 10 minutes. The patient's level of
consciousness and vital signs were monitored continuously by
radiology nursing throughout the procedure under my direct
supervision.

FLUOROSCOPY TIME:  None.

COMPLICATIONS:
None immediate.

PROCEDURE:
Informed written consent was obtained from the patient after a
thorough discussion of the procedural risks, benefits and
alternatives. All questions were addressed. Maximal Sterile Barrier
Technique was utilized including caps, mask, sterile gowns, sterile
gloves, sterile drape, hand hygiene and skin antiseptic. A timeout
was performed prior to the initiation of the procedure.

Ultrasound was used to interrogate the liver. The background hepatic
parenchyma is echogenic and coarsened consistent with hepatic
steatosis. Several ill-defined hypoechoic masses are present within
the superior aspect of the right hemi liver. A suitable mass was
targeted for biopsy. The overlying skin entry site was marked. The
skin was then sterilely prepped and draped in the standard fashion
using chlorhexidine skin prep. Local anesthesia was attained by
infiltration with 1% lidocaine. A small dermatotomy was made. Under
real-time ultrasound guidance, a 17 gauge introducer needle was
carefully advanced and positioned at the margin of the mass.
Multiple 18 gauge core biopsies were then coaxially obtained using
the JULIO automated biopsy device. Biopsy specimens were placed
in formalin and delivered to pathology for further analysis.

As the introducer needle was removed, the biopsy tract was embolized
with a Gel-Foam slurry. Post biopsy ultrasound imaging demonstrates
no evidence of immediate complication. The patient tolerated the
procedure well.
IMPRESSION: Technically successful ultrasound-guided core biopsy of liver
lesion.

## 2020-11-03 MED ORDER — FENTANYL CITRATE (PF) 100 MCG/2ML IJ SOLN
INTRAMUSCULAR | Status: AC | PRN
  Administered 2020-11-03 (×2): 50 ug via INTRAVENOUS

## 2020-11-03 MED ORDER — SODIUM CHLORIDE 0.9 % IV SOLN: INTRAVENOUS | Status: DC

## 2020-11-03 MED ORDER — GELATIN ABSORBABLE 12-7 MM EX MISC
CUTANEOUS | Status: AC
  Filled 2020-11-03: qty 1

## 2020-11-03 MED ORDER — LIDOCAINE HCL 1 % IJ SOLN
INTRAMUSCULAR | Status: AC
  Filled 2020-11-03: qty 20

## 2020-11-03 MED ORDER — MIDAZOLAM HCL 2 MG/2ML IJ SOLN
INTRAMUSCULAR | Status: AC | PRN
  Administered 2020-11-03 (×2): 1 mg via INTRAVENOUS

## 2020-11-03 MED ORDER — LIDOCAINE HCL (PF) 1 % IJ SOLN
INTRAMUSCULAR | Status: AC | PRN
  Administered 2020-11-03: 10 mL

## 2020-11-03 MED ORDER — FENTANYL CITRATE (PF) 100 MCG/2ML IJ SOLN
INTRAMUSCULAR | Status: AC
  Filled 2020-11-03: qty 2

## 2020-11-03 MED ORDER — MIDAZOLAM HCL 2 MG/2ML IJ SOLN
INTRAMUSCULAR | Status: AC
  Filled 2020-11-03: qty 2

## 2020-11-03 NOTE — Consult Note (Signed)
Chief Complaint: Patient was seen in consultation today for image guided liver lesion biopsy  Referring Physician(s): Chauncey Cruel  Supervising Physician: Jacqulynn Cadet  Patient Status: [facility] - Out-pt  History of Present Illness: Veronica [name] is a 50 y.o. female with history of hypertension, multiple VTE/PE and right breast carcinoma diagnosed in 2020, status post lumpectomy with chemoradiation.  Recent imaging has revealed numerous bilobar peripheral enhancing solid hepatic lesions concerning for metastatic disease.  She presents today for image guided liver lesion biopsy for further evaluation.  Past Medical History:  Diagnosis Date  . Cancer ([place])   . Chronic low back pain with left-sided sciatica   . Family history of leukemia   . Family history of stomach cancer   . Hypertension   . Personal history of chemotherapy   . Personal history of radiation therapy    finished june '21    Past Surgical History:  Procedure Laterality Date  . APPLICATION OF WOUND VAC N/A 10/16/2019   Procedure: APPLICATION OF WOUND VAC;  Surgeon: Gaye Pollack, MD;  Location: [facility] OR;  Service: Vascular;  Laterality: N/A;  . BREAST BIOPSY Right 05/02/2019   right clips X2  . BREAST LUMPECTOMY Right    06/2019  . BREAST LUMPECTOMY WITH RADIOACTIVE SEED AND SENTINEL LYMPH NODE BIOPSY Right 06/12/2019   Procedure: RIGHT BREAST RADIOACTIVE SEED LUMPECTOMY  AND RIGHT AXILLARY SEED TARGETED LYMPH NODE AND AXILLARY SENTINEL LYMPH NODE MAPPING;  Surgeon: Erroll Luna, MD;  Location: [place];  Service: General;  Laterality: Right;  PEC BLOCK  . C/S x2  '98 '03  . CENTRAL VENOUS CATHETER INSERTION  10/02/2019   Procedure: Insertion Central Line Adult;  Surgeon: Serafina Mitchell, MD;  Location: [place];  Service: Vascular;;  . CESAREAN SECTION    . CHOLECYSTECTOMY    . I & D EXTREMITY N/A 10/16/2019   Procedure: DEBRIDEMENT of DEHISCED MIDLINE SURGICAL INCISION;  Surgeon: Gaye Pollack, MD;  Location: [place] [facility];  Service: Vascular;  Laterality: N/A;  . LAPAROSCOPIC ASSISTED VAGINAL HYSTERECTOMY  05/17/2011   Procedure: LAPAROSCOPIC ASSISTED VAGINAL HYSTERECTOMY;  Surgeon: Sharene Butters;  Location: [place] [facility];  Service: Gynecology;  Laterality: N/A;  Laparoscopic Assisted Vaginal Hysterectomy With Lysis Of Adhesions  . PERICARDIAL WINDOW N/A 10/02/2019   Procedure: Pericardial Window;  Surgeon: Serafina Mitchell, MD;  Location: [place];  Service: Vascular;  Laterality: N/A;  . PORT-A-CATH REMOVAL  10/02/2019   Procedure: Removal Port-A-Cath;  Surgeon: Serafina Mitchell, MD;  Location: [place];  Service: Vascular;;  . PORTACATH PLACEMENT Right 06/12/2019   Procedure: INSERTION PORT-A-CATH WITH ULTRASOUND;  Surgeon: Erroll Luna, MD;  Location: [place];  Service: General;  Laterality: Right;  . RE-EXCISION OF BREAST LUMPECTOMY Right 07/17/2019   Procedure: RE-EXCISION OF RIGHT BREAST LUMPECTOMY;  Surgeon: Erroll Luna, MD;  Location: [place];  Service: General;  Laterality: Right;  . TUBAL LIGATION    . ULTRASOUND GUIDANCE FOR VASCULAR ACCESS  10/02/2019   Procedure: Ultrasound Guidance For Vascular Access;  Surgeon: Serafina Mitchell, MD;  Location: [facility] OR;  Service: Vascular;;  . VENOGRAM N/A 10/02/2019   Procedure: Central VENOGRAM;  Surgeon: Serafina Mitchell, MD;  Location: [facility] OR;  Service: Vascular;  Laterality: N/A;    Allergies: Patient has no known allergies.  Medications: Prior to Admission medications   Medication Sig Start Date End Date Taking? Authorizing Provider  anastrozole (ARIMIDEX) 1 MG tablet Take 1 tablet (1  mg total) by mouth daily. 04/02/20  Yes Magrinat, Valentino Hue, MD  Cholecalciferol (VITAMIN D3) 25 MCG (1000 UT) CHEW Chew 1 tablet by mouth daily.    Yes [provider]  metoprolol tartrate (LOPRESSOR) 25 MG tablet Take 1 tablet (25 mg total) by mouth 2 (two) times daily. 07/23/20 08/22/20 Yes Magrinat, Valentino Hue,  MD  gabapentin (NEURONTIN) 300 MG capsule Take 1 capsule (300 mg total) by mouth at bedtime. 08/05/20   Magrinat, Valentino Hue, MD  XARELTO 20 MG TABS tablet TAKE 1 TABLET BY MOUTH  DAILY WITH SUPPER 09/11/20   Magrinat, Valentino Hue, MD  prochlorperazine (COMPAZINE) 10 MG tablet Take 1 tablet (10 mg total) by mouth every 6 (six) hours as needed (Nausea or vomiting). 06/21/19 11/26/19  Magrinat, Valentino Hue, MD     Family History  Problem Relation Age of Onset  . Arthritis Mother   . COPD Mother   . Hypertension Mother   . Hyperlipidemia Mother   . Leukemia Brother 71  . Stomach cancer Maternal Grandmother        late 63s    Social History   Socioeconomic History  . Marital status: Married    Spouse name: Not on file  . Number of children: Not on file  . Years of education: Not on file  . Highest education level: Not on file  Occupational History  . Not on file  Tobacco Use  . Smoking status: Never Smoker  . Smokeless tobacco: Never Used  Vaping Use  . Vaping Use: Never used  Substance and Sexual Activity  . Alcohol use: Yes    Alcohol/week: 2.0 standard drinks    Types: 2 Standard drinks or equivalent per week    Comment: "2-3 a week"  . Drug use: No  . Sexual activity: Yes    Birth control/protection: Surgical  Other Topics Concern  . Not on file  Social History Narrative  . Not on file   Social Determinants of Health   Financial Resource Strain: Not on file  Food Insecurity: Not on file  Transportation Needs: Not on file  Physical Activity: Not on file  Stress: Not on file  Social Connections: Not on file     Review of Systems currently denies fever, headache, chest pain, dyspnea, cough, abdominal/back pain, nausea, vomiting or bleeding  Vital Signs: BP (!) 145/81 (BP Location: Left Arm)   Pulse 71   Temp 98.4 F (36.9 C)   Resp 20   SpO2 100%   Physical Exam awake, alert.  Chest clear to auscultation bilaterally.  Heart with regular rate and rhythm.  Abdominal  soft, positive bowel sounds, nontender.  No lower extremity edema.  Imaging: CT ANGIO CHEST PE W OR WO CONTRAST  Result Date: 10/07/2020 CLINICAL DATA:  History of pulmonary embolism on anticoagulation. Right breast cancer. EXAM: CT ANGIOGRAPHY CHEST WITH CONTRAST TECHNIQUE: Multidetector CT imaging of the chest was performed using the standard protocol during bolus administration of intravenous contrast. Multiplanar CT image reconstructions and MIPs were obtained to evaluate the vascular anatomy. CONTRAST:  15mL OMNIPAQUE IOHEXOL 350 MG/ML SOLN COMPARISON:  Chest CTA 10/13/2019. FINDINGS: Cardiovascular: The pulmonary arteries are well opacified with contrast to the level of the segmental branches. Peripheral evaluation is mildly limited by breathing artifact. There is no evidence of recurrent acute pulmonary embolism. No significant systemic arterial abnormalities are identified. The heart size is stable. There is no significant pericardial fluid. Mediastinum/Nodes: There are no enlarged mediastinal, hilar, axillary or internal mammary  lymph nodes. Surgical clips are present within the right axilla with decreased size of a presumed postoperative fluid collection, now measuring 3.8 cm on image 33/5. The thyroid gland, trachea and esophagus demonstrate no significant findings. Lungs/Pleura: Interval resolution of previously demonstrated bilateral pleural effusions. The aeration of the lung bases has improved with mild residual subsegmental atelectasis. There is diffuse central airway thickening, but no suspicious pulmonary nodularity. Upper abdomen: There are multiple ill-defined low-density hepatic lesions which were not visualized previously, highly worrisome for multifocal metastatic disease. Representative lesions include a 3.4 cm lesion laterally in the dome of the right lobe on image 61/5, a 4.5 cm lesion in segment 4A (image 68/5) and a 2.1 cm lesion in segment 3 (image 77/5). Previous cholecystectomy.  No adnexal mass. Musculoskeletal/Chest wall: Postsurgical changes in the right breast with diffuse dermal thickening. As above, decreased size of presumed postsurgical fluid collection laterally in the right breast. No suspicious chest wall mass or suspicious osseous findings. Review of the MIP images confirms the above findings. IMPRESSION: 1. No evidence of recurrent acute pulmonary embolism or other acute chest process. 2. Interval resolution of previously demonstrated bilateral pleural effusions and bibasilar atelectasis. 3. Multiple ill-defined low-density hepatic lesions were not visualized previously, highly worrisome for multifocal metastatic disease. These could be further evaluated with abdominal MRI if clinically warranted. 4. Postsurgical changes in the right breast and right axilla. Decreased size of presumed postsurgical fluid collection laterally in the right breast. No evidence of locally recurrent breast cancer or thoracic metastatic disease. Electronically Signed   By: Richardean Sale M.D.   On: 10/07/2020 12:46   MR LIVER W WO CONTRAST  Result Date: 10/22/2020 CLINICAL DATA:  Hepatic lesions visualized on chest CT concerning for metastatic disease in the setting of breast cancer. EXAM: MRI ABDOMEN WITHOUT AND WITH CONTRAST TECHNIQUE: Multiplanar multisequence MR imaging of the abdomen was performed both before and after the administration of intravenous contrast. CONTRAST:  39mL GADAVIST GADOBUTROL 1 MMOL/ML IV SOLN COMPARISON:  CTA chest October 07, 2020. FINDINGS: Lower chest: Similar scattered subsegmental atelectasis in the bases. Hepatobiliary: There are numerous bilobar peripherally enhancing solid hepatic lesions. For reference: -segment VII lesion which measures 2.0 cm (series 4, image 10) -segment V/VIII 8 lesion which measures 2.5 cm (series 4, image 14). -segment III lesion which measures 6 mm (series 4, image 20) Pancreas: No mass, inflammatory changes, or other parenchymal  abnormality identified. Spleen:  Within normal limits in size and appearance. Adrenals/Urinary Tract: No masses identified. No evidence of hydronephrosis. Stomach/Bowel: Visualized portions within the abdomen are unremarkable. Vascular/Lymphatic: No pathologically enlarged lymph nodes identified. No abdominal aortic aneurysm demonstrated. Other:  No ascites. Musculoskeletal: No suspicious bone lesions identified. Postsurgical change in the right breast and axilla. IMPRESSION: Numerous bilobar peripherally enhancing solid hepatic lesions, consistent with metastatic disease. Electronically Signed   By: Dahlia Bailiff MD   On: 10/22/2020 18:37   VAS [place] UPPER EXTREMITY VENOUS DUPLEX  Result Date: 10/07/2020 UPPER VENOUS STUDY  Other Indications: Follow up DVT study.  Comparison Study: Prior Study Available 06/23/2020. Chronic DVT still seen in                   right IJV Performing Technologist: Rogelia Rohrer  Examination Guidelines: A complete evaluation includes B-mode imaging, spectral Doppler, color Doppler, and power Doppler as needed of all accessible portions of each vessel. Bilateral testing is considered an integral part of a complete examination. Limited examinations for reoccurring indications may be performed as  noted.  Right Findings: +----------+------------+---------+-----------+----------+-------+ RIGHT     CompressiblePhasicitySpontaneousPropertiesSummary +----------+------------+---------+-----------+----------+-------+ IJV           None       No        No                       +----------+------------+---------+-----------+----------+-------+ Subclavian    Full       Yes       Yes                      +----------+------------+---------+-----------+----------+-------+ Axillary      Full       Yes       Yes                      +----------+------------+---------+-----------+----------+-------+ Brachial      Full       Yes       Yes                       +----------+------------+---------+-----------+----------+-------+ Radial        Full       Yes       Yes                      +----------+------------+---------+-----------+----------+-------+ Ulnar         Full       Yes       Yes                      +----------+------------+---------+-----------+----------+-------+ Cephalic      Full                                          +----------+------------+---------+-----------+----------+-------+ Basilic       Full                                          +----------+------------+---------+-----------+----------+-------+  Left Findings: +----------+------------+---------+-----------+----------+-------+ LEFT      CompressiblePhasicitySpontaneousPropertiesSummary +----------+------------+---------+-----------+----------+-------+ IJV           Full       Yes       Yes                      +----------+------------+---------+-----------+----------+-------+ Subclavian    Full       Yes       Yes                      +----------+------------+---------+-----------+----------+-------+ Axillary      Full       Yes       Yes                      +----------+------------+---------+-----------+----------+-------+ Brachial      Full       Yes       Yes                      +----------+------------+---------+-----------+----------+-------+ Radial        Full       Yes       Yes                      +----------+------------+---------+-----------+----------+-------+  Ulnar         Full       Yes       Yes                      +----------+------------+---------+-----------+----------+-------+ Cephalic      Full                                          +----------+------------+---------+-----------+----------+-------+ Basilic       Full                                          +----------+------------+---------+-----------+----------+-------+  *See table(s) above for measurements and observations.   Diagnosing physician: Servando Snare MD Electronically signed by Servando Snare MD on 10/07/2020 at 2:12:05 PM.    Final     Labs:  CBC: Recent Labs    04/24/20 1035 08/05/20 1045 10/29/20 1508 11/03/20 1143  WBC 5.3 5.8 6.9 5.5  HGB 14.4 15.3* 14.7 15.2*  HCT 42.1 44.9 43.0 44.3  PLT 170 184 219 185    COAGS: Recent Labs    01/14/20 1123  INR 1.4*    BMP: Recent Labs    11/15/19 1129 01/14/20 1123 04/24/20 1035 08/05/20 1045 10/07/20 1206 10/29/20 1508  NA 139 144 140 138  --  141  K 4.4 4.6 4.1 4.4  --  4.0  CL 105 108 107 106  --  103  CO2 25 25 25 25   --  25  GLUCOSE 137* 113* 105* 118*  --  116*  BUN 10 14 13 14   --  14  CALCIUM 9.8 9.9 9.4 9.9  --  10.0  CREATININE 0.74 0.76 0.75 0.79 0.80 0.82  GFRNONAA >60 >60 >60 >60  --  >60  GFRAA >60 >60 >60  --   --   --     LIVER FUNCTION TESTS: Recent Labs    01/14/20 1123 04/24/20 1035 08/05/20 1045 10/29/20 1508  BILITOT 1.1 1.1 1.2 0.7  AST 24 23 31 29   ALT 24 20 37 35  ALKPHOS 63 74 101 176*  PROT 6.7 6.8 6.8 7.7  ALBUMIN 3.9 3.8 3.8 4.1    TUMOR MARKERS: No results for input(s): AFPTM, CEA, CA199, CHROMGRNA in the last 8760 hours.  Assessment and Plan: 50 y.o. female with history of hypertension, multiple VTE/PE and right breast carcinoma diagnosed in 2020, status post lumpectomy with chemoradiation.  Recent imaging has revealed numerous bilobar peripheral enhancing solid hepatic lesions concerning for metastatic disease.  She presents today for image guided liver lesion biopsy for further evaluation.Risks and benefits of procedure was discussed with the patient  including, but not limited to bleeding, infection, damage to adjacent structures or low yield requiring additional tests.  All of the questions were answered and there is agreement to proceed.  Consent signed and in chart.     Thank you for this interesting consult.  I greatly enjoyed meeting AVEENA BARI and look forward to  participating in their care.  A copy of this report was sent to the requesting provider on this date.  Electronically Signed: D. Rowe Robert, PA-C 11/03/2020, 11:58 AM   I spent a total of  25 minutes   in face to face in clinical consultation,  greater than 50% of which was counseling/coordinating care for image guided liver lesion biopsy

## 2020-11-03 NOTE — Discharge Instructions (Signed)
Please call Interventional Radiology clinic [phone] with any questions or concerns.  You may remove your dressing and shower tomorrow.   Liver Biopsy, Care After These instructions give you information on caring for yourself after your procedure. Your doctor may also give you more specific instructions. Call your doctor if you have any problems or questions after your procedure. What can I expect after the procedure? After the procedure, it is common to have:  Pain and soreness where the biopsy was done.  Bruising around the area where the biopsy was done.  Sleepiness and be tired for a few days. Follow these instructions at home: Medicines  Take over-the-counter and prescription medicines only as told by your doctor.  If you were prescribed an antibiotic medicine, take it as told by your doctor. Do not stop taking the antibiotic even if you start to feel better.  Do not take medicines such as aspirin and ibuprofen. These medicines can thin your blood. Do not take these medicines unless your doctor tells you to take them.  If you are taking prescription pain medicine, take actions to prevent or treat constipation. Your doctor may recommend that you: ? Drink enough fluid to keep your pee (urine) clear or pale yellow. ? Take over-the-counter or prescription medicines. ? Eat foods that are high in fiber, such as fresh fruits and vegetables, whole grains, and beans. ? Limit foods that are high in fat and processed sugars, such as fried and sweet foods. Caring for your cut  Follow instructions from your doctor about how to take care of your cuts from surgery (incisions). Make sure you: ? Wash your hands with soap and water before you change your bandage (dressing). If you cannot use soap and water, use hand sanitizer. ? Change your bandage as told by your doctor. ? Leave stitches (sutures), skin glue, or skin tape (adhesive) strips in place. They may need to stay in place for 2  weeks or longer. If tape strips get loose and curl up, you may trim the loose edges. Do not remove tape strips completely unless your doctor says it is okay.  Check your cuts every day for signs of infection. Check for: ? Redness, swelling, or more pain. ? Fluid or blood. ? Pus or a bad smell. ? Warmth.  Do not take baths, swim, or use a hot tub until your doctor says it is okay to do so. Activity  Rest at home for 1-2 days or as told by your doctor. ? Avoid sitting for a long time without moving. Get up to take short walks every 1-2 hours.  Return to your normal activities as told by your doctor. Ask what activities are safe for you.  Do not do these things in the first 24 hours: ? Drive. ? Use machinery. ? Take a bath or shower.  Do not lift more than 10 pounds (4.5 kg) or play contact sports for the first 2 weeks.   General instructions  Do not drink alcohol in the first week after the procedure.  Have someone stay with you for at least 24 hours after the procedure.  Get your test results. Ask your doctor or the department that is doing the test: ? When will my results be ready? ? How will I get my results? ? What are my treatment options? ? What other tests do I need? ? What are my next steps?  Keep all follow-up visits as told by your doctor. This is important.   Contact   a doctor if:  A cut bleeds and leaves more than just a small spot of blood.  A cut is red, puffs up (swells), or hurts more than before.  Fluid or something else comes from a cut.  A cut smells bad.  You have a fever or chills. Get help right away if:  You have swelling, bloating, or pain in your belly (abdomen).  You get dizzy or faint.  You have a rash.  You feel sick to your stomach (nauseous) or throw up (vomit).  You have trouble breathing, feel short of breath, or feel faint.  Your chest hurts.  You have problems talking or seeing.  You have trouble with your balance or moving  your arms or legs. Summary  After the procedure, it is common to have pain, soreness, bruising, and tiredness.  Your doctor will tell you how to take care of yourself at home. Change your bandage, take your medicines, and limit your activities as told by your doctor.  Call your doctor if you have symptoms of infection. Get help right away if your belly swells, your cut bleeds a lot, or you have trouble talking or breathing. This information is not intended to replace advice given to you by your health care provider. Make sure you discuss any questions you have with your health care provider. Document Revised: 10/06/2017 Document Reviewed: 10/07/2017 Elsevier Patient Education  2021 Elsevier Inc.   Moderate Conscious Sedation, Adult, Care After This sheet gives you information about how to care for yourself after your procedure. Your health care provider may also give you more specific instructions. If you have problems or questions, contact your health care provider. What can I expect after the procedure? After the procedure, it is common to have:  Sleepiness for several hours.  Impaired judgment for several hours.  Difficulty with balance.  Vomiting if you eat too soon. Follow these instructions at home: For the time period you were told by your health care provider:  Rest.  Do not participate in activities where you could fall or become injured.  Do not drive or use machinery.  Do not drink alcohol.  Do not take sleeping pills or medicines that cause drowsiness.  Do not make important decisions or sign legal documents.  Do not take care of children on your own.      Eating and drinking  Follow the diet recommended by your health care provider.  Drink enough fluid to keep your urine pale yellow.  If you vomit: ? Drink water, juice, or soup when you can drink without vomiting. ? Make sure you have little or no nausea before eating solid foods.   General  instructions  Take over-the-counter and prescription medicines only as told by your health care provider.  Have a responsible adult stay with you for the time you are told. It is important to have someone help care for you until you are awake and alert.  Do not smoke.  Keep all follow-up visits as told by your health care provider. This is important. Contact a health care provider if:  You are still sleepy or having trouble with balance after 24 hours.  You feel light-headed.  You keep feeling nauseous or you keep vomiting.  You develop a rash.  You have a fever.  You have redness or swelling around the IV site. Get help right away if:  You have trouble breathing.  You have new-onset confusion at home. Summary  After the procedure, it is   common to feel sleepy, have impaired judgment, or feel nauseous if you eat too soon.  Rest after you get home. Know the things you should not do after the procedure.  Follow the diet recommended by your health care provider and drink enough fluid to keep your urine pale yellow.  Get help right away if you have trouble breathing or new-onset confusion at home. This information is not intended to replace advice given to you by your health care provider. Make sure you discuss any questions you have with your health care provider. Document Revised: 01/25/2020 Document Reviewed: 08/23/2019 Elsevier Patient Education  2021 Elsevier Inc.  

## 2020-11-03 NOTE — Procedures (Signed)
Interventional Radiology Procedure Note  Procedure: US guided biopsy of liver lesion  Complications: None  Estimated Blood Loss: None  Recommendations: - Bedrest x 2 hrs - DC home   Signed,  Avarey Yaeger K. Kol Consuegra, MD    

## 2020-11-04 ENCOUNTER — Other Ambulatory Visit: Payer: Self-pay | Admitting: Oncology

## 2020-11-06 ENCOUNTER — Other Ambulatory Visit: Payer: Self-pay | Admitting: Oncology

## 2020-11-06 NOTE — Progress Notes (Signed)
I called Veronica [name] and gave her the preliminary results of her biopsy which show estrogen receptor positive metastatic disease.  She will see me again 11/12/2020 to further discuss

## 2020-11-07 LAB — SURGICAL PATHOLOGY

## 2020-11-11 ENCOUNTER — Other Ambulatory Visit: Payer: Self-pay | Admitting: Oncology

## 2020-11-12 ENCOUNTER — Inpatient Hospital Stay: Payer: 59 | Attending: Oncology | Admitting: Oncology

## 2020-11-12 ENCOUNTER — Other Ambulatory Visit: Payer: Self-pay

## 2020-11-12 VITALS — BP 138/77 | HR 92 | Temp 97.3°F | Resp 17 | Ht 61.0 in | Wt 216.5 lb

## 2020-11-12 DIAGNOSIS — Z79899 Other long term (current) drug therapy: Secondary | ICD-10-CM | POA: Insufficient documentation

## 2020-11-12 DIAGNOSIS — C50411 Malignant neoplasm of upper-outer quadrant of right female breast: Secondary | ICD-10-CM

## 2020-11-12 DIAGNOSIS — Z7189 Other specified counseling: Secondary | ICD-10-CM | POA: Diagnosis not present

## 2020-11-12 DIAGNOSIS — Z170 Estrogen receptor positive status [ER+]: Secondary | ICD-10-CM

## 2020-11-12 DIAGNOSIS — C787 Secondary malignant neoplasm of liver and intrahepatic bile duct: Secondary | ICD-10-CM | POA: Insufficient documentation

## 2020-11-12 DIAGNOSIS — Z171 Estrogen receptor negative status [ER-]: Secondary | ICD-10-CM | POA: Diagnosis not present

## 2020-11-12 DIAGNOSIS — C50412 Malignant neoplasm of upper-outer quadrant of left female breast: Secondary | ICD-10-CM | POA: Insufficient documentation

## 2020-11-12 MED ORDER — ONDANSETRON HCL 8 MG PO TABS: 8.0000 mg | ORAL_TABLET | Freq: Two times a day (BID) | ORAL | 1 refills | Status: DC | PRN

## 2020-11-12 MED ORDER — PROCHLORPERAZINE MALEATE 10 MG PO TABS: 10.0000 mg | ORAL_TABLET | Freq: Four times a day (QID) | ORAL | 1 refills | Status: DC | PRN

## 2020-11-12 NOTE — Progress Notes (Signed)
DISCONTINUE ON PATHWAY REGIMEN - Breast   Dose-Dense AC q14 days:   A cycle is every 14 days:     Doxorubicin      Cyclophosphamide      [id number]   **Always confirm dose/schedule in your pharmacy ordering system**  Paclitaxel 80 mg/m2 Weekly:   Administer weekly:     Paclitaxel   **Always confirm dose/schedule in your pharmacy ordering system**  REASON: Disease Progression PRIOR TREATMENT: [id number]: Dose-Dense AC-T (Paclitaxel Weekly) - [Doxorubicin + Cyclophosphamide q14 Days x 4 Cycles, Followed by Paclitaxel 80 mg/m2 Weekly x 12 Weeks] TREATMENT RESPONSE: Unable to Evaluate  START ON PATHWAY REGIMEN - Breast     A cycle is every 28 days (3 weeks on and 1 week off):     Paclitaxel   **Always confirm dose/schedule in your pharmacy ordering system**  Patient Characteristics: Distant Metastases or Locoregional Recurrent Disease - Unresected or Locally Advanced Unresectable Disease Progressing after Neoadjuvant and Local Therapies, HER2 Negative/Unknown/Equivocal, ER Negative/Unknown, Chemotherapy, First Line, ER Negative,  PD-L1 Expression Negative/Unknown Therapeutic Status: Distant Metastases ER Status: Negative (-) HER2 Status: Negative (-) PR Status: Negative (-) Therapy Approach Indicated: Standard Chemotherapy/Endocrine Therapy Line of Therapy: First Line PD-L1 Expression Status: Did Not Order Test Intent of Therapy: Non-Curative / Palliative Intent, Discussed with Patient

## 2020-11-12 NOTE — Progress Notes (Signed)
START ON PATHWAY REGIMEN - Breast     A cycle is every 28 days (3 weeks on and 1 week off):     Paclitaxel   **Always confirm dose/schedule in your pharmacy ordering system**  Patient Characteristics: Distant Metastases or Locoregional Recurrent Disease - Unresected or Locally Advanced Unresectable Disease Progressing after Neoadjuvant and Local Therapies, HER2 Negative/Unknown/Equivocal, ER Negative/Unknown, Chemotherapy, First Line, ER Negative,  PD-L1 Expression Negative/Unknown Therapeutic Status: Distant Metastases ER Status: Negative (-) HER2 Status: Negative (-) PR Status: Negative (-) Therapy Approach Indicated: Standard Chemotherapy/Endocrine Therapy Line of Therapy: First Line PD-L1 Expression Status: Awaiting Test Results Intent of Therapy: Non-Curative / Palliative Intent, Discussed with Patient

## 2020-11-12 NOTE — Progress Notes (Signed)
[place]  [phone] [phone]     ID: Veronica [name] DOB: 1971/09/25  MR#: [id number]  KCM#:[id number]  Patient Care Team: Jamie Kato as PCP - General (Family Medicine) Mauro Kaufmann, RN as Oncology Nurse Navigator [name], RN as Oncology Nurse Navigator Erroll Luna, MD as Consulting Physician (General Surgery) Carlos Quackenbush, Virgie Dad, MD as Consulting Physician (Oncology) Kyung Rudd, MD as Consulting Physician (Radiation Oncology) Gaye Pollack, MD as Consulting Physician (Cardiothoracic Surgery) Serafina Mitchell, MD as Consulting Physician (Vascular Surgery) Chauncey Cruel, MD OTHER MD:  CHIEF COMPLAINT: Estrogen receptor positive breast cancer  CURRENT TREATMENT: Taxol, Beryle Flock   INTERVAL HISTORY: Veronica [name] returns today for follow-up of her estrogen receptor positive breast cancer accompanied by her husband Veronica [name].   Since her last visit, she underwent angio chest CT on 10/07/2020 showing: no evidence of recurrent acute pulmonary embolism or other acute chest process; interval resolution of previously-demonstrated bilateral pulmonary effusions and bibasilar atelectasis; also multiple ill-define low-density hepatic lesions not previously visualized in the earlier CT angio obtained January 2021.  She proceeded to liver MRI on 10/22/2020 showing: numerous bilobar peripherally-enhancing solid hepatic lesions.  She then underwent liver biopsy on 11/03/2020. Pathology from the procedure [phone]) confirmed metastatic carcinoma. Prognostic panel significant for: estrogen receptor 10% positive with weak staining intensity; progesterone receptor 0% negative; Her2 negative by immunohistochemistry (1+).  She started anastrozole May 2021.  Hot flashes are mild during the day but a real problem at night when they keep her up.  Vaginal dryness has not been an issue.  Her most recent bone density screening on 08/01/2020  showed a T-score of +0.1, which is normal.   REVIEW OF SYSTEMS: Veronica [name] is remarkably asymptomatic.  She did have significant pain in the right upper quadrant at one point, and that lasted a few days, but it eventually resolved.  She has no nausea vomiting or taste alteration.  A detailed review of systems was otherwise stable.   COVID 19 VACCINATION STATUS: refuses vaccination, had COVID 29 September 2019   HISTORY OF CURRENT ILLNESS: From the original intake note:  Veronica [name] had routine screening mammography on 04/27/2019 showing a possible abnormality in the right breast. She underwent right diagnostic mammography with tomography and right breast ultrasonography at [facility] on 05/01/2019 showing: breast density category C; highly suspicious 1.7 cm irregular mass in the right breast at 10 o'clock, 8 cm from the nipple; indeterminate hypoechoic round mass in the right axillary tail/low axilla. Physical exam showed no suspicious lumps.  Accordingly on 05/02/2019 she proceeded to biopsy of the right breast area in question. The pathology from this procedure ([id number]) showed: invasive ductal carcinoma, grade 3; ductal carcinoma in situ; lymphovascular invasion present. Prognostic indicators significant for: estrogen receptor, 30% positive with moderate staining intensity, and progesterone receptor, 30% positive with strong staining intensity. Proliferation marker Ki67 at 20%. HER2 negative by immunohistochemistry (1+).  The second "satellite" lesion was a lymph node which was also positive.  The patient's subsequent history is as detailed below.   PAST MEDICAL HISTORY: Past Medical History:  Diagnosis Date  . Cancer ([place])   . Chronic low back pain with left-sided sciatica   . Family history of leukemia   . Family history of stomach cancer   . Hypertension   . Personal history of chemotherapy   . Personal history of radiation therapy    finished june '21    PAST SURGICAL  HISTORY:  Past Surgical History:  Procedure Laterality Date  . APPLICATION OF WOUND VAC N/A 10/16/2019   Procedure: APPLICATION OF WOUND VAC;  Surgeon: Gaye Pollack, MD;  Location: [facility];  Service: Vascular;  Laterality: N/A;  . BREAST BIOPSY Right 05/02/2019   right clips X2  . BREAST LUMPECTOMY Right    06/2019  . BREAST LUMPECTOMY WITH RADIOACTIVE SEED AND SENTINEL LYMPH NODE BIOPSY Right 06/12/2019   Procedure: RIGHT BREAST RADIOACTIVE SEED LUMPECTOMY  AND RIGHT AXILLARY SEED TARGETED LYMPH NODE AND AXILLARY SENTINEL LYMPH NODE MAPPING;  Surgeon: Erroll Luna, MD;  Location: [facility];  Service: General;  Laterality: Right;  PEC BLOCK  . C/S x2  '98 '03  . CENTRAL VENOUS CATHETER INSERTION  10/02/2019   Procedure: Insertion Central Line Adult;  Surgeon: Serafina Mitchell, MD;  Location: [facility];  Service: Vascular;;  . CESAREAN SECTION    . CHOLECYSTECTOMY    . I & D EXTREMITY N/A 10/16/2019   Procedure: DEBRIDEMENT of DEHISCED MIDLINE SURGICAL INCISION;  Surgeon: Gaye Pollack, MD;  Location: [facility];  Service: Vascular;  Laterality: N/A;  . LAPAROSCOPIC ASSISTED VAGINAL HYSTERECTOMY  05/17/2011   Procedure: LAPAROSCOPIC ASSISTED VAGINAL HYSTERECTOMY;  Surgeon: Sharene Butters;  Location: [facility];  Service: Gynecology;  Laterality: N/A;  Laparoscopic Assisted Vaginal Hysterectomy With Lysis Of Adhesions  . PERICARDIAL WINDOW N/A 10/02/2019   Procedure: Pericardial Window;  Surgeon: Serafina Mitchell, MD;  Location: [facility];  Service: Vascular;  Laterality: N/A;  . PORT-A-CATH REMOVAL  10/02/2019   Procedure: Removal Port-A-Cath;  Surgeon: Serafina Mitchell, MD;  Location: [facility];  Service: Vascular;;  . PORTACATH PLACEMENT Right 06/12/2019   Procedure: INSERTION PORT-A-CATH WITH ULTRASOUND;  Surgeon: Erroll Luna, MD;  Location: [facility];  Service: General;  Laterality: Right;  . RE-EXCISION OF BREAST LUMPECTOMY Right 07/17/2019   Procedure: RE-EXCISION OF RIGHT  BREAST LUMPECTOMY;  Surgeon: Erroll Luna, MD;  Location: [facility];  Service: General;  Laterality: Right;  . TUBAL LIGATION    . ULTRASOUND GUIDANCE FOR VASCULAR ACCESS  10/02/2019   Procedure: Ultrasound Guidance For Vascular Access;  Surgeon: Serafina Mitchell, MD;  Location: [facility] OR;  Service: Vascular;;  . VENOGRAM N/A 10/02/2019   Procedure: Central VENOGRAM;  Surgeon: Serafina Mitchell, MD;  Location: [facility];  Service: Vascular;  Laterality: N/A;    FAMILY HISTORY: Family History  Problem Relation Age of Onset  . Arthritis Mother   . COPD Mother   . Hypertension Mother   . Hyperlipidemia Mother   . Leukemia Brother 69  . Stomach cancer Maternal Grandmother        late 84s   Patient's parents are living (as of September 2020). Her father is 77 and her mother is 51 as of 04/2019. The patient denies a family hx of breast or ovarian cancer. She has 7 siblings, 6 brothers and 1 sister. She reports her maternal grandmother was diagnosed with stomach cancer at age 82. Her brother was diagnosed with leukemia at age 64.   GYNECOLOGIC HISTORY:  No LMP recorded. Patient has had a hysterectomy. Menarche: 50 years old Age at first live birth: 50 years old GX P 2 (+1 stillborn) LMP age 27-42 Contraceptive: unsure, maybe for a year at age 70. HRT no Hysterectomy? Yes, 05/2011 BSO? 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 She is married. Husband Veronica [name]") is a Hydrologist for a  telephone company. She lives at home with her husband and two daughters. Daughter Veronica [name], age 45, is a Scientist, water quality. Daughter Veronica [name], age 13, is a Ship broker.     ADVANCED DIRECTIVES: Husband Veronica [name] is her HCPOA.   HEALTH MAINTENANCE: Social History   Tobacco Use  . Smoking status: Never Smoker  . Smokeless tobacco: Never Used  Vaping Use  . Vaping Use: Never used  Substance Use Topics  . Alcohol use: Yes    Alcohol/week: 2.0 standard  drinks    Types: 2 Standard drinks or equivalent per week    Comment: "2-3 a week"  . Drug use: No     Colonoscopy: n/a  PAP: 02/2018  Bone density: n/a   No Known Allergies  Current Outpatient Medications  Medication Sig Dispense Refill  . anastrozole (ARIMIDEX) 1 MG tablet Take 1 tablet (1 mg total) by mouth daily. 90 tablet 4  . Cholecalciferol (VITAMIN D3) 25 MCG (1000 UT) CHEW Chew 1 tablet by mouth daily.     Marland Kitchen gabapentin (NEURONTIN) 300 MG capsule Take 1 capsule (300 mg total) by mouth at bedtime. 90 capsule 4  . metoprolol tartrate (LOPRESSOR) 25 MG tablet Take 1 tablet (25 mg total) by mouth 2 (two) times daily. 60 tablet 0  . ondansetron (ZOFRAN) 8 MG tablet Take 1 tablet (8 mg total) by mouth 2 (two) times daily as needed (Nausea or vomiting). 30 tablet 1  . prochlorperazine (COMPAZINE) 10 MG tablet Take 1 tablet (10 mg total) by mouth every 6 (six) hours as needed (Nausea or vomiting). 90 tablet 1  . XARELTO 20 MG TABS tablet TAKE 1 TABLET BY MOUTH  DAILY WITH SUPPER 90 tablet 3   No current facility-administered medications for this visit.    OBJECTIVE: white woman in no acute distress  Vitals:   11/12/20 1621  BP: 138/77  Pulse: 92  Resp: 17  Temp: (!) 97.3 F (36.3 C)  SpO2: 100%   Wt Readings from Last 3 Encounters:  11/12/20 216 lb 8 oz (98.2 kg)  08/05/20 214 lb 4.8 oz (97.2 kg)  06/23/20 211 lb 12.8 oz (96.1 kg)   Body mass index is 40.91 kg/m.    ECOG FS:1 - Symptomatic but completely ambulatory  Sclerae unicteric, EOMs intact Wearing a mask No cervical or supraclavicular adenopathy Lungs no rales or rhonchi Heart regular rate and rhythm Abd soft, mild tenderness in the right upper quadrant laterally, positive bowel sounds MSK no focal spinal tenderness, no upper extremity lymphedema Neuro: nonfocal, well oriented, appropriate affect Breasts: Deferred   LAB RESULTS:  CMP     Component Value Date/Time   NA 139 11/03/2020 1143   K 4.1  11/03/2020 1143   CL 106 11/03/2020 1143   CO2 24 11/03/2020 1143   GLUCOSE 132 (H) 11/03/2020 1143   BUN 14 11/03/2020 1143   CREATININE 0.81 11/03/2020 1143   CREATININE 0.75 04/24/2020 1035   CALCIUM 9.7 11/03/2020 1143   PROT 7.4 11/03/2020 1143   ALBUMIN 4.3 11/03/2020 1143   AST 28 11/03/2020 1143   AST 23 04/24/2020 1035   ALT 32 11/03/2020 1143   ALT 20 04/24/2020 1035   ALKPHOS 130 (H) 11/03/2020 1143   BILITOT 1.0 11/03/2020 1143   BILITOT 1.1 04/24/2020 1035   GFRNONAA >60 11/03/2020 1143   GFRNONAA >60 04/24/2020 1035   GFRAA >60 04/24/2020 1035    No results found for: TOTALPROTELP, ALBUMINELP, A1GS, A2GS, BETS, BETA2SER, GAMS, MSPIKE, SPEI  No results found for: KPAFRELGTCHN,  LAMBDASER, [facility]  Lab Results  Component Value Date   WBC 5.5 11/03/2020   NEUTROABS 3.9 11/03/2020   HGB 15.2 (H) 11/03/2020   HCT 44.3 11/03/2020   MCV 87.7 11/03/2020   PLT 185 11/03/2020   No results found for: LABCA2  No components found for: PXTGGY694  No results for input(s): INR in the last 168 hours.  No results found for: LABCA2  No results found for: CAN199  No results found for: CAN125  No results found for: WNI627  Lab Results  Component Value Date   CA2729 104.2 (H) 10/29/2020    No components found for: HGQUANT  Lab Results  Component Value Date   CEA1 2.45 10/29/2020   /  CEA ([facility])  Date Value Ref Range Status  10/29/2020 2.45 0.00 - 5.00 ng/mL Final    Comment:    (NOTE) This test was performed using Architect's Chemiluminescent Microparticle Immunoassay. Values obtained from different assay methods cannot be used interchangeably. Please note that 5-10% of patients who smoke may see CEA levels up to 6.9 ng/mL. Performed at [facility], [place] [address], [place], [place] [address]      No results found for: AFPTUMOR  No results found for: [place]  No results found for: HGBA, HGBA2QUANT,  HGBFQUANT, HGBSQUAN (Hemoglobinopathy evaluation)   Lab Results  Component Value Date   LDH 170 09/30/2019    No results found for: IRON, TIBC, IRONPCTSAT (Iron and TIBC)  Lab Results  Component Value Date   FERRITIN 498 (H) 10/19/2019    Urinalysis    Component Value Date/Time   COLORURINE YELLOW 08/28/2019 2326   APPEARANCEUR CLOUDY (A) 08/28/2019 2326   LABSPEC 1.013 08/28/2019 2326   PHURINE 5.0 08/28/2019 2326   GLUCOSEU NEGATIVE 08/28/2019 2326   HGBUR MODERATE (A) 08/28/2019 2326   BILIRUBINUR NEGATIVE 08/28/2019 2326   KETONESUR NEGATIVE 08/28/2019 2326   PROTEINUR NEGATIVE 08/28/2019 2326   NITRITE NEGATIVE 08/28/2019 2326   LEUKOCYTESUR MODERATE (A) 08/28/2019 2326    STUDIES: MR LIVER W WO CONTRAST  Result Date: 10/22/2020 CLINICAL DATA:  Hepatic lesions visualized on chest CT concerning for metastatic disease in the setting of breast cancer. EXAM: MRI ABDOMEN WITHOUT AND WITH CONTRAST TECHNIQUE: Multiplanar multisequence MR imaging of the abdomen was performed both before and after the administration of intravenous contrast. CONTRAST:  35m GADAVIST GADOBUTROL 1 MMOL/ML IV SOLN COMPARISON:  CTA chest October 07, 2020. FINDINGS: Lower chest: Similar scattered subsegmental atelectasis in the bases. Hepatobiliary: There are numerous bilobar peripherally enhancing solid hepatic lesions. For reference: -segment VII lesion which measures 2.0 cm (series 4, image 10) -segment V/VIII 8 lesion which measures 2.5 cm (series 4, image 14). -segment III lesion which measures 6 mm (series 4, image 20) Pancreas: No mass, inflammatory changes, or other parenchymal abnormality identified. Spleen:  Within normal limits in size and appearance. Adrenals/Urinary Tract: No masses identified. No evidence of hydronephrosis. Stomach/Bowel: Visualized portions within the abdomen are unremarkable. Vascular/Lymphatic: No pathologically enlarged lymph nodes identified. No abdominal aortic aneurysm  demonstrated. Other:  No ascites. Musculoskeletal: No suspicious bone lesions identified. Postsurgical change in the right breast and axilla. IMPRESSION: Numerous bilobar peripherally enhancing solid hepatic lesions, consistent with metastatic disease. Electronically Signed   By: JDahlia BailiffMD   On: 10/22/2020 18:37   [place]CORE BIOPSY (LIVER)  Result Date: 11/03/2020 INDICATION: History of breast cancer with liver masses concerning for metastatic disease. EXAM: ULTRASOUND BIOPSY CORE LIVER MEDICATIONS: None. ANESTHESIA/SEDATION: Moderate (conscious)  sedation was employed during this procedure. A total of Versed 2 mg and Fentanyl 100 mcg was administered intravenously. Moderate Sedation Time: 10 minutes. The patient's level of consciousness and vital signs were monitored continuously by radiology nursing throughout the procedure under my direct supervision. FLUOROSCOPY TIME:  None. COMPLICATIONS: None immediate. PROCEDURE: Informed written consent was obtained from the patient after a thorough discussion of the procedural risks, benefits and alternatives. All questions were addressed. Maximal Sterile Barrier Technique was utilized including caps, mask, sterile gowns, sterile gloves, sterile drape, hand hygiene and skin antiseptic. A timeout was performed prior to the initiation of the procedure. Ultrasound was used to interrogate the liver. The background hepatic parenchyma is echogenic and coarsened consistent with hepatic steatosis. Several ill-defined hypoechoic masses are present within the superior aspect of the right hemi liver. A suitable mass was targeted for biopsy. The overlying skin entry site was marked. The skin was then sterilely prepped and draped in the standard fashion using chlorhexidine skin prep. Local anesthesia was attained by infiltration with 1% lidocaine. A small dermatotomy was made. Under real-time ultrasound guidance, a 17 gauge introducer needle was carefully advanced and positioned  at the margin of the mass. Multiple 18 gauge core biopsies were then coaxially obtained using the bio Pince automated biopsy device. Biopsy specimens were placed in formalin and delivered to pathology for further analysis. As the introducer needle was removed, the biopsy tract was embolized with a Gel-Foam slurry. Post biopsy ultrasound imaging demonstrates no evidence of immediate complication. The patient tolerated the procedure well. IMPRESSION: Technically successful ultrasound-guided core biopsy of liver lesion. Electronically Signed   By: Jacqulynn Cadet M.D.   On: 11/03/2020 15:01     ELIGIBLE FOR AVAILABLE RESEARCH PROTOCOL: AET  ASSESSMENT: 50 y.o. [place], [place] woman status post right breast upper outer quadrant biopsy 05/01/2019 for a clinical T1c N1, stage IIA invasive ductal carcinoma, grade 3, estrogen and progesterone receptor positive, HER-2 nonamplified, with an MIB-1-1 of 20%.  (a) mass in the axillary tail was a positive lymph node  (1) MammaPrint obtained from the original biopsy shows a high risk luminal B tumor and predicts a 5-year disease-free survival of 91% in her case  (2) genetics testing 05/09/2019 through the Common Hereditary Gene Panel offered by [facility] found no deleterious mutations in APC, ATM, AXIN2, BARD1, BMPR1A, BRCA1, BRCA2, BRIP1, CDH1, CDK4, CDKN2A (p14ARF), CDKN2A (p16INK4a), CHEK2, CTNNA1, DICER1, EPCAM (Deletion/duplication testing only), GREM1 (promoter region deletion/duplication testing only), KIT, MEN1, MLH1, MSH2, MSH3, MSH6, MUTYH, NBN, NF1, NHTL1, PALB2, PDGFRA, PMS2, POLD1, POLE, PTEN, RAD50, RAD51C, RAD51D, RNF43, SDHB, SDHC, SDHD, SMAD4, SMARCA4. STK11, TP53, TSC1, TSC2, and VHL.  The following genes were evaluated for sequence changes only: SDHA and HOXB13 c.251G>A variant only.   (a) A variant of uncertain significance (VUS) was detected in one of her MSH6 genes (c.831A>C).  (3) status post right lumpectomy and sentinel lymph node sampling  06/12/2019 for a pT2 pN1, stage IIA invasive ductal carcinoma, grade 2, with positive margins  (a) a total of 4 sentinel lymph nodes removed, one positive (with ECE), ine itc  (b) margin clearance 04/19/2019 successful (medial margin close but negative for DCIS)  (4) adjuvant chemotherapy consisting of doxorubicin and cyclophosphamide in dose dense fashion x4 starting 07/10/2019, completed 09/24/2019; planned weekly paclitaxel x12 omitted   (a) echo 06/26/2019 shows an EF of 60-65%  (b echo on 09/19/2019 shows an EF of 60-65%  (c) chemotherapy stopped after four cycles of doxorubicin and cyclophosphamide due to #  5 below  (5) Multiple VTE/PE documented 10/02/2019, on intravenous heparin initially, then Xarelto started 10/19/2019  (a) presented with SVC syndrome and bilateral pulmonary emboli on 09/28/2019  (b) s/p SVC thrombectomy and port removal 93/79/0240 complicated by hemopericardium and tamponade, necessitating pericardial window placement   (c) postop course complicated by wound dehiscence requiring wound VAC  (c) Doppler 10/03/2019 showed right lower extremity DVT  (d) CT angio and Doppler of both upper extremities 10/13/2019 found persistent acute bilateral pulmonary emboli, persistent but improved SVC thrombus, bilateral pleural effusions, and right lower lobe collapse. Bilateral upper extremity DVTs also noted  (e) Dopplers upper extremity 11/19/2019 showed clots cleared on the left, chronic DVT right internal jugular and axillary veins  (f) CT angio of the chest 10/07/2020 shows resolution of earlier pleural effusions and no evidence of active embolism (but see #9 below)  (6) COVID-19 infection documented 09/27/2020, status post remdesivir  (7) adjuvant radiation 12/24/19 - 02/06/20 Site/dose:   The patient initially received a dose of 50.4 Gy in 28 fractions to the breast and SCLV region using a 4-field approach. This was delivered using a 3-D conformal technique. The patient then  received a boost to the seroma. This delivered an additional 10 Gy in 5 fractions using a 3-field photon boost technique. The total dose was 60.4 Gy.  (8) anastrozole started mid May 2021  (a) [facility] and estradiol obtained 11/15/2019 consistent with menopause  (b) not a good candidate for tamoxifen given history of DVT/PE above  (c) bone density 07/31/2020 normal (T score equals 0.1).  METASTATIC DISEASE: Jan 2022 (9) CT angio of the chest 12/28/2021shows new liver lesions (not seen on CT angio January 2021)  (a) MRI 10/22/2020 shows multiple liver lesions, the largest 2.5 cm  (b)   liver biopsy 11/03/2020 shows metastatic carcinoma estrogen receptor 10% positive with weak staining intensity, HER-2 and progesterone receptor negative.  (10) foundation 1 study requested on 11/03/2020 liver biopsy   PLAN:  I showed Jesenia and Veronica [name] the images from the recent liver MRI.  We then went over the biopsy results.  They understand that she now has breast cancer in the liver (not liver cancer).  We do not see cancer in the lungs or bones but we will obtain a bone scan to confirm the latter.  This is now stage IV breast cancer and it is triple negative.  She understands that stage IV breast cancer is not curable with our current knowledge base. The goal of treatment is control. The strategy of treatment is to do only the minimum necessary to control the growth of the tumor so that the patient can have as normal a life as possible. There is no survival advantage in treating aggressively if treating less aggressively results in tumor control. With this strategy stage IV breast cancer in many cases can function as a "chronic illness": something that cannot be quite gotten rid of but can be controlled for an indefinite period of time  She understands that our choices in triple negative breast cancer are limited.  They consist chiefly of chemotherapy and immunotherapy.  We are going to start with paclitaxel weekly.   She will receive this on days 1 and day 8 of each 21-day cycle.  She will also receive pembrolizumab/Keytruda.  She receives this on day 1 of each 21-day cycle.  Today we discussed the possible toxicities side effects and complications of these agents in detail.  I have entered the appropriate orders.  When she sees  me 11/19/2020 to start her chemo we will also check a CA 27-29.  We also requested a foundation 1 study on the liver biopsy.  That may give [place] additional targets.  I am hoping to be able to start 11/19/2020.  After 4 cycles, 12weeks, she will have a repeat liver MRI to assess response.  Total encounter time 65 minutes.Sarajane Jews C. Loella Hickle, MD Medical Oncology and Hematology [facility] [place], [place] [address] Tel. [phone]    Fax. [phone]   I, Wilburn Mylar, am acting as scribe for Dr. Virgie Dad. Emie Sommerfeld.  I, Lurline Del MD, have reviewed the above documentation for accuracy and completeness, and I agree with the above.   *Total Encounter Time as defined by the Centers for Medicare and Medicaid Services includes, in addition to the face-to-face time of a patient visit (documented in the note above) non-face-to-face time: obtaining and reviewing outside history, ordering and reviewing medications, tests or procedures, care coordination (communications with other health care professionals or caregivers) and documentation in the medical record.

## 2020-11-17 ENCOUNTER — Other Ambulatory Visit: Payer: Self-pay | Admitting: Oncology

## 2020-11-17 ENCOUNTER — Other Ambulatory Visit: Payer: Self-pay | Admitting: Pharmacist

## 2020-11-18 ENCOUNTER — Telehealth: Payer: Self-pay | Admitting: *Deleted

## 2020-11-18 ENCOUNTER — Telehealth: Payer: Self-pay | Admitting: Oncology

## 2020-11-18 NOTE — Telephone Encounter (Signed)
Release: [id number] Faxed medical records(for referral) to [place] @ fax [phone]

## 2020-11-18 NOTE — Progress Notes (Signed)
Pharmacist Chemotherapy Monitoring - Initial Assessment    Anticipated start date: 11/20/20  Regimen:  . Are orders appropriate based on the patient's diagnosis, regimen, and cycle? Yes . Does the plan date match the patient's scheduled date? Yes . Is the sequencing of drugs appropriate? Yes . Are the premedications appropriate for the patient's regimen? Yes . Prior Authorization for treatment is: Approved o If applicable, is the correct biosimilar selected based on the patient's insurance? not applicable  Organ Function and Labs: Marland Kitchen Are dose adjustments needed based on the patient's renal function, hepatic function, or hematologic function? Yes . Are appropriate labs ordered prior to the start of patient's treatment? Yes . Other organ system assessment, if indicated: N/A . The following baseline labs, if indicated, have been ordered: N/A  Dose Assessment: . Are the drug doses appropriate? Yes . Are the following correct: o Drug concentrations Yes o IV fluid compatible with drug Yes o Administration routes Yes o Timing of therapy Yes . If applicable, does the patient have documented access for treatment and/or plans for port-a-cath placement? no . If applicable, have lifetime cumulative doses been properly documented and assessed? yes Lifetime Dose Tracking  . Doxorubicin: 230.807 mg/m2 (478 mg) = 51.29 % of the maximum lifetime dose of 450 mg/m2  o   Toxicity Monitoring/Prevention: . The patient has the following take home antiemetics prescribed: Prochlorperazine . The patient has the following take home medications prescribed: N/A . Medication allergies and previous infusion related reactions, if applicable, have been reviewed and addressed. Yes . The patient's current medication list has been assessed for drug-drug interactions with their chemotherapy regimen. no significant drug-drug interactions were identified on review.  Order Review: . Are the treatment plan orders signed?  Yes . Is the patient scheduled to see a provider prior to their treatment? Yes  I verify that I have reviewed each item in the above checklist and answered each question accordingly.  @RPHNAME @

## 2020-11-18 NOTE — Telephone Encounter (Signed)
Pt will need port for chemotherapy.  She will start treatment for weekly taxol with [place] every 28 days  peripherally on 11/19/2020.  This note will be sent to her surgeon for possible scheduling - and if unable will then arrange with IR.

## 2020-11-18 NOTE — Telephone Encounter (Signed)
Foundation 1 study request faxed to [facility] pathology,.

## 2020-11-18 NOTE — Progress Notes (Signed)
[place]  [phone] [phone]     ID: Veronica [name] DOB: 26-Sep-1971  MR#: [id number]  DXI#:[id number]  Patient Care Team: Jamie Kato as PCP - General (Family Medicine) Mauro Kaufmann, RN as Oncology Nurse Navigator [name], RN as Oncology Nurse Navigator Erroll Luna, MD as Consulting Physician (General Surgery) Roxane Puerto, Virgie Dad, MD as Consulting Physician (Oncology) Kyung Rudd, MD as Consulting Physician (Radiation Oncology) Gaye Pollack, MD as Consulting Physician (Cardiothoracic Surgery) Serafina Mitchell, MD as Consulting Physician (Vascular Surgery) Chauncey Cruel, MD OTHER MD:  CHIEF COMPLAINT: Estrogen receptor positive breast cancer  CURRENT TREATMENT: Paclitaxel day 1 and day 8 of each 21-day cycle   INTERVAL HISTORY: [name] returns today for follow-up of her estrogen receptor positive breast cancer   She is scheduled to receive paclitaxel, given on days 1 and 8, every 21 days starting today.  We had also written for Keytruda to be given concomitantly but this was not approved by her insurance.  She had started started anastrozole May 2021.  She tolerated this well.  This is being held during chemotherapy.  To complete restaging she has a bone scan and noncontrast head CT pending.  We are also waiting on port placement  Her most recent bone density screening on 08/01/2020 showed a T-score of +0.1, which is normal.  Lab Results  Component Value Date   CA2729 104.2 (H) 10/29/2020    REVIEW OF SYSTEMS: Veronica [name] has very minimal symptoms related to her cancer.  Occasionally she has a little bit of right upper quadrant discomfort.  Rarely she has a midsternal localized pain.  She has a normal functional status for herself and has enjoyed going to see her nephew's basketball games.  Otherwise she pretty much stays home to avoid infection.   COVID 19 VACCINATION STATUS: refuses vaccination, had COVID 29 September 2019   HISTORY OF CURRENT ILLNESS: From the original intake note:  Veronica [name] had routine screening mammography on 04/27/2019 showing a possible abnormality in the right breast. She underwent right diagnostic mammography with tomography and right breast ultrasonography at [facility] on 05/01/2019 showing: breast density category C; highly suspicious 1.7 cm irregular mass in the right breast at 10 o'clock, 8 cm from the nipple; indeterminate hypoechoic round mass in the right axillary tail/low axilla. Physical exam showed no suspicious lumps.  Accordingly on 05/02/2019 she proceeded to biopsy of the right breast area in question. The pathology from this procedure ([id number]) showed: invasive ductal carcinoma, grade 3; ductal carcinoma in situ; lymphovascular invasion present. Prognostic indicators significant for: estrogen receptor, 30% positive with moderate staining intensity, and progesterone receptor, 30% positive with strong staining intensity. Proliferation marker Ki67 at 20%. HER2 negative by immunohistochemistry (1+).  The second "satellite" lesion was a lymph node which was also positive.  The patient's subsequent history is as detailed below.   PAST MEDICAL HISTORY: Past Medical History:  Diagnosis Date  . Cancer ([place])   . Chronic low back pain with left-sided sciatica   . Family history of leukemia   . Family history of stomach cancer   . Hypertension   . Personal history of chemotherapy   . Personal history of radiation therapy    finished june '21    PAST SURGICAL HISTORY: Past Surgical History:  Procedure Laterality Date  . APPLICATION OF WOUND VAC N/A 10/16/2019   Procedure: APPLICATION OF WOUND VAC;  Surgeon: Gaye Pollack, MD;  Location:  [facility];  Service: Vascular;  Laterality: N/A;  . BREAST BIOPSY Right 05/02/2019   right clips X2  . BREAST LUMPECTOMY Right    06/2019  . BREAST LUMPECTOMY WITH RADIOACTIVE SEED AND SENTINEL LYMPH NODE BIOPSY  Right 06/12/2019   Procedure: RIGHT BREAST RADIOACTIVE SEED LUMPECTOMY  AND RIGHT AXILLARY SEED TARGETED LYMPH NODE AND AXILLARY SENTINEL LYMPH NODE MAPPING;  Surgeon: Erroll Luna, MD;  Location: [place];  Service: General;  Laterality: Right;  PEC BLOCK  . C/S x2  '98 '03  . CENTRAL VENOUS CATHETER INSERTION  10/02/2019   Procedure: Insertion Central Line Adult;  Surgeon: Serafina Mitchell, MD;  Location: [place];  Service: Vascular;;  . CESAREAN SECTION    . CHOLECYSTECTOMY    . I & D EXTREMITY N/A 10/16/2019   Procedure: DEBRIDEMENT of DEHISCED MIDLINE SURGICAL INCISION;  Surgeon: Gaye Pollack, MD;  Location: [place] [facility];  Service: Vascular;  Laterality: N/A;  . LAPAROSCOPIC ASSISTED VAGINAL HYSTERECTOMY  05/17/2011   Procedure: LAPAROSCOPIC ASSISTED VAGINAL HYSTERECTOMY;  Surgeon: Sharene Butters;  Location: [facility];  Service: Gynecology;  Laterality: N/A;  Laparoscopic Assisted Vaginal Hysterectomy With Lysis Of Adhesions  . PERICARDIAL WINDOW N/A 10/02/2019   Procedure: Pericardial Window;  Surgeon: Serafina Mitchell, MD;  Location: [place];  Service: Vascular;  Laterality: N/A;  . PORT-A-CATH REMOVAL  10/02/2019   Procedure: Removal Port-A-Cath;  Surgeon: Serafina Mitchell, MD;  Location: [place];  Service: Vascular;;  . PORTACATH PLACEMENT Right 06/12/2019   Procedure: INSERTION PORT-A-CATH WITH ULTRASOUND;  Surgeon: Erroll Luna, MD;  Location: [place];  Service: General;  Laterality: Right;  . RE-EXCISION OF BREAST LUMPECTOMY Right 07/17/2019   Procedure: RE-EXCISION OF RIGHT BREAST LUMPECTOMY;  Surgeon: Erroll Luna, MD;  Location: [place];  Service: General;  Laterality: Right;  . TUBAL LIGATION    . ULTRASOUND GUIDANCE FOR VASCULAR ACCESS  10/02/2019   Procedure: Ultrasound Guidance For Vascular Access;  Surgeon: Serafina Mitchell, MD;  Location: [facility] OR;  Service: Vascular;;  . VENOGRAM N/A 10/02/2019   Procedure: Central VENOGRAM;   Surgeon: Serafina Mitchell, MD;  Location: [facility];  Service: Vascular;  Laterality: N/A;    FAMILY HISTORY: Family History  Problem Relation Age of Onset  . Arthritis Mother   . COPD Mother   . Hypertension Mother   . Hyperlipidemia Mother   . Leukemia Brother 61  . Stomach cancer Maternal Grandmother        late 62s  Patient's parents are living (as of September 2020). Her father is 31 and her mother is 28 as of 04/2019. The patient denies a family hx of breast or ovarian cancer. She has 7 siblings, 6 brothers and 1 sister. She reports her maternal grandmother was diagnosed with stomach cancer at age 58. Her brother was diagnosed with leukemia at age 18.   GYNECOLOGIC HISTORY:  No LMP recorded. Patient has had a hysterectomy. Menarche: 50 years old Age at first live birth: 50 years old GX P 2 (+1 stillborn) LMP age 91-42 Contraceptive: unsure, maybe for a year at age 13. HRT no Hysterectomy? Yes, 05/2011 BSO? 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 She is married. Husband Sheppard Plumber") is a Hydrologist for a telephone company. She lives at home with her husband and two daughters. Daughter Suszanne Conners, age 22, is a Scientist, water quality. Daughter Jarrett Soho, age 3, is a Ship broker.  ADVANCED DIRECTIVES: Husband Heath Lark is her HCPOA.   HEALTH MAINTENANCE: Social History   Tobacco Use  . Smoking status: Never Smoker  . Smokeless tobacco: Never Used  Vaping Use  . Vaping Use: Never used  Substance Use Topics  . Alcohol use: Yes    Alcohol/week: 2.0 standard drinks    Types: 2 Standard drinks or equivalent per week    Comment: "2-3 a week"  . Drug use: No     Colonoscopy: n/a  PAP: 02/2018  Bone density: n/a   No Known Allergies  Current Outpatient Medications  Medication Sig Dispense Refill  . Cholecalciferol (VITAMIN D3) 25 MCG (1000 UT) CHEW Chew 1 tablet by mouth daily.     Marland Kitchen gabapentin (NEURONTIN) 300 MG capsule Take 1 capsule  (300 mg total) by mouth at bedtime as needed. 90 capsule 4  . metoprolol tartrate (LOPRESSOR) 25 MG tablet Take 1 tablet (25 mg total) by mouth 2 (two) times daily. 60 tablet 0  . ondansetron (ZOFRAN) 8 MG tablet Take 1 tablet (8 mg total) by mouth 2 (two) times daily as needed (Nausea or vomiting). 30 tablet 1  . prochlorperazine (COMPAZINE) 10 MG tablet Take 1 tablet (10 mg total) by mouth every 6 (six) hours as needed (Nausea or vomiting). 90 tablet 1  . XARELTO 20 MG TABS tablet TAKE 1 TABLET BY MOUTH  DAILY WITH SUPPER 90 tablet 3   No current facility-administered medications for this visit.    OBJECTIVE: white woman who appears stated age  89:   11/19/20 0806  BP: 119/60  Pulse: 70  Resp: 16  Temp: 97.7 F (36.5 C)  SpO2: 100%   Wt Readings from Last 3 Encounters:  11/19/20 215 lb 6.4 oz (97.7 kg)  11/12/20 216 lb 8 oz (98.2 kg)  08/05/20 214 lb 4.8 oz (97.2 kg)   Body mass index is 40.7 kg/m.    ECOG FS:1 - Symptomatic but completely ambulatory  Sclerae unicteric, EOMs intact Wearing a mask No cervical or supraclavicular adenopathy Lungs no rales or rhonchi Heart regular rate and rhythm Abd soft, nontender, positive bowel sounds MSK no focal spinal tenderness, no upper extremity lymphedema Neuro: nonfocal, well oriented, appropriate affect Breasts: The right breast is status post lumpectomy and radiation.  There is no evidence of local recurrence.  Left breast is benign.  Both axillae are benign   LAB RESULTS:  CMP     Component Value Date/Time   NA 141 11/19/2020 0749   K 4.3 11/19/2020 0749   CL 107 11/19/2020 0749   CO2 25 11/19/2020 0749   GLUCOSE 143 (H) 11/19/2020 0749   BUN 10 11/19/2020 0749   CREATININE 0.80 11/19/2020 0749   CREATININE 0.75 04/24/2020 1035   CALCIUM 9.6 11/19/2020 0749   PROT 7.0 11/19/2020 0749   ALBUMIN 3.9 11/19/2020 0749   AST 36 11/19/2020 0749   AST 23 04/24/2020 1035   ALT 51 (H) 11/19/2020 0749   ALT 20  04/24/2020 1035   ALKPHOS 137 (H) 11/19/2020 0749   BILITOT 1.2 11/19/2020 0749   BILITOT 1.1 04/24/2020 1035   GFRNONAA >60 11/19/2020 0749   GFRNONAA >60 04/24/2020 1035   GFRAA >60 04/24/2020 1035    No results found for: TOTALPROTELP, ALBUMINELP, A1GS, A2GS, BETS, BETA2SER, GAMS, MSPIKE, SPEI  No results found for: KPAFRELGTCHN, LAMBDASER, KAPLAMBRATIO  Lab Results  Component Value Date   WBC 4.9 11/19/2020   NEUTROABS 3.2 11/19/2020   HGB 14.5 11/19/2020   HCT 42.9  11/19/2020   MCV 85.6 11/19/2020   PLT 204 11/19/2020   No results found for: LABCA2  No components found for: AJOINO676  No results for input(s): INR in the last 168 hours.  No results found for: LABCA2  No results found for: CAN199  No results found for: CAN125  No results found for: HMC947  Lab Results  Component Value Date   CA2729 104.2 (H) 10/29/2020    No components found for: HGQUANT  Lab Results  Component Value Date   CEA1 2.45 10/29/2020   /  CEA ([facility])  Date Value Ref Range Status  10/29/2020 2.45 0.00 - 5.00 ng/mL Final    Comment:    (NOTE) This test was performed using Architect's Chemiluminescent Microparticle Immunoassay. Values obtained from different assay methods cannot be used interchangeably. Please note that 5-10% of patients who smoke may see CEA levels up to 6.9 ng/mL. Performed at [facility], [place] [address] [place], [place] [address]      No results found for: AFPTUMOR  No results found for: [place]  No results found for: HGBA, HGBA2QUANT, HGBFQUANT, HGBSQUAN (Hemoglobinopathy evaluation)   Lab Results  Component Value Date   LDH 170 09/30/2019    No results found for: IRON, TIBC, IRONPCTSAT (Iron and TIBC)  Lab Results  Component Value Date   FERRITIN 498 (H) 10/19/2019    Urinalysis    Component Value Date/Time   COLORURINE YELLOW 08/28/2019 2326   APPEARANCEUR CLOUDY (A) 08/28/2019 2326   LABSPEC  1.013 08/28/2019 2326   PHURINE 5.0 08/28/2019 2326   GLUCOSEU NEGATIVE 08/28/2019 2326   HGBUR MODERATE (A) 08/28/2019 2326   BILIRUBINUR NEGATIVE 08/28/2019 2326   KETONESUR NEGATIVE 08/28/2019 2326   PROTEINUR NEGATIVE 08/28/2019 2326   NITRITE NEGATIVE 08/28/2019 2326   LEUKOCYTESUR MODERATE (A) 08/28/2019 2326    STUDIES: MR LIVER W WO CONTRAST  Result Date: 10/22/2020 CLINICAL DATA:  Hepatic lesions visualized on chest CT concerning for metastatic disease in the setting of breast cancer. EXAM: MRI ABDOMEN WITHOUT AND WITH CONTRAST TECHNIQUE: Multiplanar multisequence MR imaging of the abdomen was performed both before and after the administration of intravenous contrast. CONTRAST:  17m GADAVIST GADOBUTROL 1 MMOL/ML IV SOLN COMPARISON:  CTA chest October 07, 2020. FINDINGS: Lower chest: Similar scattered subsegmental atelectasis in the bases. Hepatobiliary: There are numerous bilobar peripherally enhancing solid hepatic lesions. For reference: -segment VII lesion which measures 2.0 cm (series 4, image 10) -segment V/VIII 8 lesion which measures 2.5 cm (series 4, image 14). -segment III lesion which measures 6 mm (series 4, image 20) Pancreas: No mass, inflammatory changes, or other parenchymal abnormality identified. Spleen:  Within normal limits in size and appearance. Adrenals/Urinary Tract: No masses identified. No evidence of hydronephrosis. Stomach/Bowel: Visualized portions within the abdomen are unremarkable. Vascular/Lymphatic: No pathologically enlarged lymph nodes identified. No abdominal aortic aneurysm demonstrated. Other:  No ascites. Musculoskeletal: No suspicious bone lesions identified. Postsurgical change in the right breast and axilla. IMPRESSION: Numerous bilobar peripherally enhancing solid hepatic lesions, consistent with metastatic disease. Electronically Signed   By: JDahlia BailiffMD   On: 10/22/2020 18:37   UKoreaCORE BIOPSY (LIVER)  Result Date: 11/03/2020 INDICATION:  History of breast cancer with liver masses concerning for metastatic disease. EXAM: ULTRASOUND BIOPSY CORE LIVER MEDICATIONS: None. ANESTHESIA/SEDATION: Moderate (conscious) sedation was employed during this procedure. A total of Versed 2 mg and Fentanyl 100 mcg was administered intravenously. Moderate Sedation Time: 10 minutes. The patient's level of consciousness  and vital signs were monitored continuously by radiology nursing throughout the procedure under my direct supervision. FLUOROSCOPY TIME:  None. COMPLICATIONS: None immediate. PROCEDURE: Informed written consent was obtained from the patient after a thorough discussion of the procedural risks, benefits and alternatives. All questions were addressed. Maximal Sterile Barrier Technique was utilized including caps, mask, sterile gowns, sterile gloves, sterile drape, hand hygiene and skin antiseptic. A timeout was performed prior to the initiation of the procedure. Ultrasound was used to interrogate the liver. The background hepatic parenchyma is echogenic and coarsened consistent with hepatic steatosis. Several ill-defined hypoechoic masses are present within the superior aspect of the right hemi liver. A suitable mass was targeted for biopsy. The overlying skin entry site was marked. The skin was then sterilely prepped and draped in the standard fashion using chlorhexidine skin prep. Local anesthesia was attained by infiltration with 1% lidocaine. A small dermatotomy was made. Under real-time ultrasound guidance, a 17 gauge introducer needle was carefully advanced and positioned at the margin of the mass. Multiple 18 gauge core biopsies were then coaxially obtained using the bio Pince automated biopsy device. Biopsy specimens were placed in formalin and delivered to pathology for further analysis. As the introducer needle was removed, the biopsy tract was embolized with a Gel-Foam slurry. Post biopsy ultrasound imaging demonstrates no evidence of immediate  complication. The patient tolerated the procedure well. IMPRESSION: Technically successful ultrasound-guided core biopsy of liver lesion. Electronically Signed   By: Jacqulynn Cadet M.D.   On: 11/03/2020 15:01     ELIGIBLE FOR AVAILABLE RESEARCH PROTOCOL: AET  ASSESSMENT: 50 y.o. [place], [place] woman status post right breast upper outer quadrant biopsy 05/01/2019 for a clinical T1c N1, stage IIA invasive ductal carcinoma, grade 3, estrogen and progesterone receptor positive, HER-2 nonamplified, with an MIB-1-1 of 20%.  (a) mass in the axillary tail was a positive lymph node  (1) MammaPrint obtained from the original biopsy shows a high risk luminal B tumor and predicts a 5-year disease-free survival of 91% in her case  (2) genetics testing 05/09/2019 through the Common Hereditary Gene Panel offered by [facility] found no deleterious mutations in APC, ATM, AXIN2, BARD1, BMPR1A, BRCA1, BRCA2, BRIP1, CDH1, CDK4, CDKN2A (p14ARF), CDKN2A (p16INK4a), CHEK2, CTNNA1, DICER1, EPCAM (Deletion/duplication testing only), GREM1 (promoter region deletion/duplication testing only), KIT, MEN1, MLH1, MSH2, MSH3, MSH6, MUTYH, NBN, NF1, NHTL1, PALB2, PDGFRA, PMS2, POLD1, POLE, PTEN, RAD50, RAD51C, RAD51D, RNF43, SDHB, SDHC, SDHD, SMAD4, SMARCA4. STK11, TP53, TSC1, TSC2, and VHL.  The following genes were evaluated for sequence changes only: SDHA and HOXB13 c.251G>A variant only.   (a) A variant of uncertain significance (VUS) was detected in one of her MSH6 genes (c.831A>C).  (3) status post right lumpectomy and sentinel lymph node sampling 06/12/2019 for a pT2 pN1, stage IIA invasive ductal carcinoma, grade 2, with positive margins  (a) a total of 4 sentinel lymph nodes removed, one positive (with ECE), ine itc  (b) margin clearance 04/19/2019 successful (medial margin close but negative for DCIS)  (4) adjuvant chemotherapy consisting of doxorubicin and cyclophosphamide in dose dense fashion x4 starting 07/10/2019,  completed 09/24/2019; planned weekly paclitaxel x12 omitted   (a) echo 06/26/2019 shows an EF of 60-65%  (b echo on 09/19/2019 shows an EF of 60-65%  (c) chemotherapy stopped after four cycles of doxorubicin and cyclophosphamide due to #5 below  (5) Multiple VTE/PE documented 10/02/2019, on intravenous heparin initially, then Xarelto started 10/19/2019  (a) presented with SVC syndrome and bilateral pulmonary emboli on 09/28/2019  (  b) s/p SVC thrombectomy and port removal 91/47/8295 complicated by hemopericardium and tamponade, necessitating pericardial window placement   (c) postop course complicated by wound dehiscence requiring wound VAC  (c) Doppler 10/03/2019 showed right lower extremity DVT  (d) CT angio and Doppler of both upper extremities 10/13/2019 found persistent acute bilateral pulmonary emboli, persistent but improved SVC thrombus, bilateral pleural effusions, and right lower lobe collapse. Bilateral upper extremity DVTs also noted  (e) Dopplers upper extremity 11/19/2019 showed clots cleared on the left, chronic DVT right internal jugular and axillary veins  (f) CT angio of the chest 10/07/2020 shows resolution of earlier pleural effusions and no evidence of active embolism (but see #9 below)  (6) COVID-19 infection documented 09/27/2020, status post remdesivir  (7) adjuvant radiation 12/24/19 - 02/06/20 Site/dose:   The patient initially received a dose of 50.4 Gy in 28 fractions to the breast and SCLV region using a 4-field approach. This was delivered using a 3-D conformal technique. The patient then received a boost to the seroma. This delivered an additional 10 Gy in 5 fractions using a 3-field photon boost technique. The total dose was 60.4 Gy.  (8) anastrozole started mid May 2021  (a) [facility] and estradiol obtained 11/15/2019 consistent with menopause  (b) not a good candidate for tamoxifen given history of DVT/PE above  (c) bone density 07/31/2020 normal (T score equals  0.1).  METASTATIC DISEASE: Jan 2022 (9) CT angio of the chest 12/28/2021shows new liver lesions (not seen on CT angio January 2021)  (a) MRI 10/22/2020 shows multiple liver lesions, the largest 2.5 cm  (b)   liver biopsy 11/03/2020 shows metastatic carcinoma estrogen receptor 10% positive with weak staining intensity, HER-2 and progesterone receptor negative.  (c) bone scan  (d) non-contrast head CT  (e) CA 27-29 is informative (was 104.1 on 10/29/2020)  (10) foundation 1 study requested on 11/03/2020 liver biopsy  (11) to start paclitaxel day 1 day 8 of every 21-day cycle, starting 11/19/2020   PLAN:  Veronica [name] is ready to start her chemotherapy treatment.  She has a good understanding of the possible toxicities side effects and complications of the relevant agents and how to take the supportive meds.  Unfortunately we are so full here that she will have to have her first treatment in our [facility] office.  She knows how to get there and I reassured her that they do just as good a job there as we do here.  Her next treatment on all subsequent treatments however will be here.  She has an appointment at [facility] for second opinion and I gave her a copy of her records to share in case they have difficulty managing through the epic system to my notes.  We were not able to obtain Keytruda through her insurance.  We will see if we are able to obtain that through courtesy of the company.  She will see me next week with her day 8 treatment and at that time we will troubleshoot any issues that she may have developed from this treatment today.  She knows to call for any other issues that may develop before then  Total encounter time 35 minutes.Sarajane Jews C. Rashiya Lofland, MD Medical Oncology and Hematology [facility] [place], [place] [address] Tel. [phone]    Fax. [phone]   I, Wilburn Mylar, am acting as scribe for Dr. Virgie Dad. Natelie Ostrosky.  Lindie Spruce MD, have reviewed the above documentation for accuracy  and completeness, and I agree with the above.   *Total Encounter Time as defined by the Centers for Medicare and Medicaid Services includes, in addition to the face-to-face time of a patient visit (documented in the note above) non-face-to-face time: obtaining and reviewing outside history, ordering and reviewing medications, tests or procedures, care coordination (communications with other health care professionals or caregivers) and documentation in the medical record.

## 2020-11-19 ENCOUNTER — Inpatient Hospital Stay: Payer: 59

## 2020-11-19 ENCOUNTER — Other Ambulatory Visit: Payer: Self-pay

## 2020-11-19 ENCOUNTER — Inpatient Hospital Stay: Payer: 59 | Admitting: Oncology

## 2020-11-19 VITALS — BP 119/60 | HR 70 | Temp 97.7°F | Resp 16 | Ht 61.0 in | Wt 215.4 lb

## 2020-11-19 DIAGNOSIS — Z170 Estrogen receptor positive status [ER+]: Secondary | ICD-10-CM | POA: Diagnosis not present

## 2020-11-19 DIAGNOSIS — C50411 Malignant neoplasm of upper-outer quadrant of right female breast: Secondary | ICD-10-CM

## 2020-11-19 DIAGNOSIS — C787 Secondary malignant neoplasm of liver and intrahepatic bile duct: Secondary | ICD-10-CM

## 2020-11-19 DIAGNOSIS — Z7189 Other specified counseling: Secondary | ICD-10-CM | POA: Diagnosis not present

## 2020-11-19 LAB — CBC WITH DIFFERENTIAL/PLATELET
Abs Immature Granulocytes: 0.05 10*3/uL (ref 0.00–0.07)
Basophils Absolute: 0 10*3/uL (ref 0.0–0.1)
Basophils Relative: 0 %
Eosinophils Absolute: 0.1 10*3/uL (ref 0.0–0.5)
Eosinophils Relative: 2 %
HCT: 42.9 % (ref 36.0–46.0)
Hemoglobin: 14.5 g/dL (ref 12.0–15.0)
Immature Granulocytes: 1 %
Lymphocytes Relative: 22 %
Lymphs Abs: 1.1 10*3/uL (ref 0.7–4.0)
MCH: 28.9 pg (ref 26.0–34.0)
MCHC: 33.8 g/dL (ref 30.0–36.0)
MCV: 85.6 fL (ref 80.0–100.0)
Monocytes Absolute: 0.4 10*3/uL (ref 0.1–1.0)
Monocytes Relative: 9 %
Neutro Abs: 3.2 10*3/uL (ref 1.7–7.7)
Neutrophils Relative %: 66 %
Platelets: 204 10*3/uL (ref 150–400)
RBC: 5.01 MIL/uL (ref 3.87–5.11)
RDW: 11.9 % (ref 11.5–15.5)
WBC: 4.9 10*3/uL (ref 4.0–10.5)
nRBC: 0 % (ref 0.0–0.2)

## 2020-11-19 LAB — COMPREHENSIVE METABOLIC PANEL
ALT: 51 U/L — ABNORMAL HIGH (ref 0–44)
AST: 36 U/L (ref 15–41)
Albumin: 3.9 g/dL (ref 3.5–5.0)
Alkaline Phosphatase: 137 U/L — ABNORMAL HIGH (ref 38–126)
Anion gap: 9 (ref 5–15)
BUN: 10 mg/dL (ref 6–20)
CO2: 25 mmol/L (ref 22–32)
Calcium: 9.6 mg/dL (ref 8.9–10.3)
Chloride: 107 mmol/L (ref 98–111)
Creatinine, Ser: 0.8 mg/dL (ref 0.44–1.00)
GFR, Estimated: >60 mL/min (ref >60)
Glucose, Bld: 143 mg/dL — ABNORMAL HIGH (ref 70–99)
Potassium: 4.3 mmol/L (ref 3.5–5.1)
Sodium: 141 mmol/L (ref 135–145)
Total Bilirubin: 1.2 mg/dL (ref 0.3–1.2)
Total Protein: 7 g/dL (ref 6.5–8.1)

## 2020-11-19 LAB — TSH: TSH: 1.924 u[IU]/mL (ref 0.308–3.960)

## 2020-11-20 ENCOUNTER — Other Ambulatory Visit: Payer: Self-pay | Admitting: Oncology

## 2020-11-20 ENCOUNTER — Other Ambulatory Visit: Payer: 59

## 2020-11-20 ENCOUNTER — Telehealth: Payer: Self-pay | Admitting: Oncology

## 2020-11-20 ENCOUNTER — Ambulatory Visit: Payer: 59

## 2020-11-20 ENCOUNTER — Inpatient Hospital Stay: Payer: 59

## 2020-11-20 VITALS — BP 119/77 | HR 73 | Temp 98.6°F | Resp 16

## 2020-11-20 DIAGNOSIS — Z170 Estrogen receptor positive status [ER+]: Secondary | ICD-10-CM

## 2020-11-20 DIAGNOSIS — C50411 Malignant neoplasm of upper-outer quadrant of right female breast: Secondary | ICD-10-CM

## 2020-11-20 DIAGNOSIS — C787 Secondary malignant neoplasm of liver and intrahepatic bile duct: Secondary | ICD-10-CM | POA: Diagnosis not present

## 2020-11-20 LAB — CANCER ANTIGEN 27.29: CA 27.29: 73.9 U/mL — ABNORMAL HIGH (ref 0.0–38.6)

## 2020-11-20 MED ORDER — DIPHENHYDRAMINE HCL 50 MG/ML IJ SOLN
INTRAMUSCULAR | Status: AC
  Filled 2020-11-20: qty 1

## 2020-11-20 MED ORDER — FAMOTIDINE IN NACL 20-0.9 MG/50ML-% IV SOLN
20.0000 mg | Freq: Once | INTRAVENOUS | Status: AC
  Administered 2020-11-20: 20 mg via INTRAVENOUS

## 2020-11-20 MED ORDER — DIPHENHYDRAMINE HCL 50 MG/ML IJ SOLN
25.0000 mg | Freq: Once | INTRAMUSCULAR | Status: AC
  Administered 2020-11-20: 25 mg via INTRAVENOUS

## 2020-11-20 MED ORDER — FAMOTIDINE IN NACL 20-0.9 MG/50ML-% IV SOLN
INTRAVENOUS | Status: AC
  Filled 2020-11-20: qty 50

## 2020-11-20 MED ORDER — SODIUM CHLORIDE 0.9 % IV SOLN
90.0000 mg/m2 | Freq: Once | INTRAVENOUS | Status: AC
  Administered 2020-11-20: 186 mg via INTRAVENOUS
  Filled 2020-11-20: qty 31

## 2020-11-20 MED ORDER — SODIUM CHLORIDE 0.9 % IV SOLN
Freq: Once | INTRAVENOUS | Status: AC
  Filled 2020-11-20: qty 250

## 2020-11-20 MED ORDER — SODIUM CHLORIDE 0.9 % IV SOLN
20.0000 mg | Freq: Once | INTRAVENOUS | Status: AC
  Administered 2020-11-20: 20 mg via INTRAVENOUS
  Filled 2020-11-20: qty 20

## 2020-11-20 NOTE — Patient Instructions (Signed)
Dexamethasone injection What is this medicine? DEXAMETHASONE (dex a METH a sone) is a corticosteroid. It is used to treat inflammation of the skin, joints, lungs, and other organs. Common conditions treated include asthma, allergies, and arthritis. It is also used for other conditions, like blood disorders and diseases of the adrenal glands. This medicine may be used for other purposes; ask your health care provider or pharmacist if you have questions. COMMON BRAND NAME(S): Decadron, DoubleDex, ReadySharp Dexamethasone, Simplist Dexamethasone, Solurex What should I tell my health care provider before I take this medicine? They need to know if you have any of these conditions:  Cushing's syndrome  diabetes  glaucoma  heart disease  high blood pressure  infection like herpes, measles, tuberculosis, or chickenpox  kidney disease  liver disease  mental illness  myasthenia gravis  osteoporosis  previous heart attack  seizures  stomach or intestine problems  thyroid disease  an unusual or allergic reaction to dexamethasone, corticosteroids, other medicines, lactose, foods, dyes, or preservatives  pregnant or trying to get pregnant  breast-feeding How should I use this medicine? This medicine is for injection into a muscle, joint, lesion, soft tissue, or vein. It is given by a health care professional in a hospital or clinic setting. Talk to your pediatrician regarding the use of this medicine in children. Special care may be needed. Overdosage: If you think you have taken too much of this medicine contact a poison control center or emergency room at once. NOTE: This medicine is only for you. Do not share this medicine with others. What if I miss a dose? This may not apply. If you are having a series of injections over a prolonged period, try not to miss an appointment. Call your doctor or health care professional to reschedule if you are unable to keep an appointment. What  may interact with this medicine? Do not take this medicine with any of the following medications:  live virus vaccines This medicine may also interact with the following medications:  aminoglutethimide  amphotericin B  aspirin and aspirin-like medicines  certain antibiotics like erythromycin, clarithromycin, and troleandomycin  certain antivirals for HIV or hepatitis  certain medicines for seizures like carbamazepine, phenobarbital, phenytoin  certain medicines to treat myasthenia gravis  cholestyramine  cyclosporine  digoxin  diuretics  ephedrine  female hormones, like estrogen or progestins and birth control pills  insulin or other medicines for diabetes  isoniazid  ketoconazole  medicines that relax muscles for surgery  mifepristone  NSAIDs, medicines for pain and inflammation, like ibuprofen or naproxen  rifampin  skin tests for allergies  thalidomide  vaccines  warfarin This list may not describe all possible interactions. Give your health care provider a list of all the medicines, herbs, non-prescription drugs, or dietary supplements you use. Also tell them if you smoke, drink alcohol, or use illegal drugs. Some items may interact with your medicine. What should I watch for while using this medicine? Visit your health care professional for regular checks on your progress. Tell your health care professional if your symptoms do not start to get better or if they get worse. Your condition will be monitored carefully while you are receiving this medicine. Wear a medical ID bracelet or chain. Carry a card that describes your disease and details of your medicine and dosage times. This medicine may increase your risk of getting an infection. Call your health care professional for advice if you get a fever, chills, or sore throat, or other symptoms of a  cold or flu. Do not treat yourself. Try to avoid being around people who are sick. Call your health care  professional if you are around anyone with measles, chickenpox, or if you develop sores or blisters that do not heal properly. If you are going to need surgery or other procedures, tell your doctor or health care professional that you have taken this medicine within the last 12 months. Ask your doctor or health care professional about your diet. You may need to lower the amount of salt you eat. This medicine may increase blood sugar. Ask your healthcare provider if changes in diet or medicines are needed if you have diabetes. What side effects may I notice from receiving this medicine? Side effects that you should report to your doctor or health care professional as soon as possible:  allergic reactions like skin rash, itching or hives, swelling of the face, lips, or tongue  bloody or black, tarry stools  changes in emotions or moods  changes in vision  confusion, excitement, restlessness  depressed mood  eye pain  hallucinations  muscle weakness  severe or sudden stomach or belly pain  signs and symptoms of high blood sugar such as being more thirsty or hungry or having to urinate more than normal. You may also feel very tired or have blurry vision.  signs and symptoms of infection like fever; chills; cough; sore throat; pain or trouble passing urine  swelling of ankles, feet  unusual bruising or bleeding  wounds that do not heal Side effects that usually do not require medical attention (report to your doctor or health care professional if they continue or are bothersome):  increased appetite  increased growth of face or body hair  headache  nausea, vomiting  pain, redness, or irritation at site where injected  skin problems, acne, thin and shiny skin  trouble sleeping  weight gain This list may not describe all possible side effects. Call your doctor for medical advice about side effects. You may report side effects to FDA at 1-800-FDA-1088. Where should I  keep my medicine? This medicine is given in a hospital or clinic and will not be stored at home. NOTE: This sheet is a summary. It may not cover all possible information. If you have questions about this medicine, talk to your doctor, pharmacist, or health care provider.  2021 [employer] Standard (2019-04-10 13:51:58) Famotidine injection What is this medicine? FAMOTIDINE (fa MOE ti deen) is a type of antihistamine that blocks the release of stomach acid. It is used to treat stomach or intestinal ulcers. It can relieve ulcer pain and discomfort, and the heartburn from acid reflux. This medicine may be used for other purposes; ask your health care provider or pharmacist if you have questions. COMMON BRAND NAME(S): Pepcid What should I tell my health care provider before I take this medicine? They need to know if you have any of these conditions:  kidney or liver disease  an unusual or allergic reaction to famotidine, other medicines, foods, dyes, or preservatives  pregnant or trying to get pregnant  breast-feeding How should I use this medicine? This medicine is for infusion into a vein. It is given by a health care professional in a hospital or clinic setting. Talk to your pediatrician regarding the use of this medicine in children. Special care may be needed. Overdosage: If you think you have taken too much of this medicine contact a poison control center or emergency room at once. NOTE: This medicine is only for  you. Do not share this medicine with others. What if I miss a dose? This does not apply. What may interact with this medicine?  delavirdine  itraconazole  ketoconazole This list may not describe all possible interactions. Give your health care provider a list of all the medicines, herbs, non-prescription drugs, or dietary supplements you use. Also tell them if you smoke, drink alcohol, or use illegal drugs. Some items may interact with your medicine. What should I  watch for while using this medicine? Tell your doctor or health care professional if your condition does not start to get better or gets worse. Do not take with aspirin, ibuprofen, or other antiinflammatory medicines. These can aggravate your condition. Do not smoke cigarettes or drink alcohol. These increase irritation in your stomach and can increase the time it will take for ulcers to heal. Cigarettes and alcohol can also worsen acid reflux or heartburn. If you get black, tarry stools or vomit up what looks like coffee grounds, call your doctor or health care professional at once. You may have a bleeding ulcer. This medicine may cause a decrease in vitamin B12. You should make sure that you get enough vitamin B12 while you are taking this medicine. Discuss the foods you eat and the vitamins you take with your health care professional. What side effects may I notice from receiving this medicine? Side effects that you should report to your doctor or health care professional as soon as possible:  allergic reactions like skin rash, itching or hives, swelling of the face, lips, or tongue  agitation, nervousness  confusion  hallucinations Side effects that usually do not require medical attention (report to your doctor or health care professional if they continue or are bothersome):  constipation  diarrhea  dizziness  headache This list may not describe all possible side effects. Call your doctor for medical advice about side effects. You may report side effects to FDA at 1-800-FDA-1088. Where should I keep my medicine? This medicine is given in a hospital or clinic. You will not be given this medicine to store at home. NOTE: This sheet is a summary. It may not cover all possible information. If you have questions about this medicine, talk to your doctor, pharmacist, or health care provider.  2021 [employer] Standard (2017-05-13 13:16:46) Diphenhydramine injection What is this  medicine? DIPHENHYDRAMINE (dye fen HYE dra meen) is an antihistamine. It is used to treat the symptoms of an allergic reaction and motion sickness. It is also used to treat Parkinson's disease. This medicine may be used for other purposes; ask your health care provider or pharmacist if you have questions. COMMON BRAND NAME(S): Benadryl What should I tell my health care provider before I take this medicine? They need to know if you have any of these conditions:  asthma or lung disease  glaucoma  high blood pressure or heart disease  liver disease  pain or difficulty passing urine  prostate trouble  ulcers or other stomach problems  an unusual or allergic reaction to diphenhydramine, antihistamines, other medicines foods, dyes, or preservatives  pregnant or trying to get pregnant  breast-feeding How should I use this medicine? This medicine is for injection into a vein or a muscle. It is usually given by a health care professional in a hospital or clinic setting. If you get this medicine at home, you will be taught how to prepare and give this medicine. Use exactly as directed. Take your medicine at regular intervals. Do not take your medicine more  often than directed. It is important that you put your used needles and syringes in a special sharps container. Do not put them in a trash can. If you do not have a sharps container, call your pharmacist or healthcare provider to get one. Talk to your pediatrician regarding the use of this medicine in children. While this drug may be prescribed for selected conditions, precautions do apply. This medicine is not approved for use in newborns and premature babies. Patients over 49 years old may have a stronger reaction and need a smaller dose. Overdosage: If you think you have taken too much of this medicine contact a poison control center or emergency room at once. NOTE: This medicine is only for you. Do not share this medicine with  others. What if I miss a dose? If you miss a dose, take it as soon as you can. If it is almost time for your next dose, take only that dose. Do not take double or extra doses. What may interact with this medicine? Do not take this medicine with any of the following medications:  MAOIs like Carbex, Eldepryl, Marplan, Nardil, and Parnate This medicine may also interact with the following medications:  alcohol  barbiturates, like phenobarbital  medicines for bladder spasm like oxybutynin, tolterodine  medicines for blood pressure  medicines for depression, anxiety, or psychotic disturbances  medicines for movement abnormalities or Parkinson's disease  medicines for sleep  other medicines for cold, cough or allergy  some medicines for the stomach like chlordiazepoxide, dicyclomine This list may not describe all possible interactions. Give your health care provider a list of all the medicines, herbs, non-prescription drugs, or dietary supplements you use. Also tell them if you smoke, drink alcohol, or use illegal drugs. Some items may interact with your medicine. What should I watch for while using this medicine? Your condition will be monitored carefully while you are receiving this medicine. Tell your doctor or healthcare professional if your symptoms do not start to get better or if they get worse. You may get drowsy or dizzy. Do not drive, use machinery, or do anything that needs mental alertness until you know how this medicine affects you. Do not stand or sit up quickly, especially if you are an older patient. This reduces the risk of dizzy or fainting spells. Alcohol may interfere with the effect of this medicine. Avoid alcoholic drinks. Your mouth may get dry. Chewing sugarless gum or sucking hard candy, and drinking plenty of water may help. Contact your doctor if the problem does not go away or is severe. What side effects may I notice from receiving this medicine? Side effects  that you should report to your doctor or health care professional as soon as possible:  allergic reactions like skin rash, itching or hives, swelling of the face, lips, or tongue  breathing problems  changes in vision  chills  confused, agitated, nervous  irregular or fast heartbeat  low blood pressure  seizures  tremor  trouble passing urine  unusual bleeding or bruising  unusually weak or tired Side effects that usually do not require medical attention (report to your doctor or health care professional if they continue or are bothersome):  constipation, diarrhea  drowsy  headache  loss of appetite  stomach upset, vomiting  sweating  thick mucous This list may not describe all possible side effects. Call your doctor for medical advice about side effects. You may report side effects to FDA at 1-800-FDA-1088. Where should I keep my  medicine? Keep out of the reach of children. If you are using this medicine at home, you will be instructed on how to store this medicine. Throw away any unused medicine after the expiration date on the label. NOTE: This sheet is a summary. It may not cover all possible information. If you have questions about this medicine, talk to your doctor, pharmacist, or health care provider.  2021 [employer] Standard (2008-01-16 14:28:35) Paclitaxel injection What is this medicine? PACLITAXEL (PAK li TAX el) is a chemotherapy drug. It targets fast dividing cells, like cancer cells, and causes these cells to die. This medicine is used to treat ovarian cancer, breast cancer, lung cancer, Kaposi's sarcoma, and other cancers. This medicine may be used for other purposes; ask your health care provider or pharmacist if you have questions. COMMON BRAND NAME(S): Onxol, Taxol What should I tell my health care provider before I take this medicine? They need to know if you have any of these conditions:  history of irregular heartbeat  liver  disease  low blood counts, like low white cell, platelet, or red cell counts  lung or breathing disease, like asthma  tingling of the fingers or toes, or other nerve disorder  an unusual or allergic reaction to paclitaxel, alcohol, polyoxyethylated castor oil, other chemotherapy, other medicines, foods, dyes, or preservatives  pregnant or trying to get pregnant  breast-feeding How should I use this medicine? This drug is given as an infusion into a vein. It is administered in a hospital or clinic by a specially trained health care professional. Talk to your pediatrician regarding the use of this medicine in children. Special care may be needed. Overdosage: If you think you have taken too much of this medicine contact a poison control center or emergency room at once. NOTE: This medicine is only for you. Do not share this medicine with others. What if I miss a dose? It is important not to miss your dose. Call your doctor or health care professional if you are unable to keep an appointment. What may interact with this medicine? Do not take this medicine with any of the following medications:  live virus vaccines This medicine may also interact with the following medications:  antiviral medicines for hepatitis, HIV or AIDS  certain antibiotics like erythromycin and clarithromycin  certain medicines for fungal infections like ketoconazole and itraconazole  certain medicines for seizures like carbamazepine, phenobarbital, phenytoin  gemfibrozil  nefazodone  rifampin  St. John's wort This list may not describe all possible interactions. Give your health care provider a list of all the medicines, herbs, non-prescription drugs, or dietary supplements you use. Also tell them if you smoke, drink alcohol, or use illegal drugs. Some items may interact with your medicine. What should I watch for while using this medicine? Your condition will be monitored carefully while you are  receiving this medicine. You will need important blood work done while you are taking this medicine. This medicine can cause serious allergic reactions. To reduce your risk you will need to take other medicine(s) before treatment with this medicine. If you experience allergic reactions like skin rash, itching or hives, swelling of the face, lips, or tongue, tell your doctor or health care professional right away. In some cases, you may be given additional medicines to help with side effects. Follow all directions for their use. This drug may make you feel generally unwell. This is not uncommon, as chemotherapy can affect healthy cells as well as cancer cells. Report any side  effects. Continue your course of treatment even though you feel ill unless your doctor tells you to stop. Call your doctor or health care professional for advice if you get a fever, chills or sore throat, or other symptoms of a cold or flu. Do not treat yourself. This drug decreases your body's ability to fight infections. Try to avoid being around people who are sick. This medicine may increase your risk to bruise or bleed. Call your doctor or health care professional if you notice any unusual bleeding. Be careful brushing and flossing your teeth or using a toothpick because you may get an infection or bleed more easily. If you have any dental work done, tell your dentist you are receiving this medicine. Avoid taking products that contain aspirin, acetaminophen, ibuprofen, naproxen, or ketoprofen unless instructed by your doctor. These medicines may hide a fever. Do not become pregnant while taking this medicine. Women should inform their doctor if they wish to become pregnant or think they might be pregnant. There is a potential for serious side effects to an unborn child. Talk to your health care professional or pharmacist for more information. Do not breast-feed an infant while taking this medicine. Men are advised not to father a  child while receiving this medicine. This product may contain alcohol. Ask your pharmacist or healthcare provider if this medicine contains alcohol. Be sure to tell all healthcare providers you are taking this medicine. Certain medicines, like metronidazole and disulfiram, can cause an unpleasant reaction when taken with alcohol. The reaction includes flushing, headache, nausea, vomiting, sweating, and increased thirst. The reaction can last from 30 minutes to several hours. What side effects may I notice from receiving this medicine? Side effects that you should report to your doctor or health care professional as soon as possible:  allergic reactions like skin rash, itching or hives, swelling of the face, lips, or tongue  breathing problems  changes in vision  fast, irregular heartbeat  high or low blood pressure  mouth sores  pain, tingling, numbness in the hands or feet  signs of decreased platelets or bleeding - bruising, pinpoint red spots on the skin, black, tarry stools, blood in the urine  signs of decreased red blood cells - unusually weak or tired, feeling faint or lightheaded, falls  signs of infection - fever or chills, cough, sore throat, pain or difficulty passing urine  signs and symptoms of liver injury like dark yellow or brown urine; general ill feeling or flu-like symptoms; light-colored stools; loss of appetite; nausea; right upper belly pain; unusually weak or tired; yellowing of the eyes or skin  swelling of the ankles, feet, hands  unusually slow heartbeat Side effects that usually do not require medical attention (report to your doctor or health care professional if they continue or are bothersome):  diarrhea  hair loss  loss of appetite  muscle or joint pain  nausea, vomiting  pain, redness, or irritation at site where injected  tiredness This list may not describe all possible side effects. Call your doctor for medical advice about side effects.  You may report side effects to FDA at 1-800-FDA-1088. Where should I keep my medicine? This drug is given in a hospital or clinic and will not be stored at home. NOTE: This sheet is a summary. It may not cover all possible information. If you have questions about this medicine, talk to your doctor, pharmacist, or health care provider.  2021 [employer] Standard (2019-08-29 13:37:23)

## 2020-11-20 NOTE — Telephone Encounter (Signed)
No 2/9 los. No changes made to pt's schedule.

## 2020-11-21 ENCOUNTER — Other Ambulatory Visit: Payer: Self-pay | Admitting: *Deleted

## 2020-11-21 ENCOUNTER — Other Ambulatory Visit: Payer: Self-pay | Admitting: Student

## 2020-11-21 DIAGNOSIS — Z170 Estrogen receptor positive status [ER+]: Secondary | ICD-10-CM

## 2020-11-21 DIAGNOSIS — C50411 Malignant neoplasm of upper-outer quadrant of right female breast: Secondary | ICD-10-CM

## 2020-11-24 ENCOUNTER — Encounter (HOSPITAL_COMMUNITY): Payer: Self-pay

## 2020-11-24 ENCOUNTER — Ambulatory Visit (HOSPITAL_COMMUNITY)
Admission: RE | Admit: 2020-11-24 | Discharge: 2020-11-24 | Disposition: A | Discharge status: Home / Self Care | Payer: 59 | Source: Ambulatory Visit | Attending: Oncology | Admitting: Oncology

## 2020-11-24 ENCOUNTER — Other Ambulatory Visit: Payer: Self-pay

## 2020-11-24 DIAGNOSIS — I10 Essential (primary) hypertension: Secondary | ICD-10-CM | POA: Insufficient documentation

## 2020-11-24 DIAGNOSIS — C787 Secondary malignant neoplasm of liver and intrahepatic bile duct: Secondary | ICD-10-CM | POA: Insufficient documentation

## 2020-11-24 DIAGNOSIS — C50411 Malignant neoplasm of upper-outer quadrant of right female breast: Secondary | ICD-10-CM

## 2020-11-24 DIAGNOSIS — Z79899 Other long term (current) drug therapy: Secondary | ICD-10-CM | POA: Diagnosis not present

## 2020-11-24 DIAGNOSIS — C50911 Malignant neoplasm of unspecified site of right female breast: Secondary | ICD-10-CM | POA: Diagnosis present

## 2020-11-24 DIAGNOSIS — Z7901 Long term (current) use of anticoagulants: Secondary | ICD-10-CM | POA: Diagnosis not present

## 2020-11-24 HISTORY — PX: IR IMAGING GUIDED PORT INSERTION: IMG5740

## 2020-11-24 IMAGING — XA IR IMAGING GUIDED PORT INSERTION
2 series · 2 of 2 positions shown · non-contrast
Comparison: none

INDICATION: 49-year-old female with breast cancer metastatic to the liver. She
presents for placement of a left-sided port catheter. Her right
internal jugular vein is chronically occluded.

[Series 1: (id) · 1 of 1 slices shown]
[im 1/1]
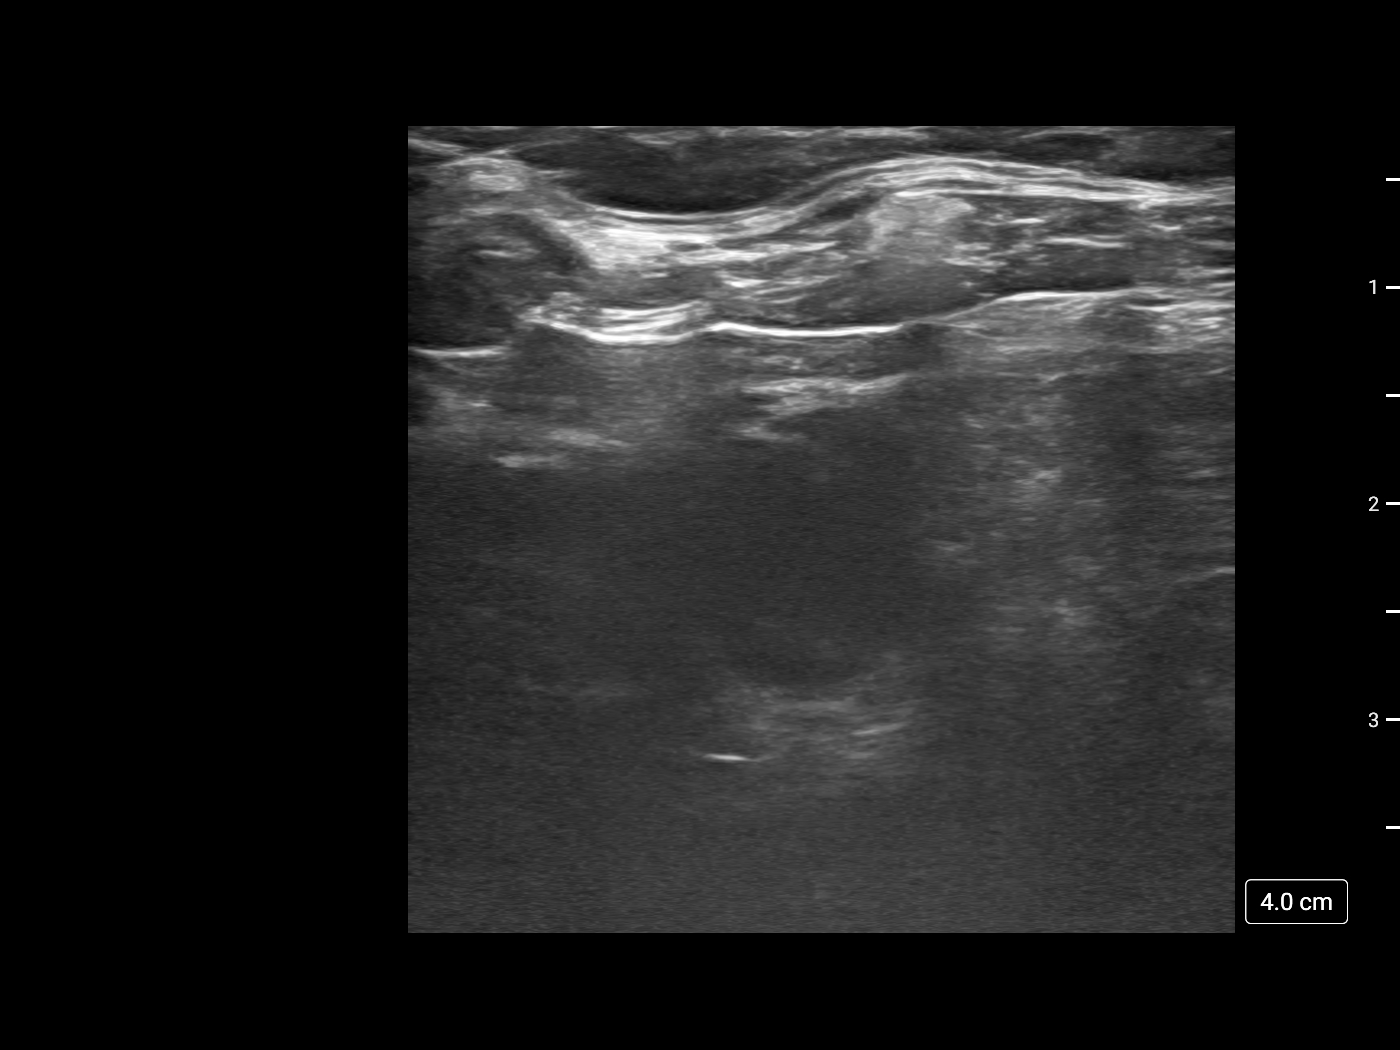

[Series 300: ir imaging guided port insertion · 1 of 1 slices shown]
[im 1/1]
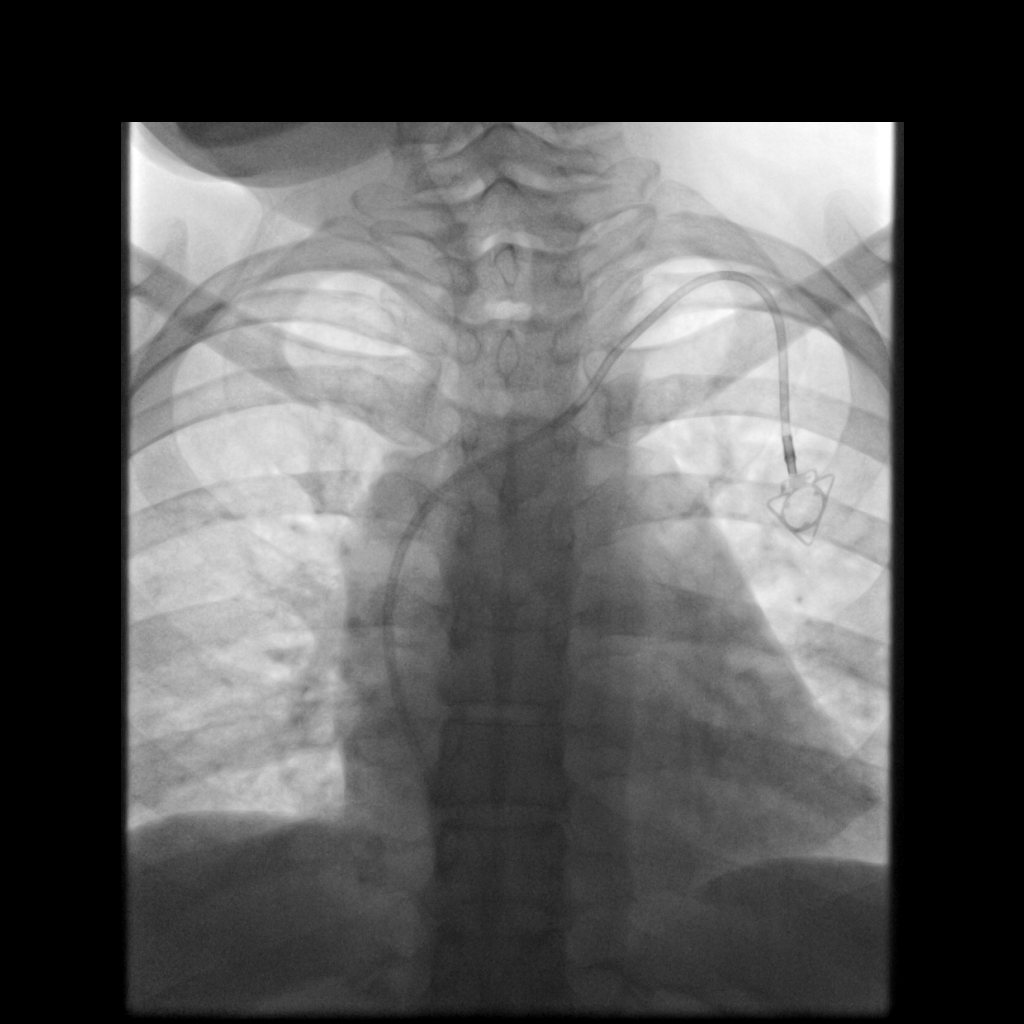

[2 of 2 positions shown; findings below may reference images not displayed]

EXAM:
IMPLANTED PORT A CATH PLACEMENT WITH ULTRASOUND AND FLUOROSCOPIC
GUIDANCE

MEDICATIONS:
None.

ANESTHESIA/SEDATION:
Versed 2 mg IV; Fentanyl 100 mcg IV;

Moderate Sedation Time:  21 minutes

The patient was continuously monitored during the procedure by the
interventional radiology nurse under my direct supervision.

FLUOROSCOPY TIME:  0 minutes, 36 seconds (6 mGy)

COMPLICATIONS:
None immediate.

PROCEDURE:
The left neck and chest was prepped with chlorhexidine, and draped
in the usual sterile fashion using maximum barrier technique (cap
and mask, sterile gown, sterile gloves, large sterile sheet, hand
hygiene and cutaneous antiseptic). Local anesthesia was attained by
infiltration with 1% lidocaine with epinephrine.

Ultrasound demonstrated patency of the left internal jugular vein
vein, and this was documented with an image. Under real-time
ultrasound guidance, this vein was accessed with a 21 gauge
micropuncture needle and image documentation was performed. A small
dermatotomy was made at the access site with an 11 scalpel. A 0.018"
wire was advanced into the SVC and the access needle exchanged for a
4F micropuncture vascular sheath. The 0.018" wire was then removed
and a 0.035" wire advanced into the IVC.



The venous access site was then serially dilated and a peel away
vascular sheath placed over the wire. The wire was removed and the
port catheter advanced into position under fluoroscopic guidance.
The catheter tip is positioned in the superior cavoatrial junction.
This was documented with a spot image. The portacatheter was then
tested and found to flush and aspirate well. The port was flushed
with saline followed by 100 units/mL heparinized saline.

The pocket was then closed in two layers using first subdermal
inverted interrupted absorbable sutures followed by a running
subcuticular suture. The epidermis was then sealed with Dermabond.
The dermatotomy at the venous access site was also closed with
Dermabond.
IMPRESSION: Successful placement of a left IJ approach Power Port with
ultrasound and fluoroscopic guidance. The catheter is ready for use.

## 2020-11-24 MED ORDER — LIDOCAINE-EPINEPHRINE 1 %-1:100000 IJ SOLN
INTRAMUSCULAR | Status: AC
  Filled 2020-11-24: qty 1

## 2020-11-24 MED ORDER — LIDOCAINE-EPINEPHRINE 1 %-1:100000 IJ SOLN
INTRAMUSCULAR | Status: AC | PRN
  Administered 2020-11-24: 5 mL
  Administered 2020-11-24: 10 mL

## 2020-11-24 MED ORDER — SODIUM CHLORIDE 0.9 % IV SOLN: INTRAVENOUS | Status: DC

## 2020-11-24 MED ORDER — FENTANYL CITRATE (PF) 100 MCG/2ML IJ SOLN
INTRAMUSCULAR | Status: AC
  Filled 2020-11-24: qty 2

## 2020-11-24 MED ORDER — MIDAZOLAM HCL 2 MG/2ML IJ SOLN
INTRAMUSCULAR | Status: AC | PRN
  Administered 2020-11-24 (×2): 1 mg via INTRAVENOUS

## 2020-11-24 MED ORDER — HEPARIN SOD (PORK) LOCK FLUSH 100 UNIT/ML IV SOLN
INTRAVENOUS | Status: AC | PRN
  Administered 2020-11-24: 500 [IU] via INTRAVENOUS

## 2020-11-24 MED ORDER — MIDAZOLAM HCL 2 MG/2ML IJ SOLN
INTRAMUSCULAR | Status: AC
  Filled 2020-11-24: qty 4

## 2020-11-24 MED ORDER — HEPARIN SOD (PORK) LOCK FLUSH 100 UNIT/ML IV SOLN
INTRAVENOUS | Status: AC
  Filled 2020-11-24: qty 5

## 2020-11-24 MED ORDER — FENTANYL CITRATE (PF) 100 MCG/2ML IJ SOLN
INTRAMUSCULAR | Status: AC | PRN
  Administered 2020-11-24 (×2): 50 ug via INTRAVENOUS

## 2020-11-24 NOTE — Procedures (Signed)
Interventional Radiology Procedure Note  Procedure: Placement of a Left IJ approach single lumen PowerPort.  Tip is positioned in the upper RA and catheter is ready for immediate use.  Complications: No immediate Recommendations:  - Ok to shower tomorrow - Do not submerge for 7 days - Routine line care   Signed,  Criselda Peaches, MD

## 2020-11-24 NOTE — H&P (Signed)
Referring Physician(s): Chauncey Cruel  Supervising Physician: Jacqulynn Cadet  Patient Status:  [facility]  Chief Complaint:  "I'm getting a port a cath"  Subjective: Veronica [name] is a 50 y.o. female with history of hypertension, multiple VTE/PE and right breast carcinoma diagnosed in 2020, status post lumpectomy with chemoradiation.  Recent imaging has revealed numerous bilobar peripheral enhancing solid hepatic lesions concerning for metastatic disease. She underwent liver lesion bx on 11/03/20 with pathology yielding metastatic carcinoma c/w breast primary. She has poor venous access and  presents again today for port a cath placement for chemotherapy.   Past Medical History:  Diagnosis Date  . Cancer ([place])   . Chronic low back pain with left-sided sciatica   . Family history of leukemia   . Family history of stomach cancer   . Hypertension   . Personal history of chemotherapy   . Personal history of radiation therapy    finished june '21   Past Surgical History:  Procedure Laterality Date  . APPLICATION OF WOUND VAC N/A 10/16/2019   Procedure: APPLICATION OF WOUND VAC;  Surgeon: Gaye Pollack, MD;  Location: [facility];  Service: Vascular;  Laterality: N/A;  . BREAST BIOPSY Right 05/02/2019   right clips X2  . BREAST LUMPECTOMY Right    06/2019  . BREAST LUMPECTOMY WITH RADIOACTIVE SEED AND SENTINEL LYMPH NODE BIOPSY Right 06/12/2019   Procedure: RIGHT BREAST RADIOACTIVE SEED LUMPECTOMY  AND RIGHT AXILLARY SEED TARGETED LYMPH NODE AND AXILLARY SENTINEL LYMPH NODE MAPPING;  Surgeon: Erroll Luna, MD;  Location: [place];  Service: General;  Laterality: Right;  PEC BLOCK  . C/S x2  '98 '03  . CENTRAL VENOUS CATHETER INSERTION  10/02/2019   Procedure: Insertion Central Line Adult;  Surgeon: Serafina Mitchell, MD;  Location: [place];  Service: Vascular;;  . CESAREAN SECTION    . CHOLECYSTECTOMY    . I & D EXTREMITY N/A 10/16/2019   Procedure: DEBRIDEMENT of  DEHISCED MIDLINE SURGICAL INCISION;  Surgeon: Gaye Pollack, MD;  Location: [facility];  Service: Vascular;  Laterality: N/A;  . LAPAROSCOPIC ASSISTED VAGINAL HYSTERECTOMY  05/17/2011   Procedure: LAPAROSCOPIC ASSISTED VAGINAL HYSTERECTOMY;  Surgeon: Sharene Butters;  Location: [facility];  Service: Gynecology;  Laterality: N/A;  Laparoscopic Assisted Vaginal Hysterectomy With Lysis Of Adhesions  . PERICARDIAL WINDOW N/A 10/02/2019   Procedure: Pericardial Window;  Surgeon: Serafina Mitchell, MD;  Location: [place];  Service: Vascular;  Laterality: N/A;  . PORT-A-CATH REMOVAL  10/02/2019   Procedure: Removal Port-A-Cath;  Surgeon: Serafina Mitchell, MD;  Location: [place];  Service: Vascular;;  . PORTACATH PLACEMENT Right 06/12/2019   Procedure: INSERTION PORT-A-CATH WITH ULTRASOUND;  Surgeon: Erroll Luna, MD;  Location: [facility];  Service: General;  Laterality: Right;  . RE-EXCISION OF BREAST LUMPECTOMY Right 07/17/2019   Procedure: RE-EXCISION OF RIGHT BREAST LUMPECTOMY;  Surgeon: Erroll Luna, MD;  Location: [place];  Service: General;  Laterality: Right;  . TUBAL LIGATION    . ULTRASOUND GUIDANCE FOR VASCULAR ACCESS  10/02/2019   Procedure: Ultrasound Guidance For Vascular Access;  Surgeon: Serafina Mitchell, MD;  Location: [facility];  Service: Vascular;;  . VENOGRAM N/A 10/02/2019   Procedure: Central VENOGRAM;  Surgeon: Serafina Mitchell, MD;  Location: [facility];  Service: Vascular;  Laterality: N/A;       Allergies: Patient has no known allergies.  Medications: Prior to Admission medications   Medication Sig Start Date  End Date Taking? Authorizing Provider  Cholecalciferol (VITAMIN D3) 25 MCG (1000 UT) CHEW Chew 1 tablet by mouth daily.     [provider]  gabapentin (NEURONTIN) 300 MG capsule Take 1 capsule (300 mg total) by mouth at bedtime as needed. 11/12/20   Magrinat, Virgie Dad, MD  metoprolol tartrate (LOPRESSOR) 25 MG tablet Take 1 tablet (25 mg  total) by mouth 2 (two) times daily. 07/23/20 08/22/20  Magrinat, Virgie Dad, MD  ondansetron (ZOFRAN) 8 MG tablet Take 1 tablet (8 mg total) by mouth 2 (two) times daily as needed (Nausea or vomiting). 11/12/20   Magrinat, Virgie Dad, MD  prochlorperazine (COMPAZINE) 10 MG tablet Take 1 tablet (10 mg total) by mouth every 6 (six) hours as needed (Nausea or vomiting). 11/12/20   Magrinat, Virgie Dad, MD  XARELTO 20 MG TABS tablet TAKE 1 TABLET BY MOUTH  DAILY WITH SUPPER 09/11/20   Magrinat, Virgie Dad, MD     Vital Signs:pending   Physical Exam awake/alert; chest- CTA bilat; heart- RRR; abd- soft,+BS,NT; no LE edema  Imaging: No results found.  Labs:  CBC: Recent Labs    08/05/20 1045 10/29/20 1508 11/03/20 1143 11/19/20 0749  WBC 5.8 6.9 5.5 4.9  HGB 15.3* 14.7 15.2* 14.5  HCT 44.9 43.0 44.3 42.9  PLT 184 219 185 204    COAGS: Recent Labs    01/14/20 1123 11/03/20 1143  INR 1.4* 0.8    BMP: Recent Labs    01/14/20 1123 04/24/20 1035 04/24/20 1035 08/05/20 1045 10/07/20 1206 10/29/20 1508 11/03/20 1143 11/19/20 0749  NA 144 140  --  138  --  141 139 141  K 4.6 4.1  --  4.4  --  4.0 4.1 4.3  CL 108 107  --  106  --  103 106 107  CO2 25 25  --  25  --  25 24 25   GLUCOSE 113* 105*  --  118*  --  116* 132* 143*  BUN 14 13  --  14  --  14 14 10   CALCIUM 9.9 9.4  --  9.9  --  10.0 9.7 9.6  CREATININE 0.76 0.75   < > 0.79 0.80 0.82 0.81 0.80  GFRNONAA >60 >60  --  >60  --  >60 >60 >60  GFRAA >60 >60  --   --   --   --   --   --    < > = values in this interval not displayed.    LIVER FUNCTION TESTS: Recent Labs    08/05/20 1045 10/29/20 1508 11/03/20 1143 11/19/20 0749  BILITOT 1.2 0.7 1.0 1.2  AST 31 29 28  36  ALT 37 35 32 51*  ALKPHOS 101 176* 130* 137*  PROT 6.8 7.7 7.4 7.0  ALBUMIN 3.8 4.1 4.3 3.9    Assessment and Plan: 50 y.o. female with history of hypertension, multiple VTE/PE and right breast carcinoma diagnosed in 2020, status post lumpectomy with  chemoradiation.  Recent imaging has revealed numerous bilobar peripheral enhancing solid hepatic lesions concerning for metastatic disease. She underwent liver lesion bx on 11/03/20 with pathology yielding metastatic carcinoma c/w breast primary. She has poor venous access and  presents again today for port a cath placement for chemotherapy. Risks and benefits of image guided port-a-catheter placement was discussed with the patient including, but not limited to bleeding, infection, pneumothorax, or fibrin sheath development and need for additional procedures.  All of the patient's questions were answered, patient is agreeable  to proceed. Consent signed and in chart.     Electronically Signed: D. Rowe Robert, PA-C 11/24/2020, 12:16 PM   I spent a total of 25 minutes at the the patient's bedside AND on the patient's hospital floor or unit, greater than 50% of which was counseling/coordinating care for port a cath placement

## 2020-11-24 NOTE — Discharge Instructions (Signed)
Urgent needs - Interventional Radiology on call MD [phone] ° °Wound - May remove dressing and shower in 24 to 48 hours.  Keep site clean and dry.  Replace with bandaid as needed.  Do not submerge in tub or water until site healing well. If closed with glue, glue will flake off on its own. ° °If ordered by your provider, may start Emla cream in 2 weeks or after incision is healed. ° °After completion of treatment, your provider should have you set up for monthly port flushes.  ° ° °Implanted Port Insertion, Care After °This sheet gives you information about how to care for yourself after your procedure. Your health care provider may also give you more specific instructions. If you have problems or questions, contact your health care provider. °What can I expect after the procedure? °After the procedure, it is common to have: °· Discomfort at the port insertion site. °· Bruising on the skin over the port. This should improve over 3-4 days. °Follow these instructions at home: °Port care °· After your port is placed, you will get a manufacturer's information card. The card has information about your port. Keep this card with you at all times. °· Take care of the port as told by your health care provider. Ask your health care provider if you or a family member can get training for taking care of the port at home. A home health care nurse may also take care of the port. °· Make sure to remember what type of port you have. °Incision care °· Follow instructions from your health care provider about how to take care of your port insertion site. Make sure you: °? Wash your hands with soap and water before and after you change your bandage (dressing). If soap and water are not available, use hand sanitizer. °? Change your dressing as told by your health care provider. °? Leave stitches (sutures), skin glue, or adhesive strips in place. These skin closures may need to stay in place for 2 weeks or longer. If adhesive strip  edges start to loosen and curl up, you may trim the loose edges. Do not remove adhesive strips completely unless your health care provider tells you to do that. °· Check your port insertion site every day for signs of infection. Check for: °? Redness, swelling, or pain. °? Fluid or blood. °? Warmth. °? Pus or a bad smell.  °  °  °Activity °· Return to your normal activities as told by your health care provider. Ask your health care provider what activities are safe for you. °· Do not lift anything that is heavier than 10 lb (4.5 kg), or the limit that you are told, until your health care provider says that it is safe. °General instructions °· Take over-the-counter and prescription medicines only as told by your health care provider. °· Do not take baths, swim, or use a hot tub until your health care provider approves. Ask your health care provider if you may take showers. You may only be allowed to take sponge baths. °· Do not drive for 24 hours if you were given a sedative during your procedure. °· Wear a medical alert bracelet in case of an emergency. This will tell any health care providers that you have a port. °· Keep all follow-up visits as told by your health care provider. This is important. °Contact a health care provider if: °· You cannot flush your port with saline as directed, or you cannot draw blood   from the port. °· You have a fever or chills. °· You have redness, swelling, or pain around your port insertion site. °· You have fluid or blood coming from your port insertion site. °· Your port insertion site feels warm to the touch. °· You have pus or a bad smell coming from the port insertion site. °Get help right away if: °· You have chest pain or shortness of breath. °· You have bleeding from your port that you cannot control. °Summary °· Take care of the port as told by your health care provider. Keep the manufacturer's information card with you at all times. °· Change your dressing as told by your  health care provider. °· Contact a health care provider if you have a fever or chills or if you have redness, swelling, or pain around your port insertion site. °· Keep all follow-up visits as told by your health care provider. °This information is not intended to replace advice given to you by your health care provider. Make sure you discuss any questions you have with your health care provider. °Document Revised: 04/25/2018 Document Reviewed: 04/25/2018 °Elsevier Patient Education © 2021 [employer] ° ° °Moderate Conscious Sedation, Adult, Care After °This sheet gives you information about how to care for yourself after your procedure. Your health care provider may also give you more specific instructions. If you have problems or questions, contact your health care provider. °What can I expect after the procedure? °After the procedure, it is common to have: °· Sleepiness for several hours. °· Impaired judgment for several hours. °· Difficulty with balance. °· Vomiting if you eat too soon. °Follow these instructions at home: °For the time period you were told by your health care provider: °· Rest. °· Do not participate in activities where you could fall or become injured. °· Do not drive or use machinery. °· Do not drink alcohol. °· Do not take sleeping pills or medicines that cause drowsiness. °· Do not make important decisions or sign legal documents. °· Do not take care of children on your own.  °  °  °Eating and drinking °· Follow the diet recommended by your health care provider. °· Drink enough fluid to keep your urine pale yellow. °· If you vomit: °? Drink water, juice, or soup when you can drink without vomiting. °? Make sure you have little or no nausea before eating solid foods.   °General instructions °· Take over-the-counter and prescription medicines only as told by your health care provider. °· Have a responsible adult stay with you for the time you are told. It is important to have someone help  care for you until you are awake and alert. °· Do not smoke. °· Keep all follow-up visits as told by your health care provider. This is important. °Contact a health care provider if: °· You are still sleepy or having trouble with balance after 24 hours. °· You feel light-headed. °· You keep feeling nauseous or you keep vomiting. °· You develop a rash. °· You have a fever. °· You have redness or swelling around the IV site. °Get help right away if: °· You have trouble breathing. °· You have new-onset confusion at home. °Summary °· After the procedure, it is common to feel sleepy, have impaired judgment, or feel nauseous if you eat too soon. °· Rest after you get home. Know the things you should not do after the procedure. °· Follow the diet recommended by your health care provider and drink enough   fluid to keep your urine pale yellow. °· Get help right away if you have trouble breathing or new-onset confusion at home. °This information is not intended to replace advice given to you by your health care provider. Make sure you discuss any questions you have with your health care provider. °Document Revised: 01/25/2020 Document Reviewed: 08/23/2019 °Elsevier Patient Education © 2021 [employer] ° ° °

## 2020-11-25 ENCOUNTER — Encounter (HOSPITAL_COMMUNITY)
Admission: RE | Admit: 2020-11-25 | Discharge: 2020-11-25 | Disposition: A | Discharge status: Home / Self Care | Payer: 59 | Source: Ambulatory Visit | Attending: Oncology | Admitting: Oncology

## 2020-11-25 ENCOUNTER — Ambulatory Visit (HOSPITAL_COMMUNITY)
Admission: RE | Admit: 2020-11-25 | Discharge: 2020-11-25 | Disposition: A | Discharge status: Home / Self Care | Payer: 59 | Source: Ambulatory Visit | Attending: Oncology | Admitting: Oncology

## 2020-11-25 ENCOUNTER — Other Ambulatory Visit: Payer: Self-pay

## 2020-11-25 DIAGNOSIS — Z7189 Other specified counseling: Secondary | ICD-10-CM | POA: Diagnosis present

## 2020-11-25 DIAGNOSIS — C787 Secondary malignant neoplasm of liver and intrahepatic bile duct: Secondary | ICD-10-CM | POA: Insufficient documentation

## 2020-11-25 DIAGNOSIS — C50411 Malignant neoplasm of upper-outer quadrant of right female breast: Secondary | ICD-10-CM | POA: Insufficient documentation

## 2020-11-25 DIAGNOSIS — Z170 Estrogen receptor positive status [ER+]: Secondary | ICD-10-CM | POA: Diagnosis present

## 2020-11-25 IMAGING — NM NM BONE WHOLE BODY
2 series · 2 of 2 positions shown · non-contrast
Comparison: None

Radiographic correlation: CT angio chest [DATE]

CLINICAL DATA: RIGHT breast cancer post lumpectomy and sentinel
lymph node biopsy

EXAM:
NUCLEAR MEDICINE WHOLE BODY BONE SCAN
TECHNIQUE: Whole body anterior and posterior images were obtained approximately
3 hours after intravenous injection of radiopharmaceutical.
RADIOPHARMACEUTICALS:  21.2 mCi [ON] MDP IV

[Series 1: whole body · 2.66mm/px · 1 of 1 slices shown (1 of 2)]
[im 1/1]
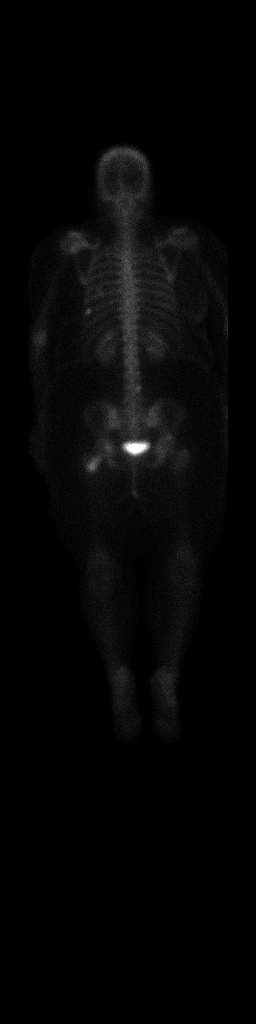

[Series 1: whole body · 2.66mm/px · 1 of 1 slices shown (2 of 2)]
[im 1/1]
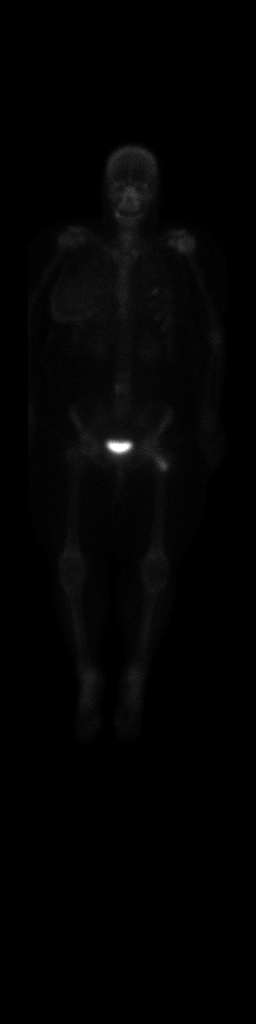

[2 of 2 positions shown; findings below may reference images not displayed]

FINDINGS: Foci of abnormal tracer uptake at anterior LEFT fourth rib and
posterior LEFT eighth rib.

Anterior fourth rib lesion corresponds to subtle area of sclerosis
on prior CT series 11, image 65.

Posterior LEFT eighth rib lesion corresponds to a lytic lesion on CT
series 11 image 86.

Large area of increased tracer uptake at the intertrochanteric
region proximal LEFT femur extending into neck, suspicious for
metastasis recommend radiographic correlation.

Uptake at shoulders, sternoclavicular joints, hips, typically
degenerative.

Uptake at RIGHT lateral aspect of lumbar spine at L3, indeterminate,
could be degenerative or metastatic.

Uptake at RIGHT mandible, typically dental in origin.

No additional sites of abnormal tracer accumulation.

Increased tracer uptake in the RIGHT breast question related to
edema / post therapy changes.

Otherwise expected urinary tract and soft tissue distribution of
tracer.
IMPRESSION: Suspected metastatic lesions involving the anterior LEFT fourth rib,
posterior LEFT eighth rib, and proximal LEFT femur at the
intertrochanteric region.

Recommend LEFT hip radiographs to exclude lesion at risk for
pathologic fracture.

These results will be called to the ordering clinician or
representative by the Radiologist Assistant, and communication
documented in the PACS or [REDACTED].

## 2020-11-25 IMAGING — CT CT HEAD W/O CM
3 of 4 series · 15 of 47 positions shown, 18 images · non-contrast
Comparison: CT head [DATE].

CLINICAL DATA: Evaluate for cerebral hemorrhage or Mets. Breast
cancer.

EXAM:
CT HEAD WITHOUT CONTRAST
TECHNIQUE: Contiguous axial images were obtained from the base of the skull
through the vertex without intravenous contrast.

[Series 2: head wo · axial · 0.45mm/px · z∈[-83,+47]mm · 9 of 32 slices shown, 12 images]
[im 3/32  brain]
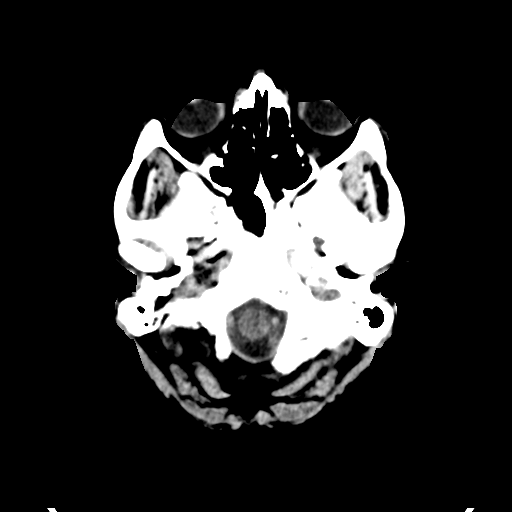
[im 3/32  bone]
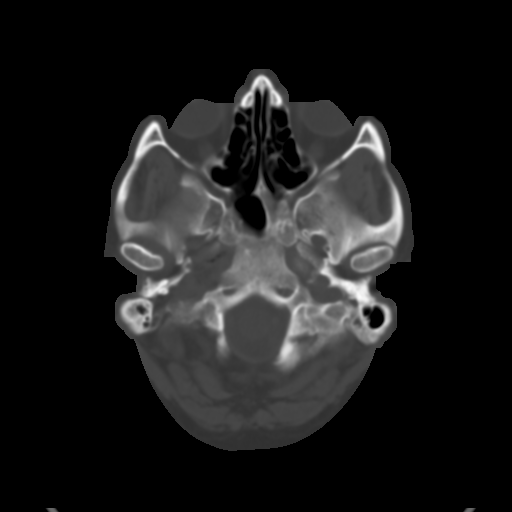
[im 7/32  brain]
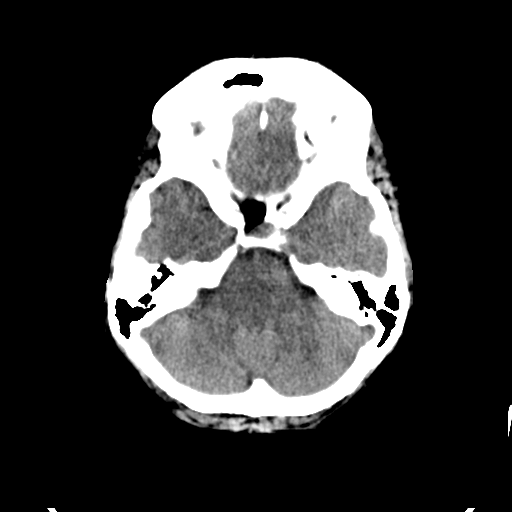
[im 9/32  brain]
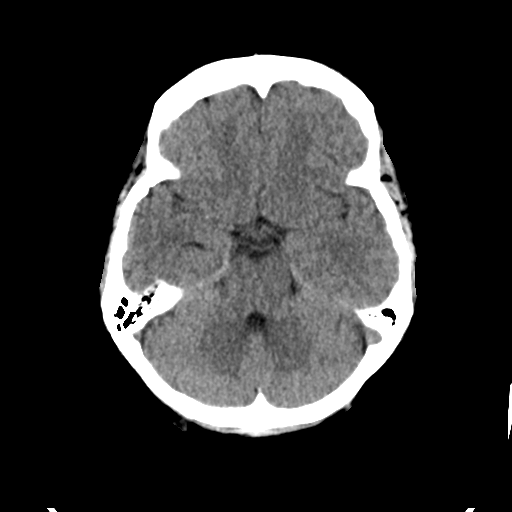
[im 14/32  brain]
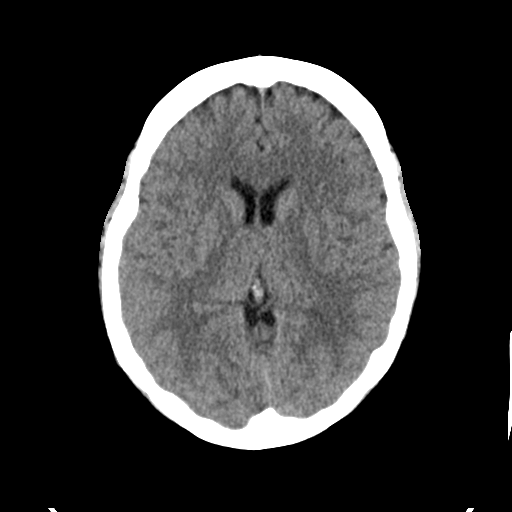
[im 16/32  brain]
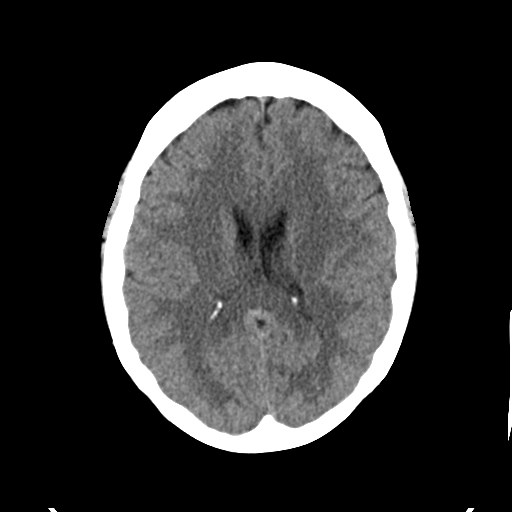
[im 16/32  bone]
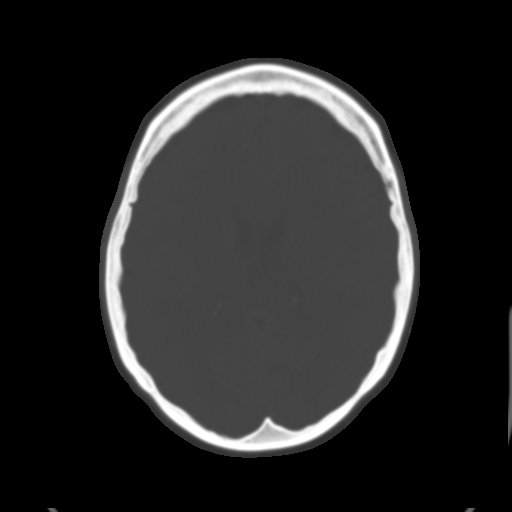
[im 18/32  brain]
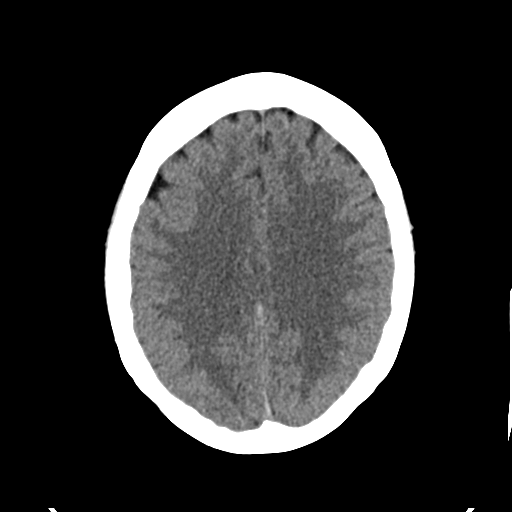
[im 23/32  brain]
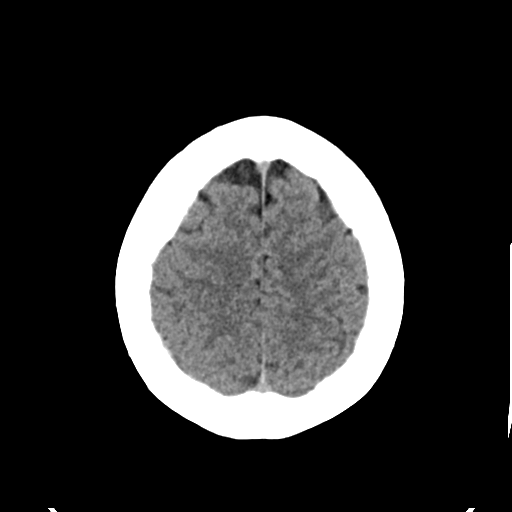
[im 25/32  brain]
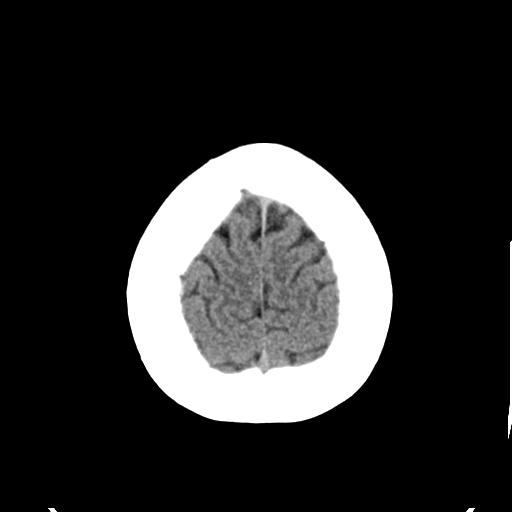
[im 29/32  brain]
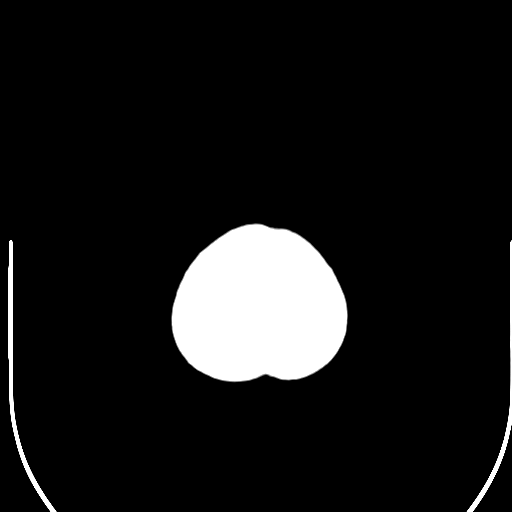
[im 29/32  bone]
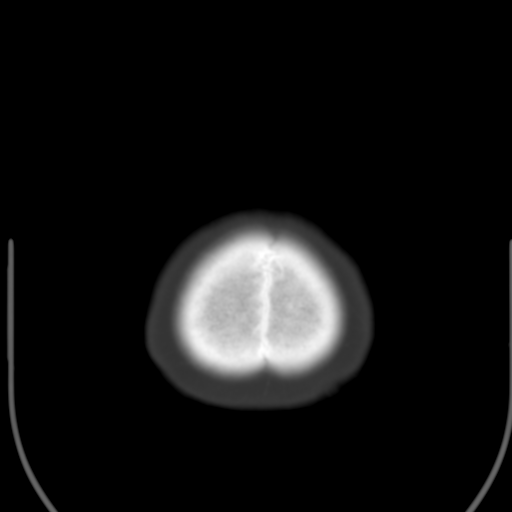

[Series 4: coronal soft tissue · coronal · 0.32mm/px · 3 of 67 slices shown]
[im 23/67  brain]
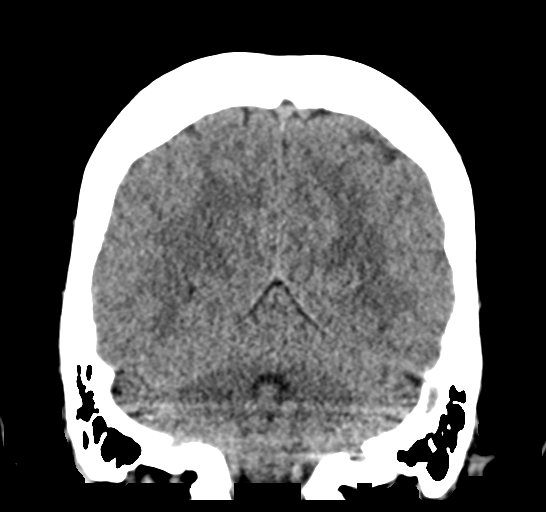
[im 30/67  brain]
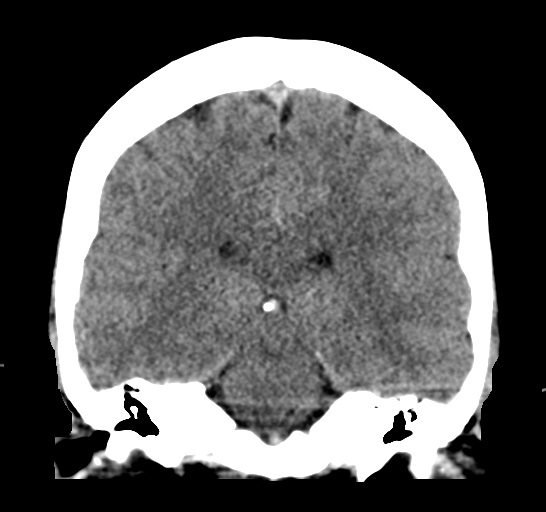
[im 37/67  brain]
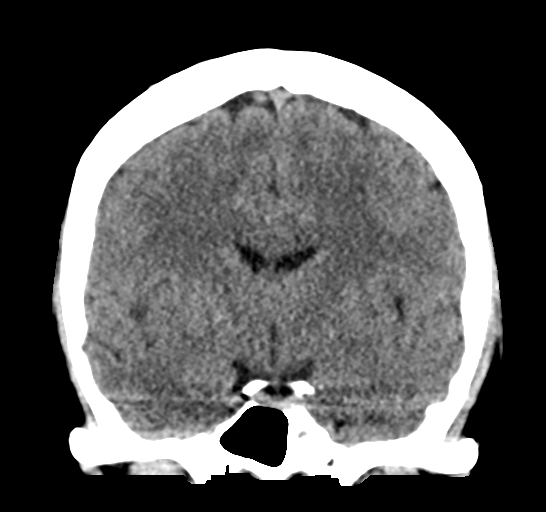

[Series 5: sagittal soft tissue · sagittal · 0.32mm/px · 3 of 56 slices shown]
[im 19/56  brain]
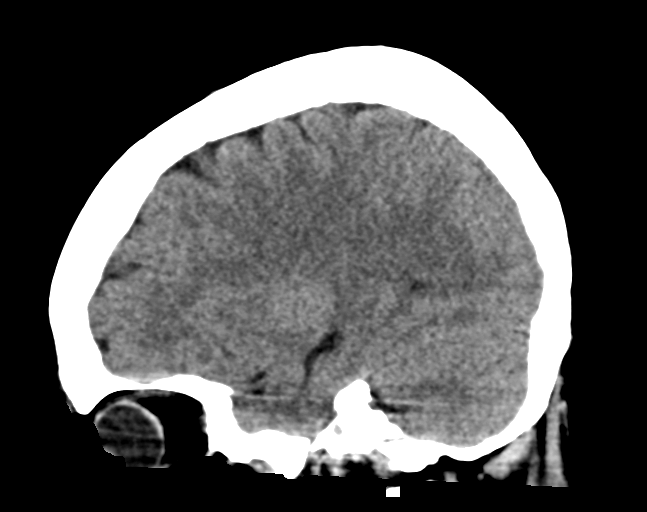
[im 28/56  brain]
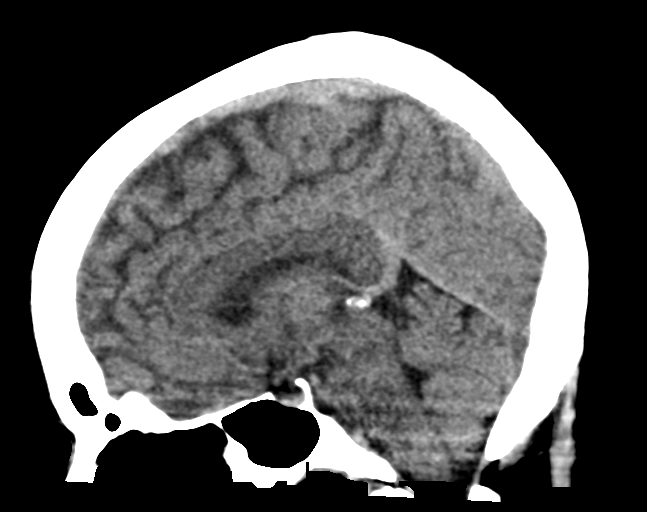
[im 37/56  brain]
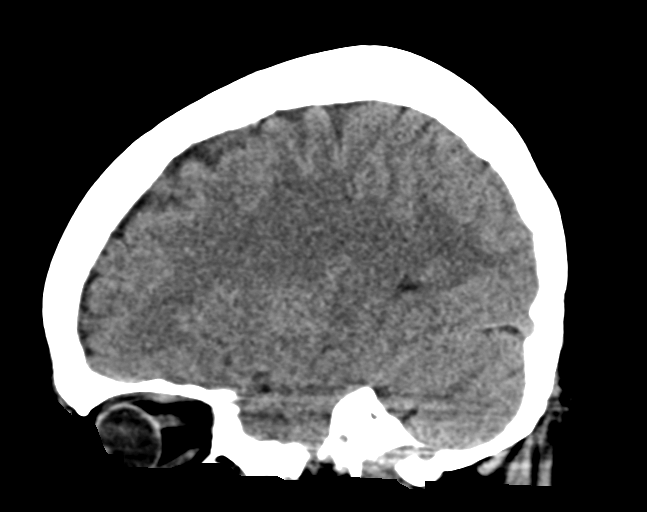

[15 of 47 positions shown; findings below may reference images not displayed]

FINDINGS: Brain: No evidence of acute large vascular territory infarction,
hemorrhage, hydrocephalus, extra-axial collection or mass
lesion/mass effect.

Vascular: No hyperdense vessel identified.

Skull: No acute fracture.

Sinuses/Orbits: Air-fluid level in left maxillary and left sphenoid
sinuses. Unremarkable orbits.

Other: No mastoid effusion.
IMPRESSION: 1. No evidence of acute abnormality. An MRI with contrast could
provide more sensitive evaluation for metastatic disease if
clinically indicated.
2. Nonspecific left maxillary and left sphenoid sinus air-fluid
levels. Correlate with signs/symptoms of sinusitis.

## 2020-11-25 MED ORDER — TECHNETIUM TC 99M MEDRONATE IV KIT
21.2000 | PACK | Freq: Once | INTRAVENOUS | Status: AC | PRN
  Administered 2020-11-25: 21.2 via INTRAVENOUS

## 2020-11-25 NOTE — Progress Notes (Signed)
[place]  [phone]) [phone] Fax:([phone]) [phone]     ID: Veronica [name] DOB: [birth year]  MR#: [id number]  ULA#:[id number]  Patient Care Team: Jamie Kato as PCP - General (Family Medicine) Mauro Kaufmann, RN as Oncology Nurse Navigator [name], RN as Oncology Nurse Navigator Erroll Luna, MD as Consulting Physician (General Surgery) Rojelio Uhrich, Virgie Dad, MD as Consulting Physician (Oncology) Kyung Rudd, MD as Consulting Physician (Radiation Oncology) Gaye Pollack, MD as Consulting Physician (Cardiothoracic Surgery) Serafina Mitchell, MD as Consulting Physician (Vascular Surgery) Chauncey Cruel, MD OTHER MD:  CHIEF COMPLAINT: Estrogen receptor positive breast cancer  CURRENT TREATMENT: Paclitaxel day 1 and day 8 of each 21-day cycle   INTERVAL HISTORY: Veronica [name] returns today for follow-up of her estrogen receptor positive breast cancer accompanied by her husband Heath Lark  She started paclitaxel, given on days 1 and 8, every 21 days, on 11/20/2020.  We had also written for Keytruda to be given concomitantly but this was not approved by her insurance.   Since her last visit she had a port placement.  It feels a little bit sore but otherwise went well and today it was easily accessed  To complete restaging she had a bone scan and noncontrast head CT done yesterday, 11/25/2020. The head CT showed no evidence of acute abnormality.   Bone scan results are pending but raises a concern of at least 1 lesion in the left hip area.  I am awaiting final reading by radiology  Lab Results  Component Value Date   CA2729 73.9 (H) 11/19/2020   CA2729 104.2 (H) 10/29/2020    REVIEW OF SYSTEMS: Veronica [name] did generally well with her first dose of paclitaxel.  She felt tired and took a nap that day.  She was feeling better the next day.  The evening of treatment and 2 days later she had some diarrhea, up to 4 times a day.  She took Imodium appropriately.  On day  2 she had some facial flushing.  She has had no peripheral neuropathy symptoms.  Currently she has no sinus symptoms.  A detailed review of systems was otherwise stable   COVID 19 VACCINATION STATUS: refuses vaccination, had COVID 29 September 2019   HISTORY OF CURRENT ILLNESS: From the original intake note:  Veronica [name] had routine screening mammography on 04/27/2019 showing a possible abnormality in the right breast. She underwent right diagnostic mammography with tomography and right breast ultrasonography at [facility] on 05/01/2019 showing: breast density category C; highly suspicious 1.7 cm irregular mass in the right breast at 10 o'clock, 8 cm from the nipple; indeterminate hypoechoic round mass in the right axillary tail/low axilla. Physical exam showed no suspicious lumps.  Accordingly on 05/02/2019 she proceeded to biopsy of the right breast area in question. The pathology from this procedure ([id number]) showed: invasive ductal carcinoma, grade 3; ductal carcinoma in situ; lymphovascular invasion present. Prognostic indicators significant for: estrogen receptor, 30% positive with moderate staining intensity, and progesterone receptor, 30% positive with strong staining intensity. Proliferation marker Ki67 at 20%. HER2 negative by immunohistochemistry (1+).  The second "satellite" lesion was a lymph node which was also positive.  The patient's subsequent history is as detailed below.   PAST MEDICAL HISTORY: Past Medical History:  Diagnosis Date  . Cancer [facility]) 2020   breast Cancer, 2022 liver mets  . Chronic low back pain with left-sided sciatica   . Family history of leukemia   . Family history  of stomach cancer   . Hypertension   . Personal history of chemotherapy   . Personal history of radiation therapy    finished june '21    PAST SURGICAL HISTORY: Past Surgical History:  Procedure Laterality Date  . APPLICATION OF WOUND VAC N/A 10/16/2019   Procedure: APPLICATION  OF WOUND VAC;  Surgeon: Gaye Pollack, MD;  Location: [facility];  Service: Vascular;  Laterality: N/A;  . BREAST BIOPSY Right 05/02/2019   right clips X2  . BREAST LUMPECTOMY Right    06/2019  . BREAST LUMPECTOMY WITH RADIOACTIVE SEED AND SENTINEL LYMPH NODE BIOPSY Right 06/12/2019   Procedure: RIGHT BREAST RADIOACTIVE SEED LUMPECTOMY  AND RIGHT AXILLARY SEED TARGETED LYMPH NODE AND AXILLARY SENTINEL LYMPH NODE MAPPING;  Surgeon: Erroll Luna, MD;  Location: [facility];  Service: General;  Laterality: Right;  PEC BLOCK  . C/S x2  '98 '03  . CENTRAL VENOUS CATHETER INSERTION  10/02/2019   Procedure: Insertion Central Line Adult;  Surgeon: Serafina Mitchell, MD;  Location: [facility];  Service: Vascular;;  . CESAREAN SECTION    . CHOLECYSTECTOMY    . I & D EXTREMITY N/A 10/16/2019   Procedure: DEBRIDEMENT of DEHISCED MIDLINE SURGICAL INCISION;  Surgeon: Gaye Pollack, MD;  Location: [facility];  Service: Vascular;  Laterality: N/A;  . IR IMAGING GUIDED PORT INSERTION  11/24/2020  . LAPAROSCOPIC ASSISTED VAGINAL HYSTERECTOMY  05/17/2011   Procedure: LAPAROSCOPIC ASSISTED VAGINAL HYSTERECTOMY;  Surgeon: Sharene Butters;  Location: [facility];  Service: Gynecology;  Laterality: N/A;  Laparoscopic Assisted Vaginal Hysterectomy With Lysis Of Adhesions  . PERICARDIAL WINDOW N/A 10/02/2019   Procedure: Pericardial Window;  Surgeon: Serafina Mitchell, MD;  Location: [facility];  Service: Vascular;  Laterality: N/A;  . PORT-A-CATH REMOVAL  10/02/2019   Procedure: Removal Port-A-Cath;  Surgeon: Serafina Mitchell, MD;  Location: [facility];  Service: Vascular;;  . PORTACATH PLACEMENT Right 06/12/2019   Procedure: INSERTION PORT-A-CATH WITH ULTRASOUND;  Surgeon: Erroll Luna, MD;  Location: [facility];  Service: General;  Laterality: Right;  . RE-EXCISION OF BREAST LUMPECTOMY Right 07/17/2019   Procedure: RE-EXCISION OF RIGHT BREAST LUMPECTOMY;  Surgeon: Erroll Luna, MD;  Location: [facility];  Service: General;  Laterality: Right;  . TUBAL LIGATION    . ULTRASOUND GUIDANCE FOR VASCULAR ACCESS  10/02/2019   Procedure: Ultrasound Guidance For Vascular Access;  Surgeon: Serafina Mitchell, MD;  Location: [facility] OR;  Service: Vascular;;  . VENOGRAM N/A 10/02/2019   Procedure: Central VENOGRAM;  Surgeon: Serafina Mitchell, MD;  Location: [facility] OR;  Service: Vascular;  Laterality: N/A;    FAMILY HISTORY: Family History  Problem Relation Age of Onset  . Arthritis Mother   . COPD Mother   . Hypertension Mother   . Hyperlipidemia Mother   . Leukemia Brother 13  . Stomach cancer Maternal Grandmother        late 91s  Patient's parents are living (as of September 2020). Her father is 33 and her mother is 53 as of 04/2019. The patient denies a family hx of breast or ovarian cancer. She has 7 siblings, 6 brothers and 1 sister. She reports her maternal grandmother was diagnosed with stomach cancer at age 71. Her brother was diagnosed with leukemia at age 77.   GYNECOLOGIC HISTORY:  No LMP recorded. Patient has had a hysterectomy. Menarche: 50 years old Age at first live birth: 50 years old GX P 2 (+1 stillborn) LMP  age 8-42 Contraceptive: unsure, maybe for a year at age 48. HRT no Hysterectomy? Yes, 05/2011 BSO? 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 She is married. Husband Sheppard Plumber") is a Hydrologist for a telephone company. She lives at home with her husband and two daughters. Daughter Suszanne Conners, age 48, is a Scientist, water quality. Daughter Jarrett Soho, age 60, is a Ship broker.     ADVANCED DIRECTIVES: Husband Heath Lark is her HCPOA.   HEALTH MAINTENANCE: Social History   Tobacco Use  . Smoking status: Never Smoker  . Smokeless tobacco: Never Used  Vaping Use  . Vaping Use: Never used  Substance Use Topics  . Alcohol use: Not Currently    Alcohol/week: 2.0 standard drinks    Types: 2 Standard drinks or equivalent per week    Comment: "2-3  a week"  . Drug use: No     Colonoscopy: n/a  PAP: 02/2018  Bone density: n/a   No Known Allergies  Current Outpatient Medications  Medication Sig Dispense Refill  . Cholecalciferol (VITAMIN D3) 25 MCG (1000 UT) CHEW Chew 1 tablet by mouth daily.     Marland Kitchen gabapentin (NEURONTIN) 300 MG capsule Take 1 capsule (300 mg total) by mouth at bedtime as needed. 90 capsule 4  . metoprolol tartrate (LOPRESSOR) 25 MG tablet Take 1 tablet (25 mg total) by mouth 2 (two) times daily. 60 tablet 0  . ondansetron (ZOFRAN) 8 MG tablet Take 1 tablet (8 mg total) by mouth 2 (two) times daily as needed (Nausea or vomiting). 30 tablet 1  . prochlorperazine (COMPAZINE) 10 MG tablet Take 1 tablet (10 mg total) by mouth every 6 (six) hours as needed (Nausea or vomiting). 90 tablet 1  . rivaroxaban (XARELTO) 20 MG TABS tablet Take 1 tablet (20 mg total) by mouth daily with supper. 90 tablet 3   No current facility-administered medications for this visit.    OBJECTIVE: white woman in no acute distress  Vitals:   11/26/20 0824  BP: 115/64  Pulse: 78  Resp: 18  Temp: 97.7 F (36.5 C)  SpO2: 100%   Wt Readings from Last 3 Encounters:  11/26/20 215 lb 6.4 oz (97.7 kg)  11/19/20 215 lb 6.4 oz (97.7 kg)  11/12/20 216 lb 8 oz (98.2 kg)   Body mass index is 40.7 kg/m.    ECOG FS:1 - Symptomatic but completely ambulatory  Sclerae unicteric, EOMs intact Wearing a mask No cervical or supraclavicular adenopathy Lungs no rales or rhonchi Heart regular rate and rhythm Abd soft, obese nontender, positive bowel sounds MSK no focal spinal tenderness, no upper extremity lymphedema Neuro: nonfocal, well oriented, appropriate affect Breasts: Deferred   LAB RESULTS:  CMP     Component Value Date/Time   NA 141 11/19/2020 0749   K 4.3 11/19/2020 0749   CL 107 11/19/2020 0749   CO2 25 11/19/2020 0749   GLUCOSE 143 (H) 11/19/2020 0749   BUN 10 11/19/2020 0749   CREATININE 0.80 11/19/2020 0749   CREATININE  0.75 04/24/2020 1035   CALCIUM 9.6 11/19/2020 0749   PROT 7.0 11/19/2020 0749   ALBUMIN 3.9 11/19/2020 0749   AST 36 11/19/2020 0749   AST 23 04/24/2020 1035   ALT 51 (H) 11/19/2020 0749   ALT 20 04/24/2020 1035   ALKPHOS 137 (H) 11/19/2020 0749   BILITOT 1.2 11/19/2020 0749   BILITOT 1.1 04/24/2020 1035   GFRNONAA >60 11/19/2020 0749   GFRNONAA >60 04/24/2020 1035   GFRAA >60  04/24/2020 1035    No results found for: TOTALPROTELP, ALBUMINELP, A1GS, A2GS, BETS, BETA2SER, GAMS, MSPIKE, SPEI  No results found for: KPAFRELGTCHN, LAMBDASER, KAPLAMBRATIO  Lab Results  Component Value Date   WBC 4.0 11/26/2020   NEUTROABS 3.0 11/26/2020   HGB 12.6 11/26/2020   HCT 36.6 11/26/2020   MCV 85.5 11/26/2020   PLT 180 11/26/2020   No results found for: LABCA2  No components found for: [id number]  No results for input(s): INR in the last 168 hours.  No results found for: LABCA2  No results found for: CAN199  No results found for: CAN125  No results found for: MWN027  Lab Results  Component Value Date   CA2729 73.9 (H) 11/19/2020    No components found for: HGQUANT  Lab Results  Component Value Date   CEA1 2.45 10/29/2020   /  CEA ([facility])  Date Value Ref Range Status  10/29/2020 2.45 0.00 - 5.00 ng/mL Final    Comment:    (NOTE) This test was performed using Architect's Chemiluminescent Microparticle Immunoassay. Values obtained from different assay methods cannot be used interchangeably. Please note that 5-10% of patients who smoke may see CEA levels up to 6.9 ng/mL. Performed at [facility], [place] [address], [place],  [address]      No results found for: AFPTUMOR  No results found for: [place]  No results found for: HGBA, HGBA2QUANT, HGBFQUANT, HGBSQUAN (Hemoglobinopathy evaluation)   Lab Results  Component Value Date   LDH 170 09/30/2019    No results found for: IRON, TIBC, IRONPCTSAT (Iron and TIBC)  Lab  Results  Component Value Date   FERRITIN 498 (H) 10/19/2019    Urinalysis    Component Value Date/Time   COLORURINE YELLOW 08/28/2019 2326   APPEARANCEUR CLOUDY (A) 08/28/2019 2326   LABSPEC 1.013 08/28/2019 2326   PHURINE 5.0 08/28/2019 2326   GLUCOSEU NEGATIVE 08/28/2019 2326   HGBUR MODERATE (A) 08/28/2019 2326   BILIRUBINUR NEGATIVE 08/28/2019 2326   KETONESUR NEGATIVE 08/28/2019 2326   PROTEINUR NEGATIVE 08/28/2019 2326   NITRITE NEGATIVE 08/28/2019 2326   LEUKOCYTESUR MODERATE (A) 08/28/2019 2326    STUDIES: CT Head Wo Contrast  Result Date: 11/25/2020 CLINICAL DATA:  Evaluate for cerebral hemorrhage or Mets. Breast cancer. EXAM: CT HEAD WITHOUT CONTRAST TECHNIQUE: Contiguous axial images were obtained from the base of the skull through the vertex without intravenous contrast. COMPARISON:  CT head 07/16/2020. FINDINGS: Brain: No evidence of acute large vascular territory infarction, hemorrhage, hydrocephalus, extra-axial collection or mass lesion/mass effect. Vascular: No hyperdense vessel identified. Skull: No acute fracture. Sinuses/Orbits: Air-fluid level in left maxillary and left sphenoid sinuses. Unremarkable orbits. Other: No mastoid effusion. IMPRESSION: 1. No evidence of acute abnormality. An MRI with contrast could provide more sensitive evaluation for metastatic disease if clinically indicated. 2. Nonspecific left maxillary and left sphenoid sinus air-fluid levels. Correlate with signs/symptoms of sinusitis. Electronically Signed   By: Margaretha Sheffield MD   On: 11/25/2020 10:10   NM Bone Scan Whole Body  Result Date: 11/26/2020 CLINICAL DATA:  RIGHT breast cancer post lumpectomy and sentinel lymph node biopsy EXAM: NUCLEAR MEDICINE WHOLE BODY BONE SCAN TECHNIQUE: Whole body anterior and posterior images were obtained approximately 3 hours after intravenous injection of radiopharmaceutical. RADIOPHARMACEUTICALS:  21.2 mCi Technetium-13m MDP IV COMPARISON:  None  Radiographic correlation: CT angio chest 10/07/2020 FINDINGS: Foci of abnormal tracer uptake at anterior LEFT fourth rib and posterior LEFT eighth rib. Anterior fourth rib lesion corresponds to  subtle area of sclerosis on prior CT series 11, image 65. Posterior LEFT eighth rib lesion corresponds to a lytic lesion on CT series 11 image 86. Large area of increased tracer uptake at the intertrochanteric region proximal LEFT femur extending into neck, suspicious for metastasis recommend radiographic correlation. Uptake at shoulders, sternoclavicular joints, hips, typically degenerative. Uptake at RIGHT lateral aspect of lumbar spine at L3, indeterminate, could be degenerative or metastatic. Uptake at RIGHT mandible, typically dental in origin. No additional sites of abnormal tracer accumulation. Increased tracer uptake in the RIGHT breast question related to edema / post therapy changes. Otherwise expected urinary tract and soft tissue distribution of tracer. IMPRESSION: Suspected metastatic lesions involving the anterior LEFT fourth rib, posterior LEFT eighth rib, and proximal LEFT femur at the intertrochanteric region. Recommend LEFT hip radiographs to exclude lesion at risk for pathologic fracture. These results will be called to the ordering clinician or representative by the Radiologist Assistant, and communication documented in the PACS or [employer]. Electronically Signed   By: Lavonia Dana M.D.   On: 11/26/2020 08:45   [place] CORE BIOPSY (LIVER)  Result Date: 11/03/2020 INDICATION: History of breast cancer with liver masses concerning for metastatic disease. EXAM: ULTRASOUND BIOPSY CORE LIVER MEDICATIONS: None. ANESTHESIA/SEDATION: Moderate (conscious) sedation was employed during this procedure. A total of Versed 2 mg and Fentanyl 100 mcg was administered intravenously. Moderate Sedation Time: 10 minutes. The patient's level of consciousness and vital signs were monitored continuously by radiology nursing  throughout the procedure under my direct supervision. FLUOROSCOPY TIME:  None. COMPLICATIONS: None immediate. PROCEDURE: Informed written consent was obtained from the patient after a thorough discussion of the procedural risks, benefits and alternatives. All questions were addressed. Maximal Sterile Barrier Technique was utilized including caps, mask, sterile gowns, sterile gloves, sterile drape, hand hygiene and skin antiseptic. A timeout was performed prior to the initiation of the procedure. Ultrasound was used to interrogate the liver. The background hepatic parenchyma is echogenic and coarsened consistent with hepatic steatosis. Several ill-defined hypoechoic masses are present within the superior aspect of the right hemi liver. A suitable mass was targeted for biopsy. The overlying skin entry site was marked. The skin was then sterilely prepped and draped in the standard fashion using chlorhexidine skin prep. Local anesthesia was attained by infiltration with 1% lidocaine. A small dermatotomy was made. Under real-time ultrasound guidance, a 17 gauge introducer needle was carefully advanced and positioned at the margin of the mass. Multiple 18 gauge core biopsies were then coaxially obtained using the bio Pince automated biopsy device. Biopsy specimens were placed in formalin and delivered to pathology for further analysis. As the introducer needle was removed, the biopsy tract was embolized with a Gel-Foam slurry. Post biopsy ultrasound imaging demonstrates no evidence of immediate complication. The patient tolerated the procedure well. IMPRESSION: Technically successful ultrasound-guided core biopsy of liver lesion. Electronically Signed   By: Jacqulynn Cadet M.D.   On: 11/03/2020 15:01   IR IMAGING GUIDED PORT INSERTION  Result Date: 11/24/2020 INDICATION: 50 year old female with breast cancer metastatic to the liver. She presents for placement of a left-sided port catheter. 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 EXAM: IMPLANTED PORT A CATH PLACEMENT WITH ULTRASOUND AND FLUOROSCOPIC GUIDANCE MEDICATIONS: None. ANESTHESIA/SEDATION: Versed 2 mg IV; Fentanyl 100 mcg IV; Moderate Sedation Time:  21 minutes The patient was continuously monitored during the procedure by the interventional radiology nurse under my direct supervision. FLUOROSCOPY TIME:  0 minutes, 36 seconds (6 mGy) COMPLICATIONS:  None immediate. PROCEDURE: The left neck and chest was prepped with chlorhexidine, and draped in the usual sterile fashion using maximum barrier technique (cap and mask, sterile gown, sterile gloves, large sterile sheet, hand hygiene and cutaneous antiseptic). Local anesthesia was attained by infiltration with 1% lidocaine with epinephrine. Ultrasound demonstrated patency of the left internal jugular vein vein, and this was documented with an image. Under real-time ultrasound guidance, this vein was accessed with a 21 gauge micropuncture needle and image documentation was performed. A small dermatotomy was made at the access site with an 11 scalpel. A 0.018" wire was advanced into the SVC and the access needle exchanged for a 69F micropuncture vascular sheath. The 0.018" wire was then removed and a 0.035" wire advanced into the IVC. An appropriate location for the subcutaneous reservoir was selected below the clavicle and an incision was made through the skin and underlying soft tissues. The subcutaneous tissues were then dissected using a combination of blunt and sharp surgical technique and a pocket was formed. A single lumen power injectable portacatheter was then tunneled through the subcutaneous tissues from the pocket to the dermatotomy and the port reservoir placed within the subcutaneous pocket. The venous access site was then serially dilated and a peel away vascular sheath placed over the wire. The wire was removed and the port catheter advanced into position under fluoroscopic guidance. The catheter  tip is positioned in the superior cavoatrial junction. This was documented with a spot image. The portacatheter was then tested and found to flush and aspirate well. The port was flushed with saline followed by 100 units/mL heparinized saline. The pocket was then closed in two layers using first subdermal inverted interrupted absorbable sutures followed by a running subcuticular suture. The epidermis was then sealed with Dermabond. The dermatotomy at the venous access site was also closed with Dermabond. IMPRESSION: Successful placement of a left IJ approach Power Port with ultrasound and fluoroscopic guidance. The catheter is ready for use. Electronically Signed   By: Jacqulynn Cadet M.D.   On: 11/24/2020 16:16     ELIGIBLE FOR AVAILABLE RESEARCH PROTOCOL: AET  ASSESSMENT: 50 y.o. [place], [place] woman status post right breast upper outer quadrant biopsy 05/01/2019 for a clinical T1c N1, stage IIA invasive ductal carcinoma, grade 3, estrogen and progesterone receptor positive, HER-2 nonamplified, with an MIB-1-1 of 20%.  (a) mass in the axillary tail was a positive lymph node  (1) MammaPrint obtained from the original biopsy shows a high risk luminal B tumor and predicts a 5-year disease-free survival of 91% in her case  (2) genetics testing 05/09/2019 through the Common Hereditary Gene Panel offered by [facility] found no deleterious mutations in APC, ATM, AXIN2, BARD1, BMPR1A, BRCA1, BRCA2, BRIP1, CDH1, CDK4, CDKN2A (p14ARF), CDKN2A (p16INK4a), CHEK2, CTNNA1, DICER1, EPCAM (Deletion/duplication testing only), GREM1 (promoter region deletion/duplication testing only), KIT, MEN1, MLH1, MSH2, MSH3, MSH6, MUTYH, NBN, NF1, NHTL1, PALB2, PDGFRA, PMS2, POLD1, POLE, PTEN, RAD50, RAD51C, RAD51D, RNF43, SDHB, SDHC, SDHD, SMAD4, SMARCA4. STK11, TP53, TSC1, TSC2, and VHL.  The following genes were evaluated for sequence changes only: SDHA and HOXB13 c.251G>A variant only.   (a) A variant of uncertain significance  (VUS) was detected in one of her MSH6 genes (c.831A>C).  (3) status post right lumpectomy and sentinel lymph node sampling 06/12/2019 for a pT2 pN1, stage IIA invasive ductal carcinoma, grade 2, with positive margins  (a) a total of 4 sentinel lymph nodes removed, one positive (with ECE), ine itc  (b) margin clearance 04/19/2019 successful (  medial margin close but negative for DCIS)  (4) adjuvant chemotherapy consisting of doxorubicin and cyclophosphamide in dose dense fashion x4 starting 07/10/2019, completed 09/24/2019; planned weekly paclitaxel x12 omitted   (a) echo 06/26/2019 shows an EF of 60-65%  (b echo on 09/19/2019 shows an EF of 60-65%  (c) chemotherapy stopped after four cycles of doxorubicin and cyclophosphamide due to #5 below  (5) Multiple VTE/PE documented 10/02/2019, on intravenous heparin initially, then Xarelto started 10/19/2019  (a) presented with SVC syndrome and bilateral pulmonary emboli on 09/28/2019  (b) s/p SVC thrombectomy and port removal 97/98/9211 complicated by hemopericardium and tamponade, necessitating pericardial window placement   (c) postop course complicated by wound dehiscence requiring wound VAC  (c) Doppler 10/03/2019 showed right lower extremity DVT  (d) CT angio and Doppler of both upper extremities 10/13/2019 found persistent acute bilateral pulmonary emboli, persistent but improved SVC thrombus, bilateral pleural effusions, and right lower lobe collapse. 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 This was delivered using a 3-D conformal technique. The patient then received a boost to the seroma. This delivered an additional 10 Gy in 5 fractions using a 3-field photon boost technique. The total dose was 60.4 Gy.  (8) anastrozole started mid May 2021  (a) [facility] and estradiol obtained 11/15/2019 consistent with menopause  (b) not a good candidate for tamoxifen given history of DVT/PE above  (c) bone density 07/31/2020 normal (T score equals 0.1).  METASTATIC DISEASE: Jan 2022 (9) CT angio of the chest 12/28/2021shows new liver lesions (not seen on CT angio January 2021)  (a) MRI 10/22/2020 shows multiple liver lesions, the largest 2.5 cm  (b)   liver biopsy 11/03/2020 confirms metastatic carcinoma estrogen receptor 10% positive with weak staining intensity, HER-2 and progesterone receptor negative.  (c) bone scan 11/25/2020 shows left femoral lesion as well as other lesions  (d) non-contrast head CT 11/25/2020 negative except for sinusitis  (e) CA 27-29 is informative (was 104.1 on 10/29/2020)  (10) [facility] requested on 11/03/2020 liver biopsy  (11) started paclitaxel 11/19/2018 2012, repeated day 1 day 8 of every 21-day cycle  (a) pembrolizumab to be added  (12) zoledronate to be added 12/10/2020   PLAN:  Veronica [name] did generally well with her 1st dose of Taxol and particularly if she has no symptoms of peripheral neuropathy.  I suspect the flushing she had was due to the steroids she received with premeds and we are dropping the dose to 10 mg today and 4 mg with subsequent treatments.  Taxol does not generally cause diarrhea in my experience.  She is using Imodium appropriately if this problem recurs.  She is now ready to resume Xarelto and I have entered that prescription for her.  Cost may be an issue and if so I have asked her to let our pharmacy know so they may be able perhaps to find a coupon or other support.  We  would like to add Keytruda to her treatment if possible.  This was denied by  insurance.  We are working through the company to see if they can provide it as a courtesy to the patient.  The bone scan shows a lesion in the left femoral head.  I am setting her up for plain films of the left hip to make sure we are not at risk of pathologic fracture.  We are adding zoledronate to her 2nd cycle of chemotherapy  At this point the plan is to go through 4 cycles of paclitaxel and restage.  She is seeking a 2nd opinion at [facility] tomorrow.  I have given her a copy of her history (as in the "plan" above) in case they are not able to fully access my notes as sometimes happens in Epic  Total encounter time 35 minutes.Sarajane Jews C. Lakelynn Severtson, MD Medical Oncology and Hematology [facility] [place], [place] [address] Tel. [phone]    Fax. [phone]   I, Wilburn Mylar, am acting as scribe for Dr. Virgie Dad. Korynne Dols.  I, Lurline Del MD, have reviewed the above documentation for accuracy and completeness, and I agree with the above.   *Total Encounter Time as defined by the Centers for Medicare and Medicaid Services includes, in addition to the face-to-face time of a patient visit (documented in the note above) non-face-to-face time: obtaining and reviewing outside history, ordering and reviewing medications, tests or procedures, care coordination (communications with other health care professionals or caregivers) and documentation in the medical record.

## 2020-11-26 ENCOUNTER — Ambulatory Visit (HOSPITAL_COMMUNITY)
Admission: RE | Admit: 2020-11-26 | Discharge: 2020-11-26 | Disposition: A | Discharge status: Home / Self Care | Payer: 59 | Source: Ambulatory Visit | Attending: Oncology | Admitting: Oncology

## 2020-11-26 ENCOUNTER — Inpatient Hospital Stay: Payer: 59

## 2020-11-26 ENCOUNTER — Other Ambulatory Visit: Payer: Self-pay | Admitting: Pharmacist

## 2020-11-26 ENCOUNTER — Inpatient Hospital Stay (HOSPITAL_BASED_OUTPATIENT_CLINIC_OR_DEPARTMENT_OTHER): Payer: 59 | Admitting: Oncology

## 2020-11-26 ENCOUNTER — Other Ambulatory Visit: Payer: Self-pay

## 2020-11-26 VITALS — BP 115/64 | HR 78 | Temp 97.7°F | Resp 18 | Ht 61.0 in | Wt 215.4 lb

## 2020-11-26 DIAGNOSIS — C7951 Secondary malignant neoplasm of bone: Secondary | ICD-10-CM | POA: Insufficient documentation

## 2020-11-26 DIAGNOSIS — Z170 Estrogen receptor positive status [ER+]: Secondary | ICD-10-CM

## 2020-11-26 DIAGNOSIS — C787 Secondary malignant neoplasm of liver and intrahepatic bile duct: Secondary | ICD-10-CM

## 2020-11-26 DIAGNOSIS — C50411 Malignant neoplasm of upper-outer quadrant of right female breast: Secondary | ICD-10-CM

## 2020-11-26 DIAGNOSIS — Z95828 Presence of other vascular implants and grafts: Secondary | ICD-10-CM

## 2020-11-26 LAB — CBC WITH DIFFERENTIAL/PLATELET
Abs Immature Granulocytes: 0.07 10*3/uL (ref 0.00–0.07)
Basophils Absolute: 0 10*3/uL (ref 0.0–0.1)
Basophils Relative: 1 %
Eosinophils Absolute: 0.1 10*3/uL (ref 0.0–0.5)
Eosinophils Relative: 2 %
HCT: 36.6 % (ref 36.0–46.0)
Hemoglobin: 12.6 g/dL (ref 12.0–15.0)
Immature Granulocytes: 2 %
Lymphocytes Relative: 19 %
Lymphs Abs: 0.8 10*3/uL (ref 0.7–4.0)
MCH: 29.4 pg (ref 26.0–34.0)
MCHC: 34.4 g/dL (ref 30.0–36.0)
MCV: 85.5 fL (ref 80.0–100.0)
Monocytes Absolute: 0.1 10*3/uL (ref 0.1–1.0)
Monocytes Relative: 2 %
Neutro Abs: 3 10*3/uL (ref 1.7–7.7)
Neutrophils Relative %: 74 %
Platelets: 180 10*3/uL (ref 150–400)
RBC: 4.28 MIL/uL (ref 3.87–5.11)
RDW: 11.9 % (ref 11.5–15.5)
WBC: 4 10*3/uL (ref 4.0–10.5)
nRBC: 0 % (ref 0.0–0.2)

## 2020-11-26 LAB — COMPREHENSIVE METABOLIC PANEL
ALT: 36 U/L (ref 0–44)
AST: 23 U/L (ref 15–41)
Albumin: 3.5 g/dL (ref 3.5–5.0)
Alkaline Phosphatase: 99 U/L (ref 38–126)
Anion gap: 6 (ref 5–15)
BUN: 14 mg/dL (ref 6–20)
CO2: 23 mmol/L (ref 22–32)
Calcium: 8.8 mg/dL — ABNORMAL LOW (ref 8.9–10.3)
Chloride: 107 mmol/L (ref 98–111)
Creatinine, Ser: 0.73 mg/dL (ref 0.44–1.00)
GFR, Estimated: >60 mL/min (ref >60)
Glucose, Bld: 165 mg/dL — ABNORMAL HIGH (ref 70–99)
Potassium: 4.2 mmol/L (ref 3.5–5.1)
Sodium: 136 mmol/L (ref 135–145)
Total Bilirubin: 1.7 mg/dL — ABNORMAL HIGH (ref 0.3–1.2)
Total Protein: 6.3 g/dL — ABNORMAL LOW (ref 6.5–8.1)

## 2020-11-26 IMAGING — DX DG HIP (WITH OR WITHOUT PELVIS) 1V*L*
2 series · 2 of 2 positions shown · non-contrast
Comparison: None.

CLINICAL DATA: Bone metastasis

EXAM:
DG HIP (WITH OR WITHOUT PELVIS) 1V*L*

[hip ap]
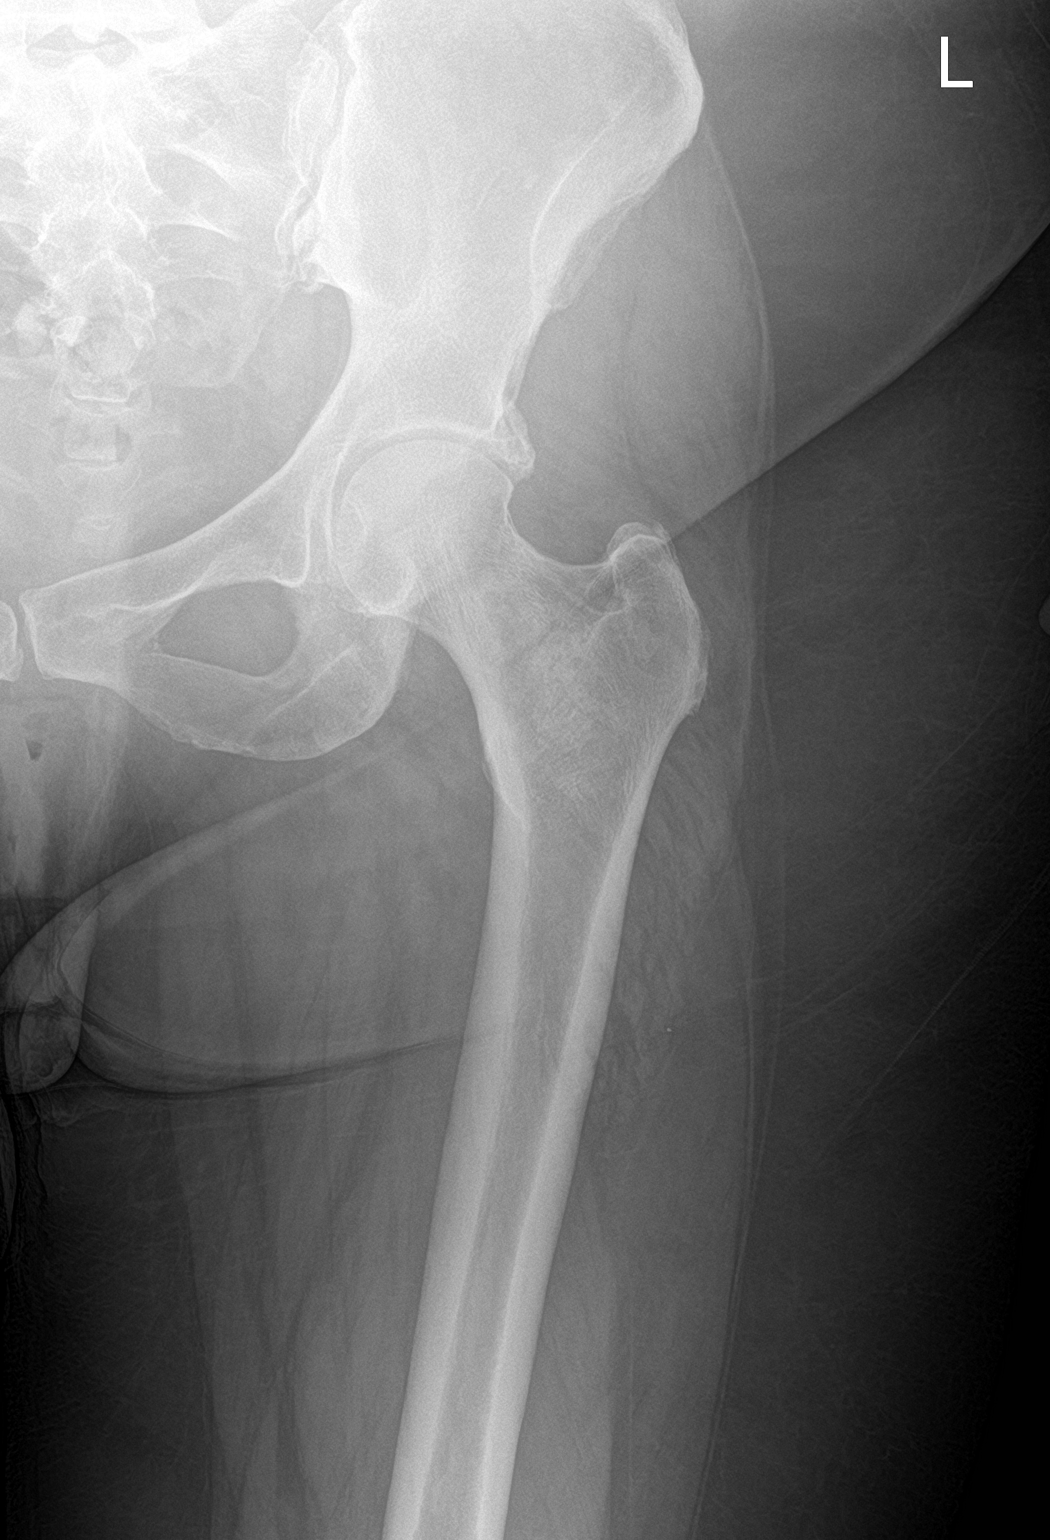

[pelvis ap]
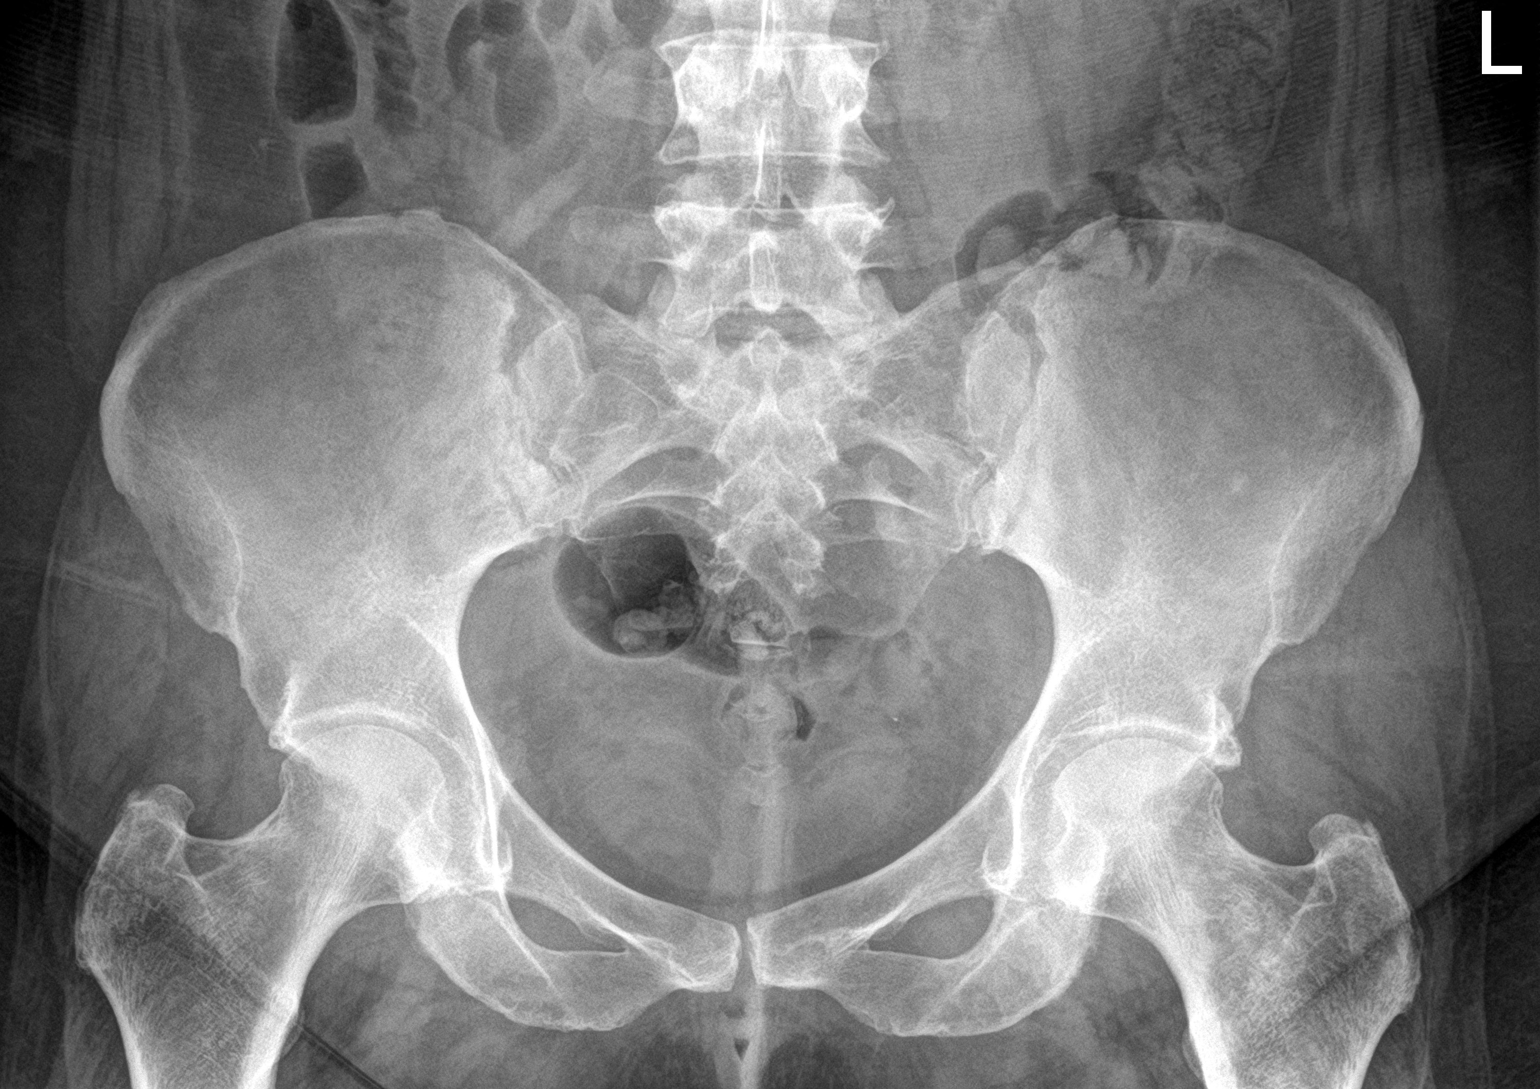

[2 of 2 positions shown; findings below may reference images not displayed]

FINDINGS: No fracture or dislocation is seen.

Bilateral hip joint spaces are preserved.

Mild degenerative changes of the lower lumbar spine.

2.8 cm sclerotic lesion in the left femoral neck, corresponding to
the patient's known osseous metastasis.
IMPRESSION: Osseous metastasis in the left femoral neck.

No fracture or dislocation is seen.

## 2020-11-26 MED ORDER — SODIUM CHLORIDE 0.9 % IV SOLN
10.0000 mg | Freq: Once | INTRAVENOUS | Status: DC
  Administered 2020-11-26: 10 mg via INTRAVENOUS
  Filled 2020-11-26: qty 10

## 2020-11-26 MED ORDER — DIPHENHYDRAMINE HCL 50 MG/ML IJ SOLN
INTRAMUSCULAR | Status: AC
  Filled 2020-11-26: qty 1

## 2020-11-26 MED ORDER — FAMOTIDINE IN NACL 20-0.9 MG/50ML-% IV SOLN
20.0000 mg | Freq: Once | INTRAVENOUS | Status: AC
  Administered 2020-11-26: 20 mg via INTRAVENOUS

## 2020-11-26 MED ORDER — SODIUM CHLORIDE 0.9% FLUSH
10.0000 mL | Freq: Once | INTRAVENOUS | Status: AC
  Administered 2020-11-26: 10 mL via INTRAVENOUS
  Filled 2020-11-26: qty 10

## 2020-11-26 MED ORDER — SODIUM CHLORIDE 0.9 % IV SOLN
Freq: Once | INTRAVENOUS | Status: AC
  Filled 2020-11-26: qty 250

## 2020-11-26 MED ORDER — DIPHENHYDRAMINE HCL 50 MG/ML IJ SOLN
25.0000 mg | Freq: Once | INTRAMUSCULAR | Status: AC
  Administered 2020-11-26: 25 mg via INTRAVENOUS

## 2020-11-26 MED ORDER — DEXAMETHASONE SODIUM PHOSPHATE 10 MG/ML IJ SOLN
4.0000 mg | Freq: Once | INTRAMUSCULAR | Status: AC
Start: 2020-11-26 — End: 2020-11-26
  Administered 2020-11-26: 4 mg via INTRAVENOUS

## 2020-11-26 MED ORDER — FAMOTIDINE IN NACL 20-0.9 MG/50ML-% IV SOLN
INTRAVENOUS | Status: AC
  Filled 2020-11-26: qty 50

## 2020-11-26 MED ORDER — DEXAMETHASONE SODIUM PHOSPHATE 10 MG/ML IJ SOLN
INTRAMUSCULAR | Status: AC
  Filled 2020-11-26: qty 1

## 2020-11-26 MED ORDER — SODIUM CHLORIDE 0.9 % IV SOLN
90.0000 mg/m2 | Freq: Once | INTRAVENOUS | Status: AC
  Administered 2020-11-26: 186 mg via INTRAVENOUS
  Filled 2020-11-26: qty 31

## 2020-11-26 MED ORDER — SODIUM CHLORIDE 0.9% FLUSH
10.0000 mL | INTRAVENOUS | Status: DC | PRN
  Administered 2020-11-26: 10 mL
  Filled 2020-11-26: qty 10

## 2020-11-26 MED ORDER — HEPARIN SOD (PORK) LOCK FLUSH 100 UNIT/ML IV SOLN
500.0000 [IU] | Freq: Once | INTRAVENOUS | Status: AC | PRN
  Administered 2020-11-26: 500 [IU]
  Filled 2020-11-26: qty 5

## 2020-11-26 MED ORDER — RIVAROXABAN 20 MG PO TABS: 20.0000 mg | ORAL_TABLET | Freq: Every day | ORAL | 3 refills | Status: AC

## 2020-11-26 NOTE — Progress Notes (Signed)
Administered infusion over 3hours per pharmacy and dosing, pt tolerated well, reported no flushing or reaction.

## 2020-11-26 NOTE — Patient Instructions (Signed)
[facility] Discharge Instructions for Patients Receiving Chemotherapy  Today you received the following chemotherapy agents: Paclitaxel (Taxol).  To help prevent nausea and vomiting after your treatment, we encourage you to take your nausea medication as directed.    If you develop nausea and vomiting that is not controlled by your nausea medication, call the clinic.   BELOW ARE SYMPTOMS THAT SHOULD BE REPORTED IMMEDIATELY:  *FEVER GREATER THAN 100.5 F  *CHILLS WITH OR WITHOUT FEVER  NAUSEA AND VOMITING THAT IS NOT CONTROLLED WITH YOUR NAUSEA MEDICATION  *UNUSUAL SHORTNESS OF BREATH  *UNUSUAL BRUISING OR BLEEDING  TENDERNESS IN MOUTH AND THROAT WITH OR WITHOUT PRESENCE OF ULCERS  *URINARY PROBLEMS  *BOWEL PROBLEMS  UNUSUAL RASH Items with * indicate a potential emergency and should be followed up as soon as possible.  Feel free to call the clinic should you have any questions or concerns. The clinic phone number is [phone].  Please show the CHEMO ALERT CARD at check-in to the Emergency Department and triage nurse.   

## 2020-11-26 NOTE — Progress Notes (Signed)
RN contacted [facility] to inquire about pathology report -   [facility] received prior authorization recently and should have final report by 2/28.  MD aware.

## 2020-11-26 NOTE — Progress Notes (Signed)
Bili 1.7. Per Dr. Jana Hakim ok to tx pt

## 2020-11-27 ENCOUNTER — Telehealth: Payer: Self-pay | Admitting: Oncology

## 2020-11-27 NOTE — Telephone Encounter (Signed)
No 2/16 los. No changes made to pt's schedule.  

## 2020-12-01 ENCOUNTER — Encounter: Payer: Self-pay | Admitting: Genetic Counselor

## 2020-12-02 ENCOUNTER — Encounter (HOSPITAL_COMMUNITY): Payer: Self-pay

## 2020-12-09 NOTE — Progress Notes (Signed)
[place]  [phone] [phone]     ID: Veronica [name] DOB: 1971-09-19  MR#: [id number]  VEL#:[id number]  Patient Care Team: Jamie Kato as PCP - General (Family Medicine) Mauro Kaufmann, RN as Oncology Nurse Navigator [name], RN as Oncology Nurse Navigator Erroll Luna, MD as Consulting Physician (General Surgery) Irasema Chalk, Virgie Dad, MD as Consulting Physician (Oncology) Kyung Rudd, MD as Consulting Physician (Radiation Oncology) Gaye Pollack, MD as Consulting Physician (Cardiothoracic Surgery) Serafina Mitchell, MD as Consulting Physician (Vascular Surgery) Benedict Needy, MD as Referring Physician (Hematology and Oncology) Chauncey Cruel, MD OTHER MD:  CHIEF COMPLAINT: Estrogen receptor positive breast cancer  CURRENT TREATMENT: Paclitaxel day 1 and day 8 of each 21-day cycle, pembrolizumab to be added day 8 of each cycle; zoledronate   INTERVAL HISTORY: Veronica [name] returns today for follow-up of her estrogen receptor positive breast cancer.  She started paclitaxel, given on days 1 and 8, every 21 days, on 11/20/2020.  We had also written for Keytruda to be given concomitantly but this was not approved by her insurance.  We have been able to obtain it through the kindness of the company and she will started with her day 8 treatment this cycle  Since her last visit, she met with Dr. Josie Dixon at [facility] for a second opinion.  Dr. Nathaneil Canary obtained a foundation 1 test and results of that are pending.  Her note states "We did also discuss the option of using CDK4/6 inhibitor + fulvestrant as she is technically ER positive, but as above agree with decision to proceed with chemotherapy first given clinical characteristics which suggest a behavior more consistent with TNBC. If good response to Taxol after next re-staging scans could then consider transition to CKD4/6 + fulvestrant.  "  The [facility]  on 11/25/2020 came back following her last visit showing: suspected metastatic lesions involving anterior left 4th rib, posterior left 8th rib, and proximal left femur at intertrochanteric region.  She then underwent left hip x-ray on 11/26/2020 showing: osseous metastasis in left femoral neck; no fracture or dislocation seen.  Lab Results  Component Value Date   CA2729 73.9 (H) 11/19/2020   CA2729 104.2 (H) 10/29/2020    REVIEW OF SYSTEMS: Hiya is tolerating treatment well.  The only side effect she reports is a little numbness in the middle of her tongue which lasted about 2 hours and occurred on day 2 of treatment.  She has had no problems with nausea or vomiting, no peripheral neuropathy, and has good energy.  She takes care of her grandchildren and does all her housework which is her baseline.  A detailed review of systems today was otherwise stable   COVID 19 VACCINATION STATUS: refuses vaccination, had COVID 29 September 2019   HISTORY OF CURRENT ILLNESS: From the original intake note:  Veronica [name] had routine screening mammography on 04/27/2019 showing a possible abnormality in the right breast. She underwent right diagnostic mammography with tomography and right breast ultrasonography at [facility] on 05/01/2019 showing: breast density category C; highly suspicious 1.7 cm irregular mass in the right breast at 10 o'clock, 8 cm from the nipple; indeterminate hypoechoic round mass in the right axillary tail/low axilla. Physical exam showed no suspicious lumps.  Accordingly on 05/02/2019 she proceeded to biopsy of the right breast area in question. The pathology from this procedure ([id number]) showed: invasive ductal carcinoma, grade 3; ductal carcinoma in situ;  lymphovascular invasion present. Prognostic indicators significant for: estrogen receptor, 30% positive with moderate staining intensity, and progesterone receptor, 30% positive with strong staining intensity. Proliferation  marker Ki67 at 20%. HER2 negative by immunohistochemistry (1+).  The second "satellite" lesion was a lymph node which was also positive.  The patient's subsequent history is as detailed below.   PAST MEDICAL HISTORY: Past Medical History:  Diagnosis Date  . Cancer [facility]) 2020   breast Cancer, 2022 liver mets  . Chronic low back pain with left-sided sciatica   . Family history of leukemia   . Family history of stomach cancer   . Hypertension   . Personal history of chemotherapy   . Personal history of radiation therapy    finished june '21    PAST SURGICAL HISTORY: Past Surgical History:  Procedure Laterality Date  . APPLICATION OF WOUND VAC N/A 10/16/2019   Procedure: APPLICATION OF WOUND VAC;  Surgeon: Gaye Pollack, MD;  Location: [facility] OR;  Service: Vascular;  Laterality: N/A;  . BREAST BIOPSY Right 05/02/2019   right clips X2  . BREAST LUMPECTOMY Right    06/2019  . BREAST LUMPECTOMY WITH RADIOACTIVE SEED AND SENTINEL LYMPH NODE BIOPSY Right 06/12/2019   Procedure: RIGHT BREAST RADIOACTIVE SEED LUMPECTOMY  AND RIGHT AXILLARY SEED TARGETED LYMPH NODE AND AXILLARY SENTINEL LYMPH NODE MAPPING;  Surgeon: Erroll Luna, MD;  Location: [place];  Service: General;  Laterality: Right;  PEC BLOCK  . C/S x2  '98 '03  . CENTRAL VENOUS CATHETER INSERTION  10/02/2019   Procedure: Insertion Central Line Adult;  Surgeon: Serafina Mitchell, MD;  Location: [place];  Service: Vascular;;  . CESAREAN SECTION    . CHOLECYSTECTOMY    . I & D EXTREMITY N/A 10/16/2019   Procedure: DEBRIDEMENT of DEHISCED MIDLINE SURGICAL INCISION;  Surgeon: Gaye Pollack, MD;  Location: [facility];  Service: Vascular;  Laterality: N/A;  . IR IMAGING GUIDED PORT INSERTION  11/24/2020  . LAPAROSCOPIC ASSISTED VAGINAL HYSTERECTOMY  05/17/2011   Procedure: LAPAROSCOPIC ASSISTED VAGINAL HYSTERECTOMY;  Surgeon: Sharene Butters;  Location: [facility] [facility];  Service: Gynecology;  Laterality: N/A;  Laparoscopic Assisted  Vaginal Hysterectomy With Lysis Of Adhesions  . PERICARDIAL WINDOW N/A 10/02/2019   Procedure: Pericardial Window;  Surgeon: Serafina Mitchell, MD;  Location: [place];  Service: Vascular;  Laterality: N/A;  . PORT-A-CATH REMOVAL  10/02/2019   Procedure: Removal Port-A-Cath;  Surgeon: Serafina Mitchell, MD;  Location: [place];  Service: Vascular;;  . PORTACATH PLACEMENT Right 06/12/2019   Procedure: INSERTION PORT-A-CATH WITH ULTRASOUND;  Surgeon: Erroll Luna, MD;  Location: [place];  Service: General;  Laterality: Right;  . RE-EXCISION OF BREAST LUMPECTOMY Right 07/17/2019   Procedure: RE-EXCISION OF RIGHT BREAST LUMPECTOMY;  Surgeon: Erroll Luna, MD;  Location: [place];  Service: General;  Laterality: Right;  . TUBAL LIGATION    . ULTRASOUND GUIDANCE FOR VASCULAR ACCESS  10/02/2019   Procedure: Ultrasound Guidance For Vascular Access;  Surgeon: Serafina Mitchell, MD;  Location: [facility] OR;  Service: Vascular;;  . VENOGRAM N/A 10/02/2019   Procedure: Central VENOGRAM;  Surgeon: Serafina Mitchell, MD;  Location: [facility] OR;  Service: Vascular;  Laterality: N/A;    FAMILY HISTORY: Family History  Problem Relation Age of Onset  . Arthritis Mother   . COPD Mother   . Hypertension Mother   . Hyperlipidemia Mother   . Leukemia Brother 51  . Stomach cancer Maternal Grandmother  late 70s  Patient's parents are living (as of September 2020). Her father is 65 and her mother is 79 as of 04/2019. The patient denies a family hx of breast or ovarian cancer. She has 7 siblings, 6 brothers and 1 sister. She reports her maternal grandmother was diagnosed with stomach cancer at age 66. Her brother was diagnosed with leukemia at age 47.   GYNECOLOGIC HISTORY:  No LMP recorded. Patient has had a hysterectomy. Menarche: 50 years old Age at first live birth: 51 years old GX P 2 (+1 stillborn) LMP age 91-42 Contraceptive: unsure, maybe for a year at age 24. HRT  no Hysterectomy? Yes, 05/2011 BSO? 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 She is married. Husband Sheppard Plumber") is a Hydrologist for a telephone company. She lives at home with her husband and two daughters. Daughter Suszanne Conners, age 82, is a Scientist, water quality. Daughter Jarrett Soho, age 87, is a Ship broker.     ADVANCED DIRECTIVES: Husband Heath Lark is her HCPOA.   HEALTH MAINTENANCE: Social History   Tobacco Use  . Smoking status: Never Smoker  . Smokeless tobacco: Never Used  Vaping Use  . Vaping Use: Never used  Substance Use Topics  . Alcohol use: Not Currently    Alcohol/week: 2.0 standard drinks    Types: 2 Standard drinks or equivalent per week    Comment: "2-3 a week"  . Drug use: No     Colonoscopy: n/a  PAP: 02/2018  Bone density: n/a   No Known Allergies  Current Outpatient Medications  Medication Sig Dispense Refill  . Cholecalciferol (VITAMIN D3) 25 MCG (1000 UT) CHEW Chew 1 tablet by mouth daily.     Marland Kitchen gabapentin (NEURONTIN) 300 MG capsule Take 1 capsule (300 mg total) by mouth at bedtime as needed. 90 capsule 4  . metoprolol tartrate (LOPRESSOR) 25 MG tablet Take 1 tablet (25 mg total) by mouth 2 (two) times daily. 60 tablet 0  . ondansetron (ZOFRAN) 8 MG tablet Take 1 tablet (8 mg total) by mouth 2 (two) times daily as needed (Nausea or vomiting). 30 tablet 1  . prochlorperazine (COMPAZINE) 10 MG tablet Take 1 tablet (10 mg total) by mouth every 6 (six) hours as needed (Nausea or vomiting). 90 tablet 1  . rivaroxaban (XARELTO) 20 MG TABS tablet Take 1 tablet (20 mg total) by mouth daily with supper. 90 tablet 3   No current facility-administered medications for this visit.   Facility-Administered Medications Ordered in Other Visits  Medication Dose Route Frequency Provider Last Rate Last Admin  . sodium chloride flush (NS) 0.9 % injection 10 mL  10 mL Intracatheter PRN Anuel Sitter, Virgie Dad, MD   10 mL at 12/10/20 1423     OBJECTIVE: white woman examined in the infusion area  There were no vitals filed for this visit. Wt Readings from Last 3 Encounters:  12/10/20 222 lb 12 oz (101 kg)  11/26/20 215 lb 6.4 oz (97.7 kg)  11/19/20 215 lb 6.4 oz (97.7 kg)   There is no height or weight on file to calculate BMI.    For vitals on the 12/10/2020 visit please see the infusion area flowsheet  ECOG FS:1 - Symptomatic but completely ambulatory  Sclerae unicteric, EOMs intact Wearing a mask Lungs no rales or rhonchi Heart regular rate and rhythm Abd soft, nontender, positive bowel sounds Neuro: nonfocal, well oriented, appropriate affect Breasts: Deferred   LAB RESULTS:  CMP     Component Value  Date/Time   NA 142 12/10/2020 0801   K 4.8 12/10/2020 0801   CL 110 12/10/2020 0801   CO2 23 12/10/2020 0801   GLUCOSE 127 (H) 12/10/2020 0801   BUN 19 12/10/2020 0801   CREATININE 0.79 12/10/2020 0801   CREATININE 0.75 04/24/2020 1035   CALCIUM 9.2 12/10/2020 0801   PROT 6.4 (L) 12/10/2020 0801   ALBUMIN 3.5 12/10/2020 0801   AST 24 12/10/2020 0801   AST 23 04/24/2020 1035   ALT 32 12/10/2020 0801   ALT 20 04/24/2020 1035   ALKPHOS 84 12/10/2020 0801   BILITOT 0.9 12/10/2020 0801   BILITOT 1.1 04/24/2020 1035   GFRNONAA >60 12/10/2020 0801   GFRNONAA >60 04/24/2020 1035   GFRAA >60 04/24/2020 1035    No results found for: TOTALPROTELP, ALBUMINELP, A1GS, A2GS, BETS, BETA2SER, GAMS, MSPIKE, SPEI  No results found for: KPAFRELGTCHN, LAMBDASER, KAPLAMBRATIO  Lab Results  Component Value Date   WBC 2.7 (L) 12/10/2020   NEUTROABS 1.2 (L) 12/10/2020   HGB 12.7 12/10/2020   HCT 38.2 12/10/2020   MCV 88.6 12/10/2020   PLT 202 12/10/2020   No results found for: LABCA2  No components found for: PPIRJJ884  No results for input(s): INR in the last 168 hours.  No results found for: LABCA2  No results found for: CAN199  No results found for: CAN125  No results found for: ZYS063  Lab  Results  Component Value Date   CA2729 73.9 (H) 11/19/2020    No components found for: HGQUANT  Lab Results  Component Value Date   CEA1 2.45 10/29/2020   /  CEA ([facility]In [facility])  Date Value Ref Range Status  10/29/2020 2.45 0.00 - 5.00 ng/mL Final    Comment:    (NOTE) This test was performed using Architect's Chemiluminescent Microparticle Immunoassay. Values obtained from different assay methods cannot be used interchangeably. Please note that 5-10% of patients who smoke may see CEA levels up to 6.9 ng/mL. Performed at [facility], [place] [address] [place], [place] [address]      No results found for: AFPTUMOR  No results found for: [place]  No results found for: HGBA, HGBA2QUANT, HGBFQUANT, HGBSQUAN (Hemoglobinopathy evaluation)   Lab Results  Component Value Date   LDH 170 09/30/2019    No results found for: IRON, TIBC, IRONPCTSAT (Iron and TIBC)  Lab Results  Component Value Date   FERRITIN 498 (H) 10/19/2019    Urinalysis    Component Value Date/Time   COLORURINE YELLOW 08/28/2019 2326   APPEARANCEUR CLOUDY (A) 08/28/2019 2326   LABSPEC 1.013 08/28/2019 2326   PHURINE 5.0 08/28/2019 2326   GLUCOSEU NEGATIVE 08/28/2019 2326   HGBUR MODERATE (A) 08/28/2019 2326   BILIRUBINUR NEGATIVE 08/28/2019 2326   KETONESUR NEGATIVE 08/28/2019 2326   PROTEINUR NEGATIVE 08/28/2019 2326   NITRITE NEGATIVE 08/28/2019 2326   LEUKOCYTESUR MODERATE (A) 08/28/2019 2326    STUDIES: CT Head Wo Contrast  Result Date: 11/25/2020 CLINICAL DATA:  Evaluate for cerebral hemorrhage or Mets. Breast cancer. EXAM: CT HEAD WITHOUT CONTRAST TECHNIQUE: Contiguous axial images were obtained from the base of the skull through the vertex without intravenous contrast. COMPARISON:  CT head 07/16/2020. FINDINGS: Brain: No evidence of acute large vascular territory infarction, hemorrhage, hydrocephalus, extra-axial collection or mass lesion/mass effect. Vascular:  No hyperdense vessel identified. Skull: No acute fracture. Sinuses/Orbits: Air-fluid level in left maxillary and left sphenoid sinuses. Unremarkable orbits. Other: No mastoid effusion. IMPRESSION: 1. No evidence of acute abnormality. An  MRI with contrast could provide more sensitive evaluation for metastatic disease if clinically indicated. 2. Nonspecific left maxillary and left sphenoid sinus air-fluid levels. Correlate with signs/symptoms of sinusitis. Electronically Signed   By: Margaretha Sheffield MD   On: 11/25/2020 10:10   NM Bone Scan Whole Body  Result Date: 11/26/2020 CLINICAL DATA:  RIGHT breast cancer post lumpectomy and sentinel lymph node biopsy EXAM: NUCLEAR MEDICINE WHOLE BODY BONE SCAN TECHNIQUE: Whole body anterior and posterior images were obtained approximately 3 hours after intravenous injection of radiopharmaceutical. RADIOPHARMACEUTICALS:  21.2 mCi Technetium-40mMDP IV COMPARISON:  None Radiographic correlation: CT angio chest 10/07/2020 FINDINGS: Foci of abnormal tracer uptake at anterior LEFT fourth rib and posterior LEFT eighth rib. Anterior fourth rib lesion corresponds to subtle area of sclerosis on prior CT series 11, image 65. Posterior LEFT eighth rib lesion corresponds to a lytic lesion on CT series 11 image 86. Large area of increased tracer uptake at the intertrochanteric region proximal LEFT femur extending into neck, suspicious for metastasis recommend radiographic correlation. Uptake at shoulders, sternoclavicular joints, hips, typically degenerative. Uptake at RIGHT lateral aspect of lumbar spine at L3, indeterminate, could be degenerative or metastatic. Uptake at RIGHT mandible, typically dental in origin. No additional sites of abnormal tracer accumulation. Increased tracer uptake in the RIGHT breast question related to edema / post therapy changes. Otherwise expected urinary tract and soft tissue distribution of tracer. IMPRESSION: Suspected metastatic lesions involving the  anterior LEFT fourth rib, posterior LEFT eighth rib, and proximal LEFT femur at the intertrochanteric region. Recommend LEFT hip radiographs to exclude lesion at risk for pathologic fracture. These results will be called to the ordering clinician or representative by the Radiologist Assistant, and communication documented in the PACS or [employer] Electronically Signed   By: MLavonia DanaM.D.   On: 11/26/2020 08:45   DG Hip Unilat W or W/O Pelvis 1 View Left  Result Date: 11/28/2020 CLINICAL DATA:  Bone metastasis EXAM: DG HIP (WITH OR WITHOUT PELVIS) 1V*L* COMPARISON:  None. FINDINGS: No fracture or dislocation is seen. Bilateral hip joint spaces are preserved. Mild degenerative changes of the lower lumbar spine. 2.8 cm sclerotic lesion in the left femoral neck, corresponding to the patient's known osseous metastasis. IMPRESSION: Osseous metastasis in the left femoral neck. No fracture or dislocation is seen. Electronically Signed   By: SJulian HyM.D.   On: 11/28/2020 12:21   IR IMAGING GUIDED PORT INSERTION  Result Date: 11/24/2020 INDICATION: 50year old female with breast cancer metastatic to the liver. She presents for placement of a left-sided port catheter. 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 EXAM: IMPLANTED PORT A CATH PLACEMENT WITH ULTRASOUND AND FLUOROSCOPIC GUIDANCE MEDICATIONS: None. ANESTHESIA/SEDATION: Versed 2 mg IV; Fentanyl 100 mcg IV; Moderate Sedation Time:  21 minutes The patient was continuously monitored during the procedure by the interventional radiology nurse under my direct supervision. FLUOROSCOPY TIME:  0 minutes, 36 seconds (6 mGy) COMPLICATIONS: None immediate. PROCEDURE: The left neck and chest was prepped with chlorhexidine, and draped in the usual sterile fashion using maximum barrier technique (cap and mask, sterile gown, sterile gloves, large sterile sheet, hand hygiene and cutaneous antiseptic). Local anesthesia was attained by  infiltration with 1% lidocaine with epinephrine. Ultrasound demonstrated patency of the left internal jugular vein vein, and this was documented with an image. Under real-time ultrasound guidance, this vein was accessed with a 21 gauge micropuncture needle and image documentation was performed. A small dermatotomy was made at the access site  with an 11 scalpel. A 0.018" wire was advanced into the SVC and the access needle exchanged for a 61F micropuncture vascular sheath. The 0.018" wire was then removed and a 0.035" wire advanced into the IVC. An appropriate location for the subcutaneous reservoir was selected below the clavicle and an incision was made through the skin and underlying soft tissues. The subcutaneous tissues were then dissected using a combination of blunt and sharp surgical technique and a pocket was formed. A single lumen power injectable portacatheter was then tunneled through the subcutaneous tissues from the pocket to the dermatotomy and the port reservoir placed within the subcutaneous pocket. The venous access site was then serially dilated and a peel away vascular sheath placed over the wire. The wire was removed and the port catheter advanced into position under fluoroscopic guidance. The catheter tip is positioned in the superior cavoatrial junction. This was documented with a spot image. The portacatheter was then tested and found to flush and aspirate well. The port was flushed with saline followed by 100 units/mL heparinized saline. The pocket was then closed in two layers using first subdermal inverted interrupted absorbable sutures followed by a running subcuticular suture. The epidermis was then sealed with Dermabond. The dermatotomy at the venous access site was also closed with Dermabond. IMPRESSION: Successful placement of a left IJ approach Power Port with ultrasound and fluoroscopic guidance. The catheter is ready for use. Electronically Signed   By: Jacqulynn Cadet M.D.   On:  11/24/2020 16:16     ELIGIBLE FOR AVAILABLE RESEARCH PROTOCOL: AET  ASSESSMENT: 50 y.o. [place], [place] woman status post right breast upper outer quadrant biopsy 05/01/2019 for a clinical T1c N1, stage IIA invasive ductal carcinoma, grade 3, estrogen and progesterone receptor positive, HER-2 nonamplified, with an MIB-1-1 of 20%.  (a) mass in the axillary tail was a positive lymph node  (1) MammaPrint obtained from the original biopsy shows a high risk luminal B tumor and predicts a 5-year disease-free survival of 91% in her case  (2) genetics testing 05/09/2019 through the Common Hereditary Gene Panel offered by [facility] found no deleterious mutations in APC, ATM, AXIN2, BARD1, BMPR1A, BRCA1, BRCA2, BRIP1, CDH1, CDK4, CDKN2A (p14ARF), CDKN2A (p16INK4a), CHEK2, CTNNA1, DICER1, EPCAM (Deletion/duplication testing only), GREM1 (promoter region deletion/duplication testing only), KIT, MEN1, MLH1, MSH2, MSH3, MSH6, MUTYH, NBN, NF1, NHTL1, PALB2, PDGFRA, PMS2, POLD1, POLE, PTEN, RAD50, RAD51C, RAD51D, RNF43, SDHB, SDHC, SDHD, SMAD4, SMARCA4. STK11, TP53, TSC1, TSC2, and VHL.  The following genes were evaluated for sequence changes only: SDHA and HOXB13 c.251G>A variant only.   (a) A variant of uncertain significance (VUS) was detected in one of her MSH6 genes (c.831A>C).  (3) status post right lumpectomy and sentinel lymph node sampling 06/12/2019 for a pT2 pN1, stage IIA invasive ductal carcinoma, grade 2, with positive margins  (a) a total of 4 sentinel lymph nodes removed, one positive (with ECE), ine itc  (b) margin clearance 04/19/2019 successful (medial margin close but negative for DCIS)  (4) adjuvant chemotherapy consisting of doxorubicin and cyclophosphamide in dose dense fashion x4 starting 07/10/2019, completed 09/24/2019; planned weekly paclitaxel x12 omitted   (a) echo 06/26/2019 shows an EF of 60-65%  (b echo on 09/19/2019 shows an EF of 60-65%  (c) chemotherapy stopped after four cycles  of doxorubicin and cyclophosphamide due to #5 below  (5) Multiple VTE/PE documented 10/02/2019, on intravenous heparin initially, then Xarelto started 10/19/2019  (a) presented with SVC syndrome and bilateral pulmonary emboli on 09/28/2019  (b) s/p  SVC thrombectomy and port removal 54/62/7035 complicated by hemopericardium and tamponade, necessitating pericardial window placement   (c) postop course complicated by wound dehiscence requiring wound VAC  (c) Doppler 10/03/2019 showed right lower extremity DVT  (d) CT angio and Doppler of both upper extremities 10/13/2019 found persistent acute bilateral pulmonary emboli, persistent but improved SVC thrombus, bilateral pleural effusions, and right lower lobe collapse. Bilateral upper extremity DVTs also noted  (e) Dopplers upper extremity 11/19/2019 showed clots cleared on the left, chronic DVT right internal jugular and axillary veins  (f) CT angio of the chest 10/07/2020 shows resolution of earlier pleural effusions and no evidence of active embolism (but see #9 below)  (6) COVID-19 infection documented 09/27/2020, status post remdesivir  (7) adjuvant radiation 12/24/19 - 02/06/20 Site/dose:   The patient initially received a dose of 50.4 Gy in 28 fractions to the breast and SCLV region using a 4-field approach. This was delivered using a 3-D conformal technique. The patient then received a boost to the seroma. This delivered an additional 10 Gy in 5 fractions using a 3-field photon boost technique. The total dose was 60.4 Gy.  (8) anastrozole started mid May 2021  (a) [facility] and estradiol obtained 11/15/2019 consistent with menopause  (b) not a good candidate for tamoxifen given history of DVT/PE above  (c) bone density 07/31/2020 normal (T score equals 0.1).  METASTATIC DISEASE: Jan 2022 (9) CT angio of the chest 12/28/2021shows new liver lesions (not seen on CT angio January 2021)  (a) MRI 10/22/2020 shows multiple liver lesions, the largest  2.5 cm  (b)   liver biopsy 11/03/2020 confirms metastatic carcinoma estrogen receptor 10% positive with weak staining intensity, HER-2 and progesterone receptor negative.  (c) bone scan 11/25/2020 shows left femoral lesion as well as other lesions  (d) non-contrast head CT 11/25/2020 negative except for sinusitis  (e) CA 27-29 is informative (was 104.1 on 10/29/2020)  (10) foundation 1 study requested on 11/03/2020 liver biopsy (also requested by [facility])  (11) started paclitaxel 11/19/2018 2012, repeated day 1 day 8 of every 21-day cycle  (a) pembrolizumab to be added 12/17/2020  (12) zoledronate added 12/10/2020, repeated every 12 weeks   PLAN:  Anysha did well with her first cycle of paclitaxel and is starting the second 1 today.  She is also receiving zoledronate for the first time today.  She has a good understanding of the possible toxicities side effects and complications of that medication.  We have been able to obtain pembrolizumab/Keytruda for her through courtesy of the company.  That will be added next week.  I have added for future reference the suggestions from Dr.Douglas at [facility] whom the patient saw for second opinion in my note above; please refer to that.  The plan at this point however is to complete 4 cycles of the current treatment and restage  Mariya knows to call for any other issue that may develop before the next visit  Total encounter time 25 minutes.Sarajane Jews C. Niccole Witthuhn, MD Medical Oncology and Hematology [facility] [place], [place] [address] Tel. [phone]    Fax. [phone]   I, Wilburn Mylar, am acting as scribe for Dr. Virgie Dad. Faatimah Spielberg.  I, Lurline Del MD, have reviewed the above documentation for accuracy and completeness, and I agree with the above.   *Total Encounter Time as defined by the Centers for Medicare and Medicaid Services includes, in addition to the face-to-face time of a patient visit  (  documented in the note above) non-face-to-face time: obtaining and reviewing outside history, ordering and reviewing medications, tests or procedures, care coordination (communications with other health care professionals or caregivers) and documentation in the medical record.

## 2020-12-10 ENCOUNTER — Inpatient Hospital Stay (HOSPITAL_BASED_OUTPATIENT_CLINIC_OR_DEPARTMENT_OTHER): Payer: 59 | Admitting: Oncology

## 2020-12-10 ENCOUNTER — Other Ambulatory Visit: Payer: Self-pay

## 2020-12-10 ENCOUNTER — Inpatient Hospital Stay: Payer: 59

## 2020-12-10 ENCOUNTER — Inpatient Hospital Stay: Payer: 59 | Attending: Oncology

## 2020-12-10 ENCOUNTER — Other Ambulatory Visit: Payer: Self-pay | Admitting: Pharmacist

## 2020-12-10 ENCOUNTER — Other Ambulatory Visit: Payer: Self-pay | Admitting: Oncology

## 2020-12-10 VITALS — BP 114/57 | HR 66 | Temp 97.9°F | Resp 20 | Ht 61.0 in | Wt 222.8 lb

## 2020-12-10 DIAGNOSIS — Z170 Estrogen receptor positive status [ER+]: Secondary | ICD-10-CM | POA: Insufficient documentation

## 2020-12-10 DIAGNOSIS — C773 Secondary and unspecified malignant neoplasm of axilla and upper limb lymph nodes: Secondary | ICD-10-CM | POA: Diagnosis not present

## 2020-12-10 DIAGNOSIS — Z79899 Other long term (current) drug therapy: Secondary | ICD-10-CM | POA: Insufficient documentation

## 2020-12-10 DIAGNOSIS — C7951 Secondary malignant neoplasm of bone: Secondary | ICD-10-CM | POA: Diagnosis not present

## 2020-12-10 DIAGNOSIS — C787 Secondary malignant neoplasm of liver and intrahepatic bile duct: Secondary | ICD-10-CM

## 2020-12-10 DIAGNOSIS — C50411 Malignant neoplasm of upper-outer quadrant of right female breast: Secondary | ICD-10-CM

## 2020-12-10 DIAGNOSIS — Z5111 Encounter for antineoplastic chemotherapy: Secondary | ICD-10-CM | POA: Diagnosis present

## 2020-12-10 DIAGNOSIS — Z5112 Encounter for antineoplastic immunotherapy: Secondary | ICD-10-CM | POA: Diagnosis not present

## 2020-12-10 DIAGNOSIS — Z7189 Other specified counseling: Secondary | ICD-10-CM

## 2020-12-10 DIAGNOSIS — Z95828 Presence of other vascular implants and grafts: Secondary | ICD-10-CM

## 2020-12-10 LAB — CBC WITH DIFFERENTIAL/PLATELET
Abs Immature Granulocytes: 0.08 10*3/uL — ABNORMAL HIGH (ref 0.00–0.07)
Basophils Absolute: 0 10*3/uL (ref 0.0–0.1)
Basophils Relative: 1 %
Eosinophils Absolute: 0.1 10*3/uL (ref 0.0–0.5)
Eosinophils Relative: 3 %
HCT: 38.2 % (ref 36.0–46.0)
Hemoglobin: 12.7 g/dL (ref 12.0–15.0)
Immature Granulocytes: 3 %
Lymphocytes Relative: 32 %
Lymphs Abs: 0.9 10*3/uL (ref 0.7–4.0)
MCH: 29.5 pg (ref 26.0–34.0)
MCHC: 33.2 g/dL (ref 30.0–36.0)
MCV: 88.6 fL (ref 80.0–100.0)
Monocytes Absolute: 0.5 10*3/uL (ref 0.1–1.0)
Monocytes Relative: 19 %
Neutro Abs: 1.2 10*3/uL — ABNORMAL LOW (ref 1.7–7.7)
Neutrophils Relative %: 42 %
Platelets: 202 10*3/uL (ref 150–400)
RBC: 4.31 MIL/uL (ref 3.87–5.11)
RDW: 13.9 % (ref 11.5–15.5)
WBC: 2.7 10*3/uL — ABNORMAL LOW (ref 4.0–10.5)
nRBC: 0 % (ref 0.0–0.2)

## 2020-12-10 LAB — COMPREHENSIVE METABOLIC PANEL
ALT: 32 U/L (ref 0–44)
AST: 24 U/L (ref 15–41)
Albumin: 3.5 g/dL (ref 3.5–5.0)
Alkaline Phosphatase: 84 U/L (ref 38–126)
Anion gap: 9 (ref 5–15)
BUN: 19 mg/dL (ref 6–20)
CO2: 23 mmol/L (ref 22–32)
Calcium: 9.2 mg/dL (ref 8.9–10.3)
Chloride: 110 mmol/L (ref 98–111)
Creatinine, Ser: 0.79 mg/dL (ref 0.44–1.00)
GFR, Estimated: >60 mL/min (ref >60)
Glucose, Bld: 127 mg/dL — ABNORMAL HIGH (ref 70–99)
Potassium: 4.8 mmol/L (ref 3.5–5.1)
Sodium: 142 mmol/L (ref 135–145)
Total Bilirubin: 0.9 mg/dL (ref 0.3–1.2)
Total Protein: 6.4 g/dL — ABNORMAL LOW (ref 6.5–8.1)

## 2020-12-10 MED ORDER — DEXAMETHASONE SODIUM PHOSPHATE 10 MG/ML IJ SOLN
INTRAMUSCULAR | Status: AC
  Filled 2020-12-10: qty 1

## 2020-12-10 MED ORDER — HEPARIN SOD (PORK) LOCK FLUSH 100 UNIT/ML IV SOLN
500.0000 [IU] | Freq: Once | INTRAVENOUS | Status: AC | PRN
  Administered 2020-12-10: 500 [IU]
  Filled 2020-12-10: qty 5

## 2020-12-10 MED ORDER — DEXAMETHASONE SODIUM PHOSPHATE 10 MG/ML IJ SOLN
4.0000 mg | Freq: Once | INTRAMUSCULAR | Status: AC
  Administered 2020-12-10: 4 mg via INTRAVENOUS

## 2020-12-10 MED ORDER — ZOLEDRONIC ACID 4 MG/100ML IV SOLN
INTRAVENOUS | Status: AC
  Filled 2020-12-10: qty 100

## 2020-12-10 MED ORDER — SODIUM CHLORIDE 0.9 % IV SOLN
Freq: Once | INTRAVENOUS | Status: AC
  Filled 2020-12-10: qty 250

## 2020-12-10 MED ORDER — ZOLEDRONIC ACID 4 MG/100ML IV SOLN
4.0000 mg | Freq: Once | INTRAVENOUS | Status: AC
  Administered 2020-12-10: 4 mg via INTRAVENOUS

## 2020-12-10 MED ORDER — DIPHENHYDRAMINE HCL 50 MG/ML IJ SOLN
25.0000 mg | Freq: Once | INTRAMUSCULAR | Status: AC
  Administered 2020-12-10: 25 mg via INTRAVENOUS

## 2020-12-10 MED ORDER — FAMOTIDINE IN NACL 20-0.9 MG/50ML-% IV SOLN
INTRAVENOUS | Status: AC
  Filled 2020-12-10: qty 50

## 2020-12-10 MED ORDER — SODIUM CHLORIDE 0.9 % IV SOLN
90.0000 mg/m2 | Freq: Once | INTRAVENOUS | Status: AC
  Administered 2020-12-10: 186 mg via INTRAVENOUS
  Filled 2020-12-10: qty 31

## 2020-12-10 MED ORDER — DIPHENHYDRAMINE HCL 50 MG/ML IJ SOLN
INTRAMUSCULAR | Status: AC
  Filled 2020-12-10: qty 1

## 2020-12-10 MED ORDER — SODIUM CHLORIDE 0.9% FLUSH
10.0000 mL | INTRAVENOUS | Status: DC | PRN
  Administered 2020-12-10: 10 mL
  Filled 2020-12-10: qty 10

## 2020-12-10 MED ORDER — FAMOTIDINE IN NACL 20-0.9 MG/50ML-% IV SOLN
20.0000 mg | Freq: Once | INTRAVENOUS | Status: AC
  Administered 2020-12-10: 20 mg via INTRAVENOUS

## 2020-12-10 MED ORDER — SODIUM CHLORIDE 0.9 % IV SOLN
200.0000 mg | Freq: Once | INTRAVENOUS | Status: DC
  Filled 2020-12-10: qty 8

## 2020-12-10 NOTE — Progress Notes (Signed)
..  The following Medication: Beryle Flock has been approved thru [employer] as Risk manager. Enrollment period is 12/09/2020 to 12/09/2021.  Assistance ID: [id number]. Reason for Assistance: Insurance Denial / Off Label Use First DOS: 12/17/2020.  Marland KitchenJuan Quam, CPhT IV Drug Replacement Specialist [place] Phone: [phone]

## 2020-12-10 NOTE — Progress Notes (Signed)
Ok to treat with [place] today per MD Magrinat. Pt reports no fevers, s/s of infection

## 2020-12-10 NOTE — Patient Instructions (Signed)
[facility] Discharge Instructions for Patients Receiving Chemotherapy  Today you received the following chemotherapy agents:  Taxol.  To help prevent nausea and vomiting after your treatment, we encourage you to take your nausea medication as directed.   If you develop nausea and vomiting that is not controlled by your nausea medication, call the clinic.   BELOW ARE SYMPTOMS THAT SHOULD BE REPORTED IMMEDIATELY:  *FEVER GREATER THAN 100.5 F  *CHILLS WITH OR WITHOUT FEVER  NAUSEA AND VOMITING THAT IS NOT CONTROLLED WITH YOUR NAUSEA MEDICATION  *UNUSUAL SHORTNESS OF BREATH  *UNUSUAL BRUISING OR BLEEDING  TENDERNESS IN MOUTH AND THROAT WITH OR WITHOUT PRESENCE OF ULCERS  *URINARY PROBLEMS  *BOWEL PROBLEMS  UNUSUAL RASH Items with * indicate a potential emergency and should be followed up as soon as possible.  Feel free to call the clinic should you have any questions or concerns. The clinic phone number is [phone].  Please show the CHEMO ALERT CARD at check-in to the Emergency Department and triage nurse.   

## 2020-12-11 ENCOUNTER — Telehealth: Payer: Self-pay | Admitting: Oncology

## 2020-12-11 LAB — CANCER ANTIGEN 27.29: CA 27.29: 52.6 U/mL — ABNORMAL HIGH (ref 0.0–38.6)

## 2020-12-11 NOTE — Telephone Encounter (Signed)
No 3/2 los. No changes made to pt's schedule.

## 2020-12-16 NOTE — Progress Notes (Signed)
[place]  [phone] [phone]     ID: LILLEE MOONEYHAN DOB: 15-Aug-1971  MR#: [id number]  EYC#:[id number]  Patient Care Team: Jamie Kato as PCP - General (Family Medicine) Mauro Kaufmann, RN as Oncology Nurse Navigator [name], RN as Oncology Nurse Navigator Erroll Luna, MD as Consulting Physician (General Surgery) Francesco Provencal, Virgie Dad, MD as Consulting Physician (Oncology) Kyung Rudd, MD as Consulting Physician (Radiation Oncology) Gaye Pollack, MD as Consulting Physician (Cardiothoracic Surgery) Serafina Mitchell, MD as Consulting Physician (Vascular Surgery) Benedict Needy, MD as Referring Physician (Hematology and Oncology) Chauncey Cruel, MD OTHER MD:  CHIEF COMPLAINT: Estrogen receptor positive breast cancer  CURRENT TREATMENT: Paclitaxel day 1 and day 8 of each 21-day cycle, pembrolizumab given with day 8 of each cycle; zoledronate every 12 weeks   INTERVAL HISTORY: April returns today for follow-up and treatment of her estrogen receptor positive breast cancer.  Her husband was unable to come today.  He is back at work full-time  She started paclitaxel, given on days 1 and 8, every 21 days, on 11/20/2020.  We had also written for Keytruda to be given concomitantly but this was not approved by her insurance.  We have been able to obtain it through the kindness of the company and she will started with her day 8 treatment this cycle, first dose today  Lab Results  Component Value Date   CA2729 52.6 (H) 12/10/2020   CA2729 73.9 (H) 11/19/2020   CA2729 104.2 (H) 10/29/2020   We will start following her TSH with the 12/31/2020 labs.  REVIEW OF SYSTEMS: Karrington felt a little bit more tired this past weekend after the zoledronate treatment than she had previously with the Taxol.  She had less appetite.  There were no other symptoms in particular she did not have significant bone aches or pain.  She has  developed mild rosacea.  Aside from this a detailed review of systems today was remarkably stable   COVID 19 VACCINATION STATUS: refuses vaccination, had COVID 29 September 2019   HISTORY OF CURRENT ILLNESS: From the original intake note:  BROOKLEN RUNQUIST had routine screening mammography on 04/27/2019 showing a possible abnormality in the right breast. She underwent right diagnostic mammography with tomography and right breast ultrasonography at [facility] on 05/01/2019 showing: breast density category C; highly suspicious 1.7 cm irregular mass in the right breast at 10 o'clock, 8 cm from the nipple; indeterminate hypoechoic round mass in the right axillary tail/low axilla. Physical exam showed no suspicious lumps.  Accordingly on 05/02/2019 she proceeded to biopsy of the right breast area in question. The pathology from this procedure ([id number]) showed: invasive ductal carcinoma, grade 3; ductal carcinoma in situ; lymphovascular invasion present. Prognostic indicators significant for: estrogen receptor, 30% positive with moderate staining intensity, and progesterone receptor, 30% positive with strong staining intensity. Proliferation marker Ki67 at 20%. HER2 negative by immunohistochemistry (1+).  The second "satellite" lesion was a lymph node which was also positive.  The patient's subsequent history is as detailed below.   PAST MEDICAL HISTORY: Past Medical History:  Diagnosis Date  . Cancer [facility]) 2020   breast Cancer, 2022 liver mets  . Chronic low back pain with left-sided sciatica   . Family history of leukemia   . Family history of stomach cancer   . Hypertension   . Personal history of chemotherapy   . Personal history of radiation therapy    finished june '  21    PAST SURGICAL HISTORY: Past Surgical History:  Procedure Laterality Date  . APPLICATION OF WOUND VAC N/A 10/16/2019   Procedure: APPLICATION OF WOUND VAC;  Surgeon: Gaye Pollack, MD;  Location: [facility] OR;   Service: Vascular;  Laterality: N/A;  . BREAST BIOPSY Right 05/02/2019   right clips X2  . BREAST LUMPECTOMY Right    06/2019  . BREAST LUMPECTOMY WITH RADIOACTIVE SEED AND SENTINEL LYMPH NODE BIOPSY Right 06/12/2019   Procedure: RIGHT BREAST RADIOACTIVE SEED LUMPECTOMY  AND RIGHT AXILLARY SEED TARGETED LYMPH NODE AND AXILLARY SENTINEL LYMPH NODE MAPPING;  Surgeon: Erroll Luna, MD;  Location: [place];  Service: General;  Laterality: Right;  PEC BLOCK  . C/S x2  '98 '03  . CENTRAL VENOUS CATHETER INSERTION  10/02/2019   Procedure: Insertion Central Line Adult;  Surgeon: Serafina Mitchell, MD;  Location: [place];  Service: Vascular;;  . CESAREAN SECTION    . CHOLECYSTECTOMY    . I & D EXTREMITY N/A 10/16/2019   Procedure: DEBRIDEMENT of DEHISCED MIDLINE SURGICAL INCISION;  Surgeon: Gaye Pollack, MD;  Location: [facility];  Service: Vascular;  Laterality: N/A;  . IR IMAGING GUIDED PORT INSERTION  11/24/2020  . LAPAROSCOPIC ASSISTED VAGINAL HYSTERECTOMY  05/17/2011   Procedure: LAPAROSCOPIC ASSISTED VAGINAL HYSTERECTOMY;  Surgeon: Sharene Butters;  Location: [place] [facility];  Service: Gynecology;  Laterality: N/A;  Laparoscopic Assisted Vaginal Hysterectomy With Lysis Of Adhesions  . PERICARDIAL WINDOW N/A 10/02/2019   Procedure: Pericardial Window;  Surgeon: Serafina Mitchell, MD;  Location: [place];  Service: Vascular;  Laterality: N/A;  . PORT-A-CATH REMOVAL  10/02/2019   Procedure: Removal Port-A-Cath;  Surgeon: Serafina Mitchell, MD;  Location: [place];  Service: Vascular;;  . PORTACATH PLACEMENT Right 06/12/2019   Procedure: INSERTION PORT-A-CATH WITH ULTRASOUND;  Surgeon: Erroll Luna, MD;  Location: [place];  Service: General;  Laterality: Right;  . RE-EXCISION OF BREAST LUMPECTOMY Right 07/17/2019   Procedure: RE-EXCISION OF RIGHT BREAST LUMPECTOMY;  Surgeon: Erroll Luna, MD;  Location: [place];  Service: General;  Laterality: Right;  . TUBAL LIGATION     . ULTRASOUND GUIDANCE FOR VASCULAR ACCESS  10/02/2019   Procedure: Ultrasound Guidance For Vascular Access;  Surgeon: Serafina Mitchell, MD;  Location: [facility] OR;  Service: Vascular;;  . VENOGRAM N/A 10/02/2019   Procedure: Central VENOGRAM;  Surgeon: Serafina Mitchell, MD;  Location: [facility] OR;  Service: Vascular;  Laterality: N/A;    FAMILY HISTORY: Family History  Problem Relation Age of Onset  . Arthritis Mother   . COPD Mother   . Hypertension Mother   . Hyperlipidemia Mother   . Leukemia Brother 41  . Stomach cancer Maternal Grandmother        late 56s  Patient's parents are living (as of September 2020). Her father is 9 and her mother is 24 as of 04/2019. The patient denies a family hx of breast or ovarian cancer. She has 7 siblings, 6 brothers and 1 sister. She reports her maternal grandmother was diagnosed with stomach cancer at age 66. Her brother was diagnosed with leukemia at age 49.   GYNECOLOGIC HISTORY:  No LMP recorded. Patient has had a hysterectomy. Menarche: 50 years old Age at first live birth: 50 years old GX P 2 (+1 stillborn) LMP age 83-42 Contraceptive: unsure, maybe for a year at age 53. HRT no Hysterectomy? Yes, 05/2011 BSO? 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 She is married. Husband Sheppard Plumber") is a Hydrologist for a telephone company. She lives at home with her husband and two daughters. Daughter Suszanne Conners, age 76, is a Scientist, water quality. Daughter Jarrett Soho, age 27, is a Ship broker.     ADVANCED DIRECTIVES: Husband Heath Lark is her HCPOA.   HEALTH MAINTENANCE: Social History   Tobacco Use  . Smoking status: Never Smoker  . Smokeless tobacco: Never Used  Vaping Use  . Vaping Use: Never used  Substance Use Topics  . Alcohol use: Not Currently    Alcohol/week: 2.0 standard drinks    Types: 2 Standard drinks or equivalent per week    Comment: "2-3 a week"  . Drug use: No     Colonoscopy: n/a  PAP:  02/2018  Bone density: n/a   No Known Allergies  Current Outpatient Medications  Medication Sig Dispense Refill  . metroNIDAZOLE (METROGEL) 1 % gel Apply topically daily. 45 g 0  . Cholecalciferol (VITAMIN D3) 25 MCG (1000 UT) CHEW Chew 1 tablet by mouth daily.     Marland Kitchen gabapentin (NEURONTIN) 300 MG capsule Take 1 capsule (300 mg total) by mouth at bedtime as needed. 90 capsule 4  . metoprolol tartrate (LOPRESSOR) 25 MG tablet Take 1 tablet (25 mg total) by mouth 2 (two) times daily. 60 tablet 0  . ondansetron (ZOFRAN) 8 MG tablet Take 1 tablet (8 mg total) by mouth 2 (two) times daily as needed (Nausea or vomiting). 30 tablet 1  . prochlorperazine (COMPAZINE) 10 MG tablet Take 1 tablet (10 mg total) by mouth every 6 (six) hours as needed (Nausea or vomiting). 90 tablet 1  . rivaroxaban (XARELTO) 20 MG TABS tablet Take 1 tablet (20 mg total) by mouth daily with supper. 90 tablet 3   No current facility-administered medications for this visit.    OBJECTIVE: white woman who appears stated age  70:   12/17/20 0809  BP: (!) 106/53  Pulse: 78  Resp: 17  Temp: 97.6 F (36.4 C)  SpO2: 100%   Wt Readings from Last 3 Encounters:  12/17/20 221 lb 8 oz (100.5 kg)  12/10/20 222 lb 12 oz (101 kg)  11/26/20 215 lb 6.4 oz (97.7 kg)   Body mass index is 41.85 kg/m.    ECOG FS:1 - Symptomatic but completely ambulatory  Sclerae unicteric, EOMs intact Wearing a mask No cervical or supraclavicular adenopathy Lungs no rales or rhonchi Heart regular rate and rhythm Abd soft, nontender, positive bowel sounds MSK no focal spinal tenderness, no upper extremity lymphedema Neuro: nonfocal, well oriented, appropriate affect Breasts: I do not palpate a mass in either breast.  There are no skin or nipple changes of concern.   LAB RESULTS:  CMP     Component Value Date/Time   NA 138 12/17/2020 0758   K 4.3 12/17/2020 0758   CL 109 12/17/2020 0758   CO2 23 12/17/2020 0758   GLUCOSE 126 (H)  12/17/2020 0758   BUN 15 12/17/2020 0758   CREATININE 0.75 12/17/2020 0758   CREATININE 0.75 04/24/2020 1035   CALCIUM 9.2 12/17/2020 0758   PROT 6.4 (L) 12/17/2020 0758   ALBUMIN 3.7 12/17/2020 0758   AST 22 12/17/2020 0758   AST 23 04/24/2020 1035   ALT 27 12/17/2020 0758   ALT 20 04/24/2020 1035   ALKPHOS 80 12/17/2020 0758   BILITOT 1.1 12/17/2020 0758   BILITOT 1.1 04/24/2020 1035   GFRNONAA >60 12/17/2020 0758   GFRNONAA >60 04/24/2020 1035  GFRAA >60 04/24/2020 1035    No results found for: TOTALPROTELP, ALBUMINELP, A1GS, A2GS, BETS, BETA2SER, GAMS, MSPIKE, SPEI  No results found for: KPAFRELGTCHN, LAMBDASER, [facility]  Lab Results  Component Value Date   WBC 4.5 12/17/2020   NEUTROABS 3.0 12/17/2020   HGB 11.5 (L) 12/17/2020   HCT 34.0 (L) 12/17/2020   MCV 87.4 12/17/2020   PLT 216 12/17/2020   No results found for: LABCA2  No components found for: YDXAJO878  No results for input(s): INR in the last 168 hours.  No results found for: LABCA2  No results found for: CAN199  No results found for: CAN125  No results found for: MVE720  Lab Results  Component Value Date   CA2729 52.6 (H) 12/10/2020    No components found for: HGQUANT  Lab Results  Component Value Date   CEA1 2.45 10/29/2020   /  CEA ([facility])  Date Value Ref Range Status  10/29/2020 2.45 0.00 - 5.00 ng/mL Final    Comment:    (NOTE) This test was performed using Architect's Chemiluminescent Microparticle Immunoassay. Values obtained from different assay methods cannot be used interchangeably. Please note that 5-10% of patients who smoke may see CEA levels up to 6.9 ng/mL. Performed at [facility], [place] [address], [place], [place] [address]      No results found for: AFPTUMOR  No results found for: [place]  No results found for: HGBA, HGBA2QUANT, HGBFQUANT, HGBSQUAN (Hemoglobinopathy evaluation)   Lab Results  Component Value Date    LDH 170 09/30/2019    No results found for: IRON, TIBC, IRONPCTSAT (Iron and TIBC)  Lab Results  Component Value Date   FERRITIN 498 (H) 10/19/2019    Urinalysis    Component Value Date/Time   COLORURINE YELLOW 08/28/2019 2326   APPEARANCEUR CLOUDY (A) 08/28/2019 2326   LABSPEC 1.013 08/28/2019 2326   PHURINE 5.0 08/28/2019 2326   GLUCOSEU NEGATIVE 08/28/2019 2326   HGBUR MODERATE (A) 08/28/2019 2326   BILIRUBINUR NEGATIVE 08/28/2019 2326   KETONESUR NEGATIVE 08/28/2019 2326   PROTEINUR NEGATIVE 08/28/2019 2326   NITRITE NEGATIVE 08/28/2019 2326   LEUKOCYTESUR MODERATE (A) 08/28/2019 2326    STUDIES: CT Head Wo Contrast  Result Date: 11/25/2020 CLINICAL DATA:  Evaluate for cerebral hemorrhage or Mets. Breast cancer. EXAM: CT HEAD WITHOUT CONTRAST TECHNIQUE: Contiguous axial images were obtained from the base of the skull through the vertex without intravenous contrast. COMPARISON:  CT head 07/16/2020. FINDINGS: Brain: No evidence of acute large vascular territory infarction, hemorrhage, hydrocephalus, extra-axial collection or mass lesion/mass effect. Vascular: No hyperdense vessel identified. Skull: No acute fracture. Sinuses/Orbits: Air-fluid level in left maxillary and left sphenoid sinuses. Unremarkable orbits. Other: No mastoid effusion. IMPRESSION: 1. No evidence of acute abnormality. An MRI with contrast could provide more sensitive evaluation for metastatic disease if clinically indicated. 2. Nonspecific left maxillary and left sphenoid sinus air-fluid levels. Correlate with signs/symptoms of sinusitis. Electronically Signed   By: Margaretha Sheffield MD   On: 11/25/2020 10:10   NM Bone Scan Whole Body  Result Date: 11/26/2020 CLINICAL DATA:  RIGHT breast cancer post lumpectomy and sentinel lymph node biopsy EXAM: NUCLEAR MEDICINE WHOLE BODY BONE SCAN TECHNIQUE: Whole body anterior and posterior images were obtained approximately 3 hours after intravenous injection of  radiopharmaceutical. RADIOPHARMACEUTICALS:  21.2 mCi Technetium-27mMDP IV COMPARISON:  None Radiographic correlation: CT angio chest 10/07/2020 FINDINGS: Foci of abnormal tracer uptake at anterior LEFT fourth rib and posterior LEFT eighth rib. Anterior fourth  rib lesion corresponds to subtle area of sclerosis on prior CT series 11, image 65. Posterior LEFT eighth rib lesion corresponds to a lytic lesion on CT series 11 image 86. Large area of increased tracer uptake at the intertrochanteric region proximal LEFT femur extending into neck, suspicious for metastasis recommend radiographic correlation. Uptake at shoulders, sternoclavicular joints, hips, typically degenerative. Uptake at RIGHT lateral aspect of lumbar spine at L3, indeterminate, could be degenerative or metastatic. Uptake at RIGHT mandible, typically dental in origin. No additional sites of abnormal tracer accumulation. Increased tracer uptake in the RIGHT breast question related to edema / post therapy changes. Otherwise expected urinary tract and soft tissue distribution of tracer. IMPRESSION: Suspected metastatic lesions involving the anterior LEFT fourth rib, posterior LEFT eighth rib, and proximal LEFT femur at the intertrochanteric region. Recommend LEFT hip radiographs to exclude lesion at risk for pathologic fracture. These results will be called to the ordering clinician or representative by the Radiologist Assistant, and communication documented in the PACS or [employer]. Electronically Signed   By: Lavonia Dana M.D.   On: 11/26/2020 08:45   DG Hip Unilat W or W/O Pelvis 1 View Left  Result Date: 11/28/2020 CLINICAL DATA:  Bone metastasis EXAM: DG HIP (WITH OR WITHOUT PELVIS) 1V*L* COMPARISON:  None. FINDINGS: No fracture or dislocation is seen. Bilateral hip joint spaces are preserved. Mild degenerative changes of the lower lumbar spine. 2.8 cm sclerotic lesion in the left femoral neck, corresponding to the patient's known osseous  metastasis. IMPRESSION: Osseous metastasis in the left femoral neck. No fracture or dislocation is seen. Electronically Signed   By: Julian Hy M.D.   On: 11/28/2020 12:21   IR IMAGING GUIDED PORT INSERTION  Result Date: 11/24/2020 INDICATION: 50 year old female with breast cancer metastatic to the liver. She presents for placement of a left-sided port catheter. 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 EXAM: IMPLANTED PORT A CATH PLACEMENT WITH ULTRASOUND AND FLUOROSCOPIC GUIDANCE MEDICATIONS: None. ANESTHESIA/SEDATION: Versed 2 mg IV; Fentanyl 100 mcg IV; Moderate Sedation Time:  21 minutes The patient was continuously monitored during the procedure by the interventional radiology nurse under my direct supervision. FLUOROSCOPY TIME:  0 minutes, 36 seconds (6 mGy) COMPLICATIONS: None immediate. PROCEDURE: The left neck and chest was prepped with chlorhexidine, and draped in the usual sterile fashion using maximum barrier technique (cap and mask, sterile gown, sterile gloves, large sterile sheet, hand hygiene and cutaneous antiseptic). Local anesthesia was attained by infiltration with 1% lidocaine with epinephrine. Ultrasound demonstrated patency of the left internal jugular vein vein, and this was documented with an image. Under real-time ultrasound guidance, this vein was accessed with a 21 gauge micropuncture needle and image documentation was performed. A small dermatotomy was made at the access site with an 11 scalpel. A 0.018" wire was advanced into the SVC and the access needle exchanged for a 75F micropuncture vascular sheath. The 0.018" wire was then removed and a 0.035" wire advanced into the IVC. An appropriate location for the subcutaneous reservoir was selected below the clavicle and an incision was made through the skin and underlying soft tissues. The subcutaneous tissues were then dissected using a combination of blunt and sharp surgical technique and a pocket was formed. A  single lumen power injectable portacatheter was then tunneled through the subcutaneous tissues from the pocket to the dermatotomy and the port reservoir placed within the subcutaneous pocket. The venous access site was then serially dilated and a peel away vascular sheath placed over  the wire. The wire was removed and the port catheter advanced into position under fluoroscopic guidance. The catheter tip is positioned in the superior cavoatrial junction. This was documented with a spot image. The portacatheter was then tested and found to flush and aspirate well. The port was flushed with saline followed by 100 units/mL heparinized saline. The pocket was then closed in two layers using first subdermal inverted interrupted absorbable sutures followed by a running subcuticular suture. The epidermis was then sealed with Dermabond. The dermatotomy at the venous access site was also closed with Dermabond. IMPRESSION: Successful placement of a left IJ approach Power Port with ultrasound and fluoroscopic guidance. The catheter is ready for use. Electronically Signed   By: Jacqulynn Cadet M.D.   On: 11/24/2020 16:16     ELIGIBLE FOR AVAILABLE RESEARCH PROTOCOL: AET  ASSESSMENT: 50 y.o. [place], [place] woman status post right breast upper outer quadrant biopsy 05/01/2019 for a clinical T1c N1, stage IIA invasive ductal carcinoma, grade 3, estrogen and progesterone receptor positive, HER-2 nonamplified, with an MIB-1-1 of 20%.  (a) mass in the axillary tail was a positive lymph node  (1) MammaPrint obtained from the original biopsy shows a high risk luminal B tumor and predicts a 5-year disease-free survival of 91% in her case  (2) genetics testing 05/09/2019 through the Common Hereditary Gene Panel offered by [facility] found no deleterious mutations in APC, ATM, AXIN2, BARD1, BMPR1A, BRCA1, BRCA2, BRIP1, CDH1, CDK4, CDKN2A (p14ARF), CDKN2A (p16INK4a), CHEK2, CTNNA1, DICER1, EPCAM (Deletion/duplication testing only),  GREM1 (promoter region deletion/duplication testing only), KIT, MEN1, MLH1, MSH2, MSH3, MSH6, MUTYH, NBN, NF1, NHTL1, PALB2, PDGFRA, PMS2, POLD1, POLE, PTEN, RAD50, RAD51C, RAD51D, RNF43, SDHB, SDHC, SDHD, SMAD4, SMARCA4. STK11, TP53, TSC1, TSC2, and VHL.  The following genes were evaluated for sequence changes only: SDHA and HOXB13 c.251G>A variant only.   (a) A variant of uncertain significance (VUS) was detected in one of her MSH6 genes (c.831A>C).  (3) status post right lumpectomy and sentinel lymph node sampling 06/12/2019 for a pT2 pN1, stage IIA invasive ductal carcinoma, grade 2, with positive margins  (a) a total of 4 sentinel lymph nodes removed, one positive (with ECE), ine itc  (b) margin clearance 04/19/2019 successful (medial margin close but negative for DCIS)  (4) adjuvant chemotherapy consisting of doxorubicin and cyclophosphamide in dose dense fashion x4 starting 07/10/2019, completed 09/24/2019; planned weekly paclitaxel x12 omitted   (a) echo 06/26/2019 shows an EF of 60-65%  (b echo on 09/19/2019 shows an EF of 60-65%  (c) chemotherapy stopped after four cycles of doxorubicin and cyclophosphamide due to #5 below  (5) Multiple VTE/PE documented 10/02/2019, on intravenous heparin initially, then Xarelto started 10/19/2019  (a) presented with SVC syndrome and bilateral pulmonary emboli on 09/28/2019  (b) s/p SVC thrombectomy and port removal 40/98/1191 complicated by hemopericardium and tamponade, necessitating pericardial window placement   (c) postop course complicated by wound dehiscence requiring wound VAC  (c) Doppler 10/03/2019 showed right lower extremity DVT  (d) CT angio and Doppler of both upper extremities 10/13/2019 found persistent acute bilateral pulmonary emboli, persistent but improved SVC thrombus, bilateral pleural effusions, and right lower lobe collapse. 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 This was delivered using a 3-D conformal technique. The patient then received a boost to the seroma. This delivered an additional 10 Gy in 5 fractions using a 3-field photon boost technique. The total dose was 60.4 Gy.  (8) anastrozole started mid May 2021  (a) [facility] and estradiol obtained 11/15/2019 consistent with menopause  (b) not a good candidate for tamoxifen given history of DVT/PE above  (c) bone density 07/31/2020 normal (T score equals 0.1).  METASTATIC DISEASE: Jan 2022 (9) CT angio of the chest 12/28/2021shows new liver lesions (not seen on CT angio January 2021)  (a) MRI 10/22/2020 shows multiple liver lesions, the largest 2.5 cm  (b)   liver biopsy 11/03/2020 confirms metastatic carcinoma estrogen receptor 10% positive with weak staining intensity, HER-2 and progesterone receptor negative.  (c) bone scan 11/25/2020 shows left femoral lesion as well as other lesions   (i) left hip films not suggestive of impending pathologic fracture  (d) non-contrast head CT 11/25/2020 negative except for sinusitis  (e) CA 27-29 is informative (was 104.1 on 10/29/2020)  (10) foundation 1 study requested on 11/03/2020 liver biopsy (also requested by [facility])  (11) started paclitaxel 11/19/2018 2012, repeated day 1 day 8 of every 21-day cycle  (a) pembrolizumab added 12/17/2020, repeated every 21 days  (12) zoledronate added 12/10/2020, repeated every 12 weeks   PLAN:  Agustina is tolerating the Taxol chemotherapy remarkably well and specifically has not  developed any peripheral neuropathy problems so far.  She did have some additional side effects from the zoledronate but thankfully no significant bone aches or pains.  I reviewed the tumor marker results with her today.  These are very favorable.  We then went over the pembrolizumab/Keytruda.  She has a good understanding of the possible toxicities side effects and complications of this agent.  We will start monitoring her TSH beginning 12/31/2020.  I have added MetroGel for her rosacea.  She will let me know if that is not effective for her.  Once she completes 4 cycles of the current treatment she will be restaged  Total encounter time 25 minutes.Sarajane Jews C. Shreya Lacasse, MD Medical Oncology and Hematology [place] [facility], [place] [address] Tel. [phone]    Fax. [phone]   I, Wilburn Mylar, am acting as scribe for Dr. Virgie Dad. Leibish Mcgregor.  I, Lurline Del MD, have reviewed the above documentation for accuracy and completeness, and I agree with the above.   *Total Encounter Time as defined by the Centers for Medicare and Medicaid Services includes, in addition to the face-to-face time of a patient visit (documented in the note above) non-face-to-face time: obtaining and reviewing outside history, ordering and reviewing medications, tests or procedures, care coordination (communications with other health care professionals or caregivers) and documentation in the medical record.

## 2020-12-17 ENCOUNTER — Inpatient Hospital Stay (HOSPITAL_BASED_OUTPATIENT_CLINIC_OR_DEPARTMENT_OTHER): Payer: 59 | Admitting: Oncology

## 2020-12-17 ENCOUNTER — Other Ambulatory Visit: Payer: Self-pay

## 2020-12-17 ENCOUNTER — Inpatient Hospital Stay: Payer: 59

## 2020-12-17 VITALS — BP 106/53 | HR 78 | Temp 97.6°F | Resp 17 | Ht 61.0 in | Wt 221.5 lb

## 2020-12-17 DIAGNOSIS — Z170 Estrogen receptor positive status [ER+]: Secondary | ICD-10-CM

## 2020-12-17 DIAGNOSIS — Z5112 Encounter for antineoplastic immunotherapy: Secondary | ICD-10-CM | POA: Diagnosis not present

## 2020-12-17 DIAGNOSIS — C50411 Malignant neoplasm of upper-outer quadrant of right female breast: Secondary | ICD-10-CM

## 2020-12-17 DIAGNOSIS — C787 Secondary malignant neoplasm of liver and intrahepatic bile duct: Secondary | ICD-10-CM

## 2020-12-17 LAB — COMPREHENSIVE METABOLIC PANEL
ALT: 27 U/L (ref 0–44)
AST: 22 U/L (ref 15–41)
Albumin: 3.7 g/dL (ref 3.5–5.0)
Alkaline Phosphatase: 80 U/L (ref 38–126)
Anion gap: 6 (ref 5–15)
BUN: 15 mg/dL (ref 6–20)
CO2: 23 mmol/L (ref 22–32)
Calcium: 9.2 mg/dL (ref 8.9–10.3)
Chloride: 109 mmol/L (ref 98–111)
Creatinine, Ser: 0.75 mg/dL (ref 0.44–1.00)
GFR, Estimated: >60 mL/min (ref >60)
Glucose, Bld: 126 mg/dL — ABNORMAL HIGH (ref 70–99)
Potassium: 4.3 mmol/L (ref 3.5–5.1)
Sodium: 138 mmol/L (ref 135–145)
Total Bilirubin: 1.1 mg/dL (ref 0.3–1.2)
Total Protein: 6.4 g/dL — ABNORMAL LOW (ref 6.5–8.1)

## 2020-12-17 LAB — CBC WITH DIFFERENTIAL/PLATELET
Abs Immature Granulocytes: 0.08 10*3/uL — ABNORMAL HIGH (ref 0.00–0.07)
Basophils Absolute: 0.1 10*3/uL (ref 0.0–0.1)
Basophils Relative: 1 %
Eosinophils Absolute: 0.1 10*3/uL (ref 0.0–0.5)
Eosinophils Relative: 2 %
HCT: 34 % — ABNORMAL LOW (ref 36.0–46.0)
Hemoglobin: 11.5 g/dL — ABNORMAL LOW (ref 12.0–15.0)
Immature Granulocytes: 2 %
Lymphocytes Relative: 24 %
Lymphs Abs: 1.1 10*3/uL (ref 0.7–4.0)
MCH: 29.6 pg (ref 26.0–34.0)
MCHC: 33.8 g/dL (ref 30.0–36.0)
MCV: 87.4 fL (ref 80.0–100.0)
Monocytes Absolute: 0.2 10*3/uL (ref 0.1–1.0)
Monocytes Relative: 4 %
Neutro Abs: 3 10*3/uL (ref 1.7–7.7)
Neutrophils Relative %: 67 %
Platelets: 216 10*3/uL (ref 150–400)
RBC: 3.89 MIL/uL (ref 3.87–5.11)
RDW: 13.1 % (ref 11.5–15.5)
WBC: 4.5 10*3/uL (ref 4.0–10.5)
nRBC: 0 % (ref 0.0–0.2)

## 2020-12-17 MED ORDER — SODIUM CHLORIDE 0.9 % IV SOLN
200.0000 mg | Freq: Once | INTRAVENOUS | Status: AC
  Administered 2020-12-17: 200 mg via INTRAVENOUS
  Filled 2020-12-17: qty 8

## 2020-12-17 MED ORDER — DIPHENHYDRAMINE HCL 50 MG/ML IJ SOLN
INTRAMUSCULAR | Status: AC
  Filled 2020-12-17: qty 1

## 2020-12-17 MED ORDER — DIPHENHYDRAMINE HCL 50 MG/ML IJ SOLN
25.0000 mg | Freq: Once | INTRAMUSCULAR | Status: AC
  Administered 2020-12-17: 25 mg via INTRAVENOUS

## 2020-12-17 MED ORDER — SODIUM CHLORIDE 0.9 % IV SOLN
90.0000 mg/m2 | Freq: Once | INTRAVENOUS | Status: AC
  Administered 2020-12-17: 186 mg via INTRAVENOUS
  Filled 2020-12-17: qty 31

## 2020-12-17 MED ORDER — FAMOTIDINE IN NACL 20-0.9 MG/50ML-% IV SOLN
20.0000 mg | Freq: Once | INTRAVENOUS | Status: AC
  Administered 2020-12-17: 20 mg via INTRAVENOUS

## 2020-12-17 MED ORDER — FAMOTIDINE IN NACL 20-0.9 MG/50ML-% IV SOLN
INTRAVENOUS | Status: AC
  Filled 2020-12-17: qty 50

## 2020-12-17 MED ORDER — DEXAMETHASONE SODIUM PHOSPHATE 10 MG/ML IJ SOLN
INTRAMUSCULAR | Status: AC
  Filled 2020-12-17: qty 1

## 2020-12-17 MED ORDER — SODIUM CHLORIDE 0.9% FLUSH
10.0000 mL | INTRAVENOUS | Status: DC | PRN
  Administered 2020-12-17: 10 mL
  Filled 2020-12-17: qty 10

## 2020-12-17 MED ORDER — SODIUM CHLORIDE 0.9 % IV SOLN
Freq: Once | INTRAVENOUS | Status: AC
  Filled 2020-12-17: qty 250

## 2020-12-17 MED ORDER — HEPARIN SOD (PORK) LOCK FLUSH 100 UNIT/ML IV SOLN
500.0000 [IU] | Freq: Once | INTRAVENOUS | Status: AC | PRN
  Administered 2020-12-17: 500 [IU]
  Filled 2020-12-17: qty 5

## 2020-12-17 MED ORDER — DEXAMETHASONE SODIUM PHOSPHATE 10 MG/ML IJ SOLN
4.0000 mg | Freq: Once | INTRAMUSCULAR | Status: AC
Start: 2020-12-17 — End: 2020-12-17
  Administered 2020-12-17: 4 mg via INTRAVENOUS

## 2020-12-17 MED ORDER — METRONIDAZOLE 1 % EX GEL: Freq: Every day | CUTANEOUS | 0 refills | Status: DC

## 2020-12-17 NOTE — Patient Instructions (Addendum)
[facility] Discharge Instructions for Patients Receiving Chemotherapy  Today you received the following chemotherapy agents Taxol; Keytruda  To help prevent nausea and vomiting after your treatment, we encourage you to take your nausea medication as directed.    If you develop nausea and vomiting that is not controlled by your nausea medication, call the clinic.   BELOW ARE SYMPTOMS THAT SHOULD BE REPORTED IMMEDIATELY:  *FEVER GREATER THAN 100.5 F  *CHILLS WITH OR WITHOUT FEVER  NAUSEA AND VOMITING THAT IS NOT CONTROLLED WITH YOUR NAUSEA MEDICATION  *UNUSUAL SHORTNESS OF BREATH  *UNUSUAL BRUISING OR BLEEDING  TENDERNESS IN MOUTH AND THROAT WITH OR WITHOUT PRESENCE OF ULCERS  *URINARY PROBLEMS  *BOWEL PROBLEMS  UNUSUAL RASH Items with * indicate a potential emergency and should be followed up as soon as possible.  Feel free to call the clinic should you have any questions or concerns. The clinic phone number is [phone].  Please show the [place] at check-in to the Emergency Department and triage nurse.

## 2020-12-18 ENCOUNTER — Telehealth: Payer: Self-pay | Admitting: *Deleted

## 2020-12-18 ENCOUNTER — Other Ambulatory Visit: Payer: Self-pay | Admitting: Oncology

## 2020-12-18 NOTE — Telephone Encounter (Signed)
Metronidazol Gel 1% PA- [id number] submitted has been denied on December 17, 2020.

## 2020-12-19 ENCOUNTER — Other Ambulatory Visit: Payer: Self-pay

## 2020-12-19 MED ORDER — DOXYCYCLINE HYCLATE 100 MG PO TABS: 100.0000 mg | ORAL_TABLET | Freq: Every day | ORAL | 4 refills | Status: DC

## 2020-12-24 ENCOUNTER — Telehealth: Payer: Self-pay | Admitting: Oncology

## 2020-12-24 ENCOUNTER — Telehealth: Payer: Self-pay

## 2020-12-24 NOTE — Telephone Encounter (Signed)
Contacted pt regarding message she left about her appointment. Pt states scheduling has already contacted her and she is fine to go to the [facility] location for treatment.

## 2020-12-24 NOTE — Telephone Encounter (Signed)
Scheduled appointments per 3/9 los. Spoke to patient who is aware of appointments dates and time. Patient is aware next two infusions will be in [place] due to limited availability.

## 2020-12-30 NOTE — Progress Notes (Signed)
[facility]  [phone] [phone]     ID: Veronica [name] DOB: Feb 03, 1971  MR#: [id number]  ESP#:[id number]  Patient Care Team: Jamie Kato as PCP - General (Family Medicine) Mauro Kaufmann, RN as Oncology Nurse Navigator [name], RN as Oncology Nurse Navigator Erroll Luna, MD as Consulting Physician (General Surgery) Josemanuel Eakins, Virgie Dad, MD as Consulting Physician (Oncology) Kyung Rudd, MD as Consulting Physician (Radiation Oncology) Gaye Pollack, MD as Consulting Physician (Cardiothoracic Surgery) Serafina Mitchell, MD as Consulting Physician (Vascular Surgery) Benedict Needy, MD as Referring Physician (Hematology and Oncology) Chauncey Cruel, MD OTHER MD:  CHIEF COMPLAINT: Estrogen receptor positive breast cancer  CURRENT TREATMENT: Paclitaxel day 1 and day 8 of each 21-day cycle, pembrolizumab given with day 8 of each cycle; zoledronate every 12 weeks   INTERVAL HISTORY: Veronica [name] returns today for follow-up and treatment of her now metastatic breast cancer.   She started paclitaxel, given on days 1 and 8, every 21 days, on 11/20/2020.  She started pembrolizumab on 12/17/2020, and it will be continued every 3 weeks on the day 8 of each cycle.  We are following her CA 27-29 which showing a very encouraging trend: Lab Results  Component Value Date   CA2729 52.6 (H) 12/10/2020   CA2729 73.9 (H) 11/19/2020   CA2729 104.2 (H) 10/29/2020   We will start following her TSH with the 12/31/2020 labs.   REVIEW OF SYSTEMS: Veronica [name] has had no peripheral neuropathy.  She is walking about 45 minutes most days.  She says that she has gained a little weight and wonders if some of it may be water weight.  After treatment she feels a little tired and has a little arts altered taste.  She has had no cough no rash no diarrhea and no nausea or vomiting no mouth sores and no problems with her port.  She is getting treated in [facility] this cycle because of lack of availability here.   COVID 19 VACCINATION STATUS: refuses vaccination, had COVID 29 September 2019   HISTORY OF CURRENT ILLNESS: From the original intake note:  Veronica [name] had routine screening mammography on 04/27/2019 showing a possible abnormality in the right breast. She underwent right diagnostic mammography with tomography and right breast ultrasonography at [facility] on 05/01/2019 showing: breast density category C; highly suspicious 1.7 cm irregular mass in the right breast at 10 o'clock, 8 cm from the nipple; indeterminate hypoechoic round mass in the right axillary tail/low axilla. Physical exam showed no suspicious lumps.  Accordingly on 05/02/2019 she proceeded to biopsy of the right breast area in question. The pathology from this procedure ([id number]) showed: invasive ductal carcinoma, grade 3; ductal carcinoma in situ; lymphovascular invasion present. Prognostic indicators significant for: estrogen receptor, 30% positive with moderate staining intensity, and progesterone receptor, 30% positive with strong staining intensity. Proliferation marker Ki67 at 20%. HER2 negative by immunohistochemistry (1+).  The second "satellite" lesion was a lymph node which was also positive.  The patient's subsequent history is as detailed below.   PAST MEDICAL HISTORY: Past Medical History:  Diagnosis Date  . Cancer [facility]) 2020   breast Cancer, 2022 liver mets  . Chronic low back pain with left-sided sciatica   . Family history of leukemia   . Family history of stomach cancer   . Hypertension   . Personal history of chemotherapy   . Personal history of radiation therapy    finished june '  21    PAST SURGICAL HISTORY: Past Surgical History:  Procedure Laterality Date  . APPLICATION OF WOUND VAC N/A 10/16/2019   Procedure: APPLICATION OF WOUND VAC;  Surgeon: Gaye Pollack, MD;  Location: [facility] OR;  Service: Vascular;  Laterality: N/A;  . BREAST  BIOPSY Right 05/02/2019   right clips X2  . BREAST LUMPECTOMY Right    06/2019  . BREAST LUMPECTOMY WITH RADIOACTIVE SEED AND SENTINEL LYMPH NODE BIOPSY Right 06/12/2019   Procedure: RIGHT BREAST RADIOACTIVE SEED LUMPECTOMY  AND RIGHT AXILLARY SEED TARGETED LYMPH NODE AND AXILLARY SENTINEL LYMPH NODE MAPPING;  Surgeon: Erroll Luna, MD;  Location: [facility];  Service: General;  Laterality: Right;  PEC BLOCK  . C/S x2  '98 '03  . CENTRAL VENOUS CATHETER INSERTION  10/02/2019   Procedure: Insertion Central Line Adult;  Surgeon: Serafina Mitchell, MD;  Location: [facility];  Service: Vascular;;  . CESAREAN SECTION    . CHOLECYSTECTOMY    . I & D EXTREMITY N/A 10/16/2019   Procedure: DEBRIDEMENT of DEHISCED MIDLINE SURGICAL INCISION;  Surgeon: Gaye Pollack, MD;  Location: [facility] OR;  Service: Vascular;  Laterality: N/A;  . IR IMAGING GUIDED PORT INSERTION  11/24/2020  . LAPAROSCOPIC ASSISTED VAGINAL HYSTERECTOMY  05/17/2011   Procedure: LAPAROSCOPIC ASSISTED VAGINAL HYSTERECTOMY;  Surgeon: Sharene Butters;  Location: [facility];  Service: Gynecology;  Laterality: N/A;  Laparoscopic Assisted Vaginal Hysterectomy With Lysis Of Adhesions  . PERICARDIAL WINDOW N/A 10/02/2019   Procedure: Pericardial Window;  Surgeon: Serafina Mitchell, MD;  Location: [facility];  Service: Vascular;  Laterality: N/A;  . PORT-A-CATH REMOVAL  10/02/2019   Procedure: Removal Port-A-Cath;  Surgeon: Serafina Mitchell, MD;  Location: [facility];  Service: Vascular;;  . PORTACATH PLACEMENT Right 06/12/2019   Procedure: INSERTION PORT-A-CATH WITH ULTRASOUND;  Surgeon: Erroll Luna, MD;  Location: [facility];  Service: General;  Laterality: Right;  . RE-EXCISION OF BREAST LUMPECTOMY Right 07/17/2019   Procedure: RE-EXCISION OF RIGHT BREAST LUMPECTOMY;  Surgeon: Erroll Luna, MD;  Location: [facility];  Service: General;  Laterality: Right;  . TUBAL LIGATION    . ULTRASOUND GUIDANCE FOR VASCULAR ACCESS   10/02/2019   Procedure: Ultrasound Guidance For Vascular Access;  Surgeon: Serafina Mitchell, MD;  Location: [facility] OR;  Service: Vascular;;  . VENOGRAM N/A 10/02/2019   Procedure: Central VENOGRAM;  Surgeon: Serafina Mitchell, MD;  [facility];  Service: Vascular;  Laterality: N/A;    FAMILY HISTORY: Family History  Problem Relation Age of Onset  . Arthritis Mother   . COPD Mother   . Hypertension Mother   . Hyperlipidemia Mother   . Leukemia Brother 104  . Stomach cancer Maternal Grandmother        late 65s  Patient's parents are living (as of September 2020). Her father is 49 and her mother is 27 as of 04/2019. The patient denies a family hx of breast or ovarian cancer. She has 7 siblings, 6 brothers and 1 sister. She reports her maternal grandmother was diagnosed with stomach cancer at age 34. Her brother was diagnosed with leukemia at age 60.   GYNECOLOGIC HISTORY:  No LMP recorded. Patient has had a hysterectomy. Menarche: 50 years old Age at first live birth: 50 years old GX P 2 (+1 stillborn) LMP age 25-42 Contraceptive: unsure, maybe for a year at age 42. HRT no Hysterectomy? Yes, 05/2011 BSO? 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 She is married. Husband Sheppard Plumber") is a Hydrologist for a telephone company. She lives at home with her husband and two daughters. Daughter Suszanne Conners, age 65, is a Scientist, water quality. Daughter Jarrett Soho, age 63, is a Ship broker.     ADVANCED DIRECTIVES: Husband Heath Lark is her HCPOA.   HEALTH MAINTENANCE: Social History   Tobacco Use  . Smoking status: Never Smoker  . Smokeless tobacco: Never Used  Vaping Use  . Vaping Use: Never used  Substance Use Topics  . Alcohol use: Not Currently    Alcohol/week: 2.0 standard drinks    Types: 2 Standard drinks or equivalent per week    Comment: "2-3 a week"  . Drug use: No     Colonoscopy: n/a  PAP: 02/2018  Bone density: n/a   No Known  Allergies  Current Outpatient Medications  Medication Sig Dispense Refill  . Cholecalciferol (VITAMIN D3) 25 MCG (1000 UT) CHEW Chew 1 tablet by mouth daily.     Marland Kitchen doxycycline (VIBRA-TABS) 100 MG tablet Take 1 tablet (100 mg total) by mouth daily. 30 tablet 4  . gabapentin (NEURONTIN) 300 MG capsule Take 1 capsule (300 mg total) by mouth at bedtime as needed. 90 capsule 4  . metoprolol tartrate (LOPRESSOR) 25 MG tablet Take 1 tablet (25 mg total) by mouth 2 (two) times daily. 60 tablet 0  . metroNIDAZOLE (METROGEL) 1 % gel Apply topically daily. 45 g 0  . ondansetron (ZOFRAN) 8 MG tablet Take 1 tablet (8 mg total) by mouth 2 (two) times daily as needed (Nausea or vomiting). 30 tablet 1  . prochlorperazine (COMPAZINE) 10 MG tablet Take 1 tablet (10 mg total) by mouth every 6 (six) hours as needed (Nausea or vomiting). 90 tablet 1  . rivaroxaban (XARELTO) 20 MG TABS tablet Take 1 tablet (20 mg total) by mouth daily with supper. 90 tablet 3   No current facility-administered medications for this visit.    OBJECTIVE: white woman in no acute distress  Vitals:   12/31/20 0905  BP: 128/68  Pulse: 85  Resp: 18  Temp: (!) 97.4 F (36.3 C)  SpO2: 100%   Wt Readings from Last 3 Encounters:  12/31/20 224 lb 9.6 oz (101.9 kg)  12/17/20 221 lb 8 oz (100.5 kg)  12/10/20 222 lb 12 oz (101 kg)   Body mass index is 42.44 kg/m.    ECOG FS:1 - Symptomatic but completely ambulatory  Sclerae unicteric, EOMs intact Wearing a mask No cervical or supraclavicular adenopathy Lungs no rales or rhonchi Heart regular rate and rhythm Abd soft, nontender, positive bowel sounds MSK no focal spinal tenderness, no upper extremity lymphedema Neuro: nonfocal, well oriented, appropriate affect Breasts: Deferred   LAB RESULTS:  CMP     Component Value Date/Time   NA 138 12/17/2020 0758   K 4.3 12/17/2020 0758   CL 109 12/17/2020 0758   CO2 23 12/17/2020 0758   GLUCOSE 126 (H) 12/17/2020 0758   BUN  15 12/17/2020 0758   CREATININE 0.75 12/17/2020 0758   CREATININE 0.75 04/24/2020 1035   CALCIUM 9.2 12/17/2020 0758   PROT 6.4 (L) 12/17/2020 0758   ALBUMIN 3.7 12/17/2020 0758   AST 22 12/17/2020 0758   AST 23 04/24/2020 1035   ALT 27 12/17/2020 0758   ALT 20 04/24/2020 1035   ALKPHOS 80 12/17/2020 0758   BILITOT 1.1 12/17/2020 0758   BILITOT 1.1 04/24/2020 1035   GFRNONAA >60 12/17/2020 0758   GFRNONAA >60 04/24/2020 1035  GFRAA >60 04/24/2020 1035    No results found for: TOTALPROTELP, ALBUMINELP, A1GS, A2GS, BETS, BETA2SER, GAMS, MSPIKE, SPEI  No results found for: KPAFRELGTCHN, LAMBDASER, [facility]  Lab Results  Component Value Date   WBC 3.2 (L) 12/31/2020   NEUTROABS 1.6 (L) 12/31/2020   HGB 11.2 (L) 12/31/2020   HCT 33.4 (L) 12/31/2020   MCV 87.9 12/31/2020   PLT 202 12/31/2020   No results found for: LABCA2  No components found for: [id number]  No results for input(s): INR in the last 168 hours.  No results found for: LABCA2  No results found for: CAN199  No results found for: CAN125  No results found for: WEX937  Lab Results  Component Value Date   CA2729 52.6 (H) 12/10/2020    No components found for: HGQUANT  Lab Results  Component Value Date   CEA1 2.45 10/29/2020   /  CEA ([facility])  Date Value Ref Range Status  10/29/2020 2.45 0.00 - 5.00 ng/mL Final    Comment:    (NOTE) This test was performed using Architect's Chemiluminescent Microparticle Immunoassay. Values obtained from different assay methods cannot be used interchangeably. Please note that 5-10% of patients who smoke may see CEA levels up to 6.9 ng/mL. Performed at [facility], [place] [address] [place], [place] [address]      No results found for: AFPTUMOR  No results found for: [place]  No results found for: HGBA, HGBA2QUANT, HGBFQUANT, HGBSQUAN (Hemoglobinopathy evaluation)   Lab Results  Component Value Date   LDH 170  09/30/2019    No results found for: IRON, TIBC, IRONPCTSAT (Iron and TIBC)  Lab Results  Component Value Date   FERRITIN 498 (H) 10/19/2019    Urinalysis    Component Value Date/Time   COLORURINE YELLOW 08/28/2019 2326   APPEARANCEUR CLOUDY (A) 08/28/2019 2326   LABSPEC 1.013 08/28/2019 2326   PHURINE 5.0 08/28/2019 2326   GLUCOSEU NEGATIVE 08/28/2019 2326   HGBUR MODERATE (A) 08/28/2019 2326   BILIRUBINUR NEGATIVE 08/28/2019 2326   [place] 08/28/2019 2326   PROTEINUR NEGATIVE 08/28/2019 2326   NITRITE NEGATIVE 08/28/2019 2326   LEUKOCYTESUR MODERATE (A) 08/28/2019 2326    STUDIES: No results found.   ELIGIBLE FOR AVAILABLE RESEARCH PROTOCOL: AET  ASSESSMENT: 50 y.o. [place], [place] woman status post right breast upper outer quadrant biopsy 05/01/2019 for a clinical T1c N1, stage IIA invasive ductal carcinoma, grade 3, estrogen and progesterone receptor positive, HER-2 nonamplified, with an MIB-1-1 of 20%.  (a) mass in the axillary tail was a positive lymph node  (1) MammaPrint obtained from the original biopsy shows a high risk luminal B tumor and predicts a 5-year disease-free survival of 91% in her case  (2) genetics testing 05/09/2019 through the Common Hereditary Gene Panel offered by [facility] found no deleterious mutations in APC, ATM, AXIN2, BARD1, BMPR1A, BRCA1, BRCA2, BRIP1, CDH1, CDK4, CDKN2A (p14ARF), CDKN2A (p16INK4a), CHEK2, CTNNA1, DICER1, EPCAM (Deletion/duplication testing only), GREM1 (promoter region deletion/duplication testing only), KIT, MEN1, MLH1, MSH2, MSH3, MSH6, MUTYH, NBN, NF1, NHTL1, PALB2, PDGFRA, PMS2, POLD1, POLE, PTEN, RAD50, RAD51C, RAD51D, RNF43, SDHB, SDHC, SDHD, SMAD4, SMARCA4. STK11, TP53, TSC1, TSC2, and VHL.  The following genes were evaluated for sequence changes only: SDHA and HOXB13 c.251G>A variant only.   (a) A variant of uncertain significance (VUS) was detected in one of her MSH6 genes (c.831A>C).  (3) status post right  lumpectomy and sentinel lymph node sampling 06/12/2019 for a pT2 pN1, stage IIA invasive ductal carcinoma,  grade 2, with positive margins  (a) a total of 4 sentinel lymph nodes removed, one positive (with ECE), ine itc  (b) margin clearance 04/19/2019 successful (medial margin close but negative for DCIS)  (4) adjuvant chemotherapy consisting of doxorubicin and cyclophosphamide in dose dense fashion x4 starting 07/10/2019, completed 09/24/2019; planned weekly paclitaxel x12 omitted   (a) echo 06/26/2019 shows an EF of 60-65%  (b echo on 09/19/2019 shows an EF of 60-65%  (c) chemotherapy stopped after four cycles of doxorubicin and cyclophosphamide due to #5 below  (5) Multiple VTE/PE documented 10/02/2019, on intravenous heparin initially, then Xarelto started 10/19/2019  (a) presented with SVC syndrome and bilateral pulmonary emboli on 09/28/2019  (b) s/p SVC thrombectomy and port removal 37/16/9678 complicated by hemopericardium and tamponade, necessitating pericardial window placement   (c) postop course complicated by wound dehiscence requiring wound VAC  (c) Doppler 10/03/2019 showed right lower extremity DVT  (d) CT angio and Doppler of both upper extremities 10/13/2019 found persistent acute bilateral pulmonary emboli, persistent but improved SVC thrombus, bilateral pleural effusions, and right lower lobe collapse. Bilateral upper extremity DVTs also noted  (e) Dopplers upper extremity 11/19/2019 showed clots cleared on the left, chronic DVT right internal jugular and axillary veins  (f) CT angio of the chest 10/07/2020 shows resolution of earlier pleural effusions and no evidence of active embolism (but see #9 below)  (6) COVID-19 infection documented 09/27/2020, status post remdesivir  (7) adjuvant radiation 12/24/19 - 02/06/20 Site/dose:   The patient initially received a dose of 50.4 Gy in 28 fractions to the breast and SCLV region using a 4-field approach. This was delivered using a  3-D conformal technique. The patient then received a boost to the seroma. This delivered an additional 10 Gy in 5 fractions using a 3-field photon boost technique. The total dose was 60.4 Gy.  (8) anastrozole started mid May 2021  (a) [facility] and estradiol obtained 11/15/2019 consistent with menopause  (b) not a good candidate for tamoxifen given history of DVT/PE above  (c) bone density 07/31/2020 normal (T score equals 0.1).  METASTATIC DISEASE: Jan 2022 (9) CT angio of the chest 12/28/2021shows new liver lesions (not seen on CT angio January 2021)  (a) MRI 10/22/2020 shows multiple liver lesions, the largest 2.5 cm  (b)   liver biopsy 11/03/2020 confirms metastatic carcinoma estrogen receptor 10% positive with weak staining intensity, HER-2 and progesterone receptor negative.  (c) bone scan 11/25/2020 shows left femoral lesion as well as other lesions   (i) left hip films not suggestive of impending pathologic fracture  (d) non-contrast head CT 11/25/2020 negative except for sinusitis  (e) CA 27-29 is informative (was 104.1 on 10/29/2020)  (10) foundation 1 study requested on 11/03/2020 liver biopsy (also requested by [facility])  (11) started paclitaxel 11/19/2018 2012, repeated day 1 day 8 of every 21-day cycle  (a) pembrolizumab added 12/17/2020, repeated every 21 days  (12) zoledronate added 12/10/2020, repeated every 12 weeks   PLAN:  Veronica [name] did very well with the first 2 cycles of chemotherapy.  She is not having significant problems from the Taxol and specifically no neuropathy.  She also tolerated her first pembrolizumab dose without any side effects that she is aware of.  We will obtain TSH today and follow that every 3weeks as hypothyroidism is a common complication of pembrolizumab.  She will see me again at the beginning of cycle 4, 01/22/2020, and then before we start cycle 5, she will have a restaging MRI of the  liver.  We are scheduling that for 02/18/2021.  I think she will get  good news at that time.  Note that her daughter will be finishing school in June and they plan a little vacation after that.  We will be glad adjusted treatment times accordingly.  Total encounter time 25 minutes.Veronica Jews C. Ivyonna Hoelzel, MD Medical Oncology and Hematology [facility] [place], [place] [address] Tel. [phone]    Fax. [phone]   I, Wilburn Mylar, am acting as scribe for Dr. Virgie Dad. Veronica [name].  I, Lurline Del MD, have reviewed the above documentation for accuracy and completeness, and I agree with the above.   *Total Encounter Time as defined by the Centers for Medicare and Medicaid Services includes, in addition to the face-to-face time of a patient visit (documented in the note above) non-face-to-face time: obtaining and reviewing outside history, ordering and reviewing medications, tests or procedures, care coordination (communications with other health care professionals or caregivers) and documentation in the medical record.

## 2020-12-31 ENCOUNTER — Inpatient Hospital Stay (HOSPITAL_BASED_OUTPATIENT_CLINIC_OR_DEPARTMENT_OTHER): Payer: 59 | Admitting: Oncology

## 2020-12-31 ENCOUNTER — Inpatient Hospital Stay: Payer: 59

## 2020-12-31 ENCOUNTER — Other Ambulatory Visit: Payer: Self-pay

## 2020-12-31 VITALS — BP 128/68 | HR 85 | Temp 97.4°F | Resp 18 | Ht 61.0 in | Wt 224.6 lb

## 2020-12-31 VITALS — BP 133/83 | HR 89 | Temp 98.5°F | Resp 18

## 2020-12-31 DIAGNOSIS — C50411 Malignant neoplasm of upper-outer quadrant of right female breast: Secondary | ICD-10-CM

## 2020-12-31 DIAGNOSIS — Z5112 Encounter for antineoplastic immunotherapy: Secondary | ICD-10-CM | POA: Diagnosis not present

## 2020-12-31 DIAGNOSIS — Z7189 Other specified counseling: Secondary | ICD-10-CM

## 2020-12-31 DIAGNOSIS — Z95828 Presence of other vascular implants and grafts: Secondary | ICD-10-CM

## 2020-12-31 DIAGNOSIS — Z170 Estrogen receptor positive status [ER+]: Secondary | ICD-10-CM

## 2020-12-31 DIAGNOSIS — C787 Secondary malignant neoplasm of liver and intrahepatic bile duct: Secondary | ICD-10-CM

## 2020-12-31 LAB — COMPREHENSIVE METABOLIC PANEL
ALT: 27 U/L (ref 0–44)
AST: 21 U/L (ref 15–41)
Albumin: 3.6 g/dL (ref 3.5–5.0)
Alkaline Phosphatase: 81 U/L (ref 38–126)
Anion gap: 9 (ref 5–15)
BUN: 14 mg/dL (ref 6–20)
CO2: 21 mmol/L — ABNORMAL LOW (ref 22–32)
Calcium: 8.9 mg/dL (ref 8.9–10.3)
Chloride: 110 mmol/L (ref 98–111)
Creatinine, Ser: 0.75 mg/dL (ref 0.44–1.00)
GFR, Estimated: >60 mL/min (ref >60)
Glucose, Bld: 136 mg/dL — ABNORMAL HIGH (ref 70–99)
Potassium: 3.9 mmol/L (ref 3.5–5.1)
Sodium: 140 mmol/L (ref 135–145)
Total Bilirubin: 1 mg/dL (ref 0.3–1.2)
Total Protein: 6.2 g/dL — ABNORMAL LOW (ref 6.5–8.1)

## 2020-12-31 LAB — CBC WITH DIFFERENTIAL/PLATELET
Abs Immature Granulocytes: 0.05 10*3/uL (ref 0.00–0.07)
Basophils Absolute: 0 10*3/uL (ref 0.0–0.1)
Basophils Relative: 1 %
Eosinophils Absolute: 0.1 10*3/uL (ref 0.0–0.5)
Eosinophils Relative: 2 %
HCT: 33.4 % — ABNORMAL LOW (ref 36.0–46.0)
Hemoglobin: 11.2 g/dL — ABNORMAL LOW (ref 12.0–15.0)
Immature Granulocytes: 2 %
Lymphocytes Relative: 27 %
Lymphs Abs: 0.9 10*3/uL (ref 0.7–4.0)
MCH: 29.5 pg (ref 26.0–34.0)
MCHC: 33.5 g/dL (ref 30.0–36.0)
MCV: 87.9 fL (ref 80.0–100.0)
Monocytes Absolute: 0.6 10*3/uL (ref 0.1–1.0)
Monocytes Relative: 18 %
Neutro Abs: 1.6 10*3/uL — ABNORMAL LOW (ref 1.7–7.7)
Neutrophils Relative %: 50 %
Platelets: 202 10*3/uL (ref 150–400)
RBC: 3.8 MIL/uL — ABNORMAL LOW (ref 3.87–5.11)
RDW: 15.9 % — ABNORMAL HIGH (ref 11.5–15.5)
WBC: 3.2 10*3/uL — ABNORMAL LOW (ref 4.0–10.5)
nRBC: 0 % (ref 0.0–0.2)

## 2020-12-31 LAB — TSH: TSH: 1.932 u[IU]/mL (ref 0.308–3.960)

## 2020-12-31 MED ORDER — SODIUM CHLORIDE 0.9 % IV SOLN
90.0000 mg/m2 | Freq: Once | INTRAVENOUS | Status: AC
  Administered 2020-12-31: 186 mg via INTRAVENOUS
  Filled 2020-12-31: qty 31

## 2020-12-31 MED ORDER — DIPHENHYDRAMINE HCL 50 MG/ML IJ SOLN
INTRAMUSCULAR | Status: AC
  Filled 2020-12-31: qty 1

## 2020-12-31 MED ORDER — FAMOTIDINE IN NACL 20-0.9 MG/50ML-% IV SOLN
INTRAVENOUS | Status: AC
  Filled 2020-12-31: qty 50

## 2020-12-31 MED ORDER — SODIUM CHLORIDE 0.9% FLUSH
10.0000 mL | INTRAVENOUS | Status: DC | PRN
  Administered 2020-12-31: 10 mL
  Filled 2020-12-31: qty 10

## 2020-12-31 MED ORDER — FAMOTIDINE IN NACL 20-0.9 MG/50ML-% IV SOLN
20.0000 mg | Freq: Once | INTRAVENOUS | Status: AC
  Administered 2020-12-31: 20 mg via INTRAVENOUS

## 2020-12-31 MED ORDER — DEXAMETHASONE SODIUM PHOSPHATE 10 MG/ML IJ SOLN
INTRAMUSCULAR | Status: AC
  Filled 2020-12-31: qty 1

## 2020-12-31 MED ORDER — SODIUM CHLORIDE 0.9 % IV SOLN
Freq: Once | INTRAVENOUS | Status: AC
  Filled 2020-12-31: qty 250

## 2020-12-31 MED ORDER — DIPHENHYDRAMINE HCL 50 MG/ML IJ SOLN
25.0000 mg | Freq: Once | INTRAMUSCULAR | Status: AC
  Administered 2020-12-31: 25 mg via INTRAVENOUS

## 2020-12-31 MED ORDER — DEXAMETHASONE SODIUM PHOSPHATE 10 MG/ML IJ SOLN
4.0000 mg | Freq: Once | INTRAMUSCULAR | Status: AC
  Administered 2020-12-31: 4 mg via INTRAVENOUS

## 2020-12-31 MED ORDER — HEPARIN SOD (PORK) LOCK FLUSH 100 UNIT/ML IV SOLN
500.0000 [IU] | Freq: Once | INTRAVENOUS | Status: AC | PRN
  Administered 2020-12-31: 500 [IU]
  Filled 2020-12-31: qty 5

## 2020-12-31 MED ORDER — SODIUM CHLORIDE 0.9% FLUSH
10.0000 mL | INTRAVENOUS | Status: DC | PRN
  Administered 2020-12-31: 10 mL via INTRAVENOUS
  Filled 2020-12-31: qty 10

## 2020-12-31 NOTE — Patient Instructions (Signed)

## 2020-12-31 NOTE — Progress Notes (Signed)
Patient stayed accessed for treatment in [place].

## 2020-12-31 NOTE — Patient Instructions (Signed)
Paclitaxel injection What is this medicine? PACLITAXEL (PAK li TAX el) is a chemotherapy drug. It targets fast dividing cells, like cancer cells, and causes these cells to die. This medicine is used to treat ovarian cancer, breast cancer, lung cancer, Kaposi's sarcoma, and other cancers. This medicine may be used for other purposes; ask your health care provider or pharmacist if you have questions. COMMON BRAND NAME(S): Onxol, Taxol What should I tell my health care provider before I take this medicine? They need to know if you have any of these conditions:  history of irregular heartbeat  liver disease  low blood counts, like low white cell, platelet, or red cell counts  lung or breathing disease, like asthma  tingling of the fingers or toes, or other nerve disorder  an unusual or allergic reaction to paclitaxel, alcohol, polyoxyethylated castor oil, other chemotherapy, other medicines, foods, dyes, or preservatives  pregnant or trying to get pregnant  breast-feeding How should I use this medicine? This drug is given as an infusion into a vein. It is administered in a hospital or clinic by a specially trained health care professional. Talk to your pediatrician regarding the use of this medicine in children. Special care may be needed. Overdosage: If you think you have taken too much of this medicine contact a poison control center or emergency room at once. NOTE: This medicine is only for you. Do not share this medicine with others. What if I miss a dose? It is important not to miss your dose. Call your doctor or health care professional if you are unable to keep an appointment. What may interact with this medicine? Do not take this medicine with any of the following medications:  live virus vaccines This medicine may also interact with the following medications:  antiviral medicines for hepatitis, HIV or AIDS  certain antibiotics like erythromycin and clarithromycin  certain  medicines for fungal infections like ketoconazole and itraconazole  certain medicines for seizures like carbamazepine, phenobarbital, phenytoin  gemfibrozil  nefazodone  rifampin  St. John's wort This list may not describe all possible interactions. Give your health care provider a list of all the medicines, herbs, non-prescription drugs, or dietary supplements you use. Also tell them if you smoke, drink alcohol, or use illegal drugs. Some items may interact with your medicine. What should I watch for while using this medicine? Your condition will be monitored carefully while you are receiving this medicine. You will need important blood work done while you are taking this medicine. This medicine can cause serious allergic reactions. To reduce your risk you will need to take other medicine(s) before treatment with this medicine. If you experience allergic reactions like skin rash, itching or hives, swelling of the face, lips, or tongue, tell your doctor or health care professional right away. In some cases, you may be given additional medicines to help with side effects. Follow all directions for their use. This drug may make you feel generally unwell. This is not uncommon, as chemotherapy can affect healthy cells as well as cancer cells. Report any side effects. Continue your course of treatment even though you feel ill unless your doctor tells you to stop. Call your doctor or health care professional for advice if you get a fever, chills or sore throat, or other symptoms of a cold or flu. Do not treat yourself. This drug decreases your body's ability to fight infections. Try to avoid being around people who are sick. This medicine may increase your risk to bruise   or bleed. Call your doctor or health care professional if you notice any unusual bleeding. Be careful brushing and flossing your teeth or using a toothpick because you may get an infection or bleed more easily. If you have any dental  work done, tell your dentist you are receiving this medicine. Avoid taking products that contain aspirin, acetaminophen, ibuprofen, naproxen, or ketoprofen unless instructed by your doctor. These medicines may hide a fever. Do not become pregnant while taking this medicine. Women should inform their doctor if they wish to become pregnant or think they might be pregnant. There is a potential for serious side effects to an unborn child. Talk to your health care professional or pharmacist for more information. Do not breast-feed an infant while taking this medicine. Men are advised not to father a child while receiving this medicine. This product may contain alcohol. Ask your pharmacist or healthcare provider if this medicine contains alcohol. Be sure to tell all healthcare providers you are taking this medicine. Certain medicines, like metronidazole and disulfiram, can cause an unpleasant reaction when taken with alcohol. The reaction includes flushing, headache, nausea, vomiting, sweating, and increased thirst. The reaction can last from 30 minutes to several hours. What side effects may I notice from receiving this medicine? Side effects that you should report to your doctor or health care professional as soon as possible:  allergic reactions like skin rash, itching or hives, swelling of the face, lips, or tongue  breathing problems  changes in vision  fast, irregular heartbeat  high or low blood pressure  mouth sores  pain, tingling, numbness in the hands or feet  signs of decreased platelets or bleeding - bruising, pinpoint red spots on the skin, black, tarry stools, blood in the urine  signs of decreased red blood cells - unusually weak or tired, feeling faint or lightheaded, falls  signs of infection - fever or chills, cough, sore throat, pain or difficulty passing urine  signs and symptoms of liver injury like dark yellow or brown urine; general ill feeling or flu-like symptoms;  light-colored stools; loss of appetite; nausea; right upper belly pain; unusually weak or tired; yellowing of the eyes or skin  swelling of the ankles, feet, hands  unusually slow heartbeat Side effects that usually do not require medical attention (report to your doctor or health care professional if they continue or are bothersome):  diarrhea  hair loss  loss of appetite  muscle or joint pain  nausea, vomiting  pain, redness, or irritation at site where injected  tiredness This list may not describe all possible side effects. Call your doctor for medical advice about side effects. You may report side effects to FDA at 1-800-FDA-1088. Where should I keep my medicine? This drug is given in a hospital or clinic and will not be stored at home. NOTE: This sheet is a summary. It may not cover all possible information. If you have questions about this medicine, talk to your doctor, pharmacist, or health care provider.  2021 [employer] (2019-08-29 13:37:23)  

## 2021-01-01 LAB — CANCER ANTIGEN 27.29: CA 27.29: 45.3 U/mL — ABNORMAL HIGH (ref 0.0–38.6)

## 2021-01-07 ENCOUNTER — Inpatient Hospital Stay: Payer: 59

## 2021-01-07 ENCOUNTER — Other Ambulatory Visit: Payer: Self-pay

## 2021-01-07 VITALS — BP 111/60 | HR 74 | Temp 98.1°F | Resp 17

## 2021-01-07 DIAGNOSIS — C787 Secondary malignant neoplasm of liver and intrahepatic bile duct: Secondary | ICD-10-CM

## 2021-01-07 DIAGNOSIS — Z5112 Encounter for antineoplastic immunotherapy: Secondary | ICD-10-CM | POA: Diagnosis not present

## 2021-01-07 DIAGNOSIS — C50411 Malignant neoplasm of upper-outer quadrant of right female breast: Secondary | ICD-10-CM

## 2021-01-07 DIAGNOSIS — Z7189 Other specified counseling: Secondary | ICD-10-CM

## 2021-01-07 DIAGNOSIS — Z170 Estrogen receptor positive status [ER+]: Secondary | ICD-10-CM

## 2021-01-07 LAB — CBC WITH DIFFERENTIAL/PLATELET
Abs Immature Granulocytes: 0.08 10*3/uL — ABNORMAL HIGH (ref 0.00–0.07)
Basophils Absolute: 0.1 10*3/uL (ref 0.0–0.1)
Basophils Relative: 1 %
Eosinophils Absolute: 0.1 10*3/uL (ref 0.0–0.5)
Eosinophils Relative: 3 %
HCT: 32.5 % — ABNORMAL LOW (ref 36.0–46.0)
Hemoglobin: 11.2 g/dL — ABNORMAL LOW (ref 12.0–15.0)
Immature Granulocytes: 2 %
Lymphocytes Relative: 21 %
Lymphs Abs: 0.9 10*3/uL (ref 0.7–4.0)
MCH: 29.9 pg (ref 26.0–34.0)
MCHC: 34.5 g/dL (ref 30.0–36.0)
MCV: 86.9 fL (ref 80.0–100.0)
Monocytes Absolute: 0.2 10*3/uL (ref 0.1–1.0)
Monocytes Relative: 4 %
Neutro Abs: 2.8 10*3/uL (ref 1.7–7.7)
Neutrophils Relative %: 69 %
Platelets: 216 10*3/uL (ref 150–400)
RBC: 3.74 MIL/uL — ABNORMAL LOW (ref 3.87–5.11)
RDW: 14 % (ref 11.5–15.5)
WBC: 4.1 10*3/uL (ref 4.0–10.5)
nRBC: 0 % (ref 0.0–0.2)

## 2021-01-07 LAB — COMPREHENSIVE METABOLIC PANEL
ALT: 26 U/L (ref 0–44)
AST: 22 U/L (ref 15–41)
Albumin: 3.8 g/dL (ref 3.5–5.0)
Alkaline Phosphatase: 72 U/L (ref 38–126)
Anion gap: 6 (ref 5–15)
BUN: 16 mg/dL (ref 6–20)
CO2: 26 mmol/L (ref 22–32)
Calcium: 9.2 mg/dL (ref 8.9–10.3)
Chloride: 105 mmol/L (ref 98–111)
Creatinine, Ser: 0.61 mg/dL (ref 0.44–1.00)
GFR, Estimated: >60 mL/min (ref >60)
Glucose, Bld: 150 mg/dL — ABNORMAL HIGH (ref 70–99)
Potassium: 4.1 mmol/L (ref 3.5–5.1)
Sodium: 137 mmol/L (ref 135–145)
Total Bilirubin: 0.9 mg/dL (ref 0.3–1.2)
Total Protein: 6.2 g/dL — ABNORMAL LOW (ref 6.5–8.1)

## 2021-01-07 LAB — TSH: TSH: 2.628 u[IU]/mL (ref 0.308–3.960)

## 2021-01-07 MED ORDER — FAMOTIDINE IN NACL 20-0.9 MG/50ML-% IV SOLN
20.0000 mg | Freq: Once | INTRAVENOUS | Status: AC
  Administered 2021-01-07: 20 mg via INTRAVENOUS

## 2021-01-07 MED ORDER — DEXAMETHASONE SODIUM PHOSPHATE 10 MG/ML IJ SOLN
4.0000 mg | Freq: Once | INTRAMUSCULAR | Status: AC
  Administered 2021-01-07: 4 mg via INTRAVENOUS

## 2021-01-07 MED ORDER — DEXAMETHASONE SODIUM PHOSPHATE 10 MG/ML IJ SOLN
INTRAMUSCULAR | Status: AC
  Filled 2021-01-07: qty 1

## 2021-01-07 MED ORDER — FAMOTIDINE IN NACL 20-0.9 MG/50ML-% IV SOLN
INTRAVENOUS | Status: AC
  Filled 2021-01-07: qty 50

## 2021-01-07 MED ORDER — SODIUM CHLORIDE 0.9 % IV SOLN
200.0000 mg | Freq: Once | INTRAVENOUS | Status: AC
  Administered 2021-01-07: 200 mg via INTRAVENOUS
  Filled 2021-01-07: qty 8

## 2021-01-07 MED ORDER — SODIUM CHLORIDE 0.9 % IV SOLN
90.0000 mg/m2 | Freq: Once | INTRAVENOUS | Status: AC
  Administered 2021-01-07: 186 mg via INTRAVENOUS
  Filled 2021-01-07: qty 31

## 2021-01-07 MED ORDER — SODIUM CHLORIDE 0.9% FLUSH
10.0000 mL | INTRAVENOUS | Status: DC | PRN
  Administered 2021-01-07: 10 mL
  Filled 2021-01-07: qty 10

## 2021-01-07 MED ORDER — DIPHENHYDRAMINE HCL 50 MG/ML IJ SOLN
INTRAMUSCULAR | Status: AC
  Filled 2021-01-07: qty 1

## 2021-01-07 MED ORDER — HEPARIN SOD (PORK) LOCK FLUSH 100 UNIT/ML IV SOLN
500.0000 [IU] | Freq: Once | INTRAVENOUS | Status: AC | PRN
  Administered 2021-01-07: 500 [IU]
  Filled 2021-01-07: qty 5

## 2021-01-07 MED ORDER — DIPHENHYDRAMINE HCL 50 MG/ML IJ SOLN
25.0000 mg | Freq: Once | INTRAMUSCULAR | Status: AC
  Administered 2021-01-07: 25 mg via INTRAVENOUS

## 2021-01-07 MED ORDER — SODIUM CHLORIDE 0.9 % IV SOLN
Freq: Once | INTRAVENOUS | Status: AC
  Filled 2021-01-07: qty 250

## 2021-01-07 NOTE — Patient Instructions (Signed)

## 2021-01-07 NOTE — Patient Instructions (Signed)
[facility] Discharge Instructions for Patients Receiving Chemotherapy  Today you received the following chemotherapy agents Taxol; Keytruda  To help prevent nausea and vomiting after your treatment, we encourage you to take your nausea medication as directed by MD.   If you develop nausea and vomiting that is not controlled by your nausea medication, call the clinic.   BELOW ARE SYMPTOMS THAT SHOULD BE REPORTED IMMEDIATELY:  *FEVER GREATER THAN 100.5 F  *CHILLS WITH OR WITHOUT FEVER  NAUSEA AND VOMITING THAT IS NOT CONTROLLED WITH YOUR NAUSEA MEDICATION  *UNUSUAL SHORTNESS OF BREATH  *UNUSUAL BRUISING OR BLEEDING  TENDERNESS IN MOUTH AND THROAT WITH OR WITHOUT PRESENCE OF ULCERS  *URINARY PROBLEMS  *BOWEL PROBLEMS  UNUSUAL RASH Items with * indicate a potential emergency and should be followed up as soon as possible.  Feel free to call the clinic should you have any questions or concerns. The clinic phone number is [phone].  Please show the [place] at check-in to the Emergency Department and triage nurse.

## 2021-01-08 LAB — CANCER ANTIGEN 27.29: CA 27.29: 42.8 U/mL — ABNORMAL HIGH (ref 0.0–38.6)

## 2021-01-20 NOTE — Progress Notes (Signed)
[facility]  [phone] [phone]     ID: Veronica [name] DOB: 12/29/1970  MR#: [id number]  CSN#:[id number]  Patient Care Team: Breedlove, Brent C, PA-C as PCP - General (Family Medicine) Stuart, Dawn C, RN as Oncology Nurse Navigator Martini, Keisha N, RN as Oncology Nurse Navigator Cornett, Thomas, MD as Consulting Physician (General Surgery) ,  C, MD as Consulting Physician (Oncology) Moody, John, MD as Consulting Physician (Radiation Oncology) Bartle, Bryan K, MD as Consulting Physician (Cardiothoracic Surgery) Brabham, Vance W, MD as Consulting Physician (Vascular Surgery) Douglas, Emily Hughes, MD as Referring Physician (Hematology and Oncology)  C , MD OTHER MD:  CHIEF COMPLAINT: Estrogen receptor positive breast cancer  CURRENT TREATMENT: Paclitaxel day 1 and day 8 of each 21-day cycle, pembrolizumab given with day 8 of each cycle; zoledronate every 12 weeks   INTERVAL HISTORY: Murna returns today for follow-up and treatment of her now metastatic breast cancer.   She started paclitaxel, given on days 1 and 8, every 21 days, on 11/20/2020.  She started pembrolizumab on 12/17/2020, and it will be continued every 3 weeks on the day 8 of each cycle.  Today is day 1 cycle 4 of her chemotherapy.  Her last zoledronate was 12/10/2020 and the next dose will be due at the end of May.  We are following her CA 27-29 which showing a very encouraging trend: Lab Results  Component Value Date   CA2729 42.8 (H) 01/07/2021   CA2729 45.3 (H) 12/31/2020   CA2729 52.6 (H) 12/10/2020   CA2729 73.9 (H) 11/19/2020   CA2729 104.2 (H) 10/29/2020   We are following her TSH  Lab Results  Component Value Date   TSH 2.628 01/07/2021   TSH 1.932 12/31/2020   TSH 1.924 11/19/2020    REVIEW OF SYSTEMS: Genora continues to tolerate treatment generally well.  She says that after her last chemotherapy dose she had a little bit of  twitching on the lateral aspect of her left eye.  This came and went for a couple of days and resolved without any intervention.  Was not accompanied by headache nausea vomiting visual changes balance issues or falls.  She does drink some coffee during the day and she is trying to cut back she says.  She and her husband went hiking and she enjoyed that although it was stressful going uphill she says.  She continues to have absolutely no peripheral neuropathy symptoms.  Rosacea is a little bit better on Vibra-Tabs.  She says her insurance would not approve the MetroGel.   COVID 19 VACCINATION STATUS: refuses vaccination, had COVID 19 December 48   HISTORY OF CURRENT ILLNESS: From the original intake note:  Veronica [name] had routine screening mammography on 04/27/2019 showing a possible abnormality in the right breast. She underwent right diagnostic mammography with tomography and right breast ultrasonography at [facility] on 05/01/2019 showing: breast density category C; highly suspicious 1.7 cm irregular mass in the right breast at 10 o'clock, 8 cm from the nipple; indeterminate hypoechoic round mass in the right axillary tail/low axilla. Physical exam showed no suspicious lumps.  Accordingly on 05/02/2019 she proceeded to biopsy of the right breast area in question. The pathology from this procedure ([id number]) showed: invasive ductal carcinoma, grade 3; ductal carcinoma in situ; lymphovascular invasion present. Prognostic indicators significant for: estrogen receptor, 30% positive with moderate staining intensity, and progesterone receptor, 30% positive with strong staining intensity. Proliferation marker Ki67 at 20%. HER2 negative by immunohistochemistry (  1+).  The second "satellite" lesion was a lymph node which was also positive.  The patient's subsequent history is as detailed below.   PAST MEDICAL HISTORY: Past Medical History:  Diagnosis Date  . Cancer (HCC) 2020   breast Cancer,  2022 liver mets  . Chronic low back pain with left-sided sciatica   . Family history of leukemia   . Family history of stomach cancer   . Hypertension   . Personal history of chemotherapy   . Personal history of radiation therapy    finished june '21    PAST SURGICAL HISTORY: Past Surgical History:  Procedure Laterality Date  . APPLICATION OF WOUND VAC N/A 10/16/2019   Procedure: APPLICATION OF WOUND VAC;  Surgeon: Bartle, Bryan K, MD;  Location: [facility];  Service: Vascular;  Laterality: N/A;  . BREAST BIOPSY Right 05/02/2019   right clips X2  . BREAST LUMPECTOMY Right    06/2019  . BREAST LUMPECTOMY WITH RADIOACTIVE SEED AND SENTINEL LYMPH NODE BIOPSY Right 06/12/2019   Procedure: RIGHT BREAST RADIOACTIVE SEED LUMPECTOMY  AND RIGHT AXILLARY SEED TARGETED LYMPH NODE AND AXILLARY SENTINEL LYMPH NODE MAPPING;  Surgeon: Cornett, Thomas, MD;  Location: [facility];  Service: General;  Laterality: Right;  PEC BLOCK  . C/S x2  '98 '03  . CENTRAL VENOUS CATHETER INSERTION  10/02/2019   Procedure: Insertion Central Line Adult;  Surgeon: Brabham, Vance W, MD;  Location: [facility];  Service: Vascular;;  . CESAREAN SECTION    . CHOLECYSTECTOMY    . I & D EXTREMITY N/A 10/16/2019   Procedure: DEBRIDEMENT of DEHISCED MIDLINE SURGICAL INCISION;  Surgeon: Bartle, Bryan K, MD;  Location: [facility];  Service: Vascular;  Laterality: N/A;  . IR IMAGING GUIDED PORT INSERTION  11/24/2020  . LAPAROSCOPIC ASSISTED VAGINAL HYSTERECTOMY  05/17/2011   Procedure: LAPAROSCOPIC ASSISTED VAGINAL HYSTERECTOMY;  Surgeon: Richard D Kaplan;  Location: [facility];  Service: Gynecology;  Laterality: N/A;  Laparoscopic Assisted Vaginal Hysterectomy With Lysis Of Adhesions  . PERICARDIAL WINDOW N/A 10/02/2019   Procedure: Pericardial Window;  Surgeon: Brabham, Vance W, MD;  Location: [facility];  Service: Vascular;  Laterality: N/A;  . PORT-A-CATH REMOVAL  10/02/2019   Procedure: Removal Port-A-Cath;  Surgeon: Brabham, Vance W, MD;   Location: [facility];  Service: Vascular;;  . PORTACATH PLACEMENT Right 06/12/2019   Procedure: INSERTION PORT-A-CATH WITH ULTRASOUND;  Surgeon: Cornett, Thomas, MD;  Location: [facility];  Service: General;  Laterality: Right;  . RE-EXCISION OF BREAST LUMPECTOMY Right 07/17/2019   Procedure: RE-EXCISION OF RIGHT BREAST LUMPECTOMY;  Surgeon: Cornett, Thomas, MD;  Location: [facility];  Service: General;  Laterality: Right;  . TUBAL LIGATION    . ULTRASOUND GUIDANCE FOR VASCULAR ACCESS  10/02/2019   Procedure: Ultrasound Guidance For Vascular Access;  Surgeon: Brabham, Vance W, MD;  Location: [facility];  Service: Vascular;;  . VENOGRAM N/A 10/02/2019   Procedure: Central VENOGRAM;  Surgeon: Brabham, Vance W, MD;  Location: [facility];  Service: Vascular;  Laterality: N/A;    FAMILY HISTORY: Family History  Problem Relation Age of Onset  . Arthritis Mother   . COPD Mother   . Hypertension Mother   . Hyperlipidemia Mother   . Leukemia Brother 33  . Stomach cancer Maternal Grandmother        late 70s  Patient's parents are living (as of September 2020). Her father is 79 and her mother is 70 as of 04/2019. The patient denies a family hx   of breast or ovarian cancer. She has 7 siblings, 6 brothers and 1 sister. She reports her maternal grandmother was diagnosed with stomach cancer at age 34. Her brother was diagnosed with leukemia at age 93.   GYNECOLOGIC HISTORY:  No LMP recorded. Patient has had a hysterectomy. Menarche: 49 years old Age at first live birth: 50 years old GX P 2 (+1 stillborn) LMP age 40-42 Contraceptive: unsure, maybe for a year at age 6. HRT no Hysterectomy? Yes, 05/2011 BSO? 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 She is married. Husband Sheppard Plumber") is a Hydrologist for a telephone company. She lives at home with her husband and two daughters. Daughter Suszanne Conners, age 63, is a Scientist, water quality. Daughter  Jarrett Soho, age 4, is a Ship broker.     ADVANCED DIRECTIVES: Husband Heath Lark is her HCPOA.   HEALTH MAINTENANCE: Social History   Tobacco Use  . Smoking status: Never Smoker  . Smokeless tobacco: Never Used  Vaping Use  . Vaping Use: Never used  Substance Use Topics  . Alcohol use: Not Currently    Alcohol/week: 2.0 standard drinks    Types: 2 Standard drinks or equivalent per week    Comment: "2-3 a week"  . Drug use: No     Colonoscopy: n/a  PAP: 02/2018  Bone density: n/a   No Known Allergies  Current Outpatient Medications  Medication Sig Dispense Refill  . Cholecalciferol (VITAMIN D3) 25 MCG (1000 UT) CHEW Chew 1 tablet by mouth daily.     Marland Kitchen doxycycline (VIBRA-TABS) 100 MG tablet Take 1 tablet (100 mg total) by mouth daily. 30 tablet 4  . gabapentin (NEURONTIN) 300 MG capsule Take 1 capsule (300 mg total) by mouth at bedtime as needed. 90 capsule 4  . metoprolol tartrate (LOPRESSOR) 25 MG tablet Take 1 tablet (25 mg total) by mouth 2 (two) times daily. 60 tablet 0  . metroNIDAZOLE (METROGEL) 1 % gel Apply topically daily. 45 g 0  . ondansetron (ZOFRAN) 8 MG tablet Take 1 tablet (8 mg total) by mouth 2 (two) times daily as needed (Nausea or vomiting). 30 tablet 1  . prochlorperazine (COMPAZINE) 10 MG tablet Take 1 tablet (10 mg total) by mouth every 6 (six) hours as needed (Nausea or vomiting). 90 tablet 1  . rivaroxaban (XARELTO) 20 MG TABS tablet Take 1 tablet (20 mg total) by mouth daily with supper. 90 tablet 3   No current facility-administered medications for this visit.    OBJECTIVE: white woman in no acute distress  Vitals:   01/21/21 0818  BP: 113/70  Pulse: 76  Resp: 18  Temp: (!) 97.5 F (36.4 C)  SpO2: 100%   Wt Readings from Last 3 Encounters:  12/31/20 224 lb 9.6 oz (101.9 kg)  12/17/20 221 lb 8 oz (100.5 kg)  12/10/20 222 lb 12 oz (101 kg)   Body mass index is 42.44 kg/m.    ECOG FS:1 - Symptomatic but completely ambulatory  Sclerae unicteric,  EOMs intact Wearing a mask No cervical or supraclavicular adenopathy Lungs no rales or rhonchi Heart regular rate and rhythm Abd soft, nontender, positive bowel sounds MSK no focal spinal tenderness, no upper extremity lymphedema Neuro: nonfocal, well oriented, appropriate affect Breasts: Deferred   LAB RESULTS:  CMP     Component Value Date/Time   NA 141 01/21/2021 0807   K 4.1 01/21/2021 0807   CL 110 01/21/2021 0807   CO2 20 (L) 01/21/2021 0807   GLUCOSE  150 (H) 01/21/2021 0807   BUN 15 01/21/2021 0807   CREATININE 0.67 01/21/2021 0807   CREATININE 0.75 04/24/2020 1035   CALCIUM 8.9 01/21/2021 0807   PROT 6.3 (L) 01/21/2021 0807   ALBUMIN 3.5 01/21/2021 0807   AST 22 01/21/2021 0807   AST 23 04/24/2020 1035   ALT 20 01/21/2021 0807   ALT 20 04/24/2020 1035   ALKPHOS 74 01/21/2021 0807   BILITOT 1.0 01/21/2021 0807   BILITOT 1.1 04/24/2020 1035   GFRNONAA >60 01/21/2021 0807   GFRNONAA >60 04/24/2020 1035   GFRAA >60 04/24/2020 1035    No results found for: TOTALPROTELP, ALBUMINELP, A1GS, A2GS, BETS, BETA2SER, GAMS, MSPIKE, SPEI  No results found for: KPAFRELGTCHN, LAMBDASER, KAPLAMBRATIO  Lab Results  Component Value Date   WBC 2.9 (L) 01/21/2021   NEUTROABS 1.4 (L) 01/21/2021   HGB 11.4 (L) 01/21/2021   HCT 34.8 (L) 01/21/2021   MCV 88.3 01/21/2021   PLT 212 01/21/2021   No results found for: LABCA2  No components found for: LABCAN125  No results for input(s): INR in the last 168 hours.  No results found for: LABCA2  No results found for: CAN199  No results found for: CAN125  No results found for: CAN153  Lab Results  Component Value Date   CA2729 42.8 (H) 01/07/2021    No components found for: HGQUANT  Lab Results  Component Value Date   CEA1 2.45 10/29/2020   /  CEA ([facility]In [facility])  Date Value Ref Range Status  10/29/2020 2.45 0.00 - 5.00 ng/mL Final    Comment:    (NOTE) This test was performed using Architect's  Chemiluminescent Microparticle Immunoassay. Values obtained from different assay methods cannot be used interchangeably. Please note that 5-10% of patients who smoke may see CEA levels up to 6.9 ng/mL. Performed at [facility], [address] [place], [place] [address]      No results found for: AFPTUMOR  No results found for: CHROMOGRNA  No results found for: HGBA, HGBA2QUANT, HGBFQUANT, HGBSQUAN (Hemoglobinopathy evaluation)   Lab Results  Component Value Date   LDH 170 09/30/2019    No results found for: IRON, TIBC, IRONPCTSAT (Iron and TIBC)  Lab Results  Component Value Date   FERRITIN 498 (H) 10/19/2019    Urinalysis    Component Value Date/Time   COLORURINE YELLOW 08/28/2019 2326   APPEARANCEUR CLOUDY (A) 08/28/2019 2326   LABSPEC 1.013 08/28/2019 2326   PHURINE 5.0 08/28/2019 2326   GLUCOSEU NEGATIVE 08/28/2019 2326   HGBUR MODERATE (A) 08/28/2019 2326   BILIRUBINUR NEGATIVE 08/28/2019 2326   KETONESUR NEGATIVE 08/28/2019 2326   PROTEINUR NEGATIVE 08/28/2019 2326   NITRITE NEGATIVE 08/28/2019 2326   LEUKOCYTESUR MODERATE (A) 08/28/2019 2326    STUDIES: No results found.   ELIGIBLE FOR AVAILABLE RESEARCH PROTOCOL: AET  ASSESSMENT: 49 y.o. [place], [place] woman status post right breast upper outer quadrant biopsy 05/01/2019 for a clinical T1c N1, stage IIA invasive ductal carcinoma, grade 3, estrogen and progesterone receptor positive, HER-2 nonamplified, with an MIB-1-1 of 20%.  (a) mass in the axillary tail was a positive lymph node  (1) MammaPrint obtained from the original biopsy shows a high risk luminal B tumor and predicts a 5-year disease-free survival of 91% in her case  (2) genetics testing 05/09/2019 through the Common Hereditary Gene Panel offered by [facility] found no deleterious mutations in APC, ATM, AXIN2, BARD1, BMPR1A, BRCA1, BRCA2, BRIP1, CDH1, CDK4, CDKN2A (p14ARF), CDKN2A (p16INK4a), CHEK2, CTNNA1, DICER1,   EPCAM  (Deletion/duplication testing only), GREM1 (promoter region deletion/duplication testing only), KIT, MEN1, MLH1, MSH2, MSH3, MSH6, MUTYH, NBN, NF1, NHTL1, PALB2, PDGFRA, PMS2, POLD1, POLE, PTEN, RAD50, RAD51C, RAD51D, RNF43, SDHB, SDHC, SDHD, SMAD4, SMARCA4. STK11, TP53, TSC1, TSC2, and VHL.  The following genes were evaluated for sequence changes only: SDHA and HOXB13 c.251G>A variant only.   (a) A variant of uncertain significance (VUS) was detected in one of her MSH6 genes (c.831A>C).  (3) status post right lumpectomy and sentinel lymph node sampling 06/12/2019 for a pT2 pN1, stage IIA invasive ductal carcinoma, grade 2, with positive margins  (a) a total of 4 sentinel lymph nodes removed, one positive (with ECE), ine itc  (b) margin clearance 04/19/2019 successful (medial margin close but negative for DCIS)  (4) adjuvant chemotherapy consisting of doxorubicin and cyclophosphamide in dose dense fashion x4 starting 07/10/2019, completed 09/24/2019; planned weekly paclitaxel x12 omitted   (a) echo 06/26/2019 shows an EF of 60-65%  (b echo on 09/19/2019 shows an EF of 60-65%  (c) chemotherapy stopped after four cycles of doxorubicin and cyclophosphamide due to #5 below  (5) Multiple VTE/PE documented 10/02/2019, on intravenous heparin initially, then Xarelto started 10/19/2019  (a) presented with SVC syndrome and bilateral pulmonary emboli on 09/28/2019  (b) s/p SVC thrombectomy and port removal 84/69/6295 complicated by hemopericardium and tamponade, necessitating pericardial window placement   (c) postop course complicated by wound dehiscence requiring wound VAC  (c) Doppler 10/03/2019 showed right lower extremity DVT  (d) CT angio and Doppler of both upper extremities 10/13/2019 found persistent acute bilateral pulmonary emboli, persistent but improved SVC thrombus, bilateral pleural effusions, and right lower lobe collapse. Bilateral upper extremity DVTs also noted  (e) Dopplers upper extremity  11/19/2019 showed clots cleared on the left, chronic DVT right internal jugular and axillary veins  (f) CT angio of the chest 10/07/2020 shows resolution of earlier pleural effusions and no evidence of active embolism (but see #9 below)  (6) COVID-19 infection documented 09/27/2020, status post remdesivir  (7) adjuvant radiation 12/24/19 - 02/06/20 Site/dose:   The patient initially received a dose of 50.4 Gy in 28 fractions to the breast and SCLV region using a 4-field approach. This was delivered using a 3-D conformal technique. The patient then received a boost to the seroma. This delivered an additional 10 Gy in 5 fractions using a 3-field photon boost technique. The total dose was 60.4 Gy.  (8) anastrozole started mid May 2021  (a) [facility] and estradiol obtained 11/15/2019 consistent with menopause  (b) not a good candidate for tamoxifen given history of DVT/PE above  (c) bone density 07/31/2020 normal (T score equals 0.1).  METASTATIC DISEASE: Jan 2022 (9) CT angio of the chest 12/28/2021shows new liver lesions (not seen on CT angio January 2021)  (a) MRI 10/22/2020 shows multiple liver lesions, the largest 2.5 cm  (b)   liver biopsy 11/03/2020 confirms metastatic carcinoma estrogen receptor 10% positive with weak staining intensity, HER-2 and progesterone receptor negative.  (c) bone scan 11/25/2020 shows left femoral lesion as well as other lesions   (i) left hip films not suggestive of impending pathologic fracture  (d) non-contrast head CT 11/25/2020 negative except for sinusitis  (e) CA 27-29 is informative (was 104.1 on 10/29/2020)  (10) foundation 1 study requested on 11/03/2020 liver biopsy dated 11/03/2020 shows PI K3 CA amplification, and a T p53 mutation.  There were no mutations in BRCA1 and BRCA2 or HER-2.  There is amplification of PRK CI, S0X2, T ERC,  and FGF 1 2.  The microsatellite status is stable.  The tumor mutational burden is 4 mutations per Mb  (11) started  paclitaxel 11/19/2018 2012, repeated day 1 day 8 of every 21-day cycle  (a) pembrolizumab added 12/17/2020, repeated every 21 days  (12) zoledronate added 12/10/2020, repeated every 12 weeks   PLAN:  Aarian continues to tolerate chemotherapy well and we are beginning cycle 4 today.  Before starting cycle 5 she will be restaged with liver MRI.  I anticipate evidence of response since her tumor markers have greatly decreased.  I do not think the eye twitching she had is related to her chemo.  More likely is related to caffeine but in any case it has resolved.  She knows to call for any other issue that may develop before the next visit  Total encounter time 25 minutes.*   C. , MD Medical Oncology and Hematology [facility] [address] [place], [place] [address] Tel. [phone]    Fax. [phone]   I, Katie Daubenspeck, am acting as scribe for Dr.  C. .  I,   MD, have reviewed the above documentation for accuracy and completeness, and I agree with the above.   *Total Encounter Time as defined by the Centers for Medicare and Medicaid Services includes, in addition to the face-to-face time of a patient visit (documented in the note above) non-face-to-face time: obtaining and reviewing outside history, ordering and reviewing medications, tests or procedures, care coordination (communications with other health care professionals or caregivers) and documentation in the medical record. 

## 2021-01-21 ENCOUNTER — Inpatient Hospital Stay (HOSPITAL_BASED_OUTPATIENT_CLINIC_OR_DEPARTMENT_OTHER): Payer: 59 | Admitting: Oncology

## 2021-01-21 ENCOUNTER — Inpatient Hospital Stay: Payer: 59 | Attending: Oncology

## 2021-01-21 ENCOUNTER — Inpatient Hospital Stay: Payer: 59

## 2021-01-21 ENCOUNTER — Other Ambulatory Visit: Payer: Self-pay

## 2021-01-21 VITALS — BP 113/70 | HR 76 | Temp 97.5°F | Resp 18 | Ht 61.0 in

## 2021-01-21 VITALS — Wt 225.0 lb

## 2021-01-21 DIAGNOSIS — Z170 Estrogen receptor positive status [ER+]: Secondary | ICD-10-CM

## 2021-01-21 DIAGNOSIS — C787 Secondary malignant neoplasm of liver and intrahepatic bile duct: Secondary | ICD-10-CM

## 2021-01-21 DIAGNOSIS — Z5111 Encounter for antineoplastic chemotherapy: Secondary | ICD-10-CM | POA: Insufficient documentation

## 2021-01-21 DIAGNOSIS — C773 Secondary and unspecified malignant neoplasm of axilla and upper limb lymph nodes: Secondary | ICD-10-CM | POA: Diagnosis not present

## 2021-01-21 DIAGNOSIS — Z79899 Other long term (current) drug therapy: Secondary | ICD-10-CM | POA: Insufficient documentation

## 2021-01-21 DIAGNOSIS — Z5112 Encounter for antineoplastic immunotherapy: Secondary | ICD-10-CM | POA: Diagnosis not present

## 2021-01-21 DIAGNOSIS — C7951 Secondary malignant neoplasm of bone: Secondary | ICD-10-CM | POA: Insufficient documentation

## 2021-01-21 DIAGNOSIS — C50411 Malignant neoplasm of upper-outer quadrant of right female breast: Secondary | ICD-10-CM | POA: Insufficient documentation

## 2021-01-21 LAB — CBC WITH DIFFERENTIAL/PLATELET
Abs Immature Granulocytes: 0.03 10*3/uL (ref 0.00–0.07)
Basophils Absolute: 0 10*3/uL (ref 0.0–0.1)
Basophils Relative: 1 %
Eosinophils Absolute: 0.1 10*3/uL (ref 0.0–0.5)
Eosinophils Relative: 3 %
HCT: 34.8 % — ABNORMAL LOW (ref 36.0–46.0)
Hemoglobin: 11.4 g/dL — ABNORMAL LOW (ref 12.0–15.0)
Immature Granulocytes: 1 %
Lymphocytes Relative: 23 %
Lymphs Abs: 0.7 10*3/uL (ref 0.7–4.0)
MCH: 28.9 pg (ref 26.0–34.0)
MCHC: 32.8 g/dL (ref 30.0–36.0)
MCV: 88.3 fL (ref 80.0–100.0)
Monocytes Absolute: 0.6 10*3/uL (ref 0.1–1.0)
Monocytes Relative: 22 %
Neutro Abs: 1.4 10*3/uL — ABNORMAL LOW (ref 1.7–7.7)
Neutrophils Relative %: 50 %
Platelets: 212 10*3/uL (ref 150–400)
RBC: 3.94 MIL/uL (ref 3.87–5.11)
RDW: 14.8 % (ref 11.5–15.5)
WBC: 2.9 10*3/uL — ABNORMAL LOW (ref 4.0–10.5)
nRBC: 0 % (ref 0.0–0.2)

## 2021-01-21 LAB — COMPREHENSIVE METABOLIC PANEL
ALT: 20 U/L (ref 0–44)
AST: 22 U/L (ref 15–41)
Albumin: 3.5 g/dL (ref 3.5–5.0)
Alkaline Phosphatase: 74 U/L (ref 38–126)
Anion gap: 11 (ref 5–15)
BUN: 15 mg/dL (ref 6–20)
CO2: 20 mmol/L — ABNORMAL LOW (ref 22–32)
Calcium: 8.9 mg/dL (ref 8.9–10.3)
Chloride: 110 mmol/L (ref 98–111)
Creatinine, Ser: 0.67 mg/dL (ref 0.44–1.00)
GFR, Estimated: >60 mL/min (ref >60)
Glucose, Bld: 150 mg/dL — ABNORMAL HIGH (ref 70–99)
Potassium: 4.1 mmol/L (ref 3.5–5.1)
Sodium: 141 mmol/L (ref 135–145)
Total Bilirubin: 1 mg/dL (ref 0.3–1.2)
Total Protein: 6.3 g/dL — ABNORMAL LOW (ref 6.5–8.1)

## 2021-01-21 MED ORDER — FAMOTIDINE IN NACL 20-0.9 MG/50ML-% IV SOLN
INTRAVENOUS | Status: AC
  Filled 2021-01-21: qty 50

## 2021-01-21 MED ORDER — SODIUM CHLORIDE 0.9 % IV SOLN
Freq: Once | INTRAVENOUS | Status: AC
  Filled 2021-01-21: qty 250

## 2021-01-21 MED ORDER — HEPARIN SOD (PORK) LOCK FLUSH 100 UNIT/ML IV SOLN
500.0000 [IU] | Freq: Once | INTRAVENOUS | Status: AC | PRN
  Administered 2021-01-21: 500 [IU]
  Filled 2021-01-21: qty 5

## 2021-01-21 MED ORDER — DIPHENHYDRAMINE HCL 50 MG/ML IJ SOLN
INTRAMUSCULAR | Status: AC
  Filled 2021-01-21: qty 1

## 2021-01-21 MED ORDER — SODIUM CHLORIDE 0.9 % IV SOLN
90.0000 mg/m2 | Freq: Once | INTRAVENOUS | Status: AC
  Administered 2021-01-21: 186 mg via INTRAVENOUS
  Filled 2021-01-21: qty 31

## 2021-01-21 MED ORDER — DEXAMETHASONE SODIUM PHOSPHATE 10 MG/ML IJ SOLN
INTRAMUSCULAR | Status: AC
  Filled 2021-01-21: qty 1

## 2021-01-21 MED ORDER — SODIUM CHLORIDE 0.9% FLUSH
10.0000 mL | INTRAVENOUS | Status: DC | PRN
  Administered 2021-01-21: 10 mL
  Filled 2021-01-21: qty 10

## 2021-01-21 MED ORDER — FAMOTIDINE IN NACL 20-0.9 MG/50ML-% IV SOLN
20.0000 mg | Freq: Once | INTRAVENOUS | Status: AC
  Administered 2021-01-21: 20 mg via INTRAVENOUS

## 2021-01-21 MED ORDER — DIPHENHYDRAMINE HCL 50 MG/ML IJ SOLN
25.0000 mg | Freq: Once | INTRAMUSCULAR | Status: AC
  Administered 2021-01-21: 25 mg via INTRAVENOUS

## 2021-01-21 MED ORDER — DEXAMETHASONE SODIUM PHOSPHATE 10 MG/ML IJ SOLN
4.0000 mg | Freq: Once | INTRAMUSCULAR | Status: AC
Start: 2021-01-21 — End: 2021-01-21
  Administered 2021-01-21: 4 mg via INTRAVENOUS

## 2021-01-21 NOTE — Progress Notes (Signed)
Per MD okay to treat with ANC 1.4

## 2021-01-21 NOTE — Patient Instructions (Signed)
[facility] Discharge Instructions for Patients Receiving Chemotherapy  Today you received the following chemotherapy agents:  Taxol.  To help prevent nausea and vomiting after your treatment, we encourage you to take your nausea medication as directed.   If you develop nausea and vomiting that is not controlled by your nausea medication, call the clinic.   BELOW ARE SYMPTOMS THAT SHOULD BE REPORTED IMMEDIATELY:  *FEVER GREATER THAN 100.5 F  *CHILLS WITH OR WITHOUT FEVER  NAUSEA AND VOMITING THAT IS NOT CONTROLLED WITH YOUR NAUSEA MEDICATION  *UNUSUAL SHORTNESS OF BREATH  *UNUSUAL BRUISING OR BLEEDING  TENDERNESS IN MOUTH AND THROAT WITH OR WITHOUT PRESENCE OF ULCERS  *URINARY PROBLEMS  *BOWEL PROBLEMS  UNUSUAL RASH Items with * indicate a potential emergency and should be followed up as soon as possible.  Feel free to call the clinic should you have any questions or concerns. The clinic phone number is [phone].  Please show the CHEMO ALERT CARD at check-in to the Emergency Department and triage nurse.   

## 2021-01-21 NOTE — Patient Instructions (Signed)

## 2021-01-26 ENCOUNTER — Telehealth: Payer: Self-pay | Admitting: Oncology

## 2021-01-26 NOTE — Telephone Encounter (Signed)
Scheduled per 4/13 los. Pt will receive an updated appt calendar per next visit appt notes

## 2021-01-28 ENCOUNTER — Inpatient Hospital Stay: Payer: 59

## 2021-01-28 ENCOUNTER — Telehealth: Payer: Self-pay | Admitting: Oncology

## 2021-01-28 ENCOUNTER — Other Ambulatory Visit: Payer: 59

## 2021-01-28 ENCOUNTER — Other Ambulatory Visit: Payer: Self-pay

## 2021-01-28 VITALS — BP 112/68 | HR 72 | Temp 98.2°F | Resp 18

## 2021-01-28 DIAGNOSIS — Z7189 Other specified counseling: Secondary | ICD-10-CM

## 2021-01-28 DIAGNOSIS — C787 Secondary malignant neoplasm of liver and intrahepatic bile duct: Secondary | ICD-10-CM

## 2021-01-28 DIAGNOSIS — Z170 Estrogen receptor positive status [ER+]: Secondary | ICD-10-CM

## 2021-01-28 DIAGNOSIS — Z5112 Encounter for antineoplastic immunotherapy: Secondary | ICD-10-CM | POA: Diagnosis not present

## 2021-01-28 DIAGNOSIS — C50411 Malignant neoplasm of upper-outer quadrant of right female breast: Secondary | ICD-10-CM

## 2021-01-28 DIAGNOSIS — Z95828 Presence of other vascular implants and grafts: Secondary | ICD-10-CM

## 2021-01-28 LAB — CBC WITH DIFFERENTIAL/PLATELET
Abs Immature Granulocytes: 0.07 10*3/uL (ref 0.00–0.07)
Basophils Absolute: 0 10*3/uL (ref 0.0–0.1)
Basophils Relative: 1 %
Eosinophils Absolute: 0.1 10*3/uL (ref 0.0–0.5)
Eosinophils Relative: 2 %
HCT: 31.9 % — ABNORMAL LOW (ref 36.0–46.0)
Hemoglobin: 10.9 g/dL — ABNORMAL LOW (ref 12.0–15.0)
Immature Granulocytes: 2 %
Lymphocytes Relative: 20 %
Lymphs Abs: 0.8 10*3/uL (ref 0.7–4.0)
MCH: 29.6 pg (ref 26.0–34.0)
MCHC: 34.2 g/dL (ref 30.0–36.0)
MCV: 86.7 fL (ref 80.0–100.0)
Monocytes Absolute: 0.2 10*3/uL (ref 0.1–1.0)
Monocytes Relative: 6 %
Neutro Abs: 2.8 10*3/uL (ref 1.7–7.7)
Neutrophils Relative %: 69 %
Platelets: 227 10*3/uL (ref 150–400)
RBC: 3.68 MIL/uL — ABNORMAL LOW (ref 3.87–5.11)
RDW: 13.5 % (ref 11.5–15.5)
WBC: 4 10*3/uL (ref 4.0–10.5)
nRBC: 0 % (ref 0.0–0.2)

## 2021-01-28 LAB — TSH: TSH: <0.08 u[IU]/mL — ABNORMAL LOW (ref 0.308–3.960)

## 2021-01-28 LAB — COMPREHENSIVE METABOLIC PANEL
ALT: 24 U/L (ref 0–44)
AST: 22 U/L (ref 15–41)
Albumin: 3.5 g/dL (ref 3.5–5.0)
Alkaline Phosphatase: 88 U/L (ref 38–126)
Anion gap: 8 (ref 5–15)
BUN: 15 mg/dL (ref 6–20)
CO2: 24 mmol/L (ref 22–32)
Calcium: 9.2 mg/dL (ref 8.9–10.3)
Chloride: 108 mmol/L (ref 98–111)
Creatinine, Ser: 0.69 mg/dL (ref 0.44–1.00)
GFR, Estimated: >60 mL/min (ref >60)
Glucose, Bld: 178 mg/dL — ABNORMAL HIGH (ref 70–99)
Potassium: 4.1 mmol/L (ref 3.5–5.1)
Sodium: 140 mmol/L (ref 135–145)
Total Bilirubin: 0.8 mg/dL (ref 0.3–1.2)
Total Protein: 6.3 g/dL — ABNORMAL LOW (ref 6.5–8.1)

## 2021-01-28 MED ORDER — DIPHENHYDRAMINE HCL 50 MG/ML IJ SOLN
INTRAMUSCULAR | Status: AC
  Filled 2021-01-28: qty 1

## 2021-01-28 MED ORDER — DEXAMETHASONE SODIUM PHOSPHATE 10 MG/ML IJ SOLN
INTRAMUSCULAR | Status: AC
  Filled 2021-01-28: qty 1

## 2021-01-28 MED ORDER — SODIUM CHLORIDE 0.9 % IV SOLN
90.0000 mg/m2 | Freq: Once | INTRAVENOUS | Status: AC
  Administered 2021-01-28: 186 mg via INTRAVENOUS
  Filled 2021-01-28: qty 31

## 2021-01-28 MED ORDER — DEXAMETHASONE SODIUM PHOSPHATE 10 MG/ML IJ SOLN
4.0000 mg | Freq: Once | INTRAMUSCULAR | Status: AC
  Administered 2021-01-28: 4 mg via INTRAVENOUS

## 2021-01-28 MED ORDER — FAMOTIDINE IN NACL 20-0.9 MG/50ML-% IV SOLN
20.0000 mg | Freq: Once | INTRAVENOUS | Status: AC
  Administered 2021-01-28: 20 mg via INTRAVENOUS

## 2021-01-28 MED ORDER — SODIUM CHLORIDE 0.9% FLUSH
10.0000 mL | INTRAVENOUS | Status: DC | PRN
  Administered 2021-01-28: 10 mL via INTRAVENOUS
  Filled 2021-01-28: qty 10

## 2021-01-28 MED ORDER — FAMOTIDINE IN NACL 20-0.9 MG/50ML-% IV SOLN
INTRAVENOUS | Status: AC
  Filled 2021-01-28: qty 50

## 2021-01-28 MED ORDER — SODIUM CHLORIDE 0.9 % IV SOLN
Freq: Once | INTRAVENOUS | Status: DC
  Filled 2021-01-28: qty 250

## 2021-01-28 MED ORDER — DIPHENHYDRAMINE HCL 50 MG/ML IJ SOLN
25.0000 mg | Freq: Once | INTRAMUSCULAR | Status: AC
  Administered 2021-01-28: 25 mg via INTRAVENOUS

## 2021-01-28 MED ORDER — SODIUM CHLORIDE 0.9 % IV SOLN
Freq: Once | INTRAVENOUS | Status: AC
Start: 2021-01-28 — End: 2021-01-28
  Filled 2021-01-28: qty 250

## 2021-01-28 MED ORDER — SODIUM CHLORIDE 0.9 % IV SOLN
200.0000 mg | Freq: Once | INTRAVENOUS | Status: AC
  Administered 2021-01-28: 200 mg via INTRAVENOUS
  Filled 2021-01-28: qty 8

## 2021-01-28 NOTE — Telephone Encounter (Signed)
Spoke to patient regarding schedule. Patient stated she thought she was supposed to see the provider today and that she was finished with treatments. Informed patient I would pass along her concerns to the provider.

## 2021-01-29 ENCOUNTER — Other Ambulatory Visit: Payer: Self-pay | Admitting: Oncology

## 2021-01-29 LAB — CANCER ANTIGEN 27.29: CA 27.29: 42 U/mL — ABNORMAL HIGH (ref 0.0–38.6)

## 2021-01-29 NOTE — Progress Notes (Signed)
I had a note that Veronica [name] thought she was done with chemo.  Actually what she understood correctly is that we would discuss chemo or no chemo or what to do after we have the liver MRI results.  She is seeing me May 4 2 discussed that and she is scheduled for treatment the same day depending on our discussion outcome.  I have sent a second LOS to get that MRI of the liver scheduled.

## 2021-02-03 ENCOUNTER — Emergency Department (HOSPITAL_BASED_OUTPATIENT_CLINIC_OR_DEPARTMENT_OTHER)
Admission: EM | Admit: 2021-02-03 | Discharge: 2021-02-03 | Disposition: A | Discharge status: Home / Self Care | Payer: 59 | Attending: Emergency Medicine | Admitting: Emergency Medicine

## 2021-02-03 ENCOUNTER — Other Ambulatory Visit: Payer: Self-pay

## 2021-02-03 DIAGNOSIS — I10 Essential (primary) hypertension: Secondary | ICD-10-CM | POA: Diagnosis not present

## 2021-02-03 DIAGNOSIS — Z853 Personal history of malignant neoplasm of breast: Secondary | ICD-10-CM | POA: Diagnosis not present

## 2021-02-03 DIAGNOSIS — Z8505 Personal history of malignant neoplasm of liver: Secondary | ICD-10-CM | POA: Diagnosis not present

## 2021-02-03 DIAGNOSIS — Z8616 Personal history of COVID-19: Secondary | ICD-10-CM | POA: Insufficient documentation

## 2021-02-03 DIAGNOSIS — K130 Diseases of lips: Secondary | ICD-10-CM | POA: Diagnosis present

## 2021-02-03 DIAGNOSIS — R238 Other skin changes: Secondary | ICD-10-CM | POA: Diagnosis not present

## 2021-02-03 DIAGNOSIS — Z7901 Long term (current) use of anticoagulants: Secondary | ICD-10-CM | POA: Diagnosis not present

## 2021-02-03 DIAGNOSIS — S00521A Blister (nonthermal) of lip, initial encounter: Secondary | ICD-10-CM

## 2021-02-03 DIAGNOSIS — Z85830 Personal history of malignant neoplasm of bone: Secondary | ICD-10-CM | POA: Diagnosis not present

## 2021-02-03 NOTE — ED Provider Notes (Signed)
[place] EMERGENCY DEPT Provider Note   CSN: [id number] Arrival date & time: 02/03/21  2026     History Chief Complaint  Patient presents with  . Lip Laceration    Veronica [name] is a 50 y.o. female.  50 yo F with a chief complaints of bleeding from her left lower lip.  This started spontaneously.  She had a significant sun exposure today and felt like she was sunburned she went into a department store and had rubbed her hands with hand sanitizer when she realized that her lip was bleeding.  Her husband then decided to bring her to the hospital to be evaluated.  She was having significant difficulty controlling the bleeding until just before arrival.  She denies any prior injury.  Denies obvious laceration to the area.  She noticed that she has a blister now.  The history is provided by the patient.  Illness Severity:  Moderate Onset quality:  Gradual Duration:  2 days Timing:  Constant Associated symptoms: no chest pain, no congestion, no fever, no headaches, no myalgias, no nausea, no rhinorrhea, no shortness of breath, no vomiting and no wheezing        Past Medical History:  Diagnosis Date  . Cancer [facility]) 2020   breast Cancer, 2022 liver mets  . Chronic low back pain with left-sided sciatica   . Family history of leukemia   . Family history of stomach cancer   . Hypertension   . Personal history of chemotherapy   . Personal history of radiation therapy    finished june '21    Patient Active Problem List   Diagnosis Date Noted  . Bone metastases ([place]) 11/26/2020  . Liver metastases ([place]) 11/12/2020  . Wound dehiscence 12/03/2019  . Pleural effusion   . Sinus tachycardia   . Essential hypertension   . Gastroesophageal reflux disease   . SVC syndrome 09/29/2019  . COVID-19 virus infection   . Angioedema 09/28/2019  . Multiple subsegmental pulmonary emboli without acute cor pulmonale ([place]) 09/28/2019  . 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 SVCO (superior vena cava obstruction) 09/28/2019  . Cough   . Sepsis ([place]) 08/29/2019  . Neutropenia with fever ([place]) 08/29/2019  . Hematochezia 08/29/2019  . Anemia 08/29/2019  . Thrombocytopenia ([place]) 08/29/2019  . Port-A-Cath in place 07/24/2019  . Goals of care, counseling/discussion 05/17/2019  . Family history of leukemia   . Family history of stomach cancer   . Malignant neoplasm of upper-outer quadrant of right breast in female, estrogen receptor positive ([place]) 05/07/2019  . S/P laparoscopic assisted vaginal hysterectomy ([facility]) 05/17/2011    Class: Chronic  . FOOT PAIN, RIGHT 07/11/2010    Past Surgical History:  Procedure Laterality Date  . APPLICATION OF WOUND VAC N/A 10/16/2019   Procedure: APPLICATION OF WOUND VAC;  Surgeon: Gaye Pollack, MD;  [facility]: [facility];  Service: Vascular;  Laterality: N/A;  . BREAST BIOPSY Right 05/02/2019   right clips X2  . BREAST LUMPECTOMY Right    06/2019  . BREAST LUMPECTOMY WITH RADIOACTIVE SEED AND SENTINEL LYMPH NODE BIOPSY Right 06/12/2019   Procedure: RIGHT BREAST RADIOACTIVE SEED LUMPECTOMY  AND RIGHT AXILLARY SEED TARGETED LYMPH NODE AND AXILLARY SENTINEL LYMPH NODE MAPPING;  Surgeon: Erroll Luna, MD;  Location: [place];  Service: General;  Laterality: Right;  PEC BLOCK  . C/S x2  '98 '03  . CENTRAL VENOUS CATHETER INSERTION  10/02/2019   Procedure: Insertion Central Line Adult;  Surgeon:  Serafina Mitchell, MD;  Location: [place];  Service: Vascular;;  . CESAREAN SECTION    . CHOLECYSTECTOMY    . I & D EXTREMITY N/A 10/16/2019   Procedure: DEBRIDEMENT of DEHISCED MIDLINE SURGICAL INCISION;  Surgeon: Gaye Pollack, MD;  Location: [facility];  Service: Vascular;  Laterality: N/A;  . IR IMAGING GUIDED PORT INSERTION  11/24/2020  . LAPAROSCOPIC ASSISTED VAGINAL HYSTERECTOMY  05/17/2011   Procedure: LAPAROSCOPIC ASSISTED VAGINAL HYSTERECTOMY;  Surgeon: Sharene Butters;  Location: [facility];  Service: Gynecology;   Laterality: N/A;  Laparoscopic Assisted Vaginal Hysterectomy With Lysis Of Adhesions  . PERICARDIAL WINDOW N/A 10/02/2019   Procedure: Pericardial Window;  Surgeon: Serafina Mitchell, MD;  Location: [place];  Service: Vascular;  Laterality: N/A;  . PORT-A-CATH REMOVAL  10/02/2019   Procedure: Removal Port-A-Cath;  Surgeon: Serafina Mitchell, MD;  Location: [place];  Service: Vascular;;  . PORTACATH PLACEMENT Right 06/12/2019   Procedure: INSERTION PORT-A-CATH WITH ULTRASOUND;  Surgeon: Erroll Luna, MD;  Location: [place];  Service: General;  Laterality: Right;  . RE-EXCISION OF BREAST LUMPECTOMY Right 07/17/2019   Procedure: RE-EXCISION OF RIGHT BREAST LUMPECTOMY;  Surgeon: Erroll Luna, MD;  Location: [place];  Service: General;  Laterality: Right;  . TUBAL LIGATION    . ULTRASOUND GUIDANCE FOR VASCULAR ACCESS  10/02/2019   Procedure: Ultrasound Guidance For Vascular Access;  Surgeon: Serafina Mitchell, MD;  Location: [facility] OR;  Service: Vascular;;  . VENOGRAM N/A 10/02/2019   Procedure: Central VENOGRAM;  Surgeon: Serafina Mitchell, MD;  Location: [facility] OR;  Service: Vascular;  Laterality: N/A;     OB History   No obstetric history on file.     Family History  Problem Relation Age of Onset  . Arthritis Mother   . COPD Mother   . Hypertension Mother   . Hyperlipidemia Mother   . Leukemia Brother 39  . Stomach cancer Maternal Grandmother        late 79s    Social History   Tobacco Use  . Smoking status: Never Smoker  . Smokeless tobacco: Never Used  Vaping Use  . Vaping Use: Never used  Substance Use Topics  . Alcohol use: Not Currently    Alcohol/week: 2.0 standard drinks    Types: 2 Standard drinks or equivalent per week    Comment: "2-3 a week"  . Drug use: No    Home Medications Prior to Admission medications   Medication Sig Start Date End Date Taking? Authorizing Provider  Cholecalciferol (VITAMIN D3) 25 MCG (1000 UT) CHEW Chew 1  tablet by mouth daily.     [provider]  doxycycline (VIBRA-TABS) 100 MG tablet Take 1 tablet (100 mg total) by mouth daily. 12/19/20   Magrinat, Virgie Dad, MD  gabapentin (NEURONTIN) 300 MG capsule Take 1 capsule (300 mg total) by mouth at bedtime as needed. 11/12/20   Magrinat, Virgie Dad, MD  metoprolol tartrate (LOPRESSOR) 25 MG tablet Take 1 tablet (25 mg total) by mouth 2 (two) times daily. 07/23/20 08/22/20  Magrinat, Virgie Dad, MD  ondansetron (ZOFRAN) 8 MG tablet Take 1 tablet (8 mg total) by mouth 2 (two) times daily as needed (Nausea or vomiting). 11/12/20   Magrinat, Virgie Dad, MD  prochlorperazine (COMPAZINE) 10 MG tablet Take 1 tablet (10 mg total) by mouth every 6 (six) hours as needed (Nausea or vomiting). 11/12/20   Magrinat, Virgie Dad, MD  rivaroxaban (XARELTO) 20 MG TABS tablet Take 1  tablet (20 mg total) by mouth daily with supper. 11/26/20   Magrinat, Virgie Dad, MD    Allergies    Patient has no known allergies.  Review of Systems   Review of Systems  Constitutional: Negative for chills and fever.  HENT: Negative for congestion and rhinorrhea.   Eyes: Negative for redness and visual disturbance.  Respiratory: Negative for shortness of breath and wheezing.   Cardiovascular: Negative for chest pain and palpitations.  Gastrointestinal: Negative for nausea and vomiting.  Genitourinary: Negative for dysuria and urgency.  Musculoskeletal: Negative for arthralgias and myalgias.  Skin: Positive for wound. Negative for pallor.  Neurological: Negative for dizziness and headaches.    Physical Exam Updated Vital Signs BP (!) 170/89 (BP Location: Left Arm)   Pulse (!) 116   Temp 98.3 F (36.8 C) (Oral)   Resp 18   Ht 5\' 1"  (1.549 m)   Wt 98.9 kg   SpO2 100%   BMI 41.19 kg/m   Physical Exam Vitals and nursing note reviewed.  Constitutional:      General: She is not in acute distress.    Appearance: She is well-developed. She is not diaphoretic.  HENT:     Head:  Normocephalic and atraumatic.     Mouth/Throat:   Eyes:     Pupils: Pupils are equal, round, and reactive to light.  Cardiovascular:     Rate and Rhythm: Normal rate and regular rhythm.     Heart sounds: No murmur heard. No friction rub. No gallop.   Pulmonary:     Effort: Pulmonary effort is normal.     Breath sounds: No wheezing or rales.  Abdominal:     General: There is no distension.     Palpations: Abdomen is soft.     Tenderness: There is no abdominal tenderness.  Musculoskeletal:        General: No tenderness.     Cervical back: Normal range of motion and neck supple.  Skin:    General: Skin is warm and dry.  Neurological:     Mental Status: She is alert and oriented to person, place, and time.  Psychiatric:        Behavior: Behavior normal.     ED Results / Procedures / Treatments   Labs (all labs ordered are listed, but only abnormal results are displayed) Labs Reviewed - No data to display  EKG None  Radiology No results found.  Procedures Procedures   Medications Ordered in ED Medications - No data to display  ED Course  I have reviewed the triage vital signs and the nursing notes.  Pertinent labs & imaging results that were available during my care of the patient were reviewed by me and considered in my medical decision making (see chart for details).    MDM Rules/Calculators/A&P                          50 yo F with a chief complaints of spontaneous bleeding from her lip.  This occurred just prior to arrival and resolved just prior.  Exam is difficult to ascertain what has exactly transpired.  The patient has a bulge which could be a blister, perhaps blood got trapped underneath the superficial layer of skin.  The other possibilities the patient had a laceration with extrusion of some subcutaneous tissue though I do not appreciate any obvious defect in the area in fact its actually a bit more swollen on the left than  the right.  I did offer to numb  the area and explore it.  After discussing risks and benefits of the bedside the patient is electing to continue to keep an eye on it at home.  We will follow-up with her family doctor in the office.  9:05 PM:  I have discussed the diagnosis/risks/treatment options with the patient and family and believe the pt to be eligible for discharge home to follow-up with PCP. We also discussed returning to the ED immediately if new or worsening sx occur. We discussed the sx which are most concerning (e.g., sudden worsening pain, fever, inability to tolerate by mouth) that necessitate immediate return. Medications administered to the patient during their visit and any new prescriptions provided to the patient are listed below.  Medications given during this visit Medications - No data to display   The patient appears reasonably screen and/or stabilized for discharge and I doubt any other medical condition or other [facility] requiring further screening, evaluation, or treatment in the ED at this time prior to discharge.   Final Clinical Impression(s) / ED Diagnoses Final diagnoses:  Blister of lip    Rx / DC Orders ED Discharge Orders    None       Deno Etienne, DO 02/03/21 2105

## 2021-02-03 NOTE — Discharge Instructions (Signed)
Ice this 15 minutes on 15 minutes off for the next hour.  If it bleeds again then please hold direct pressure for 15 minutes without peaking.  Please follow-up with your family doctor, and oncologist.  If this thing persist it might need to be evaluated by a dermatologist or plastic surgeon.

## 2021-02-03 NOTE — ED Triage Notes (Signed)
Pt arrived via POC for lip blister. Pt states she got sunburn and it was doing fine and then this evening she put hand sanitizer on her hands and is unsure if she touched her lip.

## 2021-02-06 ENCOUNTER — Ambulatory Visit (HOSPITAL_COMMUNITY)
Admission: RE | Admit: 2021-02-06 | Discharge: 2021-02-06 | Disposition: A | Discharge status: Home / Self Care | Payer: 59 | Source: Ambulatory Visit | Attending: Oncology | Admitting: Oncology

## 2021-02-06 ENCOUNTER — Other Ambulatory Visit: Payer: Self-pay

## 2021-02-06 DIAGNOSIS — C50411 Malignant neoplasm of upper-outer quadrant of right female breast: Secondary | ICD-10-CM | POA: Insufficient documentation

## 2021-02-06 DIAGNOSIS — Z170 Estrogen receptor positive status [ER+]: Secondary | ICD-10-CM | POA: Insufficient documentation

## 2021-02-06 IMAGING — MR MR ABDOMEN WO/W CM
18 series · 48 of 48 positions shown · IV contrast (10 GADAVIST)
Comparison: [DATE]

CLINICAL DATA: Breast cancer metastatic to liver, assess treatment
response

EXAM:
MRI ABDOMEN WITHOUT AND WITH CONTRAST
TECHNIQUE: Multiplanar multisequence MR imaging of the abdomen was performed
both before and after the administration of intravenous contrast.
CONTRAST:  10mL GADAVIST GADOBUTROL 1 MMOL/ML IV SOLN

[Series 3: T2 · coronal · 6.0mm · 1.76mm/px · 2 of 35 slices shown (1 of 2)]
[im 1/35]
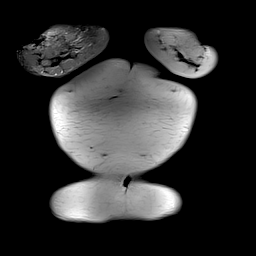
[im 35/35]
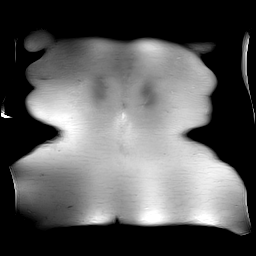

[Series 4: T2 fat-sat · axial · 6.0mm · 1.41mm/px · z∈[-122,+130]mm · 2 of 36 slices shown]
[im 1/36]
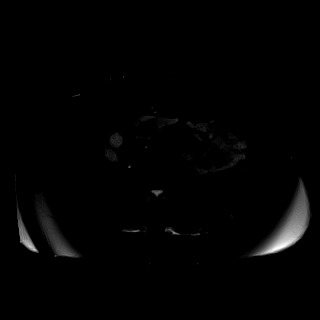
[im 36/36]
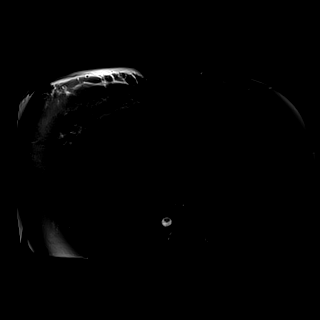

[Series 6: T1 · axial · 3.0mm · 1.41mm/px · z∈[-81,+132]mm · 4 of 72 slices shown (1 of 2)]
[im 1/72]
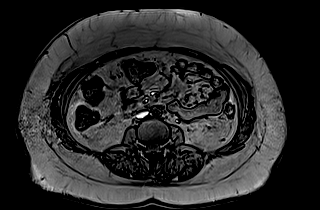
[im 24/72]
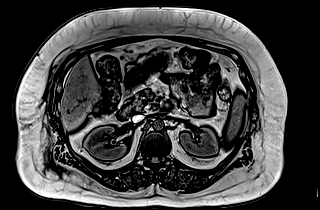
[im 48/72]
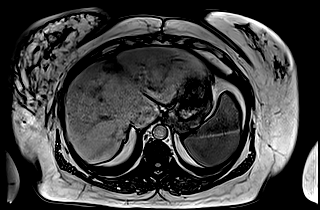
[im 72/72]
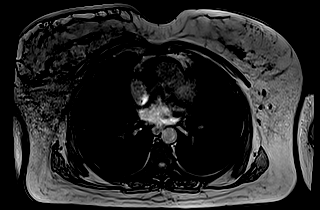

[Series 7: T1 · axial · 3.0mm · 1.41mm/px · z∈[-81,+132]mm · 4 of 72 slices shown (2 of 2)]
[im 1/72]
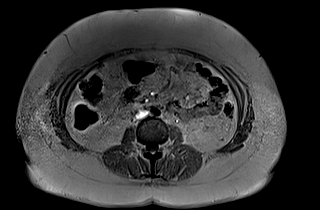
[im 24/72]
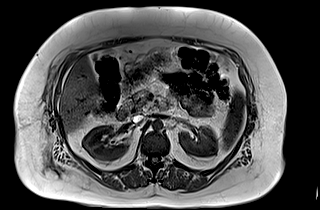
[im 48/72]
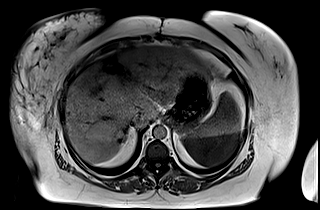
[im 72/72]
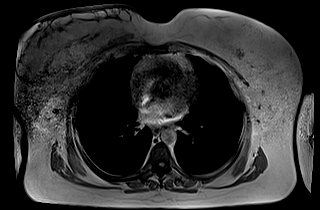

[Series 8: DWI · axial · 6.0mm · 1.68mm/px · z∈[-90,+141]mm · 3 of 66 slices shown (1 of 2)]
[im 1/66]
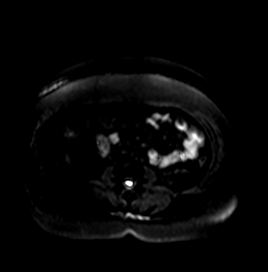
[im 33/66]
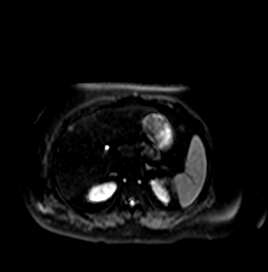
[im 66/66]
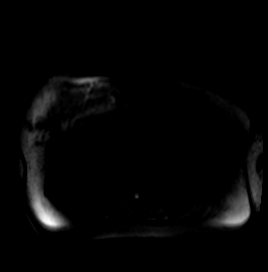

[Series 9: DWI · axial · 6.0mm · 1.68mm/px · 1 of 33 slices shown (2 of 2)]
[im 1/33]
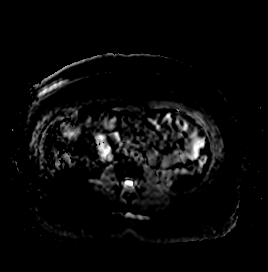

[Series 10: bSSFP · axial · 4.0mm · 0.88mm/px · z∈[-94,+142]mm · 2 of 60 slices shown]
[im 1/60]
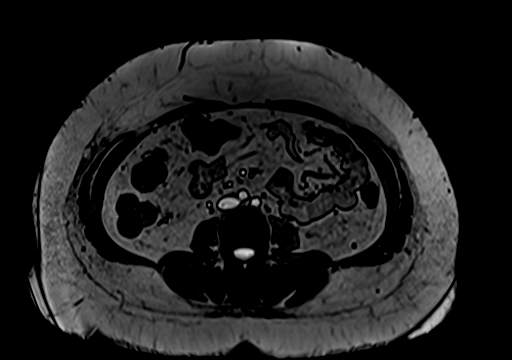
[im 60/60]
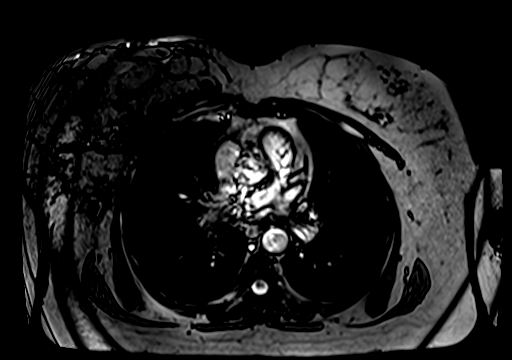

[Series 12: T1 dynamic · axial · 3.0mm · 1.25mm/px · z∈[-95,+142]mm · 3 of 80 slices shown (1 of 10)]
[im 1/80]
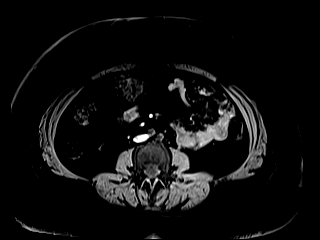
[im 40/80]
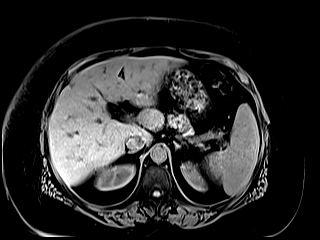
[im 80/80]
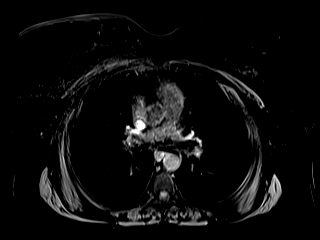

[Series 16: T1 dynamic · axial · 3.0mm · 1.25mm/px · z∈[-95,+142]mm · 3 of 80 slices shown (2 of 10)]
[im 1/80]
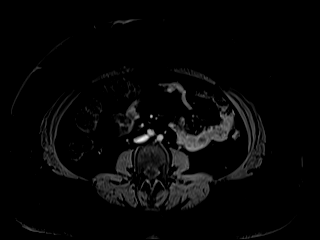
[im 40/80]
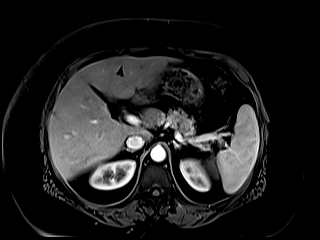
[im 80/80]
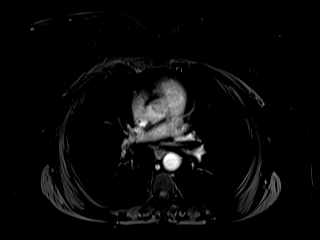

[Series 17: T1 dynamic · axial · 3.0mm · 1.25mm/px · z∈[-95,+142]mm · 3 of 80 slices shown (3 of 10)]
[im 1/80]
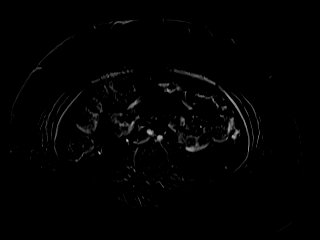
[im 40/80]
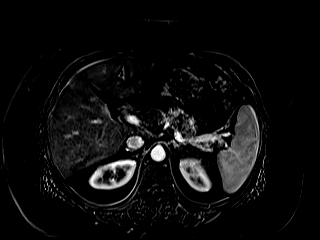
[im 80/80]
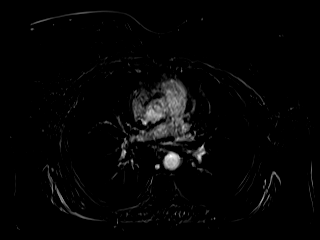

[Series 20: T1 dynamic · axial · 3.0mm · 1.25mm/px · z∈[-95,+142]mm · 3 of 80 slices shown (4 of 10)]
[im 1/80]
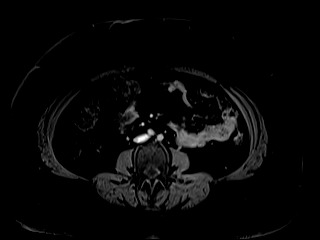
[im 40/80]
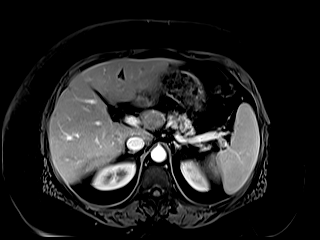
[im 80/80]
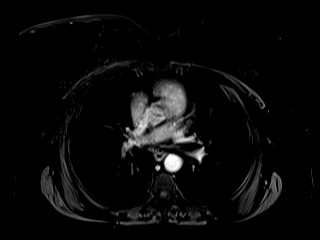

[Series 21: T1 dynamic · axial · 3.0mm · 1.25mm/px · z∈[-95,+142]mm · 3 of 80 slices shown (5 of 10)]
[im 1/80]
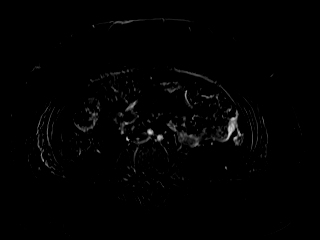
[im 40/80]
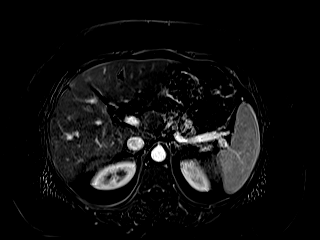
[im 80/80]
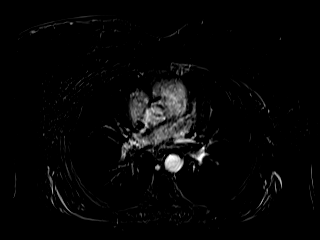

[Series 24: T1 dynamic · axial · 3.0mm · 1.25mm/px · z∈[-95,+142]mm · 3 of 80 slices shown (6 of 10)]
[im 1/80]
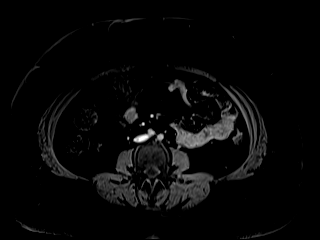
[im 40/80]
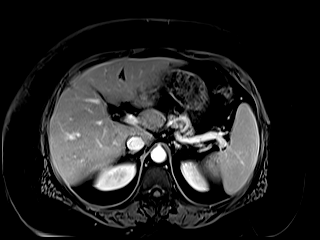
[im 80/80]
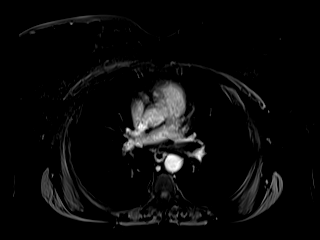

[Series 25: T1 dynamic · axial · 3.0mm · 1.25mm/px · z∈[-95,+142]mm · 3 of 80 slices shown (7 of 10)]
[im 1/80]
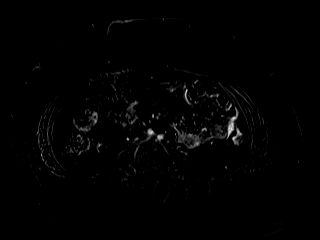
[im 40/80]
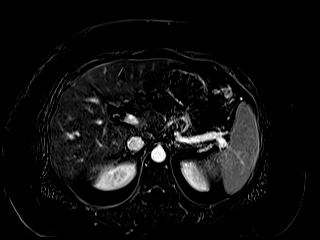
[im 80/80]
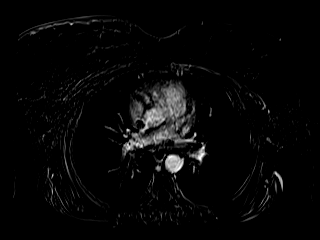

[Series 27: T1 dynamic · coronal · 5.0mm · 1.41mm/px · 2 of 60 slices shown (8 of 10)]
[im 1/60]
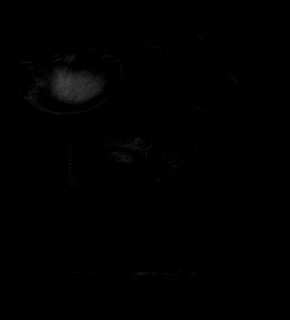
[im 60/60]
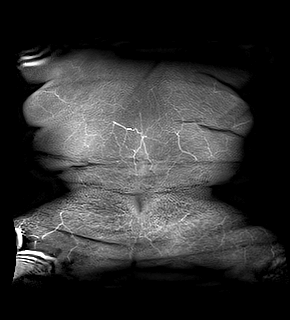

[Series 28: T2 · axial · 6.0mm · 1.76mm/px · 1 of 34 slices shown (2 of 2)]
[im 1/34]
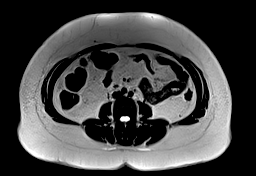

[Series 31: T1 dynamic · axial · 3.0mm · 1.25mm/px · z∈[-95,+142]mm · 3 of 80 slices shown (9 of 10)]
[im 1/80]
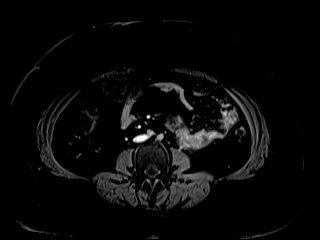
[im 40/80]
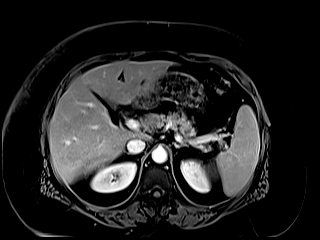
[im 80/80]
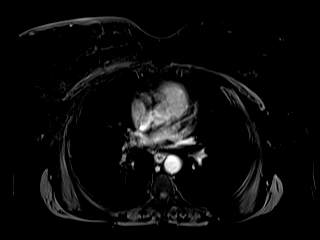

[Series 32: T1 dynamic · axial · 3.0mm · 1.25mm/px · z∈[-95,+142]mm · 3 of 80 slices shown (10 of 10)]
[im 1/80]
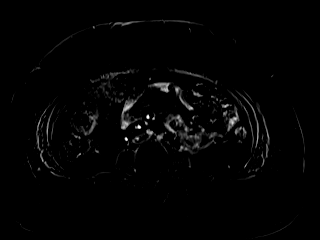
[im 40/80]
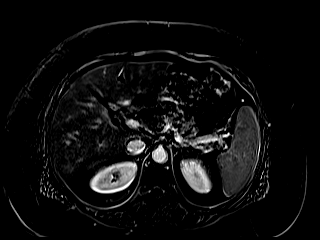
[im 80/80]
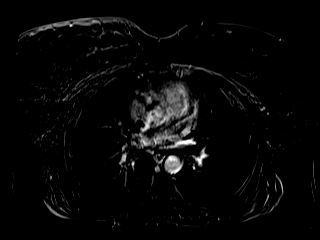

[48 of 48 positions shown; findings below may reference images not displayed]

FINDINGS: Lower chest: No acute findings.

Hepatobiliary: There are multiple bilateral hypoenhancing lesions of
the liver, diminished in size compared to prior examination and
without residual rim hyperenhancement as seen on prior examination.
Largest index lesion of the subcapsular anterior right lobe of the
liver, hepatic segment VIII, measures 2.7 x 2.2 cm, previously 4.2 x
3.4 cm when measured similarly (series 16, image 29). Additional
index lesion of the anterior liver dome, hepatic segment VII,
measures 3.9 x 1.9 cm, previously 4.7 x 2.8 cm when measured
similarly (series 16, image 25). Status post cholecystectomy. No
biliary ductal dilatation.

Pancreas: No mass, inflammatory changes, or other parenchymal
abnormality identified.

Spleen:  Within normal limits in size and appearance.

Adrenals/Urinary Tract: No masses identified. No evidence of
hydronephrosis.

Stomach/Bowel: Visualized portions within the abdomen are
unremarkable.

Vascular/Lymphatic: No pathologically enlarged lymph nodes
identified. No abdominal aortic aneurysm demonstrated.

Other:  None.

Musculoskeletal: No suspicious bone lesions identified.
IMPRESSION: 1. Multiple bilateral hypoenhancing lesions of the liver,
significantly diminished in size compared to prior examination and
without residual rim hyperenhancement. Findings are consistent with
interval treatment response.

2.  Status post cholecystectomy.

## 2021-02-06 MED ORDER — GADOBUTROL 1 MMOL/ML IV SOLN
10.0000 mL | Freq: Once | INTRAVENOUS | Status: AC | PRN
  Administered 2021-02-06: 10 mL via INTRAVENOUS

## 2021-02-09 ENCOUNTER — Encounter: Payer: Self-pay | Admitting: Oncology

## 2021-02-10 NOTE — Progress Notes (Signed)
Veronica [name]  [phone] Fax:([phone]) [phone]     ID: [name] DOB: [birth year]  MR#: [id number]  SHF#:[id number]  Patient Care Team: Jamie Kato as PCP - General (Family Medicine) Mauro Kaufmann, RN as Oncology Nurse Navigator [name], RN as Oncology Nurse Navigator Erroll Luna, MD as Consulting Physician (General Surgery) Alya Smaltz, Virgie Dad, MD as Consulting Physician (Oncology) Kyung Rudd, MD as Consulting Physician (Radiation Oncology) Gaye Pollack, MD as Consulting Physician (Cardiothoracic Surgery) Serafina Mitchell, MD as Consulting Physician (Vascular Surgery) Benedict Needy, MD as Referring Physician (Hematology and Oncology) Chauncey Cruel, MD OTHER MD:  CHIEF COMPLAINT: Estrogen receptor positive breast cancer  CURRENT TREATMENT: Paclitaxel day 1 and day 8 of each 21-day cycle, pembrolizumab given with day 8 of each cycle; zoledronate every 12 weeks   INTERVAL HISTORY: Veronica returns today for follow-up and treatment of her now metastatic breast cancer.  She is accompanied by her husband Heath [name]  Since her last visit, she underwent restaging liver MRI on 02/06/21 showing significant decrease in size of multiple bilateral hypoenhancing liver lesions.  She started paclitaxel, given on days 1 and 8, every 21 days, on 11/20/2020.  She started pembrolizumab on 12/17/2020, and it will be continued every 3 weeks on the day 8 of each cycle.  Today is day 1 cycle 5 of her chemotherapy.  Her last zoledronate was 12/10/2020 and the next dose will be due at the end of May.  We are following her CA 27-29 which showing a very encouraging trend: Lab Results  Component Value Date   CA2729 42.0 (H) 01/28/2021   CA2729 42.8 (H) 01/07/2021   CA2729 45.3 (H) 12/31/2020   CA2729 52.6 (H) 12/10/2020   CA2729 73.9 (H) 11/19/2020   We are following her TSH  Lab Results  Component Value Date   TSH <0.080 (L) 01/28/2021    TSH 2.628 01/07/2021   TSH 1.932 12/31/2020   TSH 1.924 11/19/2020    REVIEW OF SYSTEMS: Arvis has absolutely no peripheral neuropathy symptoms.  She has had some discoloration of the nails.  She feels a little bit more tired usually she notices this when she is trying to go up a hill.  Yesterday she walked a mile and then felt tired after that.  There have been no unusual headaches no visual changes no nausea or vomiting no dizziness no balance issues no falls no cough phlegm production or pleurisy no taste alteration no change in appetite and no change in bowel or bladder habits.  Detailed review of systems was otherwise unremarkable.   COVID 19 VACCINATION STATUS: refuses vaccination, had COVID 29 September 2019   HISTORY OF CURRENT ILLNESS: From the original intake note:  Veronica [name] had routine screening mammography on 04/27/2019 showing a possible abnormality in the right breast. She underwent right diagnostic mammography with tomography and right breast ultrasonography at [facility] on 05/01/2019 showing: breast density category C; highly suspicious 1.7 cm irregular mass in the right breast at 10 o'clock, 8 cm from the nipple; indeterminate hypoechoic round mass in the right axillary tail/low axilla. Physical exam showed no suspicious lumps.  Accordingly on 05/02/2019 she proceeded to biopsy of the right breast area in question. The pathology from this procedure ([id number]) showed: invasive ductal carcinoma, grade 3; ductal carcinoma in situ; lymphovascular invasion present. Prognostic indicators significant for: estrogen receptor, 30% positive with moderate staining intensity, and progesterone receptor, 30% positive with strong staining intensity.  Proliferation marker Ki67 at 20%. HER2 negative by immunohistochemistry (1+).  The second "satellite" lesion was a lymph node which was also positive.  The patient's subsequent history is as detailed below.   PAST MEDICAL HISTORY: Past  Medical History:  Diagnosis Date  . Cancer [facility]) 2020   breast Cancer, 2022 liver mets  . Chronic low back pain with left-sided sciatica   . Family history of leukemia   . Family history of stomach cancer   . Hypertension   . Personal history of chemotherapy   . Personal history of radiation therapy    finished june '21    PAST SURGICAL HISTORY: Past Surgical History:  Procedure Laterality Date  . APPLICATION OF WOUND VAC N/A 10/16/2019   Procedure: APPLICATION OF WOUND VAC;  Surgeon: Gaye Pollack, MD;  Location: [facility];  Service: Vascular;  Laterality: N/A;  . BREAST BIOPSY Right 05/02/2019   right clips X2  . BREAST LUMPECTOMY Right    06/2019  . BREAST LUMPECTOMY WITH RADIOACTIVE SEED AND SENTINEL LYMPH NODE BIOPSY Right 06/12/2019   Procedure: RIGHT BREAST RADIOACTIVE SEED LUMPECTOMY  AND RIGHT AXILLARY SEED TARGETED LYMPH NODE AND AXILLARY SENTINEL LYMPH NODE MAPPING;  Surgeon: Erroll Luna, MD;  Location: [place];  Service: General;  Laterality: Right;  PEC BLOCK  . C/S x2  '98 '03  . CENTRAL VENOUS CATHETER INSERTION  10/02/2019   Procedure: Insertion Central Line Adult;  Surgeon: Serafina Mitchell, MD;  Location: [facility];  Service: Vascular;;  . CESAREAN SECTION    . CHOLECYSTECTOMY    . I & D EXTREMITY N/A 10/16/2019   Procedure: DEBRIDEMENT of DEHISCED MIDLINE SURGICAL INCISION;  Surgeon: Gaye Pollack, MD;  Location: [facility];  Service: Vascular;  Laterality: N/A;  . IR IMAGING GUIDED PORT INSERTION  11/24/2020  . LAPAROSCOPIC ASSISTED VAGINAL HYSTERECTOMY  05/17/2011   Procedure: LAPAROSCOPIC ASSISTED VAGINAL HYSTERECTOMY;  Surgeon: Sharene Butters;  Location: [facility];  Service: Gynecology;  Laterality: N/A;  Laparoscopic Assisted Vaginal Hysterectomy With Lysis Of Adhesions  . PERICARDIAL WINDOW N/A 10/02/2019   Procedure: Pericardial Window;  Surgeon: Serafina Mitchell, MD;  Location: [facility];  Service: Vascular;  Laterality: N/A;  . PORT-A-CATH REMOVAL   10/02/2019   Procedure: Removal Port-A-Cath;  Surgeon: Serafina Mitchell, MD;  Location: [facility];  Service: Vascular;;  . PORTACATH PLACEMENT Right 06/12/2019   Procedure: INSERTION PORT-A-CATH WITH ULTRASOUND;  Surgeon: Erroll Luna, MD;  Location: [facility];  Service: General;  Laterality: Right;  . RE-EXCISION OF BREAST LUMPECTOMY Right 07/17/2019   Procedure: RE-EXCISION OF RIGHT BREAST LUMPECTOMY;  Surgeon: Erroll Luna, MD;  Location: [facility];  Service: General;  Laterality: Right;  . TUBAL LIGATION    . ULTRASOUND GUIDANCE FOR VASCULAR ACCESS  10/02/2019   Procedure: Ultrasound Guidance For Vascular Access;  Surgeon: Serafina Mitchell, MD;  Location: [facility];  Service: Vascular;;  . VENOGRAM N/A 10/02/2019   Procedure: Central VENOGRAM;  Surgeon: Serafina Mitchell, MD;  Location[facility];  Service: Vascular;  Laterality: N/A;    FAMILY HISTORY: Family History  Problem Relation Age of Onset  . Arthritis Mother   . COPD Mother   . Hypertension Mother   . Hyperlipidemia Mother   . Leukemia Brother 32  . Stomach cancer Maternal Grandmother        late 66s  Patient's parents are living (as of September 2020). Her father is 6 and her mother is 5  as of 04/2019. The patient denies a family hx of breast or ovarian cancer. She has 7 siblings, 6 brothers and 1 sister. She reports her maternal grandmother was diagnosed with stomach cancer at age 81. Her brother was diagnosed with leukemia at age 37.   GYNECOLOGIC HISTORY:  No LMP recorded. Patient has had a hysterectomy. Menarche: 50 years old Age at first live birth: 50 years old GX P 2 (+1 stillborn) LMP age 62-42 Contraceptive: unsure, maybe for a year at age 77. HRT no Hysterectomy? Yes, 05/2011 BSO? 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 She is married. Husband Sheppard Plumber") is a Hydrologist for a telephone company. She lives at home with her  husband and two daughters. Daughter Suszanne Conners, age 61, is a Scientist, water quality. Daughter Jarrett Soho, age 78, is a Ship broker.     ADVANCED DIRECTIVES: Husband Heath [name] is her HCPOA.   HEALTH MAINTENANCE: Social History   Tobacco Use  . Smoking status: Never Smoker  . Smokeless tobacco: Never Used  Vaping Use  . Vaping Use: Never used  Substance Use Topics  . Alcohol use: Not Currently    Alcohol/week: 2.0 standard drinks    Types: 2 Standard drinks or equivalent per week    Comment: "2-3 a week"  . Drug use: No     Colonoscopy: n/a  PAP: 02/2018  Bone density: n/a   No Known Allergies  Current Outpatient Medications  Medication Sig Dispense Refill  . Cholecalciferol (VITAMIN D3) 25 MCG (1000 UT) CHEW Chew 1 tablet by mouth daily.     Marland Kitchen doxycycline (VIBRA-TABS) 100 MG tablet Take 1 tablet (100 mg total) by mouth daily. 30 tablet 4  . gabapentin (NEURONTIN) 300 MG capsule Take 1 capsule (300 mg total) by mouth at bedtime as needed. 90 capsule 4  . metoprolol tartrate (LOPRESSOR) 25 MG tablet Take 1 tablet (25 mg total) by mouth 2 (two) times daily. 60 tablet 0  . ondansetron (ZOFRAN) 8 MG tablet Take 1 tablet (8 mg total) by mouth 2 (two) times daily as needed (Nausea or vomiting). 30 tablet 1  . prochlorperazine (COMPAZINE) 10 MG tablet Take 1 tablet (10 mg total) by mouth every 6 (six) hours as needed (Nausea or vomiting). 90 tablet 1  . rivaroxaban (XARELTO) 20 MG TABS tablet Take 1 tablet (20 mg total) by mouth daily with supper. 90 tablet 3   No current facility-administered medications for this visit.    OBJECTIVE: white woman who appears stated age  89:   02/11/21 0808  BP: 116/62  Pulse: 94  Resp: 17  Temp: 97.7 F (36.5 C)  SpO2: 100%   Wt Readings from Last 3 Encounters:  02/11/21 225 lb 6.4 oz (102.2 kg)  02/03/21 218 lb (98.9 kg)  01/21/21 225 lb (102.1 kg)   Body mass index is 42.59 kg/m.    ECOG FS:1 - Symptomatic but completely ambulatory  Sclerae unicteric,  EOMs intact Wearing a mask No cervical or supraclavicular adenopathy Lungs no rales or rhonchi Heart regular rate and rhythm Abd soft, nontender, positive bowel sounds MSK no focal spinal tenderness, no upper extremity lymphedema Neuro: nonfocal, well oriented, appropriate affect Breasts: The right breast is status post lumpectomy and radiation.  There are no palpable masses.  There is mild skin coarsening secondary to the radiation.  There is no evidence of local recurrence.  The cosmetic result is good.  The left breast and both axillae are benign.   LAB  RESULTS:  CMP     Component Value Date/Time   NA 140 01/28/2021 0808   K 4.1 01/28/2021 0808   CL 108 01/28/2021 0808   CO2 24 01/28/2021 0808   GLUCOSE 178 (H) 01/28/2021 0808   BUN 15 01/28/2021 0808   CREATININE 0.69 01/28/2021 0808   CREATININE 0.75 04/24/2020 1035   CALCIUM 9.2 01/28/2021 0808   PROT 6.3 (L) 01/28/2021 0808   ALBUMIN 3.5 01/28/2021 0808   AST 22 01/28/2021 0808   AST 23 04/24/2020 1035   ALT 24 01/28/2021 0808   ALT 20 04/24/2020 1035   ALKPHOS 88 01/28/2021 0808   BILITOT 0.8 01/28/2021 0808   BILITOT 1.1 04/24/2020 1035   GFRNONAA >60 01/28/2021 0808   GFRNONAA >60 04/24/2020 1035   GFRAA >60 04/24/2020 1035    No results found for: TOTALPROTELP, ALBUMINELP, A1GS, A2GS, BETS, BETA2SER, GAMS, MSPIKE, SPEI  No results found for: KPAFRELGTCHN, LAMBDASER, KAPLAMBRATIO  Lab Results  Component Value Date   WBC 4.0 01/28/2021   NEUTROABS 2.8 01/28/2021   HGB 10.9 (L) 01/28/2021   HCT 31.9 (L) 01/28/2021   MCV 86.7 01/28/2021   PLT 227 01/28/2021   No results found for: LABCA2  No components found for: IWPYKD983  No results for input(s): INR in the last 168 hours.  No results found for: LABCA2  No results found for: CAN199  No results found for: JAS505  No results found for: LZJ673  Lab Results  Component Value Date   CA2729 42.0 (H) 01/28/2021    No components found for:  HGQUANT  Lab Results  Component Value Date   CEA1 2.45 10/29/2020   /  CEA ([facility])  Date Value Ref Range Status  10/29/2020 2.45 0.00 - 5.00 ng/mL Final    Comment:    (NOTE) This test was performed using Architect's Chemiluminescent Microparticle Immunoassay. Values obtained from different assay methods cannot be used interchangeably. Please note that 5-10% of patients who smoke may see CEA levels up to 6.9 ng/mL. Performed at [facility], [place] [address] [place], [place] [address]      No results found for: AFPTUMOR  No results found for: [place]  No results found for: HGBA, HGBA2QUANT, HGBFQUANT, HGBSQUAN (Hemoglobinopathy evaluation)   Lab Results  Component Value Date   LDH 170 09/30/2019    No results found for: IRON, TIBC, IRONPCTSAT (Iron and TIBC)  Lab Results  Component Value Date   FERRITIN 498 (H) 10/19/2019    Urinalysis    Component Value Date/Time   COLORURINE YELLOW 08/28/2019 2326   APPEARANCEUR CLOUDY (A) 08/28/2019 2326   LABSPEC 1.013 08/28/2019 2326   PHURINE 5.0 08/28/2019 2326   GLUCOSEU NEGATIVE 08/28/2019 2326   HGBUR MODERATE (A) 08/28/2019 2326   BILIRUBINUR NEGATIVE 08/28/2019 2326   KETONESUR NEGATIVE 08/28/2019 2326   PROTEINUR NEGATIVE 08/28/2019 2326   NITRITE NEGATIVE 08/28/2019 2326   LEUKOCYTESUR MODERATE (A) 08/28/2019 2326    STUDIES: MR LIVER W WO CONTRAST  Result Date: 02/08/2021 CLINICAL DATA:  Breast cancer metastatic to liver, assess treatment response EXAM: MRI ABDOMEN WITHOUT AND WITH CONTRAST TECHNIQUE: Multiplanar multisequence MR imaging of the abdomen was performed both before and after the administration of intravenous contrast. CONTRAST:  79mL GADAVIST GADOBUTROL 1 MMOL/ML IV SOLN COMPARISON:  10/22/2020 FINDINGS: Lower chest: No acute findings. Hepatobiliary: There are multiple bilateral hypoenhancing lesions of the liver, diminished in size compared to prior examination  and without residual rim hyperenhancement as seen on  prior examination. Largest index lesion of the subcapsular anterior right lobe of the liver, hepatic segment VIII, measures 2.7 x 2.2 cm, previously 4.2 x 3.4 cm when measured similarly (series 16, image 29). Additional index lesion of the anterior liver dome, hepatic segment VII, measures 3.9 x 1.9 cm, previously 4.7 x 2.8 cm when measured similarly (series 16, image 25). Status post cholecystectomy. No biliary ductal dilatation. Pancreas: No mass, inflammatory changes, or other parenchymal abnormality identified. Spleen:  Within normal limits in size and appearance. Adrenals/Urinary Tract: No masses identified. No evidence of hydronephrosis. Stomach/Bowel: Visualized portions within the abdomen are unremarkable. Vascular/Lymphatic: No pathologically enlarged lymph nodes identified. No abdominal aortic aneurysm demonstrated. Other:  None. Musculoskeletal: No suspicious bone lesions identified. IMPRESSION: 1. Multiple bilateral hypoenhancing lesions of the liver, significantly diminished in size compared to prior examination and without residual rim hyperenhancement. Findings are consistent with interval treatment response. 2.  Status post cholecystectomy. Electronically Signed   By: Eddie Candle M.D.   On: 02/08/2021 19:35     ELIGIBLE FOR AVAILABLE RESEARCH PROTOCOL: AET  ASSESSMENT: 50 y.o. [place], [place] woman status post right breast upper outer quadrant biopsy 05/01/2019 for a clinical T1c N1, stage IIA invasive ductal carcinoma, grade 3, estrogen and progesterone receptor positive, HER-2 nonamplified, with an MIB-1-1 of 20%.  (a) mass in the axillary tail was a positive lymph node  (1) MammaPrint obtained from the original biopsy shows a high risk luminal B tumor and predicts a 5-year disease-free survival of 91% in her case  (2) genetics testing 05/09/2019 through the Common Hereditary Gene Panel offered by [facility] found no deleterious mutations  in APC, ATM, AXIN2, BARD1, BMPR1A, BRCA1, BRCA2, BRIP1, CDH1, CDK4, CDKN2A (p14ARF), CDKN2A (p16INK4a), CHEK2, CTNNA1, DICER1, EPCAM (Deletion/duplication testing only), GREM1 (promoter region deletion/duplication testing only), KIT, MEN1, MLH1, MSH2, MSH3, MSH6, MUTYH, NBN, NF1, NHTL1, PALB2, PDGFRA, PMS2, POLD1, POLE, PTEN, RAD50, RAD51C, RAD51D, RNF43, SDHB, SDHC, SDHD, SMAD4, SMARCA4. STK11, TP53, TSC1, TSC2, and VHL.  The following genes were evaluated for sequence changes only: SDHA and HOXB13 c.251G>A variant only.   (a) A variant of uncertain significance (VUS) was detected in one of her MSH6 genes (c.831A>C).  (3) status post right lumpectomy and sentinel lymph node sampling 06/12/2019 for a pT2 pN1, stage IIA invasive ductal carcinoma, grade 2, with positive margins  (a) a total of 4 sentinel lymph nodes removed, one positive (with ECE), ine itc  (b) margin clearance 04/19/2019 successful (medial margin close but negative for DCIS)  (4) adjuvant chemotherapy consisting of doxorubicin and cyclophosphamide in dose dense fashion x4 starting 07/10/2019, completed 09/24/2019; planned weekly paclitaxel x12 omitted   (a) echo 06/26/2019 shows an EF of 60-65%  (b echo on 09/19/2019 shows an EF of 60-65%  (c) chemotherapy stopped after four cycles of doxorubicin and cyclophosphamide due to #5 below  (5) Multiple VTE/PE documented 10/02/2019, on intravenous heparin initially, then Xarelto started 10/19/2019  (a) presented with SVC syndrome and bilateral pulmonary emboli on 09/28/2019  (b) s/p SVC thrombectomy and port removal 86/57/8469 complicated by hemopericardium and tamponade, necessitating pericardial window placement   (c) postop course complicated by wound dehiscence requiring wound VAC  (c) Doppler 10/03/2019 showed right lower extremity DVT  (d) CT angio and Doppler of both upper extremities 10/13/2019 found persistent acute bilateral pulmonary emboli, persistent but improved SVC thrombus,  bilateral pleural effusions, and right lower lobe collapse. Bilateral upper extremity DVTs also noted  (e) Dopplers upper extremity 11/19/2019 showed clots cleared on the  left, chronic DVT right internal jugular and axillary veins  (f) CT angio of the chest 10/07/2020 shows resolution of earlier pleural effusions and no evidence of active embolism (but see #9 below)  (6) COVID-19 infection documented 09/27/2020, status post remdesivir  (7) adjuvant radiation 12/24/19 - 02/06/20 Site/dose:   The patient initially received a dose of 50.4 Gy in 28 fractions to the breast and SCLV region using a 4-field approach. This was delivered using a 3-D conformal technique. The patient then received a boost to the seroma. This delivered an additional 10 Gy in 5 fractions using a 3-field photon boost technique. The total dose was 60.4 Gy.  (8) anastrozole started mid May 2021  (a) [facility] and estradiol obtained 11/15/2019 consistent with menopause  (b) not a good candidate for tamoxifen given history of DVT/PE above  (c) bone density 07/31/2020 normal (T score equals 0.1).  METASTATIC DISEASE: Jan 2022 (9) CT angio of the chest 12/28/2021shows new liver lesions (not seen on CT angio January 2021)  (a) MRI 10/22/2020 shows multiple liver lesions, the largest 2.5 cm  (b)   liver biopsy 11/03/2020 confirms metastatic carcinoma estrogen receptor 10% positive with weak staining intensity, HER-2 and progesterone receptor negative.  (c) bone scan 11/25/2020 shows left femoral lesion as well as other lesions   (i) left hip films not suggestive of impending pathologic fracture  (d) non-contrast head CT 11/25/2020 negative except for sinusitis  (e) CA 27-29 is informative (was 104.1 on 10/29/2020)  (10) foundation 1 study requested on 11/03/2020 liver biopsy dated 11/03/2020 shows PI K3 CA amplification, and a T p53 mutation.  There were no mutations in BRCA1 and BRCA2 or HER-2.  There is amplification of PRK CI, S0X2, T  ERC, and FGF 1 2.  The microsatellite status is stable.  The tumor mutational burden is 4 mutations per Mb  (11) started paclitaxel 11/19/2018 2012, repeated day 1 day 8 of every 21-day cycle  (a) pembrolizumab added 12/17/2020, repeated every 21 days  (b) MRI liver 01/21/2021 (after 3 cycles) shows significant response  (12) zoledronate added 12/10/2020, repeated every 12 weeks   PLAN:  Tarica has completed 4 cycles of paclitaxel, with pembrolizumab and zoledronate.  She is having a good response with about a 30% shrinkage of her measurable disease in the liver.  This is very favorable.  Importantly she has had no peripheral neuropathy.  The main symptom she is experiencing is some fatigue.  This does not keep her for walking up to a mile at a time.  We could hold here and start observation (which would be continuing pembrolizumab and zoledronate only) but my recommendation is that we try to get through 4 more cycles and then repeat a liver MRI and then begin observation.  She is very much in favor of this.  I have entered the appropriate orders and the scheduling requests.  I am going to see her on day one of the sixth seventh and eighth cycles and we will monitor tolerance at that time assuming all goes well she would have a repeat liver MRI after cycle #8 and at that point we would start monitoring  I have encouraged her to continue to be as active as she can be and to let us know if she develops any peripheral neuropathy symptoms at any time  Total encounter time 35 minutes.Sarajane Jews C. Waukau, MD Medical Oncology and Hematology [facility] [place], [place] [address] Tel. [phone]  Fax. [phone]   I, Wilburn Mylar, am acting as scribe for Dr. Virgie Dad. Varie Machamer.  I, Lurline Del MD, have reviewed the above documentation for accuracy and completeness, and I agree with the above.   *Total Encounter Time as defined by the Centers for  Medicare and Medicaid Services includes, in addition to the face-to-face time of a patient visit (documented in the note above) non-face-to-face time: obtaining and reviewing outside history, ordering and reviewing medications, tests or procedures, care coordination (communications with other health care professionals or caregivers) and documentation in the medical record.

## 2021-02-11 ENCOUNTER — Inpatient Hospital Stay: Payer: 59

## 2021-02-11 ENCOUNTER — Other Ambulatory Visit: Payer: Self-pay

## 2021-02-11 ENCOUNTER — Inpatient Hospital Stay: Payer: 59 | Attending: Oncology

## 2021-02-11 ENCOUNTER — Inpatient Hospital Stay (HOSPITAL_BASED_OUTPATIENT_CLINIC_OR_DEPARTMENT_OTHER): Payer: 59 | Admitting: Oncology

## 2021-02-11 VITALS — BP 116/62 | HR 94 | Temp 97.7°F | Resp 17 | Ht 61.0 in | Wt 225.4 lb

## 2021-02-11 DIAGNOSIS — C787 Secondary malignant neoplasm of liver and intrahepatic bile duct: Secondary | ICD-10-CM | POA: Diagnosis not present

## 2021-02-11 DIAGNOSIS — C50411 Malignant neoplasm of upper-outer quadrant of right female breast: Secondary | ICD-10-CM | POA: Insufficient documentation

## 2021-02-11 DIAGNOSIS — Z170 Estrogen receptor positive status [ER+]: Secondary | ICD-10-CM

## 2021-02-11 DIAGNOSIS — Z79899 Other long term (current) drug therapy: Secondary | ICD-10-CM | POA: Insufficient documentation

## 2021-02-11 DIAGNOSIS — C773 Secondary and unspecified malignant neoplasm of axilla and upper limb lymph nodes: Secondary | ICD-10-CM | POA: Diagnosis not present

## 2021-02-11 DIAGNOSIS — Z5111 Encounter for antineoplastic chemotherapy: Secondary | ICD-10-CM | POA: Diagnosis present

## 2021-02-11 DIAGNOSIS — Z5112 Encounter for antineoplastic immunotherapy: Secondary | ICD-10-CM | POA: Diagnosis present

## 2021-02-11 DIAGNOSIS — C7951 Secondary malignant neoplasm of bone: Secondary | ICD-10-CM | POA: Insufficient documentation

## 2021-02-11 LAB — COMPREHENSIVE METABOLIC PANEL
ALT: 28 U/L (ref 0–44)
AST: 25 U/L (ref 15–41)
Albumin: 3.3 g/dL — ABNORMAL LOW (ref 3.5–5.0)
Alkaline Phosphatase: 77 U/L (ref 38–126)
Anion gap: 7 (ref 5–15)
BUN: 14 mg/dL (ref 6–20)
CO2: 24 mmol/L (ref 22–32)
Calcium: 9.1 mg/dL (ref 8.9–10.3)
Chloride: 108 mmol/L (ref 98–111)
Creatinine, Ser: 0.65 mg/dL (ref 0.44–1.00)
GFR, Estimated: >60 mL/min (ref >60)
Glucose, Bld: 188 mg/dL — ABNORMAL HIGH (ref 70–99)
Potassium: 3.9 mmol/L (ref 3.5–5.1)
Sodium: 139 mmol/L (ref 135–145)
Total Bilirubin: 0.8 mg/dL (ref 0.3–1.2)
Total Protein: 5.8 g/dL — ABNORMAL LOW (ref 6.5–8.1)

## 2021-02-11 LAB — CBC WITH DIFFERENTIAL/PLATELET
Abs Immature Granulocytes: 0.02 10*3/uL (ref 0.00–0.07)
Basophils Absolute: 0 10*3/uL (ref 0.0–0.1)
Basophils Relative: 1 %
Eosinophils Absolute: 0 10*3/uL (ref 0.0–0.5)
Eosinophils Relative: 1 %
HCT: 32.4 % — ABNORMAL LOW (ref 36.0–46.0)
Hemoglobin: 10.7 g/dL — ABNORMAL LOW (ref 12.0–15.0)
Immature Granulocytes: 1 %
Lymphocytes Relative: 19 %
Lymphs Abs: 0.6 10*3/uL — ABNORMAL LOW (ref 0.7–4.0)
MCH: 28.6 pg (ref 26.0–34.0)
MCHC: 33 g/dL (ref 30.0–36.0)
MCV: 86.6 fL (ref 80.0–100.0)
Monocytes Absolute: 0.7 10*3/uL (ref 0.1–1.0)
Monocytes Relative: 25 %
Neutro Abs: 1.6 10*3/uL — ABNORMAL LOW (ref 1.7–7.7)
Neutrophils Relative %: 53 %
Platelets: 195 10*3/uL (ref 150–400)
RBC: 3.74 MIL/uL — ABNORMAL LOW (ref 3.87–5.11)
RDW: 14.3 % (ref 11.5–15.5)
WBC: 2.9 10*3/uL — ABNORMAL LOW (ref 4.0–10.5)
nRBC: 0 % (ref 0.0–0.2)

## 2021-02-11 MED ORDER — DEXAMETHASONE SODIUM PHOSPHATE 10 MG/ML IJ SOLN
4.0000 mg | Freq: Once | INTRAMUSCULAR | Status: AC
  Administered 2021-02-11: 4 mg via INTRAVENOUS

## 2021-02-11 MED ORDER — HEPARIN SOD (PORK) LOCK FLUSH 100 UNIT/ML IV SOLN
500.0000 [IU] | Freq: Once | INTRAVENOUS | Status: AC | PRN
  Administered 2021-02-11: 500 [IU]
  Filled 2021-02-11: qty 5

## 2021-02-11 MED ORDER — FAMOTIDINE 20 MG IN NS 100 ML IVPB: 20.0000 mg | Freq: Two times a day (BID) | INTRAVENOUS | Status: DC

## 2021-02-11 MED ORDER — DIPHENHYDRAMINE HCL 50 MG/ML IJ SOLN
25.0000 mg | Freq: Once | INTRAMUSCULAR | Status: AC
  Administered 2021-02-11: 25 mg via INTRAVENOUS

## 2021-02-11 MED ORDER — FAMOTIDINE 20 MG IN NS 100 ML IVPB
INTRAVENOUS | Status: AC
  Filled 2021-02-11: qty 100

## 2021-02-11 MED ORDER — DIPHENHYDRAMINE HCL 50 MG/ML IJ SOLN
INTRAMUSCULAR | Status: AC
  Filled 2021-02-11: qty 1

## 2021-02-11 MED ORDER — DEXAMETHASONE SODIUM PHOSPHATE 10 MG/ML IJ SOLN
INTRAMUSCULAR | Status: AC
  Filled 2021-02-11: qty 1

## 2021-02-11 MED ORDER — SODIUM CHLORIDE 0.9 % IV SOLN
90.0000 mg/m2 | Freq: Once | INTRAVENOUS | Status: AC
  Administered 2021-02-11: 186 mg via INTRAVENOUS
  Filled 2021-02-11: qty 31

## 2021-02-11 MED ORDER — FAMOTIDINE 20 MG IN NS 100 ML IVPB
20.0000 mg | Freq: Once | INTRAVENOUS | Status: AC
  Administered 2021-02-11: 20 mg via INTRAVENOUS

## 2021-02-11 MED ORDER — SODIUM CHLORIDE 0.9% FLUSH
10.0000 mL | INTRAVENOUS | Status: DC | PRN
  Administered 2021-02-11: 10 mL
  Filled 2021-02-11: qty 10

## 2021-02-11 MED ORDER — SODIUM CHLORIDE 0.9 % IV SOLN
Freq: Once | INTRAVENOUS | Status: AC
  Filled 2021-02-11: qty 250

## 2021-02-11 NOTE — Patient Instructions (Signed)

## 2021-02-11 NOTE — Patient Instructions (Signed)
[facility]   Discharge Instructions: Thank you for choosing [facility] to provide your oncology and hematology care.   If you have a lab appointment with the Cancer Center, please go directly to the Cancer Center and check in at the registration area.   Wear comfortable clothing and clothing appropriate for easy access to any Portacath or PICC line.   We strive to give you quality time with your provider. You may need to reschedule your appointment if you arrive late (15 or more minutes).  Arriving late affects you and other patients whose appointments are after yours.  Also, if you miss three or more appointments without notifying the office, you may be dismissed from the clinic at the provider's discretion.      For prescription refill requests, have your pharmacy contact our office and allow 72 hours for refills to be completed.    Today you received the following chemotherapy and/or immunotherapy agents: paclitaxel.      To help prevent nausea and vomiting after your treatment, we encourage you to take your nausea medication as directed.  BELOW ARE SYMPTOMS THAT SHOULD BE REPORTED IMMEDIATELY: *FEVER GREATER THAN 100.4 F (38 C) OR HIGHER *CHILLS OR SWEATING *NAUSEA AND VOMITING THAT IS NOT CONTROLLED WITH YOUR NAUSEA MEDICATION *UNUSUAL SHORTNESS OF BREATH *UNUSUAL BRUISING OR BLEEDING *URINARY PROBLEMS (pain or burning when urinating, or frequent urination) *BOWEL PROBLEMS (unusual diarrhea, constipation, pain near the anus) TENDERNESS IN MOUTH AND THROAT WITH OR WITHOUT PRESENCE OF ULCERS (sore throat, sores in mouth, or a toothache) UNUSUAL RASH, SWELLING OR PAIN  UNUSUAL VAGINAL DISCHARGE OR ITCHING   Items with * indicate a potential emergency and should be followed up as soon as possible or go to the Emergency Department if any problems should occur.  Please show the CHEMOTHERAPY ALERT CARD or IMMUNOTHERAPY ALERT CARD at check-in  to the Emergency Department and triage nurse.  Should you have questions after your visit or need to cancel or reschedule your appointment, please contact [facility]  Dept: [phone]  and follow the prompts.  Office hours are 8:00 a.m. to 4:30 p.m. Monday - Friday. Please note that voicemails left after 4:00 p.m. may not be returned until the following business day.  We are closed weekends and major holidays. You have access to a nurse at all times for urgent questions. Please call the main number to the clinic Dept: [phone] and follow the prompts.   For any non-urgent questions, you may also contact your provider using MyChart. We now offer e-Visits for anyone 18 and older to request care online for non-urgent symptoms. For details visit [email or web address].   Also download the MyChart app! Go to the app store, search "MyChart", open the app, select [facility], and log in with your MyChart username and password.  Due to Covid, a mask is required upon entering the hospital/clinic. If you do not have a mask, one will be given to you upon arrival. For doctor visits, patients may have 1 support person aged 18 or older with them. For treatment visits, patients cannot have anyone with them due to current Covid guidelines and our immunocompromised population.   

## 2021-02-17 ENCOUNTER — Telehealth: Payer: Self-pay | Admitting: Oncology

## 2021-02-17 NOTE — Telephone Encounter (Signed)
Scheduled per 5/4 los. Pt will receive an updated appt calendar per next visit

## 2021-02-18 ENCOUNTER — Other Ambulatory Visit: Payer: Self-pay

## 2021-02-18 ENCOUNTER — Inpatient Hospital Stay: Payer: 59

## 2021-02-18 VITALS — BP 113/71 | HR 77 | Temp 98.1°F | Resp 18 | Wt 224.1 lb

## 2021-02-18 DIAGNOSIS — C50411 Malignant neoplasm of upper-outer quadrant of right female breast: Secondary | ICD-10-CM

## 2021-02-18 DIAGNOSIS — Z5112 Encounter for antineoplastic immunotherapy: Secondary | ICD-10-CM | POA: Diagnosis not present

## 2021-02-18 DIAGNOSIS — C787 Secondary malignant neoplasm of liver and intrahepatic bile duct: Secondary | ICD-10-CM

## 2021-02-18 DIAGNOSIS — Z170 Estrogen receptor positive status [ER+]: Secondary | ICD-10-CM

## 2021-02-18 DIAGNOSIS — Z7189 Other specified counseling: Secondary | ICD-10-CM

## 2021-02-18 DIAGNOSIS — Z95828 Presence of other vascular implants and grafts: Secondary | ICD-10-CM

## 2021-02-18 LAB — CBC WITH DIFFERENTIAL/PLATELET
Abs Immature Granulocytes: 0.1 10*3/uL — ABNORMAL HIGH (ref 0.00–0.07)
Basophils Absolute: 0 10*3/uL (ref 0.0–0.1)
Basophils Relative: 1 %
Eosinophils Absolute: 0.1 10*3/uL (ref 0.0–0.5)
Eosinophils Relative: 1 %
HCT: 33.8 % — ABNORMAL LOW (ref 36.0–46.0)
Hemoglobin: 11.3 g/dL — ABNORMAL LOW (ref 12.0–15.0)
Immature Granulocytes: 2 %
Lymphocytes Relative: 17 %
Lymphs Abs: 0.8 10*3/uL (ref 0.7–4.0)
MCH: 28.3 pg (ref 26.0–34.0)
MCHC: 33.4 g/dL (ref 30.0–36.0)
MCV: 84.5 fL (ref 80.0–100.0)
Monocytes Absolute: 0.2 10*3/uL (ref 0.1–1.0)
Monocytes Relative: 5 %
Neutro Abs: 3.3 10*3/uL (ref 1.7–7.7)
Neutrophils Relative %: 74 %
Platelets: 224 10*3/uL (ref 150–400)
RBC: 4 MIL/uL (ref 3.87–5.11)
RDW: 13.2 % (ref 11.5–15.5)
WBC: 4.4 10*3/uL (ref 4.0–10.5)
nRBC: 0 % (ref 0.0–0.2)

## 2021-02-18 LAB — COMPREHENSIVE METABOLIC PANEL
ALT: 26 U/L (ref 0–44)
AST: 21 U/L (ref 15–41)
Albumin: 3.4 g/dL — ABNORMAL LOW (ref 3.5–5.0)
Alkaline Phosphatase: 72 U/L (ref 38–126)
Anion gap: 9 (ref 5–15)
BUN: 14 mg/dL (ref 6–20)
CO2: 23 mmol/L (ref 22–32)
Calcium: 9.6 mg/dL (ref 8.9–10.3)
Chloride: 106 mmol/L (ref 98–111)
Creatinine, Ser: 0.71 mg/dL (ref 0.44–1.00)
GFR, Estimated: >60 mL/min (ref >60)
Glucose, Bld: 219 mg/dL — ABNORMAL HIGH (ref 70–99)
Potassium: 4 mmol/L (ref 3.5–5.1)
Sodium: 138 mmol/L (ref 135–145)
Total Bilirubin: 0.9 mg/dL (ref 0.3–1.2)
Total Protein: 6.1 g/dL — ABNORMAL LOW (ref 6.5–8.1)

## 2021-02-18 LAB — TSH: TSH: 0.103 u[IU]/mL — ABNORMAL LOW (ref 0.308–3.960)

## 2021-02-18 MED ORDER — FAMOTIDINE 20 MG IN NS 100 ML IVPB
INTRAVENOUS | Status: AC
  Filled 2021-02-18: qty 100

## 2021-02-18 MED ORDER — FAMOTIDINE 20 MG IN NS 100 ML IVPB
20.0000 mg | Freq: Once | INTRAVENOUS | Status: AC
  Administered 2021-02-18: 20 mg via INTRAVENOUS

## 2021-02-18 MED ORDER — DIPHENHYDRAMINE HCL 50 MG/ML IJ SOLN
INTRAMUSCULAR | Status: AC
  Filled 2021-02-18: qty 1

## 2021-02-18 MED ORDER — HEPARIN SOD (PORK) LOCK FLUSH 100 UNIT/ML IV SOLN
500.0000 [IU] | Freq: Once | INTRAVENOUS | Status: AC | PRN
  Administered 2021-02-18: 500 [IU]
  Filled 2021-02-18: qty 5

## 2021-02-18 MED ORDER — SODIUM CHLORIDE 0.9% FLUSH
10.0000 mL | INTRAVENOUS | Status: DC | PRN
  Administered 2021-02-18: 10 mL
  Filled 2021-02-18: qty 10

## 2021-02-18 MED ORDER — SODIUM CHLORIDE 0.9 % IV SOLN
200.0000 mg | Freq: Once | INTRAVENOUS | Status: AC
  Administered 2021-02-18: 200 mg via INTRAVENOUS
  Filled 2021-02-18: qty 8

## 2021-02-18 MED ORDER — DEXAMETHASONE SODIUM PHOSPHATE 10 MG/ML IJ SOLN
INTRAMUSCULAR | Status: AC
  Filled 2021-02-18: qty 1

## 2021-02-18 MED ORDER — SODIUM CHLORIDE 0.9% FLUSH
10.0000 mL | INTRAVENOUS | Status: AC | PRN
  Administered 2021-02-18: 10 mL
  Filled 2021-02-18: qty 10

## 2021-02-18 MED ORDER — DEXAMETHASONE SODIUM PHOSPHATE 10 MG/ML IJ SOLN
4.0000 mg | Freq: Once | INTRAMUSCULAR | Status: AC
  Administered 2021-02-18: 4 mg via INTRAVENOUS

## 2021-02-18 MED ORDER — SODIUM CHLORIDE 0.9 % IV SOLN
90.0000 mg/m2 | Freq: Once | INTRAVENOUS | Status: AC
  Administered 2021-02-18: 186 mg via INTRAVENOUS
  Filled 2021-02-18: qty 31

## 2021-02-18 MED ORDER — SODIUM CHLORIDE 0.9 % IV SOLN
Freq: Once | INTRAVENOUS | Status: AC
Start: 2021-02-18 — End: 2021-02-18
  Filled 2021-02-18: qty 250

## 2021-02-18 MED ORDER — DIPHENHYDRAMINE HCL 50 MG/ML IJ SOLN
25.0000 mg | Freq: Once | INTRAMUSCULAR | Status: AC
Start: 2021-02-18 — End: 2021-02-18
  Administered 2021-02-18: 25 mg via INTRAVENOUS

## 2021-02-18 NOTE — Patient Instructions (Signed)
[facility]  Discharge Instructions: Thank you for choosing [facility] to provide your oncology and hematology care.   If you have a lab appointment with the [facility], please go directly to the [facility] and check in at the registration area.   Wear comfortable clothing and clothing appropriate for easy access to any Portacath or PICC line.   We strive to give you quality time with your provider. You may need to reschedule your appointment if you arrive late (15 or more minutes).  Arriving late affects you and other patients whose appointments are after yours.  Also, if you miss three or more appointments without notifying the office, you may be dismissed from the clinic at the provider's discretion.      For prescription refill requests, have your pharmacy contact our office and allow 72 hours for refills to be completed.    Today you received the following chemotherapy and/or immunotherapy agents Taxol/Keytruda    To help prevent nausea and vomiting after your treatment, we encourage you to take your nausea medication as directed.  BELOW ARE SYMPTOMS THAT SHOULD BE REPORTED IMMEDIATELY: . *FEVER GREATER THAN 100.4 F (38 C) OR HIGHER . *CHILLS OR SWEATING . *NAUSEA AND VOMITING THAT IS NOT CONTROLLED WITH YOUR NAUSEA MEDICATION . *UNUSUAL SHORTNESS OF BREATH . *UNUSUAL BRUISING OR BLEEDING . *URINARY PROBLEMS (pain or burning when urinating, or frequent urination) . *BOWEL PROBLEMS (unusual diarrhea, constipation, pain near the anus) . TENDERNESS IN MOUTH AND THROAT WITH OR WITHOUT PRESENCE OF ULCERS (sore throat, sores in mouth, or a toothache) . UNUSUAL RASH, SWELLING OR PAIN  . UNUSUAL VAGINAL DISCHARGE OR ITCHING   Items with * indicate a potential emergency and should be followed up as soon as possible or go to the Emergency Department if any problems should occur.  Please show the CHEMOTHERAPY ALERT CARD or IMMUNOTHERAPY  ALERT CARD at check-in to the Emergency Department and triage nurse.  Should you have questions after your visit or need to cancel or reschedule your appointment, please contact [facility]  Dept: [phone]  and follow the prompts.  Office hours are 8:00 a.m. to 4:30 p.m. Monday - Friday. Please note that voicemails left after 4:00 p.m. may not be returned until the following business day.  We are closed weekends and major holidays. You have access to a nurse at all times for urgent questions. Please call the main number to the clinic Dept: [phone] and follow the prompts.   For any non-urgent questions, you may also contact your provider using MyChart. We now offer e-Visits for anyone 39 and older to request care online for non-urgent symptoms. For details visit [email or web address].   Also download the MyChart app! Go to the app store, search "MyChart", open the app, select [facility], and log in with your MyChart username and password.  Due to Covid, a mask is required upon entering the hospital/clinic. If you do not have a mask, one will be given to you upon arrival. For doctor visits, patients may have 1 support person aged 24 or older with them. For treatment visits, patients cannot have anyone with them due to current Covid guidelines and our immunocompromised population.

## 2021-02-19 LAB — CANCER ANTIGEN 27.29: CA 27.29: 29.8 U/mL (ref 0.0–38.6)

## 2021-03-03 NOTE — Progress Notes (Signed)
[place]  [phone] [phone]     ID: Veronica [name] DOB: [birth year]  MR#: [id number]  XYB#:[id number]  Patient Care Team: Jamie Kato as PCP - General (Family Medicine) Mauro Kaufmann, RN as Oncology Nurse Navigator [name], RN as Oncology Nurse Navigator Erroll Luna, MD as Consulting Physician (General Surgery) Ilia Dimaano, Virgie Dad, MD as Consulting Physician (Oncology) Kyung Rudd, MD as Consulting Physician (Radiation Oncology) Gaye Pollack, MD as Consulting Physician (Cardiothoracic Surgery) Serafina Mitchell, MD as Consulting Physician (Vascular Surgery) Benedict Needy, MD as Referring Physician (Hematology and Oncology) Chauncey Cruel, MD OTHER MD:  CHIEF COMPLAINT: Estrogen receptor positive breast cancer  CURRENT TREATMENT: Paclitaxel day 1 and day 8 of each 21-day cycle, pembrolizumab given with day 8 of each cycle; zoledronate every 12 weeks   INTERVAL HISTORY: Veronica [name] returns today for follow-up and treatment of her now metastatic breast cancer.    She started paclitaxel, given on days 1 and 8, every 21 days, on 11/20/2020.  She started pembrolizumab on 12/17/2020, and it will be continued every 3 weeks on the day 8 of each cycle.   She is having a good response of her measurable disease in the liver. As such, she is continuing treatment and will undergo repeat liver MRI after cycle 8. Today is day 1 cycle 6 of her chemotherapy.  Her last zoledronate was 12/10/2020 and she will receive a dose today  We are following her CA 27-29 which showing a very encouraging trend: Lab Results  Component Value Date   CA2729 29.8 02/18/2021   CA2729 42.0 (H) 01/28/2021   CA2729 42.8 (H) 01/07/2021   CA2729 45.3 (H) 12/31/2020   CA2729 52.6 (H) 12/10/2020   We are following her TSH  Lab Results  Component Value Date   TSH 0.103 (L) 02/18/2021   TSH <0.080 (L) 01/28/2021   TSH 2.628 01/07/2021   TSH  1.932 12/31/2020   TSH 1.924 11/19/2020    REVIEW OF SYSTEMS: Veronica [name] tells me she is walking "better".  That means that she is having less shortness of breath.  She has no cough or pleurisy she is not producing any phlegm and she has had no fever or rash.  She also has no diarrhea or constipation.  She denies pain.  Importantly she denies any peripheral neuropathy symptoms.  A detailed review of systems today was otherwise stable   COVID 19 VACCINATION STATUS: refuses vaccination, had COVID 29 September 2019   HISTORY OF CURRENT ILLNESS: From the original intake note:  Veronica [name] had routine screening mammography on 04/27/2019 showing a possible abnormality in the right breast. She underwent right diagnostic mammography with tomography and right breast ultrasonography at [facility] on 05/01/2019 showing: breast density category C; highly suspicious 1.7 cm irregular mass in the right breast at 10 o'clock, 8 cm from the nipple; indeterminate hypoechoic round mass in the right axillary tail/low axilla. Physical exam showed no suspicious lumps.  Accordingly on 05/02/2019 she proceeded to biopsy of the right breast area in question. The pathology from this procedure ([id number]) showed: invasive ductal carcinoma, grade 3; ductal carcinoma in situ; lymphovascular invasion present. Prognostic indicators significant for: estrogen receptor, 30% positive with moderate staining intensity, and progesterone receptor, 30% positive with strong staining intensity. Proliferation marker Ki67 at 20%. HER2 negative by immunohistochemistry (1+).  The second "satellite" lesion was a lymph node which was also positive.  The patient's subsequent history is as  detailed below.   PAST MEDICAL HISTORY: Past Medical History:  Diagnosis Date  . Cancer [facility]) 2020   breast Cancer, 2022 liver mets  . Chronic low back pain with left-sided sciatica   . Family history of leukemia   . Family history of stomach cancer    . Hypertension   . Personal history of chemotherapy   . Personal history of radiation therapy    finished june '21    PAST SURGICAL HISTORY: Past Surgical History:  Procedure Laterality Date  . APPLICATION OF WOUND VAC N/A 10/16/2019   Procedure: APPLICATION OF WOUND VAC;  Surgeon: Gaye Pollack, MD;  [facility];  Service: Vascular;  Laterality: N/A;  . BREAST BIOPSY Right 05/02/2019   right clips X2  . BREAST LUMPECTOMY Right    06/2019  . BREAST LUMPECTOMY WITH RADIOACTIVE SEED AND SENTINEL LYMPH NODE BIOPSY Right 06/12/2019   Procedure: RIGHT BREAST RADIOACTIVE SEED LUMPECTOMY  AND RIGHT AXILLARY SEED TARGETED LYMPH NODE AND AXILLARY SENTINEL LYMPH NODE MAPPING;  Surgeon: Erroll Luna, MD;  Location: [place];  Service: General;  Laterality: Right;  PEC BLOCK  . C/S x2  '98 '03  . CENTRAL VENOUS CATHETER INSERTION  10/02/2019   Procedure: Insertion Central Line Adult;  Surgeon: Serafina Mitchell, MD;  Location: [place];  Service: Vascular;;  . CESAREAN SECTION    . CHOLECYSTECTOMY    . I & D EXTREMITY N/A 10/16/2019   Procedure: DEBRIDEMENT of DEHISCED MIDLINE SURGICAL INCISION;  Surgeon: Gaye Pollack, MD;  [facility];  Service: Vascular;  Laterality: N/A;  . IR IMAGING GUIDED PORT INSERTION  11/24/2020  . LAPAROSCOPIC ASSISTED VAGINAL HYSTERECTOMY  05/17/2011   Procedure: LAPAROSCOPIC ASSISTED VAGINAL HYSTERECTOMY;  Surgeon: Sharene Butters;  Location: [facility];  Service: Gynecology;  Laterality: N/A;  Laparoscopic Assisted Vaginal Hysterectomy With Lysis Of Adhesions  . PERICARDIAL WINDOW N/A 10/02/2019   Procedure: Pericardial Window;  Surgeon: Serafina Mitchell, MD;  Location: [facility];  Service: Vascular;  Laterality: N/A;  . PORT-A-CATH REMOVAL  10/02/2019   Procedure: Removal Port-A-Cath;  Surgeon: Serafina Mitchell, MD;  Location: [place];  Service: Vascular;;  . PORTACATH PLACEMENT Right 06/12/2019   Procedure: INSERTION PORT-A-CATH WITH ULTRASOUND;   Surgeon: Erroll Luna, MD;  Location: [place];  Service: General;  Laterality: Right;  . RE-EXCISION OF BREAST LUMPECTOMY Right 07/17/2019   Procedure: RE-EXCISION OF RIGHT BREAST LUMPECTOMY;  Surgeon: Erroll Luna, MD;  Location: [place];  Service: General;  Laterality: Right;  . TUBAL LIGATION    . ULTRASOUND GUIDANCE FOR VASCULAR ACCESS  10/02/2019   Procedure: Ultrasound Guidance For Vascular Access;  Surgeon: Serafina Mitchell, MD;  [facility];  Service: Vascular;;  . VENOGRAM N/A 10/02/2019   Procedure: Central VENOGRAM;  Surgeon: Serafina Mitchell, MD;  Location: [facility] OR;  Service: Vascular;  Laterality: N/A;    FAMILY HISTORY: Family History  Problem Relation Age of Onset  . Arthritis Mother   . COPD Mother   . Hypertension Mother   . Hyperlipidemia Mother   . Leukemia Brother 26  . Stomach cancer Maternal Grandmother        late 55s  Patient's parents are living (as of September 2020). Her father is 14 and her mother is 21 as of 04/2019. The patient denies a family hx of breast or ovarian cancer. She has 7 siblings, 6 brothers and 1 sister. She reports her maternal grandmother was diagnosed  with stomach cancer at age 29. Her brother was diagnosed with leukemia at age 39.   GYNECOLOGIC HISTORY:  No LMP recorded. Patient has had a hysterectomy. Menarche: 50 years old Age at first live birth: 50 years old GX P 2 (+1 stillborn) LMP age 26-42 Contraceptive: unsure, maybe for a year at age 24. HRT no Hysterectomy? Yes, 05/2011 BSO? 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 She is married. Husband Sheppard Plumber") is a Hydrologist for a telephone company. She lives at home with her husband and two daughters. Daughter Suszanne Conners, age 46, is a Scientist, water quality. Daughter Jarrett Soho, age 20, is a Ship broker.     ADVANCED DIRECTIVES: Husband Heath Lark is her HCPOA.   HEALTH MAINTENANCE: Social History    Tobacco Use  . Smoking status: Never Smoker  . Smokeless tobacco: Never Used  Vaping Use  . Vaping Use: Never used  Substance Use Topics  . Alcohol use: Not Currently    Alcohol/week: 2.0 standard drinks    Types: 2 Standard drinks or equivalent per week    Comment: "2-3 a week"  . Drug use: No     Colonoscopy: n/a  PAP: 02/2018  Bone density: n/a   No Known Allergies  Current Outpatient Medications  Medication Sig Dispense Refill  . Cholecalciferol (VITAMIN D3) 25 MCG (1000 UT) CHEW Chew 1 tablet by mouth daily.     Marland Kitchen doxycycline (VIBRA-TABS) 100 MG tablet Take 1 tablet (100 mg total) by mouth daily. 30 tablet 4  . gabapentin (NEURONTIN) 300 MG capsule Take 1 capsule (300 mg total) by mouth at bedtime as needed. 90 capsule 4  . metoprolol tartrate (LOPRESSOR) 25 MG tablet Take 1 tablet (25 mg total) by mouth 2 (two) times daily. 60 tablet 0  . ondansetron (ZOFRAN) 8 MG tablet Take 1 tablet (8 mg total) by mouth 2 (two) times daily as needed (Nausea or vomiting). 30 tablet 1  . prochlorperazine (COMPAZINE) 10 MG tablet Take 1 tablet (10 mg total) by mouth every 6 (six) hours as needed (Nausea or vomiting). 90 tablet 1  . rivaroxaban (XARELTO) 20 MG TABS tablet Take 1 tablet (20 mg total) by mouth daily with supper. 90 tablet 3   No current facility-administered medications for this visit.    OBJECTIVE: white woman who appears stated age  75:   03/04/21 0805  BP: 124/69  Pulse: 78  Resp: 18  Temp: (!) 97.3 F (36.3 C)  SpO2: 100%   Wt Readings from Last 3 Encounters:  03/04/21 225 lb 6.4 oz (102.2 kg)  02/18/21 224 lb 1.9 oz (101.7 kg)  02/11/21 225 lb 6.4 oz (102.2 kg)   Body mass index is 42.59 kg/m.    ECOG FS:1 - Symptomatic but completely ambulatory  Sclerae unicteric, EOMs intact Wearing a mask No cervical or supraclavicular adenopathy Lungs no rales or rhonchi Heart regular rate and rhythm Abd soft, nontender, positive bowel sounds MSK no focal  spinal tenderness, no upper extremity lymphedema Neuro: nonfocal, well oriented, appropriate affect Breasts: Deferred   LAB RESULTS:  CMP     Component Value Date/Time   NA 138 02/18/2021 0739   K 4.0 02/18/2021 0739   CL 106 02/18/2021 0739   CO2 23 02/18/2021 0739   GLUCOSE 219 (H) 02/18/2021 0739   BUN 14 02/18/2021 0739   CREATININE 0.71 02/18/2021 0739   CREATININE 0.75 04/24/2020 1035   CALCIUM 9.6 02/18/2021 0739   PROT 6.1 (L) 02/18/2021 4037  ALBUMIN 3.4 (L) 02/18/2021 0739   AST 21 02/18/2021 0739   AST 23 04/24/2020 1035   ALT 26 02/18/2021 0739   ALT 20 04/24/2020 1035   ALKPHOS 72 02/18/2021 0739   BILITOT 0.9 02/18/2021 0739   BILITOT 1.1 04/24/2020 1035   GFRNONAA >60 02/18/2021 0739   GFRNONAA >60 04/24/2020 1035   GFRAA >60 04/24/2020 1035    No results found for: TOTALPROTELP, ALBUMINELP, A1GS, A2GS, BETS, BETA2SER, GAMS, MSPIKE, SPEI  No results found for: KPAFRELGTCHN, LAMBDASER, KAPLAMBRATIO  Lab Results  Component Value Date   WBC 4.4 02/18/2021   NEUTROABS 3.3 02/18/2021   HGB 11.3 (L) 02/18/2021   HCT 33.8 (L) 02/18/2021   MCV 84.5 02/18/2021   PLT 224 02/18/2021   No results found for: LABCA2  No components found for: [id number]  No results for input(s): INR in the last 168 hours.  No results found for: LABCA2  No results found for: KGY185  No results found for: CAN125  No results found for: UDJ497  Lab Results  Component Value Date   CA2729 29.8 02/18/2021    No components found for: HGQUANT  Lab Results  Component Value Date   CEA1 2.45 10/29/2020   /  CEA ([facility]In [facility])  Date Value Ref Range Status  10/29/2020 2.45 0.00 - 5.00 ng/mL Final    Comment:    (NOTE) This test was performed using Architect's Chemiluminescent Microparticle Immunoassay. Values obtained from different assay methods cannot be used interchangeably. Please note that 5-10% of patients who smoke may see CEA levels up to 6.9 ng/mL. Performed  at [facility], [place] [address] [place], [place] [address]      No results found for: AFPTUMOR  No results found for: [place]  No results found for: HGBA, HGBA2QUANT, HGBFQUANT, HGBSQUAN (Hemoglobinopathy evaluation)   Lab Results  Component Value Date   LDH 170 09/30/2019    No results found for: IRON, TIBC, IRONPCTSAT (Iron and TIBC)  Lab Results  Component Value Date   FERRITIN 498 (H) 10/19/2019    Urinalysis    Component Value Date/Time   COLORURINE YELLOW 08/28/2019 2326   APPEARANCEUR CLOUDY (A) 08/28/2019 2326   LABSPEC 1.013 08/28/2019 2326   PHURINE 5.0 08/28/2019 2326   GLUCOSEU NEGATIVE 08/28/2019 2326   HGBUR MODERATE (A) 08/28/2019 2326   BILIRUBINUR NEGATIVE 08/28/2019 2326   KETONESUR NEGATIVE 08/28/2019 2326   PROTEINUR NEGATIVE 08/28/2019 2326   NITRITE NEGATIVE 08/28/2019 2326   LEUKOCYTESUR MODERATE (A) 08/28/2019 2326    STUDIES: MR LIVER W WO CONTRAST  Result Date: 02/08/2021 CLINICAL DATA:  Breast cancer metastatic to liver, assess treatment response EXAM: MRI ABDOMEN WITHOUT AND WITH CONTRAST TECHNIQUE: Multiplanar multisequence MR imaging of the abdomen was performed both before and after the administration of intravenous contrast. CONTRAST:  34m GADAVIST GADOBUTROL 1 MMOL/ML IV SOLN COMPARISON:  10/22/2020 FINDINGS: Lower chest: No acute findings. Hepatobiliary: There are multiple bilateral hypoenhancing lesions of the liver, diminished in size compared to prior examination and without residual rim hyperenhancement as seen on prior examination. Largest index lesion of the subcapsular anterior right lobe of the liver, hepatic segment VIII, measures 2.7 x 2.2 cm, previously 4.2 x 3.4 cm when measured similarly (series 16, image 29). Additional index lesion of the anterior liver dome, hepatic segment VII, measures 3.9 x 1.9 cm, previously 4.7 x 2.8 cm when measured similarly (series 16, image 25). Status post  cholecystectomy. No biliary ductal dilatation. Pancreas: No mass, inflammatory changes,  or other parenchymal abnormality identified. Spleen:  Within normal limits in size and appearance. Adrenals/Urinary Tract: No masses identified. No evidence of hydronephrosis. Stomach/Bowel: Visualized portions within the abdomen are unremarkable. Vascular/Lymphatic: No pathologically enlarged lymph nodes identified. No abdominal aortic aneurysm demonstrated. Other:  None. Musculoskeletal: No suspicious bone lesions identified. IMPRESSION: 1. Multiple bilateral hypoenhancing lesions of the liver, significantly diminished in size compared to prior examination and without residual rim hyperenhancement. Findings are consistent with interval treatment response. 2.  Status post cholecystectomy. Electronically Signed   By: Eddie Candle M.D.   On: 02/08/2021 19:35     ELIGIBLE FOR AVAILABLE RESEARCH PROTOCOL: AET  ASSESSMENT: 50 y.o. [place], [place] woman status post right breast upper outer quadrant biopsy 05/01/2019 for a clinical T1c N1, stage IIA invasive ductal carcinoma, grade 3, estrogen and progesterone receptor positive, HER-2 nonamplified, with an MIB-1-1 of 20%.  (a) mass in the axillary tail was a positive lymph node  (1) MammaPrint obtained from the original biopsy shows a high risk luminal B tumor and predicts a 5-year disease-free survival of 91% in her case  (2) genetics testing 05/09/2019 through the Common Hereditary Gene Panel offered by [facility] found no deleterious mutations in APC, ATM, AXIN2, BARD1, BMPR1A, BRCA1, BRCA2, BRIP1, CDH1, CDK4, CDKN2A (p14ARF), CDKN2A (p16INK4a), CHEK2, CTNNA1, DICER1, EPCAM (Deletion/duplication testing only), GREM1 (promoter region deletion/duplication testing only), KIT, MEN1, MLH1, MSH2, MSH3, MSH6, MUTYH, NBN, NF1, NHTL1, PALB2, PDGFRA, PMS2, POLD1, POLE, PTEN, RAD50, RAD51C, RAD51D, RNF43, SDHB, SDHC, SDHD, SMAD4, SMARCA4. STK11, TP53, TSC1, TSC2, and VHL.  The following  genes were evaluated for sequence changes only: SDHA and HOXB13 c.251G>A variant only.   (a) A variant of uncertain significance (VUS) was detected in one of her MSH6 genes (c.831A>C).  (3) status post right lumpectomy and sentinel lymph node sampling 06/12/2019 for a pT2 pN1, stage IIA invasive ductal carcinoma, grade 2, with positive margins  (a) a total of 4 sentinel lymph nodes removed, one positive (with ECE), ine itc  (b) margin clearance 04/19/2019 successful (medial margin close but negative for DCIS)  (4) adjuvant chemotherapy consisting of doxorubicin and cyclophosphamide in dose dense fashion x4 starting 07/10/2019, completed 09/24/2019; planned weekly paclitaxel x12 omitted   (a) echo 06/26/2019 shows an EF of 60-65%  (b echo on 09/19/2019 shows an EF of 60-65%  (c) chemotherapy stopped after four cycles of doxorubicin and cyclophosphamide due to #5 below  (5) Multiple VTE/PE documented 10/02/2019, on intravenous heparin initially, then Xarelto started 10/19/2019  (a) presented with SVC syndrome and bilateral pulmonary emboli on 09/28/2019  (b) s/p SVC thrombectomy and port removal 34/19/6222 complicated by hemopericardium and tamponade, necessitating pericardial window placement   (c) postop course complicated by wound dehiscence requiring wound VAC  (c) Doppler 10/03/2019 showed right lower extremity DVT  (d) CT angio and Doppler of both upper extremities 10/13/2019 found persistent acute bilateral pulmonary emboli, persistent but improved SVC thrombus, bilateral pleural effusions, and right lower lobe collapse. Bilateral upper extremity DVTs also noted  (e) Dopplers upper extremity 11/19/2019 showed clots cleared on the left, chronic DVT right internal jugular and axillary veins  (f) CT angio of the chest 10/07/2020 shows resolution of earlier pleural effusions and no evidence of active embolism (but see #9 below)  (6) COVID-19 infection documented 09/27/2020, status post  remdesivir  (7) adjuvant radiation 12/24/19 - 02/06/20 Site/dose:   The patient initially received a dose of 50.4 Gy in 28 fractions to the breast and SCLV region using a 4-field approach.  This was delivered using a 3-D conformal technique. The patient then received a boost to the seroma. This delivered an additional 10 Gy in 5 fractions using a 3-field photon boost technique. The total dose was 60.4 Gy.  (8) anastrozole started mid May 2021  (a) [facility] and estradiol obtained 11/15/2019 consistent with menopause  (b) not a good candidate for tamoxifen given history of DVT/PE above  (c) bone density 07/31/2020 normal (T score equals 0.1).  METASTATIC DISEASE: Jan 2022 (9) CT angio of the chest 12/28/2021shows new liver lesions (not seen on CT angio January 2021)  (a) MRI 10/22/2020 shows multiple liver lesions, the largest 2.5 cm  (b)   liver biopsy 11/03/2020 confirms metastatic carcinoma estrogen receptor 10% positive with weak staining intensity, HER-2 and progesterone receptor negative.  (c) bone scan 11/25/2020 shows left femoral lesion as well as other lesions   (i) left hip films not suggestive of impending pathologic fracture  (d) non-contrast head CT 11/25/2020 negative except for sinusitis  (e) CA 27-29 is informative (was 104.1 on 10/29/2020)  (10) foundation 1 study requested on 11/03/2020 liver biopsy dated 11/03/2020 shows PI K3 CA amplification, and a T p53 mutation.  There were no mutations in BRCA1 and BRCA2 or HER-2.  There is amplification of PRK CI, S0X2, T ERC, and FGF 1 2.  The microsatellite status is stable.  The tumor mutational burden is 4 mutations per Mb  (11) started paclitaxel 11/19/2018 2012, repeated day 1 day 8 of every 21-day cycle  (a) pembrolizumab added 12/17/2020, repeated every 21 days  (b) MRI liver 01/21/2021 (after 3 cycles) shows significant response  (12) zoledronate added 12/10/2020, repeated every 12 weeks   PLAN:  Chrisy Hillebrand will start her sixth  of 8 planned cycles of paclitaxel/pembrolizumab today.  She is tolerating treatment well and she is having an excellent response as noted in her normalization of the CA 27-29 and the recent liver MRI.  Importantly there has been no peripheral neuropathy so far  She will receive zoledronate today.  I asked her to take some extra calcium to prevent transient hypocalcemia later today.  She will take 3 or 4 times at some point today  She will receive pembrolizumab with her day 8 treatment.  She has had no problems from that so far.  She will return to see me 03/25/2021 for the start of cycle 7.  When she completes cycle 8 she will be restaged  Total encounter time 20 minutes.Sarajane Jews C. Merlene Dante, MD Medical Oncology and Hematology [facility] [place], [place] [address] Tel. [phone]    Fax. [phone]   I, Wilburn Mylar, am acting as scribe for Dr. Virgie Dad. Benny Henrie.  I, Lurline Del MD, have reviewed the above documentation for accuracy and completeness, and I agree with the above.   *Total Encounter Time as defined by the Centers for Medicare and Medicaid Services includes, in addition to the face-to-face time of a patient visit (documented in the note above) non-face-to-face time: obtaining and reviewing outside history, ordering and reviewing medications, tests or procedures, care coordination (communications with other health care professionals or caregivers) and documentation in the medical record.

## 2021-03-04 ENCOUNTER — Inpatient Hospital Stay: Payer: 59

## 2021-03-04 ENCOUNTER — Other Ambulatory Visit: Payer: Self-pay

## 2021-03-04 ENCOUNTER — Inpatient Hospital Stay (HOSPITAL_BASED_OUTPATIENT_CLINIC_OR_DEPARTMENT_OTHER): Payer: 59 | Admitting: Oncology

## 2021-03-04 ENCOUNTER — Other Ambulatory Visit: Payer: Self-pay | Admitting: Oncology

## 2021-03-04 VITALS — BP 124/69 | HR 78 | Temp 97.3°F | Resp 18 | Ht 61.0 in | Wt 225.4 lb

## 2021-03-04 DIAGNOSIS — C787 Secondary malignant neoplasm of liver and intrahepatic bile duct: Secondary | ICD-10-CM

## 2021-03-04 DIAGNOSIS — C7951 Secondary malignant neoplasm of bone: Secondary | ICD-10-CM

## 2021-03-04 DIAGNOSIS — C50411 Malignant neoplasm of upper-outer quadrant of right female breast: Secondary | ICD-10-CM

## 2021-03-04 DIAGNOSIS — Z95828 Presence of other vascular implants and grafts: Secondary | ICD-10-CM

## 2021-03-04 DIAGNOSIS — Z170 Estrogen receptor positive status [ER+]: Secondary | ICD-10-CM

## 2021-03-04 DIAGNOSIS — Z5112 Encounter for antineoplastic immunotherapy: Secondary | ICD-10-CM | POA: Diagnosis not present

## 2021-03-04 DIAGNOSIS — Z7189 Other specified counseling: Secondary | ICD-10-CM

## 2021-03-04 LAB — CBC WITH DIFFERENTIAL/PLATELET
Abs Immature Granulocytes: 0.08 10*3/uL — ABNORMAL HIGH (ref 0.00–0.07)
Basophils Absolute: 0 10*3/uL (ref 0.0–0.1)
Basophils Relative: 1 %
Eosinophils Absolute: 0.1 10*3/uL (ref 0.0–0.5)
Eosinophils Relative: 2 %
HCT: 37.5 % (ref 36.0–46.0)
Hemoglobin: 12.7 g/dL (ref 12.0–15.0)
Immature Granulocytes: 2 %
Lymphocytes Relative: 21 %
Lymphs Abs: 0.8 10*3/uL (ref 0.7–4.0)
MCH: 28.3 pg (ref 26.0–34.0)
MCHC: 33.9 g/dL (ref 30.0–36.0)
MCV: 83.7 fL (ref 80.0–100.0)
Monocytes Absolute: 0.7 10*3/uL (ref 0.1–1.0)
Monocytes Relative: 17 %
Neutro Abs: 2.1 10*3/uL (ref 1.7–7.7)
Neutrophils Relative %: 57 %
Platelets: 247 10*3/uL (ref 150–400)
RBC: 4.48 MIL/uL (ref 3.87–5.11)
RDW: 15.3 % (ref 11.5–15.5)
WBC: 3.8 10*3/uL — ABNORMAL LOW (ref 4.0–10.5)
nRBC: 0 % (ref 0.0–0.2)

## 2021-03-04 LAB — COMPREHENSIVE METABOLIC PANEL
ALT: 30 U/L (ref 0–44)
AST: 26 U/L (ref 15–41)
Albumin: 3.6 g/dL (ref 3.5–5.0)
Alkaline Phosphatase: 74 U/L (ref 38–126)
Anion gap: 10 (ref 5–15)
BUN: 13 mg/dL (ref 6–20)
CO2: 23 mmol/L (ref 22–32)
Calcium: 9.6 mg/dL (ref 8.9–10.3)
Chloride: 106 mmol/L (ref 98–111)
Creatinine, Ser: 0.7 mg/dL (ref 0.44–1.00)
GFR, Estimated: >60 mL/min (ref >60)
Glucose, Bld: 199 mg/dL — ABNORMAL HIGH (ref 70–99)
Potassium: 3.9 mmol/L (ref 3.5–5.1)
Sodium: 139 mmol/L (ref 135–145)
Total Bilirubin: 1 mg/dL (ref 0.3–1.2)
Total Protein: 6.5 g/dL (ref 6.5–8.1)

## 2021-03-04 LAB — TSH: TSH: 11.424 u[IU]/mL — ABNORMAL HIGH (ref 0.308–3.960)

## 2021-03-04 MED ORDER — DIPHENHYDRAMINE HCL 50 MG/ML IJ SOLN
INTRAMUSCULAR | Status: AC
  Filled 2021-03-04: qty 1

## 2021-03-04 MED ORDER — FAMOTIDINE 20 MG IN NS 100 ML IVPB
INTRAVENOUS | Status: AC
  Filled 2021-03-04: qty 100

## 2021-03-04 MED ORDER — SODIUM CHLORIDE 0.9 % IV SOLN
Freq: Once | INTRAVENOUS | Status: AC
  Filled 2021-03-04: qty 250

## 2021-03-04 MED ORDER — SODIUM CHLORIDE 0.9% FLUSH
10.0000 mL | INTRAVENOUS | Status: AC | PRN
Start: 2021-03-04 — End: ?
  Administered 2021-03-04: 10 mL via INTRAVENOUS
  Filled 2021-03-04: qty 10

## 2021-03-04 MED ORDER — ZOLEDRONIC ACID 4 MG/100ML IV SOLN
INTRAVENOUS | Status: AC
  Filled 2021-03-04: qty 100

## 2021-03-04 MED ORDER — DEXAMETHASONE SODIUM PHOSPHATE 10 MG/ML IJ SOLN
INTRAMUSCULAR | Status: AC
  Filled 2021-03-04: qty 1

## 2021-03-04 MED ORDER — LEVOTHYROXINE SODIUM 25 MCG PO TABS
25.0000 ug | ORAL_TABLET | Freq: Every day | ORAL | 2 refills | Status: DC
Start: 2021-03-04 — End: 2021-03-25

## 2021-03-04 MED ORDER — DIPHENHYDRAMINE HCL 50 MG/ML IJ SOLN
25.0000 mg | Freq: Once | INTRAMUSCULAR | Status: AC
  Administered 2021-03-04: 25 mg via INTRAVENOUS

## 2021-03-04 MED ORDER — SODIUM CHLORIDE 0.9 % IV SOLN
90.0000 mg/m2 | Freq: Once | INTRAVENOUS | Status: AC
  Administered 2021-03-04: 186 mg via INTRAVENOUS
  Filled 2021-03-04: qty 31

## 2021-03-04 MED ORDER — SODIUM CHLORIDE 0.9% FLUSH
10.0000 mL | INTRAVENOUS | Status: DC | PRN
  Administered 2021-03-04: 10 mL
  Filled 2021-03-04: qty 10

## 2021-03-04 MED ORDER — FAMOTIDINE 20 MG IN NS 100 ML IVPB
20.0000 mg | Freq: Once | INTRAVENOUS | Status: AC
  Administered 2021-03-04: 20 mg via INTRAVENOUS

## 2021-03-04 MED ORDER — HEPARIN SOD (PORK) LOCK FLUSH 100 UNIT/ML IV SOLN
500.0000 [IU] | Freq: Once | INTRAVENOUS | Status: AC | PRN
  Administered 2021-03-04: 500 [IU]
  Filled 2021-03-04: qty 5

## 2021-03-04 MED ORDER — ZOLEDRONIC ACID 4 MG/100ML IV SOLN
4.0000 mg | Freq: Once | INTRAVENOUS | Status: AC
  Administered 2021-03-04: 4 mg via INTRAVENOUS

## 2021-03-04 MED ORDER — DEXAMETHASONE SODIUM PHOSPHATE 10 MG/ML IJ SOLN
4.0000 mg | Freq: Once | INTRAMUSCULAR | Status: AC
Start: 2021-03-04 — End: 2021-03-04
  Administered 2021-03-04: 4 mg via INTRAVENOUS

## 2021-03-04 NOTE — Patient Instructions (Signed)
[facility] MEDICAL ONCOLOGY  Discharge Instructions: Thank you for choosing [facility] to provide your oncology and hematology care.   If you have a lab appointment with the Cancer Center, please go directly to the Cancer Center and check in at the registration area.   Wear comfortable clothing and clothing appropriate for easy access to any Portacath or PICC line.   We strive to give you quality time with your provider. You may need to reschedule your appointment if you arrive late (15 or more minutes).  Arriving late affects you and other patients whose appointments are after yours.  Also, if you miss three or more appointments without notifying the office, you may be dismissed from the clinic at the provider's discretion.      For prescription refill requests, have your pharmacy contact our office and allow 72 hours for refills to be completed.    Today you received the following chemotherapy and/or immunotherapy agents taxol      To help prevent nausea and vomiting after your treatment, we encourage you to take your nausea medication as directed.  BELOW ARE SYMPTOMS THAT SHOULD BE REPORTED IMMEDIATELY: *FEVER GREATER THAN 100.4 F (38 C) OR HIGHER *CHILLS OR SWEATING *NAUSEA AND VOMITING THAT IS NOT CONTROLLED WITH YOUR NAUSEA MEDICATION *UNUSUAL SHORTNESS OF BREATH *UNUSUAL BRUISING OR BLEEDING *URINARY PROBLEMS (pain or burning when urinating, or frequent urination) *BOWEL PROBLEMS (unusual diarrhea, constipation, pain near the anus) TENDERNESS IN MOUTH AND THROAT WITH OR WITHOUT PRESENCE OF ULCERS (sore throat, sores in mouth, or a toothache) UNUSUAL RASH, SWELLING OR PAIN  UNUSUAL VAGINAL DISCHARGE OR ITCHING   Items with * indicate a potential emergency and should be followed up as soon as possible or go to the Emergency Department if any problems should occur.  Please show the CHEMOTHERAPY ALERT CARD or IMMUNOTHERAPY ALERT CARD at check-in to the  Emergency Department and triage nurse.  Should you have questions after your visit or need to cancel or reschedule your appointment, please contact [facility]ICAL [facility]COL[facility]  Dept: [phone]  and follow the prompts.  Office hours are 8:00 a.m. to 4:30 p.m. Monday - Friday. Please note that voicemails left after 4:00 p.m. may not be returned until the following business day.  We are closed weekends and major holidays. You have access to a nurse at all times for urgent questions. Please call the main number to the clinic Dept: [phone] and follow the prompts.   For any non-urgent questions, you may also contact your provider using MyChart. We now offer e-Visits for anyone 18 and older to request care online for non-urgent symptoms. For details visit [email or web address].   Also download the MyChart app! Go to the app store, search "MyChart", open the app, select [facility], and log in with your MyChart username and password.  Due to Covid, a mask is required upon entering the hospital/clinic. If you do not have a mask, one will be given to you upon arrival. For doctor visits, patients may have 1 support person aged 18 or older with them. For treatment visits, patients cannot have anyone with them due to current Covid guidelines and our immunocompromised population.   

## 2021-03-04 NOTE — Progress Notes (Signed)
Lillie's TSH is now over 10.  I am starting her on low-dose Synthroid and we will continue to follow this with each treatment.

## 2021-03-05 LAB — CANCER ANTIGEN 27.29: CA 27.29: 44.2 U/mL — ABNORMAL HIGH (ref 0.0–38.6)

## 2021-03-11 ENCOUNTER — Other Ambulatory Visit: Payer: 59

## 2021-03-12 ENCOUNTER — Inpatient Hospital Stay: Payer: 59 | Attending: Oncology

## 2021-03-12 ENCOUNTER — Ambulatory Visit: Payer: 59

## 2021-03-12 ENCOUNTER — Inpatient Hospital Stay: Payer: 59

## 2021-03-12 ENCOUNTER — Other Ambulatory Visit: Payer: Self-pay

## 2021-03-12 VITALS — BP 122/58 | HR 71 | Temp 98.5°F | Resp 16 | Wt 227.5 lb

## 2021-03-12 DIAGNOSIS — Z5111 Encounter for antineoplastic chemotherapy: Secondary | ICD-10-CM | POA: Insufficient documentation

## 2021-03-12 DIAGNOSIS — C773 Secondary and unspecified malignant neoplasm of axilla and upper limb lymph nodes: Secondary | ICD-10-CM | POA: Insufficient documentation

## 2021-03-12 DIAGNOSIS — C50411 Malignant neoplasm of upper-outer quadrant of right female breast: Secondary | ICD-10-CM | POA: Diagnosis present

## 2021-03-12 DIAGNOSIS — C787 Secondary malignant neoplasm of liver and intrahepatic bile duct: Secondary | ICD-10-CM | POA: Insufficient documentation

## 2021-03-12 DIAGNOSIS — Z170 Estrogen receptor positive status [ER+]: Secondary | ICD-10-CM

## 2021-03-12 DIAGNOSIS — Z5112 Encounter for antineoplastic immunotherapy: Secondary | ICD-10-CM | POA: Insufficient documentation

## 2021-03-12 DIAGNOSIS — Z95828 Presence of other vascular implants and grafts: Secondary | ICD-10-CM

## 2021-03-12 DIAGNOSIS — Z79899 Other long term (current) drug therapy: Secondary | ICD-10-CM | POA: Diagnosis not present

## 2021-03-12 DIAGNOSIS — Z7189 Other specified counseling: Secondary | ICD-10-CM

## 2021-03-12 DIAGNOSIS — C7951 Secondary malignant neoplasm of bone: Secondary | ICD-10-CM | POA: Diagnosis not present

## 2021-03-12 LAB — CBC WITH DIFFERENTIAL/PLATELET
Abs Immature Granulocytes: 0.16 10*3/uL — ABNORMAL HIGH (ref 0.00–0.07)
Basophils Absolute: 0.1 10*3/uL (ref 0.0–0.1)
Basophils Relative: 1 %
Eosinophils Absolute: 0.1 10*3/uL (ref 0.0–0.5)
Eosinophils Relative: 2 %
HCT: 37.1 % (ref 36.0–46.0)
Hemoglobin: 12.1 g/dL (ref 12.0–15.0)
Immature Granulocytes: 3 %
Lymphocytes Relative: 17 %
Lymphs Abs: 1 10*3/uL (ref 0.7–4.0)
MCH: 27.8 pg (ref 26.0–34.0)
MCHC: 32.6 g/dL (ref 30.0–36.0)
MCV: 85.1 fL (ref 80.0–100.0)
Monocytes Absolute: 0.3 10*3/uL (ref 0.1–1.0)
Monocytes Relative: 5 %
Neutro Abs: 3.9 10*3/uL (ref 1.7–7.7)
Neutrophils Relative %: 72 %
Platelets: 258 10*3/uL (ref 150–400)
RBC: 4.36 MIL/uL (ref 3.87–5.11)
RDW: 14.6 % (ref 11.5–15.5)
WBC: 5.5 10*3/uL (ref 4.0–10.5)
nRBC: 0.4 % — ABNORMAL HIGH (ref 0.0–0.2)

## 2021-03-12 LAB — COMPREHENSIVE METABOLIC PANEL
ALT: 19 U/L (ref 0–44)
AST: 17 U/L (ref 15–41)
Albumin: 3.5 g/dL (ref 3.5–5.0)
Alkaline Phosphatase: 76 U/L (ref 38–126)
Anion gap: 12 (ref 5–15)
BUN: 10 mg/dL (ref 6–20)
CO2: 21 mmol/L — ABNORMAL LOW (ref 22–32)
Calcium: 9 mg/dL (ref 8.9–10.3)
Chloride: 107 mmol/L (ref 98–111)
Creatinine, Ser: 0.72 mg/dL (ref 0.44–1.00)
GFR, Estimated: >60 mL/min (ref >60)
Glucose, Bld: 179 mg/dL — ABNORMAL HIGH (ref 70–99)
Potassium: 3.9 mmol/L (ref 3.5–5.1)
Sodium: 140 mmol/L (ref 135–145)
Total Bilirubin: 0.7 mg/dL (ref 0.3–1.2)
Total Protein: 6.2 g/dL — ABNORMAL LOW (ref 6.5–8.1)

## 2021-03-12 LAB — TSH: TSH: 26.522 u[IU]/mL — ABNORMAL HIGH (ref 0.308–3.960)

## 2021-03-12 MED ORDER — DIPHENHYDRAMINE HCL 50 MG/ML IJ SOLN
INTRAMUSCULAR | Status: AC
  Filled 2021-03-12: qty 1

## 2021-03-12 MED ORDER — SODIUM CHLORIDE 0.9% FLUSH
10.0000 mL | INTRAVENOUS | Status: DC | PRN
  Administered 2021-03-12: 10 mL
  Filled 2021-03-12: qty 10

## 2021-03-12 MED ORDER — SODIUM CHLORIDE 0.9 % IV SOLN
Freq: Once | INTRAVENOUS | Status: AC
  Filled 2021-03-12: qty 250

## 2021-03-12 MED ORDER — DIPHENHYDRAMINE HCL 50 MG/ML IJ SOLN
25.0000 mg | Freq: Once | INTRAMUSCULAR | Status: AC
  Administered 2021-03-12: 25 mg via INTRAVENOUS

## 2021-03-12 MED ORDER — SODIUM CHLORIDE 0.9% FLUSH
10.0000 mL | Freq: Once | INTRAVENOUS | Status: AC
  Administered 2021-03-12: 10 mL via INTRAVENOUS
  Filled 2021-03-12: qty 10

## 2021-03-12 MED ORDER — FAMOTIDINE 20 MG IN NS 100 ML IVPB
INTRAVENOUS | Status: AC
  Filled 2021-03-12: qty 100

## 2021-03-12 MED ORDER — SODIUM CHLORIDE 0.9 % IV SOLN
200.0000 mg | Freq: Once | INTRAVENOUS | Status: AC
  Administered 2021-03-12: 200 mg via INTRAVENOUS
  Filled 2021-03-12: qty 8

## 2021-03-12 MED ORDER — SODIUM CHLORIDE 0.9 % IV SOLN
90.0000 mg/m2 | Freq: Once | INTRAVENOUS | Status: AC
  Administered 2021-03-12: 186 mg via INTRAVENOUS
  Filled 2021-03-12: qty 31

## 2021-03-12 MED ORDER — HEPARIN SOD (PORK) LOCK FLUSH 100 UNIT/ML IV SOLN
500.0000 [IU] | Freq: Once | INTRAVENOUS | Status: AC | PRN
Start: 2021-03-12 — End: 2021-03-12
  Administered 2021-03-12: 500 [IU]
  Filled 2021-03-12: qty 5

## 2021-03-12 MED ORDER — FAMOTIDINE 20 MG IN NS 100 ML IVPB
20.0000 mg | Freq: Once | INTRAVENOUS | Status: AC
  Administered 2021-03-12: 20 mg via INTRAVENOUS

## 2021-03-12 MED ORDER — DEXAMETHASONE SODIUM PHOSPHATE 10 MG/ML IJ SOLN
4.0000 mg | Freq: Once | INTRAMUSCULAR | Status: AC
  Administered 2021-03-12: 4 mg via INTRAVENOUS

## 2021-03-12 MED ORDER — DEXAMETHASONE SODIUM PHOSPHATE 10 MG/ML IJ SOLN
INTRAMUSCULAR | Status: AC
  Filled 2021-03-12: qty 1

## 2021-03-12 NOTE — Patient Instructions (Signed)

## 2021-03-12 NOTE — Patient Instructions (Signed)
[facility]  Discharge Instructions: Thank you for choosing [facility] to provide your oncology and hematology care.   If you have a lab appointment with the [facility], please go directly to the [facility] and check in at the registration area.   Wear comfortable clothing and clothing appropriate for easy access to any Portacath or PICC line.   We strive to give you quality time with your provider. You may need to reschedule your appointment if you arrive late (15 or more minutes).  Arriving late affects you and other patients whose appointments are after yours.  Also, if you miss three or more appointments without notifying the office, you may be dismissed from the clinic at the provider's discretion.      For prescription refill requests, have your pharmacy contact our office and allow 72 hours for refills to be completed.    Today you received the following chemotherapy and/or immunotherapy agents Keytruda and Taxol.    To help prevent nausea and vomiting after your treatment, we encourage you to take your nausea medication as directed.  BELOW ARE SYMPTOMS THAT SHOULD BE REPORTED IMMEDIATELY: . *FEVER GREATER THAN 100.4 F (38 C) OR HIGHER . *CHILLS OR SWEATING . *NAUSEA AND VOMITING THAT IS NOT CONTROLLED WITH YOUR NAUSEA MEDICATION . *UNUSUAL SHORTNESS OF BREATH . *UNUSUAL BRUISING OR BLEEDING . *URINARY PROBLEMS (pain or burning when urinating, or frequent urination) . *BOWEL PROBLEMS (unusual diarrhea, constipation, pain near the anus) . TENDERNESS IN MOUTH AND THROAT WITH OR WITHOUT PRESENCE OF ULCERS (sore throat, sores in mouth, or a toothache) . UNUSUAL RASH, SWELLING OR PAIN  . UNUSUAL VAGINAL DISCHARGE OR ITCHING   Items with * indicate a potential emergency and should be followed up as soon as possible or go to the Emergency Department if any problems should occur.  Please show the CHEMOTHERAPY ALERT CARD or IMMUNOTHERAPY  ALERT CARD at check-in to the Emergency Department and triage nurse.  Should you have questions after your visit or need to cancel or reschedule your appointment, please contact [facility]  Dept: [phone]  and follow the prompts.  Office hours are 8:00 a.m. to 4:30 p.m. Monday - Friday. Please note that voicemails left after 4:00 p.m. may not be returned until the following business day.  We are closed weekends and major holidays. You have access to a nurse at all times for urgent questions. Please call the main number to the clinic Dept: [phone] and follow the prompts.   For any non-urgent questions, you may also contact your provider using MyChart. We now offer e-Visits for anyone 62 and older to request care online for non-urgent symptoms. For details visit [email or web address].   Also download the MyChart app! Go to the app store, search "MyChart", open the app, select [facility], and log in with your MyChart username and password.  Due to Covid, a mask is required upon entering the hospital/clinic. If you do not have a mask, one will be given to you upon arrival. For doctor visits, patients may have 1 support person aged 1 or older with them. For treatment visits, patients cannot have anyone with them due to current Covid guidelines and our immunocompromised population.

## 2021-03-13 LAB — CANCER ANTIGEN 27.29: CA 27.29: 32.3 U/mL (ref 0.0–38.6)

## 2021-03-24 NOTE — Progress Notes (Signed)
[facility]  Telephone:([phone] [phone]     ID: Veronica [name] DOB: [birth year]  MR#: [id number]  OFH#:[id number]  Patient Care Team: Jamie Kato as PCP - General (Family Medicine) Mauro Kaufmann, RN as Oncology Nurse Navigator [name], RN as Oncology Nurse Navigator Erroll Luna, MD as Consulting Physician (General Surgery) Allien Melberg, Virgie Dad, MD as Consulting Physician (Oncology) Kyung Rudd, MD as Consulting Physician (Radiation Oncology) Gaye Pollack, MD as Consulting Physician (Cardiothoracic Surgery) Serafina Mitchell, MD as Consulting Physician (Vascular Surgery) Benedict Needy, MD as Referring Physician (Hematology and Oncology) Aurea Graff OTHER MD:  CHIEF COMPLAINT: Estrogen receptor positive breast cancer  CURRENT TREATMENT: Paclitaxel day 1 and day 8 of each 21-day cycle, pembrolizumab given with day 8 of each cycle; zoledronate every 12 weeks   INTERVAL HISTORY: Veronica [name] returns today for follow-up and treatment of her now metastatic breast cancer.    She started paclitaxel, given on days 1 and 8, every 21 days, on 11/20/2020.  She started pembrolizumab on 12/17/2020, and it will be continued every 3 weeks on the day 8 of each cycle.   She is having a good response of her measurable disease in the liver. As such, she is continuing treatment and will undergo repeat liver MRI after cycle 8. Today is day 1 cycle 7 of her chemotherapy.  Her last zoledronate was 03/04/2021.  Next dose will be due late August  We are following her CA 27-29 which showing a very encouraging trend: Lab Results  Component Value Date   CA2729 32.3 03/12/2021   CA2729 44.2 (H) 03/04/2021   CA2729 29.8 02/18/2021   CA2729 42.0 (H) 01/28/2021   CA2729 42.8 (H) 01/07/2021   We are following her TSH. She was started on synthroid at her last visit on 03/04/21. Lab Results  Component Value Date   TSH 26.522 (H) 03/12/2021    TSH 11.424 (H) 03/04/2021   TSH 0.103 (L) 02/18/2021   TSH <0.080 (L) 01/28/2021   TSH 2.628 01/07/2021    REVIEW OF SYSTEMS: Veronica [name] spent a week at [employer] with family.  She enjoyed it quite a bit, did a lot of walking.  When she was out in the heat her legs swelled a little.  This does not occur when she is inside in cooler temperature.  She has good energy, no constipation, and no other symptoms related to p her low thyroid studies.  She is tolerating Synthroid at 25 mcg daily with no side effects that she is aware of.  There is no peripheral neuropathy.  A detailed review of systems today was otherwise stable.   COVID 19 VACCINATION STATUS: refuses vaccination, had COVID 29 September 2019   HISTORY OF CURRENT ILLNESS: From the original intake note:  Veronica [name] had routine screening mammography on 04/27/2019 showing a possible abnormality in the right breast. She underwent right diagnostic mammography with tomography and right breast ultrasonography at [facility] on 05/01/2019 showing: breast density category C; highly suspicious 1.7 cm irregular mass in the right breast at 10 o'clock, 8 cm from the nipple; indeterminate hypoechoic round mass in the right axillary tail/low axilla. Physical exam showed no suspicious lumps.  Accordingly on 05/02/2019 she proceeded to biopsy of the right breast area in question. The pathology from this procedure ([id number]) showed: invasive ductal carcinoma, grade 3; ductal carcinoma in situ; lymphovascular invasion present. Prognostic indicators significant for: estrogen receptor, 30% positive with moderate staining  intensity, and progesterone receptor, 30% positive with strong staining intensity. Proliferation marker Ki67 at 20%. HER2 negative by immunohistochemistry (1+).  The second "satellite" lesion was a lymph node which was also positive.  The patient's subsequent history is as detailed below.   PAST MEDICAL HISTORY: Past Medical History:   Diagnosis Date   Cancer ([place]) 2020   breast Cancer, 2022 liver mets   Chronic low back pain with left-sided sciatica    Family history of leukemia    Family history of stomach cancer    Hypertension    Personal history of chemotherapy    Personal history of radiation therapy    finished june '21    PAST SURGICAL HISTORY: Past Surgical History:  Procedure Laterality Date   APPLICATION OF WOUND VAC N/A 10/16/2019   Procedure: APPLICATION OF WOUND VAC;  Surgeon: Gaye Pollack, MD;  Location: [facility];  Service: Vascular;  Laterality: N/A;   BREAST BIOPSY Right 05/02/2019   right clips X2   BREAST LUMPECTOMY Right    06/2019   BREAST LUMPECTOMY WITH RADIOACTIVE SEED AND SENTINEL LYMPH NODE BIOPSY Right 06/12/2019   Procedure: RIGHT BREAST RADIOACTIVE SEED LUMPECTOMY  AND RIGHT AXILLARY SEED TARGETED LYMPH NODE AND AXILLARY SENTINEL LYMPH NODE [place];  Surgeon: Erroll Luna, MD;  Location: [facility];  Service: General;  Laterality: Right;  PEC BLOCK   C/S x2  '98 '[place]  10/02/2019   Procedure: Insertion Central Line Adult;  Surgeon: Serafina Mitchell, MD;  Location: [facility];  Service: Vascular;;   CESAREAN SECTION     CHOLECYSTECTOMY     I & D EXTREMITY N/A 10/16/2019   Procedure: DEBRIDEMENT of DEHISCED MIDLINE SURGICAL INCISION;  Surgeon: Gaye Pollack, MD;  Location: [facility];  Service: Vascular;  Laterality: N/A;   IR IMAGING GUIDED PORT INSERTION  11/24/2020   LAPAROSCOPIC ASSISTED VAGINAL HYSTERECTOMY  05/17/2011   Procedure: LAPAROSCOPIC ASSISTED VAGINAL HYSTERECTOMY;  Surgeon: Sharene Butters;  Location: [facility];  Service: Gynecology;  Laterality: N/A;  Laparoscopic Assisted Vaginal Hysterectomy With Lysis Of Adhesions   PERICARDIAL WINDOW N/A 10/02/2019   Procedure: Pericardial Window;  Surgeon: Serafina Mitchell, MD;  Location: [facility];  Service: Vascular;  Laterality: N/A;   PORT-A-CATH REMOVAL  10/02/2019   Procedure: Removal  Port-A-Cath;  Surgeon: Serafina Mitchell, MD;  Location: [facility];  Service: Vascular;;   PORTACATH PLACEMENT Right 06/12/2019   Procedure: INSERTION PORT-A-CATH WITH ULTRASOUND;  Surgeon: Erroll Luna, MD;  Location: [facility];  Service: General;  Laterality: Right;   RE-EXCISION OF BREAST LUMPECTOMY Right 07/17/2019   Procedure: RE-EXCISION OF RIGHT BREAST LUMPECTOMY;  Surgeon: Erroll Luna, MD;  Location: [facility];  Service: General;  Laterality: Right;   TUBAL LIGATION     ULTRASOUND GUIDANCE FOR VASCULAR ACCESS  10/02/2019   Procedure: Ultrasound Guidance For Vascular Access;  Surgeon: Serafina Mitchell, MD;  Location[facility];  Service: Vascular;;   VENOGRAM N/A 10/02/2019   Procedure: Central VENOGRAM;  Surgeon: Serafina Mitchell, MD;  Location: [facility] OR;  Service: Vascular;  Laterality: N/A;    FAMILY HISTORY: Family History  Problem Relation Age of Onset   Arthritis Mother    COPD Mother    Hypertension Mother    Hyperlipidemia Mother    Leukemia Brother 14   Stomach cancer Maternal Grandmother        late 73s  Patient's parents are living (as of September  2020). Her father is 28 and her mother is 69 as of 04/2019. The patient denies a family hx of breast or ovarian cancer. She has 7 siblings, 6 brothers and 1 sister. She reports her maternal grandmother was diagnosed with stomach cancer at age 43. Her brother was diagnosed with leukemia at age 4.   GYNECOLOGIC HISTORY:  No LMP recorded. Patient has had a hysterectomy. Menarche: 50 years old Age at first live birth: 50 years old GX P 2 (+1 stillborn) LMP age 45-42 Contraceptive: unsure, maybe for a year at age 69. HRT no Hysterectomy? Yes, 05/2011 BSO? 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 She is married. Husband Sheppard Plumber") is a Hydrologist for a telephone company. She lives at home with her husband and two daughters. Daughter Suszanne Conners,  age 27 is a Scientist, water quality. Daughter Jarrett Soho, age 34, is a Ship broker.  She graduated from high school June 2197 and has a Actor, hoping to become an Therapist, sports    ADVANCED DIRECTIVES: Husband Heath Lark is her HCPOA.   HEALTH MAINTENANCE: Social History   Tobacco Use   Smoking status: Never   Smokeless tobacco: Never  Vaping Use   Vaping Use: Never used  Substance Use Topics   Alcohol use: Not Currently    Alcohol/week: 2.0 standard drinks    Types: 2 Standard drinks or equivalent per week    Comment: "2-3 a week"   Drug use: No     Colonoscopy: n/a  PAP: 02/2018  Bone density: n/a   No Known Allergies  Current Outpatient Medications  Medication Sig Dispense Refill   Cholecalciferol (VITAMIN D3) 25 MCG (1000 UT) CHEW Chew 1 tablet by mouth daily.      doxycycline (VIBRA-TABS) 100 MG tablet Take 1 tablet (100 mg total) by mouth daily. 30 tablet 4   gabapentin (NEURONTIN) 300 MG capsule Take 1 capsule (300 mg total) by mouth at bedtime as needed. 90 capsule 4   levothyroxine (SYNTHROID) 25 MCG tablet Take 1 tablet (25 mcg total) by mouth daily before breakfast. 60 tablet 2   metoprolol tartrate (LOPRESSOR) 25 MG tablet Take 1 tablet (25 mg total) by mouth 2 (two) times daily. 60 tablet 0   ondansetron (ZOFRAN) 8 MG tablet Take 1 tablet (8 mg total) by mouth 2 (two) times daily as needed (Nausea or vomiting). 30 tablet 1   prochlorperazine (COMPAZINE) 10 MG tablet Take 1 tablet (10 mg total) by mouth every 6 (six) hours as needed (Nausea or vomiting). 90 tablet 1   rivaroxaban (XARELTO) 20 MG TABS tablet Take 1 tablet (20 mg total) by mouth daily with supper. 90 tablet 3   No current facility-administered medications for this visit.   Facility-Administered Medications Ordered in Other Visits  Medication Dose Route Frequency Provider Last Rate Last Admin   sodium chloride flush (NS) 0.9 % injection 10 mL  10 mL Intravenous PRN Quorra Rosene, Virgie Dad, MD   10 mL at 03/04/21 0935    OBJECTIVE:  white woman who appears stated age  There were no vitals filed for this visit.  Wt Readings from Last 3 Encounters:  03/12/21 227 lb 8 oz (103.2 kg)  03/04/21 225 lb 6.4 oz (102.2 kg)  02/18/21 224 lb 1.9 oz (101.7 kg)   There is no height or weight on file to calculate BMI.    ECOG FS:1 - Symptomatic but completely ambulatory  Sclerae unicteric, EOMs intact Wearing a mask No cervical or supraclavicular  adenopathy Lungs no rales or rhonchi Heart regular rate and rhythm Abd soft, nontender, positive bowel sounds MSK no focal spinal tenderness, no upper extremity lymphedema Neuro: nonfocal, well oriented, appropriate affect Breasts: Deferred   LAB RESULTS:  CMP     Component Value Date/Time   NA 140 03/12/2021 0737   K 3.9 03/12/2021 0737   CL 107 03/12/2021 0737   CO2 21 (L) 03/12/2021 0737   GLUCOSE 179 (H) 03/12/2021 0737   BUN 10 03/12/2021 0737   CREATININE 0.72 03/12/2021 0737   CREATININE 0.75 04/24/2020 1035   CALCIUM 9.0 03/12/2021 0737   PROT 6.2 (L) 03/12/2021 0737   ALBUMIN 3.5 03/12/2021 0737   AST 17 03/12/2021 0737   AST 23 04/24/2020 1035   ALT 19 03/12/2021 0737   ALT 20 04/24/2020 1035   ALKPHOS 76 03/12/2021 0737   BILITOT 0.7 03/12/2021 0737   BILITOT 1.1 04/24/2020 1035   GFRNONAA >60 03/12/2021 0737   GFRNONAA >60 04/24/2020 1035   GFRAA >60 04/24/2020 1035    No results found for: TOTALPROTELP, ALBUMINELP, A1GS, A2GS, BETS, BETA2SER, GAMS, MSPIKE, SPEI  No results found for: KPAFRELGTCHN, LAMBDASER, KAPLAMBRATIO  Lab Results  Component Value Date   WBC 5.5 03/12/2021   NEUTROABS 3.9 03/12/2021   HGB 12.1 03/12/2021   HCT 37.1 03/12/2021   MCV 85.1 03/12/2021   PLT 258 03/12/2021   No results found for: LABCA2  No components found for: KZSWFU932  No results for input(s): INR in the last 168 hours.  No results found for: LABCA2  No results found for: TFT732  No results found for: CAN125  No results found for:  KGU542  Lab Results  Component Value Date   CA2729 32.3 03/12/2021    No components found for: HGQUANT  Lab Results  Component Value Date   CEA1 2.45 10/29/2020   /  CEA ([facility])  Date Value Ref Range Status  10/29/2020 2.45 0.00 - 5.00 ng/mL Final    Comment:    (NOTE) This test was performed using Architect's Chemiluminescent Microparticle Immunoassay. Values obtained from different assay methods cannot be used interchangeably. Please note that 5-10% of patients who smoke may see CEA levels up to 6.9 ng/mL. Performed at [facility], [place] [address], [place], [place] [address]      No results found for: AFPTUMOR  No results found for: [place]  No results found for: HGBA, HGBA2QUANT, HGBFQUANT, HGBSQUAN (Hemoglobinopathy evaluation)   Lab Results  Component Value Date   LDH 170 09/30/2019    No results found for: IRON, TIBC, IRONPCTSAT (Iron and TIBC)  Lab Results  Component Value Date   FERRITIN 498 (H) 10/19/2019    Urinalysis    Component Value Date/Time   COLORURINE YELLOW 08/28/2019 2326   APPEARANCEUR CLOUDY (A) 08/28/2019 2326   LABSPEC 1.013 08/28/2019 2326   PHURINE 5.0 08/28/2019 2326   GLUCOSEU NEGATIVE 08/28/2019 2326   HGBUR MODERATE (A) 08/28/2019 2326   BILIRUBINUR NEGATIVE 08/28/2019 2326   [place] 08/28/2019 2326   PROTEINUR NEGATIVE 08/28/2019 2326   NITRITE NEGATIVE 08/28/2019 2326   LEUKOCYTESUR MODERATE (A) 08/28/2019 2326    STUDIES: No results found.   ELIGIBLE FOR AVAILABLE RESEARCH PROTOCOL: AET  ASSESSMENT: 50 y.o. [place], [place] woman status post right breast upper outer quadrant biopsy 05/01/2019 for a clinical T1c N1, stage IIA invasive ductal carcinoma, grade 3, estrogen and progesterone receptor positive, HER-2 nonamplified, with an MIB-1-1 of 20%.  (a) mass in  the axillary tail was a positive lymph node  (1) MammaPrint obtained from the original biopsy shows a high  risk luminal B tumor and predicts a 5-year disease-free survival of 91% in her case  (2) genetics testing 05/09/2019 through the Common Hereditary Gene Panel offered by [facility] found no deleterious mutations in APC, ATM, AXIN2, BARD1, BMPR1A, BRCA1, BRCA2, BRIP1, CDH1, CDK4, CDKN2A (p14ARF), CDKN2A (p16INK4a), CHEK2, CTNNA1, DICER1, EPCAM (Deletion/duplication testing only), GREM1 (promoter region deletion/duplication testing only), KIT, MEN1, MLH1, MSH2, MSH3, MSH6, MUTYH, NBN, NF1, NHTL1, PALB2, PDGFRA, PMS2, POLD1, POLE, PTEN, RAD50, RAD51C, RAD51D, RNF43, SDHB, SDHC, SDHD, SMAD4, SMARCA4. STK11, TP53, TSC1, TSC2, and VHL.  The following genes were evaluated for sequence changes only: SDHA and HOXB13 c.251G>A variant only.   (a) A variant of uncertain significance (VUS) was detected in one of her MSH6 genes (c.831A>C).   (3) status post right lumpectomy and sentinel lymph node sampling 06/12/2019 for a pT2 pN1, stage IIA invasive ductal carcinoma, grade 2, with positive margins  (a) a total of 4 sentinel lymph nodes removed, one positive (with ECE), ine itc  (b) margin clearance 04/19/2019 successful (medial margin close but negative for DCIS)   (4) adjuvant chemotherapy consisting of doxorubicin and cyclophosphamide in dose dense fashion x4 starting 07/10/2019, completed 09/24/2019; planned weekly paclitaxel x12 omitted   (a) echo 06/26/2019 shows an EF of 60-65%  (b echo on 09/19/2019 shows an EF of 60-65%  (c) chemotherapy stopped after four cycles of doxorubicin and cyclophosphamide due to #5 below  (5) Multiple VTE/PE documented 10/02/2019, on intravenous heparin initially, then Xarelto started 10/19/2019  (a) presented with SVC syndrome and bilateral pulmonary emboli on 09/28/2019  (b) s/p SVC thrombectomy and port removal 16/55/3748 complicated by hemopericardium and tamponade, necessitating pericardial window placement   (c) postop course complicated by wound dehiscence requiring wound  VAC  (c) Doppler 10/03/2019 showed right lower extremity DVT  (d) CT angio and Doppler of both upper extremities 10/13/2019 found persistent acute bilateral pulmonary emboli, persistent but improved SVC thrombus, bilateral pleural effusions, and right lower lobe collapse. Bilateral upper extremity DVTs also noted  (e) Dopplers upper extremity 11/19/2019 showed clots cleared on the left, chronic DVT right internal jugular and axillary veins  (f) CT angio of the chest 10/07/2020 shows resolution of earlier pleural effusions and no evidence of active embolism (but see #9 below)  (6) COVID-19 infection documented 09/27/2020, status post remdesivir   (7) adjuvant radiation 12/24/19 - 02/06/20  Site/dose:   The patient initially received a dose of 50.4 Gy in 28 fractions to the breast and SCLV region using a 4-field approach. This was delivered using a 3-D conformal technique. The patient then received a boost to the seroma. This delivered an additional 10 Gy in 5 fractions using a 3-field photon boost technique. The total dose was 60.4 Gy.   (8) anastrozole started mid May 2021  (a) [facility] and estradiol obtained 11/15/2019 consistent with menopause  (b) not a good candidate for tamoxifen given history of DVT/PE above  (c) bone density 07/31/2020 normal (T score equals 0.1).  METASTATIC DISEASE: Jan 2022 (9) CT angio of the chest 12/28/2021shows new liver lesions (not seen on CT angio January 2021)  (a) MRI 10/22/2020 shows multiple liver lesions, the largest 2.5 cm  (b)   liver biopsy 11/03/2020 confirms metastatic carcinoma estrogen receptor 10% positive with weak staining intensity, HER-2 and progesterone receptor negative.  (c) bone scan 11/25/2020 shows left femoral lesion as well as other lesions   (  i) left hip films not suggestive of impending pathologic fracture  (d) non-contrast head CT 11/25/2020 negative except for sinusitis  (e) CA 27-29 is informative (was 104.1 on 10/29/2020)  (10)  foundation 1 study requested on 11/03/2020 liver biopsy dated 11/03/2020 shows PI K3 CA amplification, and a T p53 mutation.  There were no mutations in BRCA1 and BRCA2 or HER-2.  There is amplification of PRK CI, S0X2, T ERC, and FGF 1 2.  The microsatellite status is stable.  The tumor mutational burden is 4 mutations per Mb  (11) started paclitaxel 11/19/2018 2012, repeated day 1 day 8 of every 21-day cycle  (a) pembrolizumab added 12/17/2020, repeated every 21 days  (b) MRI liver 01/21/2021 (after 3 cycles) shows significant response  (12) zoledronate added 12/10/2020, repeated every 12 weeks    PLAN:  Veronica [name] will start her seventh of 8 planned cycles of chemotherapy today.  She is tolerating treatment well and I am making no changes in her doses or dosing intervals.  Her TSH went up further and I am increasing her Synthroid to 50 mcg daily.  We will continue to follow this closely.  I am entering an order for her repeat liver MRI to be done after her eighth cycle before we meet For consideration of continuing the same treatment or switching to a different treatment or observation  She knows to call for any other issue admittable before the next visit  Total encounter time 25 minutes.Sarajane Jews C. Nayla Dias, MD Medical Oncology and Hematology [facility] [place], [place] [address] Tel. [phone]    Fax. [phone]   I, Wilburn Mylar, am acting as scribe for Dr. Virgie Dad. Daegan Arizmendi.  I, Lurline Del MD, have reviewed the above documentation for accuracy and completeness, and I agree with the above.   *Total Encounter Time as defined by the Centers for Medicare and Medicaid Services includes, in addition to the face-to-face time of a patient visit (documented in the note above) non-face-to-face time: obtaining and reviewing outside history, ordering and reviewing medications, tests or procedures, care coordination (communications with other  health care professionals or caregivers) and documentation in the medical record.

## 2021-03-25 ENCOUNTER — Other Ambulatory Visit: Payer: Self-pay

## 2021-03-25 ENCOUNTER — Other Ambulatory Visit: Payer: 59

## 2021-03-25 ENCOUNTER — Inpatient Hospital Stay: Payer: 59

## 2021-03-25 ENCOUNTER — Inpatient Hospital Stay (HOSPITAL_BASED_OUTPATIENT_CLINIC_OR_DEPARTMENT_OTHER): Payer: 59 | Admitting: Oncology

## 2021-03-25 ENCOUNTER — Other Ambulatory Visit: Payer: Self-pay | Admitting: Oncology

## 2021-03-25 VITALS — BP 124/72 | HR 66 | Temp 97.4°F | Resp 18 | Ht 61.0 in | Wt 232.4 lb

## 2021-03-25 DIAGNOSIS — Z170 Estrogen receptor positive status [ER+]: Secondary | ICD-10-CM | POA: Diagnosis not present

## 2021-03-25 DIAGNOSIS — Z7189 Other specified counseling: Secondary | ICD-10-CM

## 2021-03-25 DIAGNOSIS — Z95828 Presence of other vascular implants and grafts: Secondary | ICD-10-CM

## 2021-03-25 DIAGNOSIS — Z5112 Encounter for antineoplastic immunotherapy: Secondary | ICD-10-CM | POA: Diagnosis not present

## 2021-03-25 DIAGNOSIS — C50411 Malignant neoplasm of upper-outer quadrant of right female breast: Secondary | ICD-10-CM | POA: Diagnosis not present

## 2021-03-25 DIAGNOSIS — C787 Secondary malignant neoplasm of liver and intrahepatic bile duct: Secondary | ICD-10-CM

## 2021-03-25 DIAGNOSIS — E039 Hypothyroidism, unspecified: Secondary | ICD-10-CM

## 2021-03-25 LAB — COMPREHENSIVE METABOLIC PANEL
ALT: 24 U/L (ref 0–44)
AST: 24 U/L (ref 15–41)
Albumin: 3.4 g/dL — ABNORMAL LOW (ref 3.5–5.0)
Alkaline Phosphatase: 78 U/L (ref 38–126)
Anion gap: 9 (ref 5–15)
BUN: 16 mg/dL (ref 6–20)
CO2: 22 mmol/L (ref 22–32)
Calcium: 9.6 mg/dL (ref 8.9–10.3)
Chloride: 108 mmol/L (ref 98–111)
Creatinine, Ser: 0.8 mg/dL (ref 0.44–1.00)
GFR, Estimated: >60 mL/min (ref >60)
Glucose, Bld: 166 mg/dL — ABNORMAL HIGH (ref 70–99)
Potassium: 4.1 mmol/L (ref 3.5–5.1)
Sodium: 139 mmol/L (ref 135–145)
Total Bilirubin: 0.8 mg/dL (ref 0.3–1.2)
Total Protein: 6.2 g/dL — ABNORMAL LOW (ref 6.5–8.1)

## 2021-03-25 LAB — CBC WITH DIFFERENTIAL/PLATELET
Abs Immature Granulocytes: 0.06 10*3/uL (ref 0.00–0.07)
Basophils Absolute: 0 10*3/uL (ref 0.0–0.1)
Basophils Relative: 1 %
Eosinophils Absolute: 0.1 10*3/uL (ref 0.0–0.5)
Eosinophils Relative: 3 %
HCT: 36.8 % (ref 36.0–46.0)
Hemoglobin: 12.3 g/dL (ref 12.0–15.0)
Immature Granulocytes: 2 %
Lymphocytes Relative: 23 %
Lymphs Abs: 0.9 10*3/uL (ref 0.7–4.0)
MCH: 28.2 pg (ref 26.0–34.0)
MCHC: 33.4 g/dL (ref 30.0–36.0)
MCV: 84.4 fL (ref 80.0–100.0)
Monocytes Absolute: 0.5 10*3/uL (ref 0.1–1.0)
Monocytes Relative: 14 %
Neutro Abs: 2.3 10*3/uL (ref 1.7–7.7)
Neutrophils Relative %: 57 %
Platelets: 237 10*3/uL (ref 150–400)
RBC: 4.36 MIL/uL (ref 3.87–5.11)
RDW: 16 % — ABNORMAL HIGH (ref 11.5–15.5)
WBC: 4 10*3/uL (ref 4.0–10.5)
nRBC: 0 % (ref 0.0–0.2)

## 2021-03-25 LAB — TSH: TSH: 32.685 u[IU]/mL — ABNORMAL HIGH (ref 0.308–3.960)

## 2021-03-25 MED ORDER — SODIUM CHLORIDE 0.9 % IV SOLN
Freq: Once | INTRAVENOUS | Status: AC
Start: 2021-03-25 — End: 2021-03-25
  Filled 2021-03-25: qty 250

## 2021-03-25 MED ORDER — SODIUM CHLORIDE 0.9 % IV SOLN
90.0000 mg/m2 | Freq: Once | INTRAVENOUS | Status: AC
  Administered 2021-03-25: 186 mg via INTRAVENOUS
  Filled 2021-03-25: qty 31

## 2021-03-25 MED ORDER — DIPHENHYDRAMINE HCL 50 MG/ML IJ SOLN
INTRAMUSCULAR | Status: AC
  Filled 2021-03-25: qty 1

## 2021-03-25 MED ORDER — FAMOTIDINE 20 MG IN NS 100 ML IVPB
INTRAVENOUS | Status: AC
  Filled 2021-03-25: qty 100

## 2021-03-25 MED ORDER — SODIUM CHLORIDE 0.9% FLUSH
10.0000 mL | INTRAVENOUS | Status: DC | PRN
Start: 2021-03-25 — End: 2021-03-25
  Administered 2021-03-25: 10 mL
  Filled 2021-03-25: qty 10

## 2021-03-25 MED ORDER — FAMOTIDINE 20 MG IN NS 100 ML IVPB
20.0000 mg | Freq: Once | INTRAVENOUS | Status: AC
Start: 2021-03-25 — End: 2021-03-25
  Administered 2021-03-25: 20 mg via INTRAVENOUS

## 2021-03-25 MED ORDER — LEVOTHYROXINE SODIUM 50 MCG PO TABS: 50.0000 ug | ORAL_TABLET | Freq: Every day | ORAL | 4 refills | Status: DC

## 2021-03-25 MED ORDER — DIPHENHYDRAMINE HCL 50 MG/ML IJ SOLN
25.0000 mg | Freq: Once | INTRAMUSCULAR | Status: AC
  Administered 2021-03-25: 25 mg via INTRAVENOUS

## 2021-03-25 MED ORDER — DEXAMETHASONE SODIUM PHOSPHATE 10 MG/ML IJ SOLN
INTRAMUSCULAR | Status: AC
  Filled 2021-03-25: qty 1

## 2021-03-25 MED ORDER — DEXAMETHASONE SODIUM PHOSPHATE 10 MG/ML IJ SOLN
4.0000 mg | Freq: Once | INTRAMUSCULAR | Status: AC
  Administered 2021-03-25: 4 mg via INTRAVENOUS

## 2021-03-25 MED ORDER — HEPARIN SOD (PORK) LOCK FLUSH 100 UNIT/ML IV SOLN
500.0000 [IU] | Freq: Once | INTRAVENOUS | Status: AC | PRN
  Administered 2021-03-25: 500 [IU]
  Filled 2021-03-25: qty 5

## 2021-03-25 MED ORDER — SODIUM CHLORIDE 0.9% FLUSH
10.0000 mL | INTRAVENOUS | Status: AC | PRN
  Administered 2021-03-25: 10 mL
  Filled 2021-03-25: qty 10

## 2021-03-25 NOTE — Patient Instructions (Signed)
[facility]   Discharge Instructions: Thank you for choosing [facility] to provide your oncology and hematology care.   If you have a lab appointment with the [facility], please go directly to the [facility] and check in at the registration area.   Wear comfortable clothing and clothing appropriate for easy access to any Portacath or PICC line.   We strive to give you quality time with your provider. You may need to reschedule your appointment if you arrive late (15 or more minutes).  Arriving late affects you and other patients whose appointments are after yours.  Also, if you miss three or more appointments without notifying the office, you may be dismissed from the clinic at the provider's discretion.      For prescription refill requests, have your pharmacy contact our office and allow 72 hours for refills to be completed.    Today you received the following chemotherapy and/or immunotherapy agents: Paclitaxel (Taxol)      To help prevent nausea and vomiting after your treatment, we encourage you to take your nausea medication as directed.  BELOW ARE SYMPTOMS THAT SHOULD BE REPORTED IMMEDIATELY: *FEVER GREATER THAN 100.4 F (38 C) OR HIGHER *CHILLS OR SWEATING *NAUSEA AND VOMITING THAT IS NOT CONTROLLED WITH YOUR NAUSEA MEDICATION *UNUSUAL SHORTNESS OF BREATH *UNUSUAL BRUISING OR BLEEDING *URINARY PROBLEMS (pain or burning when urinating, or frequent urination) *BOWEL PROBLEMS (unusual diarrhea, constipation, pain near the anus) TENDERNESS IN MOUTH AND THROAT WITH OR WITHOUT PRESENCE OF ULCERS (sore throat, sores in mouth, or a toothache) UNUSUAL RASH, SWELLING OR PAIN  UNUSUAL VAGINAL DISCHARGE OR ITCHING   Items with * indicate a potential emergency and should be followed up as soon as possible or go to the Emergency Department if any problems should occur.  Please show the CHEMOTHERAPY ALERT CARD or IMMUNOTHERAPY ALERT CARD at  check-in to the Emergency Department and triage nurse.  Should you have questions after your visit or need to cancel or reschedule your appointment, please contact [facility]  Dept: [phone]  and follow the prompts.  Office hours are 8:00 a.m. to 4:30 p.m. Monday - Friday. Please note that voicemails left after 4:00 p.m. may not be returned until the following business day.  We are closed weekends and major holidays. You have access to a nurse at all times for urgent questions. Please call the main number to the clinic Dept: [phone] and follow the prompts.   For any non-urgent questions, you may also contact your provider using MyChart. We now offer e-Visits for anyone 27 and older to request care online for non-urgent symptoms. For details visit [email or web address].   Also download the MyChart app! Go to the app store, search "MyChart", open the app, select [facility], and log in with your MyChart username and password.  Due to Covid, a mask is required upon entering the hospital/clinic. If you do not have a mask, one will be given to you upon arrival. For doctor visits, patients may have 1 support person aged 46 or older with them. For treatment visits, patients cannot have anyone with them due to current Covid guidelines and our immunocompromised population.

## 2021-03-26 LAB — CANCER ANTIGEN 27.29: CA 27.29: 32.5 U/mL (ref 0.0–38.6)

## 2021-04-01 ENCOUNTER — Other Ambulatory Visit: Payer: Self-pay

## 2021-04-01 ENCOUNTER — Inpatient Hospital Stay: Payer: 59

## 2021-04-01 VITALS — BP 123/80 | HR 70 | Temp 98.0°F | Resp 18 | Wt 230.4 lb

## 2021-04-01 DIAGNOSIS — C50411 Malignant neoplasm of upper-outer quadrant of right female breast: Secondary | ICD-10-CM

## 2021-04-01 DIAGNOSIS — Z95828 Presence of other vascular implants and grafts: Secondary | ICD-10-CM

## 2021-04-01 DIAGNOSIS — Z5112 Encounter for antineoplastic immunotherapy: Secondary | ICD-10-CM | POA: Diagnosis not present

## 2021-04-01 DIAGNOSIS — C787 Secondary malignant neoplasm of liver and intrahepatic bile duct: Secondary | ICD-10-CM

## 2021-04-01 LAB — COMPREHENSIVE METABOLIC PANEL
ALT: 26 U/L (ref 0–44)
AST: 21 U/L (ref 15–41)
Albumin: 3.6 g/dL (ref 3.5–5.0)
Alkaline Phosphatase: 76 U/L (ref 38–126)
Anion gap: 9 (ref 5–15)
BUN: 15 mg/dL (ref 6–20)
CO2: 22 mmol/L (ref 22–32)
Calcium: 9.3 mg/dL (ref 8.9–10.3)
Chloride: 106 mmol/L (ref 98–111)
Creatinine, Ser: 0.75 mg/dL (ref 0.44–1.00)
GFR, Estimated: >60 mL/min (ref >60)
Glucose, Bld: 175 mg/dL — ABNORMAL HIGH (ref 70–99)
Potassium: 4.4 mmol/L (ref 3.5–5.1)
Sodium: 137 mmol/L (ref 135–145)
Total Bilirubin: 0.9 mg/dL (ref 0.3–1.2)
Total Protein: 6.4 g/dL — ABNORMAL LOW (ref 6.5–8.1)

## 2021-04-01 LAB — CBC WITH DIFFERENTIAL/PLATELET
Abs Immature Granulocytes: 0.08 10*3/uL — ABNORMAL HIGH (ref 0.00–0.07)
Basophils Absolute: 0.1 10*3/uL (ref 0.0–0.1)
Basophils Relative: 1 %
Eosinophils Absolute: 0.1 10*3/uL (ref 0.0–0.5)
Eosinophils Relative: 3 %
HCT: 36.7 % (ref 36.0–46.0)
Hemoglobin: 12.2 g/dL (ref 12.0–15.0)
Immature Granulocytes: 2 %
Lymphocytes Relative: 20 %
Lymphs Abs: 0.8 10*3/uL (ref 0.7–4.0)
MCH: 27.8 pg (ref 26.0–34.0)
MCHC: 33.2 g/dL (ref 30.0–36.0)
MCV: 83.6 fL (ref 80.0–100.0)
Monocytes Absolute: 0.2 10*3/uL (ref 0.1–1.0)
Monocytes Relative: 4 %
Neutro Abs: 3 10*3/uL (ref 1.7–7.7)
Neutrophils Relative %: 70 %
Platelets: 221 10*3/uL (ref 150–400)
RBC: 4.39 MIL/uL (ref 3.87–5.11)
RDW: 15.3 % (ref 11.5–15.5)
WBC: 4.2 10*3/uL (ref 4.0–10.5)
nRBC: 0 % (ref 0.0–0.2)

## 2021-04-01 MED ORDER — SODIUM CHLORIDE 0.9% FLUSH
10.0000 mL | INTRAVENOUS | Status: DC | PRN
  Administered 2021-04-01: 10 mL
  Filled 2021-04-01: qty 10

## 2021-04-01 MED ORDER — FAMOTIDINE 20 MG IN NS 100 ML IVPB
INTRAVENOUS | Status: AC
  Filled 2021-04-01: qty 100

## 2021-04-01 MED ORDER — DEXAMETHASONE SODIUM PHOSPHATE 10 MG/ML IJ SOLN
INTRAMUSCULAR | Status: AC
  Filled 2021-04-01: qty 1

## 2021-04-01 MED ORDER — SODIUM CHLORIDE 0.9 % IV SOLN
Freq: Once | INTRAVENOUS | Status: AC
Start: 2021-04-01 — End: 2021-04-01
  Filled 2021-04-01: qty 250

## 2021-04-01 MED ORDER — FAMOTIDINE 20 MG IN NS 100 ML IVPB
20.0000 mg | Freq: Once | INTRAVENOUS | Status: AC
  Administered 2021-04-01: 20 mg via INTRAVENOUS

## 2021-04-01 MED ORDER — DIPHENHYDRAMINE HCL 50 MG/ML IJ SOLN
INTRAMUSCULAR | Status: AC
  Filled 2021-04-01: qty 1

## 2021-04-01 MED ORDER — SODIUM CHLORIDE 0.9 % IV SOLN
90.0000 mg/m2 | Freq: Once | INTRAVENOUS | Status: AC
  Administered 2021-04-01: 186 mg via INTRAVENOUS
  Filled 2021-04-01: qty 31

## 2021-04-01 MED ORDER — DIPHENHYDRAMINE HCL 50 MG/ML IJ SOLN
25.0000 mg | Freq: Once | INTRAMUSCULAR | Status: AC
Start: 2021-04-01 — End: 2021-04-01
  Administered 2021-04-01: 25 mg via INTRAVENOUS

## 2021-04-01 MED ORDER — SODIUM CHLORIDE 0.9 % IV SOLN
200.0000 mg | Freq: Once | INTRAVENOUS | Status: AC
  Administered 2021-04-01: 200 mg via INTRAVENOUS
  Filled 2021-04-01: qty 8

## 2021-04-01 MED ORDER — DEXAMETHASONE SODIUM PHOSPHATE 10 MG/ML IJ SOLN
4.0000 mg | Freq: Once | INTRAMUSCULAR | Status: AC
  Administered 2021-04-01: 4 mg via INTRAVENOUS

## 2021-04-01 MED ORDER — HEPARIN SOD (PORK) LOCK FLUSH 100 UNIT/ML IV SOLN
500.0000 [IU] | Freq: Once | INTRAVENOUS | Status: AC | PRN
  Administered 2021-04-01: 500 [IU]
  Filled 2021-04-01: qty 5

## 2021-04-01 NOTE — Patient Instructions (Signed)
[facility]   Discharge Instructions: Thank you for choosing [facility] to provide your oncology and hematology care.   If you have a lab appointment with the [facility], please go directly to the [facility] and check in at the registration area.   Wear comfortable clothing and clothing appropriate for easy access to any Portacath or PICC line.   We strive to give you quality time with your provider. You may need to reschedule your appointment if you arrive late (15 or more minutes).  Arriving late affects you and other patients whose appointments are after yours.  Also, if you miss three or more appointments without notifying the office, you may be dismissed from the clinic at the provider's discretion.      For prescription refill requests, have your pharmacy contact our office and allow 72 hours for refills to be completed.    Today you received the following chemotherapy and/or immunotherapy agents: Paclitaxel (Taxol); Keytruda    To help prevent nausea and vomiting after your treatment, we encourage you to take your nausea medication as directed.  BELOW ARE SYMPTOMS THAT SHOULD BE REPORTED IMMEDIATELY: *FEVER GREATER THAN 100.4 F (38 C) OR HIGHER *CHILLS OR SWEATING *NAUSEA AND VOMITING THAT IS NOT CONTROLLED WITH YOUR NAUSEA MEDICATION *UNUSUAL SHORTNESS OF BREATH *UNUSUAL BRUISING OR BLEEDING *URINARY PROBLEMS (pain or burning when urinating, or frequent urination) *BOWEL PROBLEMS (unusual diarrhea, constipation, pain near the anus) TENDERNESS IN MOUTH AND THROAT WITH OR WITHOUT PRESENCE OF ULCERS (sore throat, sores in mouth, or a toothache) UNUSUAL RASH, SWELLING OR PAIN  UNUSUAL VAGINAL DISCHARGE OR ITCHING   Items with * indicate a potential emergency and should be followed up as soon as possible or go to the Emergency Department if any problems should occur.  Please show the CHEMOTHERAPY ALERT CARD or IMMUNOTHERAPY ALERT  CARD at check-in to the Emergency Department and triage nurse.  Should you have questions after your visit or need to cancel or reschedule your appointment, please contact [facility]  Dept: [phone]  and follow the prompts.  Office hours are 8:00 a.m. to 4:30 p.m. Monday - Friday. Please note that voicemails left after 4:00 p.m. may not be returned until the following business day.  We are closed weekends and major holidays. You have access to a nurse at all times for urgent questions. Please call the main number to the clinic Dept: [phone] and follow the prompts.   For any non-urgent questions, you may also contact your provider using MyChart. We now offer e-Visits for anyone 85 and older to request care online for non-urgent symptoms. For details visit [email or web address].   Also download the MyChart app! Go to the app store, search "MyChart", open the app, select [facility], and log in with your MyChart username and password.  Due to Covid, a mask is required upon entering the hospital/clinic. If you do not have a mask, one will be given to you upon arrival. For doctor visits, patients may have 1 support person aged 59 or older with them. For treatment visits, patients cannot have anyone with them due to current Covid guidelines and our immunocompromised population.

## 2021-04-02 LAB — T4: T4, Total: 4.4 ug/dL — ABNORMAL LOW (ref 4.5–12.0)

## 2021-04-14 NOTE — Progress Notes (Signed)
[place]  Telephone:(336) [phone] Fax:(336) [phone]    ID: Veronica [name] DOB: [birth year]  MR#: [id number]  TXM#:[id number]  Patient Care Team: Veronica [name] as PCP - General (Family Medicine) Veronica Kaufmann, RN as Oncology Nurse Navigator Veronica Germany, RN as Oncology Nurse Navigator Veronica Luna, MD as Consulting Physician (General Surgery) Veronica [name], Virgie Dad, MD as Consulting Physician (Oncology) Veronica Rudd, MD as Consulting Physician (Radiation Oncology) Veronica Pollack, MD as Consulting Physician (Cardiothoracic Surgery) Veronica Mitchell, MD as Consulting Physician (Vascular Surgery) Veronica Needy, MD as Referring Physician (Hematology and Oncology) Veronica Cruel, MD OTHER MD:  CHIEF COMPLAINT: Estrogen receptor positive breast cancer  CURRENT TREATMENT: Paclitaxel day 1 and day 8 of each 21-day cycle, pembrolizumab given with day 8 of each cycle; zoledronate every 12 weeks   INTERVAL HISTORY: Veronica [name] returns today for follow-up and treatment of her now metastatic breast cancer.    She started paclitaxel, given on days 1 and 8, every 21 days, on 11/20/2020.  She started pembrolizumab on 12/17/2020, and it will be continued every 3 weeks on the day 8 of each cycle.   She is having a good response of her measurable disease in the liver. As such, she is continuing treatment and will undergo repeat liver MRI on 04/29/2021. Today is day 1 cycle 8 of her chemotherapy.  Her last zoledronate was 03/04/2021.  Next dose will be due late August  We are following her CA 27-29 which showing a very encouraging trend: Lab Results  Component Value Date   CA2729 32.5 03/25/2021   CA2729 32.3 03/12/2021   CA2729 44.2 (H) 03/04/2021   CA2729 29.8 02/18/2021   CA2729 42.0 (H) 01/28/2021   We are following her TSH. She was started on synthroid at her last visit on 03/04/21. Lab Results  Component Value Date   TSH 32.685 (H) 03/25/2021   TSH  26.522 (H) 03/12/2021   TSH 11.424 (H) 03/04/2021   TSH 0.103 (L) 02/18/2021   TSH <0.080 (L) 01/28/2021    REVIEW OF SYSTEMS: Veronica [name] had a teeny bit of numbness and tingling in the third and fourth finger of her right hand.  This was very brief and has not persisted.  She has no other suggestion of peripheral neuropathy.  She went to [facility] for [employer] and had virus.  She had nausea and vomiting and a little dry cough but no fever.  She has had a little bit of a cough remaining which she notices particularly at bedtime.  She did not get tested for COVID.  No one else in the family was sick.  She walks up and down the road about 3 days a week but it is too hot to do it more frequently she says.  She gets candidal infections here and there and she has run out of the ketoconazole cream which she wanted me to refill.  She has no symptoms related to her hypothyroidism.  Detailed review of systems today was otherwise stable   COVID 19 VACCINATION STATUS: refuses vaccination, had COVID 29 September 2019   HISTORY OF CURRENT ILLNESS: From the original intake note:  Veronica [name] had routine screening mammography on 04/27/2019 showing a possible abnormality in the right breast. She underwent right diagnostic mammography with tomography and right breast ultrasonography at [facility] on 05/01/2019 showing: breast density category C; highly suspicious 1.7 cm irregular mass in the right breast at 10 o'clock, 8 cm from  the nipple; indeterminate hypoechoic round mass in the right axillary tail/low axilla. Physical exam showed no suspicious lumps.  Accordingly on 05/02/2019 she proceeded to biopsy of the right breast area in question. The pathology from this procedure ([id number]) showed: invasive ductal carcinoma, grade 3; ductal carcinoma in situ; lymphovascular invasion present. Prognostic indicators significant for: estrogen receptor, 30% positive with moderate staining intensity, and progesterone  receptor, 30% positive with strong staining intensity. Proliferation marker Ki67 at 20%. HER2 negative by immunohistochemistry (1+).  The second "satellite" lesion was a lymph node which was also positive.  The patient's subsequent history is as detailed below.   PAST MEDICAL HISTORY: Past Medical History:  Diagnosis Date   Cancer ([place]) 2020   breast Cancer, 2022 liver mets   Chronic low back pain with left-sided sciatica    Family history of leukemia    Family history of stomach cancer    Hypertension    Personal history of chemotherapy    Personal history of radiation therapy    finished june '21    PAST SURGICAL HISTORY: Past Surgical History:  Procedure Laterality Date   APPLICATION OF WOUND VAC N/A 10/16/2019   Procedure: APPLICATION OF WOUND VAC;  Surgeon: Veronica Pollack, MD;  Location: [facility];  Service: Vascular;  Laterality: N/A;   BREAST BIOPSY Right 05/02/2019   right clips X2   BREAST LUMPECTOMY Right    06/2019   BREAST LUMPECTOMY WITH RADIOACTIVE SEED AND SENTINEL LYMPH NODE BIOPSY Right 06/12/2019   Procedure: RIGHT BREAST RADIOACTIVE SEED LUMPECTOMY  AND RIGHT AXILLARY SEED TARGETED LYMPH NODE AND AXILLARY SENTINEL LYMPH NODE [place];  Surgeon: Veronica Luna, MD;  Location: [place];  Service: General;  Laterality: Right;  PEC BLOCK   C/S x2  '98 '[place]  10/02/2019   Procedure: Insertion Central Line Adult;  Surgeon: Veronica Mitchell, MD;  Location: [facility];  Service: Vascular;;   CESAREAN SECTION     CHOLECYSTECTOMY     I & D EXTREMITY N/A 10/16/2019   Procedure: DEBRIDEMENT of DEHISCED MIDLINE SURGICAL INCISION;  Surgeon: Veronica Pollack, MD;  Location: [facility];  Service: Vascular;  Laterality: N/A;   IR IMAGING GUIDED PORT INSERTION  11/24/2020   LAPAROSCOPIC ASSISTED VAGINAL HYSTERECTOMY  05/17/2011   Procedure: LAPAROSCOPIC ASSISTED VAGINAL HYSTERECTOMY;  Surgeon: Veronica [name];  Location: [facility];  Service:  Gynecology;  Laterality: N/A;  Laparoscopic Assisted Vaginal Hysterectomy With Lysis Of Adhesions   PERICARDIAL WINDOW N/A 10/02/2019   Procedure: Pericardial Window;  Surgeon: Veronica Mitchell, MD;  Location: [facility];  Service: Vascular;  Laterality: N/A;   PORT-A-CATH REMOVAL  10/02/2019   Procedure: Removal Port-A-Cath;  Surgeon: Veronica Mitchell, MD;  Location: [facility];  Service: Vascular;;   PORTACATH PLACEMENT Right 06/12/2019   Procedure: INSERTION PORT-A-CATH WITH ULTRASOUND;  Surgeon: Veronica Luna, MD;  Location: [place];  Service: General;  Laterality: Right;   RE-EXCISION OF BREAST LUMPECTOMY Right 07/17/2019   Procedure: RE-EXCISION OF RIGHT BREAST LUMPECTOMY;  Surgeon: Veronica Luna, MD;  Location: [facility];  Service: General;  Laterality: Right;   TUBAL LIGATION     ULTRASOUND GUIDANCE FOR VASCULAR ACCESS  10/02/2019   Procedure: Ultrasound Guidance For Vascular Access;  Surgeon: Veronica Mitchell, MD;  [facility];  Service: Vascular;;   VENOGRAM N/A 10/02/2019   Procedure: Central VENOGRAM;  Surgeon: Veronica Mitchell, MD;  Location: [place];  Service: Vascular;  Laterality: N/A;    FAMILY HISTORY: Family History  Problem Relation Age of Onset   Arthritis Mother    COPD Mother    Hypertension Mother    Hyperlipidemia Mother    Leukemia Brother 67   Stomach cancer Maternal Grandmother        late 54s  Patient's parents are living (as of September 2020). Her father is 59 and her mother is 36 as of 04/2019. The patient denies a family hx of breast or ovarian cancer. She has 7 siblings, 6 brothers and 1 sister. She reports her maternal grandmother was diagnosed with stomach cancer at age 59. Her brother was diagnosed with leukemia at age 50.   GYNECOLOGIC HISTORY:  No LMP recorded. Patient has had a hysterectomy. Menarche: 50 years old Age at first live birth: 50 years old GX P 2 (+1 stillborn) LMP age 38-42 Contraceptive: unsure, maybe  for a year at age 48. HRT no Hysterectomy? Yes, 05/2011 BSO? 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 She is married. Husband Sheppard Plumber") is a Hydrologist for a telephone company. She lives at home with her husband and two daughters. Daughter Suszanne Conners, age 21 is a Scientist, water quality. Daughter Jarrett Soho, age 14, is a Ship broker.  She graduated from high school June 4128 and has a Actor, hoping to become an Therapist, sports    ADVANCED DIRECTIVES: Husband Heath Lark is her HCPOA.   HEALTH MAINTENANCE: Social History   Tobacco Use   Smoking status: Never   Smokeless tobacco: Never  Vaping Use   Vaping Use: Never used  Substance Use Topics   Alcohol use: Not Currently    Alcohol/week: 2.0 standard drinks    Types: 2 Standard drinks or equivalent per week    Comment: "2-3 a week"   Drug use: No     Colonoscopy: n/a  PAP: 02/2018  Bone density: n/a   No Known Allergies  Current Outpatient Medications  Medication Sig Dispense Refill   ketoconazole (NIZORAL) 2 % cream Apply 1 application topically daily. 15 g 4   levothyroxine (SYNTHROID) 88 MCG tablet Take 1 tablet (88 mcg total) by mouth daily before breakfast. 30 tablet 2   Cholecalciferol (VITAMIN D3) 25 MCG (1000 UT) CHEW Chew 1 tablet by mouth daily.      levothyroxine (SYNTHROID) 50 MCG tablet Take 1 tablet (50 mcg total) by mouth daily before breakfast. 90 tablet 4   metoprolol tartrate (LOPRESSOR) 25 MG tablet Take 1 tablet (25 mg total) by mouth 2 (two) times daily. 60 tablet 0   ondansetron (ZOFRAN) 8 MG tablet Take 1 tablet (8 mg total) by mouth 2 (two) times daily as needed (Nausea or vomiting). 30 tablet 1   prochlorperazine (COMPAZINE) 10 MG tablet Take 1 tablet (10 mg total) by mouth every 6 (six) hours as needed (Nausea or vomiting). 90 tablet 1   rivaroxaban (XARELTO) 20 MG TABS tablet Take 1 tablet (20 mg total) by mouth daily with supper. 90 tablet 3   No current  facility-administered medications for this visit.   Facility-Administered Medications Ordered in Other Visits  Medication Dose Route Frequency Provider Last Rate Last Admin   sodium chloride flush (NS) 0.9 % injection 10 mL  10 mL Intravenous PRN Sigurd Pugh, Virgie Dad, MD   10 mL at 03/04/21 0935    OBJECTIVE: white woman who appears stated age  74:   04/15/21 0802  BP: 110/68  Pulse: 77  Resp: 18  Temp: Marland Kitchen)  97.3 F (36.3 C)  SpO2: 100%   Wt Readings from Last 3 Encounters:  04/15/21 231 lb 12.8 oz (105.1 kg)  04/01/21 230 lb 7 oz (104.5 kg)  03/25/21 232 lb 6.4 oz (105.4 kg)   Body mass index is 43.8 kg/m.    ECOG FS:1 - Symptomatic but completely ambulatory  Sclerae unicteric, EOMs intact Wearing a mask No cervical or supraclavicular adenopathy Lungs no rales or rhonchi Heart regular rate and rhythm Abd soft, nontender, positive bowel sounds MSK no focal spinal tenderness, no upper extremity lymphedema Neuro: nonfocal, well oriented, appropriate affect Breasts: Deferred   LAB RESULTS:  CMP     Component Value Date/Time   NA 137 04/01/2021 0816   K 4.4 04/01/2021 0816   CL 106 04/01/2021 0816   CO2 22 04/01/2021 0816   GLUCOSE 175 (H) 04/01/2021 0816   BUN 15 04/01/2021 0816   CREATININE 0.75 04/01/2021 0816   CREATININE 0.75 04/24/2020 1035   CALCIUM 9.3 04/01/2021 0816   PROT 6.4 (L) 04/01/2021 0816   ALBUMIN 3.6 04/01/2021 0816   AST 21 04/01/2021 0816   AST 23 04/24/2020 1035   ALT 26 04/01/2021 0816   ALT 20 04/24/2020 1035   ALKPHOS 76 04/01/2021 0816   BILITOT 0.9 04/01/2021 0816   BILITOT 1.1 04/24/2020 1035   GFRNONAA >60 04/01/2021 0816   GFRNONAA >60 04/24/2020 1035   GFRAA >60 04/24/2020 1035    No results found for: TOTALPROTELP, ALBUMINELP, A1GS, A2GS, BETS, BETA2SER, GAMS, MSPIKE, SPEI  No results found for: KPAFRELGTCHN, LAMBDASER, KAPLAMBRATIO  Lab Results  Component Value Date   WBC 4.6 04/15/2021   NEUTROABS PENDING  04/15/2021   HGB 12.4 04/15/2021   HCT 37.3 04/15/2021   MCV 83.8 04/15/2021   PLT 245 04/15/2021   No results found for: LABCA2  No components found for: PQDIYM415  No results for input(s): INR in the last 168 hours.  No results found for: LABCA2  No results found for: CAN199  No results found for: CAN125  No results found for: AXE940  Lab Results  Component Value Date   CA2729 32.5 03/25/2021    No components found for: HGQUANT  Lab Results  Component Value Date   CEA1 2.45 10/29/2020   /  CEA ([facility]In [facility])  Date Value Ref Range Status  10/29/2020 2.45 0.00 - 5.00 ng/mL Final    Comment:    (NOTE) This test was performed using Architect's Chemiluminescent Microparticle Immunoassay. Values obtained from different assay methods cannot be used interchangeably. Please note that 5-10% of patients who smoke may see CEA levels up to 6.9 ng/mL. Performed at [facility], [place] [address] [place], [place] [address]      No results found for: AFPTUMOR  No results found for: [place]  No results found for: HGBA, HGBA2QUANT, HGBFQUANT, HGBSQUAN (Hemoglobinopathy evaluation)   Lab Results  Component Value Date   LDH 170 09/30/2019    No results found for: IRON, TIBC, IRONPCTSAT (Iron and TIBC)  Lab Results  Component Value Date   FERRITIN 498 (H) 10/19/2019    Urinalysis    Component Value Date/Time   COLORURINE YELLOW 08/28/2019 2326   APPEARANCEUR CLOUDY (A) 08/28/2019 2326   LABSPEC 1.013 08/28/2019 2326   PHURINE 5.0 08/28/2019 2326   GLUCOSEU NEGATIVE 08/28/2019 2326   HGBUR MODERATE (A) 08/28/2019 2326   BILIRUBINUR NEGATIVE 08/28/2019 2326   KETONESUR NEGATIVE 08/28/2019 2326   PROTEINUR NEGATIVE 08/28/2019 2326   NITRITE NEGATIVE  08/28/2019 2326   LEUKOCYTESUR MODERATE (A) 08/28/2019 2326    STUDIES: No results found.   ELIGIBLE FOR AVAILABLE RESEARCH PROTOCOL: AET  ASSESSMENT: 50 y.o. [place], [place] woman  status post right breast upper outer quadrant biopsy 05/01/2019 for a clinical T1c N1, stage IIA invasive ductal carcinoma, grade 3, estrogen and progesterone receptor positive, HER-2 nonamplified, with an MIB-1-1 of 20%.  (a) mass in the axillary tail was a positive lymph node  (1) MammaPrint obtained from the original biopsy shows a high risk luminal B tumor and predicts a 5-year disease-free survival of 91% in her case  (2) genetics testing 05/09/2019 through the Common Hereditary Gene Panel offered by [facility] found no deleterious mutations in APC, ATM, AXIN2, BARD1, BMPR1A, BRCA1, BRCA2, BRIP1, CDH1, CDK4, CDKN2A (p14ARF), CDKN2A (p16INK4a), CHEK2, CTNNA1, DICER1, EPCAM (Deletion/duplication testing only), GREM1 (promoter region deletion/duplication testing only), KIT, MEN1, MLH1, MSH2, MSH3, MSH6, MUTYH, NBN, NF1, NHTL1, PALB2, PDGFRA, PMS2, POLD1, POLE, PTEN, RAD50, RAD51C, RAD51D, RNF43, SDHB, SDHC, SDHD, SMAD4, SMARCA4. STK11, TP53, TSC1, TSC2, and VHL.  The following genes were evaluated for sequence changes only: SDHA and HOXB13 c.251G>A variant only.   (a) A variant of uncertain significance (VUS) was detected in one of her MSH6 genes (c.831A>C).   (3) status post right lumpectomy and sentinel lymph node sampling 06/12/2019 for a pT2 pN1, stage IIA invasive ductal carcinoma, grade 2, with positive margins  (a) a total of 4 sentinel lymph nodes removed, one positive (with ECE), ine itc  (b) margin clearance 04/19/2019 successful (medial margin close but negative for DCIS)   (4) adjuvant chemotherapy consisting of doxorubicin and cyclophosphamide in dose dense fashion x4 starting 07/10/2019, completed 09/24/2019; planned weekly paclitaxel x12 omitted   (a) echo 06/26/2019 shows an EF of 60-65%  (b echo on 09/19/2019 shows an EF of 60-65%  (c) chemotherapy stopped after four cycles of doxorubicin and cyclophosphamide due to #5 below  (5) Multiple VTE/PE documented 10/02/2019, on intravenous  heparin initially, then Xarelto started 10/19/2019  (a) presented with SVC syndrome and bilateral pulmonary emboli on 09/28/2019  (b) s/p SVC thrombectomy and port removal 52/77/8242 complicated by hemopericardium and tamponade, necessitating pericardial window placement   (c) postop course complicated by wound dehiscence requiring wound VAC  (c) Doppler 10/03/2019 showed right lower extremity DVT  (d) CT angio and Doppler of both upper extremities 10/13/2019 found persistent acute bilateral pulmonary emboli, persistent but improved SVC thrombus, bilateral pleural effusions, and right lower lobe collapse. Bilateral upper extremity DVTs also noted  (e) Dopplers upper extremity 11/19/2019 showed clots cleared on the left, chronic DVT right internal jugular and axillary veins  (f) CT angio of the chest 10/07/2020 shows resolution of earlier pleural effusions and no evidence of active embolism (but see #9 below)  (6) COVID-19 infection documented 09/27/2020, status post remdesivir   (7) adjuvant radiation 12/24/19 - 02/06/20  Site/dose:   The patient initially received a dose of 50.4 Gy in 28 fractions to the breast and SCLV region using a 4-field approach. This was delivered using a 3-D conformal technique. The patient then received a boost to the seroma. This delivered an additional 10 Gy in 5 fractions using a 3-field photon boost technique. The total dose was 60.4 Gy.   (8) anastrozole started mid May 2021  (a) [facility] and estradiol obtained 11/15/2019 consistent with menopause  (b) not a good candidate for tamoxifen given history of DVT/PE above  (c) bone density 07/31/2020 normal (T score equals 0.1).  METASTATIC DISEASE:  Jan 2022 (9) CT angio of the chest 12/28/2021shows new liver lesions (not seen on CT angio January 2021)  (a) MRI 10/22/2020 shows multiple liver lesions, the largest 2.5 cm  (b)   liver biopsy 11/03/2020 confirms metastatic carcinoma estrogen receptor 10% positive with weak  staining intensity, HER-2 and progesterone receptor negative.  (c) bone scan 11/25/2020 shows left femoral lesion as well as other lesions   (i) left hip films not suggestive of impending pathologic fracture  (d) non-contrast head CT 11/25/2020 negative except for sinusitis  (e) CA 27-29 is informative (was 104.1 on 10/29/2020)  (10) foundation 1 study requested on 11/03/2020 liver biopsy dated 11/03/2020 shows PI K3 CA amplification, and a T p53 mutation.  There were no mutations in BRCA1 and BRCA2 or HER-2.  There is amplification of PRK CI, S0X2, T ERC, and FGF 1 2.  The microsatellite status is stable.  The tumor mutational burden is 4 mutations per Mb  (11) started paclitaxel 11/19/2018 2012, repeated day 1 day 8 of every 21-day cycle  (a) pembrolizumab added 12/17/2020, repeated every 21 days  (b) MRI liver 01/21/2021 (after 3 cycles) shows significant response  (12) zoledronate added 12/10/2020, repeated every 12 weeks    PLAN:  Veronica [name] is proceeding to the day 1 of cycle 8 treatment today.  She has no peripheral neuropathy at present.  She had very transient sensation change in the tips of her third and fourth finger of the right hand.  If this recurs or worsens or becomes more persistent we will omit next weeks Taxol treatment but in any case she needs to come in next week to get her Keytruda.  She is already scheduled for repeat liver MRI April 29, 2021.  I am expecting good news there.  If so then we will stop the Taxol and continue the [facility].  Her next Keytruda treatment would then be on August 3 and I would see her on that day.  I am upping her Synthroid to 88 mcg daily.  We will continue to follow this and continue to titrate the Synthroid to a normal TSH.  She will continue to receive the zoledronate every 12 weeks.  That will be next due on August 24  She knows to call for any other issue that may develop before the next visit.  Total encounter time 25 minutes.Sarajane Jews  C. Almina Schul, MD Medical Oncology and Hematology [facility] [place], [place] [address] Tel. [phone]    Fax. [phone]   I, Wilburn Mylar, am acting as scribe for Dr. Virgie Dad. Veronica [name].  I, Lurline Del MD, have reviewed the above documentation for accuracy and completeness, and I agree with the above.   *Total Encounter Time as defined by the Centers for Medicare and Medicaid Services includes, in addition to the face-to-face time of a patient visit (documented in the note above) non-face-to-face time: obtaining and reviewing outside history, ordering and reviewing medications, tests or procedures, care coordination (communications with other health care professionals or caregivers) and documentation in the medical record.

## 2021-04-15 ENCOUNTER — Other Ambulatory Visit: Payer: Self-pay

## 2021-04-15 ENCOUNTER — Inpatient Hospital Stay: Payer: 59 | Attending: Oncology

## 2021-04-15 ENCOUNTER — Inpatient Hospital Stay (HOSPITAL_BASED_OUTPATIENT_CLINIC_OR_DEPARTMENT_OTHER): Payer: 59 | Admitting: Oncology

## 2021-04-15 ENCOUNTER — Inpatient Hospital Stay: Payer: 59

## 2021-04-15 ENCOUNTER — Other Ambulatory Visit: Payer: Self-pay | Admitting: *Deleted

## 2021-04-15 ENCOUNTER — Other Ambulatory Visit: Payer: 59

## 2021-04-15 VITALS — BP 110/68 | HR 77 | Temp 97.3°F | Resp 18 | Ht 61.0 in | Wt 231.8 lb

## 2021-04-15 DIAGNOSIS — C50411 Malignant neoplasm of upper-outer quadrant of right female breast: Secondary | ICD-10-CM

## 2021-04-15 DIAGNOSIS — C773 Secondary and unspecified malignant neoplasm of axilla and upper limb lymph nodes: Secondary | ICD-10-CM | POA: Insufficient documentation

## 2021-04-15 DIAGNOSIS — C50412 Malignant neoplasm of upper-outer quadrant of left female breast: Secondary | ICD-10-CM | POA: Insufficient documentation

## 2021-04-15 DIAGNOSIS — C787 Secondary malignant neoplasm of liver and intrahepatic bile duct: Secondary | ICD-10-CM | POA: Diagnosis not present

## 2021-04-15 DIAGNOSIS — Z170 Estrogen receptor positive status [ER+]: Secondary | ICD-10-CM

## 2021-04-15 DIAGNOSIS — Z5112 Encounter for antineoplastic immunotherapy: Secondary | ICD-10-CM | POA: Insufficient documentation

## 2021-04-15 DIAGNOSIS — Z79899 Other long term (current) drug therapy: Secondary | ICD-10-CM | POA: Insufficient documentation

## 2021-04-15 DIAGNOSIS — Z95828 Presence of other vascular implants and grafts: Secondary | ICD-10-CM

## 2021-04-15 DIAGNOSIS — Z5111 Encounter for antineoplastic chemotherapy: Secondary | ICD-10-CM | POA: Diagnosis present

## 2021-04-15 LAB — CBC WITH DIFFERENTIAL/PLATELET
Abs Immature Granulocytes: 0.34 10*3/uL — ABNORMAL HIGH (ref 0.00–0.07)
Basophils Absolute: 0.1 10*3/uL (ref 0.0–0.1)
Basophils Relative: 2 %
Eosinophils Absolute: 0.1 10*3/uL (ref 0.0–0.5)
Eosinophils Relative: 2 %
HCT: 37.3 % (ref 36.0–46.0)
Hemoglobin: 12.4 g/dL (ref 12.0–15.0)
Immature Granulocytes: 7 %
Lymphocytes Relative: 17 %
Lymphs Abs: 0.8 10*3/uL (ref 0.7–4.0)
MCH: 27.9 pg (ref 26.0–34.0)
MCHC: 33.2 g/dL (ref 30.0–36.0)
MCV: 83.8 fL (ref 80.0–100.0)
Monocytes Absolute: 0.9 10*3/uL (ref 0.1–1.0)
Monocytes Relative: 19 %
Neutro Abs: 2.5 10*3/uL (ref 1.7–7.7)
Neutrophils Relative %: 53 %
Platelets: 245 10*3/uL (ref 150–400)
RBC: 4.45 MIL/uL (ref 3.87–5.11)
RDW: 15.9 % — ABNORMAL HIGH (ref 11.5–15.5)
WBC: 4.6 10*3/uL (ref 4.0–10.5)
nRBC: 0 % (ref 0.0–0.2)

## 2021-04-15 LAB — COMPREHENSIVE METABOLIC PANEL
ALT: 36 U/L (ref 0–44)
AST: 32 U/L (ref 15–41)
Albumin: 3.4 g/dL — ABNORMAL LOW (ref 3.5–5.0)
Alkaline Phosphatase: 92 U/L (ref 38–126)
Anion gap: 8 (ref 5–15)
BUN: 9 mg/dL (ref 6–20)
CO2: 26 mmol/L (ref 22–32)
Calcium: 9.4 mg/dL (ref 8.9–10.3)
Chloride: 106 mmol/L (ref 98–111)
Creatinine, Ser: 0.76 mg/dL (ref 0.44–1.00)
GFR, Estimated: >60 mL/min (ref >60)
Glucose, Bld: 167 mg/dL — ABNORMAL HIGH (ref 70–99)
Potassium: 4.3 mmol/L (ref 3.5–5.1)
Sodium: 140 mmol/L (ref 135–145)
Total Bilirubin: 1 mg/dL (ref 0.3–1.2)
Total Protein: 6.5 g/dL (ref 6.5–8.1)

## 2021-04-15 MED ORDER — DEXAMETHASONE SODIUM PHOSPHATE 10 MG/ML IJ SOLN
4.0000 mg | Freq: Once | INTRAMUSCULAR | Status: AC
  Administered 2021-04-15: 4 mg via INTRAVENOUS

## 2021-04-15 MED ORDER — LEVOTHYROXINE SODIUM 88 MCG PO TABS: 88.0000 ug | ORAL_TABLET | Freq: Every day | ORAL | 2 refills | Status: DC

## 2021-04-15 MED ORDER — HEPARIN SOD (PORK) LOCK FLUSH 100 UNIT/ML IV SOLN
500.0000 [IU] | Freq: Once | INTRAVENOUS | Status: AC | PRN
  Administered 2021-04-15: 500 [IU]
  Filled 2021-04-15: qty 5

## 2021-04-15 MED ORDER — SODIUM CHLORIDE 0.9% FLUSH
10.0000 mL | INTRAVENOUS | Status: DC | PRN
  Administered 2021-04-15: 10 mL
  Filled 2021-04-15: qty 10

## 2021-04-15 MED ORDER — SODIUM CHLORIDE 0.9 % IV SOLN
Freq: Once | INTRAVENOUS | Status: AC
  Filled 2021-04-15: qty 250

## 2021-04-15 MED ORDER — SODIUM CHLORIDE 0.9 % IV SOLN
90.0000 mg/m2 | Freq: Once | INTRAVENOUS | Status: AC
  Administered 2021-04-15: 186 mg via INTRAVENOUS
  Filled 2021-04-15: qty 31

## 2021-04-15 MED ORDER — SODIUM CHLORIDE 0.9% FLUSH
10.0000 mL | INTRAVENOUS | Status: DC | PRN
  Administered 2021-04-15: 10 mL via INTRAVENOUS
  Filled 2021-04-15: qty 10

## 2021-04-15 MED ORDER — DIPHENHYDRAMINE HCL 50 MG/ML IJ SOLN
25.0000 mg | Freq: Once | INTRAMUSCULAR | Status: AC
  Administered 2021-04-15: 25 mg via INTRAVENOUS

## 2021-04-15 MED ORDER — DEXAMETHASONE SODIUM PHOSPHATE 10 MG/ML IJ SOLN
INTRAMUSCULAR | Status: AC
  Filled 2021-04-15: qty 1

## 2021-04-15 MED ORDER — FAMOTIDINE 20 MG IN NS 100 ML IVPB
INTRAVENOUS | Status: AC
  Filled 2021-04-15: qty 100

## 2021-04-15 MED ORDER — KETOCONAZOLE 2 % EX CREA: 1.0000 "application " | TOPICAL_CREAM | Freq: Every day | CUTANEOUS | 4 refills | Status: AC

## 2021-04-15 MED ORDER — DIPHENHYDRAMINE HCL 50 MG/ML IJ SOLN
INTRAMUSCULAR | Status: AC
  Filled 2021-04-15: qty 1

## 2021-04-15 MED ORDER — FAMOTIDINE 20 MG IN NS 100 ML IVPB
20.0000 mg | Freq: Once | INTRAVENOUS | Status: AC
  Administered 2021-04-15: 20 mg via INTRAVENOUS

## 2021-04-15 NOTE — Patient Instructions (Signed)

## 2021-04-15 NOTE — Patient Instructions (Signed)
[facility] MEDICAL [facility]COLOG[facility]   Discharge Instructions: Thank you for choosing [facility] to provide your oncology and hematology care.   If you have a lab appointment with the Cancer Center, please go directly to the Cancer Center and check in at the registration area.   Wear comfortable clothing and clothing appropriate for easy access to any Portacath or PICC line.   We strive to give you quality time with your provider. You may need to reschedule your appointment if you arrive late (15 or more minutes).  Arriving late affects you and other patients whose appointments are after yours.  Also, if you miss three or more appointments without notifying the office, you may be dismissed from the clinic at the provider's discretion.      For prescription refill requests, have your pharmacy contact our office and allow 72 hours for refills to be completed.    Today you received the following chemotherapy and/or immunotherapy agents: paclitaxel.      To help prevent nausea and vomiting after your treatment, we encourage you to take your nausea medication as directed.  BELOW ARE SYMPTOMS THAT SHOULD BE REPORTED IMMEDIATELY: *FEVER GREATER THAN 100.4 F (38 C) OR HIGHER *CHILLS OR SWEATING *NAUSEA AND VOMITING THAT IS NOT CONTROLLED WITH YOUR NAUSEA MEDICATION *UNUSUAL SHORTNESS OF BREATH *UNUSUAL BRUISING OR BLEEDING *URINARY PROBLEMS (pain or burning when urinating, or frequent urination) *BOWEL PROBLEMS (unusual diarrhea, constipation, pain near the anus) TENDERNESS IN MOUTH AND THROAT WITH OR WITHOUT PRESENCE OF ULCERS (sore throat, sores in mouth, or a toothache) UNUSUAL RASH, SWELLING OR PAIN  UNUSUAL VAGINAL DISCHARGE OR ITCHING   Items with * indicate a potential emergency and should be followed up as soon as possible or go to the Emergency Department if any problems should occur.  Please show the CHEMOTHERAPY ALERT CARD or IMMUNOTHERAPY ALERT CARD at check-in  to the Emergency Department and triage nurse.  Should you have questions after your visit or need to cancel or reschedule your appointment, please contact [facility]  Dept: [phone]  and follow the prompts.  Office hours are 8:00 a.m. to 4:30 p.m. Monday - Friday. Please note that voicemails left after 4:00 p.m. may not be returned until the following business day.  We are closed weekends and major holidays. You have access to a nurse at all times for urgent questions. Please call the main number to the clinic Dept: [phone] and follow the prompts.   For any non-urgent questions, you may also contact your provider using MyChart. We now offer e-Visits for anyone 18 and older to request care online for non-urgent symptoms. For details visit [email or web address].   Also download the MyChart app! Go to the app store, search "MyChart", open the app, select [facility], and log in with your MyChart username and password.  Due to Covid, a mask is required upon entering the hospital/clinic. If you do not have a mask, one will be given to you upon arrival. For doctor visits, patients may have 1 support person aged 18 or older with them. For treatment visits, patients cannot have anyone with them due to current Covid guidelines and our immunocompromised population.   

## 2021-04-16 ENCOUNTER — Telehealth: Payer: Self-pay | Admitting: Hematology and Oncology

## 2021-04-16 NOTE — Telephone Encounter (Signed)
Scheduled appointment per 07/06 los. Patient will receive updated calender.  

## 2021-04-22 ENCOUNTER — Ambulatory Visit: Payer: 59

## 2021-04-22 ENCOUNTER — Other Ambulatory Visit: Payer: 59

## 2021-04-23 ENCOUNTER — Other Ambulatory Visit: Payer: Self-pay

## 2021-04-23 ENCOUNTER — Other Ambulatory Visit: Payer: Self-pay | Admitting: Oncology

## 2021-04-23 ENCOUNTER — Inpatient Hospital Stay: Payer: 59

## 2021-04-23 VITALS — BP 114/63 | HR 73 | Temp 98.4°F | Resp 18 | Wt 230.5 lb

## 2021-04-23 DIAGNOSIS — Z170 Estrogen receptor positive status [ER+]: Secondary | ICD-10-CM

## 2021-04-23 DIAGNOSIS — Z95828 Presence of other vascular implants and grafts: Secondary | ICD-10-CM

## 2021-04-23 DIAGNOSIS — Z7189 Other specified counseling: Secondary | ICD-10-CM

## 2021-04-23 DIAGNOSIS — C787 Secondary malignant neoplasm of liver and intrahepatic bile duct: Secondary | ICD-10-CM

## 2021-04-23 DIAGNOSIS — C50411 Malignant neoplasm of upper-outer quadrant of right female breast: Secondary | ICD-10-CM

## 2021-04-23 DIAGNOSIS — Z5112 Encounter for antineoplastic immunotherapy: Secondary | ICD-10-CM | POA: Diagnosis not present

## 2021-04-23 LAB — COMPREHENSIVE METABOLIC PANEL
ALT: 33 U/L (ref 0–44)
AST: 27 U/L (ref 15–41)
Albumin: 3.3 g/dL — ABNORMAL LOW (ref 3.5–5.0)
Alkaline Phosphatase: 86 U/L (ref 38–126)
Anion gap: 7 (ref 5–15)
BUN: 13 mg/dL (ref 6–20)
CO2: 24 mmol/L (ref 22–32)
Calcium: 9.2 mg/dL (ref 8.9–10.3)
Chloride: 107 mmol/L (ref 98–111)
Creatinine, Ser: 0.74 mg/dL (ref 0.44–1.00)
GFR, Estimated: >60 mL/min (ref >60)
Glucose, Bld: 181 mg/dL — ABNORMAL HIGH (ref 70–99)
Potassium: 4.2 mmol/L (ref 3.5–5.1)
Sodium: 138 mmol/L (ref 135–145)
Total Bilirubin: 0.7 mg/dL (ref 0.3–1.2)
Total Protein: 6.3 g/dL — ABNORMAL LOW (ref 6.5–8.1)

## 2021-04-23 LAB — CBC WITH DIFFERENTIAL/PLATELET
Abs Immature Granulocytes: 0.11 10*3/uL — ABNORMAL HIGH (ref 0.00–0.07)
Basophils Absolute: 0.1 10*3/uL (ref 0.0–0.1)
Basophils Relative: 1 %
Eosinophils Absolute: 0.1 10*3/uL (ref 0.0–0.5)
Eosinophils Relative: 2 %
HCT: 34.7 % — ABNORMAL LOW (ref 36.0–46.0)
Hemoglobin: 11.7 g/dL — ABNORMAL LOW (ref 12.0–15.0)
Immature Granulocytes: 2 %
Lymphocytes Relative: 21 %
Lymphs Abs: 1.1 10*3/uL (ref 0.7–4.0)
MCH: 28.1 pg (ref 26.0–34.0)
MCHC: 33.7 g/dL (ref 30.0–36.0)
MCV: 83.2 fL (ref 80.0–100.0)
Monocytes Absolute: 0.3 10*3/uL (ref 0.1–1.0)
Monocytes Relative: 6 %
Neutro Abs: 3.5 10*3/uL (ref 1.7–7.7)
Neutrophils Relative %: 68 %
Platelets: 261 10*3/uL (ref 150–400)
RBC: 4.17 MIL/uL (ref 3.87–5.11)
RDW: 15.2 % (ref 11.5–15.5)
WBC: 5.1 10*3/uL (ref 4.0–10.5)
nRBC: 0 % (ref 0.0–0.2)

## 2021-04-23 LAB — TSH: TSH: 18.191 u[IU]/mL — ABNORMAL HIGH (ref 0.308–3.960)

## 2021-04-23 MED ORDER — SODIUM CHLORIDE 0.9 % IV SOLN
90.0000 mg/m2 | Freq: Once | INTRAVENOUS | Status: AC
  Administered 2021-04-23: 186 mg via INTRAVENOUS
  Filled 2021-04-23: qty 31

## 2021-04-23 MED ORDER — DIPHENHYDRAMINE HCL 50 MG/ML IJ SOLN
INTRAMUSCULAR | Status: AC
  Filled 2021-04-23: qty 1

## 2021-04-23 MED ORDER — SODIUM CHLORIDE 0.9% FLUSH
10.0000 mL | INTRAVENOUS | Status: DC | PRN
  Administered 2021-04-23: 10 mL
  Filled 2021-04-23: qty 10

## 2021-04-23 MED ORDER — SODIUM CHLORIDE 0.9 % IV SOLN
200.0000 mg | Freq: Once | INTRAVENOUS | Status: AC
  Administered 2021-04-23: 200 mg via INTRAVENOUS
  Filled 2021-04-23: qty 8

## 2021-04-23 MED ORDER — SODIUM CHLORIDE 0.9% FLUSH
10.0000 mL | INTRAVENOUS | Status: AC | PRN
Start: 2021-04-23 — End: 2021-04-23
  Administered 2021-04-23: 10 mL
  Filled 2021-04-23: qty 10

## 2021-04-23 MED ORDER — DEXAMETHASONE SODIUM PHOSPHATE 10 MG/ML IJ SOLN
4.0000 mg | Freq: Once | INTRAMUSCULAR | Status: AC
  Administered 2021-04-23: 4 mg via INTRAVENOUS

## 2021-04-23 MED ORDER — HEPARIN SOD (PORK) LOCK FLUSH 100 UNIT/ML IV SOLN
500.0000 [IU] | Freq: Once | INTRAVENOUS | Status: AC | PRN
  Administered 2021-04-23: 500 [IU]
  Filled 2021-04-23: qty 5

## 2021-04-23 MED ORDER — FAMOTIDINE 20 MG IN NS 100 ML IVPB
20.0000 mg | Freq: Once | INTRAVENOUS | Status: AC
Start: 2021-04-23 — End: 2021-04-23
  Administered 2021-04-23: 20 mg via INTRAVENOUS

## 2021-04-23 MED ORDER — DIPHENHYDRAMINE HCL 50 MG/ML IJ SOLN
25.0000 mg | Freq: Once | INTRAMUSCULAR | Status: AC
Start: 2021-04-23 — End: 2021-04-23
  Administered 2021-04-23: 25 mg via INTRAVENOUS

## 2021-04-23 MED ORDER — SODIUM CHLORIDE 0.9 % IV SOLN
Freq: Once | INTRAVENOUS | Status: AC
  Filled 2021-04-23: qty 250

## 2021-04-23 MED ORDER — FAMOTIDINE 20 MG IN NS 100 ML IVPB
INTRAVENOUS | Status: AC
  Filled 2021-04-23: qty 100

## 2021-04-23 MED ORDER — DEXAMETHASONE SODIUM PHOSPHATE 10 MG/ML IJ SOLN
INTRAMUSCULAR | Status: AC
  Filled 2021-04-23: qty 1

## 2021-04-23 NOTE — Patient Instructions (Signed)
[facility]  Discharge Instructions: Thank you for choosing [facility] to provide your oncology and hematology care.   If you have a lab appointment with the [facility], please go directly to the [facility] and check in at the registration area.   Wear comfortable clothing and clothing appropriate for easy access to any Portacath or PICC line.   We strive to give you quality time with your provider. You may need to reschedule your appointment if you arrive late (15 or more minutes).  Arriving late affects you and other patients whose appointments are after yours.  Also, if you miss three or more appointments without notifying the office, you may be dismissed from the clinic at the provider's discretion.      For prescription refill requests, have your pharmacy contact our office and allow 72 hours for refills to be completed.    Today you received the following chemotherapy and/or immunotherapy agents keytruda, taxol      To help prevent nausea and vomiting after your treatment, we encourage you to take your nausea medication as directed.  BELOW ARE SYMPTOMS THAT SHOULD BE REPORTED IMMEDIATELY: *FEVER GREATER THAN 100.4 F (38 C) OR HIGHER *CHILLS OR SWEATING *NAUSEA AND VOMITING THAT IS NOT CONTROLLED WITH YOUR NAUSEA MEDICATION *UNUSUAL SHORTNESS OF BREATH *UNUSUAL BRUISING OR BLEEDING *URINARY PROBLEMS (pain or burning when urinating, or frequent urination) *BOWEL PROBLEMS (unusual diarrhea, constipation, pain near the anus) TENDERNESS IN MOUTH AND THROAT WITH OR WITHOUT PRESENCE OF ULCERS (sore throat, sores in mouth, or a toothache) UNUSUAL RASH, SWELLING OR PAIN  UNUSUAL VAGINAL DISCHARGE OR ITCHING   Items with * indicate a potential emergency and should be followed up as soon as possible or go to the Emergency Department if any problems should occur.  Please show the CHEMOTHERAPY ALERT CARD or IMMUNOTHERAPY ALERT CARD at  check-in to the Emergency Department and triage nurse.  Should you have questions after your visit or need to cancel or reschedule your appointment, please contact [facility]  Dept: [phone]  and follow the prompts.  Office hours are 8:00 a.m. to 4:30 p.m. Monday - Friday. Please note that voicemails left after 4:00 p.m. may not be returned until the following business day.  We are closed weekends and major holidays. You have access to a nurse at all times for urgent questions. Please call the main number to the clinic Dept: [phone] and follow the prompts.   For any non-urgent questions, you may also contact your provider using MyChart. We now offer e-Visits for anyone 16 and older to request care online for non-urgent symptoms. For details visit [email or web address].   Also download the MyChart app! Go to the app store, search "MyChart", open the app, select [facility], and log in with your MyChart username and password.  Due to Covid, a mask is required upon entering the hospital/clinic. If you do not have a mask, one will be given to you upon arrival. For doctor visits, patients may have 1 support person aged 2 or older with them. For treatment visits, patients cannot have anyone with them due to current Covid guidelines and our immunocompromised population.

## 2021-04-24 LAB — T4: T4, Total: 7.2 ug/dL (ref 4.5–12.0)

## 2021-04-24 LAB — CANCER ANTIGEN 27.29: CA 27.29: 40.8 U/mL — ABNORMAL HIGH (ref 0.0–38.6)

## 2021-04-29 ENCOUNTER — Other Ambulatory Visit: Payer: Self-pay

## 2021-04-29 ENCOUNTER — Ambulatory Visit (HOSPITAL_COMMUNITY)
Admission: RE | Admit: 2021-04-29 | Discharge: 2021-04-29 | Disposition: A | Discharge status: Home / Self Care | Payer: 59 | Source: Ambulatory Visit | Attending: Oncology | Admitting: Oncology

## 2021-04-29 DIAGNOSIS — E039 Hypothyroidism, unspecified: Secondary | ICD-10-CM

## 2021-04-29 DIAGNOSIS — Z170 Estrogen receptor positive status [ER+]: Secondary | ICD-10-CM | POA: Insufficient documentation

## 2021-04-29 DIAGNOSIS — C787 Secondary malignant neoplasm of liver and intrahepatic bile duct: Secondary | ICD-10-CM

## 2021-04-29 DIAGNOSIS — C50411 Malignant neoplasm of upper-outer quadrant of right female breast: Secondary | ICD-10-CM | POA: Insufficient documentation

## 2021-04-29 IMAGING — MR MR ABDOMEN WO/W CM
18 series · 48 of 48 positions shown · IV contrast (gadavist)
Comparison: Multiple priors including most recent MRI abdomen [facility]

CLINICAL DATA: Breast cancer metastatic to liver, assess treatment
response.

EXAM:
MRI ABDOMEN WITHOUT AND WITH CONTRAST
TECHNIQUE: Multiplanar multisequence MR imaging of the abdomen was performed
both before and after the administration of intravenous contrast.
CONTRAST:  10mL GADAVIST GADOBUTROL 1 MMOL/ML IV SOLN

[Series 2: DWI · axial · 6.0mm · 1.68mm/px · z∈[-113,+168]mm · 4 of 80 slices shown (1 of 2)]
[im 1/80]
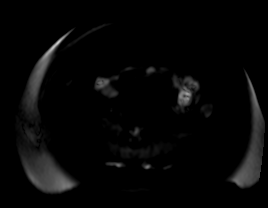
[im 27/80]
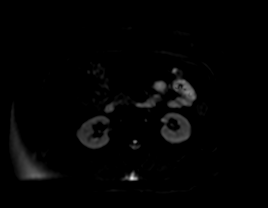
[im 53/80]
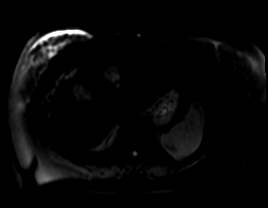
[im 80/80]
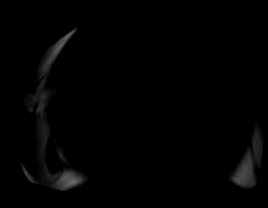

[Series 3: DWI · axial · 6.0mm · 1.68mm/px · z∈[-113,+168]mm · 2 of 40 slices shown (2 of 2)]
[im 1/40]
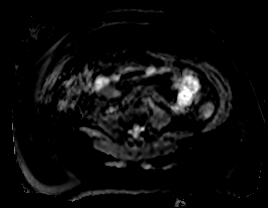
[im 40/40]
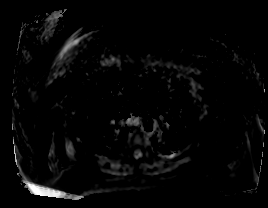

[Series 6: T2 fat-sat · axial · 6.0mm · 1.25mm/px · z∈[-114,+167]mm · 2 of 40 slices shown]
[im 1/40]
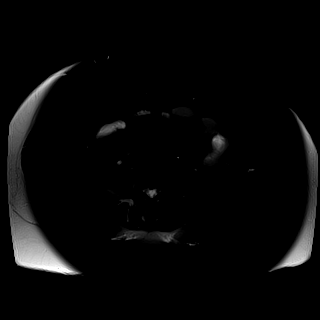
[im 40/40]
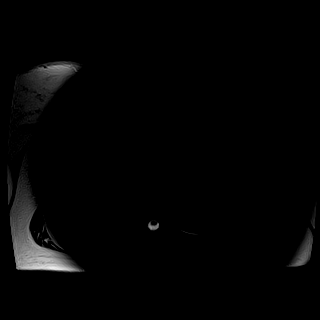

[Series 7: cor haste · coronal · 6.0mm · 1.56mm/px · 2 of 38 slices shown]
[im 1/38]
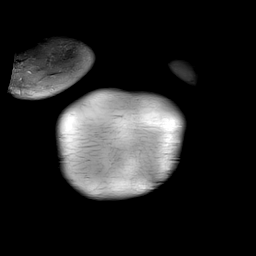
[im 38/38]
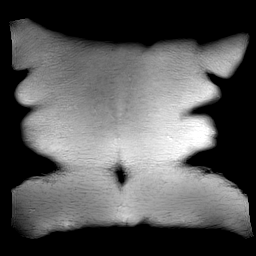

[Series 8: bSSFP · axial · 6.0mm · 2.34mm/px · z∈[-134,+148]mm · 2 of 48 slices shown]
[im 1/48]
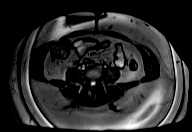
[im 48/48]
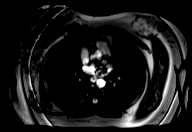

[Series 9: T1 dynamic · axial · 3.0mm · 1.41mm/px · z∈[-135,+150]mm · 3 of 96 slices shown (1 of 9)]
[im 1/96]
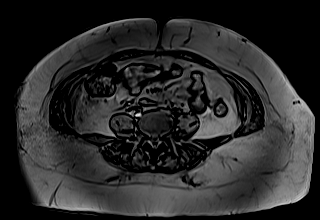
[im 48/96]
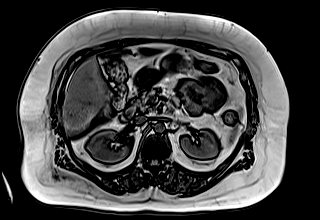
[im 96/96]
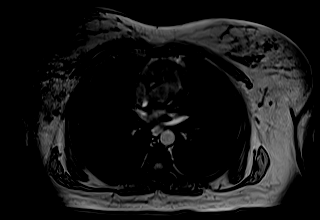

[Series 10: T1 dynamic · axial · 3.0mm · 1.41mm/px · z∈[-135,+150]mm · 3 of 96 slices shown (2 of 9)]
[im 1/96]
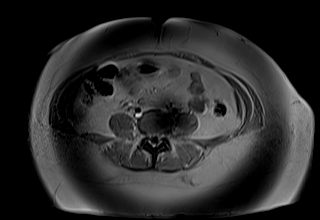
[im 48/96]
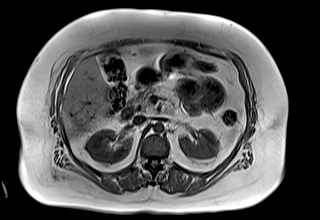
[im 96/96]
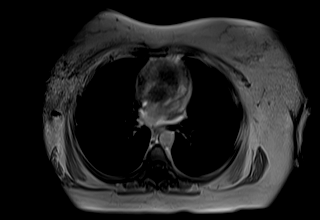

[Series 17: T1 dynamic · axial · 3.0mm · 1.41mm/px · z∈[-135,+150]mm · 3 of 96 slices shown (3 of 9)]
[im 1/96]
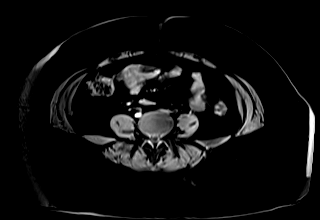
[im 48/96]
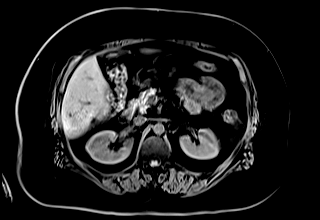
[im 96/96]
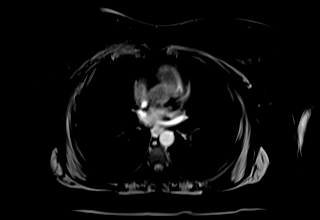

[Series 19: T1 dynamic · axial · 3.0mm · 1.41mm/px · z∈[-135,+150]mm · 3 of 96 slices shown (4 of 9)]
[im 1/96]
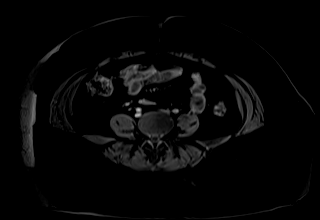
[im 48/96]
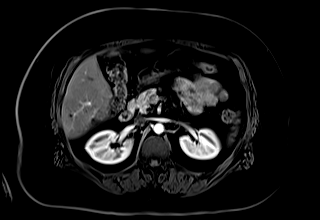
[im 96/96]
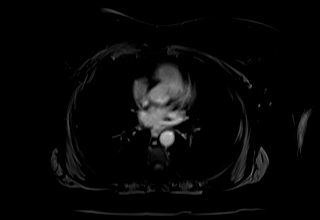

[Series 20: T1 dynamic · axial · 3.0mm · 1.41mm/px · z∈[-135,+150]mm · 3 of 96 slices shown (5 of 9)]
[im 1/96]
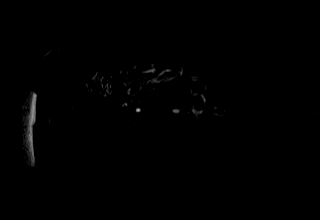
[im 48/96]
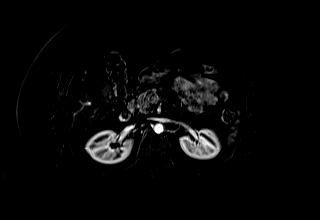
[im 96/96]
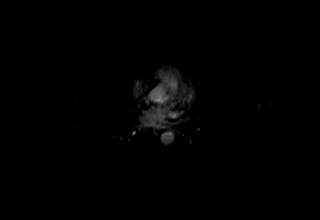

[Series 22: T1 dynamic · axial · 3.0mm · 1.41mm/px · z∈[-135,+150]mm · 3 of 96 slices shown (6 of 9)]
[im 1/96]
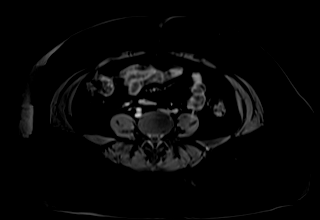
[im 48/96]
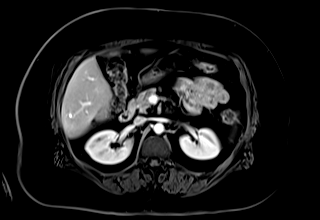
[im 96/96]
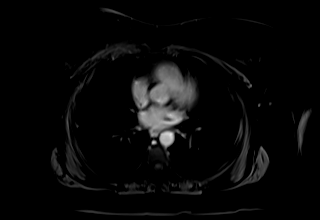

[Series 23: T1 dynamic · axial · 3.0mm · 1.41mm/px · z∈[-135,+150]mm · 3 of 96 slices shown (7 of 9)]
[im 1/96]
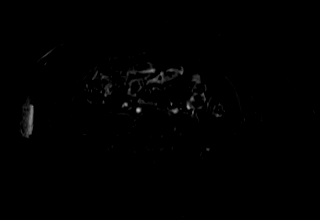
[im 48/96]
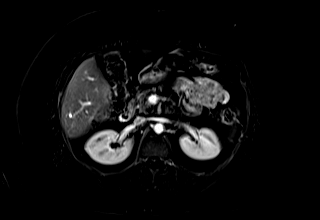
[im 96/96]
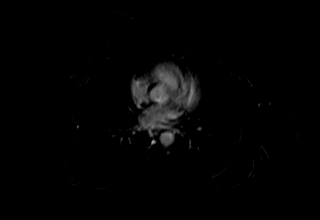

[Series 25: T1 dynamic · axial · 3.0mm · 1.41mm/px · z∈[-135,+150]mm · 3 of 96 slices shown (8 of 9)]
[im 1/96]
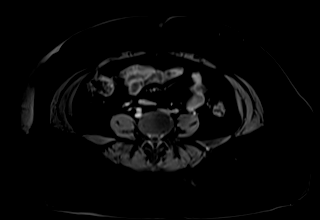
[im 48/96]
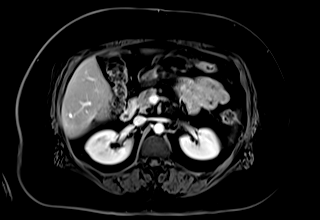
[im 96/96]
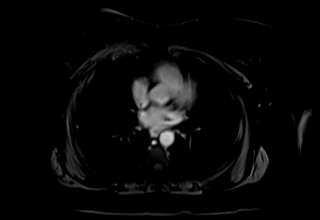

[Series 26: T1 dynamic · axial · 3.0mm · 1.41mm/px · z∈[-135,+150]mm · 3 of 96 slices shown (9 of 9)]
[im 1/96]
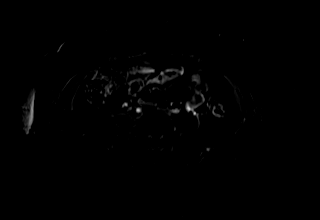
[im 48/96]
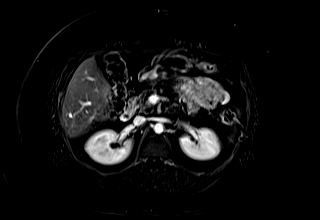
[im 96/96]
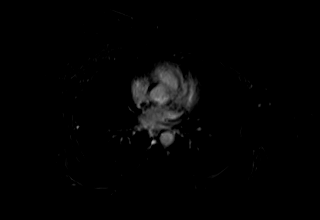

[Series 27: ax_haste_mbh · axial · 6.0mm · 1.41mm/px · 1 of 40 slices shown]
[im 1/40]
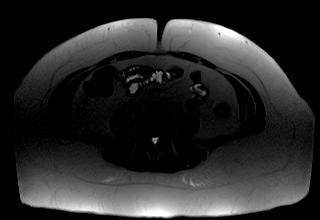

[Series 29: cor_vibe_dixon_delayed_w · coronal · 4.0mm · 1.76mm/px · 2 of 64 slices shown]
[im 1/64]
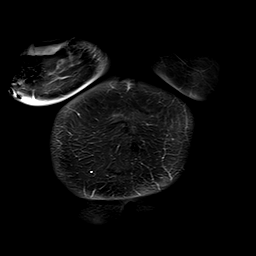
[im 64/64]
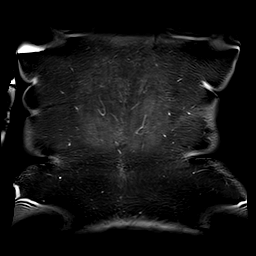

[Series 31: ax_dixon_delayed_w_reg · axial · 3.0mm · 1.41mm/px · z∈[-135,+150]mm · 3 of 96 slices shown]
[im 1/96]
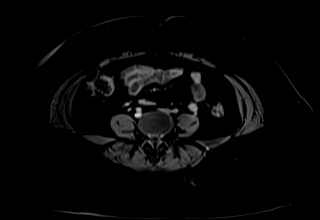
[im 48/96]
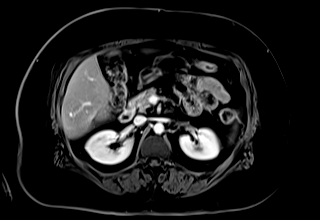
[im 96/96]
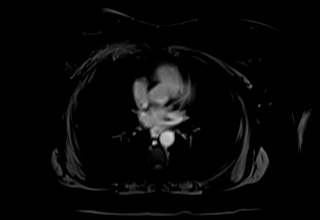

[Series 32: ax_dixon_delayed_w_reg_sub · axial · 3.0mm · 1.41mm/px · z∈[-135,+150]mm · 3 of 96 slices shown]
[im 1/96]
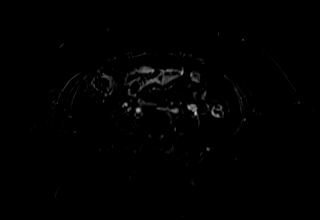
[im 48/96]
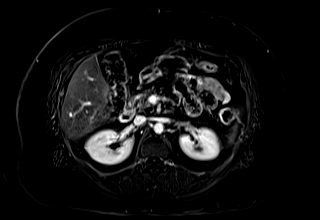
[im 96/96]
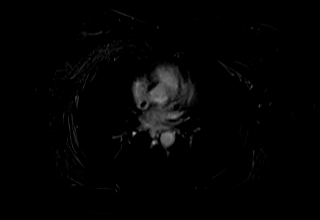

[48 of 48 positions shown; findings below may reference images not displayed]

FINDINGS: Lower chest: No acute abnormality.

Hepatobiliary: Hypoenhancing bilobar hepatic metastases are again
visualized, the majority of which now demonstrate a rim of
perilesional enhancement with corresponded reduced diffusivity. No
new suspicious lesions visualized. The index lesions are as follows:

-subcapsular anterior hepatic segment VIII lesion measures 2.5 x
cm on image 29/19 previously 2.7 x 2.2 cm.

-anterior liver dome hepatic segment VII lesion measures 3.9 x
cm on image 23/19 previously 3.9 x 1.9 cm

Pancreas: No mass, inflammatory changes, or other parenchymal
abnormality identified.

Spleen:  Within normal limits in size and appearance.

Adrenals/Urinary Tract: No masses identified. No evidence of
hydronephrosis.

Stomach/Bowel: Visualized portions within the abdomen are
unremarkable.

Vascular/Lymphatic: No pathologically enlarged lymph nodes
identified. No abdominal aortic aneurysm demonstrated.

Other:  No abdominal ascites.

Musculoskeletal: No suspicious bone lesions identified.
IMPRESSION: Bilobar hepatic metastases are grossly stable in size. However,
majority of which now demonstrate a rim of perilesional enhancement
with associated reduced diffusivity, which is nonspecific and may
reflect pseudo enhancement related to treatment effect verses
disease progression. No new suspicious hepatic lesions or abdominal
adenopathy visualized. Recommend attention on close interval
follow-up MRI.

## 2021-04-29 MED ORDER — GADOBUTROL 1 MMOL/ML IV SOLN
10.0000 mL | Freq: Once | INTRAVENOUS | Status: AC | PRN
  Administered 2021-04-29: 10 mL via INTRAVENOUS

## 2021-05-12 ENCOUNTER — Other Ambulatory Visit: Payer: Self-pay

## 2021-05-12 DIAGNOSIS — Z170 Estrogen receptor positive status [ER+]: Secondary | ICD-10-CM

## 2021-05-12 DIAGNOSIS — C50411 Malignant neoplasm of upper-outer quadrant of right female breast: Secondary | ICD-10-CM

## 2021-05-12 NOTE — Progress Notes (Signed)
[name]  [phone] [phone]    ID: Veronica [name] DOB: May 28, 1971  MR#: [id number]  YOK#:[id number]  Patient Care Team: Jamie Kato as PCP - General (Family Medicine) Mauro Kaufmann, RN as Oncology Nurse Navigator [name], RN as Oncology Nurse Navigator Erroll Luna, MD as Consulting Physician (General Surgery) Eusebia Grulke, Virgie Dad, MD as Consulting Physician (Oncology) Kyung Rudd, MD as Consulting Physician (Radiation Oncology) Gaye Pollack, MD as Consulting Physician (Cardiothoracic Surgery) Serafina Mitchell, MD as Consulting Physician (Vascular Surgery) Benedict Needy, MD as Referring Physician (Hematology and Oncology) Chauncey Cruel, MD OTHER MD:  CHIEF COMPLAINT: Estrogen receptor positive breast cancer  CURRENT TREATMENT: Paclitaxel day 1 and day 8 of each 21-day cycle, pembrolizumab given with day 8 of each cycle; zoledronate every 12 weeks   INTERVAL HISTORY: Veronica [name] returns today for follow-up and treatment of her now metastatic breast cancer.  She is accompanied by her husband [place].  Since her last visit, she underwent repeat liver MRI on 04/29/2021 showing: bilobar hepatic metastases grossly stable in size (though all 3 measurable lesions show a decrease of about 2 mm), the majority of which now demonstrate a nonspecific rim of enhancement; no new suspicious hepatic lesions or abdominal adenopathy.  She started paclitaxel, given on days 1 and 8, every 21 days, on 11/20/2020.  She started pembrolizumab on 12/17/2020. Her last paclitaxel dose was 04/23/2021.  She is continuing on the pembrolizumab with a dose due today.  Her last zoledronate was 03/04/2021.  Next dose will be 06/03/2021  We are following her CA 27-29 which continues to vary without definitive treand: Lab Results  Component Value Date   CA2729 40.8 (H) 04/23/2021   CA2729 32.5 03/25/2021   CA2729 32.3 03/12/2021   CA2729 44.2  (H) 03/04/2021   CA2729 29.8 02/18/2021   We are following her TSH. She was started on synthroid on 03/04/21. Lab Results  Component Value Date   TSH 18.191 (H) 04/23/2021   TSH 32.685 (H) 03/25/2021   TSH 26.522 (H) 03/12/2021   TSH 11.424 (H) 03/04/2021   TSH 0.103 (L) 02/18/2021    REVIEW OF SYSTEMS: Jhanae has been off Taxol now for 3 weeks.  She feels a little bit better, with less fatigue.  She is walking a little more, up to 2 miles recently.  She still has a little bit of numbness in the middle 3 fingers of the right hand only.  This really only occurs when she uses the right hand a lot.  She has had no unusual headaches visual changes nausea vomiting taste alteration or loss of appetite.  In fact she has gained some weight.  Detailed review of systems today was otherwise stable   COVID 19 VACCINATION STATUS: refuses vaccination, had COVID 29 September 2019   HISTORY OF CURRENT ILLNESS: From the original intake note:  Veronica [name] had routine screening mammography on 04/27/2019 showing a possible abnormality in the right breast. She underwent right diagnostic mammography with tomography and right breast ultrasonography at [facility] on 05/01/2019 showing: breast density category C; highly suspicious 1.7 cm irregular mass in the right breast at 10 o'clock, 8 cm from the nipple; indeterminate hypoechoic round mass in the right axillary tail/low axilla. Physical exam showed no suspicious lumps.  Accordingly on 05/02/2019 she proceeded to biopsy of the right breast area in question. The pathology from this procedure ([id number]) showed: invasive ductal carcinoma, grade 3; ductal carcinoma in  situ; lymphovascular invasion present. Prognostic indicators significant for: estrogen receptor, 30% positive with moderate staining intensity, and progesterone receptor, 30% positive with strong staining intensity. Proliferation marker Ki67 at 20%. HER2 negative by immunohistochemistry (1+).  The  second "satellite" lesion was a lymph node which was also positive.  The patient's subsequent history is as detailed below.   PAST MEDICAL HISTORY: Past Medical History:  Diagnosis Date   Cancer ([place]) 2020   breast Cancer, 2022 liver mets   Chronic low back pain with left-sided sciatica    Family history of leukemia    Family history of stomach cancer    Hypertension    Personal history of chemotherapy    Personal history of radiation therapy    finished june '21    PAST SURGICAL HISTORY: Past Surgical History:  Procedure Laterality Date   APPLICATION OF WOUND VAC N/A 10/16/2019   Procedure: APPLICATION OF WOUND VAC;  Surgeon: Gaye Pollack, MD;  Location: [place];  Service: Vascular;  Laterality: N/A;   BREAST BIOPSY Right 05/02/2019   right clips X2   BREAST LUMPECTOMY Right    06/2019   BREAST LUMPECTOMY WITH RADIOACTIVE SEED AND SENTINEL LYMPH NODE BIOPSY Right 06/12/2019   Procedure: RIGHT BREAST RADIOACTIVE SEED LUMPECTOMY  AND RIGHT AXILLARY SEED TARGETED LYMPH NODE AND AXILLARY SENTINEL LYMPH NODE [place];  Surgeon: Erroll Luna, MD;  Location: [place];  Service: General;  Laterality: Right;  PEC BLOCK   C/S x2  '98 '[place]  10/02/2019   Procedure: Insertion Central Line Adult;  Surgeon: Serafina Mitchell, MD;  Location: [place];  Service: Vascular;;   CESAREAN SECTION     CHOLECYSTECTOMY     I & D EXTREMITY N/A 10/16/2019   Procedure: DEBRIDEMENT of DEHISCED MIDLINE SURGICAL INCISION;  Surgeon: Gaye Pollack, MD;  Location: [facility];  Service: Vascular;  Laterality: N/A;   IR IMAGING GUIDED PORT INSERTION  11/24/2020   LAPAROSCOPIC ASSISTED VAGINAL HYSTERECTOMY  05/17/2011   Procedure: LAPAROSCOPIC ASSISTED VAGINAL HYSTERECTOMY;  Surgeon: Sharene Butters;  Location: [facility];  Service: Gynecology;  Laterality: N/A;  Laparoscopic Assisted Vaginal Hysterectomy With Lysis Of Adhesions   PERICARDIAL WINDOW N/A 10/02/2019    Procedure: Pericardial Window;  Surgeon: Serafina Mitchell, MD;  Location: [place];  Service: Vascular;  Laterality: N/A;   PORT-A-CATH REMOVAL  10/02/2019   Procedure: Removal Port-A-Cath;  Surgeon: Serafina Mitchell, MD;  Location: [place];  Service: Vascular;;   PORTACATH PLACEMENT Right 06/12/2019   Procedure: INSERTION PORT-A-CATH WITH ULTRASOUND;  Surgeon: Erroll Luna, MD;  Location: [facility];  Service: General;  Laterality: Right;   RE-EXCISION OF BREAST LUMPECTOMY Right 07/17/2019   Procedure: RE-EXCISION OF RIGHT BREAST LUMPECTOMY;  Surgeon: Erroll Luna, MD;  Location: [facility];  Service: General;  Laterality: Right;   TUBAL LIGATION     ULTRASOUND GUIDANCE FOR VASCULAR ACCESS  10/02/2019   Procedure: Ultrasound Guidance For Vascular Access;  Surgeon: Serafina Mitchell, MD;  Location: [facility];  Service: Vascular;;   VENOGRAM N/A 10/02/2019   Procedure: Central VENOGRAM;  Surgeon: Serafina Mitchell, MD;  Location[facility];  Service: Vascular;  Laterality: N/A;    FAMILY HISTORY: Family History  Problem Relation Age of Onset   Arthritis Mother    COPD Mother    Hypertension Mother    Hyperlipidemia Mother    Leukemia Brother 50   Stomach cancer Maternal Grandmother  late 60s  Patient's parents are living (as of September 2020). Her father is 51 and her mother is 68 as of 04/2019. The patient denies a family hx of breast or ovarian cancer. She has 7 siblings, 6 brothers and 1 sister. She reports her maternal grandmother was diagnosed with stomach cancer at age 42. Her brother was diagnosed with leukemia at age 71.   GYNECOLOGIC HISTORY:  No LMP recorded. Patient has had a hysterectomy. Menarche: 50 years old Age at first live birth: 50 years old GX P 2 (+1 stillborn) LMP age 14-42 Contraceptive: unsure, maybe for a year at age 69. HRT no Hysterectomy? Yes, 05/2011 BSO? 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 She is married. Husband Sheppard Plumber") is a Hydrologist for a telephone company. She lives at home with her husband and two daughters. Daughter Suszanne Conners, age 56 is a Scientist, water quality. Daughter Jarrett Soho, age 65, is a Ship broker.  She graduated from high school June 6283 and has a Actor, hoping to become an Therapist, sports    ADVANCED DIRECTIVES: Husband Heath Lark is her HCPOA.   HEALTH MAINTENANCE: Social History   Tobacco Use   Smoking status: Never   Smokeless tobacco: Never  Vaping Use   Vaping Use: Never used  Substance Use Topics   Alcohol use: Not Currently    Alcohol/week: 2.0 standard drinks    Types: 2 Standard drinks or equivalent per week    Comment: "2-3 a week"   Drug use: No     Colonoscopy: n/a  PAP: 02/2018  Bone density: n/a   No Known Allergies  Current Outpatient Medications  Medication Sig Dispense Refill   Cholecalciferol (VITAMIN D3) 25 MCG (1000 UT) CHEW Chew 1 tablet by mouth daily.      ketoconazole (NIZORAL) 2 % cream Apply 1 application topically daily. 15 g 4   levothyroxine (SYNTHROID) 100 MCG tablet Take 1 tablet (100 mcg total) by mouth daily before breakfast. 30 tablet 6   metoprolol tartrate (LOPRESSOR) 25 MG tablet Take 1 tablet (25 mg total) by mouth 2 (two) times daily. 60 tablet 0   rivaroxaban (XARELTO) 20 MG TABS tablet Take 1 tablet (20 mg total) by mouth daily with supper. 90 tablet 3   No current facility-administered medications for this visit.   Facility-Administered Medications Ordered in Other Visits  Medication Dose Route Frequency Provider Last Rate Last Admin   sodium chloride flush (NS) 0.9 % injection 10 mL  10 mL Intravenous PRN Jayleon Mcfarlane, Virgie Dad, MD   10 mL at 03/04/21 0935    OBJECTIVE: white woman who appears stated age  Vitals:   05/13/21 0848  BP: 120/74  Pulse: 75  Resp: 18  Temp: (!) 97.4 F (36.3 C)  SpO2: 100%   Wt Readings from Last 3 Encounters:  05/13/21 230 lb 14.4 oz (104.7 kg)  04/23/21 230 lb 8 oz  (104.6 kg)  04/15/21 231 lb 12.8 oz (105.1 kg)   Body mass index is 43.63 kg/m.    ECOG FS:1 - Symptomatic but completely ambulatory  Sclerae unicteric, EOMs intact Wearing a mask No cervical or supraclavicular adenopathy Lungs no rales or rhonchi Heart regular rate and rhythm Abd soft, nontender, positive bowel sounds MSK no focal spinal tenderness, no upper extremity lymphedema Neuro: nonfocal, well oriented, appropriate affect Breasts: The right breast is status postlumpectomy and radiation.  There is no evidence of local recurrence.  The left breast and both axillae are benign.  LAB RESULTS:  CMP     Component Value Date/Time   NA 138 04/23/2021 0800   K 4.2 04/23/2021 0800   CL 107 04/23/2021 0800   CO2 24 04/23/2021 0800   GLUCOSE 181 (H) 04/23/2021 0800   BUN 13 04/23/2021 0800   CREATININE 0.74 04/23/2021 0800   CREATININE 0.75 04/24/2020 1035   CALCIUM 9.2 04/23/2021 0800   PROT 6.3 (L) 04/23/2021 0800   ALBUMIN 3.3 (L) 04/23/2021 0800   AST 27 04/23/2021 0800   AST 23 04/24/2020 1035   ALT 33 04/23/2021 0800   ALT 20 04/24/2020 1035   ALKPHOS 86 04/23/2021 0800   BILITOT 0.7 04/23/2021 0800   BILITOT 1.1 04/24/2020 1035   GFRNONAA >60 04/23/2021 0800   GFRNONAA >60 04/24/2020 1035   GFRAA >60 04/24/2020 1035    No results found for: TOTALPROTELP, ALBUMINELP, A1GS, A2GS, BETS, BETA2SER, GAMS, MSPIKE, SPEI  No results found for: KPAFRELGTCHN, LAMBDASER, KAPLAMBRATIO  Lab Results  Component Value Date   WBC 6.5 05/13/2021   NEUTROABS 4.6 05/13/2021   HGB 12.9 05/13/2021   HCT 37.9 05/13/2021   MCV 84.0 05/13/2021   PLT 192 05/13/2021   No results found for: LABCA2  No components found for: BEMLJQ492  No results for input(s): INR in the last 168 hours.  No results found for: LABCA2  No results found for: CAN199  No results found for: CAN125  No results found for: EFE071  Lab Results  Component Value Date   CA2729 40.8 (H) 04/23/2021     No components found for: HGQUANT  Lab Results  Component Value Date   CEA1 2.45 10/29/2020   /  CEA ([facility])  Date Value Ref Range Status  10/29/2020 2.45 0.00 - 5.00 ng/mL Final    Comment:    (NOTE) This test was performed using Architect's Chemiluminescent Microparticle Immunoassay. Values obtained from different assay methods cannot be used interchangeably. Please note that 5-10% of patients who smoke may see CEA levels up to 6.9 ng/mL. Performed at [facility], [place] [address] [place], [place] [address]      No results found for: AFPTUMOR  No results found for: [place]  No results found for: HGBA, HGBA2QUANT, HGBFQUANT, HGBSQUAN (Hemoglobinopathy evaluation)   Lab Results  Component Value Date   LDH 170 09/30/2019    No results found for: IRON, TIBC, IRONPCTSAT (Iron and TIBC)  Lab Results  Component Value Date   FERRITIN 498 (H) 10/19/2019    Urinalysis    Component Value Date/Time   COLORURINE YELLOW 08/28/2019 2326   APPEARANCEUR CLOUDY (A) 08/28/2019 2326   LABSPEC 1.013 08/28/2019 2326   PHURINE 5.0 08/28/2019 2326   GLUCOSEU NEGATIVE 08/28/2019 2326   HGBUR MODERATE (A) 08/28/2019 2326   BILIRUBINUR NEGATIVE 08/28/2019 2326   KETONESUR NEGATIVE 08/28/2019 2326   PROTEINUR NEGATIVE 08/28/2019 2326   NITRITE NEGATIVE 08/28/2019 2326   LEUKOCYTESUR MODERATE (A) 08/28/2019 2326    STUDIES: MR LIVER W WO CONTRAST  Result Date: 04/30/2021 CLINICAL DATA:  Breast cancer metastatic to liver, assess treatment response. EXAM: MRI ABDOMEN WITHOUT AND WITH CONTRAST TECHNIQUE: Multiplanar multisequence MR imaging of the abdomen was performed both before and after the administration of intravenous contrast. CONTRAST:  35m GADAVIST GADOBUTROL 1 MMOL/ML IV SOLN COMPARISON:  Multiple priors including most recent MRI abdomen February 06, 2021 FINDINGS: Lower chest: No acute abnormality. Hepatobiliary: Hypoenhancing bilobar  hepatic metastases are again visualized, the majority of which now demonstrate a rim  of perilesional enhancement with corresponded reduced diffusivity. No new suspicious lesions visualized. The index lesions are as follows: -subcapsular anterior hepatic segment VIII lesion measures 2.5 x 1.6 cm on image 29/19 previously 2.7 x 2.2 cm. -anterior liver dome hepatic segment VII lesion measures 3.9 x 1.7 cm on image 23/19 previously 3.9 x 1.9 cm Pancreas: No mass, inflammatory changes, or other parenchymal abnormality identified. Spleen:  Within normal limits in size and appearance. Adrenals/Urinary Tract: No masses identified. No evidence of hydronephrosis. Stomach/Bowel: Visualized portions within the abdomen are unremarkable. Vascular/Lymphatic: No pathologically enlarged lymph nodes identified. No abdominal aortic aneurysm demonstrated. Other:  No abdominal ascites. Musculoskeletal: No suspicious bone lesions identified. IMPRESSION: Bilobar hepatic metastases are grossly stable in size. However, majority of which now demonstrate a rim of perilesional enhancement with associated reduced diffusivity, which is nonspecific and may reflect pseudo enhancement related to treatment effect verses disease progression. No new suspicious hepatic lesions or abdominal adenopathy visualized. Recommend attention on close interval follow-up MRI. Electronically Signed   By: Dahlia Bailiff MD   On: 04/30/2021 13:59     ELIGIBLE FOR AVAILABLE RESEARCH PROTOCOL: AET  ASSESSMENT: 50 y.o. [place], [place] woman status post right breast upper outer quadrant biopsy 05/01/2019 for a clinical T1c N1, stage IIA invasive ductal carcinoma, grade 3, estrogen and progesterone receptor positive, HER-2 nonamplified, with an MIB-1-1 of 20%.  (a) mass in the axillary tail was a positive lymph node  (1) MammaPrint obtained from the original biopsy shows a high risk luminal B tumor and predicts a 5-year disease-free survival of 91% in her  case  (2) genetics testing 05/09/2019 through the Common Hereditary Gene Panel offered by [facility] found no deleterious mutations in APC, ATM, AXIN2, BARD1, BMPR1A, BRCA1, BRCA2, BRIP1, CDH1, CDK4, CDKN2A (p14ARF), CDKN2A (p16INK4a), CHEK2, CTNNA1, DICER1, EPCAM (Deletion/duplication testing only), GREM1 (promoter region deletion/duplication testing only), KIT, MEN1, MLH1, MSH2, MSH3, MSH6, MUTYH, NBN, NF1, NHTL1, PALB2, PDGFRA, PMS2, POLD1, POLE, PTEN, RAD50, RAD51C, RAD51D, RNF43, SDHB, SDHC, SDHD, SMAD4, SMARCA4. STK11, TP53, TSC1, TSC2, and VHL.  The following genes were evaluated for sequence changes only: SDHA and HOXB13 c.251G>A variant only.   (a) A variant of uncertain significance (VUS) was detected in one of her MSH6 genes (c.831A>C).   (3) status post right lumpectomy and sentinel lymph node sampling 06/12/2019 for a pT2 pN1, stage IIA invasive ductal carcinoma, grade 2, with positive margins  (a) a total of 4 sentinel lymph nodes removed, one positive (with ECE), ine itc  (b) margin clearance 04/19/2019 successful (medial margin close but negative for DCIS)   (4) adjuvant chemotherapy consisting of doxorubicin and cyclophosphamide in dose dense fashion x4 starting 07/10/2019, completed 09/24/2019; planned weekly paclitaxel x12 omitted   (a) echo 06/26/2019 shows an EF of 60-65%  (b echo on 09/19/2019 shows an EF of 60-65%  (c) chemotherapy stopped after four cycles of doxorubicin and cyclophosphamide due to #5 below  (5) Multiple VTE/PE documented 10/02/2019, on intravenous heparin initially, then Xarelto started 10/19/2019  (a) presented with SVC syndrome and bilateral pulmonary emboli on 09/28/2019  (b) s/p SVC thrombectomy and port removal 03/88/8280 complicated by hemopericardium and tamponade, necessitating pericardial window placement   (c) postop course complicated by wound dehiscence requiring wound VAC  (c) Doppler 10/03/2019 showed right lower extremity DVT  (d) CT angio and  Doppler of both upper extremities 10/13/2019 found persistent acute bilateral pulmonary emboli, persistent but improved SVC thrombus, bilateral pleural effusions, and right lower lobe collapse. Bilateral upper extremity DVTs  also noted  (e) Dopplers upper extremity 11/19/2019 showed clots cleared on the left, chronic DVT right internal jugular and axillary veins  (f) CT angio of the chest 10/07/2020 shows resolution of earlier pleural effusions and no evidence of active embolism (but see #9 below)  (6) COVID-19 infection documented 09/27/2020, status post remdesivir   (7) adjuvant radiation 12/24/19 - 02/06/20  Site/dose:   The patient initially received a dose of 50.4 Gy in 28 fractions to the breast and SCLV region using a 4-field approach. This was delivered using a 3-D conformal technique. The patient then received a boost to the seroma. This delivered an additional 10 Gy in 5 fractions using a 3-field photon boost technique. The total dose was 60.4 Gy.   (8) anastrozole started mid May 2021  (a) [facility] and estradiol obtained 11/15/2019 consistent with menopause  (b) not a good candidate for tamoxifen given history of DVT/PE above  (c) bone density 07/31/2020 normal (T score equals 0.1).  METASTATIC DISEASE: Jan 2022 (9) CT angio of the chest 12/28/2021shows new liver lesions (not seen on CT angio January 2021)  (a) MRI 10/22/2020 shows multiple liver lesions, the largest 2.5 cm  (b)   liver biopsy 11/03/2020 confirms metastatic carcinoma estrogen receptor 10% positive with weak staining intensity, HER-2 and progesterone receptor negative.  (c) bone scan 11/25/2020 shows left femoral lesion as well as other lesions   (i) left hip films not suggestive of impending pathologic fracture  (d) non-contrast head CT 11/25/2020 negative except for sinusitis  (e) CA 27-29 is informative (was 104.1 on 10/29/2020)  (10) foundation 1 study requested on 11/03/2020 liver biopsy dated 11/03/2020 shows PI K3 CA  amplification, and a T p53 mutation.  There were no mutations in BRCA1 and BRCA2 or HER-2.  There is amplification of PRK CI, S0X2, T ERC, and FGF 1 2.  The microsatellite status is stable.  The tumor mutational burden is 4 mutations per Mb  (11) started paclitaxel 11/19/2018 2012, repeated day 1 day 8 of every 21-day cycle, stopped after 04/23/2021 dose with continuing response  (a) pembrolizumab added 12/17/2020, repeated every 21 days  (b) MRI liver 01/21/2021 (after 3 cycles) shows significant response  (c) repeat liver MRI 04/30/2021 shows essentially stable disease  (12) zoledronate added 12/10/2020, repeated every 12 weeks    PLAN:  Emberley has had a good response to her treatment and her disease is now well controlled.  She is not having any symptoms related to the cancer itself.  She has some discomfort in the right axilla which is really postsurgical and chronic.  She has tolerated the Taxol well, with very minimal neuropathy symptoms involving only the middle digits of the right hand and only intermittently.  Nevertheless we are holding Taxol now while continuing the Keytruda to see if we can maintain her response without chemotherapy.  We will be following her CA 27-29 and we will repeat a liver MRI within 3 months.  Her TSH is improved but not yet normalized.  I am increasing her Synthroid to 100 mcg daily and we will continue to follow this closely to normalization.  I have encouraged her to call us with any unusual symptoms.  I encouraged her also to continue her walking program while being careful regarding sun exposure.  She will return in 3 weeks for Keytruda and Zometa and she will see me again 6 weeks from now.  Total encounter time 35 minutes.Sarajane Jews C. Lynore Coscia, MD Medical Oncology and  Hematology [facility] [place], [place] [address] Tel. [phone]    Fax. [phone]   I, Wilburn Mylar, am acting as scribe for Dr. Virgie Dad.  Avante Carneiro.  I, Lurline Del MD, have reviewed the above documentation for accuracy and completeness, and I agree with the above.   *Total Encounter Time as defined by the Centers for Medicare and Medicaid Services includes, in addition to the face-to-face time of a patient visit (documented in the note above) non-face-to-face time: obtaining and reviewing outside history, ordering and reviewing medications, tests or procedures, care coordination (communications with other health care professionals or caregivers) and documentation in the medical record.

## 2021-05-13 ENCOUNTER — Inpatient Hospital Stay: Payer: 59

## 2021-05-13 ENCOUNTER — Inpatient Hospital Stay: Payer: 59 | Attending: Oncology

## 2021-05-13 ENCOUNTER — Other Ambulatory Visit: Payer: Self-pay

## 2021-05-13 ENCOUNTER — Ambulatory Visit: Payer: 59 | Admitting: Hematology and Oncology

## 2021-05-13 ENCOUNTER — Inpatient Hospital Stay (HOSPITAL_BASED_OUTPATIENT_CLINIC_OR_DEPARTMENT_OTHER): Payer: 59 | Admitting: Oncology

## 2021-05-13 VITALS — BP 120/74 | HR 75 | Temp 97.4°F | Resp 18 | Ht 61.0 in | Wt 230.9 lb

## 2021-05-13 DIAGNOSIS — C50411 Malignant neoplasm of upper-outer quadrant of right female breast: Secondary | ICD-10-CM | POA: Diagnosis not present

## 2021-05-13 DIAGNOSIS — M899 Disorder of bone, unspecified: Secondary | ICD-10-CM | POA: Diagnosis not present

## 2021-05-13 DIAGNOSIS — C787 Secondary malignant neoplasm of liver and intrahepatic bile duct: Secondary | ICD-10-CM

## 2021-05-13 DIAGNOSIS — Z170 Estrogen receptor positive status [ER+]: Secondary | ICD-10-CM

## 2021-05-13 DIAGNOSIS — C7951 Secondary malignant neoplasm of bone: Secondary | ICD-10-CM

## 2021-05-13 DIAGNOSIS — Z95828 Presence of other vascular implants and grafts: Secondary | ICD-10-CM

## 2021-05-13 DIAGNOSIS — Z5112 Encounter for antineoplastic immunotherapy: Secondary | ICD-10-CM | POA: Diagnosis not present

## 2021-05-13 DIAGNOSIS — Z7189 Other specified counseling: Secondary | ICD-10-CM

## 2021-05-13 DIAGNOSIS — Z79899 Other long term (current) drug therapy: Secondary | ICD-10-CM | POA: Insufficient documentation

## 2021-05-13 LAB — CBC WITH DIFFERENTIAL (CANCER CENTER ONLY)
Abs Immature Granulocytes: 0.13 10*3/uL — ABNORMAL HIGH (ref 0.00–0.07)
Basophils Absolute: 0.1 10*3/uL (ref 0.0–0.1)
Basophils Relative: 1 %
Eosinophils Absolute: 0.1 10*3/uL (ref 0.0–0.5)
Eosinophils Relative: 2 %
HCT: 37.9 % (ref 36.0–46.0)
Hemoglobin: 12.9 g/dL (ref 12.0–15.0)
Immature Granulocytes: 2 %
Lymphocytes Relative: 15 %
Lymphs Abs: 1 10*3/uL (ref 0.7–4.0)
MCH: 28.6 pg (ref 26.0–34.0)
MCHC: 34 g/dL (ref 30.0–36.0)
MCV: 84 fL (ref 80.0–100.0)
Monocytes Absolute: 0.7 10*3/uL (ref 0.1–1.0)
Monocytes Relative: 10 %
Neutro Abs: 4.6 10*3/uL (ref 1.7–7.7)
Neutrophils Relative %: 70 %
Platelet Count: 192 10*3/uL (ref 150–400)
RBC: 4.51 MIL/uL (ref 3.87–5.11)
RDW: 16.1 % — ABNORMAL HIGH (ref 11.5–15.5)
WBC Count: 6.5 10*3/uL (ref 4.0–10.5)
nRBC: 0 % (ref 0.0–0.2)

## 2021-05-13 LAB — CMP (CANCER CENTER ONLY)
ALT: 29 U/L (ref 0–44)
AST: 23 U/L (ref 15–41)
Albumin: 3.7 g/dL (ref 3.5–5.0)
Alkaline Phosphatase: 87 U/L (ref 38–126)
Anion gap: 10 (ref 5–15)
BUN: 16 mg/dL (ref 6–20)
CO2: 22 mmol/L (ref 22–32)
Calcium: 9.5 mg/dL (ref 8.9–10.3)
Chloride: 107 mmol/L (ref 98–111)
Creatinine: 0.81 mg/dL (ref 0.44–1.00)
GFR, Estimated: >60 mL/min (ref >60)
Glucose, Bld: 168 mg/dL — ABNORMAL HIGH (ref 70–99)
Potassium: 4.2 mmol/L (ref 3.5–5.1)
Sodium: 139 mmol/L (ref 135–145)
Total Bilirubin: 1.1 mg/dL (ref 0.3–1.2)
Total Protein: 6.6 g/dL (ref 6.5–8.1)

## 2021-05-13 MED ORDER — SODIUM CHLORIDE 0.9% FLUSH
10.0000 mL | INTRAVENOUS | Status: AC | PRN
  Administered 2021-05-13: 10 mL
  Filled 2021-05-13: qty 10

## 2021-05-13 MED ORDER — LEVOTHYROXINE SODIUM 100 MCG PO TABS: 100.0000 ug | ORAL_TABLET | Freq: Every day | ORAL | 6 refills | Status: DC

## 2021-05-13 MED ORDER — SODIUM CHLORIDE 0.9 % IV SOLN
Freq: Once | INTRAVENOUS | Status: AC
Start: 2021-05-13 — End: 2021-05-13
  Filled 2021-05-13: qty 250

## 2021-05-13 MED ORDER — SODIUM CHLORIDE 0.9% FLUSH
10.0000 mL | INTRAVENOUS | Status: DC | PRN
  Administered 2021-05-13: 10 mL
  Filled 2021-05-13: qty 10

## 2021-05-13 MED ORDER — HEPARIN SOD (PORK) LOCK FLUSH 100 UNIT/ML IV SOLN
500.0000 [IU] | Freq: Once | INTRAVENOUS | Status: AC | PRN
  Administered 2021-05-13: 500 [IU]
  Filled 2021-05-13: qty 5

## 2021-05-13 MED ORDER — SODIUM CHLORIDE 0.9 % IV SOLN
200.0000 mg | Freq: Once | INTRAVENOUS | Status: AC
  Administered 2021-05-13: 200 mg via INTRAVENOUS
  Filled 2021-05-13: qty 8

## 2021-05-13 NOTE — Patient Instructions (Signed)
[facility]  Discharge Instructions: Thank you for choosing [facility] to provide your oncology and hematology care.   If you have a lab appointment with the Cancer Center, please go directly to the Cancer Center and check in at the registration area.   Wear comfortable clothing and clothing appropriate for easy access to any Portacath or PICC line.   We strive to give you quality time with your provider. You may need to reschedule your appointment if you arrive late (15 or more minutes).  Arriving late affects you and other patients whose appointments are after yours.  Also, if you miss three or more appointments without notifying the office, you may be dismissed from the clinic at the provider's discretion.      For prescription refill requests, have your pharmacy contact our office and allow 72 hours for refills to be completed.    Today you received the following chemotherapy and/or immunotherapy agents: keytruda      To help prevent nausea and vomiting after your treatment, we encourage you to take your nausea medication as directed.  BELOW ARE SYMPTOMS THAT SHOULD BE REPORTED IMMEDIATELY: *FEVER GREATER THAN 100.4 F (38 C) OR HIGHER *CHILLS OR SWEATING *NAUSEA AND VOMITING THAT IS NOT CONTROLLED WITH YOUR NAUSEA MEDICATION *UNUSUAL SHORTNESS OF BREATH *UNUSUAL BRUISING OR BLEEDING *URINARY PROBLEMS (pain or burning when urinating, or frequent urination) *BOWEL PROBLEMS (unusual diarrhea, constipation, pain near the anus) TENDERNESS IN MOUTH AND THROAT WITH OR WITHOUT PRESENCE OF ULCERS (sore throat, sores in mouth, or a toothache) UNUSUAL RASH, SWELLING OR PAIN  UNUSUAL VAGINAL DISCHARGE OR ITCHING   Items with * indicate a potential emergency and should be followed up as soon as possible or go to the Emergency Department if any problems should occur.  Please show the CHEMOTHERAPY ALERT CARD or IMMUNOTHERAPY ALERT CARD at check-in to  the Emergency Department and triage nurse.  Should you have questions after your visit or need to cancel or reschedule your appointment, please contact [facility]  Dept: [phone]  and follow the prompts.  Office hours are 8:00 a.m. to 4:30 p.m. Monday - Friday. Please note that voicemails left after 4:00 p.m. may not be returned until the following business day.  We are closed weekends and major holidays. You have access to a nurse at all times for urgent questions. Please call the main number to the clinic Dept: [phone] and follow the prompts.   For any non-urgent questions, you may also contact your provider using MyChart. We now offer e-Visits for anyone 18 and older to request care online for non-urgent symptoms. For details visit [email or web address].   Also download the MyChart app! Go to the app store, search "MyChart", open the app, select [facility], and log in with your MyChart username and password.  Due to Covid, a mask is required upon entering the hospital/clinic. If you do not have a mask, one will be given to you upon arrival. For doctor visits, patients may have 1 support person aged 18 or older with them. For treatment visits, patients cannot have anyone with them due to current Covid guidelines and our immunocompromised population.   

## 2021-05-14 LAB — THYROID PANEL WITH TSH
Free Thyroxine Index: 2.7 (ref 1.2–4.9)
T3 Uptake Ratio: 25 % (ref 24–39)
T4, Total: 10.9 ug/dL (ref 4.5–12.0)
TSH: 18.8 u[IU]/mL — ABNORMAL HIGH (ref 0.450–4.500)

## 2021-05-14 LAB — CANCER ANTIGEN 27.29: CA 27.29: 50.3 U/mL — ABNORMAL HIGH (ref 0.0–38.6)

## 2021-06-03 ENCOUNTER — Inpatient Hospital Stay: Payer: 59

## 2021-06-03 ENCOUNTER — Other Ambulatory Visit: Payer: Self-pay

## 2021-06-03 VITALS — BP 112/79 | HR 72 | Temp 97.9°F | Resp 17 | Wt 230.5 lb

## 2021-06-03 DIAGNOSIS — C50411 Malignant neoplasm of upper-outer quadrant of right female breast: Secondary | ICD-10-CM

## 2021-06-03 DIAGNOSIS — Z5112 Encounter for antineoplastic immunotherapy: Secondary | ICD-10-CM | POA: Diagnosis not present

## 2021-06-03 DIAGNOSIS — C787 Secondary malignant neoplasm of liver and intrahepatic bile duct: Secondary | ICD-10-CM

## 2021-06-03 DIAGNOSIS — Z95828 Presence of other vascular implants and grafts: Secondary | ICD-10-CM

## 2021-06-03 DIAGNOSIS — Z170 Estrogen receptor positive status [ER+]: Secondary | ICD-10-CM

## 2021-06-03 DIAGNOSIS — Z7189 Other specified counseling: Secondary | ICD-10-CM

## 2021-06-03 LAB — CBC WITH DIFFERENTIAL (CANCER CENTER ONLY)
Abs Immature Granulocytes: 0.03 10*3/uL (ref 0.00–0.07)
Basophils Absolute: 0 10*3/uL (ref 0.0–0.1)
Basophils Relative: 1 %
Eosinophils Absolute: 0.3 10*3/uL (ref 0.0–0.5)
Eosinophils Relative: 4 %
HCT: 39 % (ref 36.0–46.0)
Hemoglobin: 13.1 g/dL (ref 12.0–15.0)
Immature Granulocytes: 0 %
Lymphocytes Relative: 12 %
Lymphs Abs: 0.9 10*3/uL (ref 0.7–4.0)
MCH: 28.4 pg (ref 26.0–34.0)
MCHC: 33.6 g/dL (ref 30.0–36.0)
MCV: 84.4 fL (ref 80.0–100.0)
Monocytes Absolute: 0.6 10*3/uL (ref 0.1–1.0)
Monocytes Relative: 8 %
Neutro Abs: 5.8 10*3/uL (ref 1.7–7.7)
Neutrophils Relative %: 75 %
Platelet Count: 194 10*3/uL (ref 150–400)
RBC: 4.62 MIL/uL (ref 3.87–5.11)
RDW: 14 % (ref 11.5–15.5)
WBC Count: 7.7 10*3/uL (ref 4.0–10.5)
nRBC: 0 % (ref 0.0–0.2)

## 2021-06-03 LAB — CMP (CANCER CENTER ONLY)
ALT: 70 U/L — ABNORMAL HIGH (ref 0–44)
AST: 58 U/L — ABNORMAL HIGH (ref 15–41)
Albumin: 3.5 g/dL (ref 3.5–5.0)
Alkaline Phosphatase: 173 U/L — ABNORMAL HIGH (ref 38–126)
Anion gap: 9 (ref 5–15)
BUN: 16 mg/dL (ref 6–20)
CO2: 23 mmol/L (ref 22–32)
Calcium: 9.5 mg/dL (ref 8.9–10.3)
Chloride: 105 mmol/L (ref 98–111)
Creatinine: 0.77 mg/dL (ref 0.44–1.00)
GFR, Estimated: >60 mL/min (ref >60)
Glucose, Bld: 176 mg/dL — ABNORMAL HIGH (ref 70–99)
Potassium: 3.9 mmol/L (ref 3.5–5.1)
Sodium: 137 mmol/L (ref 135–145)
Total Bilirubin: 1.3 mg/dL — ABNORMAL HIGH (ref 0.3–1.2)
Total Protein: 6.7 g/dL (ref 6.5–8.1)

## 2021-06-03 LAB — TSH: TSH: 4.599 u[IU]/mL — ABNORMAL HIGH (ref 0.308–3.960)

## 2021-06-03 MED ORDER — SODIUM CHLORIDE 0.9 % IV SOLN: Freq: Once | INTRAVENOUS | Status: AC

## 2021-06-03 MED ORDER — SODIUM CHLORIDE 0.9 % IV SOLN
200.0000 mg | Freq: Once | INTRAVENOUS | Status: AC
  Administered 2021-06-03: 200 mg via INTRAVENOUS
  Filled 2021-06-03: qty 8

## 2021-06-03 MED ORDER — SODIUM CHLORIDE 0.9% FLUSH
10.0000 mL | INTRAVENOUS | Status: AC | PRN
  Administered 2021-06-03: 10 mL

## 2021-06-03 MED ORDER — HEPARIN SOD (PORK) LOCK FLUSH 100 UNIT/ML IV SOLN
500.0000 [IU] | Freq: Once | INTRAVENOUS | Status: AC | PRN
  Administered 2021-06-03: 500 [IU]

## 2021-06-03 MED ORDER — ZOLEDRONIC ACID 4 MG/100ML IV SOLN
4.0000 mg | Freq: Once | INTRAVENOUS | Status: AC
  Administered 2021-06-03: 4 mg via INTRAVENOUS
  Filled 2021-06-03: qty 100

## 2021-06-03 MED ORDER — SODIUM CHLORIDE 0.9% FLUSH
10.0000 mL | INTRAVENOUS | Status: DC | PRN
  Administered 2021-06-03: 10 mL

## 2021-06-03 NOTE — Patient Instructions (Addendum)
[facility]  Discharge Instructions: Thank you for choosing [facility] to provide your oncology and hematology care.   If you have a lab appointment with the [facility], please go directly to the [facility] and check in at the registration area.   Wear comfortable clothing and clothing appropriate for easy access to any Portacath or PICC line.   We strive to give you quality time with your provider. You may need to reschedule your appointment if you arrive late (15 or more minutes).  Arriving late affects you and other patients whose appointments are after yours.  Also, if you miss three or more appointments without notifying the office, you may be dismissed from the clinic at the provider's discretion.      For prescription refill requests, have your pharmacy contact our office and allow 72 hours for refills to be completed.    Today you received the following chemotherapy and/or immunotherapy agents: Keytruda.      To help prevent nausea and vomiting after your treatment, we encourage you to take your nausea medication as directed.  BELOW ARE SYMPTOMS THAT SHOULD BE REPORTED IMMEDIATELY: *FEVER GREATER THAN 100.4 F (38 C) OR HIGHER *CHILLS OR SWEATING *NAUSEA AND VOMITING THAT IS NOT CONTROLLED WITH YOUR NAUSEA MEDICATION *UNUSUAL SHORTNESS OF BREATH *UNUSUAL BRUISING OR BLEEDING *URINARY PROBLEMS (pain or burning when urinating, or frequent urination) *BOWEL PROBLEMS (unusual diarrhea, constipation, pain near the anus) TENDERNESS IN MOUTH AND THROAT WITH OR WITHOUT PRESENCE OF ULCERS (sore throat, sores in mouth, or a toothache) UNUSUAL RASH, SWELLING OR PAIN  UNUSUAL VAGINAL DISCHARGE OR ITCHING   Items with * indicate a potential emergency and should be followed up as soon as possible or go to the Emergency Department if any problems should occur.  Please show the CHEMOTHERAPY ALERT CARD or IMMUNOTHERAPY ALERT CARD at check-in to  the Emergency Department and triage nurse.  Should you have questions after your visit or need to cancel or reschedule your appointment, please contact [facility]  Dept: [phone]  and follow the prompts.  Office hours are 8:00 a.m. to 4:30 p.m. Monday - Friday. Please note that voicemails left after 4:00 p.m. may not be returned until the following business day.  We are closed weekends and major holidays. You have access to a nurse at all times for urgent questions. Please call the main number to the clinic Dept: [phone] and follow the prompts.   For any non-urgent questions, you may also contact your provider using MyChart. We now offer e-Visits for anyone 48 and older to request care online for non-urgent symptoms. For details visit [email or web address].   Also download the MyChart app! Go to the app store, search "MyChart", open the app, select [facility], and log in with your MyChart username and password.  Due to Covid, a mask is required upon entering the hospital/clinic. If you do not have a mask, one will be given to you upon arrival. For doctor visits, patients may have 1 support person aged 36 or older with them. For treatment visits, patients cannot have anyone with them due to current Covid guidelines and our immunocompromised population.   Zoledronic Acid Injection (Hypercalcemia, Oncology) What is this medication? ZOLEDRONIC ACID (ZOE le dron ik AS id) slows calcium loss from bones. It high calcium levels in the blood from some kinds of cancer. It may be used in otherpeople at risk for bone loss. This medicine may be  used for other purposes; ask your health care provider orpharmacist if you have questions. COMMON BRAND NAME(S): Zometa What should I tell my care team before I take this medication? They need to know if you have any of these conditions: cancer dehydration dental disease kidney disease liver disease low levels of calcium in  the blood lung or breathing disease (asthma) receiving steroids like dexamethasone or prednisone an unusual or allergic reaction to zoledronic acid, other medicines, foods, dyes, or preservatives pregnant or trying to get pregnant breast-feeding How should I use this medication? This drug is injected into a vein. It is given by a health care provider in [employer] or clinic setting. Talk to your health care provider about the use of this drug in children.Special care may be needed. Overdosage: If you think you have taken too much of this medicine contact apoison control center or emergency room at once. NOTE: This medicine is only for you. Do not share this medicine with others. What if I miss a dose? Keep appointments for follow-up doses. It is important not to miss your dose.Call your health care provider if you are unable to keep an appointment. What may interact with this medication? certain antibiotics given by injection NSAIDs, medicines for pain and inflammation, like ibuprofen or naproxen some diuretics like bumetanide, furosemide teriparatide thalidomide This list may not describe all possible interactions. Give your health care provider a list of all the medicines, herbs, non-prescription drugs, or dietary supplements you use. Also tell them if you smoke, drink alcohol, or use illegaldrugs. Some items may interact with your medicine. What should I watch for while using this medication? Visit your health care provider for regular checks on your progress. It may besome time before you see the benefit from this drug. Some people who take this drug have severe bone, joint, or muscle pain. This drug may also increase your risk for jaw problems or a broken thigh bone. Tell your health care provider right away if you have severe pain in your jaw, bones, joints, or muscles. Tell you health care provider if you have any painthat does not go away or that gets worse. Tell your dentist and dental  surgeon that you are taking this drug. You should not have major dental surgery while on this drug. See your dentist to have a dental exam and fix any dental problems before starting this drug. Take good care of your teeth while on this drug. Make sure you see your dentist forregular follow-up appointments. You should make sure you get enough calcium and vitamin D while you are taking this drug. Discuss the foods you eat and the vitamins you take with your healthcare provider. Check with your health care provider if you have severe diarrhea, nausea, and vomiting, or if you sweat a lot. The loss of too much body fluid may make itdangerous for you to take this drug. You may need blood work done while you are taking this drug. Do not become pregnant while taking this drug. Women should inform their health care provider if they wish to become pregnant or think they might be pregnant. There is potential for serious harm to an unborn child. Talk to your healthcare provider for more information. What side effects may I notice from receiving this medication? Side effects that you should report to your doctor or health care provider assoon as possible: allergic reactions (skin rash, itching or hives; swelling of the face, lips, or tongue) bone pain infection (fever, chills, cough, sore  throat, pain or trouble passing urine) jaw pain, especially after dental work joint pain kidney injury (trouble passing urine or change in the amount of urine) low blood pressure (dizziness; feeling faint or lightheaded, falls; unusually weak or tired) low calcium levels (fast heartbeat; muscle cramps or pain; pain, tingling, or numbness in the hands or feet; seizures) low magnesium levels (fast, irregular heartbeat; muscle cramp or pain; muscle weakness; tremors; seizures) low red blood cell counts (trouble breathing; feeling faint; lightheaded, falls; unusually weak or tired) muscle pain redness, blistering, peeling, or  loosening of the skin, including inside the mouth severe diarrhea swelling of the ankles, feet, hands trouble breathing Side effects that usually do not require medical attention (report to yourdoctor or health care provider if they continue or are bothersome): anxious constipation coughing depressed mood eye irritation, itching, or pain fever general ill feeling or flu-like symptoms nausea pain, redness, or irritation at site where injected trouble sleeping This list may not describe all possible side effects. Call your doctor for medical advice about side effects. You may report side effects to FDA at1-800-FDA-1088. Where should I keep my medication? This drug is given in a hospital or clinic. It will not be stored at home. NOTE: This sheet is a summary. It may not cover all possible information. If you have questions about this medicine, talk to your doctor, pharmacist, orhealth care provider.  2022 [employer] Standard (2019-07-12 09:13:00)

## 2021-06-04 LAB — T4: T4, Total: 10.6 ug/dL (ref 4.5–12.0)

## 2021-06-04 LAB — CANCER ANTIGEN 27.29: CA 27.29: 60.3 U/mL — ABNORMAL HIGH (ref 0.0–38.6)

## 2021-06-09 ENCOUNTER — Other Ambulatory Visit: Payer: Self-pay | Admitting: *Deleted

## 2021-06-09 MED ORDER — LEVOTHYROXINE SODIUM 100 MCG PO TABS: 100.0000 ug | ORAL_TABLET | Freq: Every day | ORAL | 6 refills | Status: AC

## 2021-06-21 NOTE — Progress Notes (Signed)
[place]  [phone] [phone]    ID: Veronica [name] DOB: 1971/07/15  MR#: [id number]  ENI#:[id number]  Patient Care Team: Jamie Kato as PCP - General (Family Medicine) Mauro Kaufmann, RN as Oncology Nurse Navigator [name], RN as Oncology Nurse Navigator Erroll Luna, MD as Consulting Physician (General Surgery) Yates Weisgerber, Virgie Dad, MD as Consulting Physician (Oncology) Kyung Rudd, MD as Consulting Physician (Radiation Oncology) Gaye Pollack, MD as Consulting Physician (Cardiothoracic Surgery) Serafina Mitchell, MD as Consulting Physician (Vascular Surgery) Benedict Needy, MD as Referring Physician (Hematology and Oncology) Chauncey Cruel, MD OTHER MD:  CHIEF COMPLAINT: weakly estrogen receptor positive (functionally triple negative) metastatic breast cancer  CURRENT TREATMENT: pembrolizumab given Q 21 days; zoledronate every 12 weeks; rivaroxaban   INTERVAL HISTORY: Veronica [name] returns today for follow-up and treatment of her metastatic breast cancer.    To review: she underwent repeat liver MRI on 04/29/2021 showing: bilobar hepatic metastases grossly stable in size (though all 3 measurable lesions show a decrease of about 2 mm). Her paclitaxel was held and she coninutes om pembrolizumab maintenance with a dose due today.  Her last zoledronate was 06/03/2021  Next dose will be in November  We are following her CA 27-29  Results for Veronica [name], Veronica [name] (MRN [id number]) as of 06/21/2021 20:26  Ref. Range 03/12/2021 07:37 03/25/2021 07:41 04/23/2021 08:00 05/13/2021 08:39 06/03/2021 08:35  CA 27.29 Latest Ref Range: 0.0 - 38.6 U/mL 32.3 32.5 40.8 (H) 50.3 (H) 60.3 (H)   We are also following her TSH.  Results for Veronica [name], Veronica [name] (MRN [id number]) as of 06/21/2021 20:26  Ref. Range 03/12/2021 07:37 03/25/2021 07:41 04/23/2021 08:00 05/13/2021 08:39 06/03/2021 08:35  TSH Latest Ref Range: 0.308 - 3.960 uIU/mL 26.522 (H) 32.685 (H)  18.191 (H) 18.800 (H) 4.599 (H)    REVIEW OF SYSTEMS: Veronica [name] tells me she is doing "good".  The other night she had some pain on the right flank.  It was very well localized so she feels it was more like a muscle or bone rather than the liver itself.  She changed positions and it got better.  That pain comes and goes but its not been persistent or increasing.  She has good appetite normal taste no nausea.  Her energy is good and she has normal activity at present.  Detailed review of systems today was otherwise stable  COVID 19 VACCINATION STATUS: refuses vaccination, had COVID 29 September 2019   HISTORY OF CURRENT ILLNESS: From the original intake note:  Veronica [name] had routine screening mammography on 04/27/2019 showing a possible abnormality in the right breast. She underwent right diagnostic mammography with tomography and right breast ultrasonography at [facility] on 05/01/2019 showing: breast density category C; highly suspicious 1.7 cm irregular mass in the right breast at 10 o'clock, 8 cm from the nipple; indeterminate hypoechoic round mass in the right axillary tail/low axilla. Physical exam showed no suspicious lumps.  Accordingly on 05/02/2019 she proceeded to biopsy of the right breast area in question. The pathology from this procedure ([id number]) showed: invasive ductal carcinoma, grade 3; ductal carcinoma in situ; lymphovascular invasion present. Prognostic indicators significant for: estrogen receptor, 30% positive with moderate staining intensity, and progesterone receptor, 30% positive with strong staining intensity. Proliferation marker Ki67 at 20%. HER2 negative by immunohistochemistry (1+).  The second "satellite" lesion was a lymph node which was also positive.  The patient's subsequent history is as detailed below.  PAST MEDICAL HISTORY: Past Medical History:  Diagnosis Date   Cancer ([place]) 2020   breast Cancer, 2022 liver mets   Chronic low back pain with  left-sided sciatica    Family history of leukemia    Family history of stomach cancer    Hypertension    Personal history of chemotherapy    Personal history of radiation therapy    finished june '21    PAST SURGICAL HISTORY: Past Surgical History:  Procedure Laterality Date   APPLICATION OF WOUND VAC N/A 10/16/2019   Procedure: APPLICATION OF WOUND VAC;  Surgeon: Gaye Pollack, MD;  Location: [facility];  Service: Vascular;  Laterality: N/A;   BREAST BIOPSY Right 05/02/2019   right clips X2   BREAST LUMPECTOMY Right    06/2019   BREAST LUMPECTOMY WITH RADIOACTIVE SEED AND SENTINEL LYMPH NODE BIOPSY Right 06/12/2019   Procedure: RIGHT BREAST RADIOACTIVE SEED LUMPECTOMY  AND RIGHT AXILLARY SEED TARGETED LYMPH NODE AND AXILLARY SENTINEL LYMPH NODE Mariano Colon;  Surgeon: Erroll Luna, MD;  Location: [facility];  Service: General;  Laterality: Right;  PEC BLOCK   C/S x2  '98 '[place]  10/02/2019   Procedure: Insertion Central Line Adult;  Surgeon: Serafina Mitchell, MD;  Location: [facility];  Service: Vascular;;   CESAREAN SECTION     CHOLECYSTECTOMY     I & D EXTREMITY N/A 10/16/2019   Procedure: DEBRIDEMENT of DEHISCED MIDLINE SURGICAL INCISION;  Surgeon: Gaye Pollack, MD;  Location: [facility];  Service: Vascular;  Laterality: N/A;   IR IMAGING GUIDED PORT INSERTION  11/24/2020   LAPAROSCOPIC ASSISTED VAGINAL HYSTERECTOMY  05/17/2011   Procedure: LAPAROSCOPIC ASSISTED VAGINAL HYSTERECTOMY;  Surgeon: Sharene Butters;  Location: [facility];  Service: Gynecology;  Laterality: N/A;  Laparoscopic Assisted Vaginal Hysterectomy With Lysis Of Adhesions   PERICARDIAL WINDOW N/A 10/02/2019   Procedure: Pericardial Window;  Surgeon: Serafina Mitchell, MD;  Location: [facility];  Service: Vascular;  Laterality: N/A;   PORT-A-CATH REMOVAL  10/02/2019   Procedure: Removal Port-A-Cath;  Surgeon: Serafina Mitchell, MD;  Location: [facility];  Service: Vascular;;   PORTACATH PLACEMENT  Right 06/12/2019   Procedure: INSERTION PORT-A-CATH WITH ULTRASOUND;  Surgeon: Erroll Luna, MD;  Location: [facility];  Service: General;  Laterality: Right;   RE-EXCISION OF BREAST LUMPECTOMY Right 07/17/2019   Procedure: RE-EXCISION OF RIGHT BREAST LUMPECTOMY;  Surgeon: Erroll Luna, MD;  Location: [facility];  Service: General;  Laterality: Right;   TUBAL LIGATION     ULTRASOUND GUIDANCE FOR VASCULAR ACCESS  10/02/2019   Procedure: Ultrasound Guidance For Vascular Access;  Surgeon: Serafina Mitchell, MD;  Location[facility];  Service: Vascular;;   VENOGRAM N/A 10/02/2019   Procedure: Central VENOGRAM;  Surgeon: Serafina Mitchell, MD;  [facility];  Service: Vascular;  Laterality: N/A;    FAMILY HISTORY: Family History  Problem Relation Age of Onset   Arthritis Mother    COPD Mother    Hypertension Mother    Hyperlipidemia Mother    Leukemia Brother 48   Stomach cancer Maternal Grandmother        late 51s  Patient's parents are living (as of September 2020). Her father is 76 and her mother is 35 as of 04/2019. The patient denies a family hx of breast or ovarian cancer. She has 7 siblings, 6 brothers and 1 sister. She reports her maternal grandmother was diagnosed with stomach cancer at  age 51. Her brother was diagnosed with leukemia at age 84.   GYNECOLOGIC HISTORY:  No LMP recorded. Patient has had a hysterectomy. Menarche: 50 years old Age at first live birth: 50 years old GX P 2 (+1 stillborn) LMP age 89-42 Contraceptive: unsure, maybe for a year at age 44. HRT no Hysterectomy? Yes, 05/2011 BSO? 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 She is married. Husband Sheppard Plumber") is a Hydrologist for a telephone company. She lives at home with her husband and two daughters. Daughter Suszanne Conners, age 38 is a Scientist, water quality. Daughter Jarrett Soho, age 4, is a Ship broker.  She graduated from high school June 2022 and has  a Actor, hoping to become an Therapist, sports    ADVANCED DIRECTIVES: Husband Veronica [name] is her HCPOA.   HEALTH MAINTENANCE: Social History   Tobacco Use   Smoking status: Never   Smokeless tobacco: Never  Vaping Use   Vaping Use: Never used  Substance Use Topics   Alcohol use: Not Currently    Alcohol/week: 2.0 standard drinks    Types: 2 Standard drinks or equivalent per week    Comment: "2-3 a week"   Drug use: No     Colonoscopy: n/a  PAP: 02/2018  Bone density: n/a   No Known Allergies  Current Outpatient Medications  Medication Sig Dispense Refill   Cholecalciferol (VITAMIN D3) 25 MCG (1000 UT) CHEW Chew 1 tablet by mouth daily.      ketoconazole (NIZORAL) 2 % cream Apply 1 application topically daily. 15 g 4   levothyroxine (SYNTHROID) 100 MCG tablet Take 1 tablet (100 mcg total) by mouth daily before breakfast. 30 tablet 6   metoprolol tartrate (LOPRESSOR) 25 MG tablet Take 1 tablet (25 mg total) by mouth 2 (two) times daily. 60 tablet 0   rivaroxaban (XARELTO) 20 MG TABS tablet Take 1 tablet (20 mg total) by mouth daily with supper. 90 tablet 3   No current facility-administered medications for this visit.   Facility-Administered Medications Ordered in Other Visits  Medication Dose Route Frequency Provider Last Rate Last Admin   sodium chloride flush (NS) 0.9 % injection 10 mL  10 mL Intravenous PRN Paulanthony Gleaves, Virgie Dad, MD   10 mL at 03/04/21 0935    OBJECTIVE: white woman who appears stated age  52:   06/22/21 0951  BP: (!) 117/57  Pulse: 65  Resp: 20  Temp: (!) 97.5 F (36.4 C)  SpO2: 100%    Wt Readings from Last 3 Encounters:  06/22/21 230 lb 1.6 oz (104.4 kg)  06/03/21 230 lb 8 oz (104.6 kg)  05/13/21 230 lb 14.4 oz (104.7 kg)   Body mass index is 43.48 kg/m.    ECOG FS:1 - Symptomatic but completely ambulatory  Sclerae unicteric, EOMs intact Wearing a mask No cervical or supraclavicular adenopathy Lungs no rales or rhonchi Heart regular  rate and rhythm Abd soft, nontender including over the right upper quadrant,, positive bowel sounds MSK no focal spinal tenderness, no upper extremity lymphedema Neuro: nonfocal, well oriented, appropriate affect Breasts: Deferred  LAB RESULTS:  CMP     Component Value Date/Time   NA 137 06/22/2021 1049   K 4.2 06/22/2021 1049   CL 103 06/22/2021 1049   CO2 23 06/22/2021 1049   GLUCOSE 160 (H) 06/22/2021 1049   BUN 11 06/22/2021 1049   CREATININE 0.77 06/22/2021 1049   CALCIUM 9.8 06/22/2021 1049   PROT 7.2 06/22/2021 1049   ALBUMIN  3.5 06/22/2021 1049   AST 89 (H) 06/22/2021 1049   ALT 108 (H) 06/22/2021 1049   ALKPHOS 585 (H) 06/22/2021 1049   BILITOT 1.1 06/22/2021 1049   GFRNONAA >60 06/22/2021 1049   GFRAA >60 04/24/2020 1035    No results found for: TOTALPROTELP, ALBUMINELP, A1GS, A2GS, BETS, BETA2SER, GAMS, MSPIKE, SPEI  No results found for: KPAFRELGTCHN, LAMBDASER, [facility]  Lab Results  Component Value Date   WBC 7.2 06/22/2021   NEUTROABS 5.4 06/22/2021   HGB 13.8 06/22/2021   HCT 41.0 06/22/2021   MCV 84.2 06/22/2021   PLT 218 06/22/2021   No results found for: LABCA2  No components found for: KZSWFU932  No results for input(s): INR in the last 168 hours.  No results found for: LABCA2  No results found for: CAN199  No results found for: CAN125  No results found for: TFT732  Lab Results  Component Value Date   CA2729 60.3 (H) 06/03/2021    No components found for: HGQUANT  Lab Results  Component Value Date   CEA1 2.45 10/29/2020   /  CEA ([place])  Date Value Ref Range Status  10/29/2020 2.45 0.00 - 5.00 ng/mL Final    Comment:    (NOTE) This test was performed using Architect's Chemiluminescent Microparticle Immunoassay. Values obtained from different assay methods cannot be used interchangeably. Please note that 5-10% of patients who smoke may see CEA levels up to 6.9 ng/mL. Performed at [facility]  [facility], [place] [address] [place], [place] [address]      No results found for: AFPTUMOR  No results found for: [place]  No results found for: HGBA, HGBA2QUANT, HGBFQUANT, HGBSQUAN (Hemoglobinopathy evaluation)   Lab Results  Component Value Date   LDH 170 09/30/2019    No results found for: IRON, TIBC, IRONPCTSAT (Iron and TIBC)  Lab Results  Component Value Date   FERRITIN 498 (H) 10/19/2019    Urinalysis    Component Value Date/Time   COLORURINE YELLOW 08/28/2019 2326   APPEARANCEUR CLOUDY (A) 08/28/2019 2326   LABSPEC 1.013 08/28/2019 2326   PHURINE 5.0 08/28/2019 2326   GLUCOSEU NEGATIVE 08/28/2019 2326   HGBUR MODERATE (A) 08/28/2019 2326   BILIRUBINUR NEGATIVE 08/28/2019 2326   [place] 08/28/2019 2326   PROTEINUR NEGATIVE 08/28/2019 2326   NITRITE NEGATIVE 08/28/2019 2326   LEUKOCYTESUR MODERATE (A) 08/28/2019 2326    STUDIES: No results found.   ELIGIBLE FOR AVAILABLE RESEARCH PROTOCOL: AET  ASSESSMENT: 50 y.o. [place], [place] woman status post right breast upper outer quadrant biopsy 05/01/2019 for a clinical T1c N1, stage IIA invasive ductal carcinoma, grade 3, estrogen and progesterone receptor positive, HER-2 nonamplified, with an MIB-1-1 of 20%.  (a) mass in the axillary tail was a positive lymph node  (1) MammaPrint obtained from the original biopsy shows a high risk luminal B tumor and predicts a 5-year disease-free survival of 91% in her case  (2) genetics testing 05/09/2019 through the Common Hereditary Gene Panel offered by [facility] found no deleterious mutations in APC, ATM, AXIN2, BARD1, BMPR1A, BRCA1, BRCA2, BRIP1, CDH1, CDK4, CDKN2A (p14ARF), CDKN2A (p16INK4a), CHEK2, CTNNA1, DICER1, EPCAM (Deletion/duplication testing only), GREM1 (promoter region deletion/duplication testing only), KIT, MEN1, MLH1, MSH2, MSH3, MSH6, MUTYH, NBN, NF1, NHTL1, PALB2, PDGFRA, PMS2, POLD1, POLE, PTEN, RAD50, RAD51C, RAD51D, RNF43, SDHB, SDHC, SDHD,  SMAD4, SMARCA4. STK11, TP53, TSC1, TSC2, and VHL.  The following genes were evaluated for sequence changes only: SDHA and HOXB13 c.251G>A variant only.   (a) A variant  of uncertain significance (VUS) was detected in one of her MSH6 genes (c.831A>C).   (3) status post right lumpectomy and sentinel lymph node sampling 06/12/2019 for a pT2 pN1, stage IIA invasive ductal carcinoma, grade 2, with positive margins  (a) a total of 4 sentinel lymph nodes removed, one positive (with ECE), ine itc  (b) margin clearance 04/19/2019 successful (medial margin close but negative for DCIS)   (4) adjuvant chemotherapy consisting of doxorubicin and cyclophosphamide in dose dense fashion x4 starting 07/10/2019, completed 09/24/2019; planned weekly paclitaxel x12 omitted   (a) echo 06/26/2019 shows an EF of 60-65%  (b echo on 09/19/2019 shows an EF of 60-65%  (c) chemotherapy stopped after four cycles of doxorubicin and cyclophosphamide due to #5 below  (5) Multiple VTE/PE documented 10/02/2019, on intravenous heparin initially, then Xarelto started 10/19/2019  (a) presented with SVC syndrome and bilateral pulmonary emboli on 09/28/2019  (b) s/p SVC thrombectomy and port removal 09/62/8366 complicated by hemopericardium and tamponade, necessitating pericardial window placement   (c) postop course complicated by wound dehiscence requiring wound VAC  (c) Doppler 10/03/2019 showed right lower extremity DVT  (d) CT angio and Doppler of both upper extremities 10/13/2019 found persistent acute bilateral pulmonary emboli, persistent but improved SVC thrombus, bilateral pleural effusions, and right lower lobe collapse. Bilateral upper extremity DVTs also noted  (e) Dopplers upper extremity 11/19/2019 showed clots cleared on the left, chronic DVT right internal jugular and axillary veins  (f) CT angio of the chest 10/07/2020 shows resolution of earlier pleural effusions and no evidence of active embolism (but see #9  below)  (6) COVID-19 infection documented 09/27/2020, status post remdesivir   (7) adjuvant radiation 12/24/19 - 02/06/20  Site/dose:   The patient initially received a dose of 50.4 Gy in 28 fractions to the breast and SCLV region using a 4-field approach. This was delivered using a 3-D conformal technique. The patient then received a boost to the seroma. This delivered an additional 10 Gy in 5 fractions using a 3-field photon boost technique. The total dose was 60.4 Gy.   (8) anastrozole started mid May 2021  (a) [facility] and estradiol obtained 11/15/2019 consistent with menopause  (b) not a good candidate for tamoxifen given history of DVT/PE above  (c) bone density 07/31/2020 normal (T score equals 0.1).  METASTATIC DISEASE: Jan 2022 (9) CT angio of the chest 12/28/2021shows new liver lesions (not seen on CT angio January 2021)  (a) MRI 10/22/2020 shows multiple liver lesions, the largest 2.5 cm  (b)   liver biopsy 11/03/2020 confirms metastatic carcinoma estrogen receptor 10% weakly positive, HER-2 and progesterone receptor negative (functionally triple negative metastatic breast cancer).  (c) bone scan 11/25/2020 shows left femoral lesion as well as other lesions   (i) left hip films not suggestive of impending pathologic fracture  (d) non-contrast head CT 11/25/2020 negative except for sinusitis  (e) CA 27-29 is informative (was 104.1 on 10/29/2020)  (10) foundation 1 study requested on 11/03/2020 liver biopsy dated 11/03/2020 shows PI K3 CA amplification, and a T p53 mutation.  There were no mutations in BRCA1 and BRCA2 or HER-2.  There is amplification of PRK CI, S0X2, T ERC, and FGF 1 2.  The microsatellite status is stable.  The tumor mutational burden is 4 mutations per Mb  (11) started paclitaxel 11/19/2020, repeated day 1 day 8 of every 21-day cycle, stopped after 04/23/2021 dose with continuing response  (a) pembrolizumab added 12/17/2020, repeated every 21 days, ongoing  (b) MRI  liver 01/21/2021 (after 3 cycles) shows significant response  (c) repeat liver MRI 04/30/2021 shows essentially stable disease  (12) zoledronate added 12/10/2020, repeated every 12 weeks    PLAN:  Demetri is tolerating the pembrolizumab moderately well well but her liver function tests are increased.  There has been a drift upward on her CA 27-29. I think it would be prudent to repeat her liver MRI sooner rather than later so we are holding her treatment today and proceeding to the liver MRI hopefully within the next week.  I asked her to call me if she has not heard from them by or 15 2022  Otherwise I commended her walking program which is excellent.  She is having no bleeding or bruising problems from the rivaroxaban.  The repeat thyroid studies today are pending but they were normalizing when last checked.    She knows to call us for any other issue that may develop before the next visit.  Total encounter time 25 minutes.Sarajane Jews C. Drae Mitzel, MD Medical Oncology and Hematology [facility] [place], [place] [address] Tel. [phone]    Fax. [phone]   I, Wilburn Mylar, am acting as scribe for Dr. Virgie Dad. Hyatt Capobianco.  I, Lurline Del MD, have reviewed the above documentation for accuracy and completeness, and I agree with the above.   *Total Encounter Time as defined by the Centers for Medicare and Medicaid Services includes, in addition to the face-to-face time of a patient visit (documented in the note above) non-face-to-face time: obtaining and reviewing outside history, ordering and reviewing medications, tests or procedures, care coordination (communications with other health care professionals or caregivers) and documentation in the medical record.

## 2021-06-22 ENCOUNTER — Other Ambulatory Visit: Payer: Self-pay

## 2021-06-22 ENCOUNTER — Other Ambulatory Visit: Payer: Self-pay | Admitting: *Deleted

## 2021-06-22 ENCOUNTER — Inpatient Hospital Stay: Payer: 59

## 2021-06-22 ENCOUNTER — Inpatient Hospital Stay: Payer: 59 | Attending: Oncology

## 2021-06-22 ENCOUNTER — Telehealth: Payer: Self-pay | Admitting: *Deleted

## 2021-06-22 ENCOUNTER — Inpatient Hospital Stay (HOSPITAL_BASED_OUTPATIENT_CLINIC_OR_DEPARTMENT_OTHER): Payer: 59 | Admitting: Oncology

## 2021-06-22 VITALS — BP 117/57 | HR 65 | Temp 97.5°F | Resp 20 | Wt 230.1 lb

## 2021-06-22 DIAGNOSIS — C50411 Malignant neoplasm of upper-outer quadrant of right female breast: Secondary | ICD-10-CM

## 2021-06-22 DIAGNOSIS — Z79899 Other long term (current) drug therapy: Secondary | ICD-10-CM | POA: Diagnosis not present

## 2021-06-22 DIAGNOSIS — Z170 Estrogen receptor positive status [ER+]: Secondary | ICD-10-CM

## 2021-06-22 DIAGNOSIS — M899 Disorder of bone, unspecified: Secondary | ICD-10-CM | POA: Diagnosis not present

## 2021-06-22 DIAGNOSIS — Z7189 Other specified counseling: Secondary | ICD-10-CM | POA: Diagnosis not present

## 2021-06-22 DIAGNOSIS — Z95828 Presence of other vascular implants and grafts: Secondary | ICD-10-CM

## 2021-06-22 DIAGNOSIS — K769 Liver disease, unspecified: Secondary | ICD-10-CM | POA: Insufficient documentation

## 2021-06-22 DIAGNOSIS — C787 Secondary malignant neoplasm of liver and intrahepatic bile duct: Secondary | ICD-10-CM | POA: Insufficient documentation

## 2021-06-22 DIAGNOSIS — R971 Elevated cancer antigen 125 [CA 125]: Secondary | ICD-10-CM | POA: Insufficient documentation

## 2021-06-22 DIAGNOSIS — C7951 Secondary malignant neoplasm of bone: Secondary | ICD-10-CM

## 2021-06-22 LAB — CMP (CANCER CENTER ONLY)
ALT: 108 U/L — ABNORMAL HIGH (ref 0–44)
AST: 89 U/L — ABNORMAL HIGH (ref 15–41)
Albumin: 3.5 g/dL (ref 3.5–5.0)
Alkaline Phosphatase: 585 U/L — ABNORMAL HIGH (ref 38–126)
Anion gap: 11 (ref 5–15)
BUN: 11 mg/dL (ref 6–20)
CO2: 23 mmol/L (ref 22–32)
Calcium: 9.8 mg/dL (ref 8.9–10.3)
Chloride: 103 mmol/L (ref 98–111)
Creatinine: 0.77 mg/dL (ref 0.44–1.00)
GFR, Estimated: >60 mL/min
Glucose, Bld: 160 mg/dL — ABNORMAL HIGH (ref 70–99)
Potassium: 4.2 mmol/L (ref 3.5–5.1)
Sodium: 137 mmol/L (ref 135–145)
Total Bilirubin: 1.1 mg/dL (ref 0.3–1.2)
Total Protein: 7.2 g/dL (ref 6.5–8.1)

## 2021-06-22 LAB — CBC WITH DIFFERENTIAL (CANCER CENTER ONLY)
Abs Immature Granulocytes: 0.02 10*3/uL (ref 0.00–0.07)
Basophils Absolute: 0 10*3/uL (ref 0.0–0.1)
Basophils Relative: 1 %
Eosinophils Absolute: 0.2 10*3/uL (ref 0.0–0.5)
Eosinophils Relative: 3 %
HCT: 41 % (ref 36.0–46.0)
Hemoglobin: 13.8 g/dL (ref 12.0–15.0)
Immature Granulocytes: 0 %
Lymphocytes Relative: 12 %
Lymphs Abs: 0.9 10*3/uL (ref 0.7–4.0)
MCH: 28.3 pg (ref 26.0–34.0)
MCHC: 33.7 g/dL (ref 30.0–36.0)
MCV: 84.2 fL (ref 80.0–100.0)
Monocytes Absolute: 0.6 10*3/uL (ref 0.1–1.0)
Monocytes Relative: 8 %
Neutro Abs: 5.4 10*3/uL (ref 1.7–7.7)
Neutrophils Relative %: 76 %
Platelet Count: 218 10*3/uL (ref 150–400)
RBC: 4.87 MIL/uL (ref 3.87–5.11)
RDW: 12.8 % (ref 11.5–15.5)
WBC Count: 7.2 10*3/uL (ref 4.0–10.5)
nRBC: 0 % (ref 0.0–0.2)

## 2021-06-22 LAB — TSH: TSH: 2.725 u[IU]/mL (ref 0.308–3.960)

## 2021-06-22 MED ORDER — SODIUM CHLORIDE 0.9% FLUSH
10.0000 mL | Freq: Once | INTRAVENOUS | Status: AC
  Administered 2021-06-22: 10 mL

## 2021-06-22 MED ORDER — HEPARIN SOD (PORK) LOCK FLUSH 100 UNIT/ML IV SOLN
500.0000 [IU] | Freq: Once | INTRAVENOUS | Status: AC
  Administered 2021-06-22: 500 [IU]

## 2021-06-22 NOTE — Telephone Encounter (Signed)
Per MD review of labs today with noted elevated ALK phos, AST and ALT- recommended to hold keytruda today.  Order for restaging of liver with MRI will be ordered for next available for further treatment decisions.  Treatment room nurse notified of above.   

## 2021-06-23 LAB — CANCER ANTIGEN 27.29: CA 27.29: 126.2 U/mL — ABNORMAL HIGH (ref 0.0–38.6)

## 2021-06-23 LAB — T4: T4, Total: 11.5 ug/dL (ref 4.5–12.0)

## 2021-06-24 ENCOUNTER — Ambulatory Visit: Payer: 59

## 2021-06-24 ENCOUNTER — Ambulatory Visit: Payer: 59 | Admitting: Oncology

## 2021-06-24 ENCOUNTER — Other Ambulatory Visit: Payer: 59

## 2021-06-29 ENCOUNTER — Ambulatory Visit (INDEPENDENT_AMBULATORY_CARE_PROVIDER_SITE_OTHER): Payer: 59 | Admitting: Surgery

## 2021-06-29 ENCOUNTER — Other Ambulatory Visit: Payer: Self-pay

## 2021-06-29 ENCOUNTER — Encounter: Payer: Self-pay | Admitting: Surgery

## 2021-06-29 VITALS — BP 137/73 | HR 79 | Temp 97.7°F | Resp 20 | Ht 61.0 in | Wt 227.0 lb

## 2021-06-29 DIAGNOSIS — I82210 Acute embolism and thrombosis of superior vena cava: Secondary | ICD-10-CM

## 2021-06-29 NOTE — Progress Notes (Signed)
[facility]  Patient name: Veronica [name] MRN: [id number] DOB: 18-Aug-1971 Sex: female   REASON FOR VISIT:    Follow up  HISOTRY OF PRESENT ILLNESS:   Veronica [name] is a 50 y.o. female, who presented to the ER in December 2020 with facial and upper extremity swelling for several days.  CTA revealed  an occluded SVC.  She has a port in place for chemo for breast cancer.  She is currently ongoing treatment.  She tested positive for COVID.  She was saturating 100% on room air.  She was admitted and placed on IV heparin.  Her neck and face swelling improved with heparin however her arms remained edematous.   Because of her symptoms, I took her to the operating room on 10/02/2019 and perform mechanical thrombectomy of the right subclavian and brachiocephalic vein as well as the superior vena cava.  I also removed her right internal jugular Port-A-Cath.  She became somewhat unstable with hypotension.  I had a transesophageal echo in place and we noticed an enlarging pericardial effusion.  Dr. Cyndia Bent came in and performed an emergency pericardial window.  She has recovered nicely from her operation.    She remains on anticoagulation.  She no longer complains of arm face or neck swelling.  Unfortunately, her cancer has returned and is spread to her liver.  She is on immunotherapy and chemotherapy.   PAST MEDICAL HISTORY:   Past Medical History:  Diagnosis Date   Cancer ([place]) 2020   breast Cancer, 2022 liver mets   Chronic low back pain with left-sided sciatica    Family history of leukemia    Family history of stomach cancer    Hypertension    Personal history of chemotherapy    Personal history of radiation therapy    finished june '21     FAMILY HISTORY:   Family History  Problem Relation Age of Onset   Arthritis Mother    COPD Mother    Hypertension Mother    Hyperlipidemia Mother    Leukemia Brother 60   Stomach  cancer Maternal Grandmother        late 19s    SOCIAL HISTORY:   Social History   Tobacco Use   Smoking status: Never   Smokeless tobacco: Never  Substance Use Topics   Alcohol use: Not Currently    Alcohol/week: 2.0 standard drinks    Types: 2 Standard drinks or equivalent per week    Comment: "2-3 a week"     ALLERGIES:   No Known Allergies   CURRENT MEDICATIONS:   Current Outpatient Medications  Medication Sig Dispense Refill   Cholecalciferol (VITAMIN D3) 25 MCG (1000 UT) CHEW Chew 1 tablet by mouth daily.      ketoconazole (NIZORAL) 2 % cream Apply 1 application topically daily. 15 g 4   levothyroxine (SYNTHROID) 100 MCG tablet Take 1 tablet (100 mcg total) by mouth daily before breakfast. 30 tablet 6   rivaroxaban (XARELTO) 20 MG TABS tablet Take 1 tablet (20 mg total) by mouth daily with supper. 90 tablet 3   metoprolol tartrate (LOPRESSOR) 25 MG tablet Take 1 tablet (25 mg total) by mouth 2 (two) times daily. 60 tablet 0   No current facility-administered medications for this visit.   Facility-Administered Medications Ordered in Other Visits  Medication Dose Route Frequency Provider Last Rate Last Admin   sodium chloride flush (NS) 0.9 % injection 10 mL  10 mL Intravenous PRN Magrinat,  Virgie Dad, MD   10 mL at 03/04/21 0935    REVIEW OF SYSTEMS:   [X]  denotes positive finding, [ ]  denotes negative finding Cardiac  Comments:  Chest pain or chest pressure:    Shortness of breath upon exertion:    Short of breath when lying flat:    Irregular heart rhythm:        Vascular    Pain in calf, thigh, or hip brought on by ambulation:    Pain in feet at night that wakes you up from your sleep:     Blood clot in your veins:    Leg swelling:         Pulmonary    Oxygen at home:    Productive cough:     Wheezing:         Neurologic    Sudden weakness in arms or legs:     Sudden numbness in arms or legs:     Sudden onset of difficulty speaking or slurred  speech:    Temporary loss of vision in one eye:     Problems with dizziness:         Gastrointestinal    Blood in stool:     Vomited blood:         Genitourinary    Burning when urinating:     Blood in urine:        Psychiatric    Major depression:         Hematologic    Bleeding problems:    Problems with blood clotting too easily:        Skin    Rashes or ulcers:        Constitutional    Fever or chills:      PHYSICAL EXAM:   Vitals:   06/29/21 0922  BP: 137/73  Pulse: 79  Resp: 20  Temp: 97.7 F (36.5 C)  SpO2: 96%  Weight: 227 lb (103 kg)  Height: 5\' 1"  (1.549 m)    GENERAL: The patient is a well-nourished female, in no acute distress. The vital signs are documented above. CARDIAC: There is a regular rate and rhythm.  VASCULAR: No upper or lower extremity swelling PULMONARY: Non-labored respirations MUSCULOSKELETAL: There are no major deformities or cyanosis. NEUROLOGIC: No focal weakness or paresthesias are detected. SKIN: There are no ulcers or rashes noted. PSYCHIATRIC: The patient has a normal affect.  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  She remains on anticoagulation.  My recommendation is to keep her on anticoagulation as long as she is being treated for her malignancy.  She knows to contact me should she develop symptoms consistent with a DVT.  Otherwise I will see her back on an as-needed basis.    Leia Alf, MD, FACS [facility] [phone] Pager [phone]

## 2021-06-30 ENCOUNTER — Other Ambulatory Visit: Payer: Self-pay | Admitting: Oncology

## 2021-06-30 ENCOUNTER — Ambulatory Visit (HOSPITAL_COMMUNITY)
Admission: RE | Admit: 2021-06-30 | Discharge: 2021-06-30 | Disposition: A | Discharge status: Home / Self Care | Payer: 59 | Source: Ambulatory Visit | Attending: Oncology | Admitting: Oncology

## 2021-06-30 DIAGNOSIS — C787 Secondary malignant neoplasm of liver and intrahepatic bile duct: Secondary | ICD-10-CM | POA: Diagnosis present

## 2021-06-30 DIAGNOSIS — Z7189 Other specified counseling: Secondary | ICD-10-CM | POA: Insufficient documentation

## 2021-06-30 DIAGNOSIS — Z170 Estrogen receptor positive status [ER+]: Secondary | ICD-10-CM | POA: Diagnosis present

## 2021-06-30 DIAGNOSIS — C50411 Malignant neoplasm of upper-outer quadrant of right female breast: Secondary | ICD-10-CM | POA: Insufficient documentation

## 2021-06-30 DIAGNOSIS — C7951 Secondary malignant neoplasm of bone: Secondary | ICD-10-CM | POA: Insufficient documentation

## 2021-06-30 MED ORDER — GADOBUTROL 1 MMOL/ML IV SOLN
10.0000 mL | Freq: Once | INTRAVENOUS | Status: AC | PRN
  Administered 2021-06-30: 10 mL via INTRAVENOUS

## 2021-06-30 NOTE — Progress Notes (Signed)
I called Veronica [name] to give her the results of the MRI which unfortunately showed disease progression.  I have asked her to come and see me on [phone] to discuss that and make appropriate plans.

## 2021-07-01 NOTE — Progress Notes (Signed)
[place]  [phone] [phone]    ID: ERIE SICA DOB: [birth year]  MR#: [id number]  FSE#:[id number]  Patient Care Team: Jamie Kato as PCP - General (Family Medicine) Mauro Kaufmann, RN as Oncology Nurse Navigator [name], RN as Oncology Nurse Navigator Erroll Luna, MD as Consulting Physician (General Surgery) Ceci Taliaferro, Virgie Dad, MD as Consulting Physician (Oncology) Kyung Rudd, MD as Consulting Physician (Radiation Oncology) Gaye Pollack, MD as Consulting Physician (Cardiothoracic Surgery) Serafina Mitchell, MD as Consulting Physician (Vascular Surgery) Benedict Needy, MD as Referring Physician (Hematology and Oncology) Chauncey Cruel, MD OTHER MD:  CHIEF COMPLAINT: weakly estrogen receptor positive (functionally triple negative) metastatic breast cancer  CURRENT TREATMENT: pembrolizumab given Q 21 days; zoledronate every 12 weeks; rivaroxaban   INTERVAL HISTORY: Veronica [name] returns today for follow-up and treatment of her metastatic breast cancer.  She is accompanied by her husband Heath Lark  To review: she underwent repeat liver MRI on 04/29/2021 showing: bilobar hepatic metastases grossly stable in size (though all 3 measurable lesions show a decrease of about 2 mm). Her paclitaxel was held and she coninuted om pembrolizumab maintenance.  However with a rise in her CA 27-29 we restaged her 06/30/2021 with MRI of the liver and this showed "Marked interval worsening of disease in the liver with numerous new lesions and enlarging existing disease."  She is here today to discuss change in treatment.  NB: Her last zoledronate was 06/03/2021  Next dose will be in November  We are also following her TSH, which is normalizing:  Results for EARMA, Veronica [name] (MRN [id number]) as of 06/21/2021 20:26  Ref. Range 03/12/2021 07:37 03/25/2021 07:41 04/23/2021 08:00 05/13/2021 08:39 06/03/2021 08:35  TSH Latest Ref Range: 0.308 -  3.960 uIU/mL 26.522 (H) 32.685 (H) 18.191 (H) 18.800 (H) 4.599 (H)    REVIEW OF SYSTEMS: Jamar t is doing "okay".  She and Heath Lark took a trip recently to [place].  She has a little bit of a loss of appetite but no alteration of taste.  She has some discomfort in the right upper quadrant and the middle of the abdomen is teeny little bit also down the left side of the abdomen sometimes she says.  There has been no change in bowel or bladder habits.  She denies unusual headaches visual changes nausea or vomiting.  There have been no staggering or falls.  A detailed review of systems was otherwise stable  COVID 19 VACCINATION STATUS: refuses vaccination, had COVID 29 September 2019   HISTORY OF CURRENT ILLNESS: From the original intake note:  Veronica [name] had routine screening mammography on 04/27/2019 showing a possible abnormality in the right breast. She underwent right diagnostic mammography with tomography and right breast ultrasonography at [facility] on 05/01/2019 showing: breast density category C; highly suspicious 1.7 cm irregular mass in the right breast at 10 o'clock, 8 cm from the nipple; indeterminate hypoechoic round mass in the right axillary tail/low axilla. Physical exam showed no suspicious lumps.  Accordingly on 05/02/2019 she proceeded to biopsy of the right breast area in question. The pathology from this procedure ([id number]) showed: invasive ductal carcinoma, grade 3; ductal carcinoma in situ; lymphovascular invasion present. Prognostic indicators significant for: estrogen receptor, 30% positive with moderate staining intensity, and progesterone receptor, 30% positive with strong staining intensity. Proliferation marker Ki67 at 20%. HER2 negative by immunohistochemistry (1+).  The second "satellite" lesion was a lymph node which was also positive.  The patient's subsequent history is as detailed below.   PAST MEDICAL HISTORY: Past Medical History:  Diagnosis Date    Cancer ([place]) 2020   breast Cancer, 2022 liver mets   Chronic low back pain with left-sided sciatica    Family history of leukemia    Family history of stomach cancer    Hypertension    Personal history of chemotherapy    Personal history of radiation therapy    finished june '21    PAST SURGICAL HISTORY: Past Surgical History:  Procedure Laterality Date   APPLICATION OF WOUND VAC N/A 10/16/2019   Procedure: APPLICATION OF WOUND VAC;  Surgeon: Gaye Pollack, MD;  Location: [place];  Service: Vascular;  Laterality: N/A;   BREAST BIOPSY Right 05/02/2019   right clips X2   BREAST LUMPECTOMY Right    06/2019   BREAST LUMPECTOMY WITH RADIOACTIVE SEED AND SENTINEL LYMPH NODE BIOPSY Right 06/12/2019   Procedure: RIGHT BREAST RADIOACTIVE SEED LUMPECTOMY  AND RIGHT AXILLARY SEED TARGETED LYMPH NODE AND AXILLARY SENTINEL LYMPH NODE Junior;  Surgeon: Erroll Luna, MD;  Location: [place];  Service: General;  Laterality: Right;  PEC BLOCK   C/S x2  '98 '[place]  10/02/2019   Procedure: Insertion Central Line Adult;  Surgeon: Serafina Mitchell, MD;  Location: [place];  Service: Vascular;;   CESAREAN SECTION     CHOLECYSTECTOMY     I & D EXTREMITY N/A 10/16/2019   Procedure: DEBRIDEMENT of DEHISCED MIDLINE SURGICAL INCISION;  Surgeon: Gaye Pollack, MD;  Location: [place] [facility];  Service: Vascular;  Laterality: N/A;   IR IMAGING GUIDED PORT INSERTION  11/24/2020   LAPAROSCOPIC ASSISTED VAGINAL HYSTERECTOMY  05/17/2011   Procedure: LAPAROSCOPIC ASSISTED VAGINAL HYSTERECTOMY;  Surgeon: Sharene Butters;  Location: [facility];  Service: Gynecology;  Laterality: N/A;  Laparoscopic Assisted Vaginal Hysterectomy With Lysis Of Adhesions   PERICARDIAL WINDOW N/A 10/02/2019   Procedure: Pericardial Window;  Surgeon: Serafina Mitchell, MD;  Location: [place];  Service: Vascular;  Laterality: N/A;   PORT-A-CATH REMOVAL  10/02/2019   Procedure: Removal Port-A-Cath;  Surgeon:  Serafina Mitchell, MD;  Location: [place];  Service: Vascular;;   PORTACATH PLACEMENT Right 06/12/2019   Procedure: INSERTION PORT-A-CATH WITH ULTRASOUND;  Surgeon: Erroll Luna, MD;  Location: [place];  Service: General;  Laterality: Right;   RE-EXCISION OF BREAST LUMPECTOMY Right 07/17/2019   Procedure: RE-EXCISION OF RIGHT BREAST LUMPECTOMY;  Surgeon: Erroll Luna, MD;  Location: [place];  Service: General;  Laterality: Right;   TUBAL LIGATION     ULTRASOUND GUIDANCE FOR VASCULAR ACCESS  10/02/2019   Procedure: Ultrasound Guidance For Vascular Access;  Surgeon: Serafina Mitchell, MD;  Location: [facility];  Service: Vascular;;   VENOGRAM N/A 10/02/2019   Procedure: Central VENOGRAM;  Surgeon: Serafina Mitchell, MD;  Location: [facility];  Service: Vascular;  Laterality: N/A;    FAMILY HISTORY: Family History  Problem Relation Age of Onset   Arthritis Mother    COPD Mother    Hypertension Mother    Hyperlipidemia Mother    Leukemia Brother 14   Stomach cancer Maternal Grandmother        late 25s  Patient's parents are living (as of September 2020). Her father is 33 and her mother is 87 as of 04/2019. The patient denies a family hx of breast or ovarian cancer. She has 7 siblings, 6 brothers and 1 sister. She  reports her maternal grandmother was diagnosed with stomach cancer at age 62. Her brother was diagnosed with leukemia at age 21.   GYNECOLOGIC HISTORY:  No LMP recorded. Patient has had a hysterectomy. Menarche: 50 years old Age at first live birth: 50 years old GX P 2 (+1 stillborn) LMP age 38-42 Contraceptive: unsure, maybe for a year at age 26. HRT no Hysterectomy? Yes, 05/2011 BSO? 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 She is married. Husband Sheppard Plumber") is a Hydrologist for a telephone company. She lives at home with her husband and two daughters. Daughter Suszanne Conners, age 51 is a Scientist, water quality.  Daughter Jarrett Soho, age 50, is a Ship broker.  She graduated from high school June 6761 and has a Actor, hoping to become an Therapist, sports    ADVANCED DIRECTIVES: Husband Heath Lark is her HCPOA.   HEALTH MAINTENANCE: Social History   Tobacco Use   Smoking status: Never   Smokeless tobacco: Never  Vaping Use   Vaping Use: Never used  Substance Use Topics   Alcohol use: Not Currently    Alcohol/week: 2.0 standard drinks    Types: 2 Standard drinks or equivalent per week    Comment: "2-3 a week"   Drug use: No     Colonoscopy: n/a  PAP: 02/2018  Bone density: n/a   No Known Allergies  Current Outpatient Medications  Medication Sig Dispense Refill   Cholecalciferol (VITAMIN D3) 25 MCG (1000 UT) CHEW Chew 1 tablet by mouth daily.      dexamethasone (DECADRON) 4 MG tablet Take 2 tablets (8 mg) daily for 3 days after chemotherapy. Take with food. 30 tablet 1   ketoconazole (NIZORAL) 2 % cream Apply 1 application topically daily. 15 g 4   levothyroxine (SYNTHROID) 100 MCG tablet Take 1 tablet (100 mcg total) by mouth daily before breakfast. 30 tablet 6   loperamide (IMODIUM A-D) 2 MG tablet Take 2 at diarrhea onset, then 1 every 2hr until 12hrs with no BM. May take 2 every 4hrs at night. If diarrhea recurs repeat. 100 tablet 1   metoprolol tartrate (LOPRESSOR) 25 MG tablet Take 1 tablet (25 mg total) by mouth 2 (two) times daily. 60 tablet 0   prochlorperazine (COMPAZINE) 10 MG tablet Take 1 tablet (10 mg total) by mouth every 6 (six) hours as needed (Nausea or vomiting). 30 tablet 1   rivaroxaban (XARELTO) 20 MG TABS tablet Take 1 tablet (20 mg total) by mouth daily with supper. 90 tablet 3   No current facility-administered medications for this visit.   Facility-Administered Medications Ordered in Other Visits  Medication Dose Route Frequency Provider Last Rate Last Admin   sodium chloride flush (NS) 0.9 % injection 10 mL  10 mL Intravenous PRN Eboni Coval, Virgie Dad, MD   10 mL at 03/04/21 0935     OBJECTIVE: white woman who appears stated age  3:   07/02/21 1550  BP: (!) 157/91  Pulse: 70  Resp: 17  Temp: (!) 97.2 F (36.2 C)  SpO2: 100%     Wt Readings from Last 3 Encounters:  07/02/21 227 lb 12.8 oz (103.3 kg)  06/29/21 227 lb (103 kg)  06/22/21 230 lb 1.6 oz (104.4 kg)   Body mass index is 43.04 kg/m.    ECOG FS:1 - Symptomatic but completely ambulatory  Sclerae unicteric, EOMs intact Wearing a mask No cervical or supraclavicular adenopathy Lungs no rales or rhonchi Heart regular rate and rhythm Abd soft, mild  tenderness to deep palpation over the right upper quadrant, quiet bowel sounds MSK no focal spinal tenderness, no upper extremity lymphedema Neuro: nonfocal, well oriented, appropriate affect Breasts: Deferred  LAB RESULTS:  CMP     Component Value Date/Time   NA 138 07/02/2021 1533   K 4.1 07/02/2021 1533   CL 104 07/02/2021 1533   CO2 23 07/02/2021 1533   GLUCOSE 153 (H) 07/02/2021 1533   BUN 13 07/02/2021 1533   CREATININE 0.80 07/02/2021 1533   CREATININE 0.77 06/22/2021 1049   CALCIUM 10.1 07/02/2021 1533   PROT 7.2 07/02/2021 1533   ALBUMIN 3.3 (L) 07/02/2021 1533   AST 156 (H) 07/02/2021 1533   AST 89 (H) 06/22/2021 1049   ALT 144 (H) 07/02/2021 1533   ALT 108 (H) 06/22/2021 1049   ALKPHOS 904 (H) 07/02/2021 1533   BILITOT 0.8 07/02/2021 1533   BILITOT 1.1 06/22/2021 1049   GFRNONAA >60 07/02/2021 1533   GFRNONAA >60 06/22/2021 1049   GFRAA >60 04/24/2020 1035    No results found for: TOTALPROTELP, ALBUMINELP, A1GS, A2GS, BETS, BETA2SER, GAMS, MSPIKE, SPEI  No results found for: KPAFRELGTCHN, LAMBDASER, KAPLAMBRATIO  Lab Results  Component Value Date   WBC 6.3 07/02/2021   NEUTROABS 4.3 07/02/2021   HGB 13.7 07/02/2021   HCT 41.4 07/02/2021   MCV 83.3 07/02/2021   PLT 240 07/02/2021   No results found for: LABCA2  No components found for: RDEYCX448  No results for input(s): INR in the last 168 hours.  No  results found for: LABCA2  No results found for: CAN199  No results found for: JEH631  No results found for: SHF026  Lab Results  Component Value Date   CA2729 126.2 (H) 06/22/2021    No components found for: HGQUANT  Lab Results  Component Value Date   CEA1 2.45 10/29/2020   /  CEA ([facility])  Date Value Ref Range Status  10/29/2020 2.45 0.00 - 5.00 ng/mL Final    Comment:    (NOTE) This test was performed using Architect's Chemiluminescent Microparticle Immunoassay. Values obtained from different assay methods cannot be used interchangeably. Please note that 5-10% of patients who smoke may see CEA levels up to 6.9 ng/mL. Performed at [facility], [place] [address], [place], [place] [address]      No results found for: AFPTUMOR  No results found for: CHROMOGRNA  No results found for: HGBA, HGBA2QUANT, HGBFQUANT, HGBSQUAN (Hemoglobinopathy evaluation)   Lab Results  Component Value Date   LDH 170 09/30/2019    No results found for: IRON, TIBC, IRONPCTSAT (Iron and TIBC)  Lab Results  Component Value Date   FERRITIN 498 (H) 10/19/2019    Urinalysis    Component Value Date/Time   COLORURINE YELLOW 08/28/2019 2326   APPEARANCEUR CLOUDY (A) 08/28/2019 2326   LABSPEC 1.013 08/28/2019 2326   PHURINE 5.0 08/28/2019 2326   GLUCOSEU NEGATIVE 08/28/2019 2326   HGBUR MODERATE (A) 08/28/2019 2326   BILIRUBINUR NEGATIVE 08/28/2019 2326   KETONESUR NEGATIVE 08/28/2019 2326   PROTEINUR NEGATIVE 08/28/2019 2326   NITRITE NEGATIVE 08/28/2019 2326   LEUKOCYTESUR MODERATE (A) 08/28/2019 2326    STUDIES: MR LIVER W WO CONTRAST  Result Date: 06/30/2021 CLINICAL DATA:  A 50 year old female with history of breast cancer, assess treatment response. Found to have metastatic disease to the liver on previous imaging. EXAM: MRI ABDOMEN WITHOUT AND WITH CONTRAST TECHNIQUE: Multiplanar multisequence MR imaging of the abdomen was performed both  before and after  the administration of intravenous contrast. CONTRAST:  38m GADAVIST GADOBUTROL 1 MMOL/ML IV SOLN COMPARISON:  April 29, 2021. FINDINGS: Lower chest: No effusion. No consolidative changes. Limited assessment of the lung bases on MRI. Potential peripheral nodularity in the RIGHT chest may represent scarring is airways a similar focus seen on previous chest CT from December of 2021, this areas not well assessed on the MRI evaluation. Hepatobiliary: Interval worsening of disease in the liver common near confluent disease seen in the superior RIGHT hepatic lobe. Comparison with diffusion shows this to best advantage. Disease in hepatic subsegment IV and VIII in particular. Geographic area with peripheral satellite nodules as well. Dominant area measuring approximately 7.4 x 4.7 cm as compared to 2.9 x 2.7 cm (image 46/4) This is confluent with the lesion that was seen in the medial section of the LEFT hepatic lobe along the fissure for false form ligament which now measures approximately 5.0 x 4.0 cm as compared to 3.3 x 2.1 cm. Numerous new lesions throughout the liver, essentially too numerous to count also best displayed on diffusion-weighted imaging. Example of new hepatic lesion in hepatic subsegment II measuring approximately 2 cm greatest dimension not seen on the prior study. Precontrast T1 weighted imaging (series 17) also shows these numerous lesions with better definition even than postcontrast images. Background hepatic steatosis, mild. Post cholecystectomy without substantial biliary duct dilation. The portal vein remains patent. Pancreas: Normal intrinsic T1 signal. No ductal dilation or sign of inflammation. No focal lesion. Spleen:  Spleen normal size and contour. Adrenals/Urinary Tract: Adrenal glands are normal. No suspicious renal lesion. No hydronephrosis. No perinephric stranding. Stomach/Bowel: Unremarkable to the extent evaluated on abdominal MRI. Vascular/Lymphatic: No  pathologically enlarged lymph nodes identified. No abdominal aortic aneurysm demonstrated. Other:  No ascites. Musculoskeletal: No suspicious bone lesions identified. IMPRESSION: Marked interval worsening of disease in the liver with numerous new lesions and enlarging existing disease. Potential peripheral nodularity in the RIGHT chest may represent scarring is airways, a similar focus seen on previous chest CT from December of 2021, this area is not well assessed on the MRI evaluation. Consider attention on follow-up chest imaging. Electronically Signed   By: GZetta BillsM.D.   On: 06/30/2021 15:38     ELIGIBLE FOR AVAILABLE RESEARCH PROTOCOL: AET  ASSESSMENT: 50y.o. [place], [place] woman status post right breast upper outer quadrant biopsy 05/01/2019 for a clinical T1c N1, stage IIA invasive ductal carcinoma, grade 3, estrogen and progesterone receptor positive, HER-2 nonamplified, with an MIB-1-1 of 20%.  (a) mass in the axillary tail was a positive lymph node  (1) MammaPrint obtained from the original biopsy shows a high risk luminal B tumor and predicts a 5-year disease-free survival of 91% in her case  (2) genetics testing 05/09/2019 through the Common Hereditary Gene Panel offered by [facility] found no deleterious mutations in APC, ATM, AXIN2, BARD1, BMPR1A, BRCA1, BRCA2, BRIP1, CDH1, CDK4, CDKN2A (p14ARF), CDKN2A (p16INK4a), CHEK2, CTNNA1, DICER1, EPCAM (Deletion/duplication testing only), GREM1 (promoter region deletion/duplication testing only), KIT, MEN1, MLH1, MSH2, MSH3, MSH6, MUTYH, NBN, NF1, NHTL1, PALB2, PDGFRA, PMS2, POLD1, POLE, PTEN, RAD50, RAD51C, RAD51D, RNF43, SDHB, SDHC, SDHD, SMAD4, SMARCA4. STK11, TP53, TSC1, TSC2, and VHL.  The following genes were evaluated for sequence changes only: SDHA and HOXB13 c.251G>A variant only.   (a) A variant of uncertain significance (VUS) was detected in one of her MSH6 genes (c.831A>C).   (3) status post right lumpectomy and sentinel lymph node  sampling 06/12/2019 for a pT2 pN1, stage IIA  invasive ductal carcinoma, grade 2, with positive margins  (a) a total of 4 sentinel lymph nodes removed, one positive (with ECE), ine itc  (b) margin clearance 04/19/2019 successful (medial margin close but negative for DCIS)   (4) adjuvant chemotherapy consisting of doxorubicin and cyclophosphamide in dose dense fashion x4 starting 07/10/2019, completed 09/24/2019; planned weekly paclitaxel x12 omitted   (a) echo 06/26/2019 shows an EF of 60-65%  (b echo on 09/19/2019 shows an EF of 60-65%  (c) chemotherapy stopped after four cycles of doxorubicin and cyclophosphamide due to #5 below  (5) Multiple VTE/PE documented 10/02/2019, on intravenous heparin initially, then Xarelto started 10/19/2019  (a) presented with SVC syndrome and bilateral pulmonary emboli on 09/28/2019  (b) s/p SVC thrombectomy and port removal 40/98/1191 complicated by hemopericardium and tamponade, necessitating pericardial window placement   (c) postop course complicated by wound dehiscence requiring wound VAC  (c) Doppler 10/03/2019 showed right lower extremity DVT  (d) CT angio and Doppler of both upper extremities 10/13/2019 found persistent acute bilateral pulmonary emboli, persistent but improved SVC thrombus, bilateral pleural effusions, and right lower lobe collapse. Bilateral upper extremity DVTs also noted  (e) Dopplers upper extremity 11/19/2019 showed clots cleared on the left, chronic DVT right internal jugular and axillary veins  (f) CT angio of the chest 10/07/2020 shows resolution of earlier pleural effusions and no evidence of active embolism (but see #9 below)  (6) COVID-19 infection documented 09/27/2020, status post remdesivir   (7) adjuvant radiation 12/24/19 - 02/06/20  Site/dose:   The patient initially received a dose of 50.4 Gy in 28 fractions to the breast and SCLV region using a 4-field approach. This was delivered using a 3-D conformal technique. The patient  then received a boost to the seroma. This delivered an additional 10 Gy in 5 fractions using a 3-field photon boost technique. The total dose was 60.4 Gy.   (8) anastrozole started mid May 2021  (a) [facility] and estradiol obtained 11/15/2019 consistent with menopause  (b) not a good candidate for tamoxifen given history of DVT/PE above  (c) bone density 07/31/2020 normal (T score equals 0.1).  METASTATIC DISEASE: Jan 2022 (9) CT angio of the chest 12/28/2021shows new liver lesions (not seen on CT angio January 2021)  (a) MRI 10/22/2020 shows multiple liver lesions, the largest 2.5 cm  (b)   liver biopsy 11/03/2020 confirms metastatic carcinoma estrogen receptor 10% weakly positive, HER-2 and progesterone receptor negative (functionally triple negative metastatic breast cancer).  (c) bone scan 11/25/2020 shows left femoral lesion as well as other lesions   (i) left hip films not suggestive of impending pathologic fracture  (d) non-contrast head CT 11/25/2020 negative except for sinusitis  (e) CA 27-29 is informative (was 104.1 on 10/29/2020)  (10) foundation 1 study requested on 11/03/2020 liver biopsy dated 11/03/2020 shows PI K3 CA amplification, and a T p53 mutation.  There were no mutations in BRCA1 and BRCA2 or HER-2.  There is amplification of PRK CI, S0X2, T ERC, and FGF 1 2.  The microsatellite status is stable.  The tumor mutational burden is 4 mutations per Mb  (11) started paclitaxel 11/19/2020, repeated day 1 day 8 of every 21-day cycle, stopped after 04/23/2021 dose with continuing response  (a) pembrolizumab added 12/17/2020, repeated every 21 days, ongoing  (b) MRI liver 01/21/2021 (after 3 cycles) shows significant response  (c) repeat liver MRI 04/30/2021 shows essentially stable disease  (d) liver MRI 06/30/2021 shows significantly worsened liver involvement --pembrolizumab stopped  (12) zoledronate  added 12/10/2020, repeated every 12 weeks  (13) to start sacituzumab/Trudelvy  07/15/2021    PLAN:  Anaih's cancer has progressed on pembrolizumab.  We are discontinuing that medication.  She had a good response to the paclitaxel.  We certainly could go back to that.  However I am concerned that she will sooner rather than later develop neuropathy and we will have to make a change again, so I would prefer to go to sacituzumab govitecan.  We have good data comparing sacituzumab govitecan to physician's choice of chemotherapy and it was clearly superior.  We discussed the possible toxicities side effects and complications of this agent today but she is also going to meet with our clinical pharmacologist before she receives her first dose to go over side effects and how to manage them in more detail.  I am going to see her with a day 8 treatment.  Assuming she tolerates this well the plan would be to go through 4 cycles and then restage.  She knows to call us for any other issue that may develop before the next visit.  Total encounter time 25 minutes.Sarajane Jews C. Ralphie Lovelady, MD Medical Oncology and Hematology [facility] [place], [place] [address] Tel. [phone]    Fax. [phone]   I, Wilburn Mylar, am acting as scribe for Dr. Virgie Dad. Brack Shaddock.  I, Lurline Del MD, have reviewed the above documentation for accuracy and completeness, and I agree with the above.   *Total Encounter Time as defined by the Centers for Medicare and Medicaid Services includes, in addition to the face-to-face time of a patient visit (documented in the note above) non-face-to-face time: obtaining and reviewing outside history, ordering and reviewing medications, tests or procedures, care coordination (communications with other health care professionals or caregivers) and documentation in the medical record.

## 2021-07-02 ENCOUNTER — Inpatient Hospital Stay (HOSPITAL_BASED_OUTPATIENT_CLINIC_OR_DEPARTMENT_OTHER): Payer: 59 | Admitting: Oncology

## 2021-07-02 ENCOUNTER — Other Ambulatory Visit: Payer: Self-pay

## 2021-07-02 ENCOUNTER — Inpatient Hospital Stay: Payer: 59

## 2021-07-02 VITALS — BP 157/91 | HR 70 | Temp 97.2°F | Resp 17 | Wt 227.8 lb

## 2021-07-02 DIAGNOSIS — C50411 Malignant neoplasm of upper-outer quadrant of right female breast: Secondary | ICD-10-CM

## 2021-07-02 DIAGNOSIS — Z95828 Presence of other vascular implants and grafts: Secondary | ICD-10-CM

## 2021-07-02 DIAGNOSIS — C787 Secondary malignant neoplasm of liver and intrahepatic bile duct: Secondary | ICD-10-CM

## 2021-07-02 DIAGNOSIS — Z170 Estrogen receptor positive status [ER+]: Secondary | ICD-10-CM

## 2021-07-02 LAB — COMPREHENSIVE METABOLIC PANEL
ALT: 144 U/L — ABNORMAL HIGH (ref 0–44)
AST: 156 U/L — ABNORMAL HIGH (ref 15–41)
Albumin: 3.3 g/dL — ABNORMAL LOW (ref 3.5–5.0)
Alkaline Phosphatase: 904 U/L — ABNORMAL HIGH (ref 38–126)
Anion gap: 11 (ref 5–15)
BUN: 13 mg/dL (ref 6–20)
CO2: 23 mmol/L (ref 22–32)
Calcium: 10.1 mg/dL (ref 8.9–10.3)
Chloride: 104 mmol/L (ref 98–111)
Creatinine, Ser: 0.8 mg/dL (ref 0.44–1.00)
GFR, Estimated: >60 mL/min (ref >60)
Glucose, Bld: 153 mg/dL — ABNORMAL HIGH (ref 70–99)
Potassium: 4.1 mmol/L (ref 3.5–5.1)
Sodium: 138 mmol/L (ref 135–145)
Total Bilirubin: 0.8 mg/dL (ref 0.3–1.2)
Total Protein: 7.2 g/dL (ref 6.5–8.1)

## 2021-07-02 LAB — CBC WITH DIFFERENTIAL/PLATELET
Abs Immature Granulocytes: 0.02 10*3/uL (ref 0.00–0.07)
Basophils Absolute: 0 10*3/uL (ref 0.0–0.1)
Basophils Relative: 1 %
Eosinophils Absolute: 0.2 10*3/uL (ref 0.0–0.5)
Eosinophils Relative: 3 %
HCT: 41.4 % (ref 36.0–46.0)
Hemoglobin: 13.7 g/dL (ref 12.0–15.0)
Immature Granulocytes: 0 %
Lymphocytes Relative: 18 %
Lymphs Abs: 1.1 10*3/uL (ref 0.7–4.0)
MCH: 27.6 pg (ref 26.0–34.0)
MCHC: 33.1 g/dL (ref 30.0–36.0)
MCV: 83.3 fL (ref 80.0–100.0)
Monocytes Absolute: 0.6 10*3/uL (ref 0.1–1.0)
Monocytes Relative: 9 %
Neutro Abs: 4.3 10*3/uL (ref 1.7–7.7)
Neutrophils Relative %: 69 %
Platelets: 240 10*3/uL (ref 150–400)
RBC: 4.97 MIL/uL (ref 3.87–5.11)
RDW: 12.6 % (ref 11.5–15.5)
WBC: 6.3 10*3/uL (ref 4.0–10.5)
nRBC: 0 % (ref 0.0–0.2)

## 2021-07-02 MED ORDER — HEPARIN SOD (PORK) LOCK FLUSH 100 UNIT/ML IV SOLN
500.0000 [IU] | Freq: Once | INTRAVENOUS | Status: AC
  Administered 2021-07-02: 500 [IU]

## 2021-07-02 MED ORDER — PROCHLORPERAZINE MALEATE 10 MG PO TABS: 10.0000 mg | ORAL_TABLET | Freq: Four times a day (QID) | ORAL | 1 refills | Status: AC | PRN

## 2021-07-02 MED ORDER — DEXAMETHASONE 4 MG PO TABS: ORAL_TABLET | ORAL | 1 refills | Status: AC

## 2021-07-02 MED ORDER — SODIUM CHLORIDE 0.9% FLUSH
10.0000 mL | Freq: Once | INTRAVENOUS | Status: AC
  Administered 2021-07-02: 10 mL

## 2021-07-02 MED ORDER — LOPERAMIDE HCL 2 MG PO TABS: ORAL_TABLET | ORAL | 1 refills | Status: AC

## 2021-07-02 NOTE — Progress Notes (Signed)
DISCONTINUE ON PATHWAY REGIMEN - Breast     A cycle is every 28 days (3 weeks on and 1 week off):     Paclitaxel   **Always confirm dose/schedule in your pharmacy ordering system**  REASON: Disease Progression PRIOR TREATMENT: [id number]: Paclitaxel 80 mg/m2 D1, 8, 15 q28 Days TREATMENT RESPONSE: Progressive Disease (PD)  START ON PATHWAY REGIMEN - Breast     A cycle is every 21 days:     Sacituzumab govitecan-hziy   **Always confirm dose/schedule in your pharmacy ordering system**  Patient Characteristics: Distant Metastases or Locoregional Recurrent Disease - Unresected or Locally Advanced Unresectable Disease Progressing after Neoadjuvant and Local Therapies, HER2 Low/Negative/Unknown, ER Negative/Unknown, Chemotherapy, HER2 Negative/Unknown, Second Line Therapeutic Status: Distant Metastases HER2 Status: Negative (-) ER Status: Negative (-) PR Status: Negative (-) Therapy Approach Indicated: Standard Chemotherapy/Endocrine Therapy Line of Therapy: Second Line Intent of Therapy: Non-Curative / Palliative Intent, Discussed with Patient

## 2021-07-03 NOTE — Progress Notes (Signed)
..  Patient Assist/Replace for the following has been terminated. Medication: Keytruda (pembrolizumab) Reason for Termination: Provider discontinued 06/14/2021 due to disease progression. Last DOS: 06/03/2021. Marland KitchenJuan Quam, CPhT IV Drug Replacement Specialist [place] Phone: [phone]

## 2021-07-13 ENCOUNTER — Encounter: Payer: Self-pay | Admitting: Oncology

## 2021-07-15 ENCOUNTER — Telehealth: Payer: Self-pay | Admitting: *Deleted

## 2021-07-15 ENCOUNTER — Inpatient Hospital Stay: Payer: 59 | Attending: Oncology

## 2021-07-15 ENCOUNTER — Ambulatory Visit: Payer: 59

## 2021-07-15 ENCOUNTER — Other Ambulatory Visit: Payer: 59

## 2021-07-15 ENCOUNTER — Other Ambulatory Visit: Payer: Self-pay | Admitting: Oncology

## 2021-07-15 ENCOUNTER — Inpatient Hospital Stay: Payer: 59 | Admitting: Pharmacist

## 2021-07-15 ENCOUNTER — Encounter: Payer: Self-pay | Admitting: Oncology

## 2021-07-15 ENCOUNTER — Inpatient Hospital Stay: Payer: 59

## 2021-07-15 ENCOUNTER — Other Ambulatory Visit: Payer: Self-pay

## 2021-07-15 VITALS — BP 130/68 | HR 76 | Temp 97.4°F | Resp 18 | Ht 61.0 in | Wt 225.1 lb

## 2021-07-15 DIAGNOSIS — Z170 Estrogen receptor positive status [ER+]: Secondary | ICD-10-CM

## 2021-07-15 DIAGNOSIS — C787 Secondary malignant neoplasm of liver and intrahepatic bile duct: Secondary | ICD-10-CM | POA: Diagnosis not present

## 2021-07-15 DIAGNOSIS — Z79899 Other long term (current) drug therapy: Secondary | ICD-10-CM | POA: Insufficient documentation

## 2021-07-15 DIAGNOSIS — Z5112 Encounter for antineoplastic immunotherapy: Secondary | ICD-10-CM | POA: Insufficient documentation

## 2021-07-15 DIAGNOSIS — Z5111 Encounter for antineoplastic chemotherapy: Secondary | ICD-10-CM | POA: Diagnosis present

## 2021-07-15 DIAGNOSIS — Z171 Estrogen receptor negative status [ER-]: Secondary | ICD-10-CM | POA: Diagnosis not present

## 2021-07-15 DIAGNOSIS — Z7189 Other specified counseling: Secondary | ICD-10-CM

## 2021-07-15 DIAGNOSIS — C50411 Malignant neoplasm of upper-outer quadrant of right female breast: Secondary | ICD-10-CM

## 2021-07-15 LAB — CBC WITH DIFFERENTIAL/PLATELET
Abs Immature Granulocytes: 0.05 10*3/uL (ref 0.00–0.07)
Basophils Absolute: 0.1 10*3/uL (ref 0.0–0.1)
Basophils Relative: 1 %
Eosinophils Absolute: 0.2 10*3/uL (ref 0.0–0.5)
Eosinophils Relative: 2 %
HCT: 44.7 % (ref 36.0–46.0)
Hemoglobin: 15.1 g/dL — ABNORMAL HIGH (ref 12.0–15.0)
Immature Granulocytes: 1 %
Lymphocytes Relative: 12 %
Lymphs Abs: 1.1 10*3/uL (ref 0.7–4.0)
MCH: 27.9 pg (ref 26.0–34.0)
MCHC: 33.8 g/dL (ref 30.0–36.0)
MCV: 82.5 fL (ref 80.0–100.0)
Monocytes Absolute: 1.1 10*3/uL — ABNORMAL HIGH (ref 0.1–1.0)
Monocytes Relative: 12 %
Neutro Abs: 6.5 10*3/uL (ref 1.7–7.7)
Neutrophils Relative %: 72 %
Platelets: 255 10*3/uL (ref 150–400)
RBC: 5.42 MIL/uL — ABNORMAL HIGH (ref 3.87–5.11)
RDW: 13.2 % (ref 11.5–15.5)
WBC: 9 10*3/uL (ref 4.0–10.5)
nRBC: 0 % (ref 0.0–0.2)

## 2021-07-15 LAB — COMPREHENSIVE METABOLIC PANEL
ALT: 136 U/L — ABNORMAL HIGH (ref 0–44)
AST: 163 U/L — ABNORMAL HIGH (ref 15–41)
Albumin: 3.3 g/dL — ABNORMAL LOW (ref 3.5–5.0)
Alkaline Phosphatase: 998 U/L — ABNORMAL HIGH (ref 38–126)
Anion gap: 13 (ref 5–15)
BUN: 14 mg/dL (ref 6–20)
CO2: 21 mmol/L — ABNORMAL LOW (ref 22–32)
Calcium: 10.2 mg/dL (ref 8.9–10.3)
Chloride: 105 mmol/L (ref 98–111)
Creatinine, Ser: 0.87 mg/dL (ref 0.44–1.00)
GFR, Estimated: >60 mL/min (ref >60)
Glucose, Bld: 137 mg/dL — ABNORMAL HIGH (ref 70–99)
Potassium: 4.2 mmol/L (ref 3.5–5.1)
Sodium: 139 mmol/L (ref 135–145)
Total Bilirubin: 3.3 mg/dL — ABNORMAL HIGH (ref 0.3–1.2)
Total Protein: 7.6 g/dL (ref 6.5–8.1)

## 2021-07-15 LAB — TSH: TSH: 0.906 u[IU]/mL (ref 0.308–3.960)

## 2021-07-15 MED ORDER — SODIUM CHLORIDE 0.9 % IV SOLN
10.0000 mg | Freq: Once | INTRAVENOUS | Status: AC
  Administered 2021-07-15: 10 mg via INTRAVENOUS
  Filled 2021-07-15: qty 10

## 2021-07-15 MED ORDER — HEPARIN SOD (PORK) LOCK FLUSH 100 UNIT/ML IV SOLN
500.0000 [IU] | Freq: Once | INTRAVENOUS | Status: AC | PRN
  Administered 2021-07-15: 500 [IU]

## 2021-07-15 MED ORDER — DIPHENHYDRAMINE HCL 50 MG/ML IJ SOLN
25.0000 mg | Freq: Once | INTRAMUSCULAR | Status: AC
  Administered 2021-07-15: 25 mg via INTRAVENOUS
  Filled 2021-07-15: qty 1

## 2021-07-15 MED ORDER — SODIUM CHLORIDE 0.9% FLUSH
10.0000 mL | INTRAVENOUS | Status: DC | PRN
  Administered 2021-07-15: 10 mL

## 2021-07-15 MED ORDER — SODIUM CHLORIDE 0.9 % IV SOLN: Freq: Once | INTRAVENOUS | Status: AC

## 2021-07-15 MED ORDER — PALONOSETRON HCL INJECTION 0.25 MG/5ML
0.2500 mg | Freq: Once | INTRAVENOUS | Status: AC
  Administered 2021-07-15: 0.25 mg via INTRAVENOUS
  Filled 2021-07-15: qty 5

## 2021-07-15 MED ORDER — ACETAMINOPHEN 325 MG PO TABS
650.0000 mg | ORAL_TABLET | Freq: Once | ORAL | Status: AC
  Administered 2021-07-15: 650 mg via ORAL
  Filled 2021-07-15: qty 2

## 2021-07-15 MED ORDER — SODIUM CHLORIDE 0.9 % IV SOLN
10.0000 mg/kg | Freq: Once | INTRAVENOUS | Status: AC
  Administered 2021-07-15: 1080 mg via INTRAVENOUS
  Filled 2021-07-15: qty 108

## 2021-07-15 MED ORDER — FAMOTIDINE 20 MG IN NS 100 ML IVPB
20.0000 mg | Freq: Once | INTRAVENOUS | Status: AC
  Administered 2021-07-15: 20 mg via INTRAVENOUS
  Filled 2021-07-15: qty 100

## 2021-07-15 MED ORDER — ATROPINE SULFATE 1 MG/ML IJ SOLN
0.5000 mg | Freq: Once | INTRAMUSCULAR | Status: DC | PRN
  Filled 2021-07-15: qty 1

## 2021-07-15 MED ORDER — SODIUM CHLORIDE 0.9 % IV SOLN
150.0000 mg | Freq: Once | INTRAVENOUS | Status: AC
  Administered 2021-07-15: 150 mg via INTRAVENOUS
  Filled 2021-07-15: qty 150

## 2021-07-15 NOTE — Progress Notes (Signed)
[facility]       Telephone: [phone]?Fax: [phone]   Oncology Clinical Pharmacist Practitioner Initial Assessment  Veronica [name] is a 50 y.o. female with a diagnosis of metastatic breast cancer.  Indication/Regimen Sacituzumab Govitecan Veronica [name]) is being used appropriately for treatment of metastatic breast cancer by Veronica [name].    Wt Readings from Last 1 Encounters:  07/15/21 225 lb 1.6 oz (102.1 kg)    Estimated body surface area is 2.1 meters squared as calculated from the following:   Height as of this encounter: 5\' 1"  (1.549 m).   Weight as of this encounter: 225 lb 1.6 oz (102.1 kg).  The dosing regimen is 1,080 mg (10 mg/kg) by IV route on days 1 and 8 of a 21-day cycle. It is planned to continue until disease progression or unacceptable toxicity. Reviewed sacituzumab govitecan with Veronica [name] today. We discussed how administered and possible side effects.  We reviewed her anti-emetics and anti-diarrheal prescriptions and how they should be taken.  Ondansetron will be sent at a later time by Veronica [name] if needed.  Instructed patient to contact the 24/7 triage line for any new symptoms that she may experience.  She will see Veronica Bihari NP next week for Day 8 and then Veronica [name] prior to cycle 2.  She will then see clinical pharmacy again prior to cycle 3. Labs were reviewed with Veronica [name].  AST 163 (3.98 x ULN = G2), ALT 136 (3.09 x ULN = G2), Bilirubin 3.3 (2.75 x ULN = G2). Patient with known liver mets. Veronica [name] okay to treat.  A communication order has been entered into today's treatment plan regarding this correspondence.  Dose Modifications None at this time   Allergies No Known Allergies  Laboratory Data CBC EXTENDED Latest Ref Rng & Units 07/15/2021 07/02/2021 06/22/2021  WBC 4.0 - 10.5 K/uL 9.0 6.3 7.2  RBC 3.87 - 5.11 MIL/uL 5.42(H) 4.97 4.87  HGB 12.0 - 15.0 g/dL 15.1(H) 13.7 13.8  HCT 36.0 - 46.0 % 44.7 41.4 41.0   PLT 150 - 400 K/uL 255 240 218  NEUTROABS 1.7 - 7.7 K/uL 6.5 4.3 5.4  LYMPHSABS 0.7 - 4.0 K/uL 1.1 1.1 0.9    CMP Latest Ref Rng & Units 07/15/2021 07/02/2021 06/22/2021  Glucose 70 - 99 mg/dL 137(H) 153(H) 160(H)  BUN 6 - 20 mg/dL 14 13 11   Creatinine 0.44 - 1.00 mg/dL 0.87 0.80 0.77  Sodium 135 - 145 mmol/L 139 138 137  Potassium 3.5 - 5.1 mmol/L 4.2 4.1 4.2  Chloride 98 - 111 mmol/L 105 104 103  CO2 22 - 32 mmol/L 21(L) 23 23  Calcium 8.9 - 10.3 mg/dL 10.2 10.1 9.8  Total Protein 6.5 - 8.1 g/dL 7.6 7.2 7.2  Total Bilirubin 0.3 - 1.2 mg/dL 3.3(H) 0.8 1.1  Alkaline Phos 38 - 126 U/L 998(H) 904(H) 585(H)  AST 15 - 41 U/L 163(H) 156(H) 89(H)  ALT 0 - 44 U/L 136(H) 144(H) 108(H)    Contraindications Contraindications were reviewed? Yes Contraindications to therapy were identified? No   Safety Precautions The following safety precautions for the use of sacituzumab govitecan were reviewed:  Neutropenia: instructed patient to contact clinic if fever which CDC considers 100.4 F or higher Diarrhea: has loperamide at home and will take PRN. Will contact clinic if refractory Nausea / Vomiting: has dexamethasone and prochlorperazine at home. Dexamethasone for 3 days starting day after chemotherapy. Prochlorperazine PRN. Veronica [name] may send ondansetron if continued nausea  despite current anti-nausea regimen Fertility: patient has had hysterectomy Hypersensitivity reaction: reviewed symptoms and instructed to call 24/7 triage line if needed Proper fluid intake, monitoring urine output Alopecia Fatigue Abdominal Pain Rash / itchy skin Anemia  Medication Reconciliation Current Outpatient Medications  Medication Sig Dispense Refill   Cholecalciferol (VITAMIN D3) 25 MCG (1000 UT) CHEW Chew 1 tablet by mouth daily.      dexamethasone (DECADRON) 4 MG tablet Take 2 tablets (8 mg) daily for 3 days after chemotherapy. Take with food. 30 tablet 1   levothyroxine (SYNTHROID) 100 MCG tablet Take  1 tablet (100 mcg total) by mouth daily before breakfast. 30 tablet 6   loperamide (IMODIUM A-D) 2 MG tablet Take 2 at diarrhea onset, then 1 every 2hr until 12hrs with no BM. May take 2 every 4hrs at night. If diarrhea recurs repeat. 100 tablet 1   prochlorperazine (COMPAZINE) 10 MG tablet Take 1 tablet (10 mg total) by mouth every 6 (six) hours as needed (Nausea or vomiting). 30 tablet 1   rivaroxaban (XARELTO) 20 MG TABS tablet Take 1 tablet (20 mg total) by mouth daily with supper. 90 tablet 3   ketoconazole (NIZORAL) 2 % cream Apply 1 application topically daily. (Patient taking differently: Apply 1 application topically daily as needed.) 15 g 4   metoprolol tartrate (LOPRESSOR) 25 MG tablet Take 1 tablet (25 mg total) by mouth 2 (two) times daily. 60 tablet 0   No current facility-administered medications for this visit.   Facility-Administered Medications Ordered in Other Visits  Medication Dose Route Frequency Provider Last Rate Last Admin   sodium chloride flush (NS) 0.9 % injection 10 mL  10 mL Intravenous PRN Magrinat, Virgie Dad, MD   10 mL at 03/04/21 0935    Medication reconciliation is based on the patient's most recent medication list in the electronic medical record (EMR) including herbal products and OTC medications.   The patient's medication list was reviewed today with the patient? Yes   Drug-drug interactions (DDIs) DDIs were evaluated? Yes DDIs identified? No   Drug-Food Interactions Drug-food interactions were evaluated? Yes Drug-food interactions identified? No   Follow-up Plan  Lab through port, visit with Veronica Bihari NP, cycle 1, day 8 infusion of sacituzumab govitecan on 07/22/21 Lab through port, visit with Veronica [name], cycle 2, day 1 of sacituzumab govitecan on 08/05/21 Lab through port, cycle 2, day 8 of sacituzumab govitecan on 08/12/21 Lab through port, visit with [facility], cycle 3, day 1 of sacituzumab govitecan on 08/26/21  Veronica [name]  participated in the discussion, expressed understanding, and voiced agreement with the above plan. All questions were answered to her satisfaction. The patient was advised to contact the clinic at [phone] with any questions or concerns prior to her return visit.   I spent 45 minutes assessing the patient.  Raina Mina, RPH-CPP, 07/15/2021 10:00 AM

## 2021-07-15 NOTE — Patient Instructions (Signed)
Veronica [name] ONCOLOGY   Discharge Instructions: Thank you for choosing [facility] to provide your oncology and hematology care.   If you have a lab appointment with the [facility], please go directly to the [facility] and check in at the registration area.   Wear comfortable clothing and clothing appropriate for easy access to any Portacath or PICC line.   We strive to give you quality time with your provider. You may need to reschedule your appointment if you arrive late (15 or more minutes).  Arriving late affects you and other patients whose appointments are after yours.  Also, if you miss three or more appointments without notifying the office, you may be dismissed from the clinic at the provider's discretion.      For prescription refill requests, have your pharmacy contact our office and allow 72 hours for refills to be completed.    Today you received the following chemotherapy and/or immunotherapy agents: sacituzumab govitecan [name]).      To help prevent nausea and vomiting after your treatment, we encourage you to take your nausea medication as directed.  BELOW ARE SYMPTOMS THAT SHOULD BE REPORTED IMMEDIATELY: *FEVER GREATER THAN 100.4 F (38 C) OR HIGHER *CHILLS OR SWEATING *NAUSEA AND VOMITING THAT IS NOT CONTROLLED WITH YOUR NAUSEA MEDICATION *UNUSUAL SHORTNESS OF BREATH *UNUSUAL BRUISING OR BLEEDING *URINARY PROBLEMS (pain or burning when urinating, or frequent urination) *BOWEL PROBLEMS (unusual diarrhea, constipation, pain near the anus) TENDERNESS IN MOUTH AND THROAT WITH OR WITHOUT PRESENCE OF ULCERS (sore throat, sores in mouth, or a toothache) UNUSUAL RASH, SWELLING OR PAIN  UNUSUAL VAGINAL DISCHARGE OR ITCHING   Items with * indicate a potential emergency and should be followed up as soon as possible or go to the Emergency Department if any problems should occur.  Please show the CHEMOTHERAPY ALERT CARD or IMMUNOTHERAPY  ALERT CARD at check-in to the Emergency Department and triage nurse.  Should you have questions after your visit or need to cancel or reschedule your appointment, please contact [facility]  Dept: [phone]  and follow the prompts.  Office hours are 8:00 a.m. to 4:30 p.m. Monday - Friday. Please note that voicemails left after 4:00 p.m. may not be returned until the following business day.  We are closed weekends and major holidays. You have access to a nurse at all times for urgent questions. Please call the main number to the clinic Dept: [phone] and follow the prompts.   For any non-urgent questions, you may also contact your provider using MyChart. We now offer e-Visits for anyone 14 and older to request care online for non-urgent symptoms. For details visit [email or web address].   Also download the MyChart app! Go to the app store, search "MyChart", open the app, select [facility], and log in with your MyChart username and password.  Due to Covid, a mask is required upon entering the hospital/clinic. If you do not have a mask, one will be given to you upon arrival. For doctor visits, patients may have 1 support person aged 62 or older with them. For treatment visits, patients cannot have anyone with them due to current Covid guidelines and our immunocompromised population.   Sacituzumab Govitecan Injection What is this medication? SACITUZUMAB GOVITECAN (SAK i TOOZ ue mab GOE vi TEE kan) is a monoclonal antibody combined with chemotherapy. It is used to treat breast cancer and urothelial cancer. This medicine may be used for other purposes; ask your health  care provider or pharmacist if you have questions. COMMON BRAND NAME(S): TRODELVY What should I tell my care team before I take this medication? They need to know if you have any of these conditions: diarrhea infection (especially a virus infection such as chickenpox, cold sores, or herpes) liver  disease low blood counts, like low white cell, platelet, or red cell counts an unusual or allergic reaction to sacituzumab govitecan, other medicines, foods, dyes, or preservatives pregnant or trying to get pregnant breast-feeding How should I use this medication? This medicine is for infusion into a vein. It is given by a healthcare professional in a hospital or clinic setting. Talk to your pediatrician about the use of this medicine in children. Special care may be needed. Overdosage: If you think you have taken too much of this medicine contact a poison control center or emergency room at once. NOTE: This medicine is only for you. Do not share this medicine with others. What if I miss a dose? Keep appointments for follow-up doses. It is important not to miss your dose. Call your doctor or health care professional if you are unable to keep an appointment. What may interact with this medication? certain antivirals for HIV or hepatitis gemfibrozil This list may not describe all possible interactions. Give your health care provider a list of all the medicines, herbs, non-prescription drugs, or dietary supplements you use. Also tell them if you smoke, drink alcohol, or use illegal drugs. Some items may interact with your medicine. What should I watch for while using this medication? Your condition will be monitored carefully while you are receiving this medicine. You may need blood work done while you are taking this medicine. This medicine can cause serious allergic reactions. To reduce your risk, you may need to take medicine before treatment with this medicine. Take your medicine as directed. Do not become pregnant while taking this medicine or for 6 months after stopping it. Women should inform their health care professional if they wish to become pregnant or think they might be pregnant. Men should not father a child while taking this medicine and for 3 months after stopping it. There is  potential for serious side effects to an unborn child. Talk to your health care professional for more information. Do not breast-feed a child while taking this medicine or for 1 month after stopping it. This medicine may increase your risk of getting an infection. Call your health care professional for advice if you get a fever, chills, or sore throat, or other symptoms of a cold or flu. Do not treat yourself. Try to avoid being around people who are sick. Avoid taking medicines that contain aspirin, acetaminophen, ibuprofen, naproxen, or ketoprofen unless instructed by your health care professional. These medicines may hide a fever. This medicine has caused ovarian failure in some women. This medicine may make it more difficult to get pregnant. Talk to your health care professional if you are concerned about your fertility. What side effects may I notice from receiving this medication? Side effects that you should report to your doctor or health care professional as soon as possible: allergic reactions like skin rash, itching or hives; swelling of the face, lips, or tongue diarrhea nausea/vomiting signs and symptoms of infection like fever; chills; cough; sore throat; pain or trouble passing urine signs and symptoms of low red blood cells or anemia such as unusually weak or tired; feeling faint or lightheaded; falls; breathing problems Side effects that usually do not require medical attention (  report these to your doctor or health care professional if they continue or are bothersome): constipation hair loss headache loss of appetite signs and symptoms of high blood sugar such as being more thirsty or hungry or having to urinate more than normal. You may also feel very tired or have blurry vision. trouble sleeping This list may not describe all possible side effects. Call your doctor for medical advice about side effects. You may report side effects to FDA at 1-800-FDA-1088. Where should I keep  my medication? This medicine is given in a hospital or clinic and will not be stored at home. NOTE: This sheet is a summary. It may not cover all possible information. If you have questions about this medicine, talk to your doctor, pharmacist, or health care provider.  2022 Elsevier/Gold Standard (2020-01-25 11:59:29)

## 2021-07-15 NOTE — Telephone Encounter (Signed)
Per MD review with noted elevated LFTs - order given to proceed with treatment.  Treatment room nurse notified

## 2021-07-16 LAB — CANCER ANTIGEN 27.29: CA 27.29: 304.2 U/mL — ABNORMAL HIGH (ref 0.0–38.6)

## 2021-07-16 LAB — T4: T4, Total: 15.1 ug/dL — ABNORMAL HIGH (ref 4.5–12.0)

## 2021-07-17 ENCOUNTER — Encounter (HOSPITAL_COMMUNITY): Payer: Self-pay

## 2021-07-17 ENCOUNTER — Emergency Department (HOSPITAL_COMMUNITY)
Admission: EM | Admit: 2021-07-17 | Discharge: 2021-07-17 | Disposition: A | Discharge status: Home / Self Care | Payer: 59 | Attending: Emergency Medicine | Admitting: Emergency Medicine

## 2021-07-17 ENCOUNTER — Emergency Department (HOSPITAL_COMMUNITY): Payer: 59

## 2021-07-17 DIAGNOSIS — Z8616 Personal history of COVID-19: Secondary | ICD-10-CM | POA: Diagnosis not present

## 2021-07-17 DIAGNOSIS — R1011 Right upper quadrant pain: Secondary | ICD-10-CM | POA: Insufficient documentation

## 2021-07-17 DIAGNOSIS — Z8505 Personal history of malignant neoplasm of liver: Secondary | ICD-10-CM | POA: Insufficient documentation

## 2021-07-17 DIAGNOSIS — E039 Hypothyroidism, unspecified: Secondary | ICD-10-CM | POA: Diagnosis not present

## 2021-07-17 DIAGNOSIS — C50919 Malignant neoplasm of unspecified site of unspecified female breast: Secondary | ICD-10-CM

## 2021-07-17 DIAGNOSIS — Z79899 Other long term (current) drug therapy: Secondary | ICD-10-CM | POA: Diagnosis not present

## 2021-07-17 DIAGNOSIS — R1013 Epigastric pain: Secondary | ICD-10-CM | POA: Insufficient documentation

## 2021-07-17 DIAGNOSIS — I10 Essential (primary) hypertension: Secondary | ICD-10-CM | POA: Diagnosis not present

## 2021-07-17 DIAGNOSIS — Z853 Personal history of malignant neoplasm of breast: Secondary | ICD-10-CM | POA: Insufficient documentation

## 2021-07-17 DIAGNOSIS — Z85830 Personal history of malignant neoplasm of bone: Secondary | ICD-10-CM | POA: Diagnosis not present

## 2021-07-17 DIAGNOSIS — Z7901 Long term (current) use of anticoagulants: Secondary | ICD-10-CM | POA: Insufficient documentation

## 2021-07-17 LAB — COMPREHENSIVE METABOLIC PANEL
ALT: 106 U/L — ABNORMAL HIGH (ref 0–44)
AST: 122 U/L — ABNORMAL HIGH (ref 15–41)
Albumin: 3.3 g/dL — ABNORMAL LOW (ref 3.5–5.0)
Alkaline Phosphatase: 854 U/L — ABNORMAL HIGH (ref 38–126)
Anion gap: 10 (ref 5–15)
BUN: 24 mg/dL — ABNORMAL HIGH (ref 6–20)
CO2: 20 mmol/L — ABNORMAL LOW (ref 22–32)
Calcium: 9.5 mg/dL (ref 8.9–10.3)
Chloride: 103 mmol/L (ref 98–111)
Creatinine, Ser: 0.69 mg/dL (ref 0.44–1.00)
GFR, Estimated: >60 mL/min (ref >60)
Glucose, Bld: 333 mg/dL — ABNORMAL HIGH (ref 70–99)
Potassium: 4.7 mmol/L (ref 3.5–5.1)
Sodium: 133 mmol/L — ABNORMAL LOW (ref 135–145)
Total Bilirubin: 1.5 mg/dL — ABNORMAL HIGH (ref 0.3–1.2)
Total Protein: 7.2 g/dL (ref 6.5–8.1)

## 2021-07-17 LAB — CBC
HCT: 44.2 % (ref 36.0–46.0)
Hemoglobin: 15 g/dL (ref 12.0–15.0)
MCH: 28.1 pg (ref 26.0–34.0)
MCHC: 33.9 g/dL (ref 30.0–36.0)
MCV: 82.9 fL (ref 80.0–100.0)
Platelets: 308 10*3/uL (ref 150–400)
RBC: 5.33 MIL/uL — ABNORMAL HIGH (ref 3.87–5.11)
RDW: 13 % (ref 11.5–15.5)
WBC: 13.5 10*3/uL — ABNORMAL HIGH (ref 4.0–10.5)
nRBC: 0 % (ref 0.0–0.2)

## 2021-07-17 LAB — URINALYSIS, ROUTINE W REFLEX MICROSCOPIC
Bilirubin Urine: NEGATIVE
Glucose, UA: 150 mg/dL — AB
Hgb urine dipstick: NEGATIVE
Ketones, ur: NEGATIVE mg/dL
Leukocytes,Ua: NEGATIVE
Nitrite: NEGATIVE
Protein, ur: NEGATIVE mg/dL
Specific Gravity, Urine: 1.014 (ref 1.005–1.030)
pH: 6 (ref 5.0–8.0)

## 2021-07-17 LAB — LIPASE, BLOOD: Lipase: 24 U/L (ref 11–51)

## 2021-07-17 IMAGING — CT CT ABD-PELV W/ CM
2 of 5 series · 15 of 46 positions shown, 17 images · IV contrast (OMNIPAQUE 350)
Comparison: No prior CT the abdomen and pelvis. Abdominal MRI
[DATE]. Chest CTA [DATE].

CLINICAL DATA: 50-year-old female with history of abdominal pain.
Suspected abdominal infection or abscess. History of breast cancer
with liver metastasis. Ongoing chemotherapy.

EXAM:
CT ABDOMEN AND PELVIS WITH CONTRAST
TECHNIQUE: Multidetector CT imaging of the abdomen and pelvis was performed
using the standard protocol following bolus administration of
intravenous contrast.
CONTRAST:  80mL OMNIPAQUE IOHEXOL 350 MG/ML SOLN

[Series 2: axial st · axial · 0.70mm/px · z∈[+1173,+1578]mm · 12 of 93 slices shown, 14 images]
[im 6/93  soft-tissue]
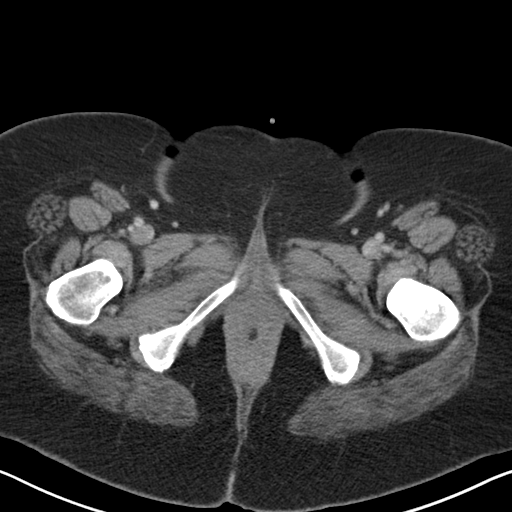
[im 6/93  bone]
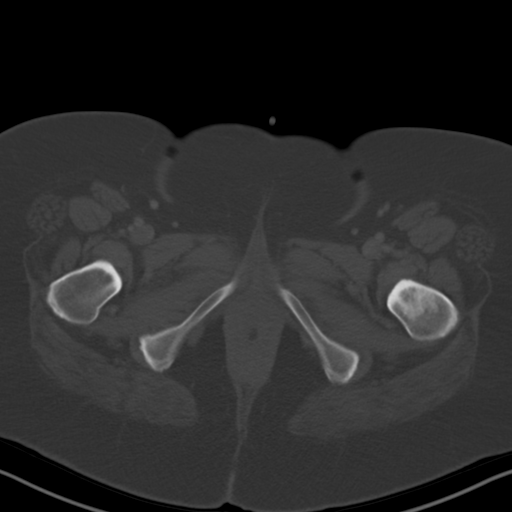
[im 17/93  soft-tissue]
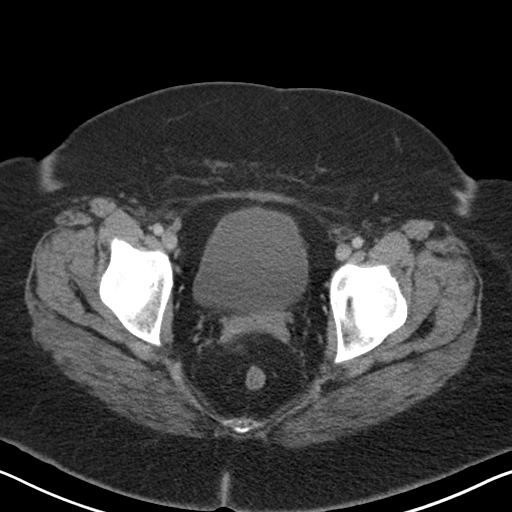
[im 22/93  soft-tissue]
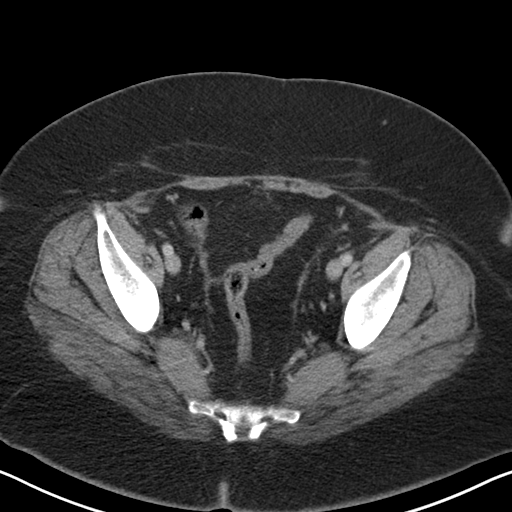
[im 28/93  soft-tissue]
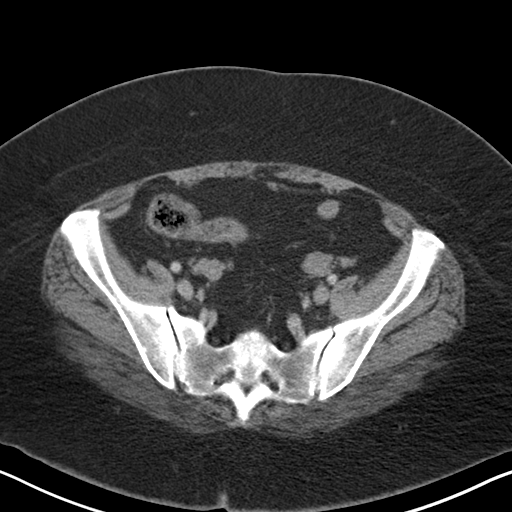
[im 38/93  soft-tissue]
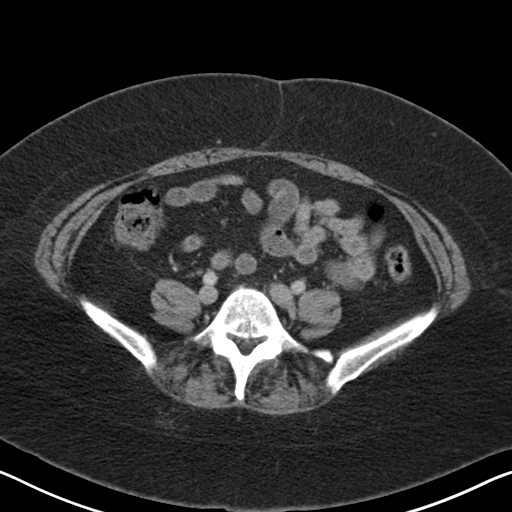
[im 44/93  soft-tissue]
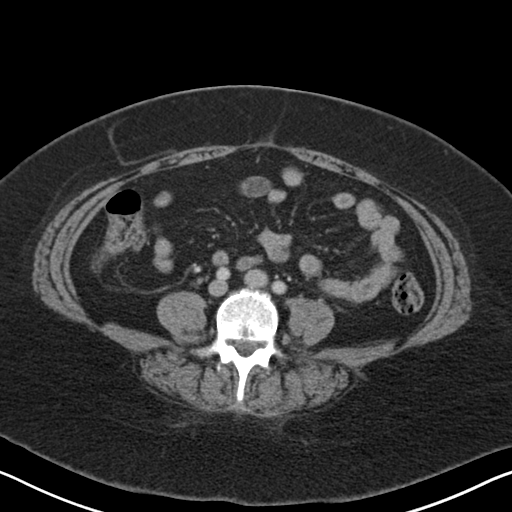
[im 49/93  soft-tissue]
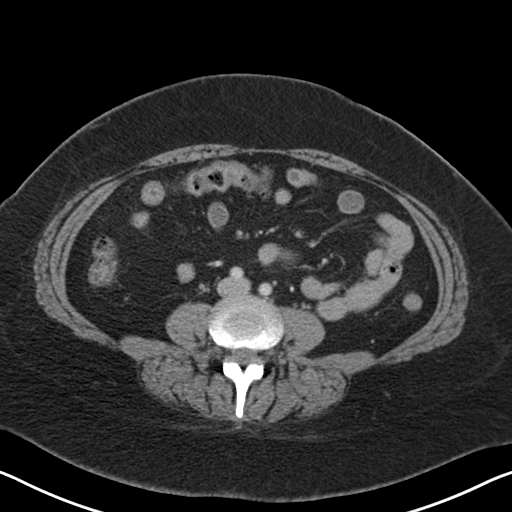
[im 60/93  soft-tissue]
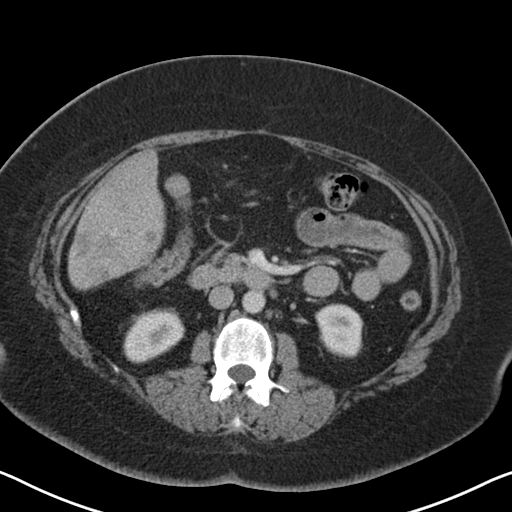
[im 65/93  soft-tissue]
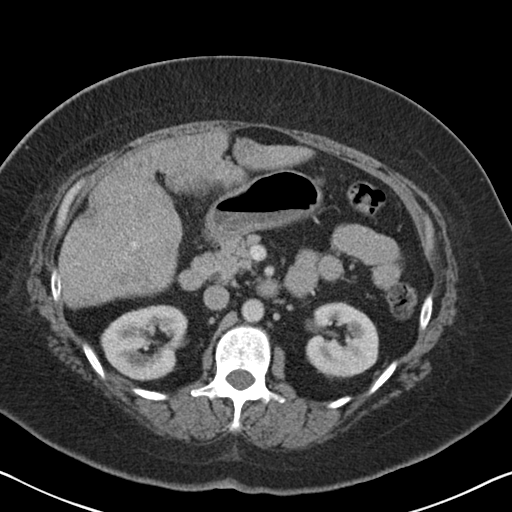
[im 65/93  bone]
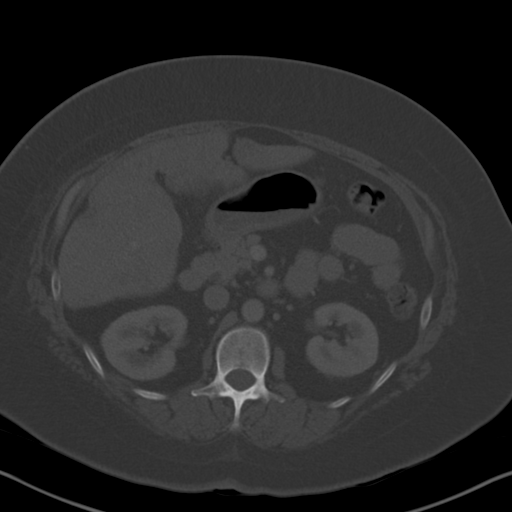
[im 71/93  soft-tissue]
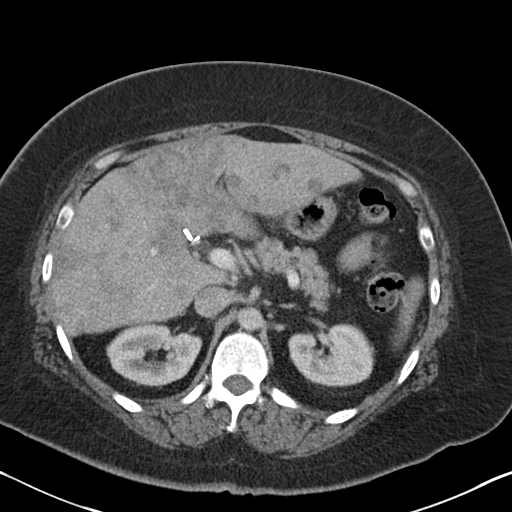
[im 82/93  soft-tissue]
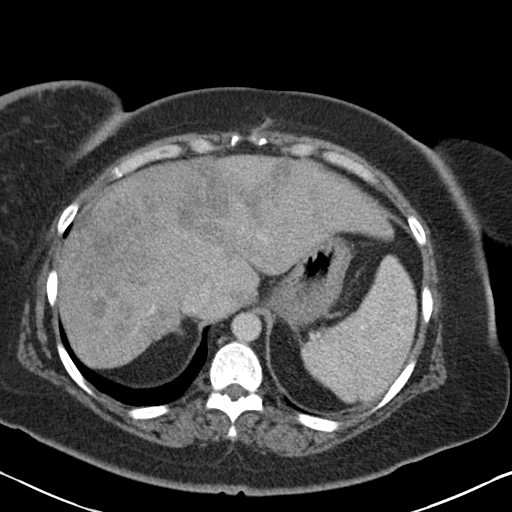
[im 87/93  soft-tissue]
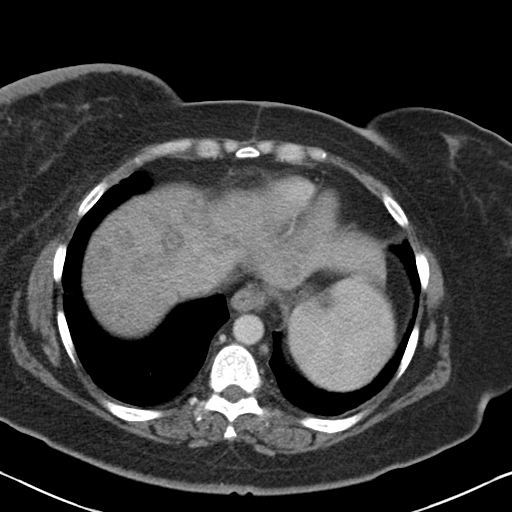

[Series 4: coronal st · coronal · 0.74mm/px · 3 of 151 slices shown]
[im 51/151  soft-tissue]
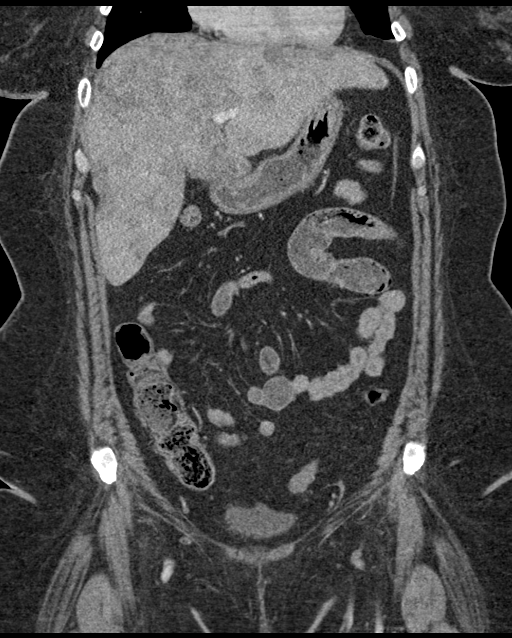
[im 67/151  soft-tissue]
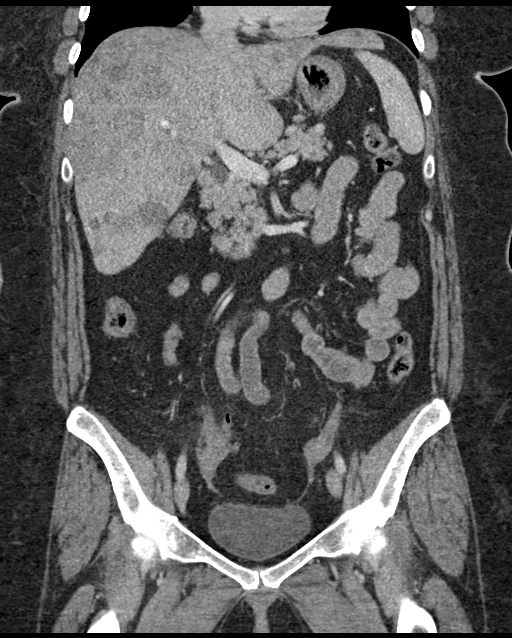
[im 84/151  soft-tissue]
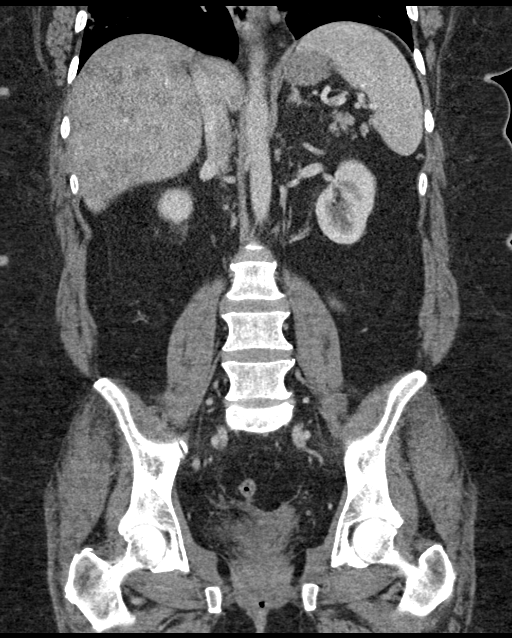

[15 of 46 positions shown; findings below may reference images not displayed]

FINDINGS: Lower chest: Scarring in the periphery of the right lower lobe,
similar to prior chest CTA [DATE]. Diffuse skin thickening in
the right breast.

Hepatobiliary: Although difficult to compare with prior abdominal
MRI [DATE] given the differences in modalities, there does
appear to be an increase in number and size of numerous hypovascular
hepatic lesions on today's examination, concerning for progression
of metastatic disease to the liver. Amongst the discrete lesions,
specific examples of enlarging lesions include a 2.8 x 1.9 cm lesion
in segment 2 (axial image 8 of series 2) which previously measured
2.2 x 1.3 cm on axial image 8 of series 2 of the prior MRI
examination [DATE]. Another lesion which has clearly progressed
is in the right lobe of the liver in segment 6 (axial image 32 of
series 2) which currently measures 2.9 x 2.1 cm (previously
approximately 1.5 x 1.1 cm on axial image 23 of series 2). No intra
or extrahepatic biliary ductal dilatation. Status post
cholecystectomy.

Pancreas: No pancreatic mass. No pancreatic ductal dilatation. No
pancreatic or peripancreatic fluid collections or inflammatory
changes.

Spleen: Unremarkable.

Adrenals/Urinary Tract: Bilateral kidneys and bilateral adrenal
glands are normal in appearance. No hydroureteronephrosis. Urinary
bladder is normal in appearance.

Stomach/Bowel: The appearance of the stomach is normal. There is no
pathologic dilatation of small bowel or colon. Normal appendix.

Vascular/Lymphatic: No significant atherosclerotic disease, aneurysm
or dissection noted in the abdominal or pelvic vasculature. No
lymphadenopathy noted in the abdomen or pelvis.

Reproductive: Status post hysterectomy. Ovaries are unremarkable in
appearance.

Other: Trace volume of ascites in the low anatomic pelvis. No
pneumoperitoneum.

Musculoskeletal: Sclerotic lesions are noted throughout the
visualized axial and appendicular skeleton, concerning for
metastatic disease to the bones, largest of which is in the left
femoral neck measuring approximately 3.2 x 2.7 x 5.5 cm (axial image
87 of series 2 and coronal image 74 of series 4).
IMPRESSION: 1. Today's study demonstrates probable progression of metastatic
disease throughout the liver, as discussed above.
2. No other acute findings are noted in the abdomen or pelvis to
account for the patient's symptoms.
3. Trace volume of ascites.
4. Sclerotic lesions in the bones concerning for metastatic disease,
largest of which is in the left femoral neck.

## 2021-07-17 MED ORDER — SODIUM CHLORIDE 0.9 % IV BOLUS
1000.0000 mL | Freq: Once | INTRAVENOUS | Status: AC
  Administered 2021-07-17: 1000 mL via INTRAVENOUS

## 2021-07-17 MED ORDER — HYDROMORPHONE HCL 1 MG/ML IJ SOLN
1.0000 mg | Freq: Once | INTRAMUSCULAR | Status: AC
Start: 2021-07-17 — End: 2021-07-17
  Administered 2021-07-17: 1 mg via INTRAVENOUS
  Filled 2021-07-17: qty 1

## 2021-07-17 MED ORDER — IOHEXOL 350 MG/ML SOLN
80.0000 mL | Freq: Once | INTRAVENOUS | Status: AC | PRN
  Administered 2021-07-17: 80 mL via INTRAVENOUS

## 2021-07-17 MED ORDER — OXYCODONE-ACETAMINOPHEN 5-325 MG PO TABS: 1.0000 | ORAL_TABLET | Freq: Four times a day (QID) | ORAL | 0 refills | Status: AC | PRN

## 2021-07-17 MED ORDER — ONDANSETRON HCL 4 MG/2ML IJ SOLN
4.0000 mg | Freq: Once | INTRAMUSCULAR | Status: AC
  Administered 2021-07-17: 4 mg via INTRAVENOUS
  Filled 2021-07-17: qty 2

## 2021-07-17 NOTE — ED Provider Notes (Signed)
[facility] DEPT Provider Note   CSN: [id number] Arrival date & time: 07/17/21  0417     History Chief Complaint  Patient presents with   Abdominal Pain    Veronica [name] is a 50 y.o. female.  Patient is a 50 year old female with a past medical history of metastatic breast cancer with multiple lesions involving the liver.  She presents today with complaints of epigastric and right upper quadrant pain.  This started at approximately 2 AM in the absence of any injury or trauma.  She describes a constant pain with no fever, nausea, or vomiting.  She denies any bowel or bladder complaints.  She is status postcholecystectomy in the past.  The history is provided by the patient.  Abdominal Pain Pain location:  Epigastric and RUQ Pain quality: cramping   Pain radiates to:  Does not radiate Pain severity:  Moderate Onset quality:  Sudden Duration:  3 hours Timing:  Constant Progression:  Worsening Chronicity:  New Relieved by:  Nothing Worsened by:  Movement and palpation Ineffective treatments:  None tried     Past Medical History:  Diagnosis Date   Cancer ([place]) 2020   breast Cancer, 2022 liver mets   Chronic low back pain with left-sided sciatica    Family history of leukemia    Family history of stomach cancer    Hypertension    Personal history of chemotherapy    Personal history of radiation therapy    finished june '21    Patient Active Problem List   Diagnosis Date Noted   Hypothyroidism (acquired) 03/25/2021   Bone metastases ([place]) 11/26/2020   Liver metastases ([place]) 11/12/2020   Wound dehiscence 12/03/2019   Pleural effusion    Sinus tachycardia    Essential hypertension    Gastroesophageal reflux disease    SVC syndrome 09/29/2019   COVID-19 virus infection    Angioedema 09/28/2019   Multiple subsegmental pulmonary emboli without acute cor pulmonale ([place]) 09/28/2019   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   SVCO  (superior vena cava obstruction) 09/28/2019   Cough    Sepsis ([place]) 08/29/2019   Neutropenia with fever ([place]) 08/29/2019   Hematochezia 08/29/2019   Anemia 08/29/2019   Thrombocytopenia ([place]) 08/29/2019   Port-A-Cath in place 07/24/2019   Goals of care, counseling/discussion 05/17/2019   Family history of leukemia    Family history of stomach cancer    Malignant neoplasm of upper-outer quadrant of right breast in female, estrogen receptor positive ([place]) 05/07/2019   S/P laparoscopic assisted vaginal hysterectomy (LAVH) 05/17/2011    Class: Chronic   FOOT PAIN, RIGHT 07/11/2010    Past Surgical History:  Procedure Laterality Date   APPLICATION OF WOUND VAC N/A 10/16/2019   Procedure: APPLICATION OF WOUND VAC;  Surgeon: Gaye Pollack, MD;  Location: [facility];  Service: Vascular;  Laterality: N/A;   BREAST BIOPSY Right 05/02/2019   right clips X2   BREAST LUMPECTOMY Right    06/2019   BREAST LUMPECTOMY WITH RADIOACTIVE SEED AND SENTINEL LYMPH NODE BIOPSY Right 06/12/2019   Procedure: RIGHT BREAST RADIOACTIVE SEED LUMPECTOMY  AND RIGHT AXILLARY SEED TARGETED LYMPH NODE AND [place];  Surgeon: Erroll Luna, MD;  Location: [facility];  Service: General;  Laterality: Right;  PEC BLOCK   C/S x2  '98 '[place]  10/02/2019   Procedure: Insertion Central Line Adult;  Surgeon: Serafina Mitchell, MD;  Location: [facility];  Service: Vascular;;   CESAREAN SECTION     CHOLECYSTECTOMY     I & D EXTREMITY N/A 10/16/2019   Procedure: DEBRIDEMENT of DEHISCED MIDLINE SURGICAL INCISION;  Surgeon: Gaye Pollack, MD;  Location: [facility];  Service: Vascular;  Laterality: N/A;   IR IMAGING GUIDED PORT INSERTION  11/24/2020   LAPAROSCOPIC ASSISTED VAGINAL HYSTERECTOMY  05/17/2011   Procedure: LAPAROSCOPIC ASSISTED VAGINAL HYSTERECTOMY;  Surgeon: Sharene Butters;  Location: [facility];  Service: Gynecology;  Laterality: N/A;  Laparoscopic Assisted  Vaginal Hysterectomy With Lysis Of Adhesions   PERICARDIAL WINDOW N/A 10/02/2019   Procedure: Pericardial Window;  Surgeon: Serafina Mitchell, MD;  Location: [facility];  Service: Vascular;  Laterality: N/A;   PORT-A-CATH REMOVAL  10/02/2019   Procedure: Removal Port-A-Cath;  Surgeon: Serafina Mitchell, MD;  Location: [facility];  Service: Vascular;;   PORTACATH PLACEMENT Right 06/12/2019   Procedure: INSERTION PORT-A-CATH WITH ULTRASOUND;  Surgeon: Erroll Luna, MD;  Location: [facility];  Service: General;  Laterality: Right;   RE-EXCISION OF BREAST LUMPECTOMY Right 07/17/2019   Procedure: RE-EXCISION OF RIGHT BREAST LUMPECTOMY;  Surgeon: Erroll Luna, MD;  Location: [facility];  Service: General;  Laterality: Right;   TUBAL LIGATION     ULTRASOUND GUIDANCE FOR VASCULAR ACCESS  10/02/2019   Procedure: Ultrasound Guidance For Vascular Access;  Surgeon: Serafina Mitchell, MD;  Location: [facility];  Service: Vascular;;   VENOGRAM N/A 10/02/2019   Procedure: Central VENOGRAM;  Surgeon: Serafina Mitchell, MD;  Location: [facility];  Service: Vascular;  Laterality: N/A;     OB History   No obstetric history on file.     Family History  Problem Relation Age of Onset   Arthritis Mother    COPD Mother    Hypertension Mother    Hyperlipidemia Mother    Leukemia Brother 33   Stomach cancer Maternal Grandmother        late 76s    Social History   Tobacco Use   Smoking status: Never   Smokeless tobacco: Never  Vaping Use   Vaping Use: Never used  Substance Use Topics   Alcohol use: Not Currently    Alcohol/week: 2.0 standard drinks    Types: 2 Standard drinks or equivalent per week    Comment: "2-3 a week"   Drug use: No    Home Medications Prior to Admission medications   Medication Sig Start Date End Date Taking? Authorizing Provider  Cholecalciferol (VITAMIN D3) 25 MCG (1000 UT) CHEW Chew 1 tablet by mouth daily.     [provider]  dexamethasone  (DECADRON) 4 MG tablet Take 2 tablets (8 mg) daily for 3 days after chemotherapy. Take with food. 07/02/21   Magrinat, Virgie Dad, MD  ketoconazole (NIZORAL) 2 % cream Apply 1 application topically daily. Patient taking differently: Apply 1 application topically daily as needed. 04/15/21   Magrinat, Virgie Dad, MD  levothyroxine (SYNTHROID) 100 MCG tablet Take 1 tablet (100 mcg total) by mouth daily before breakfast. 06/09/21   Magrinat, Virgie Dad, MD  loperamide (IMODIUM A-D) 2 MG tablet Take 2 at diarrhea onset, then 1 every 2hr until 12hrs with no BM. May take 2 every 4hrs at night. If diarrhea recurs repeat. 07/02/21   Magrinat, Virgie Dad, MD  metoprolol tartrate (LOPRESSOR) 25 MG tablet Take 1 tablet (25 mg total) by mouth 2 (two) times daily. 07/23/20 08/22/20  Magrinat, Virgie Dad, MD  prochlorperazine (COMPAZINE) 10 MG tablet Take 1 tablet (  10 mg total) by mouth every 6 (six) hours as needed (Nausea or vomiting). 07/02/21   Magrinat, Virgie Dad, MD  rivaroxaban (XARELTO) 20 MG TABS tablet Take 1 tablet (20 mg total) by mouth daily with supper. 11/26/20   Magrinat, Virgie Dad, MD    Allergies    Patient has no known allergies.  Review of Systems   Review of Systems  Gastrointestinal:  Positive for abdominal pain.  All other systems reviewed and are negative.  Physical Exam Updated Vital Signs BP (!) 151/89 (BP Location: Left Arm)   Pulse 60   Temp 97.9 F (36.6 C) (Oral)   Resp 18   SpO2 99%   Physical Exam Vitals and nursing note reviewed.  Constitutional:      General: She is not in acute distress.    Appearance: She is well-developed. She is not diaphoretic.  HENT:     Head: Normocephalic and atraumatic.  Cardiovascular:     Rate and Rhythm: Normal rate and regular rhythm.     Heart sounds: No murmur heard.   No friction rub. No gallop.  Pulmonary:     Effort: Pulmonary effort is normal. No respiratory distress.     Breath sounds: Normal breath sounds. No wheezing.  Abdominal:      General: Bowel sounds are normal. There is no distension.     Palpations: Abdomen is soft.     Tenderness: There is abdominal tenderness in the right upper quadrant and epigastric area. There is no right CVA tenderness, left CVA tenderness, guarding or rebound.  Musculoskeletal:        General: Normal range of motion.     Cervical back: Normal range of motion and neck supple.  Skin:    General: Skin is warm and dry.  Neurological:     General: No focal deficit present.     Mental Status: She is alert and oriented to person, place, and time.    ED Results / Procedures / Treatments   Labs (all labs ordered are listed, but only abnormal results are displayed) Labs Reviewed  LIPASE, BLOOD  COMPREHENSIVE METABOLIC PANEL  CBC  URINALYSIS, ROUTINE W REFLEX MICROSCOPIC    EKG None  Radiology No results found.  Procedures Procedures   Medications Ordered in ED Medications  sodium chloride 0.9 % bolus 1,000 mL (has no administration in time range)  HYDROmorphone (DILAUDID) injection 1 mg (has no administration in time range)  ondansetron (ZOFRAN) injection 4 mg (has no administration in time range)    ED Course  I have reviewed the triage vital signs and the nursing notes.  Pertinent labs & imaging results that were available during my care of the patient were reviewed by me and considered in my medical decision making (see chart for details).    MDM Rules/Calculators/A&P  Patient presenting here with complaints of abdominal pain as described in the HPI.  She has history of metastatic breast cancer with multiple liver lesions.    Patient's work-up today shows a slight leukocytosis with white count of 13.5, but no obvious abnormality on her CT scan to explain her discomfort.  She does have multiple metastatic lesions throughout the liver and suspect this may be the cause of her discomfort.  Patient's pain was treated here in the ER, but seems appropriate for discharge and  outpatient follow-up.  She has an appointment with her oncologist on Wednesday.  I will prescribe medication for pain.  Final Clinical Impression(s) / ED Diagnoses Final diagnoses:  None    Rx / DC Orders ED Discharge Orders     None        Veryl Speak, MD 07/17/21 [phone]

## 2021-07-17 NOTE — Discharge Instructions (Signed)
Begin taking Percocet as prescribed as needed for pain.  Follow-up with your oncologist as scheduled, and return to the ER if you develop worsening pain, high fever, bloody stools, or other new and concerning symptoms.

## 2021-07-17 NOTE — ED Triage Notes (Signed)
Pt arrived via POV, c/o diffuse abd pain that woke her approx 2am this morning with some nausea. Denies any v/d or urinary sx. Currently undergoing chemo tx.

## 2021-07-17 NOTE — ED Notes (Signed)
Patient back from CT.

## 2021-07-17 NOTE — ED Notes (Signed)
Patient transported to CT 

## 2021-07-21 MED FILL — Fosaprepitant Dimeglumine For IV Infusion 150 MG (Base Eq): INTRAVENOUS | Qty: 5 | Status: AC

## 2021-07-21 MED FILL — Dexamethasone Sodium Phosphate Inj 100 MG/10ML: INTRAMUSCULAR | Qty: 1 | Status: AC

## 2021-07-22 ENCOUNTER — Inpatient Hospital Stay: Payer: 59

## 2021-07-22 ENCOUNTER — Inpatient Hospital Stay (HOSPITAL_BASED_OUTPATIENT_CLINIC_OR_DEPARTMENT_OTHER): Payer: 59 | Admitting: Adult Health

## 2021-07-22 ENCOUNTER — Encounter: Payer: Self-pay | Admitting: Adult Health

## 2021-07-22 ENCOUNTER — Other Ambulatory Visit: Payer: Self-pay

## 2021-07-22 ENCOUNTER — Other Ambulatory Visit: Payer: 59

## 2021-07-22 ENCOUNTER — Telehealth: Payer: Self-pay | Admitting: *Deleted

## 2021-07-22 VITALS — BP 115/81 | HR 79 | Temp 97.8°F | Resp 18 | Ht 61.0 in | Wt 223.5 lb

## 2021-07-22 DIAGNOSIS — C7951 Secondary malignant neoplasm of bone: Secondary | ICD-10-CM | POA: Diagnosis not present

## 2021-07-22 DIAGNOSIS — C50411 Malignant neoplasm of upper-outer quadrant of right female breast: Secondary | ICD-10-CM

## 2021-07-22 DIAGNOSIS — Z170 Estrogen receptor positive status [ER+]: Secondary | ICD-10-CM

## 2021-07-22 DIAGNOSIS — C787 Secondary malignant neoplasm of liver and intrahepatic bile duct: Secondary | ICD-10-CM | POA: Diagnosis not present

## 2021-07-22 DIAGNOSIS — Z5112 Encounter for antineoplastic immunotherapy: Secondary | ICD-10-CM | POA: Diagnosis not present

## 2021-07-22 DIAGNOSIS — Z95828 Presence of other vascular implants and grafts: Secondary | ICD-10-CM

## 2021-07-22 LAB — CBC WITH DIFFERENTIAL/PLATELET
Abs Immature Granulocytes: 0.03 10*3/uL (ref 0.00–0.07)
Basophils Absolute: 0 10*3/uL (ref 0.0–0.1)
Basophils Relative: 0 %
Eosinophils Absolute: 0.2 10*3/uL (ref 0.0–0.5)
Eosinophils Relative: 9 %
HCT: 42.4 % (ref 36.0–46.0)
Hemoglobin: 14.6 g/dL (ref 12.0–15.0)
Immature Granulocytes: 1 %
Lymphocytes Relative: 27 %
Lymphs Abs: 0.6 10*3/uL — ABNORMAL LOW (ref 0.7–4.0)
MCH: 27.5 pg (ref 26.0–34.0)
MCHC: 34.4 g/dL (ref 30.0–36.0)
MCV: 79.8 fL — ABNORMAL LOW (ref 80.0–100.0)
Monocytes Absolute: 0.3 10*3/uL (ref 0.1–1.0)
Monocytes Relative: 12 %
Neutro Abs: 1.1 10*3/uL — ABNORMAL LOW (ref 1.7–7.7)
Neutrophils Relative %: 51 %
Platelets: 160 10*3/uL (ref 150–400)
RBC: 5.31 MIL/uL — ABNORMAL HIGH (ref 3.87–5.11)
RDW: 12.6 % (ref 11.5–15.5)
WBC: 2.2 10*3/uL — ABNORMAL LOW (ref 4.0–10.5)
nRBC: 0 % (ref 0.0–0.2)

## 2021-07-22 LAB — COMPREHENSIVE METABOLIC PANEL
ALT: 104 U/L — ABNORMAL HIGH (ref 0–44)
AST: 66 U/L — ABNORMAL HIGH (ref 15–41)
Albumin: 3 g/dL — ABNORMAL LOW (ref 3.5–5.0)
Alkaline Phosphatase: 944 U/L — ABNORMAL HIGH (ref 38–126)
Anion gap: 7 (ref 5–15)
BUN: 12 mg/dL (ref 6–20)
CO2: 27 mmol/L (ref 22–32)
Calcium: 8.9 mg/dL (ref 8.9–10.3)
Chloride: 98 mmol/L (ref 98–111)
Creatinine, Ser: 0.64 mg/dL (ref 0.44–1.00)
GFR, Estimated: >60 mL/min (ref >60)
Glucose, Bld: 260 mg/dL — ABNORMAL HIGH (ref 70–99)
Potassium: 4.2 mmol/L (ref 3.5–5.1)
Sodium: 132 mmol/L — ABNORMAL LOW (ref 135–145)
Total Bilirubin: 2.9 mg/dL — ABNORMAL HIGH (ref 0.3–1.2)
Total Protein: 6.7 g/dL (ref 6.5–8.1)

## 2021-07-22 MED ORDER — SODIUM CHLORIDE 0.9% FLUSH
10.0000 mL | INTRAVENOUS | Status: DC | PRN
  Administered 2021-07-22: 10 mL

## 2021-07-22 MED ORDER — HEPARIN SOD (PORK) LOCK FLUSH 100 UNIT/ML IV SOLN
500.0000 [IU] | Freq: Once | INTRAVENOUS | Status: AC | PRN
  Administered 2021-07-22: 500 [IU]

## 2021-07-22 MED ORDER — DIPHENHYDRAMINE HCL 50 MG/ML IJ SOLN
25.0000 mg | Freq: Once | INTRAMUSCULAR | Status: AC
  Administered 2021-07-22: 25 mg via INTRAVENOUS
  Filled 2021-07-22: qty 1

## 2021-07-22 MED ORDER — SODIUM CHLORIDE 0.9 % IV SOLN
10.0000 mg | Freq: Once | INTRAVENOUS | Status: AC
  Administered 2021-07-22: 10 mg via INTRAVENOUS
  Filled 2021-07-22: qty 10

## 2021-07-22 MED ORDER — FAMOTIDINE 20 MG IN NS 100 ML IVPB
20.0000 mg | Freq: Once | INTRAVENOUS | Status: AC
  Administered 2021-07-22: 20 mg via INTRAVENOUS
  Filled 2021-07-22: qty 100

## 2021-07-22 MED ORDER — SODIUM CHLORIDE 0.9 % IV SOLN
150.0000 mg | Freq: Once | INTRAVENOUS | Status: AC
  Administered 2021-07-22: 150 mg via INTRAVENOUS
  Filled 2021-07-22: qty 150

## 2021-07-22 MED ORDER — SODIUM CHLORIDE 0.9% FLUSH
10.0000 mL | Freq: Once | INTRAVENOUS | Status: AC
  Administered 2021-07-22: 10 mL

## 2021-07-22 MED ORDER — SODIUM CHLORIDE 0.9 % IV SOLN: Freq: Once | INTRAVENOUS | Status: AC

## 2021-07-22 MED ORDER — PALONOSETRON HCL INJECTION 0.25 MG/5ML
0.2500 mg | Freq: Once | INTRAVENOUS | Status: AC
  Administered 2021-07-22: 0.25 mg via INTRAVENOUS
  Filled 2021-07-22: qty 5

## 2021-07-22 MED ORDER — SODIUM CHLORIDE 0.9 % IV SOLN
10.0000 mg/kg | Freq: Once | INTRAVENOUS | Status: AC
  Administered 2021-07-22: 1080 mg via INTRAVENOUS
  Filled 2021-07-22: qty 108

## 2021-07-22 MED ORDER — ACETAMINOPHEN 325 MG PO TABS
650.0000 mg | ORAL_TABLET | Freq: Once | ORAL | Status: AC
  Administered 2021-07-22: 650 mg via ORAL
  Filled 2021-07-22: qty 2

## 2021-07-22 NOTE — Telephone Encounter (Signed)
Per MD review of noted abnormal AST,ALT, Alk phos and T bili as well as ANC of 1.1- gave ok to proceed with treatment with d8 of cycle 1 Trodelvy.   

## 2021-07-22 NOTE — Patient Instructions (Signed)
[facility]   Discharge Instructions: Thank you for choosing [facility] to provide your oncology and hematology care.   If you have a lab appointment with the [facility], please go directly to the [facility] and check in at the registration area.   Wear comfortable clothing and clothing appropriate for easy access to any Portacath or PICC line.   We strive to give you quality time with your provider. You may need to reschedule your appointment if you arrive late (15 or more minutes).  Arriving late affects you and other patients whose appointments are after yours.  Also, if you miss three or more appointments without notifying the office, you may be dismissed from the clinic at the provider's discretion.      For prescription refill requests, have your pharmacy contact our office and allow 72 hours for refills to be completed.    Today you received the following chemotherapy and/or immunotherapy agents: sacituzumab govitecan.      To help prevent nausea and vomiting after your treatment, we encourage you to take your nausea medication as directed.  BELOW ARE SYMPTOMS THAT SHOULD BE REPORTED IMMEDIATELY: *FEVER GREATER THAN 100.4 F (38 C) OR HIGHER *CHILLS OR SWEATING *NAUSEA AND VOMITING THAT IS NOT CONTROLLED WITH YOUR NAUSEA MEDICATION *UNUSUAL SHORTNESS OF BREATH *UNUSUAL BRUISING OR BLEEDING *URINARY PROBLEMS (pain or burning when urinating, or frequent urination) *BOWEL PROBLEMS (unusual diarrhea, constipation, pain near the anus) TENDERNESS IN MOUTH AND THROAT WITH OR WITHOUT PRESENCE OF ULCERS (sore throat, sores in mouth, or a toothache) UNUSUAL RASH, SWELLING OR PAIN  UNUSUAL VAGINAL DISCHARGE OR ITCHING   Items with * indicate a potential emergency and should be followed up as soon as possible or go to the Emergency Department if any problems should occur.  Please show the CHEMOTHERAPY ALERT CARD or IMMUNOTHERAPY ALERT CARD  at check-in to the Emergency Department and triage nurse.  Should you have questions after your visit or need to cancel or reschedule your appointment, please contact [facility]  Dept: [phone]  and follow the prompts.  Office hours are 8:00 a.m. to 4:30 p.m. Monday - Friday. Please note that voicemails left after 4:00 p.m. may not be returned until the following business day.  We are closed weekends and major holidays. You have access to a nurse at all times for urgent questions. Please call the main number to the clinic Dept: [phone] and follow the prompts.   For any non-urgent questions, you may also contact your provider using MyChart. We now offer e-Visits for anyone 47 and older to request care online for non-urgent symptoms. For details visit [email or web address].   Also download the MyChart app! Go to the app store, search "MyChart", open the app, select [facility], and log in with your MyChart username and password.  Due to Covid, a mask is required upon entering the hospital/clinic. If you do not have a mask, one will be given to you upon arrival. For doctor visits, patients may have 1 support person aged 84 or older with them. For treatment visits, patients cannot have anyone with them due to current Covid guidelines and our immunocompromised population.

## 2021-07-22 NOTE — Progress Notes (Signed)
[place]  [phone] [phone]) [phone]    ID: MYRICAL ANDUJO DOB: 10/07/1971  MR#: [id number]  CHE#:[id number]  Patient Care Team: Jamie Kato as PCP - General (Family Medicine) Mauro Kaufmann, RN as Oncology Nurse Navigator [name], RN as Oncology Nurse Navigator Erroll Luna, MD as Consulting Physician (General Surgery) Magrinat, Virgie Dad, MD as Consulting Physician (Oncology) Kyung Rudd, MD as Consulting Physician (Radiation Oncology) Gaye Pollack, MD as Consulting Physician (Cardiothoracic Surgery) Serafina Mitchell, MD as Consulting Physician (Vascular Surgery) Benedict Needy, MD as Referring Physician (Hematology and Oncology) Scot Dock, NP OTHER MD:  CHIEF COMPLAINT: weakly estrogen receptor positive (functionally triple negative) metastatic breast cancer  CURRENT TREATMENT: Trodelvy day 1 and 8 every 21 days; zoledronate every 12 weeks; rivaroxaban   INTERVAL HISTORY: Jnyah returns today for follow-up and treatment of her metastatic breast cancer.    To review, she had progression on a recent liver MRI completed on 06/30/2021 and was changed to Banks given on day 1 and 8 of a 21 day cycle.  Today is cycle 1 day 8 of therapy.  She notes that initially after receiving it she experienced some fatigue, but today she is feeling better.  She also noted some abdominal pain on 10/7 that was so severe she went to the ER.  Her pain was treated and she received a percocet prescription.  She has not needed to take the percocet since then.    She also notes that she has been constipated this week.  She has taken senokkot and  dulcolax, but has not had a bm in a couple of days.    She says that on days she isn't significantly fatigued she is active and doing her normal activities.  She says that she has a mild decrease in her appetite, but nothing significant.    Her last zoledronate was 06/03/2021  Next dose will be  in November.  We are following her CA 27-29  Results for SHANIELLE, CORRELL (MRN [id number]) as of 07/22/2021 10:51  Ref. Range 04/23/2021 08:00 05/13/2021 08:39 06/03/2021 08:35 06/22/2021 09:26 07/15/2021 08:26  CA 27.29 Latest Ref Range: 0.0 - 38.6 U/mL 40.8 (H) 50.3 (H) 60.3 (H) 126.2 (H) 304.2 (H)    We are also following her TSH. She was previously receiving Pembrolizumab and had hypothyroidism related to treatment.  She was started on Levothyroxine, and her levels have been as follows.   Results for ANNETH, BRUNELL (MRN [id number]) as of 07/22/2021 10:51  Ref. Range 04/23/2021 08:00 05/13/2021 08:39 06/03/2021 08:35 06/22/2021 09:26 07/15/2021 08:27  TSH Latest Ref Range: 0.308 - 3.960 uIU/mL 18.191 (H) 18.800 (H) 4.599 (H) 2.725 0.906    REVIEW OF SYSTEMS: Review of Systems  Constitutional:  Positive for fatigue. Negative for appetite change, chills, fever and unexpected weight change.  HENT:   Negative for hearing loss, lump/mass and trouble swallowing.   Eyes:  Negative for eye problems and icterus.  Respiratory:  Negative for chest tightness, cough and shortness of breath.   Cardiovascular:  Negative for chest pain, leg swelling and palpitations.  Gastrointestinal:  Positive for constipation. Negative for abdominal distention, abdominal pain, diarrhea, nausea and vomiting.  Endocrine: Negative for hot flashes.  Genitourinary:  Negative for difficulty urinating.   Musculoskeletal:  Negative for arthralgias.  Skin:  Negative for itching and rash.  Neurological:  Negative for dizziness, extremity weakness, headaches and numbness.  Hematological:  Negative for adenopathy. Does not  bruise/bleed easily.  Psychiatric/Behavioral:  Negative for depression. The patient is not nervous/anxious.   COVID 19 VACCINATION STATUS: refuses vaccination, had COVID 29 September 2019   HISTORY OF CURRENT ILLNESS: From the original intake note:  RASHA IBE had routine screening mammography on 04/27/2019  showing a possible abnormality in the right breast. She underwent right diagnostic mammography with tomography and right breast ultrasonography at [facility] on 05/01/2019 showing: breast density category C; highly suspicious 1.7 cm irregular mass in the right breast at 10 o'clock, 8 cm from the nipple; indeterminate hypoechoic round mass in the right axillary tail/low axilla. Physical exam showed no suspicious lumps.  Accordingly on 05/02/2019 she proceeded to biopsy of the right breast area in question. The pathology from this procedure ([id number]) showed: invasive ductal carcinoma, grade 3; ductal carcinoma in situ; lymphovascular invasion present. Prognostic indicators significant for: estrogen receptor, 30% positive with moderate staining intensity, and progesterone receptor, 30% positive with strong staining intensity. Proliferation marker Ki67 at 20%. HER2 negative by immunohistochemistry (1+).  The second "satellite" lesion was a lymph node which was also positive.  The patient's subsequent history is as detailed below.   PAST MEDICAL HISTORY: Past Medical History:  Diagnosis Date   Cancer ([place]) 2020   breast Cancer, 2022 liver mets   Chronic low back pain with left-sided sciatica    Family history of leukemia    Family history of stomach cancer    Hypertension    Personal history of chemotherapy    Personal history of radiation therapy    finished june '21    PAST SURGICAL HISTORY: Past Surgical History:  Procedure Laterality Date   APPLICATION OF WOUND VAC N/A 10/16/2019   Procedure: APPLICATION OF WOUND VAC;  Surgeon: Gaye Pollack, MD;  Location: [facility];  Service: Vascular;  Laterality: N/A;   BREAST BIOPSY Right 05/02/2019   right clips X2   BREAST LUMPECTOMY Right    06/2019   BREAST LUMPECTOMY WITH RADIOACTIVE SEED AND SENTINEL LYMPH NODE BIOPSY Right 06/12/2019   Procedure: RIGHT BREAST RADIOACTIVE SEED LUMPECTOMY  AND RIGHT AXILLARY SEED TARGETED LYMPH NODE AND  AXILLARY SENTINEL LYMPH NODE Loop;  Surgeon: Erroll Luna, MD;  Location: [facility];  Service: General;  Laterality: Right;  PEC BLOCK   C/S x2  '98 '[place]  10/02/2019   Procedure: Insertion Central Line Adult;  Surgeon: Serafina Mitchell, MD;  Location: [facility];  Service: Vascular;;   CESAREAN SECTION     CHOLECYSTECTOMY     I & D EXTREMITY N/A 10/16/2019   Procedure: DEBRIDEMENT of DEHISCED MIDLINE SURGICAL INCISION;  Surgeon: Gaye Pollack, MD;  Location: [facility];  Service: Vascular;  Laterality: N/A;   IR IMAGING GUIDED PORT INSERTION  11/24/2020   LAPAROSCOPIC ASSISTED VAGINAL HYSTERECTOMY  05/17/2011   Procedure: LAPAROSCOPIC ASSISTED VAGINAL HYSTERECTOMY;  Surgeon: Sharene Butters;  Location: [facility];  Service: Gynecology;  Laterality: N/A;  Laparoscopic Assisted Vaginal Hysterectomy With Lysis Of Adhesions   PERICARDIAL WINDOW N/A 10/02/2019   Procedure: Pericardial Window;  Surgeon: Serafina Mitchell, MD;  Location: [facility];  Service: Vascular;  Laterality: N/A;   PORT-A-CATH REMOVAL  10/02/2019   Procedure: Removal Port-A-Cath;  Surgeon: Serafina Mitchell, MD;  Location: [facility];  Service: Vascular;;   PORTACATH PLACEMENT Right 06/12/2019   Procedure: INSERTION PORT-A-CATH WITH ULTRASOUND;  Surgeon: Erroll Luna, MD;  Location: [facility];  Service: General;  Laterality: Right;  RE-EXCISION OF BREAST LUMPECTOMY Right 07/17/2019   Procedure: RE-EXCISION OF RIGHT BREAST LUMPECTOMY;  Surgeon: Erroll Luna, MD;  Location: [facility];  Service: General;  Laterality: Right;   TUBAL LIGATION     ULTRASOUND GUIDANCE FOR VASCULAR ACCESS  10/02/2019   Procedure: Ultrasound Guidance For Vascular Access;  Surgeon: Serafina Mitchell, MD;  Location: [facility];  Service: Vascular;;   VENOGRAM N/A 10/02/2019   Procedure: Central VENOGRAM;  Surgeon: Serafina Mitchell, MD;  Location: [facility];  Service: Vascular;  Laterality: N/A;     FAMILY HISTORY: Family History  Problem Relation Age of Onset   Arthritis Mother    COPD Mother    Hypertension Mother    Hyperlipidemia Mother    Leukemia Brother 17   Stomach cancer Maternal Grandmother        late 21s  Patient's parents are living (as of September 2020). Her father is 71 and her mother is 84 as of 04/2019. The patient denies a family hx of breast or ovarian cancer. She has 7 siblings, 6 brothers and 1 sister. She reports her maternal grandmother was diagnosed with stomach cancer at age 89. Her brother was diagnosed with leukemia at age 15.   GYNECOLOGIC HISTORY:  No LMP recorded. Patient has had a hysterectomy. Menarche: 50 years old Age at first live birth: 50 years old GX P 2 (+1 stillborn) LMP age 39-42 Contraceptive: unsure, maybe for a year at age 55. HRT no Hysterectomy? Yes, 05/2011 BSO? 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 She is married. Husband Sheppard Plumber") is a Hydrologist for a telephone company. She lives at home with her husband and two daughters. Daughter Suszanne Conners, age 87 is a Scientist, water quality. Daughter Jarrett Soho, age 23, is a Ship broker.  She graduated from high school June 5053 and has a Actor, hoping to become an Therapist, sports    ADVANCED DIRECTIVES: Husband Heath Lark is her HCPOA.   HEALTH MAINTENANCE: Social History   Tobacco Use   Smoking status: Never   Smokeless tobacco: Never  Vaping Use   Vaping Use: Never used  Substance Use Topics   Alcohol use: Not Currently    Alcohol/week: 2.0 standard drinks    Types: 2 Standard drinks or equivalent per week    Comment: "2-3 a week"   Drug use: No     Colonoscopy: n/a  PAP: 02/2018  Bone density: n/a   No Known Allergies  Current Outpatient Medications  Medication Sig Dispense Refill   Cholecalciferol (VITAMIN D3) 25 MCG (1000 UT) CHEW Chew 1 tablet by mouth daily.      dexamethasone (DECADRON) 4 MG tablet Take 2 tablets (8 mg) daily for  3 days after chemotherapy. Take with food. 30 tablet 1   ketoconazole (NIZORAL) 2 % cream Apply 1 application topically daily. (Patient taking differently: Apply 1 application topically daily as needed.) 15 g 4   levothyroxine (SYNTHROID) 100 MCG tablet Take 1 tablet (100 mcg total) by mouth daily before breakfast. 30 tablet 6   loperamide (IMODIUM A-D) 2 MG tablet Take 2 at diarrhea onset, then 1 every 2hr until 12hrs with no BM. May take 2 every 4hrs at night. If diarrhea recurs repeat. 100 tablet 1   metoprolol tartrate (LOPRESSOR) 25 MG tablet Take 1 tablet (25 mg total) by mouth 2 (two) times daily. 60 tablet 0   oxyCODONE-acetaminophen (PERCOCET) 5-325 MG tablet Take 1-2 tablets by mouth every 6 (six) hours as  needed. 20 tablet 0   prochlorperazine (COMPAZINE) 10 MG tablet Take 1 tablet (10 mg total) by mouth every 6 (six) hours as needed (Nausea or vomiting). 30 tablet 1   rivaroxaban (XARELTO) 20 MG TABS tablet Take 1 tablet (20 mg total) by mouth daily with supper. 90 tablet 3   No current facility-administered medications for this visit.   Facility-Administered Medications Ordered in Other Visits  Medication Dose Route Frequency Provider Last Rate Last Admin   sodium chloride flush (NS) 0.9 % injection 10 mL  10 mL Intravenous PRN Magrinat, Virgie Dad, MD   10 mL at 03/04/21 0935    OBJECTIVE: white woman who appears stated age  There were no vitals filed for this visit.   Wt Readings from Last 3 Encounters:  07/15/21 225 lb 1.6 oz (102.1 kg)  07/02/21 227 lb 12.8 oz (103.3 kg)  06/29/21 227 lb (103 kg)   There is no height or weight on file to calculate BMI.    ECOG FS:1 - Symptomatic but completely ambulatory GENERAL: Patient is a well appearing female in no acute distress HEENT:  Sclerae anicteric.  Oropharynx clear and moist. No ulcerations or evidence of oropharyngeal candidiasis. Neck is supple.  NODES:  No cervical, supraclavicular, or axillary lymphadenopathy palpated.   BREAST EXAM:  Deferred. LUNGS:  Clear to auscultation bilaterally.  No wheezes or rhonchi. HEART:  Regular rate and rhythm. No murmur appreciated. ABDOMEN:  Soft, nontender.  Positive, normoactive bowel sounds. No organomegaly palpated. MSK:  No focal spinal tenderness to palpation. Full range of motion bilaterally in the upper extremities. EXTREMITIES:  No peripheral edema.   SKIN:  Clear with no obvious rashes or skin changes. No nail dyscrasia. NEURO:  Nonfocal. Well oriented.  Appropriate affect.   LAB RESULTS:  CMP     Component Value Date/Time   NA 133 (L) 07/17/2021 0507   K 4.7 07/17/2021 0507   CL 103 07/17/2021 0507   CO2 20 (L) 07/17/2021 0507   GLUCOSE 333 (H) 07/17/2021 0507   BUN 24 (H) 07/17/2021 0507   CREATININE 0.69 07/17/2021 0507   CREATININE 0.77 06/22/2021 1049   CALCIUM 9.5 07/17/2021 0507   PROT 7.2 07/17/2021 0507   ALBUMIN 3.3 (L) 07/17/2021 0507   AST 122 (H) 07/17/2021 0507   AST 89 (H) 06/22/2021 1049   ALT 106 (H) 07/17/2021 0507   ALT 108 (H) 06/22/2021 1049   ALKPHOS 854 (H) 07/17/2021 0507   BILITOT 1.5 (H) 07/17/2021 0507   BILITOT 1.1 06/22/2021 1049   GFRNONAA >60 07/17/2021 0507   GFRNONAA >60 06/22/2021 1049   GFRAA >60 04/24/2020 1035    No results found for: TOTALPROTELP, ALBUMINELP, A1GS, A2GS, BETS, BETA2SER, GAMS, MSPIKE, SPEI  No results found for: KPAFRELGTCHN, LAMBDASER, KAPLAMBRATIO  Lab Results  Component Value Date   WBC 2.2 (L) 07/22/2021   NEUTROABS 1.1 (L) 07/22/2021   HGB 14.6 07/22/2021   HCT 42.4 07/22/2021   MCV 79.8 (L) 07/22/2021   PLT 160 07/22/2021   No results found for: LABCA2  No components found for: TDVVOH607  No results for input(s): INR in the last 168 hours.  No results found for: LABCA2  No results found for: PXT062  No results found for: IRS854  No results found for: OEV035  Lab Results  Component Value Date   CA2729 304.2 (H) 07/15/2021    No components found for:  HGQUANT  Lab Results  Component Value Date   CEA1 2.45 10/29/2020   /  CEA ([facility])  Date Value Ref Range Status  10/29/2020 2.45 0.00 - 5.00 ng/mL Final    Comment:    (NOTE) This test was performed using Architect's Chemiluminescent Microparticle Immunoassay. Values obtained from different assay methods cannot be used interchangeably. Please note that 5-10% of patients who smoke may see CEA levels up to 6.9 ng/mL. Performed at [facility], [place] [address], [place], [place] [address]      No results found for: AFPTUMOR  No results found for: CHROMOGRNA  No results found for: HGBA, HGBA2QUANT, HGBFQUANT, HGBSQUAN (Hemoglobinopathy evaluation)   Lab Results  Component Value Date   LDH 170 09/30/2019    No results found for: IRON, TIBC, IRONPCTSAT (Iron and TIBC)  Lab Results  Component Value Date   FERRITIN 498 (H) 10/19/2019    Urinalysis    Component Value Date/Time   COLORURINE YELLOW 07/17/2021 0516   APPEARANCEUR CLEAR 07/17/2021 0516   LABSPEC 1.014 07/17/2021 0516   PHURINE 6.0 07/17/2021 0516   GLUCOSEU 150 (A) 07/17/2021 0516   HGBUR NEGATIVE 07/17/2021 0516   BILIRUBINUR NEGATIVE 07/17/2021 0516   KETONESUR NEGATIVE 07/17/2021 0516   PROTEINUR NEGATIVE 07/17/2021 0516   NITRITE NEGATIVE 07/17/2021 0516   LEUKOCYTESUR NEGATIVE 07/17/2021 0516    STUDIES: CT ABDOMEN PELVIS W CONTRAST  Result Date: 07/17/2021 CLINICAL DATA:  50 year old female with history of abdominal pain. Suspected abdominal infection or abscess. History of breast cancer with liver metastasis. Ongoing chemotherapy. EXAM: CT ABDOMEN AND PELVIS WITH CONTRAST TECHNIQUE: Multidetector CT imaging of the abdomen and pelvis was performed using the standard protocol following bolus administration of intravenous contrast. CONTRAST:  45m OMNIPAQUE IOHEXOL 350 MG/ML SOLN COMPARISON:  No prior CT the abdomen and pelvis. Abdominal MRI 06/30/2021. Chest CTA  10/07/2020. FINDINGS: Lower chest: Scarring in the periphery of the right lower lobe, similar to prior chest CTA 10/07/2020. Diffuse skin thickening in the right breast. Hepatobiliary: Although difficult to compare with prior abdominal MRI 06/30/2021 given the differences in modalities, there does appear to be an increase in number and size of numerous hypovascular hepatic lesions on today's examination, concerning for progression of metastatic disease to the liver. Amongst the discrete lesions, specific examples of enlarging lesions include a 2.8 x 1.9 cm lesion in segment 2 (axial image 8 of series 2) which previously measured 2.2 x 1.3 cm on axial image 8 of series 2 of the prior MRI examination 06/30/2021. Another lesion which has clearly progressed is in the right lobe of the liver in segment 6 (axial image 32 of series 2) which currently measures 2.9 x 2.1 cm (previously approximately 1.5 x 1.1 cm on axial image 23 of series 2). No intra or extrahepatic biliary ductal dilatation. Status post cholecystectomy. Pancreas: No pancreatic mass. No pancreatic ductal dilatation. No pancreatic or peripancreatic fluid collections or inflammatory changes. Spleen: Unremarkable. Adrenals/Urinary Tract: Bilateral kidneys and bilateral adrenal glands are normal in appearance. No hydroureteronephrosis. Urinary bladder is normal in appearance. Stomach/Bowel: The appearance of the stomach is normal. There is no pathologic dilatation of small bowel or colon. Normal appendix. Vascular/Lymphatic: No significant atherosclerotic disease, aneurysm or dissection noted in the abdominal or pelvic vasculature. No lymphadenopathy noted in the abdomen or pelvis. Reproductive: Status post hysterectomy. Ovaries are unremarkable in appearance. Other: Trace volume of ascites in the low anatomic pelvis. No pneumoperitoneum. Musculoskeletal: Sclerotic lesions are noted throughout the visualized axial and appendicular skeleton, concerning for  metastatic disease to the bones, largest of which is in  the left femoral neck measuring approximately 3.2 x 2.7 x 5.5 cm (axial image 87 of series 2 and coronal image 74 of series 4). IMPRESSION: 1. Today's study demonstrates probable progression of metastatic disease throughout the liver, as discussed above. 2. No other acute findings are noted in the abdomen or pelvis to account for the patient's symptoms. 3. Trace volume of ascites. 4. Sclerotic lesions in the bones concerning for metastatic disease, largest of which is in the left femoral neck. Electronically Signed   By: Vinnie Langton M.D.   On: 07/17/2021 06:46   MR LIVER W WO CONTRAST  Result Date: 06/30/2021 CLINICAL DATA:  A 50 year old female with history of breast cancer, assess treatment response. Found to have metastatic disease to the liver on previous imaging. EXAM: MRI ABDOMEN WITHOUT AND WITH CONTRAST TECHNIQUE: Multiplanar multisequence MR imaging of the abdomen was performed both before and after the administration of intravenous contrast. CONTRAST:  66m GADAVIST GADOBUTROL 1 MMOL/ML IV SOLN COMPARISON:  April 29, 2021. FINDINGS: Lower chest: No effusion. No consolidative changes. Limited assessment of the lung bases on MRI. Potential peripheral nodularity in the RIGHT chest may represent scarring is airways a similar focus seen on previous chest CT from December of 2021, this areas not well assessed on the MRI evaluation. Hepatobiliary: Interval worsening of disease in the liver common near confluent disease seen in the superior RIGHT hepatic lobe. Comparison with diffusion shows this to best advantage. Disease in hepatic subsegment IV and VIII in particular. Geographic area with peripheral satellite nodules as well. Dominant area measuring approximately 7.4 x 4.7 cm as compared to 2.9 x 2.7 cm (image 46/4) This is confluent with the lesion that was seen in the medial section of the LEFT hepatic lobe along the fissure for false form  ligament which now measures approximately 5.0 x 4.0 cm as compared to 3.3 x 2.1 cm. Numerous new lesions throughout the liver, essentially too numerous to count also best displayed on diffusion-weighted imaging. Example of new hepatic lesion in hepatic subsegment II measuring approximately 2 cm greatest dimension not seen on the prior study. Precontrast T1 weighted imaging (series 17) also shows these numerous lesions with better definition even than postcontrast images. Background hepatic steatosis, mild. Post cholecystectomy without substantial biliary duct dilation. The portal vein remains patent. Pancreas: Normal intrinsic T1 signal. No ductal dilation or sign of inflammation. No focal lesion. Spleen:  Spleen normal size and contour. Adrenals/Urinary Tract: Adrenal glands are normal. No suspicious renal lesion. No hydronephrosis. No perinephric stranding. Stomach/Bowel: Unremarkable to the extent evaluated on abdominal MRI. Vascular/Lymphatic: No pathologically enlarged lymph nodes identified. No abdominal aortic aneurysm demonstrated. Other:  No ascites. Musculoskeletal: No suspicious bone lesions identified. IMPRESSION: Marked interval worsening of disease in the liver with numerous new lesions and enlarging existing disease. Potential peripheral nodularity in the RIGHT chest may represent scarring is airways, a similar focus seen on previous chest CT from December of 2021, this area is not well assessed on the MRI evaluation. Consider attention on follow-up chest imaging. Electronically Signed   By: GZetta BillsM.D.   On: 06/30/2021 15:38     ELIGIBLE FOR AVAILABLE RESEARCH PROTOCOL: AET  ASSESSMENT: 50y.o. [place], [place] woman status post right breast upper outer quadrant biopsy 05/01/2019 for a clinical T1c N1, stage IIA invasive ductal carcinoma, grade 3, estrogen and progesterone receptor positive, HER-2 nonamplified, with an MIB-1-1 of 20%.  (a) mass in the axillary tail was a positive lymph  node  (1)  MammaPrint obtained from the original biopsy shows a high risk luminal B tumor and predicts a 5-year disease-free survival of 91% in her case  (2) genetics testing 05/09/2019 through the Common Hereditary Gene Panel offered by [facility] found no deleterious mutations in APC, ATM, AXIN2, BARD1, BMPR1A, BRCA1, BRCA2, BRIP1, CDH1, CDK4, CDKN2A (p14ARF), CDKN2A (p16INK4a), CHEK2, CTNNA1, DICER1, EPCAM (Deletion/duplication testing only), GREM1 (promoter region deletion/duplication testing only), KIT, MEN1, MLH1, MSH2, MSH3, MSH6, MUTYH, NBN, NF1, NHTL1, PALB2, PDGFRA, PMS2, POLD1, POLE, PTEN, RAD50, RAD51C, RAD51D, RNF43, SDHB, SDHC, SDHD, SMAD4, SMARCA4. STK11, TP53, TSC1, TSC2, and VHL.  The following genes were evaluated for sequence changes only: SDHA and HOXB13 c.251G>A variant only.   (a) A variant of uncertain significance (VUS) was detected in one of her MSH6 genes (c.831A>C).   (3) status post right lumpectomy and sentinel lymph node sampling 06/12/2019 for a pT2 pN1, stage IIA invasive ductal carcinoma, grade 2, with positive margins  (a) a total of 4 sentinel lymph nodes removed, one positive (with ECE), ine itc  (b) margin clearance 04/19/2019 successful (medial margin close but negative for DCIS)   (4) adjuvant chemotherapy consisting of doxorubicin and cyclophosphamide in dose dense fashion x4 starting 07/10/2019, completed 09/24/2019; planned weekly paclitaxel x12 omitted   (a) echo 06/26/2019 shows an EF of 60-65%  (b echo on 09/19/2019 shows an EF of 60-65%  (c) chemotherapy stopped after four cycles of doxorubicin and cyclophosphamide due to #5 below  (5) Multiple VTE/PE documented 10/02/2019, on intravenous heparin initially, then Xarelto started 10/19/2019  (a) presented with SVC syndrome and bilateral pulmonary emboli on 09/28/2019  (b) s/p SVC thrombectomy and port removal 77/93/9030 complicated by hemopericardium and tamponade, necessitating pericardial window  placement   (c) postop course complicated by wound dehiscence requiring wound VAC  (c) Doppler 10/03/2019 showed right lower extremity DVT  (d) CT angio and Doppler of both upper extremities 10/13/2019 found persistent acute bilateral pulmonary emboli, persistent but improved SVC thrombus, bilateral pleural effusions, and right lower lobe collapse. Bilateral upper extremity DVTs also noted  (e) Dopplers upper extremity 11/19/2019 showed clots cleared on the left, chronic DVT right internal jugular and axillary veins  (f) CT angio of the chest 10/07/2020 shows resolution of earlier pleural effusions and no evidence of active embolism (but see #9 below)  (6) COVID-19 infection documented 09/27/2020, status post remdesivir   (7) adjuvant radiation 12/24/19 - 02/06/20  Site/dose:   The patient initially received a dose of 50.4 Gy in 28 fractions to the breast and SCLV region using a 4-field approach. This was delivered using a 3-D conformal technique. The patient then received a boost to the seroma. This delivered an additional 10 Gy in 5 fractions using a 3-field photon boost technique. The total dose was 60.4 Gy.   (8) anastrozole started mid May 2021  (a) [facility] and estradiol obtained 11/15/2019 consistent with menopause  (b) not a good candidate for tamoxifen given history of DVT/PE above  (c) bone density 07/31/2020 normal (T score equals 0.1).  METASTATIC DISEASE: Jan 2022 (9) CT angio of the chest 12/28/2021shows new liver lesions (not seen on CT angio January 2021)  (a) MRI 10/22/2020 shows multiple liver lesions, the largest 2.5 cm  (b)   liver biopsy 11/03/2020 confirms metastatic carcinoma estrogen receptor 10% weakly positive, HER-2 and progesterone receptor negative (functionally triple negative metastatic breast cancer).  (c) bone scan 11/25/2020 shows left femoral lesion as well as other lesions   (i) left hip films not suggestive  of impending pathologic fracture  (d) non-contrast head  CT 11/25/2020 negative except for sinusitis  (e) CA 27-29 is informative (was 104.1 on 10/29/2020)  (10) foundation 1 study requested on 11/03/2020 liver biopsy dated 11/03/2020 shows PI K3 CA amplification, and a T p53 mutation.  There were no mutations in BRCA1 and BRCA2 or HER-2.  There is amplification of PRK CI, S0X2, T ERC, and FGF 1 2.  The microsatellite status is stable.  The tumor mutational burden is 4 mutations per Mb  (11) started paclitaxel 11/19/2020, repeated day 1 day 8 of every 21-day cycle, stopped after 04/23/2021 dose with continuing response  (a) pembrolizumab added 12/17/2020, repeated every 21 days, ongoing  (b) MRI liver 01/21/2021 (after 3 cycles) shows significant response  (c) repeat liver MRI 04/30/2021 shows essentially stable disease  (D) repeat liver MRI on 06/30/2021 shows progression  (12) zoledronate added 12/10/2020, repeated every 12 weeks   (13) Trodelvy given on day 1 of 8 of a 21 day cycle beginning 07/15/2021  PLAN:  Kamie is here today for follow up after starting [place].  She is tolerating this moderately well.  Her CBC is stable and her CMET is pending at the time of appt completion.  She will proceed with therapy so long as labs within acceptable parameters.    Sumie and I talked about her activity level.  I recommended she remain active, and do a small walk if possible on the days she has increased fatigue as it may help.  For her constipation I suggested she increase to taking 2 Senokot per day and add 1/2 or 1 dose of miralax daily.  This will help.    We discussed her TSH and Levothyroxine prescription.  We will review her TSH level at her next appt in 2 weeks to determine if she needs to continue the levothyroxine.    Her pain has resolved since her ER appointment and she has not required any further percocet.  I asked that she keep track of if her pain returns and how often she needs the percocet so that we can ensure we are treating her pain  effectively.  We will revisit this at her next visit.    Davonne will return in 2 weeks for labs, f/u, and her next [place].  She knows to call for any questions that may arise between now and her next appointment.  We are happy to see her sooner if needed.  Total encounter time 33 minutes.Wilber Bihari, NP 07/22/21 9:37 AM Medical Oncology and Hematology [facility] [place], [place] [id number] Tel. [phone]    Fax. [phone]   *Total Encounter Time as defined by the Centers for Medicare and Medicaid Services includes, in addition to the face-to-face time of a patient visit (documented in the note above) non-face-to-face time: obtaining and reviewing outside history, ordering and reviewing medications, tests or procedures, care coordination (communications with other health care professionals or caregivers) and documentation in the medical record.

## 2021-07-24 ENCOUNTER — Telehealth: Payer: Self-pay | Admitting: *Deleted

## 2021-07-24 NOTE — Telephone Encounter (Signed)
Received call from pt stating that she had a constipated stool yest & looser BM today with blood on tissue.  She was concerned since she is on blood thinner.  Informed to cont to watch but to drink plenty of fluids, add fiber to diet, take stool softener, & OK to use external hemorrhoid cream if needed.  She expressed understanding.

## 2021-07-29 ENCOUNTER — Emergency Department (HOSPITAL_COMMUNITY): Payer: 59

## 2021-07-29 ENCOUNTER — Inpatient Hospital Stay (HOSPITAL_COMMUNITY)
Admission: EM | Admit: 2021-07-29 | Discharge: 2021-08-11 | DRG: 871 | Disposition: E | Payer: 59 | Attending: Emergency Medicine | Admitting: Emergency Medicine

## 2021-07-29 ENCOUNTER — Encounter (HOSPITAL_COMMUNITY): Payer: Self-pay | Admitting: Emergency Medicine

## 2021-07-29 ENCOUNTER — Other Ambulatory Visit: Payer: Self-pay | Admitting: Oncology

## 2021-07-29 ENCOUNTER — Other Ambulatory Visit: Payer: Self-pay

## 2021-07-29 DIAGNOSIS — M5442 Lumbago with sciatica, left side: Secondary | ICD-10-CM | POA: Diagnosis present

## 2021-07-29 DIAGNOSIS — Z86711 Personal history of pulmonary embolism: Secondary | ICD-10-CM

## 2021-07-29 DIAGNOSIS — T451X5A Adverse effect of antineoplastic and immunosuppressive drugs, initial encounter: Secondary | ICD-10-CM | POA: Diagnosis present

## 2021-07-29 DIAGNOSIS — N170 Acute kidney failure with tubular necrosis: Secondary | ICD-10-CM | POA: Diagnosis present

## 2021-07-29 DIAGNOSIS — Z515 Encounter for palliative care: Secondary | ICD-10-CM

## 2021-07-29 DIAGNOSIS — E1165 Type 2 diabetes mellitus with hyperglycemia: Secondary | ICD-10-CM | POA: Diagnosis present

## 2021-07-29 DIAGNOSIS — G9340 Encephalopathy, unspecified: Secondary | ICD-10-CM | POA: Diagnosis not present

## 2021-07-29 DIAGNOSIS — Z8261 Family history of arthritis: Secondary | ICD-10-CM

## 2021-07-29 DIAGNOSIS — K5530 Necrotizing enterocolitis, unspecified: Secondary | ICD-10-CM | POA: Diagnosis present

## 2021-07-29 DIAGNOSIS — E039 Hypothyroidism, unspecified: Secondary | ICD-10-CM | POA: Diagnosis present

## 2021-07-29 DIAGNOSIS — Z806 Family history of leukemia: Secondary | ICD-10-CM

## 2021-07-29 DIAGNOSIS — A419 Sepsis, unspecified organism: Secondary | ICD-10-CM | POA: Diagnosis present

## 2021-07-29 DIAGNOSIS — K559 Vascular disorder of intestine, unspecified: Secondary | ICD-10-CM | POA: Diagnosis present

## 2021-07-29 DIAGNOSIS — D6959 Other secondary thrombocytopenia: Secondary | ICD-10-CM | POA: Diagnosis present

## 2021-07-29 DIAGNOSIS — I10 Essential (primary) hypertension: Secondary | ICD-10-CM | POA: Diagnosis present

## 2021-07-29 DIAGNOSIS — Z79890 Hormone replacement therapy (postmenopausal): Secondary | ICD-10-CM

## 2021-07-29 DIAGNOSIS — C7951 Secondary malignant neoplasm of bone: Secondary | ICD-10-CM | POA: Diagnosis present

## 2021-07-29 DIAGNOSIS — Z800 Family history of malignant neoplasm of digestive organs: Secondary | ICD-10-CM

## 2021-07-29 DIAGNOSIS — R6521 Severe sepsis with septic shock: Secondary | ICD-10-CM | POA: Diagnosis not present

## 2021-07-29 DIAGNOSIS — K5289 Other specified noninfective gastroenteritis and colitis: Secondary | ICD-10-CM | POA: Diagnosis not present

## 2021-07-29 DIAGNOSIS — Z6841 Body Mass Index (BMI) 40.0 and over, adult: Secondary | ICD-10-CM

## 2021-07-29 DIAGNOSIS — E669 Obesity, unspecified: Secondary | ICD-10-CM | POA: Diagnosis present

## 2021-07-29 DIAGNOSIS — Z170 Estrogen receptor positive status [ER+]: Secondary | ICD-10-CM

## 2021-07-29 DIAGNOSIS — Z66 Do not resuscitate: Secondary | ICD-10-CM | POA: Diagnosis present

## 2021-07-29 DIAGNOSIS — D709 Neutropenia, unspecified: Secondary | ICD-10-CM | POA: Diagnosis present

## 2021-07-29 DIAGNOSIS — C787 Secondary malignant neoplasm of liver and intrahepatic bile duct: Secondary | ICD-10-CM | POA: Diagnosis present

## 2021-07-29 DIAGNOSIS — K37 Unspecified appendicitis: Secondary | ICD-10-CM | POA: Diagnosis present

## 2021-07-29 DIAGNOSIS — Z923 Personal history of irradiation: Secondary | ICD-10-CM

## 2021-07-29 DIAGNOSIS — G8929 Other chronic pain: Secondary | ICD-10-CM | POA: Diagnosis present

## 2021-07-29 DIAGNOSIS — C50411 Malignant neoplasm of upper-outer quadrant of right female breast: Secondary | ICD-10-CM | POA: Diagnosis present

## 2021-07-29 DIAGNOSIS — Z825 Family history of asthma and other chronic lower respiratory diseases: Secondary | ICD-10-CM

## 2021-07-29 DIAGNOSIS — N179 Acute kidney failure, unspecified: Secondary | ICD-10-CM | POA: Diagnosis present

## 2021-07-29 DIAGNOSIS — Z86718 Personal history of other venous thrombosis and embolism: Secondary | ICD-10-CM

## 2021-07-29 DIAGNOSIS — Z20822 Contact with and (suspected) exposure to covid-19: Secondary | ICD-10-CM | POA: Diagnosis present

## 2021-07-29 DIAGNOSIS — Z7901 Long term (current) use of anticoagulants: Secondary | ICD-10-CM

## 2021-07-29 DIAGNOSIS — Z79899 Other long term (current) drug therapy: Secondary | ICD-10-CM

## 2021-07-29 DIAGNOSIS — Z83438 Family history of other disorder of lipoprotein metabolism and other lipidemia: Secondary | ICD-10-CM

## 2021-07-29 DIAGNOSIS — I2694 Multiple subsegmental pulmonary emboli without acute cor pulmonale: Secondary | ICD-10-CM | POA: Diagnosis present

## 2021-07-29 DIAGNOSIS — Z8249 Family history of ischemic heart disease and other diseases of the circulatory system: Secondary | ICD-10-CM

## 2021-07-29 DIAGNOSIS — E11649 Type 2 diabetes mellitus with hypoglycemia without coma: Secondary | ICD-10-CM | POA: Diagnosis not present

## 2021-07-29 DIAGNOSIS — F419 Anxiety disorder, unspecified: Secondary | ICD-10-CM | POA: Diagnosis not present

## 2021-07-29 DIAGNOSIS — R571 Hypovolemic shock: Secondary | ICD-10-CM | POA: Diagnosis present

## 2021-07-29 DIAGNOSIS — E162 Hypoglycemia, unspecified: Secondary | ICD-10-CM | POA: Diagnosis present

## 2021-07-29 LAB — BASIC METABOLIC PANEL
Anion gap: 11 (ref 5–15)
Anion gap: 14 (ref 5–15)
Anion gap: 9 (ref 5–15)
BUN: 17 mg/dL (ref 6–20)
BUN: 18 mg/dL (ref 6–20)
BUN: 20 mg/dL (ref 6–20)
CO2: 17 mmol/L — ABNORMAL LOW (ref 22–32)
CO2: 15 mmol/L — ABNORMAL LOW (ref 22–32)
CO2: 19 mmol/L — ABNORMAL LOW (ref 22–32)
Calcium: 7.7 mg/dL — ABNORMAL LOW (ref 8.9–10.3)
Calcium: 7.9 mg/dL — ABNORMAL LOW (ref 8.9–10.3)
Calcium: 7.9 mg/dL — ABNORMAL LOW (ref 8.9–10.3)
Chloride: 100 mmol/L (ref 98–111)
Chloride: 104 mmol/L (ref 98–111)
Chloride: 104 mmol/L (ref 98–111)
Creatinine, Ser: 1.53 mg/dL — ABNORMAL HIGH (ref 0.44–1.00)
Creatinine, Ser: 1.36 mg/dL — ABNORMAL HIGH (ref 0.44–1.00)
Creatinine, Ser: 1.45 mg/dL — ABNORMAL HIGH (ref 0.44–1.00)
GFR, Estimated: 42 mL/min — ABNORMAL LOW (ref >60)
GFR, Estimated: 48 mL/min — ABNORMAL LOW (ref >60)
GFR, Estimated: 44 mL/min — ABNORMAL LOW (ref >60)
Glucose, Bld: 566 mg/dL (ref 70–99)
Glucose, Bld: 242 mg/dL — ABNORMAL HIGH (ref 70–99)
Glucose, Bld: 175 mg/dL — ABNORMAL HIGH (ref 70–99)
Potassium: 4 mmol/L (ref 3.5–5.1)
Potassium: 3.7 mmol/L (ref 3.5–5.1)
Potassium: 4 mmol/L (ref 3.5–5.1)
Sodium: 128 mmol/L — ABNORMAL LOW (ref 135–145)
Sodium: 133 mmol/L — ABNORMAL LOW (ref 135–145)
Sodium: 132 mmol/L — ABNORMAL LOW (ref 135–145)

## 2021-07-29 LAB — COMPREHENSIVE METABOLIC PANEL
ALT: 117 U/L — ABNORMAL HIGH (ref 0–44)
AST: 64 U/L — ABNORMAL HIGH (ref 15–41)
Albumin: 2.9 g/dL — ABNORMAL LOW (ref 3.5–5.0)
Alkaline Phosphatase: 899 U/L — ABNORMAL HIGH (ref 38–126)
Anion gap: 14 (ref 5–15)
BUN: 16 mg/dL (ref 6–20)
CO2: 18 mmol/L — ABNORMAL LOW (ref 22–32)
Calcium: 8.7 mg/dL — ABNORMAL LOW (ref 8.9–10.3)
Chloride: 97 mmol/L — ABNORMAL LOW (ref 98–111)
Creatinine, Ser: 1.38 mg/dL — ABNORMAL HIGH (ref 0.44–1.00)
GFR, Estimated: 47 mL/min — ABNORMAL LOW (ref >60)
Glucose, Bld: 509 mg/dL (ref 70–99)
Potassium: 3.9 mmol/L (ref 3.5–5.1)
Sodium: 129 mmol/L — ABNORMAL LOW (ref 135–145)
Total Bilirubin: 5.4 mg/dL — ABNORMAL HIGH (ref 0.3–1.2)
Total Protein: 6.2 g/dL — ABNORMAL LOW (ref 6.5–8.1)

## 2021-07-29 LAB — CBC WITH DIFFERENTIAL/PLATELET
Abs Immature Granulocytes: 0.01 10*3/uL (ref 0.00–0.07)
Basophils Absolute: 0 10*3/uL (ref 0.0–0.1)
Basophils Relative: 1 %
Eosinophils Absolute: 0 10*3/uL (ref 0.0–0.5)
Eosinophils Relative: 0 %
HCT: 44.1 % (ref 36.0–46.0)
Hemoglobin: 14.7 g/dL (ref 12.0–15.0)
Immature Granulocytes: 1 %
Lymphocytes Relative: 86 %
Lymphs Abs: 1.2 10*3/uL (ref 0.7–4.0)
MCH: 27.2 pg (ref 26.0–34.0)
MCHC: 33.3 g/dL (ref 30.0–36.0)
MCV: 81.7 fL (ref 80.0–100.0)
Monocytes Absolute: 0 10*3/uL — ABNORMAL LOW (ref 0.1–1.0)
Monocytes Relative: 2 %
Neutro Abs: 0.1 10*3/uL — CL (ref 1.7–7.7)
Neutrophils Relative %: 10 %
Platelets: 141 10*3/uL — ABNORMAL LOW (ref 150–400)
RBC: 5.4 MIL/uL — ABNORMAL HIGH (ref 3.87–5.11)
RDW: 13.1 % (ref 11.5–15.5)
WBC: 1.6 10*3/uL — ABNORMAL LOW (ref 4.0–10.5)
nRBC: 0 % (ref 0.0–0.2)

## 2021-07-29 LAB — LACTIC ACID, PLASMA
Lactic Acid, Venous: 5.6 mmol/L (ref 0.5–1.9)
Lactic Acid, Venous: 4.3 mmol/L (ref 0.5–1.9)
Lactic Acid, Venous: 4.5 mmol/L (ref 0.5–1.9)
Lactic Acid, Venous: 3.7 mmol/L (ref 0.5–1.9)

## 2021-07-29 LAB — CBG MONITORING, ED
Glucose-Capillary: 462 mg/dL — ABNORMAL HIGH (ref 70–99)
Glucose-Capillary: 530 mg/dL (ref 70–99)
Glucose-Capillary: 465 mg/dL — ABNORMAL HIGH (ref 70–99)
Glucose-Capillary: 306 mg/dL — ABNORMAL HIGH (ref 70–99)
Glucose-Capillary: 443 mg/dL — ABNORMAL HIGH (ref 70–99)
Glucose-Capillary: 244 mg/dL — ABNORMAL HIGH (ref 70–99)

## 2021-07-29 LAB — BLOOD GAS, VENOUS
Acid-base deficit: 8.7 mmol/L — ABNORMAL HIGH (ref 0.0–2.0)
Bicarbonate: 15.4 mmol/L — ABNORMAL LOW (ref 20.0–28.0)
FIO2: 21
O2 Saturation: 58 %
Patient temperature: 98.6
pCO2, Ven: 29.2 mmHg — ABNORMAL LOW (ref 44.0–60.0)
pH, Ven: 7.342 (ref 7.250–7.430)
pO2, Ven: 34.8 mmHg (ref 32.0–45.0)

## 2021-07-29 LAB — GLUCOSE, CAPILLARY
Glucose-Capillary: 148 mg/dL — ABNORMAL HIGH (ref 70–99)
Glucose-Capillary: 149 mg/dL — ABNORMAL HIGH (ref 70–99)
Glucose-Capillary: 137 mg/dL — ABNORMAL HIGH (ref 70–99)
Glucose-Capillary: 164 mg/dL — ABNORMAL HIGH (ref 70–99)
Glucose-Capillary: 141 mg/dL — ABNORMAL HIGH (ref 70–99)

## 2021-07-29 LAB — BETA-HYDROXYBUTYRIC ACID
Beta-Hydroxybutyric Acid: 0.49 mmol/L — ABNORMAL HIGH (ref 0.05–0.27)
Beta-Hydroxybutyric Acid: 0.13 mmol/L (ref 0.05–0.27)

## 2021-07-29 LAB — PROTIME-INR
INR: 1.8 — ABNORMAL HIGH (ref 0.8–1.2)
Prothrombin Time: 21 seconds — ABNORMAL HIGH (ref 11.4–15.2)

## 2021-07-29 LAB — MRSA NEXT GEN BY PCR, NASAL: MRSA by PCR Next Gen: NOT DETECTED

## 2021-07-29 LAB — HEMOGLOBIN A1C
Hgb A1c MFr Bld: 9.7 % — ABNORMAL HIGH (ref 4.8–5.6)
Mean Plasma Glucose: 231.69 mg/dL

## 2021-07-29 LAB — APTT: aPTT: 32 seconds (ref 24–36)

## 2021-07-29 LAB — HCG, QUANTITATIVE, PREGNANCY: hCG, Beta Chain, Quant, S: 8 m[IU]/mL — ABNORMAL HIGH (ref <5)

## 2021-07-29 LAB — I-STAT BETA HCG BLOOD, ED (MC, WL, AP ONLY): I-stat hCG, quantitative: 8.8 m[IU]/mL — ABNORMAL HIGH (ref <5)

## 2021-07-29 LAB — RESP PANEL BY RT-PCR (FLU A&B, COVID) ARPGX2
Influenza A by PCR: NEGATIVE
Influenza B by PCR: NEGATIVE
SARS Coronavirus 2 by RT PCR: NEGATIVE

## 2021-07-29 IMAGING — DX DG CHEST 1V PORT
1 series · 1 of 1 positions shown · non-contrast
Comparison: [DATE].

CLINICAL DATA: Sepsis.

EXAM:
PORTABLE CHEST 1 VIEW

[chest ap]
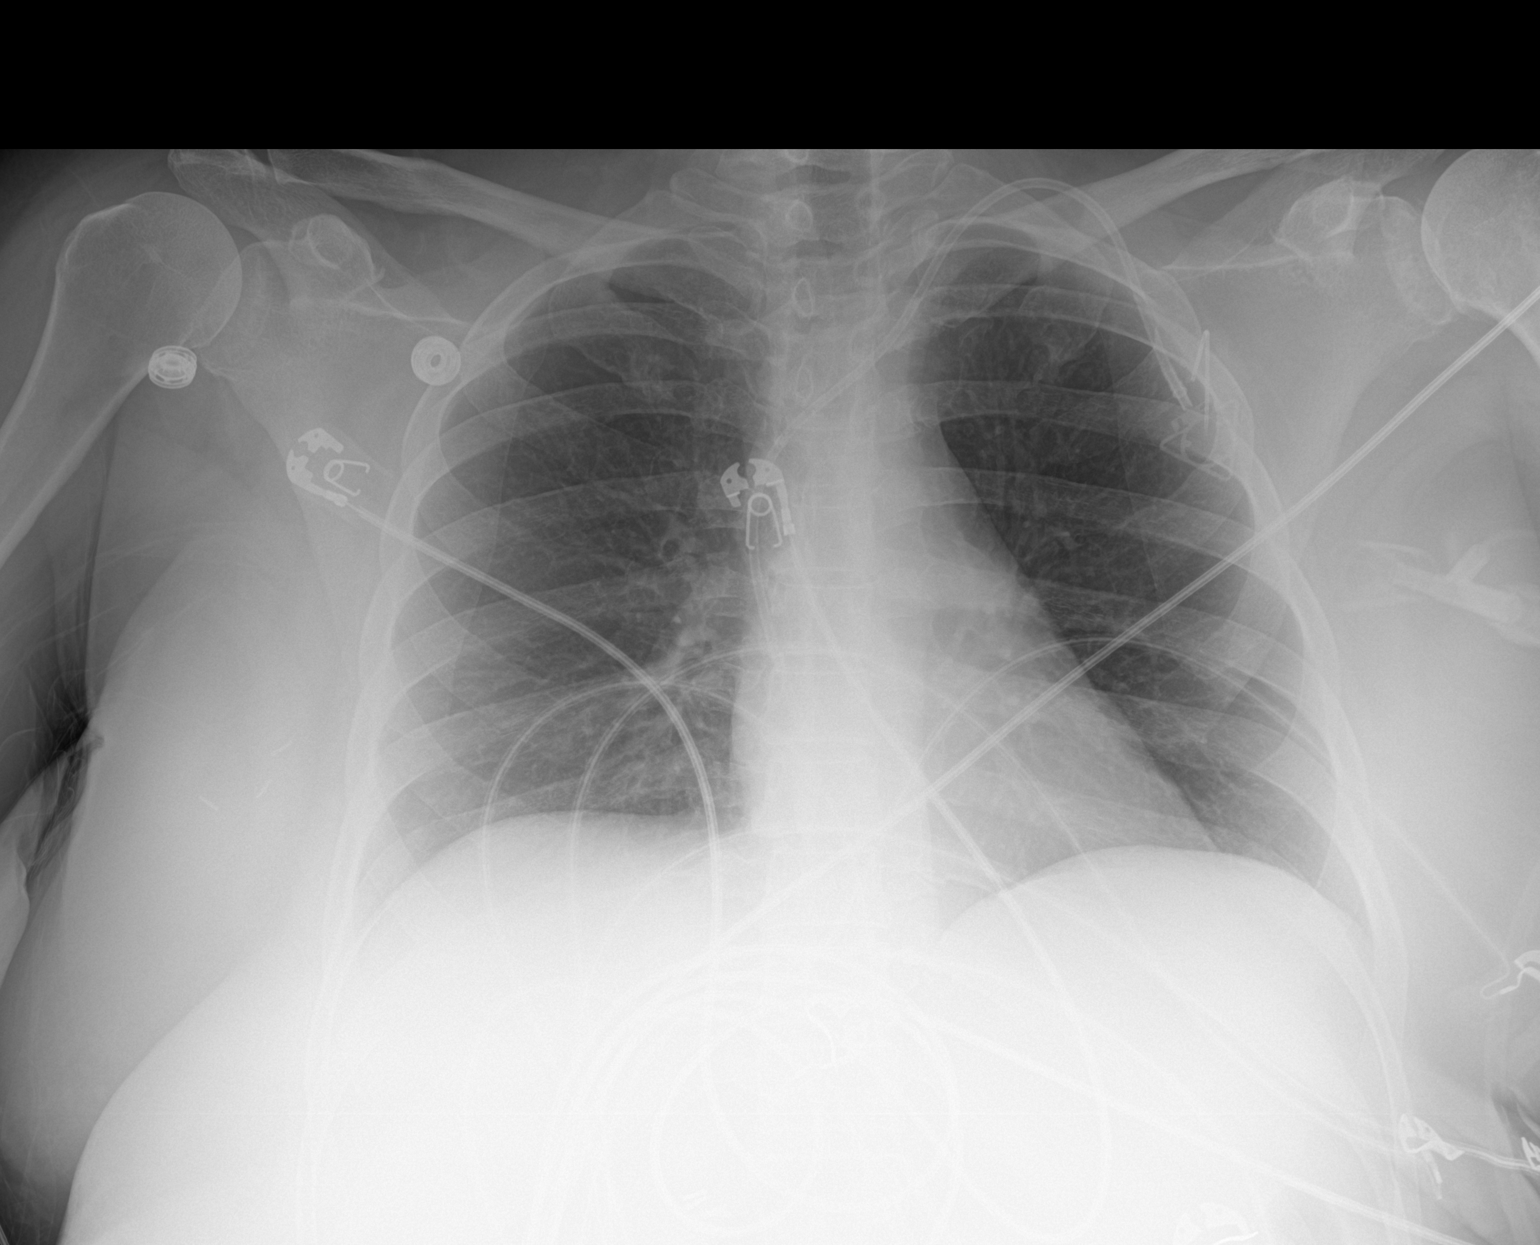

[1 of 1 positions shown; findings below may reference images not displayed]

FINDINGS: The heart size and mediastinal contours are within normal limits.
Both lungs are clear. Interval placement of left internal jugular
Port-A-Cath with distal tip in expected position of cavoatrial
junction. The visualized skeletal structures are unremarkable.
IMPRESSION: No active disease.

## 2021-07-29 IMAGING — CT CT ABD-PELV W/ CM
2 of 5 series · 16 of 46 positions shown, 18 images · IV contrast (omnipaque)
Comparison: CT abdomen and pelvis dated [DATE]

CLINICAL DATA: History of metastatic breast cancer

EXAM:
CT ABDOMEN AND PELVIS WITH CONTRAST
TECHNIQUE: Multidetector CT imaging of the abdomen and pelvis was performed
using the standard protocol following bolus administration of
intravenous contrast.
CONTRAST:  60mL OMNIPAQUE IOHEXOL 350 MG/ML SOLN

[Series 2: axial st · axial · 0.76mm/px · z∈[+897,+1317]mm · 13 of 99 slices shown, 15 images]
[im 8/99  soft-tissue]
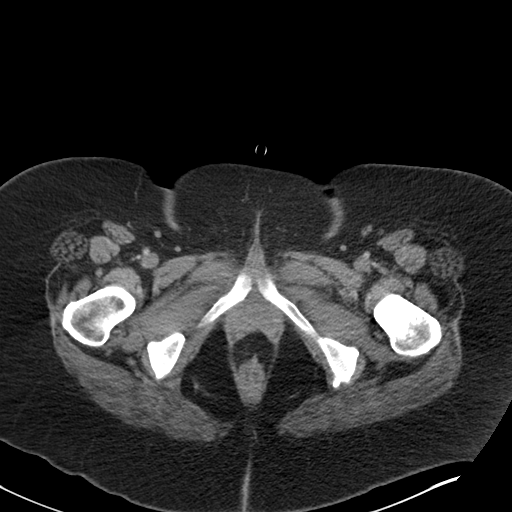
[im 8/99  bone]
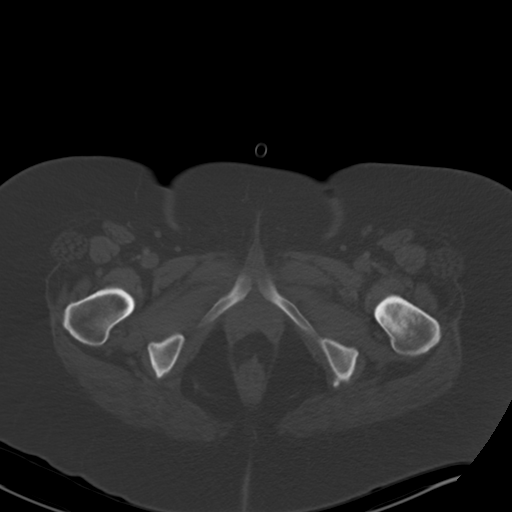
[im 15/99  soft-tissue]
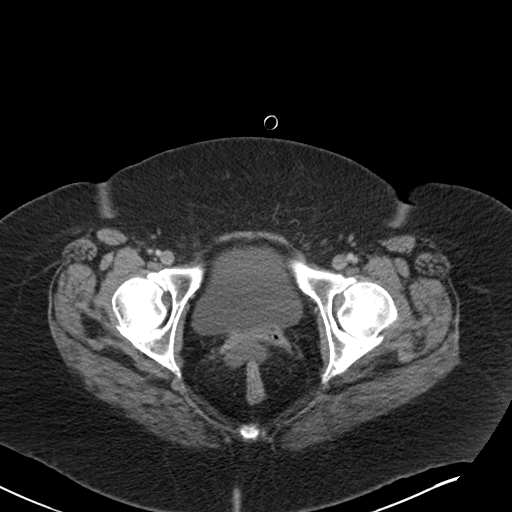
[im 22/99  soft-tissue]
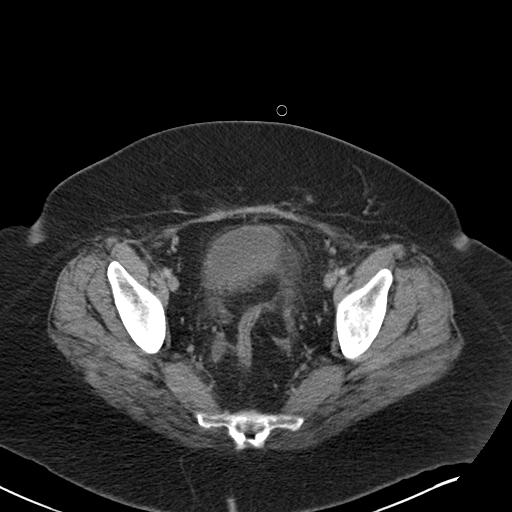
[im 29/99  soft-tissue]
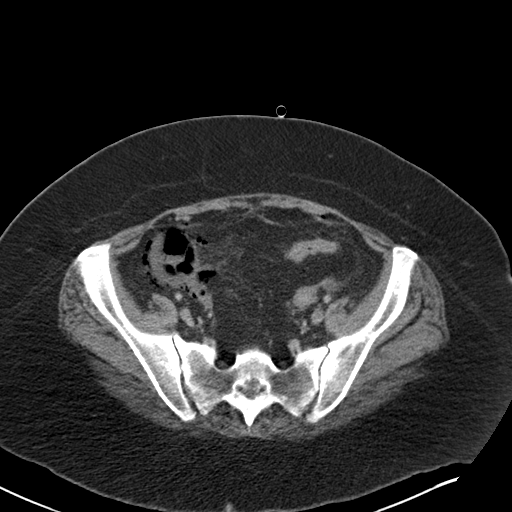
[im 36/99  soft-tissue]
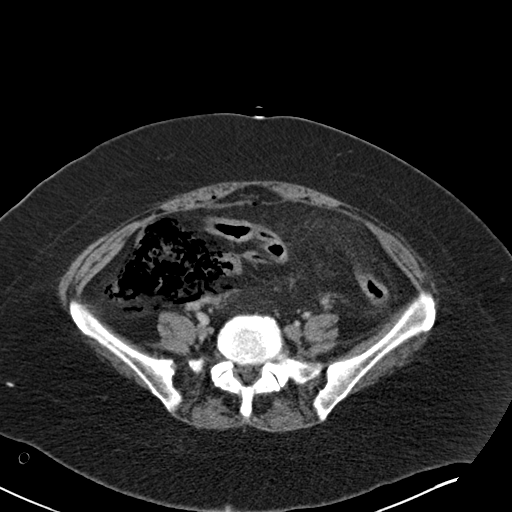
[im 43/99  soft-tissue]
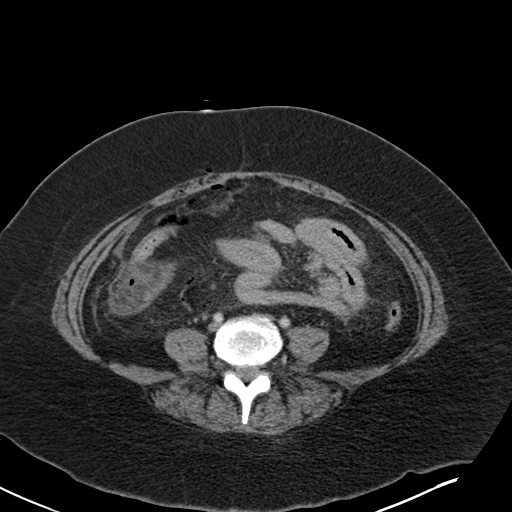
[im 50/99  soft-tissue]
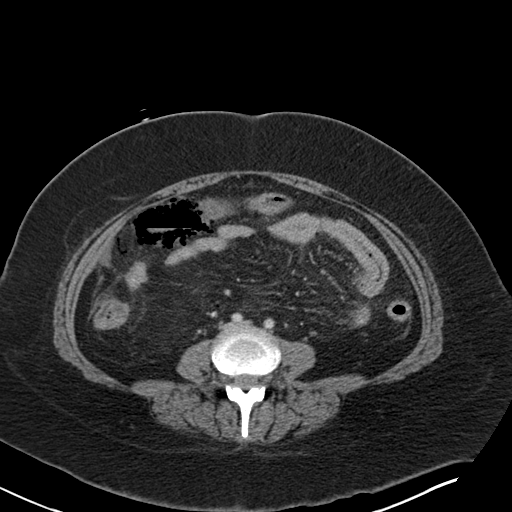
[im 57/99  soft-tissue]
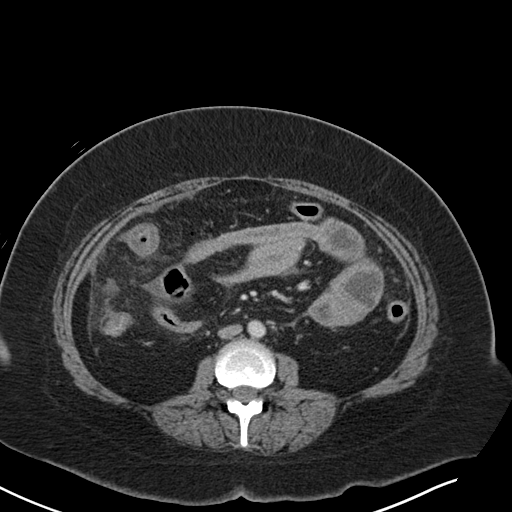
[im 64/99  soft-tissue]
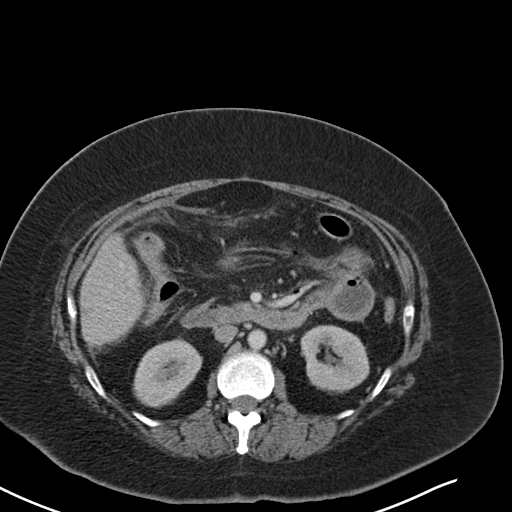
[im 64/99  bone]
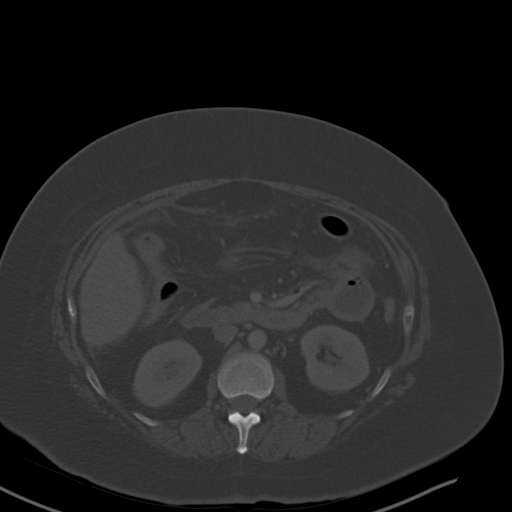
[im 71/99  soft-tissue]
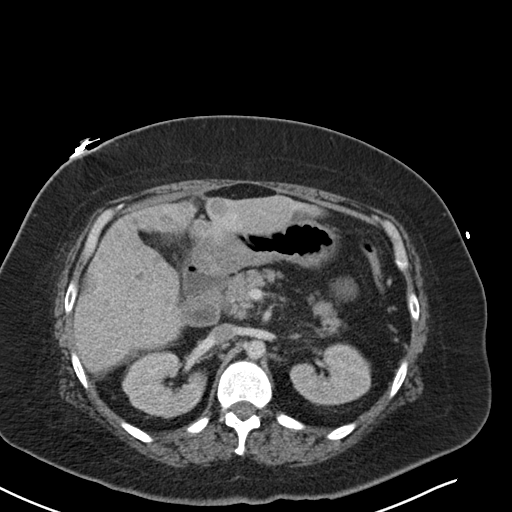
[im 78/99  soft-tissue]
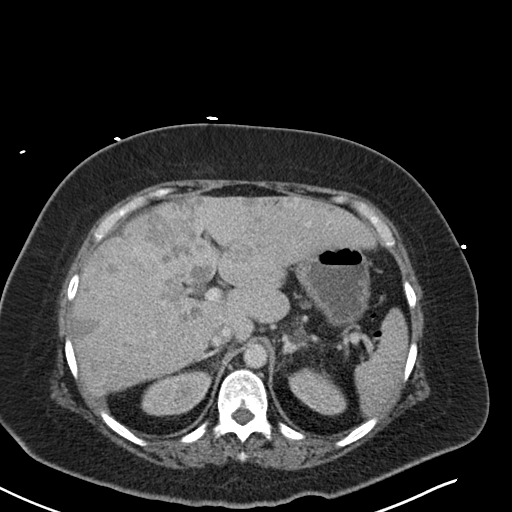
[im 85/99  soft-tissue]
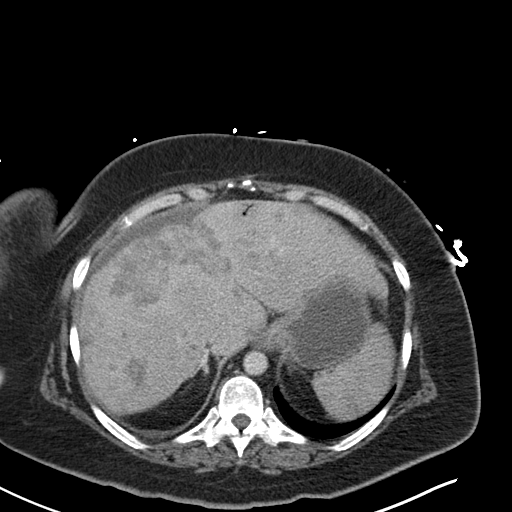
[im 92/99  soft-tissue]
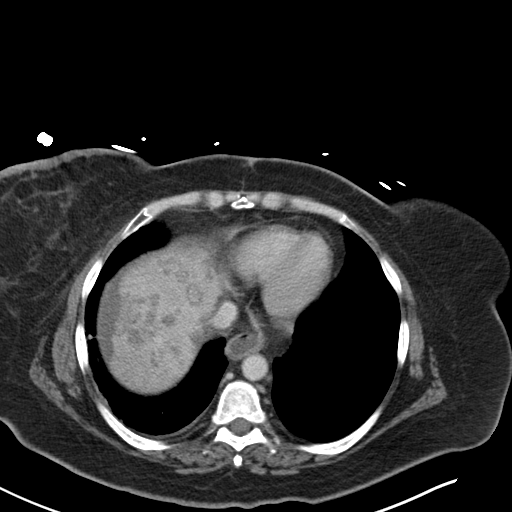

[Series 5: coronal st · coronal · 0.94mm/px · 3 of 151 slices shown]
[im 51/151  soft-tissue]
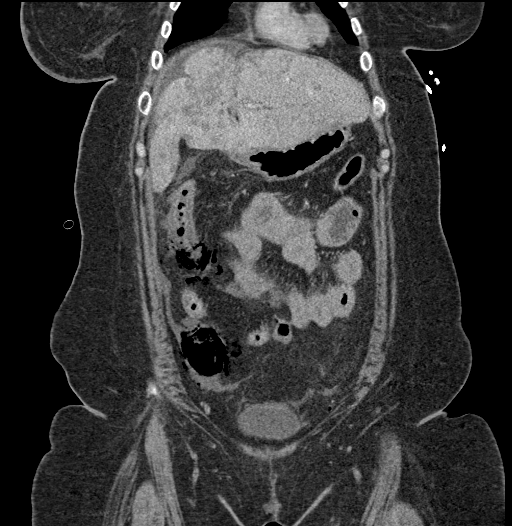
[im 67/151  soft-tissue]
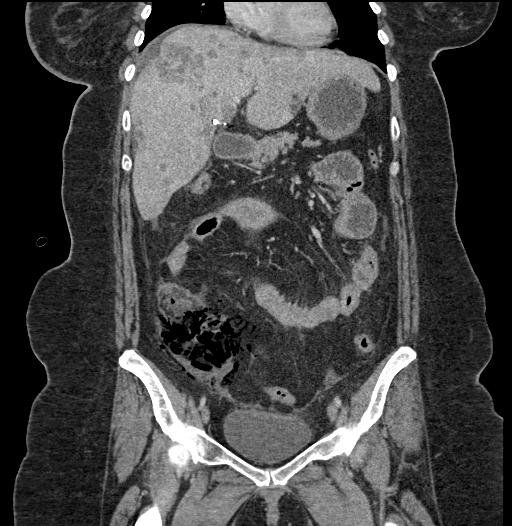
[im 84/151  soft-tissue]
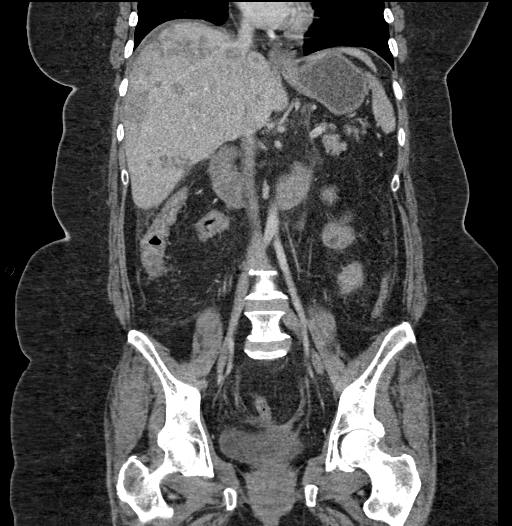

[16 of 46 positions shown; findings below may reference images not displayed]

FINDINGS: Lower chest: Small hiatal hernia.  No acute abnormality.

Hepatobiliary: Numerous ill-defined low-attenuation liver lesions
are unchanged when compared with recent prior exam. New portal
venous gas. Trace perihepatic fluid, new compared to prior exam.
Cholecystectomy clips. No biliary ductal dilation.

Pancreas: Unremarkable. No pancreatic ductal dilatation or
surrounding inflammatory changes.

Spleen: Normal in size without focal abnormality.

Adrenals/Urinary Tract: Adrenal glands are unremarkable. Kidneys are
normal, without renal calculi, focal lesion, or hydronephrosis.
Bladder is unremarkable.

Stomach/Bowel: Wall thickening of the right colon with extensive
pneumatosis seen at the cecum and proximal transverse colon. Mildly
dilated proximal small loops of small bowel, likely reactive.
Appendix is not visualized.

Vascular/Lymphatic: No significant vascular findings are present. No
enlarged abdominal or pelvic lymph nodes.

Reproductive: Status post hysterectomy. No adnexal masses.

Other: Skin thickening of the right breast, unchanged compared to
prior. Trace free fluid seen in the pelvis and about the liver.
Scattered locules of free intraperitoneal air.

Musculoskeletal: Unchanged scattered sclerotic lesions.
IMPRESSION: New wall thickening of the right colon with extensive pneumatosis
seen at the cecum and proximal transverse colon. There is associated
portal venous gas and small locules of free intraperitoneal air.
Findings are highly concerning for bowel ischemia.

Dilated loops of proximal small bowel with gradual transition point
near the area of transverse colon inflammation, findings are likely
reactive.

Unchanged liver and osseous metastatic disease.

Critical Value/emergent results were called by telephone at the time
of interpretation on [DATE] at [DATE] to provider KELLUM ,
who verbally acknowledged these results.

## 2021-07-29 MED ORDER — MORPHINE SULFATE (PF) 4 MG/ML IV SOLN
4.0000 mg | INTRAVENOUS | Status: DC | PRN
  Administered 2021-07-29 (×3): 4 mg via INTRAVENOUS
  Filled 2021-07-29 (×3): qty 1

## 2021-07-29 MED ORDER — MORPHINE 100MG IN NS 100ML (1MG/ML) PREMIX INFUSION
1.0000 mg/h | INTRAVENOUS | Status: DC
  Administered 2021-07-29: 1 mg/h via INTRAVENOUS
  Filled 2021-07-29: qty 100

## 2021-07-29 MED ORDER — HEPARIN SODIUM (PORCINE) 5000 UNIT/ML IJ SOLN: 5000.0000 [IU] | Freq: Three times a day (TID) | INTRAMUSCULAR | Status: DC

## 2021-07-29 MED ORDER — FENTANYL CITRATE PF 50 MCG/ML IJ SOSY
50.0000 ug | PREFILLED_SYRINGE | Freq: Once | INTRAMUSCULAR | Status: AC
Start: 2021-07-29 — End: 2021-07-29
  Administered 2021-07-29: 50 ug via INTRAVENOUS
  Filled 2021-07-29: qty 1

## 2021-07-29 MED ORDER — TBO-FILGRASTIM 480 MCG/0.8ML ~~LOC~~ SOSY
480.0000 ug | PREFILLED_SYRINGE | Freq: Every day | SUBCUTANEOUS | Status: DC
  Administered 2021-07-29: 480 ug via SUBCUTANEOUS
  Filled 2021-07-29: qty 0.8

## 2021-07-29 MED ORDER — FENTANYL CITRATE PF 50 MCG/ML IJ SOSY
50.0000 ug | PREFILLED_SYRINGE | Freq: Once | INTRAMUSCULAR | Status: AC
  Administered 2021-07-29: 50 ug via INTRAVENOUS
  Filled 2021-07-29: qty 1

## 2021-07-29 MED ORDER — METRONIDAZOLE 500 MG/100ML IV SOLN
500.0000 mg | Freq: Once | INTRAVENOUS | Status: AC
  Administered 2021-07-29: 500 mg via INTRAVENOUS
  Filled 2021-07-29: qty 100

## 2021-07-29 MED ORDER — ONDANSETRON HCL 4 MG/2ML IJ SOLN
4.0000 mg | Freq: Once | INTRAMUSCULAR | Status: AC
  Administered 2021-07-29: 4 mg via INTRAVENOUS
  Filled 2021-07-29: qty 2

## 2021-07-29 MED ORDER — CHLORHEXIDINE GLUCONATE CLOTH 2 % EX PADS
6.0000 | MEDICATED_PAD | Freq: Every day | CUTANEOUS | Status: DC
  Administered 2021-07-29: 6 via TOPICAL

## 2021-07-29 MED ORDER — LACTATED RINGERS IV BOLUS (SEPSIS)
500.0000 mL | Freq: Once | INTRAVENOUS | Status: AC
  Administered 2021-07-29: 500 mL via INTRAVENOUS

## 2021-07-29 MED ORDER — DEXTROSE IN LACTATED RINGERS 5 % IV SOLN: INTRAVENOUS | Status: DC

## 2021-07-29 MED ORDER — NOREPINEPHRINE 4 MG/250ML-% IV SOLN
0.0000 ug/min | INTRAVENOUS | Status: DC
  Administered 2021-07-29 (×2): 33 ug/min via INTRAVENOUS
  Administered 2021-07-29: 33.067 ug/min via INTRAVENOUS
  Administered 2021-07-29: 2 ug/min via INTRAVENOUS
  Administered 2021-07-30: 27 ug/min via INTRAVENOUS
  Administered 2021-07-30: 33 ug/min via INTRAVENOUS
  Administered 2021-07-30: 26 ug/min via INTRAVENOUS
  Administered 2021-07-30: 38 ug/min via INTRAVENOUS
  Administered 2021-07-30: 40 ug/min via INTRAVENOUS
  Administered 2021-07-30: 33.067 ug/min via INTRAVENOUS
  Administered 2021-07-30: 33 ug/min via INTRAVENOUS
  Filled 2021-07-29 (×12): qty 250

## 2021-07-29 MED ORDER — LACTATED RINGERS IV SOLN: INTRAVENOUS | Status: DC

## 2021-07-29 MED ORDER — MORPHINE SULFATE (PF) 4 MG/ML IV SOLN
4.0000 mg | Freq: Once | INTRAVENOUS | Status: AC
  Administered 2021-07-29: 4 mg via INTRAVENOUS
  Filled 2021-07-29: qty 1

## 2021-07-29 MED ORDER — INSULIN REGULAR(HUMAN) IN NACL 100-0.9 UT/100ML-% IV SOLN
INTRAVENOUS | Status: DC
  Administered 2021-07-29 (×2): 13 [IU]/h via INTRAVENOUS
  Filled 2021-07-29: qty 100

## 2021-07-29 MED ORDER — INSULIN ASPART 100 UNIT/ML IJ SOLN
0.0000 [IU] | INTRAMUSCULAR | Status: DC
  Administered 2021-07-29: 2 [IU] via SUBCUTANEOUS

## 2021-07-29 MED ORDER — LACTATED RINGERS IV BOLUS
1000.0000 mL | Freq: Once | INTRAVENOUS | Status: AC
  Administered 2021-07-29: 1000 mL via INTRAVENOUS

## 2021-07-29 MED ORDER — LACTATED RINGERS IV BOLUS (SEPSIS)
1000.0000 mL | Freq: Once | INTRAVENOUS | Status: AC
  Administered 2021-07-29: 1000 mL via INTRAVENOUS

## 2021-07-29 MED ORDER — DEXTROSE 50 % IV SOLN: 0.0000 mL | INTRAVENOUS | Status: DC | PRN

## 2021-07-29 MED ORDER — POTASSIUM CHLORIDE 10 MEQ/100ML IV SOLN
10.0000 meq | INTRAVENOUS | Status: AC
Start: 2021-07-29 — End: 2021-07-29
  Administered 2021-07-29 (×2): 10 meq via INTRAVENOUS
  Filled 2021-07-29 (×2): qty 100

## 2021-07-29 MED ORDER — PANTOPRAZOLE SODIUM 40 MG IV SOLR
40.0000 mg | Freq: Every day | INTRAVENOUS | Status: DC
  Administered 2021-07-29: 40 mg via INTRAVENOUS
  Filled 2021-07-29: qty 40

## 2021-07-29 MED ORDER — ALBUMIN HUMAN 25 % IV SOLN
25.0000 g | Freq: Four times a day (QID) | INTRAVENOUS | Status: DC
Start: 2021-07-29 — End: 2021-07-29
  Administered 2021-07-29: 25 g via INTRAVENOUS
  Filled 2021-07-29: qty 100

## 2021-07-29 MED ORDER — SODIUM CHLORIDE 0.9 % IV SOLN
2.0000 g | Freq: Once | INTRAVENOUS | Status: AC
  Administered 2021-07-29: 2 g via INTRAVENOUS
  Filled 2021-07-29: qty 2

## 2021-07-29 MED ORDER — IOHEXOL 350 MG/ML SOLN
60.0000 mL | Freq: Once | INTRAVENOUS | Status: AC | PRN
  Administered 2021-07-29: 60 mL via INTRAVENOUS

## 2021-07-29 MED ORDER — LACTATED RINGERS IV BOLUS: 1000.0000 mL | Freq: Once | INTRAVENOUS | Status: AC

## 2021-07-29 MED ORDER — PIPERACILLIN-TAZOBACTAM 3.375 G IVPB
3.3750 g | Freq: Three times a day (TID) | INTRAVENOUS | Status: DC
  Administered 2021-07-29: 3.375 g via INTRAVENOUS
  Filled 2021-07-29: qty 50

## 2021-07-29 MED ORDER — SODIUM CHLORIDE 0.9% FLUSH
10.0000 mL | Freq: Two times a day (BID) | INTRAVENOUS | Status: DC
  Administered 2021-07-29 – 2021-07-30 (×2): 10 mL

## 2021-07-29 NOTE — Progress Notes (Signed)
eLink Physician-Brief Progress Note Patient Name: Veronica [name] DOB: 15-Dec-1970 MRN: [id number]   Date of Service  07/11/2021  HPI/Events of Note  Pt is awake and alert.  She wishes to change her goals of care to comfort-only.    eICU Interventions  Her family members will be visiting her over the next couple of days.    She remains on levophed.  It is reasonable to wean it down as long as patient is conscious.    Once it is lowered, we will not increase the dose.     Intervention Category Major Interventions: End of life / care limitation discussion  Tilden Dome 08/03/2021, 8:30 PM

## 2021-07-29 NOTE — Sepsis Progress Note (Signed)
eLink monitoring code sepsis.  

## 2021-07-29 NOTE — ED Provider Notes (Signed)
[facility] DEPT Provider Note   CSN: [id number] Arrival date & time: 08/10/2021  0803     History Chief Complaint  Patient presents with   Abdominal Pain   Emesis    Veronica [name] is a 50 y.o. female.   Abdominal Pain Associated symptoms: vomiting   Emesis Associated symptoms: abdominal pain    Patient presents to the emergency room with complaints of severe abdominal pain and vomiting.  The patient has a complex medical history including metastatic breast cancer that has metastasized to the liver and bone.  Patient is currently receiving immunotherapy.  Patient states that she has not been having a lot of pain with her cancer until last evening.  Patient started having multiple episodes with nausea vomiting.  She is also having loose stools.  She started having pain in her upper abdomen that radiates down to her groin area.  The pain is severe in nature.  She is also feeling lightheaded and weak.  She denies any trouble with any fevers.  No chest pain or shortness of breath.  No dysuria.  Patient denies any prior abdominal surgery.  Past Medical History:  Diagnosis Date   Cancer [facility]) 2020   breast Cancer, 2022 liver mets   Chronic low back pain with left-sided sciatica    Family history of leukemia    Family history of stomach cancer    Hypertension    Personal history of chemotherapy    Personal history of radiation therapy    finished june '21    Patient Active Problem List   Diagnosis Date Noted   Hypothyroidism (acquired) 03/25/2021   Bone metastases ([place]) 11/26/2020   Liver metastases ([place]) 11/12/2020   Wound dehiscence 12/03/2019   Pleural effusion    Sinus tachycardia    Essential hypertension    Gastroesophageal reflux disease    SVC syndrome 09/29/2019   COVID-19 virus infection    Angioedema 09/28/2019   Multiple subsegmental pulmonary emboli without acute cor pulmonale ([place]) 09/28/2019   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   SVCO (superior vena cava obstruction) 09/28/2019   Cough    Sepsis ([place]) 08/29/2019   Neutropenia with fever ([place]) 08/29/2019   Hematochezia 08/29/2019   Anemia 08/29/2019   Thrombocytopenia ([place]) 08/29/2019   Port-A-Cath in place 07/24/2019   Goals of care, counseling/discussion 05/17/2019   Family history of leukemia    Family history of stomach cancer    Malignant neoplasm of upper-outer quadrant of right breast in female, estrogen receptor positive ([place]) 05/07/2019   S/P laparoscopic assisted vaginal hysterectomy (LAVH) 05/17/2011    Class: Chronic   FOOT PAIN, RIGHT 07/11/2010    Past Surgical History:  Procedure Laterality Date   APPLICATION OF WOUND VAC N/A 10/16/2019   Procedure: APPLICATION OF WOUND VAC;  Surgeon: Gaye Pollack, MD;  Location: [place];  Service: Vascular;  Laterality: N/A;   BREAST BIOPSY Right 05/02/2019   right clips X2   BREAST LUMPECTOMY Right    06/2019   BREAST LUMPECTOMY WITH RADIOACTIVE SEED AND SENTINEL LYMPH NODE BIOPSY Right 06/12/2019   Procedure: RIGHT BREAST RADIOACTIVE SEED LUMPECTOMY  AND RIGHT AXILLARY SEED TARGETED LYMPH NODE AND [place];  Surgeon: Erroll Luna, MD;  Location: [place];  Service: General;  Laterality: Right;  PEC BLOCK   C/S x2  '98 '[place]  10/02/2019   Procedure: Insertion Central Line Adult;  Surgeon: Harold Barban  W, MD;  Location: [place];  Service: Vascular;;   CESAREAN SECTION     CHOLECYSTECTOMY     I & D EXTREMITY N/A 10/16/2019   Procedure: DEBRIDEMENT of DEHISCED MIDLINE SURGICAL INCISION;  Surgeon: Gaye Pollack, MD;  Location: [place] OR;  Service: Vascular;  Laterality: N/A;   IR IMAGING GUIDED PORT INSERTION  11/24/2020   LAPAROSCOPIC ASSISTED VAGINAL HYSTERECTOMY  05/17/2011   Procedure: LAPAROSCOPIC ASSISTED VAGINAL HYSTERECTOMY;  Surgeon: Sharene Butters;  Location: [facility];  Service: Gynecology;  Laterality: N/A;   Laparoscopic Assisted Vaginal Hysterectomy With Lysis Of Adhesions   PERICARDIAL WINDOW N/A 10/02/2019   Procedure: Pericardial Window;  Surgeon: Serafina Mitchell, MD;  Location: [place];  Service: Vascular;  Laterality: N/A;   PORT-A-CATH REMOVAL  10/02/2019   Procedure: Removal Port-A-Cath;  Surgeon: Serafina Mitchell, MD;  Location: [place];  Service: Vascular;;   PORTACATH PLACEMENT Right 06/12/2019   Procedure: INSERTION PORT-A-CATH WITH ULTRASOUND;  Surgeon: Erroll Luna, MD;  Location: [place];  Service: General;  Laterality: Right;   RE-EXCISION OF BREAST LUMPECTOMY Right 07/17/2019   Procedure: RE-EXCISION OF RIGHT BREAST LUMPECTOMY;  Surgeon: Erroll Luna, MD;  Location: [place];  Service: General;  Laterality: Right;   TUBAL LIGATION     ULTRASOUND GUIDANCE FOR VASCULAR ACCESS  10/02/2019   Procedure: Ultrasound Guidance For Vascular Access;  Surgeon: Serafina Mitchell, MD;  Location: [facility] OR;  Service: Vascular;;   VENOGRAM N/A 10/02/2019   Procedure: Central VENOGRAM;  Surgeon: Serafina Mitchell, MD;  Location: [facility] OR;  Service: Vascular;  Laterality: N/A;     OB History   No obstetric history on file.     Family History  Problem Relation Age of Onset   Arthritis Mother    COPD Mother    Hypertension Mother    Hyperlipidemia Mother    Leukemia Brother 73   Stomach cancer Maternal Grandmother        late 2s    Social History   Tobacco Use   Smoking status: Never   Smokeless tobacco: Never  Vaping Use   Vaping Use: Never used  Substance Use Topics   Alcohol use: Not Currently    Alcohol/week: 2.0 standard drinks    Types: 2 Standard drinks or equivalent per week    Comment: "2-3 a week"   Drug use: No    Home Medications Prior to Admission medications   Medication Sig Start Date End Date Taking? Authorizing Provider  Cholecalciferol (VITAMIN D3) 25 MCG (1000 UT) CHEW Chew 1,000 Units by mouth daily.   Yes [provider]  dexamethasone (DECADRON) 4 MG tablet Take 2 tablets (8 mg) daily for 3 days after chemotherapy. Take with food. 07/02/21  Yes Magrinat, Virgie Dad, MD  ketoconazole (NIZORAL) 2 % cream Apply 1 application topically daily. Patient taking differently: Apply 1 application topically daily as needed for irritation. 04/15/21  Yes Magrinat, Virgie Dad, MD  LANTUS SOLOSTAR 100 UNIT/ML Solostar Pen Inject 10 Units into the skin at bedtime. 07/27/21  Yes [provider]  levothyroxine (SYNTHROID) 100 MCG tablet Take 1 tablet (100 mcg total) by mouth daily before breakfast. 06/09/21  Yes Magrinat, Virgie Dad, MD  loperamide (IMODIUM A-D) 2 MG tablet Take 2 at diarrhea onset, then 1 every 2hr until 12hrs with no BM. May take 2 every 4hrs at night. If diarrhea recurs repeat. 07/02/21  Yes Magrinat, Virgie Dad, MD  metoprolol tartrate (LOPRESSOR) 25 MG  tablet Take 1 tablet (25 mg total) by mouth 2 (two) times daily. 07/23/20 08/06/2021 Yes Magrinat, Virgie Dad, MD  oxyCODONE-acetaminophen (PERCOCET) 5-325 MG tablet Take 1-2 tablets by mouth every 6 (six) hours as needed. 07/17/21  Yes Delo, Nathaneil Canary, MD  prochlorperazine (COMPAZINE) 10 MG tablet Take 1 tablet (10 mg total) by mouth every 6 (six) hours as needed (Nausea or vomiting). 07/02/21  Yes Magrinat, Virgie Dad, MD  rivaroxaban (XARELTO) 20 MG TABS tablet Take 1 tablet (20 mg total) by mouth daily with supper. 11/26/20  Yes Magrinat, Virgie Dad, MD    Allergies    Patient has no known allergies.  Review of Systems   Review of Systems  Gastrointestinal:  Positive for abdominal pain and vomiting.  All other systems reviewed and are negative.  Physical Exam Updated Vital Signs BP (!) 109/54   Pulse (!) 117   Temp 97.6 F (36.4 C) (Oral)   Resp 18   Ht 1.575 m (5\' 2" )   Wt 97.5 kg   SpO2 96%   BMI 39.32 kg/m   Physical Exam Vitals and nursing note reviewed.  Constitutional:      General: She is in acute distress.     Appearance: She is well-developed.  She is ill-appearing.  HENT:     Head: Normocephalic and atraumatic.     Right Ear: External ear normal.     Left Ear: External ear normal.  Eyes:     General: No scleral icterus.       Right eye: No discharge.        Left eye: No discharge.     Conjunctiva/sclera: Conjunctivae normal.  Neck:     Trachea: No tracheal deviation.  Cardiovascular:     Rate and Rhythm: Normal rate and regular rhythm.  Pulmonary:     Effort: Pulmonary effort is normal. No respiratory distress.     Breath sounds: Normal breath sounds. No stridor. No wheezing or rales.  Abdominal:     General: Bowel sounds are normal. There is no distension.     Palpations: Abdomen is soft.     Tenderness: There is abdominal tenderness in the epigastric area. There is guarding. There is no rebound.  Musculoskeletal:        General: No tenderness or deformity.     Cervical back: Neck supple.  Skin:    General: Skin is warm and dry.     Findings: No rash.  Neurological:     General: No focal deficit present.     Mental Status: She is alert.     Cranial Nerves: No cranial nerve deficit (no facial droop, extraocular movements intact, no slurred speech).     Sensory: No sensory deficit.     Motor: No abnormal muscle tone or seizure activity.     Coordination: Coordination normal.  Psychiatric:        Mood and Affect: Mood normal.    ED Results / Procedures / Treatments   Labs (all labs ordered are listed, but only abnormal results are displayed) Labs Reviewed  COMPREHENSIVE METABOLIC PANEL - Abnormal; Notable for the following components:      Result Value   Sodium 129 (*)    Chloride 97 (*)    CO2 18 (*)    Glucose, Bld 509 (*)    Creatinine, Ser 1.38 (*)    Calcium 8.7 (*)    Total Protein 6.2 (*)    Albumin 2.9 (*)    AST 64 (*)  ALT 117 (*)    Alkaline Phosphatase 899 (*)    Total Bilirubin 5.4 (*)    GFR, Estimated 47 (*)    All other components within normal limits  LACTIC ACID, PLASMA -  Abnormal; Notable for the following components:   Lactic Acid, Venous 5.6 (*)    All other components within normal limits  LACTIC ACID, PLASMA - Abnormal; Notable for the following components:   Lactic Acid, Venous 4.3 (*)    All other components within normal limits  PROTIME-INR - Abnormal; Notable for the following components:   Prothrombin Time 21.0 (*)    INR 1.8 (*)    All other components within normal limits  CBC WITH DIFFERENTIAL/PLATELET - Abnormal; Notable for the following components:   WBC 1.6 (*)    RBC 5.40 (*)    Platelets 141 (*)    Neutro Abs 0.1 (*)    Monocytes Absolute 0.0 (*)    All other components within normal limits  BLOOD GAS, VENOUS - Abnormal; Notable for the following components:   pCO2, Ven 29.2 (*)    Bicarbonate 15.4 (*)    Acid-base deficit 8.7 (*)    All other components within normal limits  BASIC METABOLIC PANEL - Abnormal; Notable for the following components:   Sodium 128 (*)    CO2 17 (*)    Glucose, Bld 566 (*)    Creatinine, Ser 1.53 (*)    Calcium 7.7 (*)    GFR, Estimated 42 (*)    All other components within normal limits  BETA-HYDROXYBUTYRIC ACID - Abnormal; Notable for the following components:   Beta-Hydroxybutyric Acid 0.49 (*)    All other components within normal limits  HCG, QUANTITATIVE, PREGNANCY - Abnormal; Notable for the following components:   hCG, Beta Chain, Quant, S 8 (*)    All other components within normal limits  CBG MONITORING, ED - Abnormal; Notable for the following components:   Glucose-Capillary 462 (*)    All other components within normal limits  I-STAT BETA HCG BLOOD, ED (MC, [facility], AP ONLY) - Abnormal; Notable for the following components:   I-stat hCG, quantitative 8.8 (*)    All other components within normal limits  CBG MONITORING, ED - Abnormal; Notable for the following components:   Glucose-Capillary 530 (*)    All other components within normal limits  CBG MONITORING, ED - Abnormal; Notable for  the following components:   Glucose-Capillary 465 (*)    All other components within normal limits  CBG MONITORING, ED - Abnormal; Notable for the following components:   Glucose-Capillary 443 (*)    All other components within normal limits  CBG MONITORING, ED - Abnormal; Notable for the following components:   Glucose-Capillary 306 (*)    All other components within normal limits  RESP PANEL BY RT-PCR (FLU A&B, COVID) ARPGX2  CULTURE, BLOOD (ROUTINE X 2)  CULTURE, BLOOD (ROUTINE X 2)  URINE CULTURE  APTT  CBC WITH DIFFERENTIAL/PLATELET  URINALYSIS, ROUTINE W REFLEX MICROSCOPIC  BASIC METABOLIC PANEL  BASIC METABOLIC PANEL  BASIC METABOLIC PANEL  BETA-HYDROXYBUTYRIC ACID  BETA-HYDROXYBUTYRIC ACID    EKG EKG Interpretation  Date/Time:  Wednesday July 29 2021 08:20:13 EDT Ventricular Rate:  136 PR Interval:  141 QRS Duration: 86 QT Interval:  293 QTC Calculation: 441 R Axis:   31 Text Interpretation: Sinus tachycardia Consider right atrial enlargement Low voltage, precordial leads Nonspecific T abnormalities, lateral leads Since last tracing rate faster Confirmed by Dorie Rank [phone]) on 07/28/2021  9:04:01 AM  Radiology CT ABDOMEN PELVIS W CONTRAST  Result Date: 07/20/2021 CLINICAL DATA:  History of metastatic breast cancer EXAM: CT ABDOMEN AND PELVIS WITH CONTRAST TECHNIQUE: Multidetector CT imaging of the abdomen and pelvis was performed using the standard protocol following bolus administration of intravenous contrast. CONTRAST:  46mL OMNIPAQUE IOHEXOL 350 MG/ML SOLN COMPARISON:  CT abdomen and pelvis dated July 17, 2021 FINDINGS: Lower chest: Small hiatal hernia.  No acute abnormality. Hepatobiliary: Numerous ill-defined low-attenuation liver lesions are unchanged when compared with recent prior exam. New portal venous gas. Trace perihepatic fluid, new compared to prior exam. Cholecystectomy clips. No biliary ductal dilation. Pancreas: Unremarkable. No pancreatic ductal  dilatation or surrounding inflammatory changes. Spleen: Normal in size without focal abnormality. Adrenals/Urinary Tract: Adrenal glands are unremarkable. Kidneys are normal, without renal calculi, focal lesion, or hydronephrosis. Bladder is unremarkable. Stomach/Bowel: Wall thickening of the right colon with extensive pneumatosis seen at the cecum and proximal transverse colon. Mildly dilated proximal small loops of small bowel, likely reactive. Appendix is not visualized. Vascular/Lymphatic: No significant vascular findings are present. No enlarged abdominal or pelvic lymph nodes. Reproductive: Status post hysterectomy. No adnexal masses. Other: Skin thickening of the right breast, unchanged compared to prior. Trace free fluid seen in the pelvis and about the liver. Scattered locules of free intraperitoneal air. Musculoskeletal: Unchanged scattered sclerotic lesions. IMPRESSION: New wall thickening of the right colon with extensive pneumatosis seen at the cecum and proximal transverse colon. There is associated portal venous gas and small locules of free intraperitoneal air. Findings are highly concerning for bowel ischemia. Dilated loops of proximal small bowel with gradual transition point near the area of transverse colon inflammation, findings are likely reactive. Unchanged liver and osseous metastatic disease. Critical Value/emergent results were called by telephone at the time of interpretation on 07/18/2021 at 12:53 pm to provider [facility] , who verbally acknowledged these results. Electronically Signed   By: Yetta Glassman M.D.   On: 07/12/2021 12:54   DG Chest Port 1 View  Result Date: 07/12/2021 CLINICAL DATA:  Sepsis. EXAM: PORTABLE CHEST 1 VIEW COMPARISON:  October 15, 2019. FINDINGS: The heart size and mediastinal contours are within normal limits. Both lungs are clear. Interval placement of left internal jugular Port-A-Cath with distal tip in expected position of cavoatrial junction. The  visualized skeletal structures are unremarkable. IMPRESSION: No active disease. Electronically Signed   By: Marijo Conception M.D.   On: 08/06/2021 09:34    Procedures .Critical Care Performed by: Dorie Rank, MD Authorized by: Dorie Rank, MD   Critical care provider statement:    Critical care time (minutes):  60   Critical care time was exclusive of:  Separately billable procedures and treating other patients and teaching time   Critical care was necessary to treat or prevent imminent or life-threatening deterioration of the following conditions:  Shock and sepsis   Critical care was time spent personally by me on the following activities:  Development of treatment plan with patient or surrogate, discussions with consultants, evaluation of patient's response to treatment, examination of patient, obtaining history from patient or surrogate, ordering and review of laboratory studies, ordering and performing treatments and interventions, ordering and review of radiographic studies, pulse oximetry, re-evaluation of patient's condition and review of old charts   Medications Ordered in ED Medications  lactated ringers infusion ( Intravenous New Bag/Given 07/28/2021 0915)  insulin regular, human (MYXREDLIN) 100 units/ 100 mL infusion (10.5 Units/hr Intravenous Rate/Dose Change 07/16/2021 1332)  dextrose 5 %  in lactated ringers infusion (has no administration in time range)  dextrose 50 % solution 0-50 mL (has no administration in time range)  potassium chloride 10 mEq in 100 mL IVPB (10 mEq Intravenous New Bag/Given 08/02/2021 1258)  norepinephrine (LEVOPHED) 4mg  in 230mL premix infusion (4 mcg/min Intravenous Rate/Dose Change 08/05/2021 1259)  sodium chloride flush (NS) 0.9 % injection 10-40 mL (has no administration in time range)  Chlorhexidine Gluconate Cloth 2 % PADS 6 each (has no administration in time range)  lactated ringers bolus 1,000 mL (0 mLs Intravenous Stopped 07/15/2021 0940)    And  lactated  ringers bolus 1,000 mL (1,000 mLs Intravenous New Bag/Given 07/16/2021 1042)    And  lactated ringers bolus 1,000 mL (0 mLs Intravenous Stopped 07/16/2021 0940)    And  lactated ringers bolus 500 mL (0 mLs Intravenous Stopped 08/05/2021 1140)  ceFEPIme (MAXIPIME) 2 g in sodium chloride 0.9 % 100 mL IVPB (0 g Intravenous Stopped 07/24/2021 0941)  metroNIDAZOLE (FLAGYL) IVPB 500 mg (0 mg Intravenous Stopped 07/11/2021 1046)  fentaNYL (SUBLIMAZE) injection 50 mcg (50 mcg Intravenous Given 08/06/2021 0943)  ondansetron (ZOFRAN) injection 4 mg (4 mg Intravenous Given 07/25/2021 0943)  iohexol (OMNIPAQUE) 350 MG/ML injection 60 mL (60 mLs Intravenous Contrast Given 08/07/2021 1157)  fentaNYL (SUBLIMAZE) injection 50 mcg (50 mcg Intravenous Given 08/03/2021 1136)  ondansetron (ZOFRAN) injection 4 mg (4 mg Intravenous Given 07/14/2021 1332)    ED Course  I have reviewed the triage vital signs and the nursing notes.  Pertinent labs & imaging results that were available during my care of the patient were reviewed by me and considered in my medical decision making (see chart for details).  Clinical Course as of 07/23/2021 1350  Wed Jul 29, 2021  0903 WBC 1.6, neutrophil 0.1.  consistent with severe neutropenia [JK]  0938 Hyperlgycemia and acidosis noted.  AKI also noted.  ?DKA, dehydration [JK]  1031 BP remains low [JK]  1043 Still hypotensive.  2 liters administered so far.  Will give additional fluids.  Start pressors [JK]  1225 Blood pressure improving.  Now 137/96 [JK]  1226 Initial lactic acid level elevated at 4.3 [JK]  1226 VBG without signs of acidosis [JK]  1226 Chest x-ray without acute findings [JK]  1247 CT scan shows profound wall thickening right colon.  Pneumatosis, free air and portal venous gas.  Possible ischemic colon [JK]    Clinical Course User Index [JK] Dorie Rank, MD   MDM Rules/Calculators/A&P                          Presentation concerning with her hypotension and tachycardia.  Broad  differential including sepsis, bowel perforation, obstruction, abdominal vascular catastrophe.  We will proceed with resuscitation plan on CT imaging.  Patient remained persistently hypotensive despite initial IV fluid bolus.  IV fluids were continued and she was started on a Levophed infusion for her hypotension.  Lactic acid level was elevated suggesting sepsis.  Labs were also noted for hyperglycemia and acidosis.  Beta hydroxybutyrate elevated suggesting possible component of DKA although patient is not acidotic venous blood gas.  IV insulin administered.  CT scan did not show evidence of infection in the right colon.  Patient also has pneumatosis and portal venous gas.  Findings concerning for possible bowel perforation ischemic colon.  Typhlitis is a consideration with her neutropenia.  Patient has been started on broad-spectrum antibiotics.  I have consulted with oncology, general surgery and critical care  medicine.  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

## 2021-07-29 NOTE — Consult Note (Addendum)
[facility] Surgery Consult Note  Veronica [name] 03-21-1971  [id number].    Requesting MD: Dorie Rank Chief Complaint/Reason for Consult: pneumoperitoneum   HPI:  Veronica [name] is a 50yo female PMH metastatic breast cancer on Trodelvy immunotherapy (started ~2 weeks ago due to progression of her disease on MRI) and h/o PE/DVT on xarelto (last dose 10/18 in evening) who presented to [facility] earlier today complaining of abdominal pain. States that she had pain about 1 week ago she was seen in the ED for with a CT scan showing no acute abnormalities. Her pain resolved then returned last night. She reports diffuse abdominal pain that is constant. Associated with multiple episodes of nausea, vomiting, and diarrhea. Denies fever or chills. She also feels lightheaded and weak. In the ED patient was found to be tachycardic and hypotensive. She is on levophed. Labwork significant for lactic acid 5.6, WBC 1.6, glucose 5.9, Cr 1.38, and elevated LFTs/INR. CT scan shows new wall thickening of the right colon with extensive pneumatosis seen at the cecum and proximal transverse colon with associated portal venous gas and small locules of free intraperitoneal air; findings are highly concerning for bowel ischemia. Patient is being admitted to CCM. General surgery asked to see.   Abdominal surgical history: cholecystectomy, hysterectomy, c section x2   Review of Systems  Constitutional:  Positive for malaise/fatigue. Negative for fever.  Respiratory: Negative.    Cardiovascular: Negative.   Gastrointestinal:  Positive for abdominal pain, diarrhea, nausea and vomiting.   All systems reviewed and otherwise negative except for as above  Family History  Problem Relation Age of Onset   Arthritis Mother    COPD Mother    Hypertension Mother    Hyperlipidemia Mother    Leukemia Brother 43   Stomach cancer Maternal Grandmother        late 33s    Past Medical History:  Diagnosis Date   Cancer ([place])  2020   breast Cancer, 2022 liver mets   Chronic low back pain with left-sided sciatica    Family history of leukemia    Family history of stomach cancer    Hypertension    Personal history of chemotherapy    Personal history of radiation therapy    finished june '21    Past Surgical History:  Procedure Laterality Date   APPLICATION OF WOUND VAC N/A 10/16/2019   Procedure: APPLICATION OF WOUND VAC;  Surgeon: Gaye Pollack, MD;  Location: [place];  Service: Vascular;  Laterality: N/A;   BREAST BIOPSY Right 05/02/2019   right clips X2   BREAST LUMPECTOMY Right    06/2019   BREAST LUMPECTOMY WITH RADIOACTIVE SEED AND SENTINEL LYMPH NODE BIOPSY Right 06/12/2019   Procedure: RIGHT BREAST RADIOACTIVE SEED LUMPECTOMY  AND RIGHT AXILLARY SEED TARGETED LYMPH NODE AND AXILLARY SENTINEL LYMPH NODE [place];  Surgeon: Erroll Luna, MD;  Location: [place];  Service: General;  Laterality: Right;  PEC BLOCK   C/S x2  '98 '03   CENTRAL VENOUS CATHETER INSERTION  10/02/2019   Procedure: Insertion Central Line Adult;  Surgeon: Serafina Mitchell, MD;  Location: [facility] OR;  Service: Vascular;;   CESAREAN SECTION     CHOLECYSTECTOMY     I & D EXTREMITY N/A 10/16/2019   Procedure: DEBRIDEMENT of DEHISCED MIDLINE SURGICAL INCISION;  Surgeon: Gaye Pollack, MD;  [facility];  Service: Vascular;  Laterality: N/A;   IR IMAGING GUIDED PORT INSERTION  11/24/2020   LAPAROSCOPIC ASSISTED VAGINAL HYSTERECTOMY  05/17/2011   Procedure: LAPAROSCOPIC ASSISTED VAGINAL HYSTERECTOMY;  Surgeon: Sharene Butters;  Location: [place] [facility];  Service: Gynecology;  Laterality: N/A;  Laparoscopic Assisted Vaginal Hysterectomy With Lysis Of Adhesions   PERICARDIAL WINDOW N/A 10/02/2019   Procedure: Pericardial Window;  Surgeon: Serafina Mitchell, MD;  Location: [place];  Service: Vascular;  Laterality: N/A;   PORT-A-CATH REMOVAL  10/02/2019   Procedure: Removal Port-A-Cath;  Surgeon: Serafina Mitchell, MD;  Location: [place];   Service: Vascular;;   PORTACATH PLACEMENT Right 06/12/2019   Procedure: INSERTION PORT-A-CATH WITH ULTRASOUND;  Surgeon: Erroll Luna, MD;  Location: [place];  Service: General;  Laterality: Right;   RE-EXCISION OF BREAST LUMPECTOMY Right 07/17/2019   Procedure: RE-EXCISION OF RIGHT BREAST LUMPECTOMY;  Surgeon: Erroll Luna, MD;  Location: [place];  Service: General;  Laterality: Right;   TUBAL LIGATION     ULTRASOUND GUIDANCE FOR VASCULAR ACCESS  10/02/2019   Procedure: Ultrasound Guidance For Vascular Access;  Surgeon: Serafina Mitchell, MD;  Location: [facility];  Service: Vascular;;   VENOGRAM N/A 10/02/2019   Procedure: Central VENOGRAM;  Surgeon: Serafina Mitchell, MD;  Location: [place];  Service: Vascular;  Laterality: N/A;    Social History:  reports that she has never smoked. She has never used smokeless tobacco. She reports that she does not currently use alcohol after a past usage of about 2.0 standard drinks per week. She reports that she does not use drugs.  Allergies: No Known Allergies  (Not in a hospital admission)   Prior to Admission medications   Medication Sig Start Date End Date Taking? Authorizing Provider  Cholecalciferol (VITAMIN D3) 25 MCG (1000 UT) CHEW Chew 1,000 Units by mouth daily.   Yes [provider]  dexamethasone (DECADRON) 4 MG tablet Take 2 tablets (8 mg) daily for 3 days after chemotherapy. Take with food. 07/02/21  Yes Magrinat, Virgie Dad, MD  ketoconazole (NIZORAL) 2 % cream Apply 1 application topically daily. Patient taking differently: Apply 1 application topically daily as needed for irritation. 04/15/21  Yes Magrinat, Virgie Dad, MD  LANTUS SOLOSTAR 100 UNIT/ML Solostar Pen Inject 10 Units into the skin at bedtime. 07/27/21  Yes [provider]  levothyroxine (SYNTHROID) 100 MCG tablet Take 1 tablet (100 mcg total) by mouth daily before breakfast. 06/09/21  Yes Magrinat, Virgie Dad, MD  loperamide (IMODIUM  A-D) 2 MG tablet Take 2 at diarrhea onset, then 1 every 2hr until 12hrs with no BM. May take 2 every 4hrs at night. If diarrhea recurs repeat. 07/02/21  Yes Magrinat, Virgie Dad, MD  metoprolol tartrate (LOPRESSOR) 25 MG tablet Take 1 tablet (25 mg total) by mouth 2 (two) times daily. 07/23/20 07/27/2021 Yes Magrinat, Virgie Dad, MD  oxyCODONE-acetaminophen (PERCOCET) 5-325 MG tablet Take 1-2 tablets by mouth every 6 (six) hours as needed. 07/17/21  Yes Delo, Nathaneil Canary, MD  prochlorperazine (COMPAZINE) 10 MG tablet Take 1 tablet (10 mg total) by mouth every 6 (six) hours as needed (Nausea or vomiting). 07/02/21  Yes Magrinat, Virgie Dad, MD  rivaroxaban (XARELTO) 20 MG TABS tablet Take 1 tablet (20 mg total) by mouth daily with supper. 11/26/20  Yes Magrinat, Virgie Dad, MD    Blood pressure (!) 109/54, pulse (!) 117, temperature 97.6 F (36.4 C), temperature source Oral, resp. rate 18, height 5\' 2"  (1.575 m), weight 97.5 kg, SpO2 96 %. Physical Exam: General: pleasant, chronically ill female who is laying in bed in NAD HEENT: head is normocephalic,  atraumatic.  Sclera are noninjected.  Pupils equal and round.  Ears and nose without any masses or lesions.  Mouth is pink and moist. Dentition fair Heart: tachycardic.  Normal s1,s2. No obvious murmurs, gallops, or rubs noted.  Palpable pedal pulses bilaterally  Lungs: CTAB, no wheezes, rhonchi, or rales noted.  Respiratory effort nonlabored. Port left chest Abd: soft, diffusely tender with guarding and peritonitis, no masses, hernias, or organomegaly MS: no BUE/BLE edema, calves soft and nontender Skin: warm and dry with no masses, lesions, or rashes Psych: A&Ox4 with an appropriate affect Neuro: cranial nerves grossly intact, MAEs, normal speech, thought process intact  Results for orders placed or performed during the hospital encounter of 08/10/2021 (from the past 48 hour(s))  Comprehensive metabolic panel     Status: Abnormal   Collection Time: 07/20/2021  8:25  AM  Result Value Ref Range   Sodium 129 (L) 135 - 145 mmol/L   Potassium 3.9 3.5 - 5.1 mmol/L   Chloride 97 (L) 98 - 111 mmol/L   CO2 18 (L) 22 - 32 mmol/L   Glucose, Bld 509 (HH) 70 - 99 mg/dL    Comment: Glucose reference range applies only to samples taken after fasting for at least 8 hours. REPEATED TO VERIFY CRITICAL RESULT CALLED TO, READ BACK BY AND VERIFIED WITH: WOODY,A. RN AT 1914 07/27/2021 MULLINS,T    BUN 16 6 - 20 mg/dL   Creatinine, Ser 1.38 (H) 0.44 - 1.00 mg/dL   Calcium 8.7 (L) 8.9 - 10.3 mg/dL   Total Protein 6.2 (L) 6.5 - 8.1 g/dL   Albumin 2.9 (L) 3.5 - 5.0 g/dL   AST 64 (H) 15 - 41 U/L   ALT 117 (H) 0 - 44 U/L   Alkaline Phosphatase 899 (H) 38 - 126 U/L   Total Bilirubin 5.4 (H) 0.3 - 1.2 mg/dL   GFR, Estimated 47 (L) >60 mL/min    Comment: (NOTE) Calculated using the CKD-EPI Creatinine Equation (2021)    Anion gap 14 5 - 15    Comment: Performed at [facility], [place] [address], [place], [place] [address]  Lactic acid, plasma     Status: Abnormal   Collection Time: 08/10/2021  8:25 AM  Result Value Ref Range   Lactic Acid, Venous 5.6 (HH) 0.5 - 1.9 mmol/L    Comment: CRITICAL RESULT CALLED TO, READ BACK BY AND VERIFIED WITHYetta Barre RN AT [phone] 07/24/2021 MULLINS,T Performed at [facility], [place] [address] [place], [place] [address]   Protime-INR     Status: Abnormal   Collection Time: 07/26/2021  8:25 AM  Result Value Ref Range   Prothrombin Time 21.0 (H) 11.4 - 15.2 seconds   INR 1.8 (H) 0.8 - 1.2    Comment: (NOTE) INR goal varies based on device and disease states. Performed at [facility], [place] [address] [place],  [address]   Beta-hydroxybutyric acid     Status: Abnormal   Collection Time: 07/16/2021  8:25 AM  Result Value Ref Range   Beta-Hydroxybutyric Acid 0.49 (H) 0.05 - 0.27 mmol/L    Comment: Performed at [facility], [place] [address] [place],  [address]   hCG, quantitative, pregnancy     Status: Abnormal   Collection Time: 08/06/2021  8:25 AM  Result Value Ref Range   hCG, Beta Chain, Quant, S 8 (H) <5 mIU/mL    Comment:          GEST. AGE  CONC.  (mIU/mL)   <=1 WEEK        5 - 50     2 WEEKS       50 - 500     3 WEEKS       100 - 10,000     4 WEEKS     1,000 - 30,000     5 WEEKS     3,500 - 115,000   6-8 WEEKS     12,000 - 270,000    12 WEEKS     15,000 - 220,000        FEMALE AND NON-PREGNANT FEMALE:     LESS THAN 5 mIU/mL Performed at [facility], [place] [address] [place], [place] [address]   CBG monitoring, ED     Status: Abnormal   Collection Time: 08/04/2021  8:27 AM  Result Value Ref Range   Glucose-Capillary 462 (H) 70 - 99 mg/dL    Comment: Glucose reference range applies only to samples taken after fasting for at least 8 hours.  CBC with Differential/Platelet     Status: Abnormal   Collection Time: 07/28/2021  8:52 AM  Result Value Ref Range   WBC 1.6 (L) 4.0 - 10.5 K/uL   RBC 5.40 (H) 3.87 - 5.11 MIL/uL   Hemoglobin 14.7 12.0 - 15.0 g/dL   HCT 44.1 36.0 - 46.0 %   MCV 81.7 80.0 - 100.0 fL   MCH 27.2 26.0 - 34.0 pg   MCHC 33.3 30.0 - 36.0 g/dL   RDW 13.1 11.5 - 15.5 %   Platelets 141 (L) 150 - 400 K/uL   nRBC 0.0 0.0 - 0.2 %   Neutrophils Relative % 10 %   Neutro Abs 0.1 (LL) 1.7 - 7.7 K/uL    Comment: REPEATED TO VERIFY THIS CRITICAL RESULT HAS VERIFIED AND BEEN CALLED TO KERRI S. BY JOLANDE MECIAL ON 10 19 2022 AT 0931, AND HAS BEEN READ BACK.     Lymphocytes Relative 86 %   Lymphs Abs 1.2 0.7 - 4.0 K/uL   Monocytes Relative 2 %   Monocytes Absolute 0.0 (L) 0.1 - 1.0 K/uL   Eosinophils Relative 0 %   Eosinophils Absolute 0.0 0.0 - 0.5 K/uL   Basophils Relative 1 %   Basophils Absolute 0.0 0.0 - 0.1 K/uL   Immature Granulocytes 1 %   Abs Immature Granulocytes 0.01 0.00 - 0.07 K/uL   Reactive, Benign Lymphocytes PRESENT     Comment: Performed at [facility], [place]  [address], [place], [place] [address]  Resp Panel by RT-PCR (Flu A&B, Covid) Nasopharyngeal Swab     Status: None   Collection Time: 07/31/2021  8:55 AM   Specimen: Nasopharyngeal Swab; Nasopharyngeal(NP) swabs in vial transport medium  Result Value Ref Range   SARS Coronavirus 2 by RT PCR NEGATIVE NEGATIVE    Comment: (NOTE) SARS-CoV-2 target nucleic acids are NOT DETECTED.  The SARS-CoV-2 RNA is generally detectable in upper respiratory specimens during the acute phase of infection. The lowest concentration of SARS-CoV-2 viral copies this assay can detect is 138 copies/mL. A negative result does not preclude SARS-Cov-2 infection and should not be used as the sole basis for treatment or other patient management decisions. A negative result may occur with  improper specimen collection/handling, submission of specimen other than nasopharyngeal swab, presence of viral mutation(s) within the areas targeted by this assay, and inadequate number of viral copies(<138 copies/mL). A negative result must be combined with clinical observations,  patient history, and epidemiological information. The expected result is Negative.  Fact Sheet for Patients:  EntrepreneurPulse.com.au  Fact Sheet for Healthcare Providers:  IncredibleEmployment.be  This test is no t yet approved or cleared by the [place] FDA and  has been authorized for detection and/or diagnosis of SARS-CoV-2 by FDA under an Emergency Use Authorization (EUA). This EUA will remain  in effect (meaning this test can be used) for the duration of the COVID-19 declaration under Section 564(b)(1) of the Act, 21 U.S.C.section 360bbb-3(b)(1), unless the authorization is terminated  or revoked sooner.       Influenza A by PCR NEGATIVE NEGATIVE   Influenza B by PCR NEGATIVE NEGATIVE    Comment: (NOTE) The Xpert Xpress SARS-CoV-2/FLU/RSV plus assay is intended as an aid in the diagnosis of influenza  from Nasopharyngeal swab specimens and should not be used as a sole basis for treatment. Nasal washings and aspirates are unacceptable for Xpert Xpress SARS-CoV-2/FLU/RSV testing.  Fact Sheet for Patients: EntrepreneurPulse.com.au  Fact Sheet for Healthcare Providers: IncredibleEmployment.be  This test is not yet approved or cleared by the [place] FDA and has been authorized for detection and/or diagnosis of SARS-CoV-2 by FDA under an Emergency Use Authorization (EUA). This EUA will remain in effect (meaning this test can be used) for the duration of the COVID-19 declaration under Section 564(b)(1) of the Act, 21 U.S.C. section 360bbb-3(b)(1), unless the authorization is terminated or revoked.  Performed at [facility], [place] [address], [place], [place] [address]   APTT     Status: None   Collection Time: 07/13/2021  9:15 AM  Result Value Ref Range   aPTT 32 24 - 36 seconds    Comment: Performed at [facility], [place] [address] [place], [place] [address]  Blood gas, venous     Status: Abnormal   Collection Time: 07/22/2021  9:15 AM  Result Value Ref Range   FIO2 21.00    pH, Ven 7.342 7.250 - 7.430   pCO2, Ven 29.2 (L) 44.0 - 60.0 mmHg   pO2, Ven 34.8 32.0 - 45.0 mmHg   Bicarbonate 15.4 (L) 20.0 - 28.0 mmol/L   Acid-base deficit 8.7 (H) 0.0 - 2.0 mmol/L   O2 Saturation 58.0 %   Patient temperature 98.6     Comment: Performed at [facility], [place] [address], [place], [place] [address]  I-Stat beta hCG blood, ED     Status: Abnormal   Collection Time: 08/06/2021  9:20 AM  Result Value Ref Range   I-stat hCG, quantitative 8.8 (H) <5 mIU/mL   Comment 3            Comment:   GEST. AGE      CONC.  (mIU/mL)   <=1 WEEK        5 - 50     2 WEEKS       50 - 500     3 WEEKS       100 - 10,000     4 WEEKS     1,000 - 30,000        FEMALE AND NON-PREGNANT FEMALE:     LESS THAN 5 mIU/mL    CBG monitoring, ED     Status: Abnormal   Collection Time: 07/24/2021 10:52 AM  Result Value Ref Range   Glucose-Capillary 530 (HH) 70 - 99 mg/dL    Comment: Glucose reference range applies only to samples taken after fasting for at least 8 hours.  Lactic acid, plasma     Status: Abnormal   Collection Time: 07/19/2021 10:58 AM  Result Value Ref Range   Lactic Acid, Venous 4.3 (HH) 0.5 - 1.9 mmol/L    Comment: CRITICAL VALUE NOTED.  VALUE IS CONSISTENT WITH PREVIOUSLY REPORTED AND CALLED VALUE. Performed at [facility], [place] [address], [place], [place] [address]   Basic metabolic panel     Status: Abnormal   Collection Time: 07/17/2021 10:58 AM  Result Value Ref Range   Sodium 128 (L) 135 - 145 mmol/L   Potassium 4.0 3.5 - 5.1 mmol/L   Chloride 100 98 - 111 mmol/L   CO2 17 (L) 22 - 32 mmol/L   Glucose, Bld 566 (HH) 70 - 99 mg/dL    Comment: Glucose reference range applies only to samples taken after fasting for at least 8 hours. CRITICAL RESULT CALLED TO, READ BACK BY AND VERIFIED WITH: SAVOIE,B. RN AT 1229 08/02/2021 MULLINS,T    BUN 17 6 - 20 mg/dL   Creatinine, Ser 1.53 (H) 0.44 - 1.00 mg/dL   Calcium 7.7 (L) 8.9 - 10.3 mg/dL   GFR, Estimated 42 (L) >60 mL/min    Comment: (NOTE) Calculated using the CKD-EPI Creatinine Equation (2021)    Anion gap 11 5 - 15    Comment: Performed at [facility], [place] [address], [place], [place] [address]  CBG monitoring, ED     Status: Abnormal   Collection Time: 07/19/2021 11:52 AM  Result Value Ref Range   Glucose-Capillary 465 (H) 70 - 99 mg/dL    Comment: Glucose reference range applies only to samples taken after fasting for at least 8 hours.  CBG monitoring, ED     Status: Abnormal   Collection Time: 07/14/2021 12:34 PM  Result Value Ref Range   Glucose-Capillary 443 (H) 70 - 99 mg/dL    Comment: Glucose reference range applies only to samples taken after fasting for at least 8 hours.  CBG monitoring, ED      Status: Abnormal   Collection Time: 08/01/2021  1:29 PM  Result Value Ref Range   Glucose-Capillary 306 (H) 70 - 99 mg/dL    Comment: Glucose reference range applies only to samples taken after fasting for at least 8 hours.   CT ABDOMEN PELVIS W CONTRAST  Result Date: 08/04/2021 CLINICAL DATA:  History of metastatic breast cancer EXAM: CT ABDOMEN AND PELVIS WITH CONTRAST TECHNIQUE: Multidetector CT imaging of the abdomen and pelvis was performed using the standard protocol following bolus administration of intravenous contrast. CONTRAST:  69mL OMNIPAQUE IOHEXOL 350 MG/ML SOLN COMPARISON:  CT abdomen and pelvis dated July 17, 2021 FINDINGS: Lower chest: Small hiatal hernia.  No acute abnormality. Hepatobiliary: Numerous ill-defined low-attenuation liver lesions are unchanged when compared with recent prior exam. New portal venous gas. Trace perihepatic fluid, new compared to prior exam. Cholecystectomy clips. No biliary ductal dilation. Pancreas: Unremarkable. No pancreatic ductal dilatation or surrounding inflammatory changes. Spleen: Normal in size without focal abnormality. Adrenals/Urinary Tract: Adrenal glands are unremarkable. Kidneys are normal, without renal calculi, focal lesion, or hydronephrosis. Bladder is unremarkable. Stomach/Bowel: Wall thickening of the right colon with extensive pneumatosis seen at the cecum and proximal transverse colon. Mildly dilated proximal small loops of small bowel, likely reactive. Appendix is not visualized. Vascular/Lymphatic: No significant vascular findings are present. No enlarged abdominal or pelvic lymph nodes. Reproductive: Status post hysterectomy. No adnexal masses. Other: Skin thickening of the right breast, unchanged compared to prior. Trace free fluid  seen in the pelvis and about the liver. Scattered locules of free intraperitoneal air. Musculoskeletal: Unchanged scattered sclerotic lesions. IMPRESSION: New wall thickening of the right colon with  extensive pneumatosis seen at the cecum and proximal transverse colon. There is associated portal venous gas and small locules of free intraperitoneal air. Findings are highly concerning for bowel ischemia. Dilated loops of proximal small bowel with gradual transition point near the area of transverse colon inflammation, findings are likely reactive. Unchanged liver and osseous metastatic disease. Critical Value/emergent results were called by telephone at the time of interpretation on 07/13/2021 at 12:53 pm to provider [facility] , who verbally acknowledged these results. Electronically Signed   By: Yetta Glassman M.D.   On: 08/07/2021 12:54   DG Chest Port 1 View  Result Date: 08/05/2021 CLINICAL DATA:  Sepsis. EXAM: PORTABLE CHEST 1 VIEW COMPARISON:  October 15, 2019. FINDINGS: The heart size and mediastinal contours are within normal limits. Both lungs are clear. Interval placement of left internal jugular Port-A-Cath with distal tip in expected position of cavoatrial junction. The visualized skeletal structures are unremarkable. IMPRESSION: No active disease. Electronically Signed   By: Marijo Conception M.D.   On: 07/24/2021 09:34    Anti-infectives (From admission, onward)    Start     Dose/Rate Route Frequency Ordered Stop   07/17/2021 1445  piperacillin-tazobactam (ZOSYN) IVPB 3.375 g        3.375 g 12.5 mL/hr over 240 Minutes Intravenous Every 8 hours 07/11/2021 1439     07/11/2021 0900  ceFEPIme (MAXIPIME) 2 g in sodium chloride 0.9 % 100 mL IVPB        2 g 200 mL/hr over 30 Minutes Intravenous  Once 08/06/2021 0856 08/04/2021 0941   07/28/2021 0900  metroNIDAZOLE (FLAGYL) IVPB 500 mg        500 mg 100 mL/hr over 60 Minutes Intravenous  Once 07/11/2021 0856 07/23/2021 1046         Assessment/Plan Septic shock Ischemic bowel with pneumoperitoneum Pneumatosis of the cecum and proximal transverse colon with portal venous gas - Patient seen and examined with Dr. Donne Hazel. She is ill and in septic  shock from ischemic bowel. Surgery would include exploratory laparotomy and partial colectomy; she would be left open and require a second look 48 hours later for possible removal of more intestine and ileostomy. Unfortunately with her comorbidities she is very high risk of dying with or without surgery. We had a long discussion regarding treatment options including operative vs nonoperative management. She has decided to forgo surgery. She is going to be admitted to the ICU by the critical care team, placed on broad spectrum antibiotics, bowel rest.  We will follow.   ID - zosyn, maxipime, flagyl VTE - hold xarelto FEN - IVF, NPO Foley - none  Metastatic breast cancer on immunotherapy Hx DVT/PE on xarelto HTN DM  Wellington Hampshire, PA-C [facility] 07/19/2021, 1:55 PM Please see Amion for pager number during day hours 7:00am-4:30pm

## 2021-07-29 NOTE — Progress Notes (Signed)
eLink Physician-Brief Progress Note Patient Name: Veronica [name] DOB: 02-06-1971 MRN: [id number]   Date of Service  07/28/2021  HPI/Events of Note  Pt has comfort care orders; is in pain not responding to prn meds  eICU Interventions  Adding a morphine continuous infusion      Intervention Category Major Interventions: End of life / care limitation discussion  Tilden Dome 08/01/2021, 9:38 PM

## 2021-07-29 NOTE — ED Triage Notes (Signed)
Pt arrives POV w/ c/o upper abd pain that goes into her vagina that began approx 3am. Pt also c/o emesis that has been yellow in color. Last time was 6;15 am. Pt is clammy. Pt took percocet but denies relief.  Hx breast and liver CA. Last tx last week

## 2021-07-29 NOTE — ED Notes (Signed)
Purewick placed on pt. 

## 2021-07-29 NOTE — H&P (Signed)
NAME:  Veronica [name], MRN:  [id number], DOB:  [birth year], LOS: 0 ADMISSION DATE:  07/12/2021,  REFERRING MD: Dr. Tomi Bamberger,  CHIEF COMPLAINT: Shock  History of Present Illness:  50 year old woman, never smoker, with a history of breast cancer metastatic to liver 2022, bone, treated with lumpectomy, XRT, chemotherapy.  She started [place] just over 2 weeks ago for progression on liver MRI 06/2021.  She has had some abdominal pain since this was initiated, was in the ED for this on 10/7.  Underwent the second dose on 10/12. Came to the ED 10/19 with severe abdominal pain, nausea, vomiting.  Also starting to have some loose stools.  No blood reported.  Associated with some lightheadedness and weakness.  No fever, dyspnea.  In the ED found to be severely neutropenic, hypotensive with an associated acute renal insufficiency (SCr 0.64 > 1.53).  Also hyperglycemic.  She was given 2 L volume resuscitation and then required initiation of norepinephrine.  Initial lactate 5.6 > 4.3.  VBG was compensated. CT abdomen pelvis 10/19 showed right colonic wall thickening with extensive pneumatosis at the cecum and proximal transverse colon.  Mildly dilated proximal small loops of small bowel.  Trace free fluid in the pelvis with some scattered locules of free intraperitoneal air.  Appendix cannot be seen.  No significant vascular findings.   Pertinent  Medical History   Past Medical History:  Diagnosis Date   Cancer [facility]) 2020   breast Cancer, 2022 liver mets   Chronic low back pain with left-sided sciatica    Family history of leukemia    Family history of stomach cancer    Hypertension    Personal history of chemotherapy    Personal history of radiation therapy    finished june '21    Significant Hospital Events: Including procedures, antibiotic start and stop dates in addition to other pertinent events     Interim History / Subjective:     Objective   Blood pressure (!) 109/54, pulse (!) 117,  temperature 97.6 F (36.4 C), temperature source Oral, resp. rate 18, height 5\' 2"  (1.575 m), weight 97.5 kg, SpO2 96 %.        Intake/Output Summary (Last 24 hours) at 07/19/2021 1406 Last data filed at 07/13/2021 1140 Gross per 24 hour  Intake 2699.41 ml  Output --  Net 2699.41 ml   Filed Weights   08/04/2021 0858  Weight: 97.5 kg    Examination: General: Ill-appearing woman, laying in bed.  No distress HENT: Oropharynx clear, somewhat dry.  Strong voice.  No stridor Lungs: Overall clear, few bibasilar inspiratory crackles.  No wheezes Cardiovascular: Tachycardic, regular, sinus tachycardia on monitor.  No murmur Abdomen: Nondistended, tender to palpation bilateral upper quadrants, no apparent rebound, no bowel sounds heard Extremities: No edema Neuro: Awake, alert, interacting appropriately, follows commands.  Resolved Hospital Problem list     Assessment & Plan:   Shock, presumed septic and hypovolemic shock.  Suspected source is colitis, likely this is neutropenic enterocolitis which can cause perforation, pneumatosis, etc. -Aggressive IV fluid rehydration/resuscitation -Agree with norepinephrine infusion.  If she requires escalating doses then will place CVC to transduce CVP. Otherwise should be able to use her port for access.  -Follow lactate for clearance -Zosyn per pharmacy dosing  -Neutropenic precautions -Check C. difficile -Check stool cultures/O&P -Blood culture, urine culture.  Chest x-ray is reassuring -Bowel rest -Discussed the case with Dr. Donne Hazel in the ED.  Given pneumatosis and intraperitoneal air, surgery would be indicated.  Unfortunately with her severe neutropenia, underlying malignancy, mortality from surgery could be as high as 35%.  Further the morbidity from surgery, likelihood of dependent care and inability to treat her underlying malignancy would be very high.  Based on this the patient and her husband have decided along with Dr. Donne Hazel to  defer surgery and manage medically, hope for stabilization and then recovery. -Discussed case with Dr. Jana Hakim with oncology.  He also has explained the severity of her illness, high mortality of neutropenic enterocolitis to patient.  He has recommended medical care, no escalation to mechanical ventilation, no CPR.  The patient agrees with this plan.  I support medical therapy, DNR/I status. -Dr. Jana Hakim did recommend G-CSF which will be ordered  Acute renal insufficiency due to ATN, hypoperfusion due to shock state -Volume resuscitation -Follow urine output, BMP -Renal dose medications, ensure adequate renal perfusion  Hyperglycemia -Currently on insulin infusion.  Will try to transition to subcutaneous insulin as able -Check beta hydroxybutyrate  Metastatic breast cancer -Unclear that she will be able to continue the [place] going forward. Will have to address therapeutic options if she recovers from acute illness -Neutropenic precautions  Thrombocytopenia, likely due to chemotherapy, acute septic illness -Follow CBC -No evidence of active bleeding at this time   Best Practice (right click and "Reselect all SmartList Selections" daily)   Diet/type: NPO DVT prophylaxis: SCD GI prophylaxis: PPI Lines: N/A Foley:  N/A Code Status:  DNR Last date of multidisciplinary goals of care discussion [Code status addressed by Dr  Jana Hakim on 10/19 >> DNR/I confirmed]  Labs   CBC: Recent Labs  Lab 07/13/2021 0852  WBC 1.6*  NEUTROABS 0.1*  HGB 14.7  HCT 44.1  MCV 81.7  PLT 141*    Basic Metabolic Panel: Recent Labs  Lab 08/07/2021 0825 07/27/2021 1058  NA 129* 128*  K 3.9 4.0  CL 97* 100  CO2 18* 17*  GLUCOSE 509* 566*  BUN 16 17  CREATININE 1.38* 1.53*  CALCIUM 8.7* 7.7*   GFR: Estimated Creatinine Clearance: 49.1 mL/min (A) (by C-G formula based on SCr of 1.53 mg/dL (H)). Recent Labs  Lab 07/28/2021 0825 07/21/2021 0852 08/08/2021 1058  WBC  --  1.6*  --   LATICACIDVEN  5.6*  --  4.3*    Liver Function Tests: Recent Labs  Lab 07/22/2021 0825  AST 64*  ALT 117*  ALKPHOS 899*  BILITOT 5.4*  PROT 6.2*  ALBUMIN 2.9*   No results for input(s): LIPASE, AMYLASE in the last 168 hours. No results for input(s): AMMONIA in the last 168 hours.  ABG    Component Value Date/Time   PHART 7.338 (L) 10/02/2019 2017   PCO2ART 40.7 10/02/2019 2017   PO2ART 353.0 (H) 10/02/2019 2017   HCO3 15.4 (L) 07/13/2021 0915   TCO2 23 10/02/2019 2017   ACIDBASEDEF 8.7 (H) 08/10/2021 0915   O2SAT 58.0 07/20/2021 0915     Coagulation Profile: Recent Labs  Lab 07/16/2021 0825  INR 1.8*    Cardiac Enzymes: No results for input(s): CKTOTAL, CKMB, CKMBINDEX, TROPONINI in the last 168 hours.  HbA1C: No results found for: HGBA1C  CBG: Recent Labs  Lab 07/19/2021 0827 07/17/2021 1052 08/08/2021 1152 08/06/2021 1234 08/07/2021 1329  GLUCAP 462* 530* 465* 443* 306*    Review of Systems:   As per HPI >> still w significant abdominal pain, mild nausea.   Past Medical History:  She,  has a past medical history of Cancer ([place]) (2020), Chronic low back pain  with left-sided sciatica, Family history of leukemia, Family history of stomach cancer, Hypertension, Personal history of chemotherapy, and Personal history of radiation therapy.   Surgical History:   Past Surgical History:  Procedure Laterality Date   APPLICATION OF WOUND VAC N/A 10/16/2019   Procedure: APPLICATION OF WOUND VAC;  Surgeon: Gaye Pollack, MD;  Location: [place];  Service: Vascular;  Laterality: N/A;   BREAST BIOPSY Right 05/02/2019   right clips X2   BREAST LUMPECTOMY Right    06/2019   BREAST LUMPECTOMY WITH RADIOACTIVE SEED AND SENTINEL LYMPH NODE BIOPSY Right 06/12/2019   Procedure: RIGHT BREAST RADIOACTIVE SEED LUMPECTOMY  AND RIGHT AXILLARY SEED TARGETED LYMPH NODE AND AXILLARY SENTINEL LYMPH NODE [place];  Surgeon: Erroll Luna, MD;  Location: [place];  Service: General;   Laterality: Right;  PEC BLOCK   C/S x2  '98 '[place]  10/02/2019   Procedure: Insertion Central Line Adult;  Surgeon: Serafina Mitchell, MD;  Location: [place];  Service: Vascular;;   CESAREAN SECTION     CHOLECYSTECTOMY     I & D EXTREMITY N/A 10/16/2019   Procedure: DEBRIDEMENT of DEHISCED MIDLINE SURGICAL INCISION;  Surgeon: Gaye Pollack, MD;  Location: [place] [facility];  Service: Vascular;  Laterality: N/A;   IR IMAGING GUIDED PORT INSERTION  11/24/2020   LAPAROSCOPIC ASSISTED VAGINAL HYSTERECTOMY  05/17/2011   Procedure: LAPAROSCOPIC ASSISTED VAGINAL HYSTERECTOMY;  Surgeon: Sharene Butters;  Location: [place] [facility];  Service: Gynecology;  Laterality: N/A;  Laparoscopic Assisted Vaginal Hysterectomy With Lysis Of Adhesions   PERICARDIAL WINDOW N/A 10/02/2019   Procedure: Pericardial Window;  Surgeon: Serafina Mitchell, MD;  Location: [place];  Service: Vascular;  Laterality: N/A;   PORT-A-CATH REMOVAL  10/02/2019   Procedure: Removal Port-A-Cath;  Surgeon: Serafina Mitchell, MD;  Location: [place];  Service: Vascular;;   PORTACATH PLACEMENT Right 06/12/2019   Procedure: INSERTION PORT-A-CATH WITH ULTRASOUND;  Surgeon: Erroll Luna, MD;  Location: [place];  Service: General;  Laterality: Right;   RE-EXCISION OF BREAST LUMPECTOMY Right 07/17/2019   Procedure: RE-EXCISION OF RIGHT BREAST LUMPECTOMY;  Surgeon: Erroll Luna, MD;  Location: [place];  Service: General;  Laterality: Right;   TUBAL LIGATION     ULTRASOUND GUIDANCE FOR VASCULAR ACCESS  10/02/2019   Procedure: Ultrasound Guidance For Vascular Access;  Surgeon: Serafina Mitchell, MD;  Location: [facility];  Service: Vascular;;   VENOGRAM N/A 10/02/2019   Procedure: Central VENOGRAM;  Surgeon: Serafina Mitchell, MD;  Location: [place];  Service: Vascular;  Laterality: N/A;     Social History:   reports that she has never smoked. She has never used smokeless tobacco. She reports that she does not  currently use alcohol after a past usage of about 2.0 standard drinks per week. She reports that she does not use drugs.   Family History:  Her family history includes Arthritis in her mother; COPD in her mother; Hyperlipidemia in her mother; Hypertension in her mother; Leukemia (age of onset: 6) in her brother; Stomach cancer in her maternal grandmother.   Allergies No Known Allergies   Home Medications  Prior to Admission medications   Medication Sig Start Date End Date Taking? Authorizing Provider  Cholecalciferol (VITAMIN D3) 25 MCG (1000 UT) CHEW Chew 1,000 Units by mouth daily.   Yes [provider]  dexamethasone (DECADRON) 4 MG tablet Take 2 tablets (8 mg) daily for 3 days after chemotherapy. Take  with food. 07/02/21  Yes Magrinat, Virgie Dad, MD  ketoconazole (NIZORAL) 2 % cream Apply 1 application topically daily. Patient taking differently: Apply 1 application topically daily as needed for irritation. 04/15/21  Yes Magrinat, Virgie Dad, MD  LANTUS SOLOSTAR 100 UNIT/ML Solostar Pen Inject 10 Units into the skin at bedtime. 07/27/21  Yes [provider]  levothyroxine (SYNTHROID) 100 MCG tablet Take 1 tablet (100 mcg total) by mouth daily before breakfast. 06/09/21  Yes Magrinat, Virgie Dad, MD  loperamide (IMODIUM A-D) 2 MG tablet Take 2 at diarrhea onset, then 1 every 2hr until 12hrs with no BM. May take 2 every 4hrs at night. If diarrhea recurs repeat. 07/02/21  Yes Magrinat, Virgie Dad, MD  metoprolol tartrate (LOPRESSOR) 25 MG tablet Take 1 tablet (25 mg total) by mouth 2 (two) times daily. 07/23/20 07/14/2021 Yes Magrinat, Virgie Dad, MD  oxyCODONE-acetaminophen (PERCOCET) 5-325 MG tablet Take 1-2 tablets by mouth every 6 (six) hours as needed. 07/17/21  Yes Delo, Nathaneil Canary, MD  prochlorperazine (COMPAZINE) 10 MG tablet Take 1 tablet (10 mg total) by mouth every 6 (six) hours as needed (Nausea or vomiting). 07/02/21  Yes Magrinat, Virgie Dad, MD  rivaroxaban (XARELTO) 20 MG TABS  tablet Take 1 tablet (20 mg total) by mouth daily with supper. 11/26/20  Yes Magrinat, Virgie Dad, MD     Critical care time: 30 minutes     Baltazar Apo, MD, PhD 08/03/2021, 3:08 PM [facility] [phone] or if no answer before 7:00PM call [phone] For any issues after 7:00PM please call eLink [phone]

## 2021-07-29 NOTE — Progress Notes (Signed)
Pharmacy Antibiotic Note  Veronica [name] is a 50 y.o. female admitted on 07/28/2021 with  intra-abdominal infection and neutropenia .  Pharmacy has been consulted for Zosyn dosing.  WBC 1.6, ANC 100, Lactate 4.3, afebrile (Tm 97.6)  Plan: Zosyn 3.375g IV q8h (4 hour infusion). Monitor clinical improvement, renal function, ability to narrow antibiotics  Height: 5\' 2"  (157.5 cm) Weight: 97.5 kg (215 lb) IBW/kg (Calculated) : 50.1  Temp (24hrs), Avg:97.6 F (36.4 C), Min:97.6 F (36.4 C), Max:97.6 F (36.4 C)  Recent Labs  Lab 07/13/2021 0825 07/26/2021 0852 07/23/2021 1058  WBC  --  1.6*  --   CREATININE 1.38*  --  1.53*  LATICACIDVEN 5.6*  --  4.3*    Estimated Creatinine Clearance: 49.1 mL/min (A) (by C-G formula based on SCr of 1.53 mg/dL (H)).    No Known Allergies  Antimicrobials this admission: Zosyn 10/19 >>  Cefepime 10/19 >> 10/19 Flagyl 10/19 >> 10/19  Dose adjustments this admission: N/A  Microbiology results: 10/19 BCx:  10/19 UCx:     Thank you for allowing pharmacy to be a part of this patient's care.  Dimple Nanas, PharmD 07/19/2021 2:42 PM

## 2021-07-29 NOTE — Progress Notes (Signed)
A consult was received from an ED physician for cefepime per pharmacy dosing.  The patient's profile has been reviewed for ht/wt/allergies/indication/available labs.   A one time order has been placed for cefepime 2g.  Further antibiotics/pharmacy consults should be ordered by admitting physician if indicated.                       Thank you, Peggyann Juba, PharmD, BCPS 08/05/2021  9:00 AM

## 2021-07-29 NOTE — Progress Notes (Unsigned)
COURTESY NOTE:  Veronica [name] presented to the ED today with severe abdominalpain, nausea and vomiting. CT abd shows likely bowel necrosis vs typhlitis. We are treating her for stage 4, incurable breast cancer. She is currently day 15 cycle 1 sacituzumab Dorothey Baseman) and her [place] is 0.1  Surgery has discussed the situation with Charleen and her husband Rob. Dr Donne Hazel quoted them a risk of intraoperative death in the 35% range, additional risks of death from post-op complications if she survives the operation, and the need for a colostomy and SNF placement if she lives through that. Needless to say she would not be able to receive systemic treatment for her stage IV breast cancer during that time. With all that in mind the surgical recommendation was for best supportive care and the patient tells me clearly she does not want surgery.  The plan then is for transfer to ICU, antibiotics, and filgrastim 480 mcg sQ daily x5. She will need aggressive pain support and understands this may make her confused or very lethargic.   I discussed DNR and they understand it would be futile to do CPR or intubation if her hear stops. She is agreeable to a DNR order.  There is an extensive family who will want to visit. I assured them we would make every effort to accommodate them so they can visit within the current ICU guidelines.  I appreciate your help tio this patient and her family! Wil follow with you.

## 2021-07-29 NOTE — Progress Notes (Signed)
Inpatient Diabetes Program Recommendations  AACE/ADA: New Consensus Statement on Inpatient Glycemic Control (2015)  Target Ranges:  Prepandial:   less than 140 mg/dL      Peak postprandial:   less than 180 mg/dL (1-2 hours)      Critically ill patients:  140 - 180 mg/dL   Lab Results  Component Value Date   GLUCAP 141 (H) 08/02/2021   HGBA1C 9.7 (H) 07/22/2021    Review of Glycemic Control  Briefly spoke with pt this afternoon in ICU about diabetes diagnosis and 9.7% HgbA1C.  Due to the severity of pt prognoses the pt and husband have decided to go comfort care.    Will not follow.  Thank you. Lorenda Peck, RD, LDN, CDE Inpatient Diabetes Coordinator [phone]

## 2021-07-29 NOTE — Sepsis Progress Note (Signed)
Notified bedside nurse of need to draw repeat lactic acid. 

## 2021-07-30 DIAGNOSIS — C50411 Malignant neoplasm of upper-outer quadrant of right female breast: Secondary | ICD-10-CM

## 2021-07-30 DIAGNOSIS — K37 Unspecified appendicitis: Secondary | ICD-10-CM | POA: Diagnosis not present

## 2021-07-30 DIAGNOSIS — K5289 Other specified noninfective gastroenteritis and colitis: Secondary | ICD-10-CM | POA: Diagnosis not present

## 2021-07-30 DIAGNOSIS — A419 Sepsis, unspecified organism: Secondary | ICD-10-CM | POA: Diagnosis not present

## 2021-07-30 DIAGNOSIS — K559 Vascular disorder of intestine, unspecified: Secondary | ICD-10-CM | POA: Diagnosis not present

## 2021-07-30 DIAGNOSIS — C787 Secondary malignant neoplasm of liver and intrahepatic bile duct: Secondary | ICD-10-CM | POA: Diagnosis not present

## 2021-07-30 DIAGNOSIS — D709 Neutropenia, unspecified: Secondary | ICD-10-CM | POA: Diagnosis not present

## 2021-07-30 DIAGNOSIS — C7951 Secondary malignant neoplasm of bone: Secondary | ICD-10-CM

## 2021-07-30 DIAGNOSIS — Z170 Estrogen receptor positive status [ER+]: Secondary | ICD-10-CM

## 2021-07-30 LAB — GLUCOSE, CAPILLARY: Glucose-Capillary: 124 mg/dL — ABNORMAL HIGH (ref 70–99)

## 2021-07-30 MED ORDER — ONDANSETRON HCL 4 MG/2ML IJ SOLN: 4.0000 mg | Freq: Four times a day (QID) | INTRAMUSCULAR | Status: DC | PRN

## 2021-07-30 MED ORDER — LORAZEPAM 2 MG/ML IJ SOLN: 0.5000 mg | INTRAMUSCULAR | Status: DC | PRN

## 2021-08-01 LAB — CULTURE, BLOOD (ROUTINE X 2)

## 2021-08-05 ENCOUNTER — Ambulatory Visit: Payer: 59

## 2021-08-05 ENCOUNTER — Ambulatory Visit: Payer: 59 | Admitting: Oncology

## 2021-08-05 ENCOUNTER — Other Ambulatory Visit: Payer: 59

## 2021-08-05 ENCOUNTER — Other Ambulatory Visit: Payer: Self-pay

## 2021-08-08 DIAGNOSIS — N179 Acute kidney failure, unspecified: Secondary | ICD-10-CM | POA: Diagnosis present

## 2021-08-08 DIAGNOSIS — E162 Hypoglycemia, unspecified: Secondary | ICD-10-CM | POA: Diagnosis present

## 2021-08-11 NOTE — Death Summary Note (Signed)
DEATH SUMMARY   Patient Details  Name: Veronica [name] MRN: [id number] DOB: [birth year]  Admission/Discharge Information   Admit Date:  2021/08/01  Date of Death: Date of Death: 2021/08/02  Time of Death: Time of Death: December 28, 1721  Length of Stay: 1  Referring Physician: Ramiro Harvest, PA-C   Reason(s) for Hospitalization  Severe abdominal pain  Diagnoses  Preliminary cause of death:  Neutropenic enterocolitis (typhlitis)  Secondary Diagnoses (including complications and co-morbidities):  Principal Problem:   Neutropenic typhlitis ([place]) Active Problems:   Malignant neoplasm of upper-outer quadrant of right breast in female, estrogen receptor positive ([place])   Multiple subsegmental pulmonary emboli without acute cor pulmonale (HCC)   Liver metastases (HCC)   Bone metastases (HCC)   Hypothyroidism (acquired)   Septic shock (HCC)   Ischemic bowel disease ([place])   Acute renal failure (ARF) ([place])   Hypoglycemia   Brief Hospital Course (including significant findings, care, treatment, and services provided and events leading to death)  Veronica [name] is a 50 y.o. year old female with a history of breast cancer metastatic to liver 12/28/2020, bone, treated with lumpectomy, XRT, chemotherapy.  She started [place] just over 2 weeks ago for progression on liver MRI 06/2021.  She has had some abdominal pain since this was initiated, was in the ED for this on 10/7.  Underwent the second dose on 10/12. She also has a hx of pulmonary embolism in 10/2019, hypertension, hypothyroidism.  She came to the ED Aug 02, 2023 with severe abdominal pain, nausea, vomiting.  Also starting to have some loose stools.  No blood reported.  Associated with some lightheadedness and weakness.  No fever, dyspnea.  In the ED she was found to be severely neutropenic, hypotensive with an associated acute renal insufficiency (SCr 0.64 > 1.53).  Also hyperglycemic.  She was given 2 L volume resuscitation and then required initiation of  norepinephrine.  Initial lactate 5.6 > 4.3.    CT abdomen pelvis 02-Aug-2023 showed right colonic wall thickening with extensive pneumatosis at the cecum and proximal transverse colon.  Mildly dilated proximal small loops of small bowel.  Trace free fluid in the pelvis with some scattered locules of free intraperitoneal air.  Appendix cannot be seen.  No significant vascular findings.  Presentation unfortunately consistent with neutropenic enterocolitis, associated bowel ischemia and septic shock.  She was started on broad-spectrum antibiotics and pressors, volume resuscitation, pain control.  She was seen by general surgery and possible surgical intervention was considered but it was noted that there is a high risk procedure with a mortality up to 35%.  Further the patient would likely need to be dependent for her care for the rest of her life which was not consistent with her goals.  Surgery was deferred.  Her CODE STATUS was changed to DNR/DNI she was treated conservatively, medically.  She did receive G-CSF.  Patient's blood pressure progressively worsened as did her renal function felt encephalopathy.  Every effort was made to concentrate on her comfort.  She expired peacefully 02-Aug-2021 at 17:23.      Pertinent Labs and Studies  Significant Diagnostic Studies CT ABDOMEN PELVIS W CONTRAST  Result Date: 08-01-21 CLINICAL DATA:  History of metastatic breast cancer EXAM: CT ABDOMEN AND PELVIS WITH CONTRAST TECHNIQUE: Multidetector CT imaging of the abdomen and pelvis was performed using the standard protocol following bolus administration of intravenous contrast. CONTRAST:  25mL OMNIPAQUE IOHEXOL 350 MG/ML SOLN COMPARISON:  CT abdomen and pelvis dated July 17, 2021 FINDINGS: Lower chest:  Small hiatal hernia.  No acute abnormality. Hepatobiliary: Numerous ill-defined low-attenuation liver lesions are unchanged when compared with recent prior exam. New portal venous gas. Trace perihepatic fluid, new  compared to prior exam. Cholecystectomy clips. No biliary ductal dilation. Pancreas: Unremarkable. No pancreatic ductal dilatation or surrounding inflammatory changes. Spleen: Normal in size without focal abnormality. Adrenals/Urinary Tract: Adrenal glands are unremarkable. Kidneys are normal, without renal calculi, focal lesion, or hydronephrosis. Bladder is unremarkable. Stomach/Bowel: Wall thickening of the right colon with extensive pneumatosis seen at the cecum and proximal transverse colon. Mildly dilated proximal small loops of small bowel, likely reactive. Appendix is not visualized. Vascular/Lymphatic: No significant vascular findings are present. No enlarged abdominal or pelvic lymph nodes. Reproductive: Status post hysterectomy. No adnexal masses. Other: Skin thickening of the right breast, unchanged compared to prior. Trace free fluid seen in the pelvis and about the liver. Scattered locules of free intraperitoneal air. Musculoskeletal: Unchanged scattered sclerotic lesions. IMPRESSION: New wall thickening of the right colon with extensive pneumatosis seen at the cecum and proximal transverse colon. There is associated portal venous gas and small locules of free intraperitoneal air. Findings are highly concerning for bowel ischemia. Dilated loops of proximal small bowel with gradual transition point near the area of transverse colon inflammation, findings are likely reactive. Unchanged liver and osseous metastatic disease. Critical Value/emergent results were called by telephone at the time of interpretation on 08/06/2021 at 12:53 pm to provider [facility] , who verbally acknowledged these results. Electronically Signed   By: Yetta Glassman M.D.   On: 07/15/2021 12:54   CT ABDOMEN PELVIS W CONTRAST  Result Date: 07/17/2021 CLINICAL DATA:  50 year old female with history of abdominal pain. Suspected abdominal infection or abscess. History of breast cancer with liver metastasis. Ongoing chemotherapy.  EXAM: CT ABDOMEN AND PELVIS WITH CONTRAST TECHNIQUE: Multidetector CT imaging of the abdomen and pelvis was performed using the standard protocol following bolus administration of intravenous contrast. CONTRAST:  82mL OMNIPAQUE IOHEXOL 350 MG/ML SOLN COMPARISON:  No prior CT the abdomen and pelvis. Abdominal MRI 06/30/2021. Chest CTA 10/07/2020. FINDINGS: Lower chest: Scarring in the periphery of the right lower lobe, similar to prior chest CTA 10/07/2020. Diffuse skin thickening in the right breast. Hepatobiliary: Although difficult to compare with prior abdominal MRI 06/30/2021 given the differences in modalities, there does appear to be an increase in number and size of numerous hypovascular hepatic lesions on today's examination, concerning for progression of metastatic disease to the liver. Amongst the discrete lesions, specific examples of enlarging lesions include a 2.8 x 1.9 cm lesion in segment 2 (axial image 8 of series 2) which previously measured 2.2 x 1.3 cm on axial image 8 of series 2 of the prior MRI examination 06/30/2021. Another lesion which has clearly progressed is in the right lobe of the liver in segment 6 (axial image 32 of series 2) which currently measures 2.9 x 2.1 cm (previously approximately 1.5 x 1.1 cm on axial image 23 of series 2). No intra or extrahepatic biliary ductal dilatation. Status post cholecystectomy. Pancreas: No pancreatic mass. No pancreatic ductal dilatation. No pancreatic or peripancreatic fluid collections or inflammatory changes. Spleen: Unremarkable. Adrenals/Urinary Tract: Bilateral kidneys and bilateral adrenal glands are normal in appearance. No hydroureteronephrosis. Urinary bladder is normal in appearance. Stomach/Bowel: The appearance of the stomach is normal. There is no pathologic dilatation of small bowel or colon. Normal appendix. Vascular/Lymphatic: No significant atherosclerotic disease, aneurysm or dissection noted in the abdominal or pelvic  vasculature. No lymphadenopathy noted  in the abdomen or pelvis. Reproductive: Status post hysterectomy. Ovaries are unremarkable in appearance. Other: Trace volume of ascites in the low anatomic pelvis. No pneumoperitoneum. Musculoskeletal: Sclerotic lesions are noted throughout the visualized axial and appendicular skeleton, concerning for metastatic disease to the bones, largest of which is in the left femoral neck measuring approximately 3.2 x 2.7 x 5.5 cm (axial image 87 of series 2 and coronal image 74 of series 4). IMPRESSION: 1. Today's study demonstrates probable progression of metastatic disease throughout the liver, as discussed above. 2. No other acute findings are noted in the abdomen or pelvis to account for the patient's symptoms. 3. Trace volume of ascites. 4. Sclerotic lesions in the bones concerning for metastatic disease, largest of which is in the left femoral neck. Electronically Signed   By: Vinnie Langton M.D.   On: 07/17/2021 06:46   DG Chest Port 1 View  Result Date: 07/25/2021 CLINICAL DATA:  Sepsis. EXAM: PORTABLE CHEST 1 VIEW COMPARISON:  October 15, 2019. FINDINGS: The heart size and mediastinal contours are within normal limits. Both lungs are clear. Interval placement of left internal jugular Port-A-Cath with distal tip in expected position of cavoatrial junction. The visualized skeletal structures are unremarkable. IMPRESSION: No active disease. Electronically Signed   By: Marijo Conception M.D.   On: 07/18/2021 09:34    Microbiology Recent Results (from the past 240 hour(s))  MRSA Next Gen by PCR, Nasal     Status: None   Collection Time: 07/27/2021  4:04 PM   Specimen: Nasal Mucosa; Nasal Swab  Result Value Ref Range Status   MRSA by PCR Next Gen NOT DETECTED NOT DETECTED Final    Comment: (NOTE) The GeneXpert MRSA Assay (FDA approved for NASAL specimens only), is one component of a comprehensive MRSA colonization surveillance program. It is not intended to diagnose  MRSA infection nor to guide or monitor treatment for MRSA infections. Test performance is not FDA approved in patients less than 84 years old. Performed at [facility], [place] [address], [place], [place] [address]      Collene Gobble 08/08/2021, 3:22 PM

## 2021-08-11 NOTE — Progress Notes (Signed)
PHARMACY - PHYSICIAN COMMUNICATION CRITICAL VALUE ALERT - BLOOD CULTURE IDENTIFICATION (BCID)  Veronica [name] is an 50 y.o. female who presented to [facility] on 07/28/2021 with a chief complaint of abdominal pain.  Assessment:  gram + rods, 1/2 anaerobic bottle  Name of physician (or Provider) Contacted: none  Current antibiotics: none  Changes to prescribed antibiotics recommended:  None, pt is full comfort care, all abx have been discontinued  No results found for this or any previous visit.  Dolly Rias RPh 07-31-21, 12:17 AM

## 2021-08-11 NOTE — Progress Notes (Signed)
Waisted 27mls of Morphine drip with Donnamarie Rossetti, RN in the stericycle.

## 2021-08-11 NOTE — Progress Notes (Signed)
Patient passed away peacefully at 1723 with family present at her bedside. This RN listened with Polly Cobia, RN for one minute to pronounce death and there were no breath or heart sounds. Her husband and family were notified at bedside and comforted.

## 2021-08-11 NOTE — Progress Notes (Addendum)
NAME:  Veronica [name], MRN:  [id number], DOB:  23-Jun-1971, LOS: 1 ADMISSION DATE:  08/01/2021,  REFERRING MD: Dr. Tomi Bamberger, CHIEF COMPLAINT: Shock  History of Present Illness:  50 year old woman, never smoker, with a history of breast cancer metastatic to liver 2022, bone, treated with lumpectomy, XRT, chemotherapy.  She started [place] just over 2 weeks ago for progression on liver MRI 06/2021.  She has had some abdominal pain since this was initiated, was in the ED for this on 10/7.  Underwent the second dose on 10/12.  Presented to the ED 10/19 with severe abdominal pain, nausea, vomiting.  Also starting to have some loose stools.  No blood reported.  Associated with some lightheadedness and weakness.  No fever, dyspnea.  In the ED found to be severely neutropenic, hypotensive with an associated acute renal insufficiency (SCr 0.64 > 1.53), and hyperglycemic.  She was given 2 L volume resuscitation and then required initiation of norepinephrine.  Initial lactate 5.6 > 4.3.  VBG was compensated.  CT abdomen pelvis 10/19 showed right colonic wall thickening with extensive pneumatosis at the cecum and proximal transverse colon.  Mildly dilated proximal small loops of small bowel.  Trace free fluid in the pelvis with some scattered locules of free intraperitoneal air.  Appendix cannot be seen.  No significant vascular findings. CCS consulted and patient elected to defer surgery given risks.   Pertinent  Medical History  Breast Cancer - s/p chemo/XRT in 03/2020, 2022 with liver mets  Chronic low back pain  HTN Family hx of leukemia, stomach cancer  Significant Hospital Events: Including procedures, antibiotic start and stop dates in addition to other pertinent events   10/19 Admit with abdominal pain, n/v & loose stool 10/20 Transitioned to comfort care overnight  Interim History / Subjective:  Afebrile  Remains on levophed at 27 mcg's Noted transition to comfort care overnight > waiting on family  to arrive  Morphine gtt for pain / comfort Gram positive rods in anaerobic bottle only from blood cultures  Objective   Blood pressure (!) 124/39, pulse (!) 129, temperature 98.9 F (37.2 C), temperature source Oral, resp. rate (!) 26, height 5\' 2"  (1.575 m), weight 101.8 kg, SpO2 92 %.        Intake/Output Summary (Last 24 hours) at August 18, 2021 0715 Last data filed at August 18, 2021 0603 Gross per 24 hour  Intake 8626.31 ml  Output --  Net 8626.31 ml   Filed Weights   07/25/2021 0858 07/15/2021 1600  Weight: 97.5 kg 101.8 kg    Examination: General: adult female sitting up in bed in NAD, husband at bedside  HEENT: MM pink/dry, no jvd, anicteric  Neuro: AAOx4, speech clear, MAE  CV: s1s2 RRR, no m/r/g PULM: non-labored at rest, lungs bilaterally diminished with bibasilar crackles  GI: soft, bsx4 active  Extremities: warm/dry, no edema  Skin: no rashes or lesions  Resolved Hospital Problem list     Assessment & Plan:   Multifactorial Shock, presumed septic and hypovolemic shock.   Suspected source is colitis, likely this is neutropenic enterocolitis which can cause perforation, pneumatosis, etc. -IV hydration  -continue norepinephrine infusion until family can arrive, then will titrate down  -patient has elected to transition to full comfort care  -continue morphine infusion for pain / shortness of breath  -add PRN ativan for anxiety  -DNR /DNI -open visitation  -pending clinical course, may need hospice home vs home with hospice   Acute renal insufficiency  Due to ATN, hypoperfusion due  to shock state -no further lab draws / comfort measures  Hyperglycemia -diet as tolerated  -no further glucose sticks for comfort   Metastatic Breast Cancer -appreciate Dr. Virgie Dad assistance with patient care -no further interventions / supportive comfort care  Thrombocytopenia Likely due to chemotherapy, acute septic illness -no further labs  Nausea  -PRN zofran for comfort    Best Practice (right click and "Reselect all SmartList Selections" daily)  Diet/type: Regular consistency (see orders) DVT prophylaxis: not indicated GI prophylaxis: N/A Lines: N/A Foley:  N/A Code Status:  DNR Last date of multidisciplinary goals of care discussion - Code status addressed by Dr  Jana Hakim on 10/19 >> DNR/I confirmed.  Husband updated at bedside 10/20 on plan of care.   Critical care time:  n/a    Noe Gens, MSN, APRN, NP-C, AGACNP-BC [facility] 08/29/2021, 7:15 AM   Please see [email or web address] for pager details.   From 7A-7P if no response, please call [phone] After hours, please call ELink [phone]

## 2021-08-11 NOTE — Progress Notes (Signed)
[name]   DOB:[birth year]   CL#:[id number]   CBS#:[id number]  Subjective: [name] is alert, calm; tells me she still has pain, which "comes and goes," not passing gas, no BM, also no nausea or vomiting; husband in recliner  Objective: White woman examined in bed Vitals:   22-Aug-2021 0600 Aug 22, 2021 0700  BP: (!) 124/39 106/60  Pulse: (!) 129 (!) 129  Resp: (!) 26 13  Temp:    SpO2: 92% 91%    Body mass index is 41.05 kg/m.  Intake/Output Summary (Last 24 hours) at 2021/08/22 0735 Last data filed at 08-22-21 8466 Gross per 24 hour  Intake 8767.16 ml  Output --  Net 8767.16 ml     Lungs no rales or wheezes--auscultated anterolaterally  Heart regular rate and rhythm  Abdomen distended, no BS  Neuro nonfocal  Breast exam: deferred  CBG (last 3)  Recent Labs    08/01/2021 1844 07/22/2021 1917 08/08/2021 1959  GLUCAP 164* 137* 141*     Labs:  Lab Results  Component Value Date   WBC 1.6 (L) 07/28/2021   HGB 14.7 08/10/2021   HCT 44.1 07/28/2021   MCV 81.7 07/28/2021   PLT 141 (L) 07/23/2021   NEUTROABS 0.1 (LL) 08/02/2021    '@LASTCHEMISTRY' @  Urine Studies No results for input(s): UHGB, CRYS in the last 72 hours.  Invalid input(s): UACOL, UAPR, USPG, UPH, UTP, UGL, UKET, UBIL, UNIT, UROB, S[place], UEPI, UWBC, Duwayne Heck [place], [place]  Basic Metabolic Panel: Recent Labs  Lab 08/05/2021 0825 08/01/2021 1058 08/02/2021 1413 08/01/2021 1830  NA 129* 128* 133* 132*  K 3.9 4.0 3.7 4.0  CL 97* 100 104 104  CO2 18* 17* 15* 19*  GLUCOSE 509* 566* 242* 175*  BUN '16 17 18 20  ' CREATININE 1.38* 1.53* 1.36* 1.45*  CALCIUM 8.7* 7.7* 7.9* 7.9*   GFR Estimated Creatinine Clearance: 53 mL/min (A) (by C-G formula based on SCr of 1.45 mg/dL (H)). Liver Function Tests: Recent Labs  Lab 07/16/2021 0825  AST 64*  ALT 117*  ALKPHOS 899*  BILITOT 5.4*  PROT 6.2*  ALBUMIN 2.9*   No results for input(s): LIPASE, AMYLASE in the last 168 hours. No results for input(s): AMMONIA in the  last 168 hours. Coagulation profile Recent Labs  Lab 07/15/2021 0825  INR 1.8*    CBC: Recent Labs  Lab 08/05/2021 0852  WBC 1.6*  NEUTROABS 0.1*  HGB 14.7  HCT 44.1  MCV 81.7  PLT 141*   Cardiac Enzymes: No results for input(s): CKTOTAL, CKMB, CKMBINDEX, TROPONINI in the last 168 hours. BNP: Invalid input(s): POCBNP CBG: Recent Labs  Lab 07/17/2021 1659 07/28/2021 1806 08/10/2021 1844 07/27/2021 1917 07/21/2021 1959  GLUCAP 148* 149* 164* 137* 141*   D-Dimer No results for input(s): DDIMER in the last 72 hours. Hgb A1c Recent Labs    08/02/2021 1906  HGBA1C 9.7*   Lipid Profile No results for input(s): CHOL, HDL, LDLCALC, TRIG, CHOLHDL, LDLDIRECT in the last 72 hours. Thyroid function studies No results for input(s): TSH, T4TOTAL, T3FREE, THYROIDAB in the last 72 hours.  Invalid input(s): FREET3 Anemia work up No results for input(s): VITAMINB12, FOLATE, FERRITIN, TIBC, IRON, RETICCTPCT in the last 72 hours. Microbiology Recent Results (from the past 240 hour(s))  Culture, blood (Routine x 2)     Status: None (Preliminary result)   Collection Time: 08/07/2021  8:25 AM   Specimen: BLOOD  Result Value Ref Range Status   Specimen Description   Final    BLOOD  PORTA CATH Performed at [facility], [place] [address] [place], [place] [address]    Special Requests   Final    BOTTLES DRAWN AEROBIC AND ANAEROBIC Blood Culture results may not be optimal due to an excessive volume of blood received in culture bottles Performed at [place] [address], [place], [place] [address]    Culture  Setup Time   Final    GRAM POSITIVE RODS ANAEROBIC BOTTLE ONLY CRITICAL RESULT CALLED TO, READ BACK BY AND VERIFIED WITH: PHARMD ELLEN JACKSON Aug 09, 2021'@00' :10 BY TW  Performed at [facility], [place] [address] [place], [place] [address]    Culture PENDING  Incomplete   Report Status PENDING  Incomplete  Resp Panel by RT-PCR (Flu A&B, Covid)  Nasopharyngeal Swab     Status: None   Collection Time: 08/01/2021  8:55 AM   Specimen: Nasopharyngeal Swab; Nasopharyngeal(NP) swabs in vial transport medium  Result Value Ref Range Status   SARS Coronavirus 2 by RT PCR NEGATIVE NEGATIVE Final    Comment: (NOTE) SARS-CoV-2 target nucleic acids are NOT DETECTED.  The SARS-CoV-2 RNA is generally detectable in upper respiratory specimens during the acute phase of infection. The lowest concentration of SARS-CoV-2 viral copies this assay can detect is 138 copies/mL. A negative result does not preclude SARS-Cov-2 infection and should not be used as the sole basis for treatment or other patient management decisions. A negative result may occur with  improper specimen collection/handling, submission of specimen other than nasopharyngeal swab, presence of viral mutation(s) within the areas targeted by this assay, and inadequate number of viral copies(<138 copies/mL). A negative result must be combined with clinical observations, patient history, and epidemiological information. The expected result is Negative.  Fact Sheet for Patients:  EntrepreneurPulse.com.au  Fact Sheet for Healthcare Providers:  IncredibleEmployment.be  This test is no t yet approved or cleared by the [place] FDA and  has been authorized for detection and/or diagnosis of SARS-CoV-2 by FDA under an Emergency Use Authorization (EUA). This EUA will remain  in effect (meaning this test can be used) for the duration of the COVID-19 declaration under Section 564(b)(1) of the Act, 21 U.S.C.section 360bbb-3(b)(1), unless the authorization is terminated  or revoked sooner.       Influenza A by PCR NEGATIVE NEGATIVE Final   Influenza B by PCR NEGATIVE NEGATIVE Final    Comment: (NOTE) The Xpert Xpress SARS-CoV-2/FLU/RSV plus assay is intended as an aid in the diagnosis of influenza from Nasopharyngeal swab specimens and should not be  used as a sole basis for treatment. Nasal washings and aspirates are unacceptable for Xpert Xpress SARS-CoV-2/FLU/RSV testing.  Fact Sheet for Patients: EntrepreneurPulse.com.au  Fact Sheet for Healthcare Providers: IncredibleEmployment.be  This test is not yet approved or cleared by the [place] FDA and has been authorized for detection and/or diagnosis of SARS-CoV-2 by FDA under an Emergency Use Authorization (EUA). This EUA will remain in effect (meaning this test can be used) for the duration of the COVID-19 declaration under Section 564(b)(1) of the Act, 21 U.S.C. section 360bbb-3(b)(1), unless the authorization is terminated or revoked.  Performed at [facility], [place] [address], [place], [place] [address]   MRSA Next Gen by PCR, Nasal     Status: None   Collection Time: 07/28/2021  4:04 PM   Specimen: Nasal Mucosa; Nasal Swab  Result Value Ref Range Status   MRSA by PCR Next Gen NOT DETECTED NOT DETECTED Final  Comment: (NOTE) The GeneXpert MRSA Assay (FDA approved for NASAL specimens only), is one component of a comprehensive MRSA colonization surveillance program. It is not intended to diagnose MRSA infection nor to guide or monitor treatment for MRSA infections. Test performance is not FDA approved in patients less than 44 years old. Performed at [facility], [place] [address], [place], [place] [address]       Studies:  CT ABDOMEN PELVIS W CONTRAST  Result Date: 07/24/2021 CLINICAL DATA:  History of metastatic breast cancer EXAM: CT ABDOMEN AND PELVIS WITH CONTRAST TECHNIQUE: Multidetector CT imaging of the abdomen and pelvis was performed using the standard protocol following bolus administration of intravenous contrast. CONTRAST:  54m OMNIPAQUE IOHEXOL 350 MG/ML SOLN COMPARISON:  CT abdomen and pelvis dated July 17, 2021 FINDINGS: Lower chest: Small hiatal hernia.  No acute  abnormality. Hepatobiliary: Numerous ill-defined low-attenuation liver lesions are unchanged when compared with recent prior exam. New portal venous gas. Trace perihepatic fluid, new compared to prior exam. Cholecystectomy clips. No biliary ductal dilation. Pancreas: Unremarkable. No pancreatic ductal dilatation or surrounding inflammatory changes. Spleen: Normal in size without focal abnormality. Adrenals/Urinary Tract: Adrenal glands are unremarkable. Kidneys are normal, without renal calculi, focal lesion, or hydronephrosis. Bladder is unremarkable. Stomach/Bowel: Wall thickening of the right colon with extensive pneumatosis seen at the cecum and proximal transverse colon. Mildly dilated proximal small loops of small bowel, likely reactive. Appendix is not visualized. Vascular/Lymphatic: No significant vascular findings are present. No enlarged abdominal or pelvic lymph nodes. Reproductive: Status post hysterectomy. No adnexal masses. Other: Skin thickening of the right breast, unchanged compared to prior. Trace free fluid seen in the pelvis and about the liver. Scattered locules of free intraperitoneal air. Musculoskeletal: Unchanged scattered sclerotic lesions. IMPRESSION: New wall thickening of the right colon with extensive pneumatosis seen at the cecum and proximal transverse colon. There is associated portal venous gas and small locules of free intraperitoneal air. Findings are highly concerning for bowel ischemia. Dilated loops of proximal small bowel with gradual transition point near the area of transverse colon inflammation, findings are likely reactive. Unchanged liver and osseous metastatic disease. Critical Value/emergent results were called by telephone at the time of interpretation on 07/27/2021 at 12:53 pm to [facility], who verbally acknowledged these results. Electronically Signed   By: LYetta GlassmanM.D.   On: 08/10/2021 12:54   DG Chest Port 1 View  Result Date:  07/27/2021 CLINICAL DATA:  Sepsis. EXAM: PORTABLE CHEST 1 VIEW COMPARISON:  October 15, 2019. FINDINGS: The heart size and mediastinal contours are within normal limits. Both lungs are clear. Interval placement of left internal jugular Port-A-Cath with distal tip in expected position of cavoatrial junction. The visualized skeletal structures are unremarkable. IMPRESSION: No active disease. Electronically Signed   By: JMarijo ConceptionM.D.   On: 07/28/2021 09:34    Assessment: 50y.o. [place]  woman admitted with neutropenic enterocolitis in setting of stage 4 breast cancer, as follows:  (0) status post right breast upper outer quadrant biopsy 05/01/2019 for a clinical T1c N1, stage IIA invasive ductal carcinoma, grade 3, estrogen and progesterone receptor positive, HER-2 nonamplified, with an MIB-1-1 of 20%.             (a) mass in the axillary tail was a positive lymph node   (1) MammaPrint obtained from the original biopsy shows a high risk luminal B tumor and predicts a 5-year disease-free survival of 91% in her case   (2)  genetics testing 05/09/2019 through the Common Hereditary Gene Panel offered by [facility] found no deleterious mutations in APC, ATM, AXIN2, BARD1, BMPR1A, BRCA1, BRCA2, BRIP1, CDH1, CDK4, CDKN2A (p14ARF), CDKN2A (p16INK4a), CHEK2, CTNNA1, DICER1, EPCAM (Deletion/duplication testing only), GREM1 (promoter region deletion/duplication testing only), KIT, MEN1, MLH1, MSH2, MSH3, MSH6, MUTYH, NBN, NF1, NHTL1, PALB2, PDGFRA, PMS2, POLD1, POLE, PTEN, RAD50, RAD51C, RAD51D, RNF43, SDHB, SDHC, SDHD, SMAD4, SMARCA4. STK11, TP53, TSC1, TSC2, and VHL.  The following genes were evaluated for sequence changes only: SDHA and HOXB13 c.251G>A variant only.              (a) A variant of uncertain significance (VUS) was detected in one of her MSH6 genes (c.831A>C).   (3) status post right lumpectomy and sentinel lymph node sampling 06/12/2019 for a pT2 pN1, stage IIA invasive ductal carcinoma, grade  2, with positive margins             (a) a total of 4 sentinel lymph nodes removed, one positive (with ECE), ine itc             (b) margin clearance 04/19/2019 successful (medial margin close but negative for DCIS)   (4) adjuvant chemotherapy consisting of doxorubicin and cyclophosphamide in dose dense fashion x4 starting 07/10/2019, completed 09/24/2019; planned weekly paclitaxel x12 omitted              (a) echo 06/26/2019 shows an EF of 60-65%             (b echo on 09/19/2019 shows an EF of 60-65%             (c) chemotherapy stopped after four cycles of doxorubicin and cyclophosphamide due to #5 below   (5) Multiple VTE/PE documented 10/02/2019, on intravenous heparin initially, then Xarelto started 10/19/2019             (a) presented with SVC syndrome and bilateral pulmonary emboli on 09/28/2019             (b) s/p SVC thrombectomy and port removal 28/78/6767 complicated by hemopericardium and tamponade, necessitating pericardial window placement                 (c) postop course complicated by wound dehiscence requiring wound VAC             (c) Doppler 10/03/2019 showed right lower extremity DVT             (d) CT angio and Doppler of both upper extremities 10/13/2019 found persistent acute bilateral pulmonary emboli, persistent but improved SVC thrombus, bilateral pleural effusions, and right lower lobe collapse. Bilateral upper extremity DVTs also noted             (e) Dopplers upper extremity 11/19/2019 showed clots cleared on the left, chronic DVT right internal jugular and axillary veins             (f) CT angio of the chest 10/07/2020 shows resolution of earlier pleural effusions and no evidence of active embolism (but see #9 below)   (6) COVID-19 infection documented 09/27/2020, status post remdesivir   (7) adjuvant radiation 12/24/19 - 02/06/20  Site/dose:   The patient initially received a dose of 50.4 Gy in 28 fractions to the breast and SCLV region using a 4-field approach. This  was delivered using a 3-D conformal technique. The patient then received a boost to the seroma. This delivered an additional 10 Gy in 5 fractions using a 3-field photon boost technique. The total dose was 60.4 Gy.   (  8) anastrozole started mid May 2021             (a) [facility] and estradiol obtained 11/15/2019 consistent with menopause             (b) not a good candidate for tamoxifen given history of DVT/PE above             (c) bone density 07/31/2020 normal (T score equals 0.1).   METASTATIC DISEASE: Jan 2022 (9) CT angio of the chest 12/28/2021shows new liver lesions (not seen on CT angio January 2021)             (a) MRI 10/22/2020 shows multiple liver lesions, the largest 2.5 cm             (b)   liver biopsy 11/03/2020 confirms metastatic carcinoma estrogen receptor 10% weakly positive, HER-2 and progesterone receptor negative (functionally triple negative metastatic breast cancer).             (c) bone scan 11/25/2020 shows left femoral lesion as well as other lesions                         (i) left hip films not suggestive of impending pathologic fracture             (d) non-contrast head CT 11/25/2020 negative except for sinusitis             (e) CA 27-29 is informative (was 104.1 on 10/29/2020)   (10) foundation 1 study requested on 11/03/2020 liver biopsy dated 11/03/2020 shows PI K3 CA amplification, and a T p53 mutation.  There were no mutations in BRCA1 and BRCA2 or HER-2.  There is amplification of PRK CI, S0X2, T ERC, and FGF 1 2.  The microsatellite status is stable.  The tumor mutational burden is 4 mutations per Mb   (11) started paclitaxel 11/19/2020, repeated day 1 day 8 of every 21-day cycle, stopped after 04/23/2021 dose with continuing response             (a) pembrolizumab added 12/17/2020, repeated every 21 days, ongoing             (b) MRI liver 01/21/2021 (after 3 cycles) shows significant response             (c) repeat liver MRI 04/30/2021 shows essentially stable  disease             (d) liver MRI 06/30/2021 shows significantly worsened liver involvement --pembrolizumab stopped   (12) zoledronate added 12/10/2020, repeated every 12 weeks   (13) started sacituzumab/Trudelvy 07/15/2021, most recent dose 07/22/2021     Plan: Adrionna is currently day 2 cefepime/Zosyn, day 2 neupogen, day 16 cycle 1 Trudelvy. She is on comfort care and expects multiple family visits today.  Sanya tells me her pain is "all right" right now but also says it's a seven out of ten. She is calm, alert, and very interactive (turned on the light so I could look at her drips). Husband is in room anticipating the family visits.  Will write for CBC 10/21 and 10/23, can d/c neupogen when ANC > 1.5  Appreciate your help to this patient and her family! We will follow with you   Chauncey Cruel, MD 08-25-21  7:35 AM Medical Oncology and Hematology [facility] [address] [place], [place] [address] Tel. [phone]    Fax. [phone]

## 2021-08-11 NOTE — Progress Notes (Signed)
Chaplain visited patient and her husband Veronica [name], who was bedside holding patient's hand.  She was mostly alert but closed her eyes from time to time during visit.  She had little to say and Veronica [name] mostly spoke.  "This was a surprise to [place]" he said.  Noting that only yesterday did they learn that she was not able to recover and was moving to comfort care.  He said that the patient was from a large family, the youngest of 8 children. Her siblings who are nearby are coming for visits today.  "I want to be cremated" she said, acknowleging the gravity of her illness. Their two daughers, 18 and 24 are taking this hard. [name] and [place].  One of them was with her last night and the other one is coming in today. Chaplain offered presence and prayer and will be available for continuing support through the day. Rev. Tamsen Snider Pager [phone]

## 2021-08-11 NOTE — Progress Notes (Signed)
Patient ID: Veronica [name], female   DOB: [birth year], 50 y.o.   MRN: [id number] Comfort care noted Abx off Please call me if needed

## 2021-08-11 DEATH — deceased

## 2021-08-12 ENCOUNTER — Other Ambulatory Visit: Payer: 59

## 2021-08-12 ENCOUNTER — Ambulatory Visit: Payer: 59

## 2021-08-12 ENCOUNTER — Other Ambulatory Visit: Payer: Self-pay

## 2021-08-26 ENCOUNTER — Ambulatory Visit: Payer: 59

## 2021-08-26 ENCOUNTER — Other Ambulatory Visit: Payer: Self-pay

## 2021-08-26 ENCOUNTER — Ambulatory Visit: Payer: 59 | Admitting: Pharmacist

## 2021-08-26 ENCOUNTER — Other Ambulatory Visit: Payer: 59

## 2023-07-26 NOTE — Telephone Encounter (Signed)
TC
